# Patient Record
Sex: Female | Born: 1940
Health system: Southern US, Community
[De-identification: ages and names within clinical notes are randomized; demographics above are authoritative.]

## PROBLEM LIST (undated history)

## (undated) DIAGNOSIS — I82409 Acute embolism and thrombosis of unspecified deep veins of unspecified lower extremity: Secondary | ICD-10-CM

## (undated) DIAGNOSIS — H9190 Unspecified hearing loss, unspecified ear: Secondary | ICD-10-CM

## (undated) DIAGNOSIS — F800 Phonological disorder: Secondary | ICD-10-CM

## (undated) DIAGNOSIS — I69354 Hemiplegia and hemiparesis following cerebral infarction affecting left non-dominant side: Secondary | ICD-10-CM

## (undated) DIAGNOSIS — R42 Dizziness and giddiness: Secondary | ICD-10-CM

## (undated) DIAGNOSIS — I499 Cardiac arrhythmia, unspecified: Secondary | ICD-10-CM

## (undated) DIAGNOSIS — N3941 Urge incontinence: Secondary | ICD-10-CM

## (undated) DIAGNOSIS — I10 Essential (primary) hypertension: Secondary | ICD-10-CM

## (undated) DIAGNOSIS — G459 Transient cerebral ischemic attack, unspecified: Secondary | ICD-10-CM

## (undated) DIAGNOSIS — D649 Anemia, unspecified: Secondary | ICD-10-CM

## (undated) DIAGNOSIS — N950 Postmenopausal bleeding: Principal | ICD-10-CM

## (undated) DIAGNOSIS — I480 Paroxysmal atrial fibrillation: Secondary | ICD-10-CM

## (undated) DIAGNOSIS — G8929 Other chronic pain: Secondary | ICD-10-CM

## (undated) DIAGNOSIS — M549 Dorsalgia, unspecified: Secondary | ICD-10-CM

## (undated) DIAGNOSIS — M25569 Pain in unspecified knee: Secondary | ICD-10-CM

## (undated) DIAGNOSIS — M1990 Unspecified osteoarthritis, unspecified site: Secondary | ICD-10-CM

## (undated) DIAGNOSIS — R41841 Cognitive communication deficit: Secondary | ICD-10-CM

## (undated) DIAGNOSIS — I639 Cerebral infarction, unspecified: Secondary | ICD-10-CM

## (undated) DIAGNOSIS — E669 Obesity, unspecified: Secondary | ICD-10-CM

## (undated) DIAGNOSIS — R1310 Dysphagia, unspecified: Secondary | ICD-10-CM

## (undated) DIAGNOSIS — R9389 Abnormal findings on diagnostic imaging of other specified body structures: Secondary | ICD-10-CM

## (undated) DIAGNOSIS — E785 Hyperlipidemia, unspecified: Secondary | ICD-10-CM

## (undated) DIAGNOSIS — F0390 Unspecified dementia without behavioral disturbance: Secondary | ICD-10-CM

## (undated) HISTORY — PX: APPENDECTOMY: SHX54

## (undated) HISTORY — PX: OVARY SURGERY: SHX727

## (undated) HISTORY — DX: Abnormal findings on diagnostic imaging of other specified body structures: R93.89

## (undated) HISTORY — DX: Urge incontinence: N39.41

## (undated) HISTORY — DX: Cerebral infarction, unspecified: I63.9

## (undated) HISTORY — DX: Postmenopausal bleeding: N95.0

## (undated) HISTORY — PX: ENDOMETRIAL ABLATION: SHX621

## (undated) HISTORY — DX: Obesity, unspecified: E66.9

---

## 1999-08-15 ENCOUNTER — Encounter: Payer: Self-pay | Admitting: Emergency Medicine

## 1999-08-15 ENCOUNTER — Emergency Department (HOSPITAL_COMMUNITY): Admission: EM | Admit: 1999-08-15 | Discharge: 1999-08-15 | Payer: Self-pay | Admitting: Emergency Medicine

## 2001-03-14 ENCOUNTER — Emergency Department (HOSPITAL_COMMUNITY): Admission: EM | Admit: 2001-03-14 | Discharge: 2001-03-14 | Payer: Self-pay | Admitting: Emergency Medicine

## 2004-07-07 ENCOUNTER — Emergency Department (HOSPITAL_COMMUNITY): Admission: EM | Admit: 2004-07-07 | Discharge: 2004-07-07 | Payer: Self-pay

## 2005-12-30 ENCOUNTER — Emergency Department (HOSPITAL_COMMUNITY): Admission: EM | Admit: 2005-12-30 | Discharge: 2005-12-30 | Payer: Self-pay | Admitting: Emergency Medicine

## 2006-11-08 ENCOUNTER — Ambulatory Visit (HOSPITAL_COMMUNITY): Admission: RE | Admit: 2006-11-08 | Discharge: 2006-11-08 | Payer: Self-pay | Admitting: Obstetrics

## 2007-12-07 HISTORY — PX: OTHER SURGICAL HISTORY: SHX169

## 2007-12-20 ENCOUNTER — Ambulatory Visit: Payer: Self-pay | Admitting: Nurse Practitioner

## 2007-12-20 DIAGNOSIS — M1710 Unilateral primary osteoarthritis, unspecified knee: Secondary | ICD-10-CM | POA: Insufficient documentation

## 2007-12-20 DIAGNOSIS — IMO0002 Reserved for concepts with insufficient information to code with codable children: Secondary | ICD-10-CM

## 2007-12-20 DIAGNOSIS — I10 Essential (primary) hypertension: Secondary | ICD-10-CM | POA: Insufficient documentation

## 2007-12-20 DIAGNOSIS — D649 Anemia, unspecified: Secondary | ICD-10-CM | POA: Insufficient documentation

## 2007-12-20 DIAGNOSIS — J45909 Unspecified asthma, uncomplicated: Secondary | ICD-10-CM | POA: Insufficient documentation

## 2007-12-20 DIAGNOSIS — E118 Type 2 diabetes mellitus with unspecified complications: Secondary | ICD-10-CM | POA: Insufficient documentation

## 2007-12-20 LAB — CONVERTED CEMR LAB
Blood Glucose, Fingerstick: 109
Hgb A1c MFr Bld: 7 %

## 2007-12-21 LAB — CONVERTED CEMR LAB
ALT: 12 units/L (ref 0–35)
AST: 13 units/L (ref 0–37)
Albumin: 3.7 g/dL (ref 3.5–5.2)
Alkaline Phosphatase: 70 units/L (ref 39–117)
BUN: 22 mg/dL (ref 6–23)
Basophils Absolute: 0 10*3/uL (ref 0.0–0.1)
Basophils Relative: 0 % (ref 0–1)
CO2: 26 meq/L (ref 19–32)
Calcium: 8.9 mg/dL (ref 8.4–10.5)
Chloride: 104 meq/L (ref 96–112)
Creatinine, Ser: 1.14 mg/dL (ref 0.40–1.20)
Eosinophils Absolute: 0.1 10*3/uL (ref 0.0–0.7)
Eosinophils Relative: 2 % (ref 0–5)
Glucose, Bld: 68 mg/dL — ABNORMAL LOW (ref 70–99)
HCT: 34.2 % — ABNORMAL LOW (ref 36.0–46.0)
Hemoglobin: 10.3 g/dL — ABNORMAL LOW (ref 12.0–15.0)
Lymphocytes Relative: 25 % (ref 12–46)
Lymphs Abs: 1.7 10*3/uL (ref 0.7–4.0)
MCHC: 30.1 g/dL (ref 30.0–36.0)
MCV: 99.1 fL (ref 78.0–100.0)
Monocytes Absolute: 0.5 10*3/uL (ref 0.1–1.0)
Monocytes Relative: 8 % (ref 3–12)
Neutro Abs: 4.5 10*3/uL (ref 1.7–7.7)
Neutrophils Relative %: 65 % (ref 43–77)
Platelets: 281 10*3/uL (ref 150–400)
Potassium: 4.2 meq/L (ref 3.5–5.3)
RBC: 3.45 M/uL — ABNORMAL LOW (ref 3.87–5.11)
RDW: 16.2 % — ABNORMAL HIGH (ref 11.5–15.5)
Sodium: 143 meq/L (ref 135–145)
TSH: 1.457 microintl units/mL (ref 0.350–5.50)
Total Bilirubin: 0.3 mg/dL (ref 0.3–1.2)
Total Protein: 7 g/dL (ref 6.0–8.3)
WBC: 6.8 10*3/uL (ref 4.0–10.5)

## 2007-12-25 ENCOUNTER — Telehealth (INDEPENDENT_AMBULATORY_CARE_PROVIDER_SITE_OTHER): Payer: Self-pay | Admitting: Nurse Practitioner

## 2008-01-01 ENCOUNTER — Ambulatory Visit: Payer: Self-pay | Admitting: *Deleted

## 2008-01-03 ENCOUNTER — Ambulatory Visit: Payer: Self-pay | Admitting: Nurse Practitioner

## 2008-01-03 LAB — CONVERTED CEMR LAB: Blood Glucose, Fingerstick: 125

## 2008-01-04 LAB — CONVERTED CEMR LAB
Cholesterol: 264 mg/dL — ABNORMAL HIGH (ref 0–200)
HDL: 56 mg/dL (ref >39)
LDL Cholesterol: 188 mg/dL — ABNORMAL HIGH (ref 0–99)
Total CHOL/HDL Ratio: 4.7
Triglycerides: 100 mg/dL (ref <150)
VLDL: 20 mg/dL (ref 0–40)

## 2008-01-09 ENCOUNTER — Ambulatory Visit: Payer: Self-pay | Admitting: Nurse Practitioner

## 2008-01-18 ENCOUNTER — Ambulatory Visit (HOSPITAL_COMMUNITY): Admission: RE | Admit: 2008-01-18 | Discharge: 2008-01-18 | Payer: Self-pay | Admitting: Nurse Practitioner

## 2008-01-23 ENCOUNTER — Ambulatory Visit: Payer: Self-pay | Admitting: Nurse Practitioner

## 2008-02-02 ENCOUNTER — Ambulatory Visit: Payer: Self-pay | Admitting: Nurse Practitioner

## 2008-02-02 DIAGNOSIS — R809 Proteinuria, unspecified: Secondary | ICD-10-CM | POA: Insufficient documentation

## 2008-02-02 LAB — CONVERTED CEMR LAB
Blood Glucose, Fingerstick: 116
Microalb, Ur: 44.2 mg/dL — ABNORMAL HIGH (ref 0.00–1.89)
Protein, U semiquant: >=300
Specific Gravity, Urine: 1.02
Urobilinogen, UA: 0.2
pH: 6.5

## 2008-02-16 ENCOUNTER — Ambulatory Visit: Payer: Self-pay | Admitting: Nurse Practitioner

## 2008-02-16 LAB — CONVERTED CEMR LAB: Blood Glucose, Fingerstick: 109

## 2008-03-20 ENCOUNTER — Ambulatory Visit: Payer: Self-pay | Admitting: Nurse Practitioner

## 2008-03-20 LAB — CONVERTED CEMR LAB
Bilirubin Urine: NEGATIVE
Blood Glucose, Fingerstick: 144
Glucose, Urine, Semiquant: NEGATIVE
Hgb A1c MFr Bld: 6.4 %
Ketones, urine, test strip: NEGATIVE
Nitrite: NEGATIVE
Protein, U semiquant: 100
Specific Gravity, Urine: 1.025
Urobilinogen, UA: 0.2
pH: 6

## 2008-04-04 ENCOUNTER — Telehealth (INDEPENDENT_AMBULATORY_CARE_PROVIDER_SITE_OTHER): Payer: Self-pay | Admitting: Nurse Practitioner

## 2008-04-24 ENCOUNTER — Encounter (INDEPENDENT_AMBULATORY_CARE_PROVIDER_SITE_OTHER): Payer: Self-pay | Admitting: Nurse Practitioner

## 2008-05-01 ENCOUNTER — Ambulatory Visit: Payer: Self-pay | Admitting: Nurse Practitioner

## 2008-05-02 LAB — CONVERTED CEMR LAB
ALT: 13 units/L (ref 0–35)
AST: 15 units/L (ref 0–37)
Albumin: 3.9 g/dL (ref 3.5–5.2)
Alkaline Phosphatase: 63 units/L (ref 39–117)
BUN: 19 mg/dL (ref 6–23)
Basophils Absolute: 0 10*3/uL (ref 0.0–0.1)
Basophils Relative: 0 % (ref 0–1)
CO2: 25 meq/L (ref 19–32)
Calcium: 8.7 mg/dL (ref 8.4–10.5)
Chloride: 105 meq/L (ref 96–112)
Cholesterol: 233 mg/dL — ABNORMAL HIGH (ref 0–200)
Creatinine, Ser: 0.97 mg/dL (ref 0.40–1.20)
Eosinophils Absolute: 0.1 10*3/uL (ref 0.0–0.7)
Eosinophils Relative: 2 % (ref 0–5)
Glucose, Bld: 109 mg/dL — ABNORMAL HIGH (ref 70–99)
HCT: 36.3 % (ref 36.0–46.0)
HDL: 71 mg/dL (ref >39)
Hemoglobin: 11.2 g/dL — ABNORMAL LOW (ref 12.0–15.0)
LDL Cholesterol: 147 mg/dL — ABNORMAL HIGH (ref 0–99)
Lymphocytes Relative: 34 % (ref 12–46)
Lymphs Abs: 1.5 10*3/uL (ref 0.7–4.0)
MCHC: 30.9 g/dL (ref 30.0–36.0)
MCV: 101.7 fL — ABNORMAL HIGH (ref 78.0–100.0)
Monocytes Absolute: 0.3 10*3/uL (ref 0.1–1.0)
Monocytes Relative: 7 % (ref 3–12)
Neutro Abs: 2.6 10*3/uL (ref 1.7–7.7)
Neutrophils Relative %: 57 % (ref 43–77)
Platelets: 226 10*3/uL (ref 150–400)
Potassium: 4.4 meq/L (ref 3.5–5.3)
RBC: 3.57 M/uL — ABNORMAL LOW (ref 3.87–5.11)
RDW: 15.1 % (ref 11.5–15.5)
Sodium: 141 meq/L (ref 135–145)
Total Bilirubin: 0.3 mg/dL (ref 0.3–1.2)
Total CHOL/HDL Ratio: 3.3
Total Protein: 6.9 g/dL (ref 6.0–8.3)
Triglycerides: 74 mg/dL (ref <150)
VLDL: 15 mg/dL (ref 0–40)
WBC: 4.5 10*3/uL (ref 4.0–10.5)

## 2008-06-20 ENCOUNTER — Telehealth (INDEPENDENT_AMBULATORY_CARE_PROVIDER_SITE_OTHER): Payer: Self-pay | Admitting: *Deleted

## 2008-07-04 ENCOUNTER — Ambulatory Visit: Payer: Self-pay | Admitting: Nurse Practitioner

## 2008-07-04 DIAGNOSIS — S4360XA Sprain of unspecified sternoclavicular joint, initial encounter: Secondary | ICD-10-CM | POA: Insufficient documentation

## 2008-07-04 LAB — CONVERTED CEMR LAB: Blood Glucose, Fingerstick: 121

## 2008-07-31 ENCOUNTER — Telehealth (INDEPENDENT_AMBULATORY_CARE_PROVIDER_SITE_OTHER): Payer: Self-pay | Admitting: Nurse Practitioner

## 2008-08-01 ENCOUNTER — Inpatient Hospital Stay (HOSPITAL_COMMUNITY): Admission: EM | Admit: 2008-08-01 | Discharge: 2008-08-04 | Payer: Self-pay | Admitting: Emergency Medicine

## 2008-08-02 ENCOUNTER — Telehealth (INDEPENDENT_AMBULATORY_CARE_PROVIDER_SITE_OTHER): Payer: Self-pay | Admitting: Nurse Practitioner

## 2008-08-04 ENCOUNTER — Encounter (INDEPENDENT_AMBULATORY_CARE_PROVIDER_SITE_OTHER): Payer: Self-pay | Admitting: Nurse Practitioner

## 2008-08-13 ENCOUNTER — Telehealth (INDEPENDENT_AMBULATORY_CARE_PROVIDER_SITE_OTHER): Payer: Self-pay | Admitting: Nurse Practitioner

## 2008-08-21 ENCOUNTER — Ambulatory Visit: Payer: Self-pay | Admitting: Nurse Practitioner

## 2008-08-21 DIAGNOSIS — M25519 Pain in unspecified shoulder: Secondary | ICD-10-CM | POA: Insufficient documentation

## 2008-08-21 DIAGNOSIS — E119 Type 2 diabetes mellitus without complications: Secondary | ICD-10-CM | POA: Insufficient documentation

## 2008-08-21 LAB — CONVERTED CEMR LAB
ALT: 20 units/L (ref 0–35)
AST: 19 units/L (ref 0–37)
Albumin: 3.9 g/dL (ref 3.5–5.2)
Alkaline Phosphatase: 65 units/L (ref 39–117)
BUN: 24 mg/dL — ABNORMAL HIGH (ref 6–23)
Basophils Absolute: 0 10*3/uL (ref 0.0–0.1)
Basophils Relative: 0 % (ref 0–1)
Blood Glucose, Fingerstick: 224
CO2: 26 meq/L (ref 19–32)
Calcium: 8.8 mg/dL (ref 8.4–10.5)
Chloride: 107 meq/L (ref 96–112)
Creatinine, Ser: 1.03 mg/dL (ref 0.40–1.20)
Eosinophils Absolute: 0.1 10*3/uL (ref 0.0–0.7)
Eosinophils Relative: 3 % (ref 0–5)
Glucose, Bld: 117 mg/dL — ABNORMAL HIGH (ref 70–99)
HCT: 35.7 % — ABNORMAL LOW (ref 36.0–46.0)
Hemoglobin: 10.7 g/dL — ABNORMAL LOW (ref 12.0–15.0)
Hgb A1c MFr Bld: 6.5 %
Lymphocytes Relative: 44 % (ref 12–46)
Lymphs Abs: 1.3 10*3/uL (ref 0.7–4.0)
MCHC: 30 g/dL (ref 30.0–36.0)
MCV: 101.7 fL — ABNORMAL HIGH (ref 78.0–100.0)
Monocytes Absolute: 0.4 10*3/uL (ref 0.1–1.0)
Monocytes Relative: 14 % — ABNORMAL HIGH (ref 3–12)
Neutro Abs: 1.2 10*3/uL — ABNORMAL LOW (ref 1.7–7.7)
Neutrophils Relative %: 40 % — ABNORMAL LOW (ref 43–77)
Platelets: 268 10*3/uL (ref 150–400)
Potassium: 4.9 meq/L (ref 3.5–5.3)
RBC: 3.51 M/uL — ABNORMAL LOW (ref 3.87–5.11)
RDW: 14.9 % (ref 11.5–15.5)
Sodium: 145 meq/L (ref 135–145)
Total Bilirubin: 0.3 mg/dL (ref 0.3–1.2)
Total Protein: 7.2 g/dL (ref 6.0–8.3)
WBC: 2.9 10*3/uL — ABNORMAL LOW (ref 4.0–10.5)

## 2008-08-22 ENCOUNTER — Encounter (INDEPENDENT_AMBULATORY_CARE_PROVIDER_SITE_OTHER): Payer: Self-pay | Admitting: Nurse Practitioner

## 2008-08-22 LAB — CONVERTED CEMR LAB: Retic Ct Pct: 1.6 % (ref 0.4–3.1)

## 2008-08-28 ENCOUNTER — Ambulatory Visit (HOSPITAL_COMMUNITY): Admission: RE | Admit: 2008-08-28 | Discharge: 2008-08-28 | Payer: Self-pay | Admitting: Family Medicine

## 2008-08-28 DIAGNOSIS — M719 Bursopathy, unspecified: Secondary | ICD-10-CM

## 2008-08-28 DIAGNOSIS — M67919 Unspecified disorder of synovium and tendon, unspecified shoulder: Secondary | ICD-10-CM | POA: Insufficient documentation

## 2008-09-04 ENCOUNTER — Ambulatory Visit: Payer: Self-pay | Admitting: Nurse Practitioner

## 2008-09-04 LAB — CONVERTED CEMR LAB: Blood Glucose, Fingerstick: 85

## 2008-09-06 ENCOUNTER — Telehealth (INDEPENDENT_AMBULATORY_CARE_PROVIDER_SITE_OTHER): Payer: Self-pay | Admitting: Nurse Practitioner

## 2008-09-12 ENCOUNTER — Encounter (INDEPENDENT_AMBULATORY_CARE_PROVIDER_SITE_OTHER): Payer: Self-pay | Admitting: Nurse Practitioner

## 2008-09-16 ENCOUNTER — Encounter (INDEPENDENT_AMBULATORY_CARE_PROVIDER_SITE_OTHER): Payer: Self-pay | Admitting: Nurse Practitioner

## 2008-09-16 ENCOUNTER — Encounter: Admission: RE | Admit: 2008-09-16 | Discharge: 2008-10-10 | Payer: Self-pay | Admitting: Nurse Practitioner

## 2008-09-18 ENCOUNTER — Ambulatory Visit: Payer: Self-pay | Admitting: Nurse Practitioner

## 2008-10-14 ENCOUNTER — Encounter (INDEPENDENT_AMBULATORY_CARE_PROVIDER_SITE_OTHER): Payer: Self-pay | Admitting: Nurse Practitioner

## 2008-10-15 ENCOUNTER — Encounter (INDEPENDENT_AMBULATORY_CARE_PROVIDER_SITE_OTHER): Payer: Self-pay | Admitting: Nurse Practitioner

## 2008-10-25 ENCOUNTER — Encounter (INDEPENDENT_AMBULATORY_CARE_PROVIDER_SITE_OTHER): Payer: Self-pay | Admitting: Nurse Practitioner

## 2008-11-02 ENCOUNTER — Emergency Department (HOSPITAL_COMMUNITY): Admission: EM | Admit: 2008-11-02 | Discharge: 2008-11-02 | Payer: Self-pay | Admitting: Emergency Medicine

## 2008-11-04 ENCOUNTER — Telehealth (INDEPENDENT_AMBULATORY_CARE_PROVIDER_SITE_OTHER): Payer: Self-pay | Admitting: Nurse Practitioner

## 2008-11-07 ENCOUNTER — Ambulatory Visit: Payer: Self-pay | Admitting: Nurse Practitioner

## 2008-11-07 DIAGNOSIS — B029 Zoster without complications: Secondary | ICD-10-CM | POA: Insufficient documentation

## 2008-11-07 LAB — CONVERTED CEMR LAB
Basophils Absolute: 0 10*3/uL (ref 0.0–0.1)
Basophils Relative: 0 % (ref 0–1)
Blood Glucose, Fingerstick: 150
Eosinophils Absolute: 0.1 10*3/uL (ref 0.0–0.7)
Eosinophils Relative: 1 % (ref 0–5)
HCT: 37.6 % (ref 36.0–46.0)
Hemoglobin: 11.5 g/dL — ABNORMAL LOW (ref 12.0–15.0)
Hgb A1c MFr Bld: 5.7 %
Lymphocytes Relative: 34 % (ref 12–46)
Lymphs Abs: 1.5 10*3/uL (ref 0.7–4.0)
MCHC: 30.6 g/dL (ref 30.0–36.0)
MCV: 100.3 fL — ABNORMAL HIGH (ref 78.0–100.0)
Monocytes Absolute: 0.4 10*3/uL (ref 0.1–1.0)
Monocytes Relative: 9 % (ref 3–12)
Neutro Abs: 2.5 10*3/uL (ref 1.7–7.7)
Neutrophils Relative %: 56 % (ref 43–77)
Platelets: 243 10*3/uL (ref 150–400)
RBC: 3.75 M/uL — ABNORMAL LOW (ref 3.87–5.11)
RDW: 14.8 % (ref 11.5–15.5)
WBC: 4.5 10*3/uL (ref 4.0–10.5)

## 2008-11-12 ENCOUNTER — Encounter (INDEPENDENT_AMBULATORY_CARE_PROVIDER_SITE_OTHER): Payer: Self-pay | Admitting: Nurse Practitioner

## 2009-02-04 ENCOUNTER — Telehealth (INDEPENDENT_AMBULATORY_CARE_PROVIDER_SITE_OTHER): Payer: Self-pay | Admitting: Nurse Practitioner

## 2009-02-12 ENCOUNTER — Ambulatory Visit: Payer: Self-pay | Admitting: Nurse Practitioner

## 2009-02-12 LAB — CONVERTED CEMR LAB
Blood Glucose, Fingerstick: 113
Cholesterol, target level: 200 mg/dL
HDL goal, serum: 40 mg/dL
Hgb A1c MFr Bld: 6.5 %
LDL Goal: 100 mg/dL

## 2009-02-26 ENCOUNTER — Ambulatory Visit: Payer: Self-pay | Admitting: Nurse Practitioner

## 2009-02-26 LAB — CONVERTED CEMR LAB
Bilirubin Urine: NEGATIVE
Blood in Urine, dipstick: NEGATIVE
Glucose, Urine, Semiquant: NEGATIVE
Ketones, urine, test strip: NEGATIVE
Nitrite: NEGATIVE
Protein, U semiquant: 100
Specific Gravity, Urine: 1.02
Urobilinogen, UA: 1
WBC Urine, dipstick: NEGATIVE
pH: 7

## 2009-02-27 ENCOUNTER — Encounter (INDEPENDENT_AMBULATORY_CARE_PROVIDER_SITE_OTHER): Payer: Self-pay | Admitting: Nurse Practitioner

## 2009-02-27 LAB — CONVERTED CEMR LAB
ALT: 17 units/L (ref 0–35)
AST: 17 units/L (ref 0–37)
Albumin: 3.9 g/dL (ref 3.5–5.2)
Alkaline Phosphatase: 71 units/L (ref 39–117)
BUN: 17 mg/dL (ref 6–23)
Basophils Absolute: 0 10*3/uL (ref 0.0–0.1)
Basophils Relative: 0 % (ref 0–1)
CO2: 26 meq/L (ref 19–32)
Calcium: 8.8 mg/dL (ref 8.4–10.5)
Chloride: 104 meq/L (ref 96–112)
Cholesterol: 186 mg/dL (ref 0–200)
Creatinine, Ser: 0.89 mg/dL (ref 0.40–1.20)
Eosinophils Absolute: 0.1 10*3/uL (ref 0.0–0.7)
Eosinophils Relative: 1 % (ref 0–5)
Glucose, Bld: 78 mg/dL (ref 70–99)
HCT: 38.9 % (ref 36.0–46.0)
HDL: 76 mg/dL (ref >39)
Hemoglobin: 12 g/dL (ref 12.0–15.0)
LDL Cholesterol: 97 mg/dL (ref 0–99)
Lymphocytes Relative: 32 % (ref 12–46)
Lymphs Abs: 1.3 10*3/uL (ref 0.7–4.0)
MCHC: 30.8 g/dL (ref 30.0–36.0)
MCV: 104.3 fL — ABNORMAL HIGH (ref 78.0–100.0)
Microalb, Ur: 18.53 mg/dL — ABNORMAL HIGH (ref 0.00–1.89)
Monocytes Absolute: 0.3 10*3/uL (ref 0.1–1.0)
Monocytes Relative: 8 % (ref 3–12)
Neutro Abs: 2.5 10*3/uL (ref 1.7–7.7)
Neutrophils Relative %: 59 % (ref 43–77)
Platelets: 230 10*3/uL (ref 150–400)
Potassium: 4.3 meq/L (ref 3.5–5.3)
RBC: 3.73 M/uL — ABNORMAL LOW (ref 3.87–5.11)
RDW: 14.4 % (ref 11.5–15.5)
Sodium: 141 meq/L (ref 135–145)
Total Bilirubin: 0.4 mg/dL (ref 0.3–1.2)
Total CHOL/HDL Ratio: 2.4
Total Protein: 6.5 g/dL (ref 6.0–8.3)
Triglycerides: 64 mg/dL (ref <150)
VLDL: 13 mg/dL (ref 0–40)
WBC: 4.2 10*3/uL (ref 4.0–10.5)

## 2009-05-09 ENCOUNTER — Ambulatory Visit: Payer: Self-pay | Admitting: Nurse Practitioner

## 2009-05-09 DIAGNOSIS — M21619 Bunion of unspecified foot: Secondary | ICD-10-CM | POA: Insufficient documentation

## 2009-05-09 DIAGNOSIS — E7800 Pure hypercholesterolemia, unspecified: Secondary | ICD-10-CM | POA: Insufficient documentation

## 2009-05-09 DIAGNOSIS — J329 Chronic sinusitis, unspecified: Secondary | ICD-10-CM | POA: Insufficient documentation

## 2009-05-09 LAB — CONVERTED CEMR LAB
Basophils Absolute: 0 10*3/uL (ref 0.0–0.1)
Basophils Relative: 0 % (ref 0–1)
Blood Glucose, Fingerstick: 106
Eosinophils Absolute: 0.1 10*3/uL (ref 0.0–0.7)
Eosinophils Relative: 3 % (ref 0–5)
HCT: 40.5 % (ref 36.0–46.0)
Hemoglobin: 13.1 g/dL (ref 12.0–15.0)
Lymphocytes Relative: 30 % (ref 12–46)
Lymphs Abs: 1.4 10*3/uL (ref 0.7–4.0)
MCHC: 32.3 g/dL (ref 30.0–36.0)
MCV: 99.5 fL (ref 78.0–100.0)
Monocytes Absolute: 0.4 10*3/uL (ref 0.1–1.0)
Monocytes Relative: 9 % (ref 3–12)
Neutro Abs: 2.6 10*3/uL (ref 1.7–7.7)
Neutrophils Relative %: 58 % (ref 43–77)
Platelets: 195 10*3/uL (ref 150–400)
RBC: 4.07 M/uL (ref 3.87–5.11)
RDW: 13.2 % (ref 11.5–15.5)
WBC: 4.5 10*3/uL (ref 4.0–10.5)

## 2009-05-12 ENCOUNTER — Encounter (INDEPENDENT_AMBULATORY_CARE_PROVIDER_SITE_OTHER): Payer: Self-pay | Admitting: Nurse Practitioner

## 2009-05-13 ENCOUNTER — Encounter (INDEPENDENT_AMBULATORY_CARE_PROVIDER_SITE_OTHER): Payer: Self-pay | Admitting: Nurse Practitioner

## 2009-05-14 ENCOUNTER — Ambulatory Visit: Payer: Self-pay | Admitting: Nurse Practitioner

## 2009-05-15 LAB — CONVERTED CEMR LAB
Cholesterol: 241 mg/dL — ABNORMAL HIGH (ref 0–200)
HDL: 71 mg/dL (ref >39)
LDL Cholesterol: 155 mg/dL — ABNORMAL HIGH (ref 0–99)
Total CHOL/HDL Ratio: 3.4
Triglycerides: 75 mg/dL (ref <150)
VLDL: 15 mg/dL (ref 0–40)

## 2009-05-19 ENCOUNTER — Telehealth (INDEPENDENT_AMBULATORY_CARE_PROVIDER_SITE_OTHER): Payer: Self-pay | Admitting: Nurse Practitioner

## 2010-03-17 ENCOUNTER — Inpatient Hospital Stay (HOSPITAL_COMMUNITY): Admission: EM | Admit: 2010-03-17 | Discharge: 2010-03-22 | Payer: Self-pay | Admitting: Emergency Medicine

## 2010-03-17 ENCOUNTER — Ambulatory Visit: Payer: Self-pay | Admitting: Cardiovascular Disease

## 2010-03-18 ENCOUNTER — Encounter (INDEPENDENT_AMBULATORY_CARE_PROVIDER_SITE_OTHER): Payer: Self-pay | Admitting: Internal Medicine

## 2010-03-18 ENCOUNTER — Ambulatory Visit: Payer: Self-pay | Admitting: Internal Medicine

## 2010-04-01 ENCOUNTER — Encounter: Payer: Self-pay | Admitting: Internal Medicine

## 2010-04-02 ENCOUNTER — Ambulatory Visit: Admission: RE | Admit: 2010-04-02 | Discharge: 2010-04-02 | Payer: Self-pay | Admitting: Neurology

## 2010-08-07 ENCOUNTER — Telehealth (INDEPENDENT_AMBULATORY_CARE_PROVIDER_SITE_OTHER): Payer: Self-pay | Admitting: Nurse Practitioner

## 2010-08-12 ENCOUNTER — Emergency Department (HOSPITAL_COMMUNITY): Admission: EM | Admit: 2010-08-12 | Discharge: 2010-08-12 | Payer: Self-pay | Admitting: Emergency Medicine

## 2010-12-27 ENCOUNTER — Encounter: Payer: Self-pay | Admitting: Obstetrics

## 2011-01-05 NOTE — Letter (Signed)
Summary: DIABETIC TESTING SUPPLIES  DIABETIC TESTING SUPPLIES   Imported By: Arta Bruce 04/30/2008 16:45:54  _____________________________________________________________________  External Attachment:    Type:   Image     Comment:   External Document

## 2011-01-05 NOTE — Progress Notes (Signed)
Summary: NEEDS SOMETHING FOR PAIN  Phone Note Call from Patient Call back at Home Phone [phone]   Reason for Call: Refill Medication Summary of Call: [name] PT. [name] WANT S TO KNOW IF YOU WOULD CALL IN SOMETHING FOR HER LEGS FOR PAIN. SHE SAYS SHE CAN HARDLY WALK. THE PAIN MEDICATION THAT SHE WANTS A REFILL ON, CAME FROM THE DR SHE SAW IN [place], ITS CALLED MELOXICAM 15mg . [name] SAYS OR IF YOU CAN GIVE HER SOMETHING UNTIL SHE SEES YOU ON THE 10th. SHE USES [facility] ON [address]. Initial call taken by: Leodis Rains,  February 04, 2009 8:54 AM  Follow-up for Phone Call        forward to N.Daphine Deutscher, FNP Follow-up by: Levon Hedger,  February 04, 2009 11:14 AM  Additional Follow-up for Phone Call Additional follow up Details #1::        Meloxicam sent to the pharmacy. Additional Follow-up by: Lehman Prom FNP,  February 04, 2009 11:28 AM    Additional Follow-up for Phone Call Additional follow up Details #2::    pt into the office today. will review meds with the pt Follow-up by: Lehman Prom FNP,  February 12, 2009 9:27 AM  New/Updated Medications: MELOXICAM 15 MG TABS (MELOXICAM) One tablet by mouth daily for pain   Prescriptions: MELOXICAM 15 MG TABS (MELOXICAM) One tablet by mouth daily for pain  #30 x 3   Entered and Authorized by:   Lehman Prom FNP   Signed by:   Lehman Prom FNP on 02/04/2009   Method used:   Electronically to        [facility] [phone] (retail)       [address]       [place], [place]  [address]       Ph: [phone]       Fax: [phone]   RxID:   [id number]

## 2011-01-05 NOTE — Letter (Signed)
Summary: INFLUENZA,VACCINATION  INFLUENZA,VACCINATION   Imported By: Arta Bruce 12/21/2007 10:21:09  _____________________________________________________________________  External Attachment:    Type:   Image     Comment:   External Document

## 2011-01-05 NOTE — Assessment & Plan Note (Signed)
Summary: Diabetes/HTN   Vital Signs:  Patient profile:   70 year old female Height:      58 inches Weight:      298 pounds BMI:     62.51 BSA:     2.16 Temp:     97.6 degrees F oral Pulse rate:   64 / minute Pulse rhythm:   regular Resp:     20 per minute BP sitting:   122 / 80  (left arm) Cuff size:   large  Vitals Entered By: Levon Hedger (February 12, 2009 8:56 AM) CC: Lipid Management Is Patient Diabetic? Yes  CBG Result 113 CBG Device ID B  Does patient need assistance? Functional Status Self care Ambulation Normal   History of Present Illness:  Pt into the office for follow up.  Diabetes - Checking blood sugar at least once per day.  Tobacco - pt had quit but she has restarted. She would like to quit again and reports that she will.    Hypertension History:      She denies headache, chest pain, and palpitations.  Further comments include: pt is taking her blood pressure medications as ordered.        Positive major cardiovascular risk factors include female age 68 years old or older, diabetes, and hypertension.  Negative major cardiovascular risk factors include non-tobacco-user status.        Further assessment for target organ damage reveals no history of ASHD, cardiac end-organ damage (CHF/LVH), stroke/TIA, peripheral vascular disease, renal insufficiency, or hypertensive retinopathy.    Lipid Management History:      Positive NCEP/ATP III risk factors include female age 68 years old or older, diabetes, and hypertension.  Negative NCEP/ATP III risk factors include no history of early menopause without estrogen hormone replacement, HDL cholesterol greater than 60, non-tobacco-user status, no ASHD (atherosclerotic heart disease), no prior stroke/TIA, no peripheral vascular disease, and no history of aortic aneurysm.        She expresses no side effects from her lipid-lowering medication.  The patient denies any symptoms to suggest myopathy or liver disease.   Comments: pt has not been taking her cholesterol medications as she completed her supply.     Medications Prior to Update: 1)  Janumet 50-500 Mg  Tabs (Sitagliptin-Metformin Hcl) .Marland Kitchen.. 1 Tablet By Mouth Twice Daily For Blood Sugar 2)  Ultram 50 Mg  Tabs (Tramadol Hcl) .Marland Kitchen.. 1 Tablet By Mouth Two Times A Day For Pain As Needed 3)  Proventil Hfa 108 (90 Base) Mcg/act  Aers (Albuterol Sulfate) .... 2 Puffs Every 6 Hours As Needed For Shortness of Breath 4)  Glucometer Elite Classic   Kit (Blood Glucose Monitoring Suppl) .... Dispense Glucometer (Whatever Available) Along With Test Strips, and Lancets.  Dx 250.00 Check Blood Sugar Once Daily. 5)  Norvasc 10 Mg  Tabs (Amlodipine Besylate) .Marland Kitchen.. 1 Tablet By Mouth Daily For Blood Pressure 6)  Aspirin 81 Mg  Tbec (Aspirin) .Marland Kitchen.. 1 Tablet By Mouth Daily 7)  Lipitor 20 Mg  Tabs (Atorvastatin Calcium) .Marland Kitchen.. 1 Tablet By Mouth At Night For Cholesterol 8)  Flexeril 5 Mg  Tabs (Cyclobenzaprine Hcl) .Marland Kitchen.. 1 Tablet By Mouth Nightly As Needed For Muscles 9)  Flector 1.3 %  Ptch (Diclofenac Epolamine) .... Apply 1 Patch To Affected Area On For 12 Hours Then Off For 12 Hours 10)  Benicar 20 Mg Tabs (Olmesartan Medoxomil) .Marland Kitchen.. 1 Tablet By Mouth Daily For Blood Pressure 11)  Ferrous Sulfate 325 (65 Fe) Mg Tbec (Ferrous  Sulfate) .... Take One Tablet Twice A Day 12)  Folic Acid 1 Mg Tabs (Folic Acid) .... Take One Tablet By Mouth Every Day 13)  Colace 100 Mg Caps (Docusate Sodium) .... Take One Tablet Two Time A Day 14)  Acyclovir 800 Mg Tabs (Acyclovir) .... Take One Tablet By Mouth Every 6hours For 7 Days 15)  Sarna Ultra 1-0.5-30 % Crea (Pramoxine-Menthol-Petrolatum) 16)  Meloxicam 15 Mg Tabs (Meloxicam) .... One Tablet By Mouth Daily For Pain  Allergies (verified): No Known Drug Allergies  Review of Systems CV:  Denies chest pain or discomfort. Resp:  Denies cough. GI:  Denies abdominal pain, nausea, and vomiting. MS:  Complains of joint pain; left knee pain -  improved since she restarted taking the meloxicam.  Physical Exam  General:  alert.   Head:  normocephalic.   Eyes:  glasses Lungs:  normal breath sounds.   Heart:  normal rate and regular rhythm.   Abdomen:  obese Msk:  left knee with decreased ROM Pulses:  DP +2 Extremities:  no edema Neurologic:  alert & oriented X3.    Diabetes Management Exam:    Foot Exam (with socks and/or shoes not present):       Sensory-Monofilament:          Left foot: normal          Right foot: normal   Impression & Recommendations:  Problem # 1:  DIABETES MELLITUS, CONTROLLED (ICD-250.00) advised pt to get eye exam hgba1c is 6.5 today which is higher than previous check advised pt to monitor diet and to increase activity Her updated medication list for this problem includes:    Janumet 50-500 Mg Tabs (Sitagliptin-metformin hcl) .Marland Kitchen... 1 tablet by mouth twice daily for blood sugar    Aspirin 81 Mg Tbec (Aspirin) .Marland Kitchen... 1 tablet by mouth daily    Benicar 20 Mg Tabs (Olmesartan medoxomil) .Marland Kitchen... 1 tablet by mouth daily for blood pressure  Orders: Capillary Blood Glucose (82948) Fingerstick (16[address])  Problem # 2:  ESSENTIAL HYPERTENSION, BENIGN (ICD-401.1) Pt has NOT been taking the norvasc in the past 2 weeks. BP is stable today advised her to restart the benicar and hold norvasc Her updated medication list for this problem includes:    Norvasc 10 Mg Tabs (Amlodipine besylate) .Marland Kitchen... 1 tablet by mouth daily for blood pressure    Benicar 20 Mg Tabs (Olmesartan medoxomil) .Marland Kitchen... 1 tablet by mouth daily for blood pressure  Problem # 3:  ANEMIA (ICD-285.9) will check cbc on the next visit pt has NOT been taking her iron tablet. advised her to restart Her updated medication list for this problem includes:    Ferrous Sulfate 325 (65 Fe) Mg Tbec (Ferrous sulfate) .Marland Kitchen... Take one tablet twice a day    Folic Acid 1 Mg Tabs (Folic acid) .Marland Kitchen... Take one tablet by mouth every day  Problem # 4:  ASTHMA  (ICD-493.90) advised pt that smoking will increase the symptoms she reports that she will quit again Her updated medication list for this problem includes:    Proventil Hfa 108 (90 Base) Mcg/act Aers (Albuterol sulfate) .Marland Kitchen... 2 puffs every 6 hours as needed for shortness of breath  Complete Medication List: 1)  Janumet 50-500 Mg Tabs (Sitagliptin-metformin hcl) .Marland Kitchen.. 1 tablet by mouth twice daily for blood sugar 2)  Ultram 50 Mg Tabs (Tramadol hcl) .Marland Kitchen.. 1 tablet by mouth two times a day for pain as needed 3)  Proventil Hfa 108 (90 Base) Mcg/act Aers (Albuterol sulfate) .Marland KitchenMarland KitchenMarland Kitchen  2 puffs every 6 hours as needed for shortness of breath 4)  Glucometer Elite Classic Kit (Blood glucose monitoring suppl) .... Dispense glucometer (whatever available) along with test strips, and lancets.  dx 250.00 check blood sugar once daily. 5)  Norvasc 10 Mg Tabs (Amlodipine besylate) .Marland Kitchen.. 1 tablet by mouth daily for blood pressure 6)  Aspirin 81 Mg Tbec (Aspirin) .Marland Kitchen.. 1 tablet by mouth daily 7)  Lipitor 20 Mg Tabs (Atorvastatin calcium) .Marland Kitchen.. 1 tablet by mouth at night for cholesterol 8)  Benicar 20 Mg Tabs (Olmesartan medoxomil) .Marland Kitchen.. 1 tablet by mouth daily for blood pressure 9)  Ferrous Sulfate 325 (65 Fe) Mg Tbec (Ferrous sulfate) .... Take one tablet twice a day 10)  Folic Acid 1 Mg Tabs (Folic acid) .... Take one tablet by mouth every day 11)  Sarna Ultra 1-0.5-30 % Crea (Pramoxine-menthol-petrolatum) 12)  Meloxicam 15 Mg Tabs (Meloxicam) .... One tablet by mouth daily for pain  Other Orders: Hgb A1C (60454UJ)  Hypertension Assessment/Plan:      The patient's hypertensive risk group is category C: Target organ damage and/or diabetes.  Her calculated 10 year risk of coronary heart disease is 13 %.  Today's blood pressure is 122/80.  Her blood pressure goal is < 130/80.  Lipid Assessment/Plan:      Based on NCEP/ATP III, the patient's risk factor category is "history of diabetes".  From this information, the  patient's calculated lipid goals are as follows: Total cholesterol goal is 200; LDL cholesterol goal is 100; HDL cholesterol goal is 40; Triglyceride goal is 150.    Patient Instructions: 1)  You have gained 2 pounds since the last visit. 2)  Diabetes - Your A!C is up to 6.5 today.  It was 5.6 in December 2009.  This means that your blood sugar has been a little higher then in the previous month. 3)  Follow up in 2 weeks for blood pressure check. 4)  Restart the Benicar 20mg  by mouth daily 5)  HOLD the amlodipine 10mg  for now. 6)  Follow up in 3 months - June 2010.   7)  You will be labs on the next visit, so do not eat after midnight before this visit. 8)  You will get cbc, cmp, lipids, u/a, microalbumin. 9)  The medication list was reviewed and reconciled.  All changed / newly prescribed medications were explained.  A complete medication list was provided to the patient / caregiver.        Last LDL:                                                 147 (05/01/2008 9:55:00 PM)        Diabetic Foot Exam    10-g (5.07) Semmes-Weinstein Monofilament Test Performed by: Levon Hedger          Right Foot          Left Foot Visual Inspection               Test Control      normal         normal Site 1         normal         normal Site 2         normal         normal Site 3  normal         normal Site 4         normal         normal Site 5         normal         normal Site 6         normal         normal Site 7         normal         normal Site 8         normal         normal Site 9         normal         normal Site 10         normal         normal  Impression      normal         normal   Laboratory Results   Blood Tests   Date/Time Received: February 12, 2009 9:13 AM  Date/Time Reported: February 12, 2009 9:13 AM   HGBA1C: 6.5%   (Normal Range: Non-Diabetic - 3-6%   Control Diabetic - 6-8%) CBG Random:: 113

## 2011-01-05 NOTE — Progress Notes (Signed)
Summary: PT UPDATE  Phone Note Call from Patient Call back at Home Phone [phone]   Reason for Call: Talk to Nurse Summary of Call: MARTIN PT.  PT CALLED TO INFORM BRENDA THAT SHE WAS ADMITTED TO HOSPITAL YESTERDAY.  CAN CALL HER AT [place] FOR MORE INFORMATION. SHE IS ON THE 4TH FLOOR. Initial call taken by: Deirdre Priest,  August 02, 2008 12:04 PM  Follow-up for Phone Call        Spoke with pt she was d/c  on Sunday she had bad UTI and is on antibx still, advised to make sure she takes all of her meds even if she starts feeling better.  Pt's phone then messed up and I was unable to understand anything she said, called back and got voicemail and left message to f/u with [place] after meds completed if she still did not feel better. Follow-up by: Vesta Mixer CMA,  August 06, 2008 12:10 PM  Additional Follow-up for Phone Call Additional follow up Details #1::        hospital record printed for your review Additional Follow-up by: Gaylyn Cheers RN,  August 06, 2008 12:18 PM    Additional Follow-up for Phone Call Additional follow up Details #2::    reading d/c papers now. 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  No acute Abnormality

## 2011-01-05 NOTE — Assessment & Plan Note (Signed)
Summary: Acute - Asthma   Vital Signs:  Patient Profile:   70 Years Old Female Height:     58 inches Weight:      306 pounds O2 Sat:      95 % O2 treatment:    RA Temp:     97.1 degrees F oral Pulse rate:   80 / minute Pulse rhythm:   regular Resp:     20 per minute BP sitting:   138 / 86  (left arm) Cuff size:   large  Pt. in pain?   yes    Location:   leg  Vitals Entered By: Levon Hedger (February 16, 2008 8:45 AM)              Is Patient Diabetic? Yes  CBG Result 109 CBG Device ID B  Does patient need assistance? Ambulation Normal Comments pt states she has been taking NyQuil and has been taking for about 2 days     PCP:  Daphine Deutscher  Chief Complaint:  2 week follow-up for blood sugar and cough.  History of Present Illness:  Blood pressure check - meds were changed on last visit.  She has been taking the new meds as ordered.  She is tolerating the meds well.   Cough - Pt started with cough about 1 week ago.  She has been taking nyquil ad mucinex at home.   she also has asthma.  She notes that she has been wheezes. She has been using her inhaler only in the morning. She has had a productive cough. She is getting short of breath with her daily activities.    Updated Prior Medication List: JANUMET 50-500 MG  TABS (SITAGLIPTIN-METFORMIN HCL) 1 tablet by mouth daily for blood sugar ULTRAM 50 MG  TABS (TRAMADOL HCL) 1 tablet by mouth two times a day for pain as needed CELEBREX 200 MG  CAPS (CELECOXIB) 1 tablet by mouth daily for knee PROVENTIL HFA 108 (90 BASE) MCG/ACT  AERS (ALBUTEROL SULFATE) 2 puffs every 6 hours as needed for shortness of breath GLUCOMETER ELITE CLASSIC   KIT (BLOOD GLUCOSE MONITORING SUPPL) Dispense glucometer (whatever available) along with test strips, and lancets.  Dx 250.00 Check blood sugar once daily. MULTIVITAMIN/IRON   TABS (MULTIPLE VITAMINS-IRON) 1 tablet by mouth daily to build up blood PRAVACHOL 40 MG  TABS (PRAVASTATIN SODIUM) 1  tablet by mouth nightly for cholesterol AVALIDE 150-12.5 MG  TABS (IRBESARTAN-HYDROCHLOROTHIAZIDE) 1 tablet by mouth daily for blood pressure NORVASC 10 MG  TABS (AMLODIPINE BESYLATE) 1 tablet by mouth daily for blood pressure ASPIRIN 81 MG  TBEC (ASPIRIN) 1 tablet by mouth daily AMLODIPINE BESYLATE 10 MG  TABS (AMLODIPINE BESYLATE) once daily  Current Allergies: No known allergies     Risk Factors: Tobacco use:  current Drug use:  no Caffeine use:  3 drinks per day Alcohol use:  yes    Type:  alcohol Exercise:  no   Review of Systems  CV      Complains of shortness of breath with exertion.      Denies chest pain or discomfort.  Resp      Complains of cough and shortness of breath.  GI      Denies abdominal pain, nausea, and vomiting.  MS      Complains of joint pain.      knee pain   Physical Exam  General:     alert and overweight-appearing.   Head:     normocephalic.   Eyes:  pupils round.   Ears:     R ear normal and L ear normal.   Lungs:     bil wheezes no accessory muscle use.   Heart:     normal rate and regular rhythm.   Abdomen:     soft, non-tender, and normal bowel sounds.   Msk:     decreased ROM.   Neurologic:     alert & oriented X3.      Impression & Recommendations:  Problem # 1:  ASTHMA (ICD-493.90) Will start antibiotic given length of ilness. pt needs to use her inhaler more with this flare. Pt to also get mucinex. Her updated medication list for this problem includes:    Proventil Hfa 108 (90 Base) Mcg/act Aers (Albuterol sulfate) .Marland Kitchen... 2 puffs every 6 hours as needed for shortness of breath  Orders: Pulse Oximetry (single measurment) (94760)  Follow up on next scheduled visit for diabetes check.  Problem # 3:  ESSENTIAL HYPERTENSION, BENIGN (ICD-401.1) Blood pressure is stable.  no meds ith decongestant. Her updated medication list for this problem includes:    Avalide 150-12.5 Mg Tabs  (Irbesartan-hydrochlorothiazide) .Marland Kitchen... 1 tablet by mouth daily for blood pressure    Norvasc 10 Mg Tabs (Amlodipine besylate) .Marland Kitchen... 1 tablet by mouth daily for blood pressure   Complete Medication List: 1)  Janumet 50-500 Mg Tabs (Sitagliptin-metformin hcl) .Marland Kitchen.. 1 tablet by mouth daily for blood sugar 2)  Ultram 50 Mg Tabs (Tramadol hcl) .Marland Kitchen.. 1 tablet by mouth two times a day for pain as needed 3)  Celebrex 200 Mg Caps (Celecoxib) .Marland Kitchen.. 1 tablet by mouth daily for knee 4)  Proventil Hfa 108 (90 Base) Mcg/act Aers (Albuterol sulfate) .... 2 puffs every 6 hours as needed for shortness of breath 5)  Glucometer Elite Classic Kit (Blood glucose monitoring suppl) .... Dispense glucometer (whatever available) along with test strips, and lancets.  dx 250.00 check blood sugar once daily. 6)  Multivitamin/iron Tabs (Multiple vitamins-iron) .Marland Kitchen.. 1 tablet by mouth daily to build up blood 7)  Pravachol 40 Mg Tabs (Pravastatin sodium) .Marland Kitchen.. 1 tablet by mouth nightly for cholesterol 8)  Avalide 150-12.5 Mg Tabs (Irbesartan-hydrochlorothiazide) .Marland Kitchen.. 1 tablet by mouth daily for blood pressure 9)  Norvasc 10 Mg Tabs (Amlodipine besylate) .Marland Kitchen.. 1 tablet by mouth daily for blood pressure 10)  Aspirin 81 Mg Tbec (Aspirin) .Marland Kitchen.. 1 tablet by mouth daily 11)  Amoxil 500 Mg Caps (Amoxicillin) .Marland Kitchen.. 1 tablet by mouth three times a day for infection  Other Orders: Capillary Blood Glucose (32440) Fingerstick (10272)   Patient Instructions: 1)  Get plain Mucinex for cough. 2)  Use inhaler 2 puffs three times daily until this flare ends. 3)  Start amoxil 500mg  three times a day x 7 days.  Need to take with food.    Prescriptions: AMOXIL 500 MG  CAPS (AMOXICILLIN) 1 tablet by mouth three times a day for infection  #21 x 0   Entered and Authorized by:   Lehman Prom FNP   Signed by:   Lehman Prom FNP on 02/16/2008   Method used:   Print then Give to Patient   RxID:   [id number]  ]   ]

## 2011-01-05 NOTE — Assessment & Plan Note (Signed)
Summary: Acute - Muscle strain   Vital Signs:  Patient Profile:   70 Years Old Female Height:     58 inches Weight:      304 pounds BMI:     63.77 BSA:     2.18 Temp:     97.1 degrees F oral Pulse rate:   72 / minute Pulse rhythm:   regular Resp:     20 per minute BP sitting:   120 / 60  (left arm) Cuff size:   large  Pt. in pain?   yes    Location:   left shoulder  Vitals Entered By: Levon Hedger (July 04, 2008 3:48 PM)              Is Patient Diabetic? Yes  CBG Result 121 CBG Device ID A  Does patient need assistance? Ambulation Normal     PCP:  Daphine Deutscher  Chief Complaint:  left shoulder pain and swelling.  History of Present Illness:  Pt into the office with some complaints of pain in her left neck and shoulder. started about 3-4 days ago.  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  She is sitting for that time propped with arms up on table.  No other change in activity. She has taken some tramadol from previous RX but that did not help symtoms.  describes as intermittent spasms with tightness. Pt lives alone and also does house chores.  Obesity - pt has been making strides to decrease weight. down from 313 to 304 today.    Tobacco abuse - reports she is 36 days tobacco   Right knee arthritis - she has an appt in 2 weeks with ortho for right knee.      Updated Prior Medication List: JANUMET 50-500 MG  TABS (SITAGLIPTIN-METFORMIN HCL) 1 tablet by mouth daily for blood sugar ULTRAM 50 MG  TABS (TRAMADOL HCL) 1 tablet by mouth two times a day for pain as needed CELEBREX 200 MG  CAPS (CELECOXIB) 1 tablet by mouth daily for knee PROVENTIL HFA 108 (90 BASE) MCG/ACT  AERS (ALBUTEROL SULFATE) 2 puffs every 6 hours as needed for shortness of breath GLUCOMETER ELITE CLASSIC   KIT (BLOOD GLUCOSE MONITORING SUPPL) Dispense glucometer (whatever available) along with test strips, and lancets.  Dx 250.00 Check blood sugar once daily. MULTIVITAMIN/IRON    TABS (MULTIPLE VITAMINS-IRON) 1 tablet by mouth daily to build up blood AVALIDE 150-12.5 MG  TABS (IRBESARTAN-HYDROCHLOROTHIAZIDE) 1 tablet by mouth daily for blood pressure NORVASC 10 MG  TABS (AMLODIPINE BESYLATE) 1 tablet by mouth daily for blood pressure ASPIRIN 81 MG  TBEC (ASPIRIN) 1 tablet by mouth daily LIPITOR 20 MG  TABS (ATORVASTATIN CALCIUM) 1 tablet by mouth at night for cholesterol  Current Allergies (reviewed today): No known allergies     Risk Factors:  Tobacco use:  quit    Year quit:  june 2009 Drug use:  no Caffeine use:  3 drinks per day Alcohol use:  yes    Type:  alcohol Exercise:  no   Review of Systems  MS      Complains of cramps and stiffness.      neck   Physical Exam  General:     alert and overweight-appearing.   Head:     normocephalic.   Neck:     left sternocleomastoid tenderness some inflammation of left trapezius active ROM but rotation to right decreased Lungs:     normal breath sounds.  Heart:     normal rate and regular rhythm.   Abdomen:     normal bowel sounds.   Msk:     active ROM    Impression & Recommendations:  Problem # 1:  SPRAIN AND STRAIN OF STERNOCLAVICULAR (ICD-848.41) depomedrol and toradol given IM in office. pt given flector patches to use at home. Rx for muscle relaxers given no sewing for next 3 days then when restart needs frequent breaks Orders: Ketorolac-Toradol 15mg  (U0454) Depo- Medrol 40mg  (J1030)   Problem # 2:  OBESITY, MORBID (ICD-278.01) congrats to pt on weight loss.  Complete Medication List: 1)  Janumet 50-500 Mg Tabs (Sitagliptin-metformin hcl) .Marland Kitchen.. 1 tablet by mouth daily for blood sugar 2)  Ultram 50 Mg Tabs (Tramadol hcl) .Marland Kitchen.. 1 tablet by mouth two times a day for pain as needed 3)  Proventil Hfa 108 (90 Base) Mcg/act Aers (Albuterol sulfate) .... 2 puffs every 6 hours as needed for shortness of breath 4)  Glucometer Elite Classic Kit (Blood glucose monitoring suppl) ....  Dispense glucometer (whatever available) along with test strips, and lancets.  dx 250.00 check blood sugar once daily. 5)  Multivitamin/iron Tabs (Multiple vitamins-iron) .Marland Kitchen.. 1 tablet by mouth daily to build up blood 6)  Avalide 150-12.5 Mg Tabs (Irbesartan-hydrochlorothiazide) .Marland Kitchen.. 1 tablet by mouth daily for blood pressure 7)  Norvasc 10 Mg Tabs (Amlodipine besylate) .Marland Kitchen.. 1 tablet by mouth daily for blood pressure 8)  Aspirin 81 Mg Tbec (Aspirin) .Marland Kitchen.. 1 tablet by mouth daily 9)  Lipitor 20 Mg Tabs (Atorvastatin calcium) .Marland Kitchen.. 1 tablet by mouth at night for cholesterol 10)  Flexeril 5 Mg Tabs (Cyclobenzaprine hcl) .Marland Kitchen.. 1 tablet by mouth nightly as needed for muscles 11)  Flector 1.3 % Ptch (Diclofenac epolamine) .... Apply 1 patch to affected area on for 12 hours then off for 12 hours  Other Orders: Capillary Blood Glucose (09811) Fingerstick (91478)   Patient Instructions: 1)  Call back to see how much your co-pay will be if you keep just the red-white-blue.  2)  You may apply heat to the affected area. 3)  apply patch to affected area.  let stay on for 12 hours then remove. 4)  You may use muscle relaxers as night x 3 nights.  they will make you sleepy 5)  Follow up in 4-6 weeks for diabetes.   Prescriptions: FLECTOR 1.3 %  PTCH (DICLOFENAC EPOLAMINE) apply 1 patch to affected area on for 12 hours then off for 12 hours  #4 x 0   Entered and Authorized by:   Lehman Prom FNP   Signed by:   Lehman Prom FNP on 07/05/2008   Method used:   Samples Given   RxID:   [id number] FLEXERIL 5 MG  TABS (CYCLOBENZAPRINE HCL) 1 tablet by mouth nightly as needed for muscles  #10 x 0   Entered and Authorized by:   Lehman Prom FNP   Signed by:   Lehman Prom FNP on 07/04/2008   Method used:   Electronically sent to ...       [address]       [address]       [place], [place]  [address]       Ph: [phone]       Fax: [phone]   RxID:   [id number]  ]  Laboratory Results   Blood Tests     CBG Random:: 121       Medication Administration  Injection # 1:  Medication: Depo- Medrol 40mg     Route: IM    Site: R deltoid    Exp Date: 12/2009    Lot #: [id number]    Mfr: Pharmacia    Comments: ZOX  [id number]    Patient tolerated injection without complications    Given by: Levon Hedger (July 04, 2008 6:03 PM)  Injection # 2:    Medication: Ketorolac-Toradol 15mg     Route: IM    Site: L deltoid    Exp Date: 02/2010    Lot #: [id number]    Mfr: bedford    Comments: ndc  29562-130-86    Patient tolerated injection without complications    Given by: Levon Hedger (July 04, 2008 6:05 PM)  Orders Added: 1)  Capillary Blood Glucose [82948] 2)  Fingerstick [36416] 3)  Est. Patient Level III [57846] 4)  Ketorolac-Toradol 15mg  [J1885] 5)  Depo- Medrol 40mg  [J1030]

## 2011-01-05 NOTE — Letter (Signed)
Summary: Handout Printed  Printed Handout:  - Shingles (Herpes Zoster) 

## 2011-01-05 NOTE — Letter (Signed)
Summary: Handout Printed  Printed Handout:  - Neck Strain

## 2011-01-05 NOTE — Progress Notes (Signed)
Summary: Courtesy Call  Phone Note Call from Patient   Summary of Call: Former pt of yours just wanted to talk with you because you were on her mind.  Her number is [phone]. Initial call taken by: Vesta Mixer CMA,  August 07, 2010 11:37 AM  Follow-up for Phone Call        attempted to call pt - LMOM Follow-up by: Lehman Prom FNP,  August 11, 2010 8:21 AM

## 2011-01-05 NOTE — Assessment & Plan Note (Signed)
Summary: Diabetes/HTN   Vital Signs:  Patient profile:   70 year old female Height:      58 inches Weight:      289 pounds BMI:     60.62 BSA:     2.13 Temp:     97.3 degrees F oral Pulse rate:   64 / minute Pulse rhythm:   regular Resp:     16 per minute BP sitting:   136 / 100  (left arm) Cuff size:   large  Vitals Entered By: Levon Hedger (May 09, 2009 9:42 AM) CC: pt states she has been having a headache x 2 weeks with congestion and as soon as she lays down her head stops up/ 3  follow-up, Hypertension Management Pain Assessment Patient in pain? yes     Location: head CBG Result 106 CBG Device ID A  Does patient need assistance? Functional Status Self care Ambulation Normal Comments pt states she ran out of BP medications 2 days ago.   Primary Care Provider:  Daphine Deutscher  CC:  pt states she has been having a headache x 2 weeks with congestion and as soon as she lays down her head stops up/ 3  follow-up and Hypertension Management.  History of Present Illness:  Pt into the office for follow up.  Diabetes - still checking daily at home. Blood sugar is doing well.  Obesity - pt continues to lose weight. Down 9 pounds since her last visit. Pt reports that she is has been taking some OTC supplements  Congestion - ongoing for the past 2 weeks +nasal congestion +cough  -headahce -fever unable to sing at church due to mucous production Pt has taken several OTC remedies including mucinex and coricidan  Diabetes Management History:      The patient is a 70 years old female who comes in for evaluation of Type 2 Diabetes Mellitus.  She has not been enrolled in the "Diabetic Education Program".  She is not checking home blood sugars.  She says that she is not exercising regularly.    Hypertension History:      She denies headache, chest pain, and palpitations.  She notes no problems with any antihypertensive medication side effects.  No BP meds x 2 days.    Positive major cardiovascular risk factors include female age 44 years old or older, diabetes, hyperlipidemia, and hypertension.  Negative major cardiovascular risk factors include non-tobacco-user status.        Further assessment for target organ damage reveals no history of ASHD, cardiac end-organ damage (CHF/LVH), stroke/TIA, peripheral vascular disease, renal insufficiency, or hypertensive retinopathy.      Medications Prior to Update: 1)  Janumet 50-500 Mg  Tabs (Sitagliptin-Metformin Hcl) .Marland Kitchen.. 1 Tablet By Mouth Twice Daily For Blood Sugar 2)  Ultram 50 Mg  Tabs (Tramadol Hcl) .Marland Kitchen.. 1 Tablet By Mouth Two Times A Day For Pain As Needed 3)  Proventil Hfa 108 (90 Base) Mcg/act  Aers (Albuterol Sulfate) .... 2 Puffs Every 6 Hours As Needed For Shortness of Breath 4)  Glucometer Elite Classic   Kit (Blood Glucose Monitoring Suppl) .... Dispense Glucometer (Whatever Available) Along With Test Strips, and Lancets.  Dx 250.00 Check Blood Sugar Once Daily. 5)  Norvasc 10 Mg  Tabs (Amlodipine Besylate) .Marland Kitchen.. 1 Tablet By Mouth Daily For Blood Pressure 6)  Aspirin 81 Mg  Tbec (Aspirin) .Marland Kitchen.. 1 Tablet By Mouth Daily 7)  Lipitor 40 Mg Tabs (Atorvastatin Calcium) .... One Tablet By Mouth Nighlty For Cholesterol **  note Change in Dose** 8)  Ferrous Sulfate 325 (65 Fe) Mg Tbec (Ferrous Sulfate) .... Take One Tablet Twice A Day 9)  Folic Acid 1 Mg Tabs (Folic Acid) .... Take One Tablet By Mouth Every Day 10)  Sarna Ultra 1-0.5-30 % Crea (Pramoxine-Menthol-Petrolatum) 11)  Meloxicam 15 Mg Tabs (Meloxicam) .... One Tablet By Mouth Daily For Pain 12)  Lisinopril 40 Mg Tabs (Lisinopril) .... One Tablet By Mouth Daily For Blood Pressure  Allergies (verified): No Known Drug Allergies  Review of Systems General:  Denies fever. ENT:  Complains of nasal congestion. CV:  Denies chest pain or discomfort. Resp:  Complains of cough. GI:  Denies abdominal pain, nausea, and vomiting. Neuro:  Denies headaches.   Physical Exam  General:  alert.  obese Head:  normocephalic.   Eyes:  glasses Ears:  bil TM with clear fluid  Nose:  inflammed turbinates Lungs:  normal breath sounds.   Heart:  normal rate and regular rhythm.   Abdomen:  obese  BS x 4 Msk:  bunion - right foot.  inflammed Neurologic:  alert & oriented X3.    Diabetes Management Exam:    Foot Exam (with socks and/or shoes not present):       Sensory-Monofilament:          Left foot: normal          Right foot: normal       Nails:          Left foot: too long          Right foot: too long   Impression & Recommendations:  Problem # 1:  DIABETES MELLITUS, CONTROLLED (ICD-250.00) stable continue current meds rec eye exam Her updated medication list for this problem includes:    Janumet 50-500 Mg Tabs (Sitagliptin-metformin hcl) .Marland Kitchen... 1 tablet by mouth twice daily for blood sugar    Aspirin 81 Mg Tbec (Aspirin) .Marland Kitchen... 1 tablet by mouth daily    Lisinopril 20 Mg Tabs (Lisinopril) .Marland Kitchen... 2 tablets by mouth daily for blood pressure  Orders: Fingerstick (69629)  Problem # 2:  ESSENTIAL HYPERTENSION, BENIGN (ICD-401.1) elevated today but pt has been out of meds The following medications were removed from the medication list:    Norvasc 10 Mg Tabs (Amlodipine besylate) .Marland Kitchen... 1 tablet by mouth daily for blood pressure Her updated medication list for this problem includes:    Lisinopril 20 Mg Tabs (Lisinopril) .Marland Kitchen... 2 tablets by mouth daily for blood pressure  Problem # 3:  OBESITY, MORBID (ICD-278.01) still with weight loss congrats to pt  Problem # 4:  BUNION, RIGHT FOOT (ICD-727.1)  inflammed. pt is wearing open toe shoes now will refer to podiatry  Orders: Podiatry Referral (Podiatry)  Problem # 5:  HYPERCHOLESTEROLEMIA (ICD-272.0) will return next week to check lipids Her updated medication list for this problem includes:    Lipitor 40 Mg Tabs (Atorvastatin calcium) ..... One tablet by mouth nighlty for cholesterol  **note change in dose**  Problem # 6:  ANEMIA (ICD-285.9) will check labs today Her updated medication list for this problem includes:    Ferrous Sulfate 325 (65 Fe) Mg Tbec (Ferrous sulfate) .Marland Kitchen... Take one tablet twice a day    Folic Acid 1 Mg Tabs (Folic acid) .Marland Kitchen... Take one tablet by mouth every day  Orders: T-CBC w/Diff (52841-32440)  Problem # 7:  SINUSITIS (ICD-473.9)  Her updated medication list for this problem includes:    Flonase 50 Mcg/act Susp (Fluticasone propionate) .Marland Kitchen... 1 spray in  each nostril two times a day    Amoxil 500 Mg Caps (Amoxicillin) .Marland Kitchen... 1 capsule by mouth three times a day  Complete Medication List: 1)  Janumet 50-500 Mg Tabs (Sitagliptin-metformin hcl) .Marland Kitchen.. 1 tablet by mouth twice daily for blood sugar 2)  Ultram 50 Mg Tabs (Tramadol hcl) .Marland Kitchen.. 1 tablet by mouth two times a day for pain as needed 3)  Proventil Hfa 108 (90 Base) Mcg/act Aers (Albuterol sulfate) .... 2 puffs every 6 hours as needed for shortness of breath 4)  Glucometer Elite Classic Kit (Blood glucose monitoring suppl) .... Dispense glucometer (whatever available) along with test strips, and lancets.  dx 250.00 check blood sugar once daily. 5)  Aspirin 81 Mg Tbec (Aspirin) .Marland Kitchen.. 1 tablet by mouth daily 6)  Lipitor 40 Mg Tabs (Atorvastatin calcium) .... One tablet by mouth nighlty for cholesterol **note change in dose** 7)  Ferrous Sulfate 325 (65 Fe) Mg Tbec (Ferrous sulfate) .... Take one tablet twice a day 8)  Folic Acid 1 Mg Tabs (Folic acid) .... Take one tablet by mouth every day 9)  Sarna Ultra 1-0.5-30 % Crea (Pramoxine-menthol-petrolatum) 10)  Meloxicam 15 Mg Tabs (Meloxicam) .... One tablet by mouth daily for pain 11)  Lisinopril 20 Mg Tabs (Lisinopril) .... 2 tablets by mouth daily for blood pressure 12)  Flonase 50 Mcg/act Susp (Fluticasone propionate) .Marland Kitchen.. 1 spray in each nostril two times a day 13)  Amoxil 500 Mg Caps (Amoxicillin) .Marland Kitchen.. 1 capsule by mouth three times a day   Diabetes Management Assessment/Plan:      The following lipid goals have been established for the patient: Total cholesterol goal of 200; LDL cholesterol goal of 100; HDL cholesterol goal of 40; Triglyceride goal of 150.  Her blood pressure goal is < 130/80.    Hypertension Assessment/Plan:      The patient's hypertensive risk group is category C: Target organ damage and/or diabetes.  Her calculated 10 year risk of coronary heart disease is 15 %.  Today's blood pressure is 136/100.  Her blood pressure goal is < 130/80.  Patient Instructions: 1)  Lab visit next week for lipids - do not eat before this visit. 2)  Follow up in 3 months or sooner if necessary. 3)  You will be referred to [facility] for right foot bunion. 4)  Schedule an eye exam. Prescriptions: AMOXIL 500 MG CAPS (AMOXICILLIN) 1 capsule by mouth three times a day  #21 x 0   Entered and Authorized by:   Lehman Prom FNP   Signed by:   Lehman Prom FNP on 05/09/2009   Method used:   Print then Give to Patient   RxID:   [id number] FLONASE 50 MCG/ACT SUSP (FLUTICASONE PROPIONATE) 1 spray in each nostril two times a day  #1 x 3   Entered and Authorized by:   Lehman Prom FNP   Signed by:   Lehman Prom FNP on 05/09/2009   Method used:   Print then Give to Patient   RxID:   [id number]   Last LDL:                                                 97 (02/26/2009 10:00:00 PM)        Diabetic Foot Exam    10-g (5.07) Semmes-Weinstein Monofilament Test Performed by: Levon Hedger  Right Foot          Left Foot Visual Inspection               Test Control      normal         normal Site 1         normal         normal Site 2         normal         normal Site 3         normal         normal Site 4         normal         normal Site 5         normal         normal Site 6         normal         normal Site 7         normal         normal Site 8         normal         normal Site 9          normal         normal Site 10         normal         normal  Impression      normal         normal   Appended Document: Diabetes/HTN    Clinical Lists Changes  Orders: Added new Service order of Hemoglobin A1C (10272) - Signed Observations: Added new observation of HGBA1C: 6.2 % (05/09/2009 10:32)      Laboratory Results   Blood Tests   Date/Time Recieved: May 09, 2009 10:32 AM   HGBA1C: 6.2%   (Normal Range: Non-Diabetic - 3-6%   Control Diabetic - 6-8%)

## 2011-01-05 NOTE — Assessment & Plan Note (Signed)
Summary: Blood Pressure Check/Labs(UA results)  Nurse Visit   Vital Signs:  Patient profile:   70 year old female BP sitting:   140 / 84  (left arm) Cuff size:   large  Vitals Entered By: Mikey College CMA (February 26, 2009 10:01 AM)  Comments pt took meds this morning but didnt bring in with her since she thought it was just bloodwork.  Patient Instructions: 1)  Medicare will not cover Benicar.  You can take all that you have then start lisinopril 40mg  by mouth daily - Prescription given today. 2)  Continue to hold ( Do not take) the amlodipipine. 3)  You will be notified of any abnormal labs. 4)  Follow up in 2 weeks for blood pressure check.  Take your medications before you come.  Bring medications with you to that visit.    Allergies: No Known Drug Allergies  Laboratory Results   Urine Tests  Date/Time Received: February 26, 2009 10:32 AM  Routine Urinalysis   Color: lt. yellow Appearance: Clear Glucose: negative   (Normal Range: Negative) Bilirubin: negative   (Normal Range: Negative) Ketone: negative   (Normal Range: Negative) Spec. Gravity: 1.020   (Normal Range: 1.003-1.035) Blood: negative   (Normal Range: Negative) pH: 7.0   (Normal Range: 5.0-8.0) Protein: 100   (Normal Range: Negative) Urobilinogen: 1.0   (Normal Range: 0-1) Nitrite: negative   (Normal Range: Negative) Leukocyte Esterace: negative   (Normal Range: Negative)         Orders Added: 1)  T-Comprehensive Metabolic Panel [80053-22900] 2)  T-CBC w/Diff [16109-60454] 3)  T-Lipid Profile [80061-22930] 4)  Est. Patient Level I [09811] 5)  T-Urine Microalbumin w/creat. ratio [id number] / [id number]]   Rx printed on tamper proof paper Prescriptions: LISINOPRIL 40 MG TABS (LISINOPRIL) One tablet by mouth daily for blood pressure  #30 x 3   Entered by:   Mikey College CMA   Authorized by:   Lehman Prom FNP   Signed by:   Mikey College CMA on 02/26/2009   Method used:   Print then Give to Patient  RxID:   [id number] LISINOPRIL 40 MG TABS (LISINOPRIL) One tablet by mouth daily for blood pressure  #30 x 3   Entered by:   Mikey College CMA   Authorized by:   Lehman Prom FNP   Signed by:   Mikey College CMA on 02/26/2009   Method used:   Print then Give to Patient   RxID:   [id number]  ]

## 2011-01-05 NOTE — Progress Notes (Signed)
Summary: Shingles  Phone Note Call from Patient Call back at Home Phone [phone]   Caller: Patient Summary of Call: On Saturday, the pt went to the Urgent Care and they diagnosed her with shengals on her forehead but now is getting close to her eyes.   FNP Daphine Deutscher Initial call taken by: Manon Hilding,  November 04, 2008 9:59 AM  Follow-up for Phone Call        provider to review(send to Brenda's desktop) Follow-up by: Mikey College CMA,  November 07, 2008 2:57 PM  Additional Follow-up for Phone Call Additional follow up Details #1::        pt seen in office today Additional Follow-up by: Lehman Prom FNP,  November 07, 2008 3:16 PM

## 2011-01-05 NOTE — Letter (Signed)
Summary: NUTRITIONIST APPOINTMENT SUMMARY  NUTRITIONIST APPOINTMENT SUMMARY   Imported By: Arta Bruce 01/24/2008 09:56:31  _____________________________________________________________________  External Attachment:    Type:   Image     Comment:   External Document

## 2011-01-05 NOTE — Letter (Signed)
Summary: NUTRITIONIST/ SUMMARY  NUTRITIONIST/ SUMMARY   Imported By: Arta Bruce 01/15/2008 15:23:25  _____________________________________________________________________  External Attachment:    Type:   Image     Comment:   External Document

## 2011-01-05 NOTE — Assessment & Plan Note (Signed)
Summary: Diabetes/HTN/Bil knee arthritis/Asthma   Vital Signs:  Patient Profile:   70 Years Old Female Height:     58 inches Weight:      311 pounds BMI:     65.23 BSA:     2.20 Temp:     97.6 degrees F 0 Pulse rate:   88 / minute Pulse rhythm:   regular Resp:     20 per minute BP sitting:   110 / 78  (left arm) Cuff size:   large  Pt. in pain?   no  Vitals Entered By: Levon Hedger (January 03, 2008 9:07 AM)              Is Patient Diabetic? Yes  CBG Result 125 CBG Device ID B  Does patient need assistance? Ambulation Normal Comments pt did not bring medications, she states she is to take Celebrex after finishing one of her medications.   Last PAP Date 05/2007   Chief Complaint:  2 week follow-up and dizziness.  History of Present Illness:  Pt into the office to f/u on diabetes. Pt reports that she was unable to eat after her last visit.  She notes that she did get the flu vaccine on her last visit here.  Notes that she was not able to eat or drink anything for about 5 days.  She reports that she has not been around anyone else except for someone in the lobby that was ill during her last visit her.  Asthma - Pt did take albuterol for her asthma.  She is out of her inhaler.  notes that she only used the inhaler if she needed  Anemia - pt was found to be anemic during her last office visit.  Pt notes that she was not told that she had to take iron pills.  Pt does note that she has been very fatiqued.  Knee pain - pt was started on celebrex during her last visit.  She notes that it is helping some.  Pt still states she did have MRI done at her previous office.  Provider notified that hospital did not have any results.  Tobacco use continues. Maybe 1 cigarettes per day.    Diabetes Management History:      The patient is a 70 years old female who comes in for evaluation of DM Type 2.  She has not been enrolled in the "Diabetic Education Program".  She states lack of  understanding of dietary principles and is not following her diet appropriately.  No sensory loss is reported.  Self foot exams are not being performed.  She is not checking home blood sugars.  She says that she is not exercising regularly.        Symptoms which suggest diabetic complications include lightheadedness/orthostatic.  Other questions/concerns include: pt has not been able to eat for the past week with her usual appetite.  No changes have been made to her treatment plan since last visit.     Current Allergies: No known allergies     Risk Factors:  Tobacco use:  current    Counseled to quit/cut down tobacco use:  no Drug use:  no Caffeine use:  3 drinks per day Alcohol use:  yes    Type:  alcohol Exercise:  no   Review of Systems  General      Complains of loss of appetite.      Denies chills, fatigue, and fever.  ENT      Denies earache, nasal  congestion, and sore throat.  CV      Complains of fatigue and lightheadness.      Denies chest pain or discomfort and palpitations.  Resp      Denies cough and shortness of breath.  GI      Denies abdominal pain, nausea, and vomiting.  MS      bil knee pain but improving with celebrex   Physical Exam  General:     alert and overweight-appearing.   Head:     normocephalic.   Eyes:     glasses Ears:     R ear normal and L ear normal.   Nose:     no external deformity.   Lungs:     decreased bases but exam difficult due to body habitus Heart:     normal rate, regular rhythm, no murmur, and no gallop.   Abdomen:     soft, non-tender, and normal bowel sounds.   Msk:     slow range of motion unable to get on exam table Extremities:     trace left pedal edema and trace right pedal edema.   Neurologic:     alert & oriented X3.   Skin:     dry Psych:     Oriented X3.    Diabetes Management Exam:    Foot Exam (with socks and/or shoes not present):       Sensory-Monofilament:          Left foot:  normal          Right foot: normal    Impression & Recommendations:  Problem # 1:  DIABETES MELLITUS, TYPE II, UNCONTROLLED (ICD-250.02) More diabetes education done today. Will schedule appt with Susie Piper.  pt to get glucometer prior to that appt so that she can get instructions for use. Her updated medication list for this problem includes:    Diovan Hct 160-25 Mg Tabs (Valsartan-hydrochlorothiazide) .Marland Kitchen... 1 tablet by mouth daily for blood pressure    Janumet 50-500 Mg Tabs (Sitagliptin-metformin hcl) .Marland Kitchen... 1 tablet by mouth daily for blood sugar  Orders: T-Lipid Profile ([address]-60454) Misc. Referral (Misc. Ref)   Problem # 2:  UNSPECIFIED ARTHROPATHY, LOWER LEG (ICD-716.96) will order x-rays. continue celebrex Orders: Radiology other (Radiology Other)   Problem # 3:  ASTHMA (ICD-493.90) will order inhaler to use prn Her updated medication list for this problem includes:    Proventil Hfa 108 (90 Base) Mcg/act Aers (Albuterol sulfate) .Marland Kitchen... 2 puffs every 6 hours as needed for shortness of breath   Problem # 4:  ANEMIA (ICD-285.9) pt to start iron tablet.  Complete Medication List: 1)  Exforge 10-160 Mg Tabs (Amlodipine besylate-valsartan) .Marland Kitchen.. 1 tablet by mouth daily for blood pressure 2)  Diovan Hct 160-25 Mg Tabs (Valsartan-hydrochlorothiazide) .Marland Kitchen.. 1 tablet by mouth daily for blood pressure 3)  Janumet 50-500 Mg Tabs (Sitagliptin-metformin hcl) .Marland Kitchen.. 1 tablet by mouth daily for blood sugar 4)  Ultram 50 Mg Tabs (Tramadol hcl) .Marland Kitchen.. 1 tablet by mouth two times a day for pain as needed 5)  Celebrex 200 Mg Caps (Celecoxib) .Marland Kitchen.. 1 tablet by mouth daily for knee 6)  Proventil Hfa 108 (90 Base) Mcg/act Aers (Albuterol sulfate) .... 2 puffs every 6 hours as needed for shortness of breath 7)  Glucometer Elite Classic Kit (Blood glucose monitoring suppl) .... Dispense glucometer (whatever available) along with test strips, and lancets.  dx 250.00 check blood sugar once daily. 8)   Multivitamin/iron Tabs (Multiple vitamins-iron) .Marland Kitchen.. 1 tablet by  mouth daily to build up blood  Diabetes Management Assessment/Plan:      The following lipid goals have been established for the patient: Total cholesterol goal of 200; LDL cholesterol goal of 100; HDL cholesterol goal of 40; Triglyceride goal of 200.  Her blood pressure goal is < 130/80.     Patient Instructions: 1)  Follow up in 1 month for more diabetes education, need u/a, and review x-rays. 2)  Schedule appointment with Drucilla Schmidt the diabetes educator.  Get glucometer before going to this appointment so she can instruct you on how to check your blood sugar 3)  Will need to change meds over to bp meds that are available at [facility]    Prescriptions: MULTIVITAMIN/IRON   TABS (MULTIPLE VITAMINS-IRON) 1 tablet by mouth daily to build up blood  #30 x 3   Entered and Authorized by:   Lehman Prom FNP   Signed by:   Lehman Prom FNP on 01/03/2008   Method used:   Print then Give to Patient   RxID:   [id number] GLUCOMETER ELITE CLASSIC   KIT (BLOOD GLUCOSE MONITORING SUPPL) Dispense glucometer (whatever available) along with test strips, and lancets.  Dx 250.00 Check blood sugar once daily.  #1 box x 3   Entered and Authorized by:   Lehman Prom FNP   Signed by:   Lehman Prom FNP on 01/03/2008   Method used:   Print then Give to Patient   RxID:   [id number] CELEBREX 200 MG  CAPS (CELECOXIB) 1 tablet by mouth daily for knee  #30 x 3   Entered and Authorized by:   Lehman Prom FNP   Signed by:   Lehman Prom FNP on 01/03/2008   Method used:   Print then Give to Patient   RxID:   [id number] PROVENTIL HFA 108 (90 BASE) MCG/ACT  AERS (ALBUTEROL SULFATE) 2 puffs every 6 hours as needed for shortness of breath  #1 x 3   Entered and Authorized by:   Lehman Prom FNP   Signed by:   Lehman Prom FNP on 01/03/2008   Method used:   Print then Give to Patient   RxID:   [id number]   ]        Diabetic Foot Exam    10-g (5.07) Semmes-Weinstein Monofilament Test Performed by: Levon Hedger          Right Foot          Left Foot Visual Inspection               Test Control      normal         normal Site 1         normal         normal Site 2         normal         normal Site 3         normal         normal Site 4         normal         normal Site 5         normal         normal Site 6         normal         normal Site 7         normal         normal Site 8  normal         normal Site 9         normal         normal Site 10         normal         normal  Impression      normal         normal

## 2011-01-05 NOTE — Letter (Signed)
Summary: MEDICARE CARD  MEDICARE CARD   Imported By: Leodis Rains 05/13/2009 17:00:31  _____________________________________________________________________  External Attachment:    Type:   Image     Comment:   External Document

## 2011-01-05 NOTE — Consult Note (Signed)
Summary: Consultation Report  Consultation Report   Imported By: Peggyann Shoals 04/01/2010 13:23:00  _____________________________________________________________________  External Attachment:    Type:   Image     Comment:   External Document

## 2011-01-05 NOTE — Assessment & Plan Note (Signed)
Summary: PER FNP [facility]/ FLU INJECTION//GK  Nurse Visit    Prior Medications: JANUMET 50-500 MG  TABS (SITAGLIPTIN-METFORMIN HCL) 1 tablet by mouth twice daily for blood sugar ULTRAM 50 MG  TABS (TRAMADOL HCL) 1 tablet by mouth two times a day for pain as needed PROVENTIL HFA 108 (90 BASE) MCG/ACT  AERS (ALBUTEROL SULFATE) 2 puffs every 6 hours as needed for shortness of breath GLUCOMETER ELITE CLASSIC   KIT (BLOOD GLUCOSE MONITORING SUPPL) Dispense glucometer (whatever available) along with test strips, and lancets.  Dx 250.00 Check blood sugar once daily. NORVASC 10 MG  TABS (AMLODIPINE BESYLATE) 1 tablet by mouth daily for blood pressure ASPIRIN 81 MG  TBEC (ASPIRIN) 1 tablet by mouth daily LIPITOR 20 MG  TABS (ATORVASTATIN CALCIUM) 1 tablet by mouth at night for cholesterol FLEXERIL 5 MG  TABS (CYCLOBENZAPRINE HCL) 1 tablet by mouth nightly as needed for muscles FLECTOR 1.3 %  PTCH (DICLOFENAC EPOLAMINE) apply 1 patch to affected area on for 12 hours then off for 12 hours BENICAR 20 MG TABS (OLMESARTAN MEDOXOMIL) 1 tablet by mouth daily for blood pressure FERROUS SULFATE 325 (65 FE) MG TBEC (FERROUS SULFATE) take one tablet twice a day FOLIC ACID 1 MG TABS (FOLIC ACID) take one tablet by mouth every day COLACE 100 MG CAPS (DOCUSATE SODIUM) take one tablet two time a day Current Allergies: No known allergies    Influenza Vaccine    Vaccine Type: Fluvax MCR    Site: left deltoid    Mfr: [employer] Pasteur    Dose: 0.5 ml    Route: IM    Given by: Gaylyn Cheers RN    Exp. Date: 06/04/2009    Lot #: [id number]    VIS given: 06/29/07 version given September 18, 2008.  Flu Vaccine Consent Questions    Do you have a history of severe allergic reactions to this vaccine? no    Any prior history of allergic reactions to egg and/or gelatin? no    Do you have a sensitivity to the preservative Thimersol? no    Do you have a past history of Guillan-Barre Syndrome? no    Do you currently have an  acute febrile illness? no    Have you ever had a severe reaction to latex? no    Vaccine information given and explained to patient? yes    Are you currently pregnant? no   Orders Added: 1)  Influenza Vaccine MCR [00025] 2)  Est. Patient Nurse visit [facility]    ]

## 2011-01-05 NOTE — Assessment & Plan Note (Signed)
Summary: Diabetes/HTN   Vital Signs:  Patient Profile:   70 Years Old Female Height:     58 inches Weight:      296 pounds BMI:     62.09 BSA:     2.15 Temp:     96.8 degrees F oral Pulse rate:   76 / minute Pulse rhythm:   regular Resp:     20 per minute BP sitting:   130 / 90  (left arm) Cuff size:   large  Pt. in pain?   no  Vitals Entered By: Levon Hedger (November 07, 2008 9:53 AM)              Is Patient Diabetic? Yes CBG Result 150 CBG Device ID [id number]  Does patient need assistance? Ambulation Normal     PCP:  Daphine Deutscher   History of Present Illness:  Pt into the office for routine the follow up.  She has been to Urgent care on last week.  Dx with Shingles.   she did get the medications as advised.  She reports that area has been very puritic and she is trying to avoid scratching.   Pt keeps a 22 month old infant and is asking if she should continue to keep the child. Reports that she had a headache prior to onset of symptoms  Colonscopy - done.  polyps removed.  Physical Therapy - pt did go some sessions but she had to discontinue.  Pt is still doing her sessions at home.  improvement in left shoulder.  Obesity - pt is still making strides to decrease weight.  Pt is still taking her medications as ordered.  She has medicare and needs refills on all her meds to take to the pharmacy.    Diabetes Management History:      The patient is a 70 years old female who comes in for evaluation of DM Type 2.  She has not been enrolled in the "Diabetic Education Program".  She states understanding of dietary principles and is following her diet appropriately.  No sensory loss is reported.  Self foot exams are not being performed.  She is not checking home blood sugars.  She says that she is not exercising regularly.        Hypoglycemic symptoms are not occurring.  No hyperglycemic symptoms are reported.        No changes have been made to her treatment plan since last  visit.    Hypertension History:      She denies headache, chest pain, and palpitations.  She notes no problems with any antihypertensive medication side effects.        Positive major cardiovascular risk factors include female age 67 years old or older, diabetes, and hypertension.  Negative major cardiovascular risk factors include non-tobacco-user status.        Further assessment for target organ damage reveals no history of ASHD, cardiac end-organ damage (CHF/LVH), stroke/TIA, peripheral vascular disease, renal insufficiency, or hypertensive retinopathy.        Updated Prior Medication List: JANUMET 50-500 MG  TABS (SITAGLIPTIN-METFORMIN HCL) 1 tablet by mouth twice daily for blood sugar ULTRAM 50 MG  TABS (TRAMADOL HCL) 1 tablet by mouth two times a day for pain as needed PROVENTIL HFA 108 (90 BASE) MCG/ACT  AERS (ALBUTEROL SULFATE) 2 puffs every 6 hours as needed for shortness of breath GLUCOMETER ELITE CLASSIC   KIT (BLOOD GLUCOSE MONITORING SUPPL) Dispense glucometer (whatever available) along with test strips, and lancets.  Dx 250.00 Check blood sugar once daily. NORVASC 10 MG  TABS (AMLODIPINE BESYLATE) 1 tablet by mouth daily for blood pressure ASPIRIN 81 MG  TBEC (ASPIRIN) 1 tablet by mouth daily LIPITOR 20 MG  TABS (ATORVASTATIN CALCIUM) 1 tablet by mouth at night for cholesterol FLEXERIL 5 MG  TABS (CYCLOBENZAPRINE HCL) 1 tablet by mouth nightly as needed for muscles FLECTOR 1.3 %  PTCH (DICLOFENAC EPOLAMINE) apply 1 patch to affected area on for 12 hours then off for 12 hours BENICAR 20 MG TABS (OLMESARTAN MEDOXOMIL) 1 tablet by mouth daily for blood pressure FERROUS SULFATE 325 (65 FE) MG TBEC (FERROUS SULFATE) take one tablet twice a day FOLIC ACID 1 MG TABS (FOLIC ACID) take one tablet by mouth every day COLACE 100 MG CAPS (DOCUSATE SODIUM) take one tablet two time a day ACYCLOVIR 800 MG TABS (ACYCLOVIR) take one tablet by mouth every 6hours for 7 days SARNA ULTRA 1-0.5-30 %  CREA (PRAMOXINE-MENTHOL-PETROLATUM)   Current Allergies (reviewed today): No known allergies     Risk Factors: Tobacco use:  quit    Year quit:  june 2009 Drug use:  no Caffeine use:  3 drinks per day Alcohol use:  yes    Type:  alcohol Exercise:  no  Colonoscopy History:    Date of Last Colonoscopy:  10/15/2008   Review of Systems  General      Denies fever.  Resp      Denies cough.  GI      Denies abdominal pain, nausea, and vomiting.  Derm      Complains of lesion(s).   Physical Exam  General:     alert.  obese Head:     normocephalic.   Lungs:     normal breath sounds.   Heart:     normal rate and regular rhythm.   Abdomen:     soft, non-tender, and normal bowel sounds.   Neurologic:     alert & oriented X3.   Skin:     shingles to forehead and nose Psych:     Oriented X3.    Diabetes Management Exam:    Foot Exam (with socks and/or shoes not present):       Sensory-Pinprick/Light touch:          Left medial foot (L-4): normal          Left dorsal foot (L-5): normal          Left lateral foot (S-1): normal          Right medial foot (L-4): normal          Right dorsal foot (L-5): normal          Right lateral foot (S-1): normal       Sensory-Monofilament:          Left foot: normal          Right foot: normal       Inspection:          Left foot: normal          Right foot: normal       Nails:          Left foot: normal          Right foot: normal    Impression & Recommendations:  Problem # 1:  SHINGLES (ICD-053.9) advised of the dx. pt should contact with the child she keeps until symptoms clear  Problem # 2:  SHOULDER PAIN, LEFT (ICD-719.41) Pt did go to  PT but improved.  She is doing the exercises at home.  she has restarted her crocheting Her updated medication list for this problem includes:    Ultram 50 Mg Tabs (Tramadol hcl) .Marland Kitchen... 1 tablet by mouth two times a day for pain as needed    Aspirin 81 Mg Tbec (Aspirin) .Marland Kitchen...  1 tablet by mouth daily    Flexeril 5 Mg Tabs (Cyclobenzaprine hcl) .Marland Kitchen... 1 tablet by mouth nightly as needed for muscles    Meloxicam 15 Mg Tabs (Meloxicam) .Marland Kitchen... Take one tablet by mouth everyday john sheilds   Problem # 3:  ESSENTIAL HYPERTENSION, BENIGN (ICD-401.1) stable. no meds yet today. may consider decreasing meds on next visit if weight loss continues Her updated medication list for this problem includes:    Norvasc 10 Mg Tabs (Amlodipine besylate) .Marland Kitchen... 1 tablet by mouth daily for blood pressure    Benicar 20 Mg Tabs (Olmesartan medoxomil) .Marland Kitchen... 1 tablet by mouth daily for blood pressure   Problem # 4:  DIABETES MELLITUS, CONTROLLED (ICD-250.00) very well controlled.  pt needs to monitor blood sugar at home. Her updated medication list for this problem includes:    Janumet 50-500 Mg Tabs (Sitagliptin-metformin hcl) .Marland Kitchen... 1 tablet by mouth twice daily for blood sugar    Aspirin 81 Mg Tbec (Aspirin) .Marland Kitchen... 1 tablet by mouth daily    Benicar 20 Mg Tabs (Olmesartan medoxomil) .Marland Kitchen... 1 tablet by mouth daily for blood pressure  Orders: Capillary Blood Glucose (82948) Fingerstick (36416) Hemoglobin A1C (83036)   Problem # 5:  OBESITY, MORBID (ICD-278.01) doing good on weight loss keep up the good work  Problem # 6:  UNSPECIFIED ARTHROPATHY, LOWER LEG (ICD-716.96) pt has been to [facility] but was unable to go back for follow up.  She is still taking the meloxicam as ordered by that office. can do local referral now since pt has medicare. symptoms improved with weight loss  Problem # 7:  ANEMIA (ICD-285.9) check cbc today Her updated medication list for this problem includes:    Ferrous Sulfate 325 (65 Fe) Mg Tbec (Ferrous sulfate) .Marland Kitchen... Take one tablet twice a day    Folic Acid 1 Mg Tabs (Folic acid) .Marland Kitchen... Take one tablet by mouth every day  Orders: T-CBC w/Diff (87564-33295)   Complete Medication List: 1)  Janumet 50-500 Mg Tabs (Sitagliptin-metformin hcl) .Marland Kitchen.. 1  tablet by mouth twice daily for blood sugar 2)  Ultram 50 Mg Tabs (Tramadol hcl) .Marland Kitchen.. 1 tablet by mouth two times a day for pain as needed 3)  Proventil Hfa 108 (90 Base) Mcg/act Aers (Albuterol sulfate) .... 2 puffs every 6 hours as needed for shortness of breath 4)  Glucometer Elite Classic Kit (Blood glucose monitoring suppl) .... Dispense glucometer (whatever available) along with test strips, and lancets.  dx 250.00 check blood sugar once daily. 5)  Norvasc 10 Mg Tabs (Amlodipine besylate) .Marland Kitchen.. 1 tablet by mouth daily for blood pressure 6)  Aspirin 81 Mg Tbec (Aspirin) .Marland Kitchen.. 1 tablet by mouth daily 7)  Lipitor 20 Mg Tabs (Atorvastatin calcium) .Marland Kitchen.. 1 tablet by mouth at night for cholesterol 8)  Flexeril 5 Mg Tabs (Cyclobenzaprine hcl) .Marland Kitchen.. 1 tablet by mouth nightly as needed for muscles 9)  Flector 1.3 % Ptch (Diclofenac epolamine) .... Apply 1 patch to affected area on for 12 hours then off for 12 hours 10)  Benicar 20 Mg Tabs (Olmesartan medoxomil) .Marland Kitchen.. 1 tablet by mouth daily for blood pressure 11)  Ferrous Sulfate 325 (65 Fe)  Mg Tbec (Ferrous sulfate) .... Take one tablet twice a day 12)  Folic Acid 1 Mg Tabs (Folic acid) .... Take one tablet by mouth every day 13)  Colace 100 Mg Caps (Docusate sodium) .... Take one tablet two time a day 14)  Acyclovir 800 Mg Tabs (Acyclovir) .... Take one tablet by mouth every 6hours for 7 days 15)  Sarna Ultra 1-0.5-30 % Crea (Pramoxine-menthol-petrolatum) 16)  Meloxicam 15 Mg Tabs (Meloxicam) .... Take one tablet by mouth everyday john sheilds  Diabetes Management Assessment/Plan:      The following lipid goals have been established for the patient: Total cholesterol goal of 200; LDL cholesterol goal of 100; HDL cholesterol goal of 40; Triglyceride goal of 200.  Her blood pressure goal is < 130/80.    Hypertension Assessment/Plan:      The patient's hypertensive risk group is category C: Target organ damage and/or diabetes.  Her calculated 10 year  risk of coronary heart disease is 17 %.  Today's blood pressure is 130/90.  Her blood pressure goal is < 130/80.   Patient Instructions: 1)  Keep up the good work. 2)  Continue your medications. 3)  Your Hgba1c = 5.7 which is wonderful. 4)  Monitor your blood sugar at least once daily before breakfast. 5)  If you start getting low values - consecutive days of less than 90 let me know. 6)  If you need referral to ortho perhaps it can be done locally now.  Continue the medications as they prescribed.  You may find that pain has improved since you have lost weight and is more active 7)  Follow up in 3 months (march 2010).  if blood sugar and blood pressure is still doing well, may consider decreasing some medication at that time.  You must continue to lose weight.   Prescriptions: FOLIC ACID 1 MG TABS (FOLIC ACID) take one tablet by mouth every day  #30 x 5   Entered and Authorized by:   Lehman Prom FNP   Signed by:   Lehman Prom FNP on 11/07/2008   Method used:   Print then Give to Patient   RxID:   [id number] FERROUS SULFATE 325 (65 FE) MG TBEC (FERROUS SULFATE) take one tablet twice a day  #60 x 5   Entered and Authorized by:   Lehman Prom FNP   Signed by:   Lehman Prom FNP on 11/07/2008   Method used:   Print then Give to Patient   RxID:   [id number] BENICAR 20 MG TABS (OLMESARTAN MEDOXOMIL) 1 tablet by mouth daily for blood pressure  #30 x 5   Entered and Authorized by:   Lehman Prom FNP   Signed by:   Lehman Prom FNP on 11/07/2008   Method used:   Print then Give to Patient   RxID:   [id number] LIPITOR 20 MG  TABS (ATORVASTATIN CALCIUM) 1 tablet by mouth at night for cholesterol  #30 x 5   Entered and Authorized by:   Lehman Prom FNP   Signed by:   Lehman Prom FNP on 11/07/2008   Method used:   Print then Give to Patient   RxID:   [id number] NORVASC 10 MG  TABS (AMLODIPINE BESYLATE) 1 tablet by mouth daily for blood pressure   #30 x 5   Entered and Authorized by:   Lehman Prom FNP   Signed by:   Lehman Prom FNP on 11/07/2008   Method used:   Print then Give to Patient  RxID:   [id number] PROVENTIL HFA 108 (90 BASE) MCG/ACT  AERS (ALBUTEROL SULFATE) 2 puffs every 6 hours as needed for shortness of breath  #1 x 3   Entered and Authorized by:   Lehman Prom FNP   Signed by:   Lehman Prom FNP on 11/07/2008   Method used:   Print then Give to Patient   RxID:   [id number] ULTRAM 50 MG  TABS (TRAMADOL HCL) 1 tablet by mouth two times a day for pain as needed  #50 x 0   Entered and Authorized by:   Lehman Prom FNP   Signed by:   Lehman Prom FNP on 11/07/2008   Method used:   Print then Give to Patient   RxID:   [id number] JANUMET 50-500 MG  TABS (SITAGLIPTIN-METFORMIN HCL) 1 tablet by mouth twice daily for blood sugar  #60 x 5   Entered and Authorized by:   Lehman Prom FNP   Signed by:   Lehman Prom FNP on 11/07/2008   Method used:   Print then Give to Patient   RxID:   [id number]  ]  Orders Added: 1)  Capillary Blood Glucose [82948] 2)  Fingerstick [36416] 3)  Est. Patient Level III [43329] 4)  Hemoglobin A1C [83036] 5)  T-CBC w/Diff [51884-16606]     Vital Signs:  Patient Profile:   70 Years Old Female Height:     58 inches Weight:      296 pounds BMI:     62.09 BSA:     2.15 Temp:     96.8 degrees F oral Pulse rate:   76 / minute Pulse rhythm:   regular Resp:     20 per minute BP sitting:   130 / 90 Cuff size:   large             CBG Result 150 CBG Device ID A    Laboratory Results   Blood Tests   Date/Time Recieved: November 07, 2008 10:57 AM   HGBA1C: 5.7%   (Normal Range: Non-Diabetic - 3-6%   Control Diabetic - 6-8%) CBG Random: 150

## 2011-01-05 NOTE — Letter (Signed)
Summary: REQUESTING RECORDS FROM DR.VEITA BLAND  REQUESTING RECORDS FROM DR.VEITA BLAND   Imported By: Arta Bruce 01/05/2008 16:19:39  _____________________________________________________________________  External Attachment:    Type:   Image     Comment:   External Document

## 2011-01-05 NOTE — Letter (Signed)
Summary: PATIENT INFORMATIUON HISTORY SHEET  PATIENT INFORMATIUON HISTORY SHEET   Imported ByArta Bruce 12/20/2007 10:58:39  _____________________________________________________________________  External Attachment:    Type:   Image     Comment:   External Document

## 2011-01-05 NOTE — Progress Notes (Signed)
  Phone Note Call from Patient Call back at [facility] Phone [phone]   Caller: Patient Summary of Call: According to her, she might had a negative reaction to the flu injection; she has a lot pain in her arm and unfortunately, don't have the same flexibility. FNP Daphine Deutscher Initial call taken by: Manon Hilding,  December 25, 2007 10:53 AM  Follow-up for Phone Call        left message on machine for pt to make a return call to the office Follow-up by: Levon Hedger,  December 27, 2007 5:14 PM  Additional Follow-up for Phone Call Additional follow up Details #1::        spoke with pt she states she has had soreness all over her body since she had the flu shot at her last visit.  the next morning she had more soreness, chills dizziness, restless just not feeling up to par.  she states she had fever and did not take her temperature but she feels like it , no appetite and she says the only thing that she feels she can tolerate is icecream.  she says she was going to come up here on the Tuesday but it snowed.  she has been taking tylenol and says she worried because she has asthma.  I told her I would get this to her doctor and that I would call her back as soon as I here from the provider. Additional Follow-up by: Levon Hedger,  December 28, 2007 2:51 PM    Additional Follow-up for Phone Call Additional follow up Details #2::    It is not un-common for pt to have some soreness in her arm from the injection.  She can continue to take the tylenol every 6 hours as needed.  stay hydrated Inform pt that the flu vaccine does not GIVE her the flu.  If she had these symptoms she must have been exposed to someone who was already ill.  Vaccine does takt 2-3 weeks to get immunity. Follow-up by: Lehman Prom FNP,  December 28, 2007 3:24 PM  Additional Follow-up for Phone Call Additional follow up Details #3:: Details for Additional Follow-up Action Taken: spoke with pt and let her know of Manroop Jakubowicz's  orders . pt will call office if any other concerns arise. Additional Follow-up by: Levon Hedger,  December 28, 2007 3:41 PM

## 2011-01-05 NOTE — Assessment & Plan Note (Signed)
Summary: Diabetes/Left arm pain   Vital Signs:  Patient Profile:   70 Years Old Female Height:     58 inches Weight:      295 pounds BMI:     61.88 BSA:     2.15 Temp:     130 degrees F 70 Pulse rate:   72 / minute Pulse rhythm:   regular Resp:     20 per minute BP sitting:   130 / 70  (left arm) Cuff size:   large  Pt. in pain?   yes    Location:   left arm  Vitals Entered By: Levon Hedger (August 21, 2008 10:08 AM)              Is Patient Diabetic? Yes  CBG Result 224 CBG Device ID B  Does patient need assistance? Ambulation Normal     PCP:  Daphine Deutscher  Chief Complaint:  left arm pain at night and needs something to help her sleep/  hospital visit UTI.  History of Present Illness:  Pt was s/p a hospital visit.  She had sepsis secondary to UTI.  She did finish Cipro as ordered. Pt into the office with some complaints of left arm pain.  Left arm pain - seen on last visit.  she notes that the pain is getting more intense.  Radiation of pain down into the hand.  She has sharp pains in the arm.  She was told on the last visit to limit her use of the arm some activities of the left arm. x-rays done in hospital - degenerative changes.  Obesity - pt is still increasing her activity. She has lost many pounds. She is now down to 295.    Diabetes Management History:      The patient is a 70 years old female who comes in for evaluation of DM Type 2.  She has not been enrolled in the "Diabetic Education Program".  She states understanding of dietary principles and is following her diet appropriately.  No sensory loss is reported.  Self foot exams are not being performed.  She is not checking home blood sugars.  She says that she is not exercising regularly.        Hypoglycemic symptoms are not occurring.  No hyperglycemic symptoms are reported.  Other comments include: pt does not have her janumet into the office today.  She reports that she has not had this med in over 1  months. .        The following changes have been made to her treatment plan since last visit: exercise program.    Hypertension History:      She denies headache, chest pain, and palpitations.  Further comments include: She was changed to benicar while in the hospital.  .        Positive major cardiovascular risk factors include female age 58 years old or older, diabetes, and hypertension.  Negative major cardiovascular risk factors include non-tobacco-user status.        Further assessment for target organ damage reveals no history of ASHD, cardiac end-organ damage (CHF/LVH), stroke/TIA, peripheral vascular disease, renal insufficiency, or hypertensive retinopathy.        Current Allergies (reviewed today): No known allergies     Risk Factors: Tobacco use:  quit    Year quit:  june 2009 Drug use:  no Caffeine use:  3 drinks per day Alcohol use:  yes    Type:  alcohol Exercise:  no   Review  of Systems  CV      Denies chest pain or discomfort.  Resp      Denies cough.  MS      Complains of joint pain.      left shoulder pain   Physical Exam  General:     alert and overweight-appearing but can see evidence of weight loss Head:     normocephalic.   Eyes:     glasses Neck:     supple.   Lungs:     normal breath sounds.   Heart:     normal rate and regular rhythm.   Abdomen:     normal bowel sounds.   Msk:     left arm shoulder -  Neurologic:     alert & oriented X3.    Diabetes Management Exam:    Foot Exam (with socks and/or shoes not present):       Sensory-Monofilament:          Left foot: normal          Right foot: normal   Shoulder/Elbow Exam  General:    obese.    Shoulder Exam:    Left:    Inspection:  Normal    Palpation:  Abnormal       Location:  right AC joint    Stability:  stable    Tenderness:  right AC joint    Swelling:  no    Erythema:  no    Impression & Recommendations:  Problem # 1:  DIABETES MELLITUS, CONTROLLED  (ICD-250.00) need to restart meds but monitor blood sugar hgba1c is controlled today. Her updated medication list for this problem includes:    Janumet 50-500 Mg Tabs (Sitagliptin-metformin hcl) .Marland Kitchen... 1 tablet by mouth daily for blood sugar    Aspirin 81 Mg Tbec (Aspirin) .Marland Kitchen... 1 tablet by mouth daily    Benicar 20 Mg Tabs (Olmesartan medoxomil) .Marland Kitchen... 1 tablet by mouth daily for blood pressure   Problem # 2:  ESSENTIAL HYPERTENSION, BENIGN (ICD-401.1) benicar started in the hospital if not covered by medicare she will need an ACE Her updated medication list for this problem includes:    Norvasc 10 Mg Tabs (Amlodipine besylate) .Marland Kitchen... 1 tablet by mouth daily for blood pressure    Benicar 20 Mg Tabs (Olmesartan medoxomil) .Marland Kitchen... 1 tablet by mouth daily for blood pressure   Problem # 3:  ANEMIA (ICD-285.9)  Her updated medication list for this problem includes:    Ferrous Sulfate 325 (65 Fe) Mg Tbec (Ferrous sulfate) .Marland Kitchen... Take one tablet twice a day    Folic Acid 1 Mg Tabs (Folic acid) .Marland Kitchen... Take one tablet by mouth every day  Orders: T-CBC w/Diff (60630-16010)   Problem # 4:  SHOULDER PAIN, LEFT (ICD-719.41) will order for MRI. x-rays showed degenerative changes with spurring Her updated medication list for this problem includes:    Ultram 50 Mg Tabs (Tramadol hcl) .Marland Kitchen... 1 tablet by mouth two times a day for pain as needed    Aspirin 81 Mg Tbec (Aspirin) .Marland Kitchen... 1 tablet by mouth daily    Flexeril 5 Mg Tabs (Cyclobenzaprine hcl) .Marland Kitchen... 1 tablet by mouth nightly as needed for muscles  Orders: MRI (MRI)   Problem # 5:  OBESITY, MORBID (ICD-278.01) pt is making strides at decreasing weight.  Complete Medication List: 1)  Janumet 50-500 Mg Tabs (Sitagliptin-metformin hcl) .Marland Kitchen.. 1 tablet by mouth daily for blood sugar 2)  Ultram 50 Mg Tabs (Tramadol hcl) .Marland Kitchen.. 1 tablet  by mouth two times a day for pain as needed 3)  Proventil Hfa 108 (90 Base) Mcg/act Aers (Albuterol sulfate) .... 2  puffs every 6 hours as needed for shortness of breath 4)  Glucometer Elite Classic Kit (Blood glucose monitoring suppl) .... Dispense glucometer (whatever available) along with test strips, and lancets.  dx 250.00 check blood sugar once daily. 5)  Norvasc 10 Mg Tabs (Amlodipine besylate) .Marland Kitchen.. 1 tablet by mouth daily for blood pressure 6)  Aspirin 81 Mg Tbec (Aspirin) .Marland Kitchen.. 1 tablet by mouth daily 7)  Lipitor 20 Mg Tabs (Atorvastatin calcium) .Marland Kitchen.. 1 tablet by mouth at night for cholesterol 8)  Flexeril 5 Mg Tabs (Cyclobenzaprine hcl) .Marland Kitchen.. 1 tablet by mouth nightly as needed for muscles 9)  Flector 1.3 % Ptch (Diclofenac epolamine) .... Apply 1 patch to affected area on for 12 hours then off for 12 hours 10)  Benicar 20 Mg Tabs (Olmesartan medoxomil) .Marland Kitchen.. 1 tablet by mouth daily for blood pressure 11)  Ferrous Sulfate 325 (65 Fe) Mg Tbec (Ferrous sulfate) .... Take one tablet twice a day 12)  Folic Acid 1 Mg Tabs (Folic acid) .... Take one tablet by mouth every day 13)  Colace 100 Mg Caps (Docusate sodium) .... Take one tablet two time a day  Other Orders: T-Comprehensive Metabolic Panel ([address]-60454) Capillary Blood Glucose (82948) Fingerstick (36416) Hemoglobin A1C (09811)  Diabetes Management Assessment/Plan:      The following lipid goals have been established for the patient: Total cholesterol goal of 200; LDL cholesterol goal of 100; HDL cholesterol goal of 40; Triglyceride goal of 200.  Her blood pressure goal is < 130/80.    Hypertension Assessment/Plan:      The patient's hypertensive risk group is category C: Target organ damage and/or diabetes.  Her calculated 10 year risk of coronary heart disease is 13 %.  Today's blood pressure is 130/70.  Her blood pressure goal is < 130/80.   Patient Instructions: 1)  You are doing a great job. 2)  Continue your weight loss efforts. 3)  We will look at your medcations on next visit and see if they can be adjusted. 4)  Diabetes - you will  need to keep a close eye on your blood sugars.  If you have blood sugars for many days in the 70's before breakfast then let this provider know. 5)  Call in [facility] - October for a nurse visit for Flu Vaccine 6)  Get MRI of left shoulder as ordered. 7)  Call to make a follow up appointment in November for diabetes check.   Prescriptions: ULTRAM 50 MG  TABS (TRAMADOL HCL) 1 tablet by mouth two times a day for pain as needed  #50 x 0   Entered and Authorized by:   Lehman Prom FNP   Signed by:   Lehman Prom FNP on 08/21/2008   Method used:   Print then Give to Patient   RxID:   [id number] JANUMET 50-500 MG  TABS (SITAGLIPTIN-METFORMIN HCL) 1 tablet by mouth daily for blood sugar  #60 x 3   Entered and Authorized by:   Lehman Prom FNP   Signed by:   Lehman Prom FNP on 08/21/2008   Method used:   Print then Give to Patient   RxID:   [id number] BENICAR 20 MG TABS (OLMESARTAN MEDOXOMIL) 1 tablet by mouth daily for blood pressure  #30 x 3   Entered and Authorized by:   Lehman Prom FNP   Signed by:  Lehman Prom FNP on 08/21/2008   Method used:   Print then Give to Patient   RxID:   [id number] FOLIC ACID 1 MG TABS (FOLIC ACID) take one tablet by mouth every day  #30 x 3   Entered and Authorized by:   Lehman Prom FNP   Signed by:   Lehman Prom FNP on 08/21/2008   Method used:   Print then Give to Patient   RxID:   [id number] FERROUS SULFATE 325 (65 FE) MG TBEC (FERROUS SULFATE) take one tablet twice a day  #60 x 3   Entered and Authorized by:   Lehman Prom FNP   Signed by:   Lehman Prom FNP on 08/21/2008   Method used:   Print then Give to Patient   RxID:   [id number]  ]  Last LDL:                                                 147 (05/01/2008 9:55:00 PM)        Diabetic Foot Exam    10-g (5.07) Semmes-Weinstein Monofilament Test Performed by: Levon Hedger          Right Foot          Left Foot Visual  Inspection               Test Control      normal         normal Site 1         normal         normal Site 2         normal         normal Site 3         normal         normal Site 4         normal         normal Site 5         normal         normal Site 6         normal         normal Site 7         normal         normal Site 8         normal         normal Site 9         normal         normal Site 10         normal         normal  Impression      normal         normal   Laboratory Results   Blood Tests   Date/Time Recieved: August 21, 2008 11:46 AM   HGBA1C: 6.5%   (Normal Range: Non-Diabetic - 3-6%   Control Diabetic - 6-8%) CBG Random: 224       X-ray  Procedure date:  08/01/2008  Findings:      left shoulder - degeneration changes and subacromial spurring but no acute injury findings  CT Brain  Procedure date:  08/02/2008  Findings:      chronic microvascular white matter disease and old right basal ganglia infaract no definate acute abnormality or significant interval changes  CXR  Procedure date:  08/01/2008  Findings:      stable mild cardiomegly and mild chronic bronchitic changes.  no acute abnormalitiy  Appended Document: Diabetes/Left arm pain        Current Allergies: No known allergies         Complete Medication List: 1)  Janumet 50-500 Mg Tabs (Sitagliptin-metformin hcl) .Marland Kitchen.. 1 tablet by mouth daily for blood sugar 2)  Ultram 50 Mg Tabs (Tramadol hcl) .Marland Kitchen.. 1 tablet by mouth two times a day for pain as needed 3)  Proventil Hfa 108 (90 Base) Mcg/act Aers (Albuterol sulfate) .... 2 puffs every 6 hours as needed for shortness of breath 4)  Glucometer Elite Classic Kit (Blood glucose monitoring suppl) .... Dispense glucometer (whatever available) along with test strips, and lancets.  dx 250.00 check blood sugar once daily. 5)  Norvasc 10 Mg Tabs (Amlodipine besylate) .Marland Kitchen.. 1 tablet by mouth daily for blood pressure 6)  Aspirin  81 Mg Tbec (Aspirin) .Marland Kitchen.. 1 tablet by mouth daily 7)  Lipitor 20 Mg Tabs (Atorvastatin calcium) .Marland Kitchen.. 1 tablet by mouth at night for cholesterol 8)  Flexeril 5 Mg Tabs (Cyclobenzaprine hcl) .Marland Kitchen.. 1 tablet by mouth nightly as needed for muscles 9)  Flector 1.3 % Ptch (Diclofenac epolamine) .... Apply 1 patch to affected area on for 12 hours then off for 12 hours 10)  Benicar 20 Mg Tabs (Olmesartan medoxomil) .Marland Kitchen.. 1 tablet by mouth daily for blood pressure 11)  Ferrous Sulfate 325 (65 Fe) Mg Tbec (Ferrous sulfate) .... Take one tablet twice a day 12)  Folic Acid 1 Mg Tabs (Folic acid) .... Take one tablet by mouth every day 13)  Colace 100 Mg Caps (Docusate sodium) .... Take one tablet two time a day    ] Laboratory Results   Urine Tests  Date/Time Received: August 21, 2008 4:17 PM  Date/Time Reported: August 21, 2008 4:17 PM   Routine Urinalysis   Color: lt. yellow Appearance: Cloudy Glucose: negative   (Normal Range: Negative) Bilirubin: negative   (Normal Range: Negative) Ketone: negative   (Normal Range: Negative) Spec. Gravity: 1.025   (Normal Range: 1.003-1.035) Blood: negative   (Normal Range: Negative) pH: 5.5   (Normal Range: 5.0-8.0) Protein: 30   (Normal Range: Negative) Urobilinogen: 0.2   (Normal Range: 0-1) Nitrite: negative   (Normal Range: Negative) Leukocyte Esterace: negative   (Normal Range: Negative)     Blood Tests   Date/Time Received: August 21, 2008 4:19 PM  Date/Time Reported: August 21, 2008 4:20 PM   HGBA1C: 6.5%   (Normal Range: Non-Diabetic - 3-6%   Control Diabetic - 6-8%)

## 2011-01-05 NOTE — Assessment & Plan Note (Signed)
Summary: Diabetes/Right knee arthritis   Vital Signs:  Patient Profile:   70 Years Old Female Height:     58 inches Weight:      313 pounds BMI:     65.65 BSA:     2.21 Temp:     97.2 degrees F oral Pulse rate:   82 / minute Pulse rhythm:   regular Resp:     20 per minute BP sitting:   140 / 90  (left arm) Cuff size:   large  Pt. in pain?   yes    Location:   leg  Vitals Entered By: Levon Hedger (February 02, 2008 8:46 AM)              Is Patient Diabetic? Yes  CBG Result 116 CBG Device ID A  Does patient need assistance? Ambulation Normal     Visit Type:  Chronic PCP:  Daphine Deutscher  Chief Complaint:  2 month follow-up DM/ ua.  History of Present Illness:  Pt into office for diabetes follow-up.  Knee pain - pt did get the x-ray as ordered.  Pt reports that right knee is still hurting.  She is taking celebrex which does give minimal relief. She also has some ultram that she takes as needed for knee pain.  she usually has to take it at night.  Pt was told that she needs knee replacement.  She reports that she had MRI or CT within the past year but radiology has been unable to find the report. pt had pedicure done since her last visit here. she is still not able to bear weight for extended periods of time on this extremity.   HTN - Pt has been taking medications from her previous provider because she was getting samples.  She was informed that she would have to change to another BP meds once the samples were complete because she would have to get a med that was available through [facility]   She does have a hobby of crocheting.  She does not do much physical activity due to her knee pain.  Asthma History:  Asthma Severity Index Determination:      Asthma severity index questions reveal daytime symptoms fewer than 3 times per week and 3-4 nighttime symptoms per month.  Goals of Therapy:      The patient notes minimal or no chronic symptoms day or night, minimal or no  exacerbations, no limitations on activity, no school/parent's missing work, minimal use of short-acting inhaled B2 agonists, and minimal or no adverse effects from medications.    Immunizations:      Updated pneumovax: Pneumovax      Last Flu shot: given (12/20/2007)  Diabetes Management History:      The patient is a 70 years old female who comes in for evaluation of DM Type 2.  She has not been enrolled in the "Diabetic Education Program".  She states understanding of dietary principles and is following her diet appropriately.  No sensory loss is reported.  Self foot exams are not being performed.  She is not checking home blood sugars.  She says that she is not exercising regularly.        Hypoglycemic symptoms are not occurring.  No hyperglycemic symptoms are reported.  Other comments include: She has gone to see the diabetes educator.  she is checking blood sugar twice daily as per her instruction. Notes that she had a blood sugar values over 200.  She reports that she called Susie Piper  with this abnormal value.        There are no symptoms to suggest diabetic complications.  No changes have been made to her treatment plan since last visit.        Prior Medication List:  EXFORGE 10-160 MG  TABS (AMLODIPINE BESYLATE-VALSARTAN) 1 tablet by mouth daily for blood pressure DIOVAN HCT 160-25 MG  TABS (VALSARTAN-HYDROCHLOROTHIAZIDE) 1 tablet by mouth daily for blood pressure JANUMET 50-500 MG  TABS (SITAGLIPTIN-METFORMIN HCL) 1 tablet by mouth daily for blood sugar ULTRAM 50 MG  TABS (TRAMADOL HCL) 1 tablet by mouth two times a day for pain as needed CELEBREX 200 MG  CAPS (CELECOXIB) 1 tablet by mouth daily for knee PROVENTIL HFA 108 (90 BASE) MCG/ACT  AERS (ALBUTEROL SULFATE) 2 puffs every 6 hours as needed for shortness of breath GLUCOMETER ELITE CLASSIC   KIT (BLOOD GLUCOSE MONITORING SUPPL) Dispense glucometer (whatever available) along with test strips, and lancets.  Dx 250.00 Check blood  sugar once daily. MULTIVITAMIN/IRON   TABS (MULTIPLE VITAMINS-IRON) 1 tablet by mouth daily to build up blood PRAVACHOL 40 MG  TABS (PRAVASTATIN SODIUM) 1 tablet by mouth nightly for cholesterol   Current Allergies: No known allergies     Risk Factors:  Tobacco use:  current    Counseled to quit/cut down tobacco use:  yes Drug use:  no Caffeine use:  3 drinks per day Alcohol use:  yes    Type:  alcohol Exercise:  no   Review of Systems  General      Denies chills, fatigue, and fever.  CV      Denies chest pain or discomfort, fatigue, and shortness of breath with exertion.  Resp      Denies cough and shortness of breath.  GI      Denies abdominal pain, nausea, and vomiting.  MS      Denies joint pain.      Right knee pain   Physical Exam  General:     alert and overweight-appearing.   Head:     normocephalic.   Eyes:     pupils round.  glasses Ears:     R ear normal and L ear normal.   Nose:     no external deformity.   Mouth:     fair dentition.   Neck:     supple.   Lungs:     normal respiratory effort, no intercostal retractions, no accessory muscle use, and normal breath sounds.   Heart:     normal rate, regular rhythm, no murmur, and no gallop.   Abdomen:     soft, non-tender, and normal bowel sounds.  obese Msk:     Right knee pain with ROM Right bunion - slight inflammation Neurologic:     alert & oriented X3.   Skin:     color normal.   Psych:     Oriented X3.   Wobbly gait    Impression & Recommendations:  Problem # 1:  DIABETES MELLITUS, TYPE II, UNCONTROLLED (ICD-250.02) pt did not bring blood sugar log into the office today.  advised that she still needs to take meds daily and check blood sugar. Pneumovax given according to diabetic protocol. The following medications were removed from the medication list:    Diovan Hct 160-25 Mg Tabs (Valsartan-hydrochlorothiazide) .Marland Kitchen... 1 tablet by mouth daily for blood pressure  Her  updated medication list for this problem includes:    Janumet 50-500 Mg Tabs (Sitagliptin-metformin hcl) .Marland Kitchen... 1 tablet by mouth  daily for blood sugar    Avalide 150-12.5 Mg Tabs (Irbesartan-hydrochlorothiazide) .Marland Kitchen... 1 tablet by mouth daily for blood pressure    Aspirin 81 Mg Tbec (Aspirin) .Marland Kitchen... 1 tablet by mouth daily  Orders: T-Urine Microalbumin w/creat. ratio (16109 / [id number])   Problem # 2:  UNSPECIFIED ARTHROPATHY, LOWER LEG (ICD-716.96) Will refer to ortho. advised pt that she still needs to make efforts at weight loss Orders: Orthopedic Referral (Ortho)   Problem # 3:  ESSENTIAL HYPERTENSION, BENIGN (ICD-401.1) medications changed to get meds available at [facility] [facility] The following medications were removed from the medication list:    Exforge 10-160 Mg Tabs (Amlodipine besylate-valsartan) .Marland Kitchen... 1 tablet by mouth daily for blood pressure    Diovan Hct 160-25 Mg Tabs (Valsartan-hydrochlorothiazide) .Marland Kitchen... 1 tablet by mouth daily for blood pressure  Her updated medication list for this problem includes:    Avalide 150-12.5 Mg Tabs (Irbesartan-hydrochlorothiazide) .Marland Kitchen... 1 tablet by mouth daily for blood pressure    Norvasc 10 Mg Tabs (Amlodipine besylate) .Marland Kitchen... 1 tablet by mouth daily for blood pressure   Problem # 4:  OBESITY, MORBID (ICD-278.01) advised pt that wieght loss will help to improve both diabetes and joint pain.  Problem # 5:  PROTEINURIA (ICD-791.0) pt started on ARB.  Complete Medication List: 1)  Janumet 50-500 Mg Tabs (Sitagliptin-metformin hcl) .Marland Kitchen.. 1 tablet by mouth daily for blood sugar 2)  Ultram 50 Mg Tabs (Tramadol hcl) .Marland Kitchen.. 1 tablet by mouth two times a day for pain as needed 3)  Celebrex 200 Mg Caps (Celecoxib) .Marland Kitchen.. 1 tablet by mouth daily for knee 4)  Proventil Hfa 108 (90 Base) Mcg/act Aers (Albuterol sulfate) .... 2 puffs every 6 hours as needed for shortness of breath 5)  Glucometer Elite Classic Kit (Blood glucose monitoring suppl) ....  Dispense glucometer (whatever available) along with test strips, and lancets.  dx 250.00 check blood sugar once daily. 6)  Multivitamin/iron Tabs (Multiple vitamins-iron) .Marland Kitchen.. 1 tablet by mouth daily to build up blood 7)  Pravachol 40 Mg Tabs (Pravastatin sodium) .Marland Kitchen.. 1 tablet by mouth nightly for cholesterol 8)  Avalide 150-12.5 Mg Tabs (Irbesartan-hydrochlorothiazide) .Marland Kitchen.. 1 tablet by mouth daily for blood pressure 9)  Norvasc 10 Mg Tabs (Amlodipine besylate) .Marland Kitchen.. 1 tablet by mouth daily for blood pressure 10)  Aspirin 81 Mg Tbec (Aspirin) .Marland Kitchen.. 1 tablet by mouth daily  Other Orders: Pneumococcal Vaccine (19147) Admin 1st Vaccine (82956)  Diabetes Management Assessment/Plan:      The following lipid goals have been established for the patient: Total cholesterol goal of 200; LDL cholesterol goal of 100; HDL cholesterol goal of 40; Triglyceride goal of 200.  Her blood pressure goal is < 130/80.     Patient Instructions: 1)  This office will refer you to ortho for evaluation of right knee pain.  Keep in mind that they will require a co-payment at the time of service and they will let us know how much when your appointment is made. 2)  Start new blood pressure medications.   3)  Follow up in 2 weeks for nurse visit - blood pressure check to be sure new blood pressure meds are ok.   4)  Follow up in 6-8 weeks for diabetes follow up.  Continue to check blood sugar at home and record values.    Prescriptions: NORVASC 10 MG  TABS (AMLODIPINE BESYLATE) 1 tablet by mouth daily for blood pressure  #30 x 3   Entered and Authorized by:   Lehman Prom FNP  Signed by:   Lehman Prom FNP on 02/02/2008   Method used:   Print then Give to Patient   RxID:   [id number] AVALIDE 150-12.5 MG  TABS (IRBESARTAN-HYDROCHLOROTHIAZIDE) 1 tablet by mouth daily for blood pressure  #30 x 3   Entered and Authorized by:   Lehman Prom FNP   Signed by:   Lehman Prom FNP on 02/02/2008   Method used:    Print then Give to Patient   RxID:   [id number]  ] Laboratory Results   Urine Tests  Date/Time Received: February 02, 2008 9:01 AM  Date/Time Reported: February 02, 2008 9:01 AM   Routine Urinalysis   Color: dk yellow Appearance: Hazy Bilirubin: small   (Normal Range: Negative) Ketone: trace (5)   (Normal Range: Negative) Spec. Gravity: 1.020   (Normal Range: 1.003-1.035) Blood: large   (Normal Range: Negative) pH: 6.5   (Normal Range: 5.0-8.0) Protein: >=300   (Normal Range: Negative) Urobilinogen: 0.2   (Normal Range: 0-1) Leukocyte Esterace: large   (Normal Range: Negative)     Blood Tests     CBG Random:: 116      Last LDL:                                                 188 (01/03/2008 9:36:00 PM)          Diabetic Foot Exam Foot Inspection Is there a history of a foot ulcer?              No Is there a foot ulcer now?              No Can the patient see the bottom of their feet?          No Are the shoes appropriate in style and fit?          Yes Is there swelling or an abnormal foot shape?          Yes Are the toenails long?                No Are the toenails thick?                No Are the toenails ingrown?              No Is there heavy callous build-up?              No Is there a claw toe deformity?                          No Is there elevated skin temperature?            No Is there limited ankle dorsiflexion?            No Is there foot or ankle muscle weakness?            No Do you have pain in calf while walking?           No      Pulse Check          Right Foot          Left Foot Dorsalis Pedis:        1+            0  High Risk Feet? Yes Set Next  Diabetic Foot Exam here: 03/19/2008    Pneumovax Vaccine    Vaccine Type: Pneumovax    Site: left deltoid    Mfr: Merck    Dose: 0.5 ml    Route: IM    Given by: Levon Hedger    Exp. Date: 04/27/2009    Lot #: [id number]    VIS given: 07/03/96 version given February 02, 2008.  ndc  [id number]

## 2011-01-05 NOTE — Procedures (Signed)
Summary: Gastroenterology/COLONOSCOPY  Gastroenterology/COLONOSCOPY   Imported By: Arta Bruce 10/28/2008 16:06:31  _____________________________________________________________________  External Attachment:    Type:   Image     Comment:   External Document

## 2011-01-05 NOTE — Progress Notes (Signed)
Summary: Nausea  Phone Note Call from Patient   Summary of Call: [name] PT. SHE HAS BEEN FEELING VERY NASUATED, WOOZY, BLOATED AND NO APPETITE. SHE CHECKED HER BLOOD SUGAR LEVELS THIS MORNING, AND IT WAS 135. SHE'S BEEN FEELING THIS WAYFOR A WEEK AND SHE THOUGHT IT MIGHT PASS.  Levon Hedger  April 04, 2008 3:40 PM Spoke with pt she also says she thinks if she could throw up she might feel better, and she states she is having some chills but no fever.  She said she has been eating toast, crackers, and drinking water.  I told her I would get this to [facility] and get back with her. Initial call taken by: Leodis Rains,  April 04, 2008 11:15 AM  Follow-up for Phone Call        pt is doing all the right steps. I will send her a short course of reglan to pharmacy which she needs to take 30 minutes before meals twice daily. she needs to eat small  meals so that her stomach is too full. keep a close watch on her blood sugars maybe check twice daily while she is sick Rx in basket.  Be sure she uses [facility] if no better by next Monday she needs to call for appt Follow-up by: Lehman Prom FNP,  April 04, 2008 4:05 PM  Additional Follow-up for Phone Call Additional follow up Details #1::        Phone Call Completed, rx faxed. Additional Follow-up by: Murvin Natal ,  April 04, 2008 4:30 PM    New/Updated Medications: REGLAN 10 MG  TABS (METOCLOPRAMIDE HCL) 1 tablet by mouth two times a day before meals   Prescriptions: REGLAN 10 MG  TABS (METOCLOPRAMIDE HCL) 1 tablet by mouth two times a day before meals  #20 x 0   Entered and Authorized by:   Lehman Prom FNP   Signed by:   Lehman Prom FNP on 04/04/2008   Method used:   Printed then faxed to ...         RxID:   [id number]

## 2011-01-05 NOTE — Progress Notes (Signed)
Summary: Rx refills  Phone Note Call from Patient Call back at Home Phone [phone]   Caller: Patient Summary of Call: Ms. Ulrich came on Sept 30 and the provider told her that her medication will be fax but her daughter is at the pharmacy at this moment  but there is nothing for her at the pharmacy.   Please call her back. FNP Daphine Deutscher Initial call taken by: Manon Hilding,  September 06, 2008 12:13 PM  Follow-up for Phone Call        forwarded to provider Follow-up by: Levon Hedger,  September 06, 2008 5:02 PM  Additional Follow-up for Phone Call Additional follow up Details #1::        Spoke with pt--not clear if she's not covered on meds because she is switching Medicare coverage plans or her meds just weren't well covered--would have cost her 300 plus dollars for Rx given on 08/21/08 reportedly.  She has some of her bp meds to get through the weekend.  Please call [facility] on Monday and see if we can get her meds filled there for 1-2 months--I believe they said they cover during "donut holes", so this would be similar.  If they do--please fax them the prescriptions pt. needs (I believe she will eventually need all of them) for 2 month period.  Do not send flexeril or pain meds for now--we'll have N. Daphine Deutscher sort that out on her return--please send this her way after finish.  Thanks Additional Follow-up by: Julieanne Manson MD,  September 06, 2008 6:34 PM    Additional Follow-up for Phone Call Additional follow up Details #2::    Zena Amos called this morning and clarified meds and they are going be called to [facility] pharmacy. Follow-up by: Vesta Mixer CMA,  September 09, 2008 9:53 AM

## 2011-01-05 NOTE — Progress Notes (Signed)
  Phone Note Call from Patient Call back at [facility] Phone [phone]   Caller: Patient Summary of Call: The patient cannot wait until Sep 18 to see her provider because she start having pain when she urinate. FNP Daphine Deutscher Initial call taken by: Manon Hilding,  August 13, 2008 11:52 AM  Follow-up for Phone Call        I CALLED AND TALK WITH PATIENT TO SEE IF SHE CAN WAIT TILL FRIDAY AND SHE IS NOW SCHEDULED FOR 09/16 BECAUSE SHE IS HAVING SHARP PAINS RUNNING DOWN HER ARM. Follow-up by: Leodis Rains,  August 20, 2008 8:28 AM

## 2011-02-23 LAB — GLUCOSE, CAPILLARY
Glucose-Capillary: 102 mg/dL — ABNORMAL HIGH (ref 70–99)
Glucose-Capillary: 104 mg/dL — ABNORMAL HIGH (ref 70–99)
Glucose-Capillary: 106 mg/dL — ABNORMAL HIGH (ref 70–99)
Glucose-Capillary: 111 mg/dL — ABNORMAL HIGH (ref 70–99)
Glucose-Capillary: 113 mg/dL — ABNORMAL HIGH (ref 70–99)
Glucose-Capillary: 135 mg/dL — ABNORMAL HIGH (ref 70–99)
Glucose-Capillary: 177 mg/dL — ABNORMAL HIGH (ref 70–99)
Glucose-Capillary: 83 mg/dL (ref 70–99)
Glucose-Capillary: 88 mg/dL (ref 70–99)

## 2011-02-23 LAB — CBC
HCT: 26.6 % — ABNORMAL LOW (ref 36.0–46.0)
Hemoglobin: 9.1 g/dL — ABNORMAL LOW (ref 12.0–15.0)
MCHC: 34.2 g/dL (ref 30.0–36.0)
MCV: 92 fL (ref 78.0–100.0)
Platelets: 201 10*3/uL (ref 150–400)
RBC: 2.89 MIL/uL — ABNORMAL LOW (ref 3.87–5.11)
RDW: 16 % — ABNORMAL HIGH (ref 11.5–15.5)
WBC: 4.6 10*3/uL (ref 4.0–10.5)

## 2011-02-23 LAB — OCCULT BLOOD (STOOL CUP TO LAB): Fecal Occult Bld: NEGATIVE

## 2011-02-23 LAB — DIFFERENTIAL
Basophils Absolute: 0 10*3/uL (ref 0.0–0.1)
Basophils Relative: 0 % (ref 0–1)
Eosinophils Absolute: 0.1 10*3/uL (ref 0.0–0.7)
Eosinophils Relative: 2 % (ref 0–5)
Lymphocytes Relative: 27 % (ref 12–46)
Lymphs Abs: 1.2 10*3/uL (ref 0.7–4.0)
Monocytes Absolute: 0.4 10*3/uL (ref 0.1–1.0)
Monocytes Relative: 8 % (ref 3–12)
Neutro Abs: 2.8 10*3/uL (ref 1.7–7.7)
Neutrophils Relative %: 62 % (ref 43–77)

## 2011-02-23 LAB — HEMOGLOBIN A1C
Hgb A1c MFr Bld: 7 % — ABNORMAL HIGH (ref <5.7)
Mean Plasma Glucose: 154 mg/dL — ABNORMAL HIGH (ref <117)

## 2011-02-24 LAB — CROSSMATCH
ABO/RH(D): AB POS
Antibody Screen: NEGATIVE

## 2011-02-24 LAB — DIFFERENTIAL
Basophils Absolute: 0 10*3/uL (ref 0.0–0.1)
Basophils Absolute: 0 10*3/uL (ref 0.0–0.1)
Basophils Relative: 0 % (ref 0–1)
Basophils Relative: 1 % (ref 0–1)
Eosinophils Absolute: 0.1 10*3/uL (ref 0.0–0.7)
Eosinophils Absolute: 0.1 10*3/uL (ref 0.0–0.7)
Eosinophils Relative: 1 % (ref 0–5)
Eosinophils Relative: 2 % (ref 0–5)
Lymphocytes Relative: 23 % (ref 12–46)
Lymphocytes Relative: 27 % (ref 12–46)
Lymphs Abs: 1.1 10*3/uL (ref 0.7–4.0)
Lymphs Abs: 1.4 10*3/uL (ref 0.7–4.0)
Monocytes Absolute: 0.4 10*3/uL (ref 0.1–1.0)
Monocytes Absolute: 0.6 10*3/uL (ref 0.1–1.0)
Monocytes Relative: 11 % (ref 3–12)
Monocytes Relative: 8 % (ref 3–12)
Neutro Abs: 3.2 10*3/uL (ref 1.7–7.7)
Neutro Abs: 3.4 10*3/uL (ref 1.7–7.7)
Neutrophils Relative %: 60 % (ref 43–77)
Neutrophils Relative %: 68 % (ref 43–77)

## 2011-02-24 LAB — IRON AND TIBC
Iron: 40 ug/dL — ABNORMAL LOW (ref 42–135)
Saturation Ratios: 10 % — ABNORMAL LOW (ref 20–55)
TIBC: 401 ug/dL (ref 250–470)
UIBC: 361 ug/dL

## 2011-02-24 LAB — BASIC METABOLIC PANEL
BUN: 15 mg/dL (ref 6–23)
BUN: 23 mg/dL (ref 6–23)
CO2: 29 mEq/L (ref 19–32)
CO2: 29 mEq/L (ref 19–32)
Calcium: 8.5 mg/dL (ref 8.4–10.5)
Calcium: 8.5 mg/dL (ref 8.4–10.5)
Chloride: 105 mEq/L (ref 96–112)
Chloride: 106 mEq/L (ref 96–112)
Creatinine, Ser: 0.75 mg/dL (ref 0.4–1.2)
Creatinine, Ser: 1.36 mg/dL — ABNORMAL HIGH (ref 0.4–1.2)
GFR calc Af Amer: 47 mL/min — ABNORMAL LOW (ref >60)
GFR calc Af Amer: >60 mL/min (ref >60)
GFR calc non Af Amer: 39 mL/min — ABNORMAL LOW (ref >60)
GFR calc non Af Amer: >60 mL/min (ref >60)
Glucose, Bld: 114 mg/dL — ABNORMAL HIGH (ref 70–99)
Glucose, Bld: 120 mg/dL — ABNORMAL HIGH (ref 70–99)
Potassium: 3.7 mEq/L (ref 3.5–5.1)
Potassium: 4.2 mEq/L (ref 3.5–5.1)
Sodium: 138 mEq/L (ref 135–145)
Sodium: 140 mEq/L (ref 135–145)

## 2011-02-24 LAB — CBC
HCT: 22.8 % — ABNORMAL LOW (ref 36.0–46.0)
HCT: 27.2 % — ABNORMAL LOW (ref 36.0–46.0)
Hemoglobin: 7.9 g/dL — ABNORMAL LOW (ref 12.0–15.0)
Hemoglobin: 9.4 g/dL — ABNORMAL LOW (ref 12.0–15.0)
MCHC: 34.5 g/dL (ref 30.0–36.0)
MCHC: 34.6 g/dL (ref 30.0–36.0)
MCV: 92.3 fL (ref 78.0–100.0)
MCV: 94 fL (ref 78.0–100.0)
Platelets: 196 10*3/uL (ref 150–400)
Platelets: 224 10*3/uL (ref 150–400)
RBC: 2.43 MIL/uL — ABNORMAL LOW (ref 3.87–5.11)
RBC: 2.95 MIL/uL — ABNORMAL LOW (ref 3.87–5.11)
RDW: 15.4 % (ref 11.5–15.5)
RDW: 16.4 % — ABNORMAL HIGH (ref 11.5–15.5)
WBC: 5 10*3/uL (ref 4.0–10.5)
WBC: 5.3 10*3/uL (ref 4.0–10.5)

## 2011-02-24 LAB — GLUCOSE, CAPILLARY
Glucose-Capillary: 107 mg/dL — ABNORMAL HIGH (ref 70–99)
Glucose-Capillary: 111 mg/dL — ABNORMAL HIGH (ref 70–99)
Glucose-Capillary: 118 mg/dL — ABNORMAL HIGH (ref 70–99)
Glucose-Capillary: 139 mg/dL — ABNORMAL HIGH (ref 70–99)
Glucose-Capillary: 139 mg/dL — ABNORMAL HIGH (ref 70–99)
Glucose-Capillary: 74 mg/dL (ref 70–99)
Glucose-Capillary: 82 mg/dL (ref 70–99)
Glucose-Capillary: 92 mg/dL (ref 70–99)
Glucose-Capillary: 95 mg/dL (ref 70–99)

## 2011-02-24 LAB — LIPID PANEL
Cholesterol: 211 mg/dL — ABNORMAL HIGH (ref 0–200)
HDL: 61 mg/dL (ref >39)
LDL Cholesterol: 126 mg/dL — ABNORMAL HIGH (ref 0–99)
Total CHOL/HDL Ratio: 3.5 RATIO
Triglycerides: 121 mg/dL (ref <150)
VLDL: 24 mg/dL (ref 0–40)

## 2011-02-24 LAB — POCT CARDIAC MARKERS
CKMB, poc: 2.1 ng/mL (ref 1.0–8.0)
CKMB, poc: <1 ng/mL — ABNORMAL LOW (ref 1.0–8.0)
Myoglobin, poc: 61 ng/mL (ref 12–200)
Myoglobin, poc: 65 ng/mL (ref 12–200)
Troponin i, poc: <0.05 ng/mL (ref 0.00–0.09)
Troponin i, poc: <0.05 ng/mL (ref 0.00–0.09)

## 2011-02-24 LAB — RETICULOCYTES
RBC.: 2.42 MIL/uL — ABNORMAL LOW (ref 3.87–5.11)
Retic Count, Absolute: 89.5 10*3/uL (ref 19.0–186.0)
Retic Ct Pct: 3.7 % — ABNORMAL HIGH (ref 0.4–3.1)

## 2011-02-24 LAB — PROTIME-INR
INR: 0.91 (ref 0.00–1.49)
Prothrombin Time: 12.2 seconds (ref 11.6–15.2)

## 2011-02-24 LAB — VITAMIN B12
Vitamin B-12: 388 pg/mL (ref 211–911)
Vitamin B-12: 396 pg/mL (ref 211–911)

## 2011-02-24 LAB — FERRITIN: Ferritin: 6 ng/mL — ABNORMAL LOW (ref 10–291)

## 2011-02-24 LAB — HEMOCCULT GUIAC POC 1CARD (OFFICE): Fecal Occult Bld: NEGATIVE

## 2011-02-24 LAB — HOMOCYSTEINE: Homocysteine: 5.2 umol/L (ref 4.0–15.4)

## 2011-02-24 LAB — FOLATE: Folate: 13.2 ng/mL

## 2011-02-24 LAB — ABO/RH: ABO/RH(D): AB POS

## 2011-04-20 NOTE — Discharge Summary (Signed)
[name], [name]              ACCOUNT NO.:  [id number]   MEDICAL RECORD NO.:  [id number]          PATIENT TYPE:  INP   LOCATION:  4702                         FACILITY:  [facility]   PHYSICIAN:  Marcellus Scott, MD     DATE OF BIRTH:  1941/06/21   DATE OF ADMISSION:  08/01/2008  DATE OF DISCHARGE:  08/04/2008                               DISCHARGE SUMMARY   PRIMARY MEDICAL DOCTOR:  [employer].   DISCHARGE DIAGNOSES:  1. Sepsis secondary to urinary tract infection.  2. Urinary tract infection.  3. Coagulase-negative Staphylococcus aureus on single blood culture.      Possibly a contaminant.  4. Acute renal failure.  5. Chronic anemia.  Question iron and folate deficiency.  6. Left shoulder pain - improved.  7. Type 2 diabetes.  8. Hypertension.  9. Old cerebrovascular accident.   DISCHARGE MEDICATIONS:  1. Amlodipine 10 mg p.o. daily.  2. Janumet 50/500 one p.o. daily.  3. Lipitor 20 mg p.o. q.h.s.  4. Meloxicam 15 mg p.o. daily.  5. Tramadol 50 mg p.o. b.i.d. p.r.n.  6. Ferrous sulfate 325 mg p.o. b.i.d.  7. Folate 1 mg p.o. daily.  8. Cipro 500 mg p.o. b.i.d.  9. Benicar 20 mg p.o. daily.  10.Colace 100 mg p.o. b.i.d.  11.Enteric-coated aspirin 81 mg p.o. daily.   DISCONTINUED MEDICATION:  Avalide.   PROCEDURES:  1. X-ray of the left shoulder on August 02, 2008.  Impression:      Degenerative changes and subacromial spurring but no acute bony      findings.  2. CT of the head without contract from August 02, 2008.  Impression:      a.     Chronic microvascular white matter disease and old right       basal ganglion infarct.      b.     No definite acute abnormality or significant interval       change.  3. Chest x-ray on August 01, 2008.  Impression:  Stable mild      cardiomegaly and mild chronic bronchitic changes.  No acute      abnormality.   PERTINENT LABS:  Basic metabolic panel today:  BUN 12, creatinine 1.01.  CBC:  Hemoglobin 8.8, hematocrit  26, white blood cells 5.1, platelets  200, MCV 95.3.  Blood culture on August 01, 2008 x1, no growth to date.  Blood cultures on August 01, 2008 x1 with coagulase-negative Staph  aureus.  Urine culture with greater than 100,000 colonies/mL - multiple  bacterial morphocytes present, suggesting contamination.  Anemia panel  with absolute reticulocytes 45, iron 25, total iron-binding capacity  330, percentage saturation 8, vitamin B12 453, serum folate 3.5,  ferritin 55.  Cardiac panel cycled x3 and negative for MI.  Urinalysis  with too-numerous-to-count white blood cells and a few bacteria.   Admitting creatinine of 1.74.  BUN 19.   CONSULTATIONS:  None.   HOSPITAL COURSE AND PATIENT DISPOSITION:  Please refer to the history  and physical for initial admission details.  In summary, [name] is  a pleasant 70 year old African American female  patient with a history of  type 2 diabetes, hypertension, morbid obesity, hyperlipidemia,  osteoarthritis awaiting left total knee arthroplasty who presented with  a history of fever, chills, malaise.  Evaluation in the emergency room  revealed the patient to be febrile with a temperature of 101.4 degrees  Fahrenheit and hypotensive with a blood pressure of 81/52 mmHg.  Further  evaluation was suggestive of urinary tract infection.  The patient was  admitted to the hospital for further evaluation and management.  1. Sepsis.  The patient was admitted to the hospital.  Blood and urine      cultures were sent off.  Her hypotension resolved with IV fluids.      Telemetry did not reveal any arrhythmia.  Her sepsis has clinically      resolved.  2. Urinary tract infection.  The patient was placed empirically on IV      ciprofloxacin.  She has had no further chills or rigors.  She is      afebrile without any leukocytosis.  Unfortunately, her urine sample      seems to be a contaminant.  Will complete a week's total of p.o.      ciprofloxacin.  3.  Single blood culture with coagulase-negative Staph.  Possibly a      contaminant.  4. Acute on chronic kidney disease secondary to dehydration,      diuretics, and ARB.  The patient's hydrochlorothiazide was      discontinued.  She was hydrated with IV fluids and metformin was      held.  With these measures, the patient's creatinine has improved      significantly.  Would suggest discontinuing her hydrochlorothiazide      and continue with the ARB alone and monitor her basic metabolic      panel in a week's time.  5. Chronic anemia.  If the patient has not had a workup as an      outpatient, consider GI evaluation.  6. Hypertension.  The patient's blood pressure is fairly controlled      with blood pressures ranging from 100-130/50-70 mmHg.  7. The patient has ambulated in the hallway with the nurses without      any difficulty or symptoms.  The patient, at this time, is stable      to be discharged home to follow up with her primary medical doctor      in a week's time.      Marcellus Scott, MD  Electronically Signed     AH/MEDQ  D:  08/04/2008  T:  08/04/2008  Job:  [id number]   cc:   Melvern Banker

## 2011-04-20 NOTE — H&P (Signed)
[name], [name]              ACCOUNT NO.:  [id number]   MEDICAL RECORD NO.:  [id number]          PATIENT TYPE:  EMS   LOCATION:  MAJO                         FACILITY:  [facility]   PHYSICIAN:  Raphael Gibney, MD        DATE OF BIRTH:  03-23-1941   DATE OF ADMISSION:  08/01/2008  DATE OF DISCHARGE:                              HISTORY & PHYSICAL   PRIMARY CARE PHYSICIAN:  Unassigned.   CHIEF COMPLAINT:  Fever, chills, and malaise of one day's duration.   HISTORY OF PRESENT ILLNESS:  [name] is a morbidly obese 70-year-  old African American female, who presents to our emergency department  with complaints of fevers, chills, malaise, low grade nausea, and fevers  of one day's duration.  She states that she has been feeling cold since  11:00 a.m. this morning.  She has not measured her temperature, although  has been having chills.  She decided to comes to the emergency  department for the same complaints.  She states that she has also been  feeling nauseous and hence, has not been able to eat anything today.  On  arrival to the emergency department, she was noted to have a fever of  101.4.  She continued to feel cold.  Her blood pressure sitting up  dropped to 81/52.  Standing blood pressure could not be obtained.  She  denied any sick contact or any other problems before.  She also gave a  history of some amount of left shoulder pain radiating from the left  precordium.  She was unable to state if she worsened on exertion.  It  was not associated with any shortness of breath.  She was tachycardic at  115 at the time of her initial ED stay.   PAST MEDICAL HISTORY:  1. Hypertension.  2. Hypercholesterolemia.  3. Morbid obesity.  4. Diabetes mellitus.  5. Knee osteoarthritis.   PAST SURGICAL HISTORY:  Steroid injection to the knee.   ALLERGIES:  NKDA.   SOCIAL HISTORY:  She states that she smoked about a half pack per day  for about 30 years.  Quit two months ago.  Denies  any significant  history of tobacco, alcohol, or illicit drug use.   FAMILY HISTORY:  Denies any significant history of any diseases per her  and the family.   MEDICATIONS:  1. Amlodipine 10 mg p.o. daily.  2. Avalide 1 tablet 150/12.5 p.o. daily.  3. Janumet 50 mg p.o. daily.  4. Lipitor 20 mg 1 tablet p.o. nightly.  5. Meloxicam 150 mg p.o. daily.  6. Pravastatin 40 mg p.o. nightly.  7. Tramadol 50 mg 1 tablet p.o. b.i.d.   REVIEW OF SYSTEMS:  A detailed review of systems was performed and was  felt to be negative other than those mentioned in the HPI.   PHYSICAL EXAMINATION:  VITAL SIGNS:  Temperature 101.4, blood pressure  81/52 sitting, 120/58 lying down, pulse rate 84.  HEENT:  Pupils are equal and reactive to light.  Extraocular movements  are intact.  No pallor or icterus noted.  CARDIOVASCULAR:  Tachycardic.  Regular rate and rhythm.  RESPIRATORY:  Clear to auscultation bilaterally.  ABDOMEN:  Distended.  Unable to palpate for any organomegaly.  EXTREMITIES:  No obvious pitting edema or cyanosis.   LABORATORY DATA:  WBC 8, hemoglobin 9.4, platelets 244.  Sodium 137,  potassium 3.8, chloride 103, carbon dioxide 25, glucose 121, BUN 19,  creatinine 1.7, calcium 8.8, albumin 3.  UA:  Leukocyte esterase was  large.  No bacteria noted.  Urine nitrates negative.  Urine bacteria is  few.   CHEST X-RAY:  Stable mild cardiomegaly, mild chronic bronchitic changes.  No acute abnormality noted.   IMPRESSION:  1. Fevers with chills.  2. Mild urinary tract infection.  3. Rule out influenza.  4. Diabetes mellitus.  5. Hypotension.   PLAN:  1. Will admit to [facility].  2. Admit to telemetry.  3. EKG in the a.m.  4. Cardiac isoenzymes.  Troponin q.8h.  CBC and CMP in the a.m.  5. Pan culture with blood culture x2.  Start IV Cipro 400 mg q.24h.  6. Swab for influenza, especially H1N1.  7. CT scan of the head without contrast.  8. IV fluids, normal saline with 20 mEq  of potassium at 125 ml/hr.  9. Bedrest, fall precautions.  10.DVT prophylaxis with Lovenox 40 mg q.24h.  11.GI prophylaxis with Protonix 40 mg daily.      Raphael Gibney, MD  Electronically Signed     HV/MEDQ  D:  08/01/2008  T:  08/01/2008  Job:  [id number]

## 2011-07-16 ENCOUNTER — Emergency Department (HOSPITAL_COMMUNITY): Payer: Medicare Other

## 2011-07-16 ENCOUNTER — Inpatient Hospital Stay (HOSPITAL_COMMUNITY)
Admission: EM | Admit: 2011-07-16 | Discharge: 2011-07-19 | DRG: 069 | Disposition: A | Discharge status: Home / Self Care | Payer: Medicare Other | Attending: Internal Medicine | Admitting: Internal Medicine

## 2011-07-16 ENCOUNTER — Other Ambulatory Visit: Payer: Self-pay

## 2011-07-16 ENCOUNTER — Encounter: Payer: Self-pay | Admitting: *Deleted

## 2011-07-16 DIAGNOSIS — G8929 Other chronic pain: Secondary | ICD-10-CM | POA: Diagnosis present

## 2011-07-16 DIAGNOSIS — G459 Transient cerebral ischemic attack, unspecified: Principal | ICD-10-CM | POA: Diagnosis present

## 2011-07-16 DIAGNOSIS — I10 Essential (primary) hypertension: Secondary | ICD-10-CM | POA: Diagnosis present

## 2011-07-16 DIAGNOSIS — M549 Dorsalgia, unspecified: Secondary | ICD-10-CM | POA: Diagnosis present

## 2011-07-16 DIAGNOSIS — E119 Type 2 diabetes mellitus without complications: Secondary | ICD-10-CM | POA: Diagnosis present

## 2011-07-16 DIAGNOSIS — D649 Anemia, unspecified: Secondary | ICD-10-CM | POA: Diagnosis present

## 2011-07-16 DIAGNOSIS — M1990 Unspecified osteoarthritis, unspecified site: Secondary | ICD-10-CM | POA: Diagnosis present

## 2011-07-16 HISTORY — DX: Pain in unspecified knee: M25.569

## 2011-07-16 HISTORY — DX: Other chronic pain: G89.29

## 2011-07-16 HISTORY — DX: Dorsalgia, unspecified: M54.9

## 2011-07-16 LAB — BASIC METABOLIC PANEL
BUN: 19 mg/dL (ref 6–23)
CO2: 25 mEq/L (ref 19–32)
Calcium: 8.7 mg/dL (ref 8.4–10.5)
Chloride: 103 mEq/L (ref 96–112)
Creatinine, Ser: 1.03 mg/dL (ref 0.50–1.10)
GFR calc Af Amer: >60 mL/min (ref >60)
GFR calc non Af Amer: 53 mL/min — ABNORMAL LOW (ref >60)
Glucose, Bld: 138 mg/dL — ABNORMAL HIGH (ref 70–99)
Potassium: 4.2 mEq/L (ref 3.5–5.1)
Sodium: 138 mEq/L (ref 135–145)

## 2011-07-16 LAB — DIFFERENTIAL
Basophils Absolute: 0 10*3/uL (ref 0.0–0.1)
Basophils Relative: 0 % (ref 0–1)
Eosinophils Absolute: 0.1 10*3/uL (ref 0.0–0.7)
Eosinophils Relative: 2 % (ref 0–5)
Lymphocytes Relative: 32 % (ref 12–46)
Lymphs Abs: 1.3 10*3/uL (ref 0.7–4.0)
Monocytes Absolute: 0.3 10*3/uL (ref 0.1–1.0)
Monocytes Relative: 7 % (ref 3–12)
Neutro Abs: 2.3 10*3/uL (ref 1.7–7.7)
Neutrophils Relative %: 59 % (ref 43–77)

## 2011-07-16 LAB — CBC
HCT: 31.1 % — ABNORMAL LOW (ref 36.0–46.0)
Hemoglobin: 10.1 g/dL — ABNORMAL LOW (ref 12.0–15.0)
MCH: 32 pg (ref 26.0–34.0)
MCHC: 32.5 g/dL (ref 30.0–36.0)
MCV: 98.4 fL (ref 78.0–100.0)
Platelets: 213 10*3/uL (ref 150–400)
RBC: 3.16 MIL/uL — ABNORMAL LOW (ref 3.87–5.11)
RDW: 14.7 % (ref 11.5–15.5)
WBC: 3.9 10*3/uL — ABNORMAL LOW (ref 4.0–10.5)

## 2011-07-16 LAB — PROTIME-INR
INR: 0.93 (ref 0.00–1.49)
Prothrombin Time: 12.7 seconds (ref 11.6–15.2)

## 2011-07-16 LAB — GLUCOSE, CAPILLARY: Glucose-Capillary: 144 mg/dL — ABNORMAL HIGH (ref 70–99)

## 2011-07-16 LAB — APTT: aPTT: 27 seconds (ref 24–37)

## 2011-07-16 LAB — TROPONIN I: Troponin I: <0.3 ng/mL (ref <0.30)

## 2011-07-16 MED ORDER — CYCLOBENZAPRINE HCL 10 MG PO TABS
10.0000 mg | ORAL_TABLET | Freq: Two times a day (BID) | ORAL | Status: DC
  Administered 2011-07-16 – 2011-07-19 (×6): 10 mg via ORAL
  Filled 2011-07-16 (×6): qty 1

## 2011-07-16 MED ORDER — OLMESARTAN MEDOXOMIL 20 MG PO TABS
40.0000 mg | ORAL_TABLET | Freq: Every day | ORAL | Status: DC
  Administered 2011-07-16 – 2011-07-19 (×4): 40 mg via ORAL
  Filled 2011-07-16 (×4): qty 2

## 2011-07-16 MED ORDER — ASPIRIN 81 MG PO CHEW
81.0000 mg | CHEWABLE_TABLET | Freq: Every day | ORAL | Status: DC
  Administered 2011-07-16 – 2011-07-19 (×4): 81 mg via ORAL
  Filled 2011-07-16 (×4): qty 1

## 2011-07-16 MED ORDER — AMLODIPINE BESYLATE 5 MG PO TABS
10.0000 mg | ORAL_TABLET | Freq: Every day | ORAL | Status: DC
  Administered 2011-07-16 – 2011-07-19 (×4): 10 mg via ORAL
  Filled 2011-07-16: qty 1
  Filled 2011-07-16 (×3): qty 2

## 2011-07-16 MED ORDER — CHOLECALCIFEROL 10 MCG (400 UNIT) PO TABS
400.0000 [IU] | ORAL_TABLET | Freq: Every day | ORAL | Status: DC
  Administered 2011-07-17 – 2011-07-19 (×3): 400 [IU] via ORAL
  Filled 2011-07-16 (×6): qty 1

## 2011-07-16 MED ORDER — HYDROCODONE-ACETAMINOPHEN 5-325 MG PO TABS
1.0000 | ORAL_TABLET | Freq: Four times a day (QID) | ORAL | Status: DC | PRN
  Administered 2011-07-17: 1 via ORAL
  Filled 2011-07-16: qty 1

## 2011-07-16 MED ORDER — ENOXAPARIN SODIUM 40 MG/0.4ML ~~LOC~~ SOLN
40.0000 mg | SUBCUTANEOUS | Status: DC
  Administered 2011-07-16 – 2011-07-18 (×3): 40 mg via SUBCUTANEOUS
  Filled 2011-07-16 (×3): qty 0.4

## 2011-07-16 MED ORDER — IBUPROFEN 400 MG PO TABS
200.0000 mg | ORAL_TABLET | Freq: Four times a day (QID) | ORAL | Status: DC | PRN
  Administered 2011-07-18: 200 mg via ORAL
  Filled 2011-07-16: qty 1

## 2011-07-16 MED ORDER — SODIUM CHLORIDE 0.9 % IJ SOLN
3.0000 mL | Freq: Two times a day (BID) | INTRAMUSCULAR | Status: DC
  Administered 2011-07-16 – 2011-07-18 (×5): 3 mL via INTRAVENOUS
  Filled 2011-07-16 (×5): qty 3

## 2011-07-16 NOTE — ED Notes (Signed)
Code Stroke was called per Dr. Fredricka Bonine. Pt chart faxed to [facility] neurology. Family in room at bsd. Pt a/o x3. Still unable to lift rt leg off of bed. Pt able to move rt foot.

## 2011-07-16 NOTE — ED Notes (Signed)
Pt was at [facility] having a pedicure per her family. Family/friend state pt color began pale and unable speak and clammy. Pt alert and speaking upon arrival. Pt unable to raise rt leg at all. Pt states that is not normal for her that her left leg is normally her bad leg. Family states they checked her blood sugar before EMS arrived and it was 91. Pt states she ate breakfast before leaving house this am.

## 2011-07-16 NOTE — ED Notes (Signed)
Pt able to bend right knee and lift rt leg off of bed at this time.

## 2011-07-16 NOTE — ED Notes (Signed)
Teleneurology completed. MR called and made aware pt is ready for transport

## 2011-07-16 NOTE — ED Notes (Signed)
Neurology paged [facility] for Dr. Fredricka Bonine

## 2011-07-16 NOTE — ED Provider Notes (Signed)
History    Scribed for Felisa Bonier, MD, the patient was seen in room APA12/APA12 . This chart was scribed by Desma Paganini. This patient's care was started at 12:30 PM .   CSN: [id number] Arrival date & time: 07/16/2011 11:36 AM  Chief Complaint  Patient presents with  . Dizziness  . Weakness   The history is provided by the patient.   [name] is a 70 y.o. female who presents to the Emergency Department complaining of weakness and dizziness that began around 10:30am this morning. The patient has a history of pain in legs but had an abrupt onset of weakness in RLE. The patient felt nauseated, diaphoretic and like she "was going to pass out" during the onset of the symptoms. Witnesses say at the time her speech was sluggish as well. Currently her speech is fine. Patient denies weakness in arms. She is currently non-compliant with diabetic and hypertension medications.    PAST MEDICAL HISTORY:  Past Medical History  Diagnosis Date  . Diabetes mellitus   . Asthma   . Chronic knee pain   . Back pain, chronic      PAST SURGICAL HISTORY:  Past Surgical History  Procedure Date  . Appendectomy   . Ovary surgery      MEDICATIONS:  Previous Medications   AMLODIPINE (NORVASC) 10 MG TABLET    Take 10 mg by mouth daily.     ASPIRIN 81 MG CHEWABLE TABLET    Chew 81 mg by mouth daily.     CYCLOBENZAPRINE (FLEXERIL) 10 MG TABLET    Take 10 mg by mouth 2 (two) times daily.     HYDROCODONE-ACETAMINOPHEN (VICODIN) 5-500 MG PER TABLET    Take 1 tablet by mouth 2 (two) times daily.     IBUPROFEN (ADVIL,MOTRIN) 200 MG TABLET    Take 200 mg by mouth every 6 (six) hours as needed.     OLMESARTAN (BENICAR) 40 MG TABLET    Take 40 mg by mouth daily.     VITAMIN D, ERGOCALCIFEROL, PO    Take 1 tablet by mouth daily.       ALLERGIES:   Allergies as of 07/16/2011  . (No Known Allergies)     FAMILY HISTORY:  History reviewed. No pertinent family history.   SOCIAL HISTORY:     History   Social History  . Marital Status: Widowed    Spouse Name: N/A    Number of Children: N/A  . Years of Education: N/A   Social History Main Topics  . Smoking status: Current Everyday Smoker -- 0.2 packs/day  . Smokeless tobacco: None  . Alcohol Use: No  . Drug Use: No  . Sexually Active:    Other Topics Concern  . None   Social History Narrative  . None     Review of Systems  Constitutional: Positive for diaphoresis.  Respiratory: Negative for shortness of breath.   Cardiovascular: Negative for chest pain.  Gastrointestinal: Positive for nausea. Negative for vomiting.  Neurological: Positive for speech difficulty, weakness and light-headedness. Negative for dizziness, seizures, syncope and facial asymmetry.  Psychiatric/Behavioral: Negative for behavioral problems and confusion.   10 Systems reviewed and are negative for acute change except as noted in the HPI.  Physical Exam  BP 121/64  Pulse 70  Temp(Src) 98.5 F (36.9 C) (Oral)  Resp 18  Ht 5\' 1"  (1.549 m)  Wt 305 lb (138.347 kg)  BMI 57.63 kg/m2  SpO2 97%  LMP 07/09/2011  Physical  Exam Constitutional: No apparent distress, well-developed, obese Eyes: PERRL, EOMI; HENT:  Mouth: no facial drooping Lungs: clear bilateral, effort normal Extremities: normal except as described in neurological system below Heart: heart sounds normal, normal rate and rhythm Abdomen: soft; non-tender; normal bowel sounds; Neurological: Alert, oriented, not confused; speech clear; Grip strength equal; pronator drift: unable to hold RLE up (falls before 5 sec); LLE normal; sensation intact in both lower extremities. No pronator drift in RUE;  NIH stroke scale= 2 for RLE weakness only Psychological: normal affect, normal mood   ED Course  Procedures  OTHER DATA REVIEWED: Nursing notes and vital signs reviewed.    DIAGNOSTIC STUDIES: Oxygen Saturation is 99% on room air, normal by my interpretation.     Date:  07/16/2011  Rate: 81  Rhythm: normal sinus rhythm  QRS Axis: normal  Intervals: PR prolonged  ST/T Wave abnormalities: normal  Conduction Disutrbances:first-degree A-V block   Narrative Interpretation: No STEMI  Old EKG Reviewed: unchanged   LABS / RADIOLOGY:  Results for orders placed during the hospital encounter of 07/16/11  CBC      Component Value Range   WBC 3.9 (*) 4.0 - 10.5 (K/uL)   RBC 3.16 (*) 3.87 - 5.11 (MIL/uL)   Hemoglobin 10.1 (*) 12.0 - 15.0 (g/dL)   HCT 16.1 (*) 09.6 - 46.0 (%)   MCV 98.4  78.0 - 100.0 (fL)   MCH 32.0  26.0 - 34.0 (pg)   MCHC 32.5  30.0 - 36.0 (g/dL)   RDW 04.5  40.9 - 81.1 (%)   Platelets 213  150 - 400 (K/uL)  DIFFERENTIAL      Component Value Range   Neutrophils Relative 59  43 - 77 (%)   Neutro Abs 2.3  1.7 - 7.7 (K/uL)   Lymphocytes Relative 32  12 - 46 (%)   Lymphs Abs 1.3  0.7 - 4.0 (K/uL)   Monocytes Relative 7  3 - 12 (%)   Monocytes Absolute 0.3  0.1 - 1.0 (K/uL)   Eosinophils Relative 2  0 - 5 (%)   Eosinophils Absolute 0.1  0.0 - 0.7 (K/uL)   Basophils Relative 0  0 - 1 (%)   Basophils Absolute 0.0  0.0 - 0.1 (K/uL)  BASIC METABOLIC PANEL      Component Value Range   Sodium 138  135 - 145 (mEq/L)   Potassium 4.2  3.5 - 5.1 (mEq/L)   Chloride 103  96 - 112 (mEq/L)   CO2 25  19 - 32 (mEq/L)   Glucose, Bld 138 (*) 70 - 99 (mg/dL)   BUN 19  6 - 23 (mg/dL)   Creatinine, Ser 9.14  0.50 - 1.10 (mg/dL)   Calcium 8.7  8.4 - 78.2 (mg/dL)   GFR calc non Af Amer 53 (*) >60 (mL/min)   GFR calc Af Amer >60  >60 (mL/min)   CT HEAD WITHOUT CONTRAST  Technique: Contiguous axial images were obtained from the base of the skull through the vertex without contrast.  Comparison: None.  Findings: No intracranial hemorrhage.  Prominent white matter type changes most notable parietal lobes greater on the left. No CT evidence of large acute infarct. Small acute infarct cannot be excluded by CT.  Artifact extends through the M1 segment  of the left middle cerebral artery slightly limiting evaluation.  No intracranial mass lesion detected on this unenhanced exam.  No hydrocephalus.  Mild mucosal thickening ethmoid sinus air cells and partial opacification inferior aspect right mastoid air cells.  IMPRESSION: No intracranial hemorrhage or CT evidence of large acute infarct. The prominent white matter type changes limit exclusion of a small acute infarct.  Results discussed with Dr. Doylene Canard 07/16/2011 12:48 p.m.  Results discussed with Dr. Welton Flakes to 07/16/2011 12:38 p.m.  Original Report Authenticated By: Fuller Canada, M.D.    PROCEDURES: Critical Care time 45 minutes   ED COURSE / COORDINATION OF CARE: 12:38 called neurologist to discuss CT scan results 12:54 received phone call from neurology regarding "code stroke" 12:55 called "code stroke" for tele-neurology examination  13:32 Neurologist Dr. Dione Housekeeper re-evaluated risk and benefits of TPA; they both decided not to give TPA due mild deficit and the risks being higher than benefits.  15:06. Recheck physical exam: patient now moving right lower extremity with full strength.  MDM: Differential Diagnosis: CVA, TIA, seizure   IMPRESSION: TIA  PLAN:  Admitted to Floor the case was discussed with the admitting physician Dr. Felecia Shelling   MEDICATIONS GIVEN IN THE E.D.  Medications  cyclobenzaprine (FLEXERIL) 10 MG tablet (not administered)  HYDROcodone-acetaminophen (VICODIN) 5-500 MG per tablet (not administered)  aspirin 81 MG chewable tablet (not administered)  ibuprofen (ADVIL,MOTRIN) 200 MG tablet (not administered)  VITAMIN D, ERGOCALCIFEROL, PO (not administered)  olmesartan (BENICAR) 40 MG tablet (not administered)  amLODipine (NORVASC) 10 MG tablet (not administered)     DISCHARGE MEDICATIONS: New Prescriptions   No medications on file     Scribe Attestation I personally performed the services described in this documentation, which was  scribed in my presence. The recorded information has been reviewed and considered.   Felisa Bonier, MD 07/16/11 1540

## 2011-07-16 NOTE — ED Notes (Signed)
Dr. Felecia Shelling paged for Dr. Fredricka Bonine.

## 2011-07-16 NOTE — ED Notes (Signed)
Pt states she became weak and dizzy around 1030 this am. Pt states she felt like her sugar dropped. Pt states she was getting a pedicure and her feet were hurting so that she felt sick. pts family reported to ems cbg of 51 ems checked cbg of 150. Pt alert and oriented answering all questions and conversing normal. nad noted.

## 2011-07-17 ENCOUNTER — Inpatient Hospital Stay (HOSPITAL_COMMUNITY): Payer: Medicare Other

## 2011-07-17 LAB — GLUCOSE, CAPILLARY
Glucose-Capillary: 107 mg/dL — ABNORMAL HIGH (ref 70–99)
Glucose-Capillary: 97 mg/dL (ref 70–99)
Glucose-Capillary: 126 mg/dL — ABNORMAL HIGH (ref 70–99)

## 2011-07-17 NOTE — H&P (Signed)
NAMEKERRILYN, Simmons              ACCOUNT NO.:  [id number]  MEDICAL RECORD NO.:  [id number]  LOCATION:  A316                          FACILITY:  [facility]  PHYSICIAN:  Agapito Hanway D. Felecia Shelling, MD   DATE OF BIRTH:  11-15-1941  DATE OF ADMISSION:  07/16/2011 DATE OF DISCHARGE:  LH                             HISTORY & PHYSICAL   CHIEF COMPLAINT:  Weakness and dizziness.  The patient developed suddenly weakness in her right lower extremity.  She had numb and also dizzy.  The patient felt like she was passing out, but never lost her conscious.  The patient was then brought to emergency room and she was evaluated.  While she was being evaluated in the emergency room, her symptoms started improving.  Her weakness improved.  Her CT scan and MRI of the brain was found to be negative for any acute lesion.  The patient was then admitted as possible case of transient ischemic attack for further evaluation.  REVIEW OF SYSTEMS:  The patient had no headache, fever, chills, cough, shortness of breath, chest pain, nausea, vomiting, abdominal pain, dysuria, urgency, or frequency of urination.  PAST MEDICAL HISTORY: 1. Diabetes mellitus. 2. Bronchial asthma. 3. Chronic back pain. 4. Hypertension. 5. Hyperlipidemia. 6. Osteoarthritis.  MEDICATIONS: 1. Amlodipine 10 mg p.o. daily. 2. Aspirin 81 mg daily. 3. Vitamin D4 100 mg daily. 4. Flexeril 10 mg b.i.d. 5. Benicar 40 mg daily. 6. Lortab 5/500 one tablet p.o. q.6 h. p.r.n. 7. Ibuprofen 400 mg q.6 hours p.r.n.  SOCIAL HISTORY:  The patient has a history of tobacco smoking.  No history of alcohol or substance abuse.  She has stopped tobacco smoking about 5 years back.  FAMILY HISTORY:  No significant illness in the family.  PHYSICAL EXAMINATION:  GENERAL:  The patient is alert, awake, and pleasant looking. VITAL SIGNS:  Blood pressure 121/64, pulse 70, respiratory rate 16, temperature 97.6 degrees Fahrenheit. HEENT:  Pupils are equal and  reactive. NECK:  Supple. CHEST:  Clear lung fields.  Good air entry. CARDIOVASCULAR SYSTEM:  First and second heart sounds heard.  No murmur. No gallop. ABDOMEN:  Soft and lax.  Bowel sounds positive.  No masses or organomegaly. EXTREMITIES:  No leg edema. NEUROLOGY:  The patient is alert, awake, and oriented x3.  There is no gross neurological deficits.  LABS ON ADMISSION:  Sodium 138, potassium 4.2, chloride 103, carbon dioxide 25, glucose 138, BUN 19, creatinine 1.03, calcium 8.7,  PT 12.7, INR 0.93, troponin less than 0.30.  CBC:  WBC 3.9, hemoglobin 10.1, hematocrit 31.1, and platelet 203.  ASSESSMENT: 1. Probably transient ischemic attack. 2. Diabetes mellitus type 2. 3. Hypertension. 4. Osteoarthritis. 5. Anemia. 6. Chronic back pain.  PLAN:  We will continue the patient on telemetry.  We will do neuro check q.4 h.  We will do carotid Doppler.  We will do Neurology consult. We will continue the patient on aspirin and continue her regular medications.     [name] D. Felecia Shelling, MD     TDF/MEDQ  D:  07/17/2011  T:  07/17/2011  Job:  [id number]

## 2011-07-17 NOTE — Progress Notes (Signed)
[name], [name]              ACCOUNT NO.:  [id number]  MEDICAL RECORD NO.:  [id number]  LOCATION:  A316                          FACILITY:  [facility]  PHYSICIAN:  Miyako Oelke D. Felecia Shelling, MD   DATE OF BIRTH:  17-Feb-1941  DATE OF PROCEDURE:  07/17/2011 DATE OF DISCHARGE:                                PROGRESS NOTE   SUBJECTIVE:  The patient feels better.  She has no dizziness or weakness.  No chest pain or shortness of breath.  OBJECTIVE:  GENERAL:  The patient is alert, awake, and resting. VITAL SIGNS:  Blood pressure 121/61, pulse 70, respiratory rate 16, and temperature 97.6 degrees Fahrenheit. CHEST:  Clear lung fields.  Good air entry. CARDIOVASCULAR:  First and second heart sounds heard.  No murmur.  No gallop. ABDOMEN:  Soft and lax.  Bowel sound is positive.  No mass or organomegaly. EXTREMITIES:  No leg edema. NEUROLOGY:  The patient is alert, awake, and oriented to 3.  There are no neurological deficits.  ASSESSMENT: 1. Probably transient ischemic attacks. 2. Diabetes mellitus. 3. Hypertension. 4. Osteoarthritis. 5. Anemia.  PLAN:  We will continue the patient on neuro check.  Neurology consult is pending.  We will also do carotid Doppler and continue aspirin.     Meera Vasco D. Felecia Shelling, MD     TDF/MEDQ  D:  07/17/2011  T:  07/17/2011  Job:  [id number]

## 2011-07-18 LAB — HEMOGLOBIN A1C
Hgb A1c MFr Bld: 6.6 % — ABNORMAL HIGH (ref <5.7)
Mean Plasma Glucose: 143 mg/dL — ABNORMAL HIGH (ref <117)

## 2011-07-18 LAB — GLUCOSE, CAPILLARY
Glucose-Capillary: 100 mg/dL — ABNORMAL HIGH (ref 70–99)
Glucose-Capillary: 106 mg/dL — ABNORMAL HIGH (ref 70–99)
Glucose-Capillary: 110 mg/dL — ABNORMAL HIGH (ref 70–99)
Glucose-Capillary: 107 mg/dL — ABNORMAL HIGH (ref 70–99)

## 2011-07-18 NOTE — Progress Notes (Signed)
NAMEGRACE, VALLEY              ACCOUNT NO.:  [id number]  MEDICAL RECORD NO.:  [id number]  LOCATION:  A316                          FACILITY:  [facility]  PHYSICIAN:  Brannon Decaire D. Felecia Shelling, MD   DATE OF BIRTH:  22-Dec-1940  DATE OF PROCEDURE:  07/18/2011 DATE OF DISCHARGE:                                PROGRESS NOTE   SUBJECTIVE:  The patient had an episode of weakness and dizziness during the night.  This symptom did not last long.  She is feeling better this morning.  OBJECTIVE:  GENERAL:  The patient is alert, awake, and comfortable. VITAL SIGNS:  Blood pressure 92/54, pulse 77, respiratory rate 18, and temperature 97.6 degrees Fahrenheit. CHEST:  Clear lung fields, good air entry. CARDIOVASCULAR SYSTEM:  First and second heart sound heard.  No murmur. No gallop. ABDOMEN:  Soft and lax bowel sounds positive.  No mass or organomegaly. EXTREMITIES:  No leg edema.  ASSESSMENT: 1. Probably transient ischemic attack. 2. Diabetes mellitus. 3. Hypertension. 4. Morbid obesity. 5. Chronic back pain. 6. Anemia.  PLAN:  We will continue the patient on neuro check.  We will continue current medications.  Neurology consult is still pending.  And I will continue supportive care.     Domitila Stetler D. Felecia Shelling, MD     TDF/MEDQ  D:  07/18/2011  T:  07/18/2011  Job:  [id number]

## 2011-07-19 LAB — GLUCOSE, CAPILLARY: Glucose-Capillary: 105 mg/dL — ABNORMAL HIGH (ref 70–99)

## 2011-07-19 MED ORDER — ASPIRIN 81 MG PO CHEW: 81.0000 mg | CHEWABLE_TABLET | Freq: Every day | ORAL | Status: DC

## 2011-07-19 NOTE — Progress Notes (Signed)
Pt discharged home via family; Pt and family given and explained all discharge instructions, carenotes, and prescriptions; pt and family stated understanding and denied questions/concerns; all f/u appointments in place; IV removed without complicaitons; pt stable at time of discharge  

## 2011-07-19 NOTE — Discharge Summary (Signed)
[name], [name]              ACCOUNT NO.:  [id number]  MEDICAL RECORD NO.:  [id number]  LOCATION:  A316                          FACILITY:  [facility]  PHYSICIAN:  Aroura Vasudevan D. Tiffany Shelling, MD   DATE OF BIRTH:  02/21/1941  DATE OF ADMISSION:  07/16/2011 DATE OF DISCHARGE:  08/13/2012LH                              DISCHARGE SUMMARY   DISCHARGE DIAGNOSES: 1. Probably transient ischemic attack. 2. Hypertension. 3. Diabetes mellitus. 4. Morbid obesity. 5. Osteoarthritis. 6. Anemia. 7. Chronic back pain.  DISCHARGE MEDICATIONS: 1. Lortab 5/500 one tablet p.o. q.6 h. p.r.n. 2. Ibuprofen 400 mg p.o. q.4 h. p.r.n. 3. Norvasc 10 mg p.o. daily. 4. Aspirin 81 mg daily. 5. Calciferol 400 mg p.o. daily. 6. Flexeril 10 mg p.o. t.i.d. 7. Benicar 40 mg p.o. daily.  DISCHARGE INSTRUCTIONS:  The patient will be followed in the office in 1 week duration.  The patient is advised to continue to take current medications.  LABS ON DISCHARGE:  Hemoglobin A1c 6.6.  Average blood glucose 143. CBC:  WBC 3.9, hemoglobin 10.1, hematocrit 31.1, and platelets 213. Sodium 138, potassium 4.2, chloride 103, carbon dioxide 25, glucose 138, and BUN 19, creatinine 1.3, calcium 8.7.  HOSPITAL COURSE:  This is a 70 year old female patient with history of multiple medical illnesses, who was brought to emergency room due to dizziness and weakness.  The patient had a right lower extremity weakness.  She had also numbness in her right side.  The patient was evaluated in the emergency room and she had MRI of brain.  During her evaluation, her symptoms started to resolve.  Her MRI showed no new acute changes.  She was admitted and monitored under telemetry.  Neuro check was done every 4 hours.  Carotid Doppler showed bilateral carotid bifurcation and proximal ICA plaque resulting in less than 50% diameter stenosis.  The patient is currently back to her baseline.  Her symptoms has resolved.  She will be discharged to  home and followed in the office in 1 week duration.     [name] D. Tiffany Shelling, MD     TDF/MEDQ  D:  07/19/2011  T:  07/19/2011  Job:  [id number]

## 2011-07-19 NOTE — Progress Notes (Signed)
RETRO UR Chart Review Completed 

## 2011-08-06 NOTE — Progress Notes (Signed)
Encounter addended by: Clarene Critchley on: 08/06/2011 11:22 AM<BR>     Documentation filed: Flowsheet VN

## 2011-12-15 ENCOUNTER — Emergency Department (HOSPITAL_COMMUNITY)
Admission: EM | Admit: 2011-12-15 | Discharge: 2011-12-16 | Disposition: A | Discharge status: Home / Self Care | Payer: Medicare Other | Attending: Emergency Medicine | Admitting: Emergency Medicine

## 2011-12-15 ENCOUNTER — Emergency Department (HOSPITAL_COMMUNITY): Payer: Medicare Other

## 2011-12-15 ENCOUNTER — Encounter (HOSPITAL_COMMUNITY): Payer: Self-pay | Admitting: *Deleted

## 2011-12-15 DIAGNOSIS — M25569 Pain in unspecified knee: Secondary | ICD-10-CM | POA: Insufficient documentation

## 2011-12-15 DIAGNOSIS — E119 Type 2 diabetes mellitus without complications: Secondary | ICD-10-CM | POA: Insufficient documentation

## 2011-12-15 DIAGNOSIS — J45909 Unspecified asthma, uncomplicated: Secondary | ICD-10-CM | POA: Insufficient documentation

## 2011-12-15 DIAGNOSIS — F17200 Nicotine dependence, unspecified, uncomplicated: Secondary | ICD-10-CM | POA: Insufficient documentation

## 2011-12-15 DIAGNOSIS — J40 Bronchitis, not specified as acute or chronic: Secondary | ICD-10-CM | POA: Insufficient documentation

## 2011-12-15 DIAGNOSIS — G8929 Other chronic pain: Secondary | ICD-10-CM | POA: Insufficient documentation

## 2011-12-15 DIAGNOSIS — R0602 Shortness of breath: Secondary | ICD-10-CM | POA: Insufficient documentation

## 2011-12-15 DIAGNOSIS — N898 Other specified noninflammatory disorders of vagina: Secondary | ICD-10-CM | POA: Insufficient documentation

## 2011-12-15 DIAGNOSIS — Z7982 Long term (current) use of aspirin: Secondary | ICD-10-CM | POA: Insufficient documentation

## 2011-12-15 DIAGNOSIS — D126 Benign neoplasm of colon, unspecified: Secondary | ICD-10-CM | POA: Insufficient documentation

## 2011-12-15 DIAGNOSIS — M549 Dorsalgia, unspecified: Secondary | ICD-10-CM | POA: Insufficient documentation

## 2011-12-15 DIAGNOSIS — D649 Anemia, unspecified: Secondary | ICD-10-CM

## 2011-12-15 MED ORDER — ALBUTEROL SULFATE (5 MG/ML) 0.5% IN NEBU
5.0000 mg | INHALATION_SOLUTION | Freq: Once | RESPIRATORY_TRACT | Status: AC
  Administered 2011-12-15: 5 mg via RESPIRATORY_TRACT
  Filled 2011-12-15: qty 1

## 2011-12-15 MED ORDER — IPRATROPIUM BROMIDE 0.02 % IN SOLN
0.5000 mg | Freq: Once | RESPIRATORY_TRACT | Status: AC
  Administered 2011-12-15: 0.5 mg via RESPIRATORY_TRACT
  Filled 2011-12-15: qty 2.5

## 2011-12-15 NOTE — ED Notes (Signed)
Pt reports being sick on & off since November. Pt became more sob & came to er tonight. States feeling much better after breathing tx.

## 2011-12-15 NOTE — ED Notes (Signed)
C/o shortness of breath and congestion onset 11-15-12

## 2011-12-15 NOTE — ED Notes (Signed)
Pt reports she has had increased sob and coughing for the past week, reports she has been coughing so hard she feels like she is going to pass out, no resp distress noted upon initial exam, 02 sat 98 % on room air

## 2011-12-15 NOTE — ED Notes (Signed)
Fever for 2 days intermittent, nauseated

## 2011-12-16 LAB — BASIC METABOLIC PANEL
BUN: 11 mg/dL (ref 6–23)
CO2: 30 mEq/L (ref 19–32)
Calcium: 9.6 mg/dL (ref 8.4–10.5)
Chloride: 105 mEq/L (ref 96–112)
Creatinine, Ser: 0.98 mg/dL (ref 0.50–1.10)
GFR calc Af Amer: 67 mL/min — ABNORMAL LOW (ref >90)
GFR calc non Af Amer: 58 mL/min — ABNORMAL LOW (ref >90)
Glucose, Bld: 119 mg/dL — ABNORMAL HIGH (ref 70–99)
Potassium: 4.1 mEq/L (ref 3.5–5.1)
Sodium: 140 mEq/L (ref 135–145)

## 2011-12-16 LAB — CBC
HCT: 26.5 % — ABNORMAL LOW (ref 36.0–46.0)
Hemoglobin: 8 g/dL — ABNORMAL LOW (ref 12.0–15.0)
MCH: 28.5 pg (ref 26.0–34.0)
MCHC: 30.2 g/dL (ref 30.0–36.0)
MCV: 94.3 fL (ref 78.0–100.0)
Platelets: 233 10*3/uL (ref 150–400)
RBC: 2.81 MIL/uL — ABNORMAL LOW (ref 3.87–5.11)
RDW: 16 % — ABNORMAL HIGH (ref 11.5–15.5)
WBC: 5.1 10*3/uL (ref 4.0–10.5)

## 2011-12-16 LAB — TROPONIN I: Troponin I: <0.3 ng/mL (ref <0.30)

## 2011-12-16 MED ORDER — PREDNISONE 10 MG PO TABS: 60.0000 mg | ORAL_TABLET | Freq: Every day | ORAL | Status: DC

## 2011-12-16 MED ORDER — ALBUTEROL SULFATE HFA 108 (90 BASE) MCG/ACT IN AERS
2.0000 | INHALATION_SPRAY | RESPIRATORY_TRACT | Status: DC
  Filled 2011-12-16: qty 6.7

## 2011-12-16 MED ORDER — PREDNISONE 20 MG PO TABS
60.0000 mg | ORAL_TABLET | Freq: Once | ORAL | Status: AC
  Administered 2011-12-16: 60 mg via ORAL
  Filled 2011-12-16: qty 3

## 2011-12-16 NOTE — ED Provider Notes (Signed)
History     CSN: [id number]  Arrival date & time 12/15/11  2123   First MD Initiated Contact with Patient 12/15/11 2311      Chief Complaint  Patient presents with  . Shortness of Breath    (Consider location/radiation/quality/duration/timing/severity/associated sxs/prior treatment) Patient is a 71 y.o. female presenting with shortness of breath. The history is provided by the patient.  Shortness of Breath  Associated symptoms include shortness of breath.   the patient reports approximately one week of coughing congestion as well as mild shortness of breath.  She's been a long-time smoker and she continues to smoke.  She denies fever or chills.  She denies nausea vomiting or diarrhea.  She denies orthopnea and PND.  She has albuterol inhaler at home but she suspects it is out.  She's try this at home without any improvement in her symptoms.  Denies melena and hematochezia.  She does report continued vaginal bleeding for the past 15 years.  She reports her OB/GYN does know about this and that she's had an ultrasound and possibly a biopsy but she has not seen her in a while regarding her bleeding.  As reported that she does have some vaginal bleeding several days ago but since has stopped  Past Medical History  Diagnosis Date  . Diabetes mellitus   . Asthma   . Chronic knee pain   . Back pain, chronic     Past Surgical History  Procedure Date  . Appendectomy   . Ovary surgery     No family history on file.  History  Substance Use Topics  . Smoking status: Current Everyday Smoker -- 0.2 packs/day  . Smokeless tobacco: Not on file  . Alcohol Use: No    OB History    Grav Para Term Preterm Abortions TAB SAB Ect Mult Living                  Review of Systems  Respiratory: Positive for shortness of breath.   All other systems reviewed and are negative.    Allergies  Review of patient's allergies indicates no known allergies.  Home Medications   Current Outpatient  Rx  Name Route Sig Dispense Refill  . ALBUTEROL SULFATE HFA 108 (90 BASE) MCG/ACT IN AERS Inhalation Inhale 2 puffs into the lungs every 6 (six) hours as needed. For shortness of breath    . ASPIRIN EC 81 MG PO TBEC Oral Take 81 mg by mouth daily.    . CHOLECALCIFEROL 400 UNITS PO TABS Oral Take 800 Units by mouth daily.    Marland Kitchen OLMESARTAN MEDOXOMIL 40 MG PO TABS Oral Take 40 mg by mouth daily.      Marland Kitchen SIMVASTATIN PO Oral Take 1 tablet by mouth at bedtime.    Marland Kitchen PREDNISONE 10 MG PO TABS Oral Take 6 tablets (60 mg total) by mouth daily. 30 tablet 0    BP 134/59  Pulse 88  Temp(Src) 98.5 F (36.9 C) (Oral)  Resp 24  Ht 5' (1.524 m)  SpO2 97%  Physical Exam  Nursing note and vitals reviewed. Constitutional: She is oriented to person, place, and time. She appears well-developed and well-nourished. No distress.  HENT:  Head: Normocephalic and atraumatic.  Eyes: EOM are normal.  Neck: Normal range of motion.  Cardiovascular: Normal rate, regular rhythm and normal heart sounds.   Pulmonary/Chest: Effort normal. No respiratory distress. She has wheezes. She has no rales.  Abdominal: Soft. She exhibits no distension. There is no tenderness.  Musculoskeletal: Normal range of motion.  Neurological: She is alert and oriented to person, place, and time.  Skin: Skin is warm and dry.  Psychiatric: She has a normal mood and affect. Judgment normal.    ED Course  Procedures (including critical care time)  Labs Reviewed  CBC - Abnormal; Notable for the following:    RBC 2.81 (*)    Hemoglobin 8.0 (*)    HCT 26.5 (*)    RDW 16.0 (*)    All other components within normal limits  BASIC METABOLIC PANEL - Abnormal; Notable for the following:    Glucose, Bld 119 (*)    GFR calc non Af Amer 58 (*)    GFR calc Af Amer 67 (*)    All other components within normal limits  TROPONIN I   Dg Chest 2 View  12/16/2011  *RADIOLOGY REPORT*  Clinical Data: Short of breath  CHEST - 2 VIEW  Comparison: Chest  radiograph 12/15/2011  Findings: Stable enlarged heart silhouette.  Lungs are clear.  No evidence effusion, infiltrate, or pulmonary edema.  No pneumothorax. Degenerative osteophytosis of the thoracic spine.  IMPRESSION: Mild cardiomegaly.  No acute findings.  Original Report Authenticated By: Genevive Bi, M.D.   Dg Chest Portable 1 View  12/15/2011  *RADIOLOGY REPORT*  Clinical Data: Cough, short of breath  PORTABLE CHEST - 1 VIEW  Comparison: This chest radiograph 04/12 1011, 82,005  Findings: Stable enlarged heart silhouette.  There are low lung volumes.  Basilar atelectasis is  similar to prior. Prominence of the right costosternal junction is similar to comparison exams.  IMPRESSION: Cardiomegaly and basilar atelectasis.  No clear acute findings.  Original Report Authenticated By: Genevive Bi, M.D.   I personally reviewed the x-ray.  1. Reactive airway disease   2. Bronchitis   3. Anemia       MDM  Her symptoms today are consistent with bronchitis.  She feels much better after albuterol and Atrovent treatment in the emergency department.  I suspect this may represent a COPD exacerbation although she has never been definitively defined as having COPD.  She'll be sent home on scheduled albuterol and prednisone.  Her hemoglobin today is 8.  5 months ago her hemoglobin was 10 and approximately 2 years ago she did require blood transfusion.  She had a colonoscopy that showed some polyps but otherwise no source of her bleeding.  She never had an endoscopy.  It does then she's continued having abnormal vaginal bleeding for some time that her OB/GYN knows about however she was informed that this is very abnormal and she is to follow up closely with her OB/GYN as this may be the cause of her chronic blood loss.  She's not symptomatic at this time and does not need a blood transfusion.        Lyanne Co, MD 12/16/11 [phone]

## 2011-12-16 NOTE — ED Notes (Signed)
Pt alert & oriented x4, stable gait. Pt given discharge instructions, paperwork & prescription(s), pt verbalized understanding. Pt left department w/ no further questions.  

## 2011-12-16 NOTE — ED Notes (Signed)
Pt sit up to take meds. Wheezing noted w. Movement, edp notified.

## 2012-12-07 ENCOUNTER — Telehealth: Payer: Self-pay

## 2012-12-08 NOTE — Telephone Encounter (Signed)
LMOM to call. Pt called back and said she had a colonoscopy about 2 years ago in [place]. She thinks she was supposed to have her next one in five years. She will check and find out exactly when it was done and when the next one was recommended. She said she is not having any problems.

## 2012-12-29 ENCOUNTER — Encounter (HOSPITAL_COMMUNITY): Payer: Self-pay | Admitting: Pharmacy Technician

## 2012-12-29 ENCOUNTER — Encounter (HOSPITAL_COMMUNITY)
Admission: RE | Admit: 2012-12-29 | Discharge: 2012-12-29 | Disposition: A | Discharge status: Home / Self Care | Payer: Medicare Other | Source: Ambulatory Visit | Attending: Ophthalmology | Admitting: Ophthalmology

## 2012-12-29 ENCOUNTER — Other Ambulatory Visit: Payer: Self-pay

## 2012-12-29 LAB — BASIC METABOLIC PANEL
BUN: 26 mg/dL — ABNORMAL HIGH (ref 6–23)
CO2: 29 mEq/L (ref 19–32)
Calcium: 9.1 mg/dL (ref 8.4–10.5)
Chloride: 103 mEq/L (ref 96–112)
Creatinine, Ser: 1.1 mg/dL (ref 0.50–1.10)
GFR calc Af Amer: 58 mL/min — ABNORMAL LOW (ref >90)
GFR calc non Af Amer: 50 mL/min — ABNORMAL LOW (ref >90)
Glucose, Bld: 61 mg/dL — ABNORMAL LOW (ref 70–99)
Potassium: 4.5 mEq/L (ref 3.5–5.1)
Sodium: 139 mEq/L (ref 135–145)

## 2012-12-29 LAB — HEMOGLOBIN AND HEMATOCRIT, BLOOD
HCT: 36.4 % (ref 36.0–46.0)
Hemoglobin: 11.5 g/dL — ABNORMAL LOW (ref 12.0–15.0)

## 2012-12-29 NOTE — Patient Instructions (Addendum)
[name]  12/29/2012   Your procedure is scheduled on:  01/02/2013  Report to [facility] at 6:30 AM.  Call this number if you have problems the morning of surgery: [phone]   Remember:   Do not eat food or drink liquids after midnight.   Take these medicines the morning of surgery with A SIP OF WATER: Norvasc, Tramadol, Albuterol Inhlaer   Do not wear jewelry, make-up or nail polish.  Do not wear lotions, powders, or perfumes. You may wear deodorant.  Do not shave 48 hours prior to surgery. Men may shave face and neck.  Do not bring valuables to the hospital.  Contacts, dentures or bridgework may not be worn into surgery.  Leave suitcase in the car. After surgery it may be brought to your room.  For patients admitted to the hospital, checkout time is 11:00 AM the day of  discharge.   Patients discharged the day of surgery will not be allowed to drive  home.  Name and phone number of your driver: Daughter and grandson  Special Instructions: N/A   Please read over the following fact sheets that you were given: Care and Recovery After Surgery

## 2013-01-15 MED ORDER — FLURBIPROFEN SODIUM 0.03 % OP SOLN
OPHTHALMIC | Status: AC
  Filled 2013-01-15: qty 2.5

## 2013-01-15 MED ORDER — CYCLOPENTOLATE-PHENYLEPHRINE 0.2-1 % OP SOLN
OPHTHALMIC | Status: AC
  Filled 2013-01-15: qty 2

## 2013-01-15 MED ORDER — PHENYLEPHRINE HCL 2.5 % OP SOLN
OPHTHALMIC | Status: AC
  Filled 2013-01-15: qty 2

## 2013-01-15 MED ORDER — TETRACAINE HCL 0.5 % OP SOLN
OPHTHALMIC | Status: AC
  Filled 2013-01-15: qty 2

## 2013-01-16 ENCOUNTER — Ambulatory Visit (HOSPITAL_COMMUNITY)
Admission: RE | Admit: 2013-01-16 | Discharge: 2013-01-16 | Disposition: A | Discharge status: Home / Self Care | Payer: Medicare Other | Source: Ambulatory Visit | Attending: Ophthalmology | Admitting: Ophthalmology

## 2013-01-16 ENCOUNTER — Ambulatory Visit (HOSPITAL_COMMUNITY): Payer: Medicare Other | Admitting: Anesthesiology

## 2013-01-16 ENCOUNTER — Encounter (HOSPITAL_COMMUNITY): Payer: Self-pay | Admitting: Anesthesiology

## 2013-01-16 ENCOUNTER — Encounter (HOSPITAL_COMMUNITY)
Admission: RE | Disposition: A | Discharge status: Home / Self Care | Payer: Self-pay | Source: Ambulatory Visit | Attending: Ophthalmology

## 2013-01-16 ENCOUNTER — Encounter (HOSPITAL_COMMUNITY): Payer: Self-pay | Admitting: *Deleted

## 2013-01-16 DIAGNOSIS — H2510 Age-related nuclear cataract, unspecified eye: Secondary | ICD-10-CM | POA: Insufficient documentation

## 2013-01-16 DIAGNOSIS — Z01812 Encounter for preprocedural laboratory examination: Secondary | ICD-10-CM | POA: Insufficient documentation

## 2013-01-16 DIAGNOSIS — Z79899 Other long term (current) drug therapy: Secondary | ICD-10-CM | POA: Insufficient documentation

## 2013-01-16 DIAGNOSIS — Z01810 Encounter for preprocedural cardiovascular examination: Secondary | ICD-10-CM | POA: Insufficient documentation

## 2013-01-16 DIAGNOSIS — I10 Essential (primary) hypertension: Secondary | ICD-10-CM | POA: Insufficient documentation

## 2013-01-16 HISTORY — PX: CATARACT EXTRACTION W/PHACO: SHX586

## 2013-01-16 LAB — GLUCOSE, CAPILLARY: Glucose-Capillary: 88 mg/dL (ref 70–99)

## 2013-01-16 SURGERY — PHACOEMULSIFICATION, CATARACT, WITH IOL INSERTION
Anesthesia: Monitor Anesthesia Care | Site: Eye | Laterality: Left | Wound class: Clean

## 2013-01-16 MED ORDER — FENTANYL CITRATE 0.05 MG/ML IJ SOLN: 25.0000 ug | INTRAMUSCULAR | Status: DC | PRN

## 2013-01-16 MED ORDER — CARBACHOL 0.01 % IO SOLN
INTRAOCULAR | Status: AC
  Filled 2013-01-16: qty 1.5

## 2013-01-16 MED ORDER — EPINEPHRINE HCL 1 MG/ML IJ SOLN
INTRAOCULAR | Status: DC | PRN
  Administered 2013-01-16: 08:00:00

## 2013-01-16 MED ORDER — BSS IO SOLN
INTRAOCULAR | Status: DC | PRN
  Administered 2013-01-16: 15 mL via INTRAOCULAR

## 2013-01-16 MED ORDER — TETRACAINE 0.5 % OP SOLN OPTIME - NO CHARGE
OPHTHALMIC | Status: DC | PRN
  Administered 2013-01-16: 1 [drp] via OPHTHALMIC

## 2013-01-16 MED ORDER — PROVISC 10 MG/ML IO SOLN
INTRAOCULAR | Status: DC | PRN
  Administered 2013-01-16: 8.5 mg via INTRAOCULAR

## 2013-01-16 MED ORDER — CYCLOPENTOLATE-PHENYLEPHRINE 0.2-1 % OP SOLN
1.0000 [drp] | OPHTHALMIC | Status: AC
  Administered 2013-01-16 (×3): 1 [drp] via OPHTHALMIC

## 2013-01-16 MED ORDER — MIDAZOLAM HCL 2 MG/2ML IJ SOLN
1.0000 mg | INTRAMUSCULAR | Status: DC | PRN
  Administered 2013-01-16: 2 mg via INTRAVENOUS

## 2013-01-16 MED ORDER — TETRACAINE HCL 0.5 % OP SOLN
1.0000 [drp] | OPHTHALMIC | Status: AC
  Administered 2013-01-16 (×3): 1 [drp] via OPHTHALMIC

## 2013-01-16 MED ORDER — LIDOCAINE HCL (PF) 1 % IJ SOLN
INTRAMUSCULAR | Status: AC
  Filled 2013-01-16: qty 2

## 2013-01-16 MED ORDER — MIDAZOLAM HCL 2 MG/2ML IJ SOLN
INTRAMUSCULAR | Status: AC
  Filled 2013-01-16: qty 2

## 2013-01-16 MED ORDER — EPINEPHRINE HCL 1 MG/ML IJ SOLN
INTRAMUSCULAR | Status: AC
  Filled 2013-01-16: qty 1

## 2013-01-16 MED ORDER — PHENYLEPHRINE HCL 2.5 % OP SOLN
1.0000 [drp] | OPHTHALMIC | Status: AC
  Administered 2013-01-16 (×3): 1 [drp] via OPHTHALMIC

## 2013-01-16 MED ORDER — KETOROLAC TROMETHAMINE 0.5 % OP SOLN
1.0000 [drp] | OPHTHALMIC | Status: AC
  Administered 2013-01-16 (×3): 1 [drp] via OPHTHALMIC

## 2013-01-16 MED ORDER — LACTATED RINGERS IV SOLN
INTRAVENOUS | Status: DC
  Administered 2013-01-16: 1000 mL via INTRAVENOUS

## 2013-01-16 MED ORDER — ONDANSETRON HCL 4 MG/2ML IJ SOLN: 4.0000 mg | Freq: Once | INTRAMUSCULAR | Status: DC | PRN

## 2013-01-16 SURGICAL SUPPLY — 23 items
CAPSULAR TENSION RING-AMO (OPHTHALMIC RELATED) IMPLANT
CLOTH BEACON ORANGE TIMEOUT ST (SAFETY) ×2 IMPLANT
EYE SHIELD UNIVERSAL CLEAR (GAUZE/BANDAGES/DRESSINGS) ×2 IMPLANT
GLOVE BIO SURGEON STRL SZ 6.5 (GLOVE) ×2 IMPLANT
GLOVE ECLIPSE 6.5 STRL STRAW (GLOVE) IMPLANT
GLOVE ECLIPSE 7.0 STRL STRAW (GLOVE) IMPLANT
GLOVE EXAM NITRILE LRG STRL (GLOVE) IMPLANT
GLOVE EXAM NITRILE MD LF STRL (GLOVE) ×2 IMPLANT
GLOVE SKINSENSE NS SZ6.5 (GLOVE)
GLOVE SKINSENSE STRL SZ6.5 (GLOVE) IMPLANT
HEALON 5 0.6 ML (INTRAOCULAR LENS) IMPLANT
KIT VITRECTOMY (OPHTHALMIC RELATED) IMPLANT
PAD ARMBOARD 7.5X6 YLW CONV (MISCELLANEOUS) ×2 IMPLANT
PROC W NO LENS (INTRAOCULAR LENS)
PROC W SPEC LENS (INTRAOCULAR LENS)
PROCESS W NO LENS (INTRAOCULAR LENS) IMPLANT
PROCESS W SPEC LENS (INTRAOCULAR LENS) IMPLANT
RING MALYGIN (MISCELLANEOUS) IMPLANT
SIGHTPATH CAT PROC W REG LENS (Ophthalmic Related) ×2 IMPLANT
TAPE SURG TRANSPORE 1 IN (GAUZE/BANDAGES/DRESSINGS) ×1 IMPLANT
TAPE SURGICAL TRANSPORE 1 IN (GAUZE/BANDAGES/DRESSINGS) ×1
VISCOELASTIC ADDITIONAL (OPHTHALMIC RELATED) IMPLANT
WATER STERILE IRR 250ML POUR (IV SOLUTION) ×2 IMPLANT

## 2013-01-16 NOTE — Transfer of Care (Signed)
Immediate Anesthesia Transfer of Care Note  Patient: [name]  Procedure(s) Performed: Procedure(s) (LRB): CATARACT EXTRACTION PHACO AND INTRAOCULAR LENS PLACEMENT (IOC) (Left)  Patient Location: Shortstay  Anesthesia Type: MAC  Level of Consciousness: awake  Airway & Oxygen Therapy: Patient Spontanous Breathing   Post-op Assessment: Report given to PACU RN, Post -op Vital signs reviewed and stable and Patient moving all extremities  Post vital signs: Reviewed and stable  Complications: No apparent anesthesia complications

## 2013-01-16 NOTE — Anesthesia Preprocedure Evaluation (Addendum)
Anesthesia Evaluation  Patient identified by MRN, date of birth, ID band Patient awake    Reviewed: Allergy & Precautions, H&P , NPO status , Patient's Chart, lab work & pertinent test results  Airway Mallampati: IV TM Distance: >3 FB     Dental  (+) Edentulous Upper   Pulmonary asthma , Current Smoker,    + decreased breath sounds      Cardiovascular hypertension, Pt. on medications Rhythm:Regular Rate:Normal     Neuro/Psych    GI/Hepatic   Endo/Other  diabetes, Type 2, Oral Hypoglycemic AgentsMorbid obesity  Renal/GU      Musculoskeletal   Abdominal   Peds  Hematology   Anesthesia Other Findings   Reproductive/Obstetrics                           Anesthesia Physical Anesthesia Plan  ASA: III  Anesthesia Plan: MAC   Post-op Pain Management:    Induction: Intravenous  Airway Management Planned: Nasal Cannula  Additional Equipment:   Intra-op Plan:   Post-operative Plan:   Informed Consent: I have reviewed the patients History and Physical, chart, labs and discussed the procedure including the risks, benefits and alternatives for the proposed anesthesia with the patient or authorized representative who has indicated his/her understanding and acceptance.     Plan Discussed with:   Anesthesia Plan Comments:         Anesthesia Quick Evaluation

## 2013-01-16 NOTE — Anesthesia Procedure Notes (Signed)
Procedure Name: MAC Date/Time: 01/16/2013 7:25 AM Performed by: Franco Nones Pre-anesthesia Checklist: Patient identified, Emergency Drugs available, Suction available, Timeout performed and Patient being monitored Patient Re-evaluated:Patient Re-evaluated prior to inductionOxygen Delivery Method: Nasal Cannula

## 2013-01-16 NOTE — Anesthesia Postprocedure Evaluation (Signed)
  Anesthesia Post-op Note  Patient: [name]  Procedure(s) Performed: Procedure(s) (LRB): CATARACT EXTRACTION PHACO AND INTRAOCULAR LENS PLACEMENT (IOC) (Left)  Patient Location:  Short Stay  Anesthesia Type: MAC  Level of Consciousness: awake  Airway and Oxygen Therapy: Patient Spontanous Breathing  Post-op Pain: none  Post-op Assessment: Post-op Vital signs reviewed, Patient's Cardiovascular Status Stable, Respiratory Function Stable, Patent Airway, No signs of Nausea or vomiting and Pain level controlled  Post-op Vital Signs: Reviewed and stable  Complications: No apparent anesthesia complications

## 2013-01-16 NOTE — Op Note (Signed)
Patient brought to the operating room and prepped and draped in the usual manner.  Lid speculum inserted in left eye.  Stab incision made at the twelve o'clock position.  Provisc instilled in the anterior chamber.   A 2.4 mm. Stab incision was made temporally.  An anterior capsulotomy was done with a bent 25 gauge needle.  The nucleus was hydrodissected.  The Phaco tip was inserted in the anterior chamber and the nucleus was emulsified.  CDE was 14.37.  The cortical material was then removed with the I and A tip.  Posterior capsule was the polished.  The anterior chamber was deepened with Provisc.  A 22.0 Diopter Rayner 570C IOL was then inserted in the capsular bag.  Provisc was then removed with the I and A tip.  The wound was then hydrated.  Patient sent to the Recovery Room in good condition with follow up in my office.

## 2013-01-16 NOTE — H&P (Signed)
The patient was re examined and there is no change in the patients condition since the original H and P. 

## 2013-01-18 ENCOUNTER — Encounter (HOSPITAL_COMMUNITY): Payer: Self-pay | Admitting: Ophthalmology

## 2013-01-30 NOTE — Telephone Encounter (Signed)
LM for pt to return call. ( In computer [facility] colon[facility] on [facility]). Ask pt when she calls what was recommendation.

## 2013-02-14 ENCOUNTER — Telehealth: Payer: Self-pay

## 2013-02-14 NOTE — Telephone Encounter (Signed)
REVIEWED. AGREE. 

## 2013-02-14 NOTE — Telephone Encounter (Signed)
Called pt. She said she is sure she was supposed to repeat the colonoscopy in 5 years. ( Would be due in November 2014). She said she would like to have the next one here. She is not having any problems but will call if she has problems before then). I told her we can put her on list to call to schedule for Nov 2014.)

## 2013-02-14 NOTE — Telephone Encounter (Signed)
See phone note of 02/14/2013.  

## 2013-02-14 NOTE — Telephone Encounter (Signed)
Last one was done 10/2008 at [facility].

## 2013-02-15 NOTE — Telephone Encounter (Signed)
Pt is on my call list to do for November. Will let Darl Pikes know to nic.

## 2013-03-09 ENCOUNTER — Encounter (HOSPITAL_COMMUNITY): Payer: Self-pay | Admitting: *Deleted

## 2013-03-09 ENCOUNTER — Inpatient Hospital Stay (HOSPITAL_COMMUNITY)
Admission: EM | Admit: 2013-03-09 | Discharge: 2013-03-13 | DRG: 309 | Disposition: A | Discharge status: Home / Self Care | Payer: Medicare Other | Attending: Internal Medicine | Admitting: Internal Medicine

## 2013-03-09 ENCOUNTER — Emergency Department (HOSPITAL_COMMUNITY): Payer: Medicare Other

## 2013-03-09 DIAGNOSIS — M1710 Unilateral primary osteoarthritis, unspecified knee: Secondary | ICD-10-CM

## 2013-03-09 DIAGNOSIS — A598 Trichomoniasis of other sites: Secondary | ICD-10-CM | POA: Diagnosis present

## 2013-03-09 DIAGNOSIS — N390 Urinary tract infection, site not specified: Secondary | ICD-10-CM | POA: Diagnosis present

## 2013-03-09 DIAGNOSIS — B029 Zoster without complications: Secondary | ICD-10-CM

## 2013-03-09 DIAGNOSIS — G8929 Other chronic pain: Secondary | ICD-10-CM | POA: Diagnosis present

## 2013-03-09 DIAGNOSIS — G4730 Sleep apnea, unspecified: Secondary | ICD-10-CM

## 2013-03-09 DIAGNOSIS — R55 Syncope and collapse: Secondary | ICD-10-CM

## 2013-03-09 DIAGNOSIS — Z833 Family history of diabetes mellitus: Secondary | ICD-10-CM

## 2013-03-09 DIAGNOSIS — I4891 Unspecified atrial fibrillation: Principal | ICD-10-CM

## 2013-03-09 DIAGNOSIS — J45909 Unspecified asthma, uncomplicated: Secondary | ICD-10-CM

## 2013-03-09 DIAGNOSIS — Z6841 Body Mass Index (BMI) 40.0 and over, adult: Secondary | ICD-10-CM

## 2013-03-09 DIAGNOSIS — Z8249 Family history of ischemic heart disease and other diseases of the circulatory system: Secondary | ICD-10-CM

## 2013-03-09 DIAGNOSIS — E785 Hyperlipidemia, unspecified: Secondary | ICD-10-CM

## 2013-03-09 DIAGNOSIS — M67919 Unspecified disorder of synovium and tendon, unspecified shoulder: Secondary | ICD-10-CM

## 2013-03-09 DIAGNOSIS — E119 Type 2 diabetes mellitus without complications: Secondary | ICD-10-CM

## 2013-03-09 DIAGNOSIS — Z79899 Other long term (current) drug therapy: Secondary | ICD-10-CM

## 2013-03-09 DIAGNOSIS — J329 Chronic sinusitis, unspecified: Secondary | ICD-10-CM

## 2013-03-09 DIAGNOSIS — R809 Proteinuria, unspecified: Secondary | ICD-10-CM

## 2013-03-09 DIAGNOSIS — S4360XA Sprain of unspecified sternoclavicular joint, initial encounter: Secondary | ICD-10-CM

## 2013-03-09 DIAGNOSIS — Z7982 Long term (current) use of aspirin: Secondary | ICD-10-CM

## 2013-03-09 DIAGNOSIS — E7800 Pure hypercholesterolemia, unspecified: Secondary | ICD-10-CM

## 2013-03-09 DIAGNOSIS — M25559 Pain in unspecified hip: Secondary | ICD-10-CM | POA: Diagnosis present

## 2013-03-09 DIAGNOSIS — M25569 Pain in unspecified knee: Secondary | ICD-10-CM | POA: Diagnosis present

## 2013-03-09 DIAGNOSIS — I10 Essential (primary) hypertension: Secondary | ICD-10-CM

## 2013-03-09 DIAGNOSIS — D649 Anemia, unspecified: Secondary | ICD-10-CM

## 2013-03-09 DIAGNOSIS — M549 Dorsalgia, unspecified: Secondary | ICD-10-CM | POA: Diagnosis present

## 2013-03-09 DIAGNOSIS — Z8673 Personal history of transient ischemic attack (TIA), and cerebral infarction without residual deficits: Secondary | ICD-10-CM

## 2013-03-09 DIAGNOSIS — IMO0001 Reserved for inherently not codable concepts without codable children: Secondary | ICD-10-CM

## 2013-03-09 DIAGNOSIS — F17200 Nicotine dependence, unspecified, uncomplicated: Secondary | ICD-10-CM | POA: Diagnosis present

## 2013-03-09 DIAGNOSIS — G4733 Obstructive sleep apnea (adult) (pediatric): Secondary | ICD-10-CM | POA: Diagnosis present

## 2013-03-09 HISTORY — DX: Anemia, unspecified: D64.9

## 2013-03-09 HISTORY — DX: Hyperlipidemia, unspecified: E78.5

## 2013-03-09 HISTORY — DX: Essential (primary) hypertension: I10

## 2013-03-09 HISTORY — DX: Dizziness and giddiness: R42

## 2013-03-09 HISTORY — DX: Transient cerebral ischemic attack, unspecified: G45.9

## 2013-03-09 LAB — COMPREHENSIVE METABOLIC PANEL
ALT: 11 U/L (ref 0–35)
AST: 14 U/L (ref 0–37)
Albumin: 3.3 g/dL — ABNORMAL LOW (ref 3.5–5.2)
Alkaline Phosphatase: 83 U/L (ref 39–117)
BUN: 22 mg/dL (ref 6–23)
CO2: 29 mEq/L (ref 19–32)
Calcium: 9.5 mg/dL (ref 8.4–10.5)
Chloride: 102 mEq/L (ref 96–112)
Creatinine, Ser: 0.99 mg/dL (ref 0.50–1.10)
GFR calc Af Amer: 66 mL/min — ABNORMAL LOW (ref >90)
GFR calc non Af Amer: 57 mL/min — ABNORMAL LOW (ref >90)
Glucose, Bld: 76 mg/dL (ref 70–99)
Potassium: 3.5 mEq/L (ref 3.5–5.1)
Sodium: 140 mEq/L (ref 135–145)
Total Bilirubin: 0.2 mg/dL — ABNORMAL LOW (ref 0.3–1.2)
Total Protein: 7.3 g/dL (ref 6.0–8.3)

## 2013-03-09 LAB — CBC WITH DIFFERENTIAL/PLATELET
Basophils Absolute: 0 10*3/uL (ref 0.0–0.1)
Basophils Relative: 0 % (ref 0–1)
Eosinophils Absolute: 0.1 10*3/uL (ref 0.0–0.7)
Eosinophils Relative: 2 % (ref 0–5)
HCT: 38.8 % (ref 36.0–46.0)
Hemoglobin: 12.4 g/dL (ref 12.0–15.0)
Lymphocytes Relative: 53 % — ABNORMAL HIGH (ref 12–46)
Lymphs Abs: 2.4 10*3/uL (ref 0.7–4.0)
MCH: 30.6 pg (ref 26.0–34.0)
MCHC: 32 g/dL (ref 30.0–36.0)
MCV: 95.8 fL (ref 78.0–100.0)
Monocytes Absolute: 0.4 10*3/uL (ref 0.1–1.0)
Monocytes Relative: 9 % (ref 3–12)
Neutro Abs: 1.6 10*3/uL — ABNORMAL LOW (ref 1.7–7.7)
Neutrophils Relative %: 36 % — ABNORMAL LOW (ref 43–77)
Platelets: 229 10*3/uL (ref 150–400)
RBC: 4.05 MIL/uL (ref 3.87–5.11)
RDW: 15.6 % — ABNORMAL HIGH (ref 11.5–15.5)
WBC: 4.5 10*3/uL (ref 4.0–10.5)

## 2013-03-09 LAB — URINE MICROSCOPIC-ADD ON

## 2013-03-09 LAB — URINALYSIS, ROUTINE W REFLEX MICROSCOPIC
Bilirubin Urine: NEGATIVE
Glucose, UA: NEGATIVE mg/dL
Hgb urine dipstick: NEGATIVE
Leukocytes, UA: NEGATIVE
Nitrite: NEGATIVE
Protein, ur: 100 mg/dL — AB
Specific Gravity, Urine: >1.03 — ABNORMAL HIGH (ref 1.005–1.030)
Urobilinogen, UA: 0.2 mg/dL (ref 0.0–1.0)
pH: 5.5 (ref 5.0–8.0)

## 2013-03-09 LAB — PROTIME-INR
INR: 0.9 (ref 0.00–1.49)
Prothrombin Time: 12.1 seconds (ref 11.6–15.2)

## 2013-03-09 LAB — TROPONIN I: Troponin I: <0.3 ng/mL (ref <0.30)

## 2013-03-09 MED ORDER — MECLIZINE HCL 12.5 MG PO TABS
25.0000 mg | ORAL_TABLET | Freq: Once | ORAL | Status: AC
  Administered 2013-03-09: 25 mg via ORAL
  Filled 2013-03-09: qty 2

## 2013-03-09 MED ORDER — COUMADIN BOOK
Freq: Once | Status: AC
  Administered 2013-03-10: 12:00:00
  Filled 2013-03-09: qty 1

## 2013-03-09 MED ORDER — WARFARIN SODIUM 10 MG PO TABS
10.0000 mg | ORAL_TABLET | Freq: Once | ORAL | Status: AC
  Administered 2013-03-09: 10 mg via ORAL
  Filled 2013-03-09 (×2): qty 1

## 2013-03-09 MED ORDER — WARFARIN - PHARMACIST DOSING INPATIENT
Status: DC
  Administered 2013-03-12: 16:00:00
  Administered 2013-03-13: 1

## 2013-03-09 MED ORDER — SODIUM CHLORIDE 0.9 % IV SOLN: INTRAVENOUS | Status: AC

## 2013-03-09 MED ORDER — WARFARIN VIDEO
Freq: Once | Status: AC
  Administered 2013-03-10: 08:00:00

## 2013-03-09 MED ORDER — DILTIAZEM HCL 30 MG PO TABS
30.0000 mg | ORAL_TABLET | Freq: Once | ORAL | Status: AC
  Administered 2013-03-09: 30 mg via ORAL
  Filled 2013-03-09: qty 1

## 2013-03-09 MED ORDER — SODIUM CHLORIDE 0.9 % IV SOLN
INTRAVENOUS | Status: DC
  Administered 2013-03-09: 23:00:00 via INTRAVENOUS
  Administered 2013-03-11: 20 mL/h via INTRAVENOUS

## 2013-03-09 MED ORDER — TRAMADOL HCL 50 MG PO TABS
50.0000 mg | ORAL_TABLET | Freq: Two times a day (BID) | ORAL | Status: DC | PRN
  Administered 2013-03-09 – 2013-03-12 (×5): 50 mg via ORAL
  Filled 2013-03-09 (×6): qty 1

## 2013-03-09 MED ORDER — DILTIAZEM HCL 30 MG PO TABS
30.0000 mg | ORAL_TABLET | Freq: Four times a day (QID) | ORAL | Status: DC
  Administered 2013-03-10 – 2013-03-13 (×16): 30 mg via ORAL
  Filled 2013-03-09 (×16): qty 1

## 2013-03-09 MED ORDER — ENOXAPARIN SODIUM 40 MG/0.4ML ~~LOC~~ SOLN: 40.0000 mg | SUBCUTANEOUS | Status: DC

## 2013-03-09 MED ORDER — ACETAMINOPHEN 500 MG PO TABS
500.0000 mg | ORAL_TABLET | ORAL | Status: DC | PRN
  Administered 2013-03-10: 500 mg via ORAL
  Filled 2013-03-09: qty 1

## 2013-03-09 MED ORDER — ENOXAPARIN SODIUM 150 MG/ML ~~LOC~~ SOLN
1.0000 mg/kg | Freq: Two times a day (BID) | SUBCUTANEOUS | Status: DC
  Administered 2013-03-09: 19:00:00 via SUBCUTANEOUS
  Filled 2013-03-09: qty 1

## 2013-03-09 MED ORDER — ENOXAPARIN SODIUM 80 MG/0.8ML ~~LOC~~ SOLN
70.0000 mg | SUBCUTANEOUS | Status: DC
  Administered 2013-03-10 – 2013-03-12 (×3): 70 mg via SUBCUTANEOUS
  Filled 2013-03-09 (×3): qty 0.8

## 2013-03-09 MED ORDER — LEVALBUTEROL TARTRATE 45 MCG/ACT IN AERO
2.0000 | INHALATION_SPRAY | Freq: Four times a day (QID) | RESPIRATORY_TRACT | Status: DC | PRN
  Administered 2013-03-11: 2 via RESPIRATORY_TRACT
  Filled 2013-03-09: qty 15

## 2013-03-09 NOTE — ED Notes (Signed)
Feels weak and "falling around",  Hx of vertigo, Having vaginal bleeding ,says she is going thru menopause. Legs "giving out" Nausea, no vomiting.  Alert,

## 2013-03-09 NOTE — ED Provider Notes (Signed)
History     CSN: [id number]  Arrival date & time 03/09/13  1310   First MD Initiated Contact with Patient 03/09/13 1448      Chief Complaint  Patient presents with  . Weakness     HPI Pt was seen at 1450.   Per pt, c/o sudden onset and persistence of multiple intermittent episodes of "dizziness" for the past 2 days.  Has been associated with nausea, diaphoresis, generalized weakness, and "my legs giving out."  Describes the dizziness as "everything is moving around."  Pt also states she had an episode of near syncope and fall today "but my son caught me so I didn't pass out all the way."  Denies syncope, no vomiting/diarrhea, no CP/palpitations, no SOB/cough, no focal motor weakness, no tingling/numbness in extremities, no slurred speech, no facial droop, no fevers.    Past Medical History  Diagnosis Date  . Diabetes mellitus   . Asthma   . Chronic knee pain   . Back pain, chronic   . Hypertension   . Vertigo   . Hyperlipidemia   . TIA (transient ischemic attack)   . Anemia     Past Surgical History  Procedure Laterality Date  . Appendectomy    . Ovary surgery    . Cataract extraction w/phaco Left 01/16/2013    Procedure: CATARACT EXTRACTION PHACO AND INTRAOCULAR LENS PLACEMENT (IOC);  Surgeon: Loraine Leriche T. Nile Riggs, MD;  Location: [facility];  Service: Ophthalmology;  Laterality: Left;  CDE:14.37     History  Substance Use Topics  . Smoking status: Current Every Day Smoker -- 0.25 packs/day  . Smokeless tobacco: Not on file  . Alcohol Use: No    Review of Systems ROS: Statement: All systems negative except as marked or noted in the HPI; Constitutional: Negative for fever and chills. ; ; Eyes: Negative for eye pain, redness and discharge. ; ; ENMT: Negative for ear pain, hoarseness, nasal congestion, sinus pressure and sore throat. ; ; Cardiovascular: +diaphoresis. Negative for chest pain, palpitations, dyspnea and peripheral edema. ; ; Respiratory: Negative for cough, wheezing  and stridor. ; ; Gastrointestinal: +nausea. Negative for vomiting, diarrhea, abdominal pain, blood in stool, hematemesis, jaundice and rectal bleeding. . ; ; Genitourinary: Negative for dysuria, flank pain and hematuria. ; ; Musculoskeletal: Negative for back pain and neck pain. Negative for swelling and trauma.; ; Skin: Negative for pruritus, rash, abrasions, blisters, bruising and skin lesion.; ; Neuro: +"dizziness," near syncope. Negative for headache and neck stiffness. Negative for weakness, altered level of consciousness , altered mental status, extremity weakness, paresthesias, involuntary movement, seizure and syncope.      Allergies  Review of patient's allergies indicates no known allergies.  Home Medications   Current Outpatient Rx  Name  Route  Sig  Dispense  Refill  . albuterol (PROVENTIL HFA) 108 (90 BASE) MCG/ACT inhaler   Inhalation   Inhale 2 puffs into the lungs every 6 (six) hours as needed. For shortness of breath         . amLODipine (NORVASC) 10 MG tablet   Oral   Take 10 mg by mouth daily.         Marland Kitchen aspirin EC 81 MG tablet   Oral   Take 81 mg by mouth daily.         . cholecalciferol (VITAMIN D) 1000 UNITS tablet   Oral   Take 1,000 Units by mouth daily.         . simvastatin (ZOCOR) 20 MG tablet  Oral   Take 20 mg by mouth every evening.         . traMADol-acetaminophen (ULTRACET) 37.5-325 MG per tablet   Oral   Take 1 tablet by mouth every 6 (six) hours as needed. Pain           BP 117/84  Pulse 54  Temp(Src) 98.9 F (37.2 C) (Oral)  Resp 20  Ht 5' (1.524 m)  Wt 300 lb (136.079 kg)  BMI 58.59 kg/m2  SpO2 99%  Physical Exam 1455: Physical examination:  Nursing notes reviewed; Vital signs and O2 SAT reviewed;  Constitutional: Well developed, Well nourished, Well hydrated, In no acute distress; Head:  Normocephalic, atraumatic; Eyes: EOMI, PERRL, No scleral icterus; ENMT: Mouth and pharynx normal, Mucous membranes moist; Neck:  Supple, Full range of motion, No lymphadenopathy; Cardiovascular: Irregular irregular rate and rhythm, No gallop; Respiratory: Breath sounds clear & equal bilaterally, No rales, rhonchi, wheezes.  Speaking full sentences with ease, Normal respiratory effort/excursion; Chest: Nontender, Movement normal; Abdomen: Soft, Nontender, Nondistended, Normal bowel sounds; Genitourinary: No CVA tenderness; Extremities: Pulses normal, No tenderness, No edema, No calf edema or asymmetry.; Neuro: AA&Ox3, Major CN grossly intact.  Strength 5/5 equal bilat UE's and LE's.  DTR 2/4 equal bilat UE's and LE's.  No gross sensory deficits.  Normal cerebellar testing bilat UE's (finger-nose) and LE's (heel-shin). Speech clear.  No facial droop.  +left horizontal gaze fatigable nystagmus.;; Skin: Color normal, Warm, Dry.   ED Course  Procedures     MDM  *MDM Reviewed: previous chart, nursing note and vitals Reviewed previous: labs, ECG and MRI Interpretation: labs, ECG, CT scan and x-ray    Date: 03/09/2013  Rate: 100  Rhythm: atrial fibrillation  QRS Axis: normal  Intervals: normal  ST/T Wave abnormalities: normal  Conduction Disutrbances:none  Narrative Interpretation:   Old EKG Reviewed: changes noted; new afib compared to previous EKG dated 12/29/2012.    Date: 03/09/2013  Repeat due to HR on VS decreasing to 50's then increasing to 100's  Rate: 100  Rhythm: atrial fibrillation  QRS Axis: normal  Intervals: normal  ST/T Wave abnormalities: normal  Conduction Disutrbances:none  Narrative Interpretation:   Old EKG Reviewed: unchanged from previous EKG today.  Results for orders placed during the hospital encounter of 03/09/13  CBC WITH DIFFERENTIAL      Result Value Range   WBC 4.5  4.0 - 10.5 K/uL   RBC 4.05  3.87 - 5.11 MIL/uL   Hemoglobin 12.4  12.0 - 15.0 g/dL   HCT 96.0  45.4 - 09.8 %   MCV 95.8  78.0 - 100.0 fL   MCH 30.6  26.0 - 34.0 pg   MCHC 32.0  30.0 - 36.0 g/dL   RDW 11.9 (*) 14.7  - 15.5 %   Platelets 229  150 - 400 K/uL   Neutrophils Relative 36 (*) 43 - 77 %   Neutro Abs 1.6 (*) 1.7 - 7.7 K/uL   Lymphocytes Relative 53 (*) 12 - 46 %   Lymphs Abs 2.4  0.7 - 4.0 K/uL   Monocytes Relative 9  3 - 12 %   Monocytes Absolute 0.4  0.1 - 1.0 K/uL   Eosinophils Relative 2  0 - 5 %   Eosinophils Absolute 0.1  0.0 - 0.7 K/uL   Basophils Relative 0  0 - 1 %   Basophils Absolute 0.0  0.0 - 0.1 K/uL  COMPREHENSIVE METABOLIC PANEL      Result Value Range  Sodium 140  135 - 145 mEq/L   Potassium 3.5  3.5 - 5.1 mEq/L   Chloride 102  96 - 112 mEq/L   CO2 29  19 - 32 mEq/L   Glucose, Bld 76  70 - 99 mg/dL   BUN 22  6 - 23 mg/dL   Creatinine, Ser 1.61  0.50 - 1.10 mg/dL   Calcium 9.5  8.4 - 09.6 mg/dL   Total Protein 7.3  6.0 - 8.3 g/dL   Albumin 3.3 (*) 3.5 - 5.2 g/dL   AST 14  0 - 37 U/L   ALT 11  0 - 35 U/L   Alkaline Phosphatase 83  39 - 117 U/L   Total Bilirubin 0.2 (*) 0.3 - 1.2 mg/dL   GFR calc non Af Amer 57 (*) >90 mL/min   GFR calc Af Amer 66 (*) >90 mL/min  TROPONIN I      Result Value Range   Troponin I <0.30  <0.30 ng/mL  URINALYSIS, ROUTINE W REFLEX MICROSCOPIC      Result Value Range   Color, Urine YELLOW  YELLOW   APPearance CLOUDY (*) CLEAR   Specific Gravity, Urine >1.030 (*) 1.005 - 1.030   pH 5.5  5.0 - 8.0   Glucose, UA NEGATIVE  NEGATIVE mg/dL   Hgb urine dipstick NEGATIVE  NEGATIVE   Bilirubin Urine NEGATIVE  NEGATIVE   Ketones, ur TRACE (*) NEGATIVE mg/dL   Protein, ur 045 (*) NEGATIVE mg/dL   Urobilinogen, UA 0.2  0.0 - 1.0 mg/dL   Nitrite NEGATIVE  NEGATIVE   Leukocytes, UA NEGATIVE  NEGATIVE  URINE MICROSCOPIC-ADD ON      Result Value Range   Squamous Epithelial / LPF FEW (*) RARE   WBC, UA 3-6  <3 WBC/hpf   RBC / HPF 0-2  <3 RBC/hpf   Bacteria, UA FEW (*) RARE   Urine-Other TRICHOMONAS PRESENT     Dg Chest 1 View 03/09/2013  *RADIOLOGY REPORT*  Clinical Data: History of weakness, diabetes, asthma, and hypertension.  CHEST - 1  VIEW  Comparison: 12/15/2011.  Findings: There is moderate enlargement cardiac silhouette accentuated by AP magnification.  No pulmonary edema, pneumonia, or pleural effusion is seen.  No skeletal lesions evident.  There is obesity.  IMPRESSION: Enlargement of the cardiac silhouette with no evidence of pulmonary edema or pleural effusion.  Obesity.   Original Report Authenticated By: Onalee Hua Call    Ct Head Wo Contrast 03/09/2013  *RADIOLOGY REPORT*  Clinical Data: Dizziness.  Weakness in both legs.  CT HEAD WITHOUT CONTRAST  Technique:  Contiguous axial images were obtained from the base of the skull through the vertex without contrast.  Comparison: 07/16/2011  Findings: The brain stem, cerebellum, cerebral peduncles, thalami, basal ganglia, basilar cisterns, and ventricular system appear unremarkable.  Periventricular and corona radiata white matter hypodensities are most compatible with chronic ischemic microvascular white matter disease.  No intracranial hemorrhage, mass lesion, or acute infarction is identified.  Chronic ethmoid sinusitis observed.  Small right mastoid effusion.  IMPRESSION:  1. Periventricular and corona radiata white matter hypodensities are most compatible with chronic ischemic microvascular white matter disease. 2.  Chronic ethmoid sinusitis with small right mastoid effusion.   Original Report Authenticated By: Gaylyn Rong, M.D.      1755:  Pt not orthostatic.  New afib x2 EKG's today; may be contributing to symptoms.  HR ranging 50-115 while in the ED.  BP stable.  Neuro exam continues intact. CHADS score 4.  Dx  and testing d/w pt and family.  Questions answered.  Verb understanding, agreeable to admit.  T/C to Dr. Sudie Bailey, case discussed, including:  HPI, pertinent PM/SHx, VS/PE, dx testing, ED course and treatment:  Agreeable to admit, requests to write temporary orders, obtain tele bed, start cardizem 30mg  PO q6h and SQ lovenox.                 Laray Anger, DO 03/12/13 1416

## 2013-03-09 NOTE — ED Notes (Signed)
Pt gone to X-Ray 

## 2013-03-09 NOTE — ED Notes (Signed)
edp in with pt now 

## 2013-03-09 NOTE — Progress Notes (Addendum)
ANTICOAGULATION CONSULT NOTE  Pharmacy Consult for Lovenox Indication: atrial fibrillation  No Known Allergies  Patient Measurements: Height: 5' (152.4 cm) Weight: 300 lb (136.079 kg) IBW/kg (Calculated) : 45.5  Vital Signs: Temp: 98.9 F (37.2 C) (04/04 1327) Temp src: Oral (04/04 1327) BP: 121/75 mmHg (04/04 1615) Pulse Rate: 94 (04/04 1615)  Labs:  Recent Labs  03/09/13 1425 03/09/13 1442  HGB 12.4  --   HCT 38.8  --   PLT 229  --   CREATININE 0.99  --   TROPONINI  --  <0.30    Estimated Creatinine Clearance: 69.2 ml/min (by C-G formula based on Cr of 0.99).   Medical History: Past Medical History  Diagnosis Date  . Diabetes mellitus   . Asthma   . Chronic knee pain   . Back pain, chronic   . Hypertension   . Vertigo   . Hyperlipidemia   . TIA (transient ischemic attack)   . Anemia    Medications:   (Not in a hospital admission) Home Meds reviewed.  Assessment: Okay for Protocol Baseline coag labs pending, Hx vaginal bleeding per ED notes.  Goal of Therapy:  Systemic Anticoagulation. Monitor PLTC per protocol.   Plan:  Lovenox 1mg /kg SQ every 12 hours. CBC every M-W-F, with first occurrence in AM. F/U baseline labs.  Mady Gemma 03/09/2013,6:29 PM  Addendum:  Add Warfarin protocol, Lovenox changed to prophylaxis dosing (adjusted for obesity).  Warfarin 10mg  PO x 1, daily labs.  Mady Gemma, [facility] 03/09/2013 7:58 PM

## 2013-03-10 LAB — CBC
HCT: 34 % — ABNORMAL LOW (ref 36.0–46.0)
Hemoglobin: 10.9 g/dL — ABNORMAL LOW (ref 12.0–15.0)
MCH: 30.6 pg (ref 26.0–34.0)
MCHC: 32.1 g/dL (ref 30.0–36.0)
MCV: 95.5 fL (ref 78.0–100.0)
Platelets: 192 10*3/uL (ref 150–400)
RBC: 3.56 MIL/uL — ABNORMAL LOW (ref 3.87–5.11)
RDW: 15.5 % (ref 11.5–15.5)
WBC: 3.2 10*3/uL — ABNORMAL LOW (ref 4.0–10.5)

## 2013-03-10 LAB — TSH: TSH: 1.721 u[IU]/mL (ref 0.350–4.500)

## 2013-03-10 LAB — PROTIME-INR
INR: 0.98 (ref 0.00–1.49)
Prothrombin Time: 12.9 seconds (ref 11.6–15.2)

## 2013-03-10 MED ORDER — WARFARIN SODIUM 10 MG PO TABS
10.0000 mg | ORAL_TABLET | Freq: Once | ORAL | Status: AC
  Administered 2013-03-10: 10 mg via ORAL
  Filled 2013-03-10: qty 1

## 2013-03-10 MED ORDER — ONDANSETRON HCL 4 MG/2ML IJ SOLN
4.0000 mg | INTRAMUSCULAR | Status: DC | PRN
  Administered 2013-03-10: 4 mg via INTRAVENOUS
  Filled 2013-03-10: qty 2

## 2013-03-10 NOTE — Progress Notes (Signed)
ANTICOAGULATION CONSULT NOTE  Pharmacy Consult for Lovenox / Warfarin Indication: atrial fibrillation  No Known Allergies  Patient Measurements: Height: 5' (152.4 cm) Weight: 297 lb 13.5 oz (135.1 kg) IBW/kg (Calculated) : 45.5  Vital Signs: Temp: 97.9 F (36.6 C) (04/05 0500) Temp src: Oral (04/05 0500) BP: 128/87 mmHg (04/05 0500) Pulse Rate: 82 (04/05 0500)  Labs:  Recent Labs  03/09/13 1425 03/09/13 1442 03/10/13 0730  HGB 12.4  --  10.9*  HCT 38.8  --  34.0*  PLT 229  --  192  LABPROT 12.1  --  12.9  INR 0.90  --  0.98  CREATININE 0.99  --   --   TROPONINI  --  <0.30  --     Estimated Creatinine Clearance: 68.8 ml/min (by C-G formula based on Cr of 0.99).   Medical History: Past Medical History  Diagnosis Date  . Diabetes mellitus   . Asthma   . Chronic knee pain   . Back pain, chronic   . Hypertension   . Vertigo   . Hyperlipidemia   . TIA (transient ischemic attack)   . Anemia    Medications:  Prescriptions prior to admission  Medication Sig Dispense Refill  . albuterol (PROAIR HFA) 108 (90 BASE) MCG/ACT inhaler Inhale 2 puffs into the lungs every 6 (six) hours as needed for wheezing or shortness of breath.      Marland Kitchen amLODipine (NORVASC) 10 MG tablet Take 10 mg by mouth daily.      Marland Kitchen aspirin EC 81 MG tablet Take 81 mg by mouth every morning.       . cholecalciferol (VITAMIN D) 1000 UNITS tablet Take 1,000 Units by mouth every morning.       . simvastatin (ZOCOR) 20 MG tablet Take 20 mg by mouth every morning.       . traMADol-acetaminophen (ULTRACET) 37.5-325 MG per tablet Take 1 tablet by mouth 2 (two) times daily as needed (for arthritis pain). Pain       Assessment: Okay for Protocol Hx vaginal bleeding per ED notes.  Goal of Therapy:  Systemic Anticoagulation. Monitor PLTC per protocol.   Plan:  Continue Lovenox 70mg  SQ every 24 hours for VTE prophylaxis (adjusted to obesity). Repeat warfarin 10mg  PO x 1.  [name] 03/10/2013,8:31 AM

## 2013-03-10 NOTE — Progress Notes (Signed)
Subjective: Patient was admitted due to new onset atrial fibrillation. She had presyncopal episode. She is feeling better.  Objective: Vital signs in last 24 hours: Temp:  [97.9 F (36.6 C)-98.9 F (37.2 C)] 97.9 F (36.6 C) (04/05 0500) Pulse Rate:  [54-104] 82 (04/05 0500) Resp:  [18-23] 20 (04/05 0500) BP: (97-149)/(43-87) 128/87 mmHg (04/05 0500) SpO2:  [95 %-100 %] 95 % (04/05 0500) Weight:  [135.1 kg (297 lb 13.5 oz)-136.079 kg (300 lb)] 135.1 kg (297 lb 13.5 oz) (04/04 2050) Weight change:  Last BM Date: 03/09/13  Intake/Output from previous day: 04/04 0701 - 04/05 0700 In: 137.7 [I.V.:137.7] Out: 1 [Urine:1]  PHYSICAL EXAM General appearance: alert and no distress Resp: diminished breath sounds bilaterally and rhonchi bilaterally Cardio: regularly irregular rhythm GI: soft, non-tender; bowel sounds normal; no masses,  no organomegaly Extremities: extremities normal, atraumatic, no cyanosis or edema  Lab Results:    @labtest @ [facility] No results found for this basename: PHART, PCO2, PO2ART, TCO2, HCO3,  in the last 72 hours CULTURES No results found for this or any previous visit (from the past 240 hour(s)). Studies/Results: Dg Chest 1 View  03/09/2013  *RADIOLOGY REPORT*  Clinical Data: History of weakness, diabetes, asthma, and hypertension.  CHEST - 1 VIEW  Comparison: 12/15/2011.  Findings: There is moderate enlargement cardiac silhouette accentuated by AP magnification.  No pulmonary edema, pneumonia, or pleural effusion is seen.  No skeletal lesions evident.  There is obesity.  IMPRESSION: Enlargement of the cardiac silhouette with no evidence of pulmonary edema or pleural effusion.  Obesity.   Original Report Authenticated By: Onalee Hua Call    Ct Head Wo Contrast  03/09/2013  *RADIOLOGY REPORT*  Clinical Data: Dizziness.  Weakness in both legs.  CT HEAD WITHOUT CONTRAST  Technique:  Contiguous axial images were obtained from the base of the skull through the vertex  without contrast.  Comparison: 07/16/2011  Findings: The brain stem, cerebellum, cerebral peduncles, thalami, basal ganglia, basilar cisterns, and ventricular system appear unremarkable.  Periventricular and corona radiata white matter hypodensities are most compatible with chronic ischemic microvascular white matter disease.  No intracranial hemorrhage, mass lesion, or acute infarction is identified.  Chronic ethmoid sinusitis observed.  Small right mastoid effusion.  IMPRESSION:  1. Periventricular and corona radiata white matter hypodensities are most compatible with chronic ischemic microvascular white matter disease. 2.  Chronic ethmoid sinusitis with small right mastoid effusion.   Original Report Authenticated By: Gaylyn Rong, M.D.     Medications: I have reviewed the patient's current medications.  Assesment: . New onset atrial fibrillation.  2. Near syncopal episode, probably secondary to atrial fibrillation.  3. Benign essential hypertension.  4. History of diabetes.  5. Hyperlipidemia.  6. Question past transient ischemic attack.  7. Chronic knee pain.  8. Chronic back pain.  9. Trichomonas in the urine.  10.Morbid obesity.  11.Asthma.  Active Problems:   * No active hospital problems. *    Plan: Continue Cardizem and anticoagulation Will do Echo Continue regular treatment    LOS: 1 day   Sekou Zuckerman 03/10/2013, 9:40 AM

## 2013-03-10 NOTE — Progress Notes (Signed)
Called and spoke with Dr. Sudie Bailey in regards to new order for patient to be in ICU, states that can be changed to Med-Surg Telemetry patient since HR in 80's at this time. New order written at this time

## 2013-03-10 NOTE — H&P (Cosign Needed)
NAMEAURIEL, KIST              ACCOUNT NO.:  [id number]  MEDICAL RECORD NO.:  [id number]  LOCATION:  A314                          FACILITY:  [facility]  PHYSICIAN:  Mila Homer. Sudie Bailey, M.D.DATE OF BIRTH:  04/17/1941  DATE OF ADMISSION:  03/09/2013 DATE OF DISCHARGE:  LH                             HISTORY & PHYSICAL   This 72 year old patient of Dr. Felecia Shelling was at home today when she became severely weak and dizzy and almost passed out.  She was brought to the [facility] Emergency Department for evaluation.  PAST MEDICAL PROBLEMS: 1. Diabetes. 2. Asthma. 3. Chronic knee pain. 4. Chronic back pain. 5. Benign essential hypertension. 6. Hyperlipidemia. 7. Vertigo. 8. Anemia. 9. She has had a TIA in the past. 10.Morbid obesity.  SURGERIES: 1. Appendectomy. 2. Surgery on her ovary. 3. Cataract surgery about 2 months ago.  CURRENT MEDICATIONS: 1. Albuterol 2 puffs every 6 hours. 2. Amlodipine 10 mg daily. 3. Aspirin 81 mg daily. 4. Cholecalciferol 10,000 units daily. 5. Simvastatin 20 mg daily. 6. Tramadol/acetaminophen 37.5/325 b.i.d. for arthritic pain.  She was admitted to the hospital in August 2012 with what was felt to be a TIA.  At that time, she developed sudden onset of weakness in the right lower extremity with numbness and dizziness.  She also felt like she was going to pass out, but did not.  Her symptoms improved while she was in the emergency room.  VITAL SIGNS:  She is currently in the emergency room.  Her temperature is 98.9, pulse 92, respiratory rate 18, blood pressure 121/43. GENERAL:  She appears to be a good historian.  She is in no acute distress.  She is morbidly obese. HEART:  Irregular irregularity with a rate of about 90. LUNGS:  Appear clear throughout and she is moving air well. ABDOMEN:  Morbidly obese, but no obvious abnormality is palpated. EXTREMITIES:  She has trace to 1+ pitting edema of the distal legs and feet.  Her  admission chem profile was essentially normal, except albumin 3.3. Her troponin was 0.30.  CBC essentially normal, except there were 30% neutrophils and 53% lymphs.  Her INR was 0.90.  Sugar was 76.  Admission UA shows specific gravity greater than 1.030, but there was trichomonas present.  A urine culture is pending.  CT scan of the head without contrast showed chronic ischemic microvascular white matter disease in the periventricular corona radiata white matter.  Chest x-ray showed enlargement of the cardiac silhouette.  Her EKG showed atrial fibrillation.  The first one had a rate of 100. Compared to EKG of December 29, 2012, she now is in atrial fibrillation versus sinus rhythm at that time.  A recheck EKG several hours later showed again what appeared to be atrial fibrillation.  There were fib/flutter waves.  ADMISSION DIAGNOSES: 1. New onset atrial fibrillation. 2. Near syncopal episode, probably secondary to atrial fibrillation. 3. Benign essential hypertension. 4. History of diabetes. 5. Hyperlipidemia. 6. Question past transient ischemic attack. 7. Chronic knee pain. 8. Chronic back pain. 9. Trichomonas in the urine. 10.Morbid obesity. 11.Asthma.  She will be admitted to the hospital to the ICU on IV fluids and at present  will use diltiazem 30 mg p.o. q.6 hours to make sure she is rate controlled.  I am stopping her amlodipine while she is on this.  I will also start her on warfarin and Lovenox.  I talked to her about this.  A TSH is pending.  I am not treating the trichomonas at present, but will leave that up to Dr. Felecia Shelling.     Mila Homer. Sudie Bailey, M.D.     SDK/MEDQ  D:  03/09/2013  T:  03/10/2013  Job:  [id number]

## 2013-03-11 LAB — URINE CULTURE: Colony Count: 10000

## 2013-03-11 LAB — GLUCOSE, CAPILLARY: Glucose-Capillary: 112 mg/dL — ABNORMAL HIGH (ref 70–99)

## 2013-03-11 LAB — PROTIME-INR
INR: 1.37 (ref 0.00–1.49)
Prothrombin Time: 16.5 seconds — ABNORMAL HIGH (ref 11.6–15.2)

## 2013-03-11 MED ORDER — WARFARIN SODIUM 7.5 MG PO TABS
7.5000 mg | ORAL_TABLET | Freq: Once | ORAL | Status: AC
  Administered 2013-03-11: 7.5 mg via ORAL
  Filled 2013-03-11: qty 1

## 2013-03-11 NOTE — Progress Notes (Signed)
Subjective: Patient is resting. She feels better. Her heart rate is controlled.  Objective: Vital signs in last 24 hours: Temp:  [97.8 F (36.6 C)-98.1 F (36.7 C)] 98 F (36.7 C) (04/06 0455) Pulse Rate:  [79-88] 88 (04/06 0455) Resp:  [18] 18 (04/06 0455) BP: (105-125)/(72-85) 109/74 mmHg (04/06 0455) SpO2:  [92 %-97 %] 94 % (04/06 0455) Weight change:  Last BM Date: 03/09/13  Intake/Output from previous day: 04/05 0701 - 04/06 0700 In: 240 [P.O.:240] Out: 203 [Urine:203]  PHYSICAL EXAM General appearance: alert and no distress Resp: diminished breath sounds bilaterally and rhonchi bilaterally Cardio: regularly irregular rhythm GI: soft, non-tender; bowel sounds normal; no masses,  no organomegaly Extremities: extremities normal, atraumatic, no cyanosis or edema  Lab Results:    @labtest @ [facility] No results found for this basename: PHART, PCO2, PO2ART, TCO2, HCO3,  in the last 72 hours CULTURES No results found for this or any previous visit (from the past 240 hour(s)). Studies/Results: Dg Chest 1 View  03/09/2013  *RADIOLOGY REPORT*  Clinical Data: History of weakness, diabetes, asthma, and hypertension.  CHEST - 1 VIEW  Comparison: 12/15/2011.  Findings: There is moderate enlargement cardiac silhouette accentuated by AP magnification.  No pulmonary edema, pneumonia, or pleural effusion is seen.  No skeletal lesions evident.  There is obesity.  IMPRESSION: Enlargement of the cardiac silhouette with no evidence of pulmonary edema or pleural effusion.  Obesity.   Original Report Authenticated By: Onalee Hua Call    Ct Head Wo Contrast  03/09/2013  *RADIOLOGY REPORT*  Clinical Data: Dizziness.  Weakness in both legs.  CT HEAD WITHOUT CONTRAST  Technique:  Contiguous axial images were obtained from the base of the skull through the vertex without contrast.  Comparison: 07/16/2011  Findings: The brain stem, cerebellum, cerebral peduncles, thalami, basal ganglia, basilar cisterns, and  ventricular system appear unremarkable.  Periventricular and corona radiata white matter hypodensities are most compatible with chronic ischemic microvascular white matter disease.  No intracranial hemorrhage, mass lesion, or acute infarction is identified.  Chronic ethmoid sinusitis observed.  Small right mastoid effusion.  IMPRESSION:  1. Periventricular and corona radiata white matter hypodensities are most compatible with chronic ischemic microvascular white matter disease. 2.  Chronic ethmoid sinusitis with small right mastoid effusion.   Original Report Authenticated By: Gaylyn Rong, M.D.     Medications: I have reviewed the patient's current medications.  Assesment: . New onset atrial fibrillation.  2. Near syncopal episode, probably secondary to atrial fibrillation.  3. Benign essential hypertension.  4. History of diabetes.  5. Hyperlipidemia.  6. Question past transient ischemic attack.  7. Chronic knee pain.  8. Chronic back pain.  9. Trichomonas in the urine.  10.Morbid obesity.  11.Asthma.  Active Problems:   * No active hospital problems. *    Plan: Continue Cardizem and anticoagulation Will do cardiology consult Continue regular treatment    LOS: 2 days   Justan Gaede 03/11/2013, 9:18 AM

## 2013-03-11 NOTE — Progress Notes (Signed)
ANTICOAGULATION CONSULT NOTE  Pharmacy Consult for Lovenox / Warfarin Indication: atrial fibrillation  No Known Allergies  Patient Measurements: Height: 5' (152.4 cm) Weight: 297 lb 13.5 oz (135.1 kg) IBW/kg (Calculated) : 45.5  Vital Signs: Temp: 98 F (36.7 C) (04/06 0455) Temp src: Oral (04/06 0455) BP: 109/74 mmHg (04/06 0455) Pulse Rate: 88 (04/06 0455)  Labs:  Recent Labs  03/09/13 1425 03/09/13 1442 03/10/13 0730 03/11/13 0610  HGB 12.4  --  10.9*  --   HCT 38.8  --  34.0*  --   PLT 229  --  192  --   LABPROT 12.1  --  12.9 16.5*  INR 0.90  --  0.98 1.37  CREATININE 0.99  --   --   --   TROPONINI  --  <0.30  --   --     Estimated Creatinine Clearance: 68.8 ml/min (by C-G formula based on Cr of 0.99).   Medical History: Past Medical History  Diagnosis Date  . Diabetes mellitus   . Asthma   . Chronic knee pain   . Back pain, chronic   . Hypertension   . Vertigo   . Hyperlipidemia   . TIA (transient ischemic attack)   . Anemia    Medications:  Prescriptions prior to admission  Medication Sig Dispense Refill  . albuterol (PROAIR HFA) 108 (90 BASE) MCG/ACT inhaler Inhale 2 puffs into the lungs every 6 (six) hours as needed for wheezing or shortness of breath.      Marland Kitchen amLODipine (NORVASC) 10 MG tablet Take 10 mg by mouth daily.      Marland Kitchen aspirin EC 81 MG tablet Take 81 mg by mouth every morning.       . cholecalciferol (VITAMIN D) 1000 UNITS tablet Take 1,000 Units by mouth every morning.       . simvastatin (ZOCOR) 20 MG tablet Take 20 mg by mouth every morning.       . traMADol-acetaminophen (ULTRACET) 37.5-325 MG per tablet Take 1 tablet by mouth 2 (two) times daily as needed (for arthritis pain). Pain       Assessment: Okay for Protocol Hx vaginal bleeding per ED notes.  Goal of Therapy:  INR 2-3   Plan:  Continue Lovenox 70mg  SQ every 24 hours for VTE prophylaxis (adjusted to obesity). Warfarin 7.5 mg PO x 1.  Mady Gemma 03/11/2013,7:55  AM

## 2013-03-12 ENCOUNTER — Encounter (HOSPITAL_COMMUNITY): Payer: Self-pay | Admitting: Adult Health

## 2013-03-12 DIAGNOSIS — I10 Essential (primary) hypertension: Secondary | ICD-10-CM

## 2013-03-12 DIAGNOSIS — I4891 Unspecified atrial fibrillation: Principal | ICD-10-CM

## 2013-03-12 DIAGNOSIS — IMO0001 Reserved for inherently not codable concepts without codable children: Secondary | ICD-10-CM

## 2013-03-12 DIAGNOSIS — E785 Hyperlipidemia, unspecified: Secondary | ICD-10-CM

## 2013-03-12 DIAGNOSIS — I517 Cardiomegaly: Secondary | ICD-10-CM

## 2013-03-12 DIAGNOSIS — D649 Anemia, unspecified: Secondary | ICD-10-CM

## 2013-03-12 DIAGNOSIS — G4730 Sleep apnea, unspecified: Secondary | ICD-10-CM

## 2013-03-12 LAB — CBC
HCT: 31.9 % — ABNORMAL LOW (ref 36.0–46.0)
Hemoglobin: 10 g/dL — ABNORMAL LOW (ref 12.0–15.0)
MCH: 30.4 pg (ref 26.0–34.0)
MCHC: 31.3 g/dL (ref 30.0–36.0)
MCV: 97 fL (ref 78.0–100.0)
Platelets: 187 10*3/uL (ref 150–400)
RBC: 3.29 MIL/uL — ABNORMAL LOW (ref 3.87–5.11)
RDW: 15.7 % — ABNORMAL HIGH (ref 11.5–15.5)
WBC: 3.6 10*3/uL — ABNORMAL LOW (ref 4.0–10.5)

## 2013-03-12 LAB — PROTIME-INR
INR: 1.81 — ABNORMAL HIGH (ref 0.00–1.49)
Prothrombin Time: 20.3 seconds — ABNORMAL HIGH (ref 11.6–15.2)

## 2013-03-12 MED ORDER — WARFARIN SODIUM 5 MG PO TABS
5.0000 mg | ORAL_TABLET | Freq: Once | ORAL | Status: AC
  Administered 2013-03-12: 5 mg via ORAL
  Filled 2013-03-12: qty 1

## 2013-03-12 MED ORDER — METRONIDAZOLE 500 MG PO TABS
500.0000 mg | ORAL_TABLET | Freq: Two times a day (BID) | ORAL | Status: DC
  Administered 2013-03-12 – 2013-03-13 (×2): 500 mg via ORAL
  Filled 2013-03-12 (×2): qty 1

## 2013-03-12 NOTE — Progress Notes (Signed)
Pt given two handouts, one regarding A. Fib and one regarding Diltiazem.  Pt encouraged to read handouts and relay any questions she may have regarding content. Verbalized understanding.

## 2013-03-12 NOTE — Progress Notes (Signed)
*  PRELIMINARY RESULTS* Echocardiogram 2D Echocardiogram has been performed.  Conrad Saratoga 03/12/2013, 10:19 AM

## 2013-03-12 NOTE — Progress Notes (Signed)
ANTICOAGULATION CONSULT NOTE  Pharmacy Consult for Lovenox / Warfarin Indication: atrial fibrillation  No Known Allergies  Patient Measurements: Height: 5' (152.4 cm) Weight: 297 lb 13.5 oz (135.1 kg) IBW/kg (Calculated) : 45.5  Vital Signs: Temp: 98.2 F (36.8 C) (04/07 0420) Temp src: Oral (04/07 0420) BP: 123/79 mmHg (04/07 0420) Pulse Rate: 91 (04/07 0420)  Labs:  Recent Labs  03/09/13 1425 03/09/13 1442 03/10/13 0730 03/11/13 0610 03/12/13 0542  HGB 12.4  --  10.9*  --  10.0*  HCT 38.8  --  34.0*  --  31.9*  PLT 229  --  192  --  187  LABPROT 12.1  --  12.9 16.5* 20.3*  INR 0.90  --  0.98 1.37 1.81*  CREATININE 0.99  --   --   --   --   TROPONINI  --  <0.30  --   --   --     Estimated Creatinine Clearance: 68.8 ml/min (by C-G formula based on Cr of 0.99).   Medical History: Past Medical History  Diagnosis Date  . Diabetes mellitus   . Asthma   . Chronic knee pain   . Back pain, chronic   . Hypertension   . Vertigo   . Hyperlipidemia   . TIA (transient ischemic attack)   . Anemia    Medications:  Prescriptions prior to admission  Medication Sig Dispense Refill  . albuterol (PROAIR HFA) 108 (90 BASE) MCG/ACT inhaler Inhale 2 puffs into the lungs every 6 (six) hours as needed for wheezing or shortness of breath.      Marland Kitchen amLODipine (NORVASC) 10 MG tablet Take 10 mg by mouth daily.      Marland Kitchen aspirin EC 81 MG tablet Take 81 mg by mouth every morning.       . cholecalciferol (VITAMIN D) 1000 UNITS tablet Take 1,000 Units by mouth every morning.       . simvastatin (ZOCOR) 20 MG tablet Take 20 mg by mouth every morning.       . traMADol-acetaminophen (ULTRACET) 37.5-325 MG per tablet Take 1 tablet by mouth 2 (two) times daily as needed (for arthritis pain). Pain       Assessment: Okay for Protocol Hx vaginal bleeding per ED notes. INR is rising but remains below goal.  Goal of Therapy:  INR 2-3   Plan:  Continue Lovenox 70mg  SQ every 24 hours for VTE  prophylaxis (adjusted to obesity). Warfarin 5 mg PO x 1 today  Valrie Hart A 03/12/2013,9:36 AM

## 2013-03-12 NOTE — Progress Notes (Signed)
Subjective: Patient is resting. Her heart rate is controlled. She is complaining knee and hip joint pain.  Objective: Vital signs in last 24 hours: Temp:  [97.9 F (36.6 C)-98.2 F (36.8 C)] 98.2 F (36.8 C) (04/07 0420) Pulse Rate:  [84-91] 91 (04/07 0420) Resp:  [18] 18 (04/07 0420) BP: (93-132)/(60-89) 123/79 mmHg (04/07 0420) SpO2:  [95 %-96 %] 95 % (04/07 0420) Weight change:  Last BM Date: 03/09/13  Intake/Output from previous day: 04/06 0701 - 04/07 0700 In: 1579 [P.O.:870; I.V.:709] Out: 150 [Urine:150]  PHYSICAL EXAM General appearance: alert and no distress Resp: diminished breath sounds bilaterally and rhonchi bilaterally Cardio: regularly irregular rhythm GI: soft, non-tender; bowel sounds normal; no masses,  no organomegaly Extremities: extremities normal, atraumatic, no cyanosis or edema  Lab Results:    @labtest @ [facility] No results found for this basename: PHART, PCO2, PO2ART, TCO2, HCO3,  in the last 72 hours CULTURES Recent Results (from the past 240 hour(s))  URINE CULTURE     Status: None   Collection Time    03/09/13  3:44 PM      Result Value Range Status   Specimen Description URINE, CATHETERIZED   Final   Special Requests NONE   Final   Culture  Setup Time 03/10/2013 06:20   Final   Colony Count 10,000 COLONIES/ML   Final   Culture     Final   Value: LACTOBACILLUS SPECIES     Note: Standardized susceptibility testing for this organism is not available.   Report Status 03/11/2013 FINAL   Final   Studies/Results: No results found.  Medications: I have reviewed the patient's current medications.  Assesment: . New onset atrial fibrillation.  2. Near syncopal episode, probably secondary to atrial fibrillation.  3. Benign essential hypertension.  4. History of diabetes.  5. Hyperlipidemia.  6. Question past transient ischemic attack.  7. Chronic knee pain.  8. Chronic back pain.  9.Osteoarthritis   10.Morbid obesity.   11.Asthma.  Active Problems:   * No active hospital problems. *    Plan: Continue Cardizem and anticoagulation  cardiology consult Echo Continue regular treatment    LOS: 3 days   Jetty Berland 03/12/2013, 7:16 AM

## 2013-03-12 NOTE — Consult Note (Addendum)
CARDIOLOGY CONSULT NOTE  Patient ID: [name] MRN: [id number] DOB/AGE: 70-10-1941 70 y.o.  Admit date: 03/09/2013 Referring Physician: Jabier Gauss, MD Primary Cardiologist: Teddy Spike Reason for Consultation: New Onset Atrial Fibrillation Active Problems:   Atrial fibrillation   DIABETES MELLITUS, CONTROLLED   HYPERCHOLESTEROLEMIA   Essential hypertension, benign  HPI: This is [name] is a morbidly obese female patient with no prior cardiac history , but with multiple cardiovascular risk factors to include, hypertension, diabetes, tobacco abuse (stopped in January of this year after several years) and hypercholesterolemia who presented to the emergency room after 4 days of intermittent dizziness weakness and near syncope. She states that additionally she began to feel very lightheaded and dizzy since Tuesday of last week. Lying down would make it go away and she would feel better. Reportedly on the day of admission she was walking into her bedroom when she had sudden onset of presyncope with associated diaphoresis. Her son brought her to the emergency room where she was found to be in atrial fibrillation with RVR. She was started on by mouth Cardizem, LMWH and meclizine.     EKG revealed atrial fibrillation with a rate of 100 beats per minute, troponin negative less than 0.30, potassium of 3.5 on admission, with no evidence of anemia. She was however found to have a UTI , with trichomonas found on initial urinalysis. She states she has obstructive sleep apnea, and does not use CPAP as she was unable to afford it. Currently the patient is rate controlled in the 70s and 80s on diltiazem 30 mg every 6 hours. She is started on Coumadin per pharmacy. She is without complaint at this time of shortness of breath dizziness or weakness. She is unaware of her irregular heart rate.      Review of systems complete and found to be negative unless listed above    Past Medical History  Diagnosis Date  . Diabetes mellitus   . Asthma   . Chronic knee pain   . Back pain, chronic   . Hypertension   . Vertigo   . Hyperlipidemia   . TIA (transient ischemic attack)   . Anemia     Family History  Problem Relation Age of Onset  . Arrhythmia Father     Atrial fib/Pacemaker  . Heart failure Mother   . Diabetes Mother   . Diabetes Father   . Diabetes Sister     History   Social History  . Marital Status: Widowed    Spouse Name: N/A    Number of Children: N/A  . Years of Education: N/A   Occupational History  . Not on file.   Social History Main Topics  . Smoking status: Current Every Day Smoker -- 0.25 packs/day  . Smokeless tobacco: Not on file  . Alcohol Use: No  . Drug Use: No  . Sexually Active: Yes    Birth Control/ Protection: Other-see comments   Other Topics Concern  . Not on file   Social History Narrative  . No narrative on file    Past Surgical History  Procedure Laterality Date  . Appendectomy    . Ovary surgery    . Cataract extraction w/phaco Left 01/16/2013    Procedure: CATARACT EXTRACTION PHACO AND INTRAOCULAR LENS PLACEMENT (IOC);  Surgeon: Loraine Leriche T. Nile Riggs, MD;  Location: [facility];  Service: Ophthalmology;  Laterality: Left;  CDE:14.37     Prescriptions prior to admission  Medication Sig Dispense Refill  . albuterol (  PROAIR HFA) 108 (90 BASE) MCG/ACT inhaler Inhale 2 puffs into the lungs every 6 (six) hours as needed for wheezing or shortness of breath.      Marland Kitchen amLODipine (NORVASC) 10 MG tablet Take 10 mg by mouth daily.      Marland Kitchen aspirin EC 81 MG tablet Take 81 mg by mouth every morning.       . cholecalciferol (VITAMIN D) 1000 UNITS tablet Take 1,000 Units by mouth every morning.       . simvastatin (ZOCOR) 20 MG tablet Take 20 mg by mouth every morning.       . traMADol-acetaminophen (ULTRACET) 37.5-325 MG per tablet Take 1 tablet by mouth 2 (two) times daily as needed (for arthritis pain). Pain         Physical Exam: Blood pressure 126/59, pulse 82, temperature 98.1 F (36.7 C), temperature source Oral, resp. rate 18, height 5' (1.524 m), weight 297 lb 13.5 oz (135.1 kg), SpO2 98.00%.   General: Well developed, well nourished, in no acute distress, morbidly obese Head: Eyes PERRLA, No xanthomas.   Normal cephalic and atramatic  Lungs: Clear bilaterally to auscultation and percussion. Heart: HRIR S1 S2, without MRG.  Pulses are 2+ & equal.            No carotid bruit. No JVD.  No abdominal bruits. No femoral bruits. Abdomen: Bowel sounds are positive, abdomen soft and non-tender without masses or                  Hernia's noted. Msk:  Back normal, normal gait. Normal strength and tone for age. Extremities: No clubbing, cyanosis or edema.  DP +1 Neuro: Alert and oriented X 3. Psych:  Good affect, responds appropriately  Labs:   Lab Results  Component Value Date   WBC 3.6* 03/12/2013   HGB 10.0* 03/12/2013   HCT 31.9* 03/12/2013   MCV 97.0 03/12/2013   PLT 187 03/12/2013     Recent Labs Lab 03/09/13 1425  NA 140  K 3.5  CL 102  CO2 29  BUN 22  CREATININE 0.99  CALCIUM 9.5  PROT 7.3  BILITOT 0.2*  ALKPHOS 83  ALT 11  AST 14  GLUCOSE 76   Lab Results  Component Value Date   TROPONINI <0.30 03/09/2013    Radiology: CHEST - 1 VIEW 4/42014 Findings: There is moderate enlargement cardiac silhouette accentuated by AP magnification. No pulmonary edema, pneumonia, or pleural effusion is seen. No skeletal lesions evident. There is obesity. IMPRESSION: Enlargement of the cardiac silhouette with no evidence of pulmonary edema or pleural effusion. Obesity.  Echocardiogram: 03/12/2013 Left ventricle: The cavity size was normal. Wall thickness was increased in a pattern of mild LVH. Systolic function was normal. The estimated ejection fraction was in the range of 55% to 60%. - Left atrium: The atrium was mildly dilated. - Right ventricle: RV endocardium is difficult to  see. Cannot evaluate overall function.   EKG: Atrial fibrillation with rate of 100 bpm. New finding compared to January of 2014  ASSESSMENT AND PLAN:   1. New Onset Atrial Fibrillation: The patient was symptomatic with near syncope, diaphoresis, and generalized weakness. She was unaware of irregular heart rhythm and does not know how long this has been occurring. She has a history of obstructive sleep apnea, but does not wear CPAP. This can be contributing to atrial fibrillation. Heart rate is well-controlled on by mouth Cardizem, and she is being evaluated for Coumadin dosing.CHADS Score 2 (Hypertension and diabetes).  I have reviewed results of echocardiogram revealing only mildly dilated left atrium. She may be a candidate for DCCV after 4 weeks of anticoagulation, however OSA may cause recurrence.   2. Hypertension: Pressure is currently well controlled, on Cardizem only. Review of home medications reveal that she was on amlodipine 10 mg daily. Echocardiogram reveals LVH.  3. Diabetes: One of many risk factors which can contribute to CAD. Hemoglobin A1c 6.6 in August with no current level at this time. Will order.   4. Tobacco Abuse: The patient has stopped smoking in January of this year after approximately 30 years or more of abuse.  5. Hypercholesterolemia: Will order fasting lipids and LFTs in the a.m. Most recent evaluation in Epic reveals a cholesterol of 211, with triglycerides of 121, and LDL of 126 in 2011. She is not currently on a statin.  Would recomm lipitor 10 mg.    6. Morbid Obesity: BMI of 58.3  7. UTI: Not currently on antibiotics. Will begin  Flagyl  500 mg twice a day in the setting of trichomonas. Consider Ob-Gyn consult.   Signed: Bettey Mare. Lyman Bishop NP [facility] 03/12/2013, 4:30 PM Co-Sign MD  Patient seen and examined.  I have amended note above.   Patient a morbidly obese 72 yo with newly diagnosed atrial fibrillation. Plan for coumadin and rate  control with cardioversion 1 month after adequate anticoagulation. Patient feeling better with rates down.  Would still check orthostatics Would recomm lipitor given risk factors. Need to contact SW to look into funding for CPAP  Without it, afib will certainly return.

## 2013-03-12 NOTE — Care Management Note (Signed)
    Page 1 of 1   03/13/2013     11:19:43 AM   CARE MANAGEMENT NOTE 03/13/2013  Patient:  [name], [name]   Account Number:  [id number]  Date Initiated:  03/12/2013  Documentation initiated by:  Sharrie Rothman  Subjective/Objective Assessment:   Pt admitted from home with new onset A fib. Pt lives with her son who helps pt as needed. Pt stated that she does her own ADL's and still cleans her house but is slow due to arthritis.     Action/Plan:   Will continue to monitor for [facility] needs. Pt will discharge on coumadin and possibly lovenox.   Anticipated DC Date:  03/14/2013   Anticipated DC Plan:  HOME/SELF CARE      DC Planning Services  CM consult      Choice offered to / List presented to:             Status of service:  Completed, signed off Medicare Important Message given?  YES (If response is "NO", the following Medicare IM given date fields will be blank) Date Medicare IM given:  03/13/2013 Date Additional Medicare IM given:    Discharge Disposition:  HOME/SELF CARE  Per UR Regulation:    If discussed at Long Length of Stay Meetings, dates discussed:    Comments:  03/13/13 1120 Arlyss Queen, RN BSN CM Pt discharged home today. No CM needs noted. Pts followup for coumadin documented on pts AVS and she is aware. Pt also given information on [employer] and [employer].  03/12/13 1037 Arlyss Queen, RN BSN CM

## 2013-03-12 NOTE — Progress Notes (Signed)
UR chart review completed.  

## 2013-03-13 DIAGNOSIS — E785 Hyperlipidemia, unspecified: Secondary | ICD-10-CM | POA: Insufficient documentation

## 2013-03-13 DIAGNOSIS — R55 Syncope and collapse: Secondary | ICD-10-CM | POA: Insufficient documentation

## 2013-03-13 DIAGNOSIS — I4891 Unspecified atrial fibrillation: Secondary | ICD-10-CM | POA: Insufficient documentation

## 2013-03-13 DIAGNOSIS — G4730 Sleep apnea, unspecified: Secondary | ICD-10-CM | POA: Insufficient documentation

## 2013-03-13 LAB — PROTIME-INR
INR: 1.83 — ABNORMAL HIGH (ref 0.00–1.49)
Prothrombin Time: 20.5 seconds — ABNORMAL HIGH (ref 11.6–15.2)

## 2013-03-13 MED ORDER — WARFARIN SODIUM 5 MG PO TABS: 5.0000 mg | ORAL_TABLET | Freq: Every day | ORAL | Status: DC

## 2013-03-13 MED ORDER — LEVALBUTEROL TARTRATE 45 MCG/ACT IN AERO: 2.0000 | INHALATION_SPRAY | Freq: Four times a day (QID) | RESPIRATORY_TRACT | Status: DC | PRN

## 2013-03-13 MED ORDER — METRONIDAZOLE 500 MG PO TABS: 500.0000 mg | ORAL_TABLET | Freq: Two times a day (BID) | ORAL | Status: DC

## 2013-03-13 MED ORDER — DILTIAZEM HCL 120 MG PO TABS: 120.0000 mg | ORAL_TABLET | Freq: Four times a day (QID) | ORAL | Status: DC

## 2013-03-13 NOTE — Discharge Summary (Signed)
Physician Discharge Summary  Patient ID: [name] MRN: [id number] DOB/AGE: [birth year] 70 y.o. Primary Care Physician:Gayatri Teasdale, MD Admit date: 03/09/2013 Discharge date: 03/13/2013    Discharge Diagnoses:    Active Problems:   DIABETES MELLITUS, CONTROLLED   HYPERCHOLESTEROLEMIA   Essential hypertension, benign   Atrial fibrillation     Medication List    STOP taking these medications       amLODipine 10 MG tablet  Commonly known as:  NORVASC     PROAIR HFA 108 (90 BASE) MCG/ACT inhaler  Generic drug:  albuterol      TAKE these medications       aspirin EC 81 MG tablet  Take 81 mg by mouth every morning.     cholecalciferol 1000 UNITS tablet  Commonly known as:  VITAMIN D  Take 1,000 Units by mouth every morning.     diltiazem 120 MG tablet  Commonly known as:  CARDIZEM  Take 1 tablet (120 mg total) by mouth 4 (four) times daily.     levalbuterol 45 MCG/ACT inhaler  Commonly known as:  XOPENEX HFA  Inhale 2 puffs into the lungs every 6 (six) hours as needed for wheezing or shortness of breath.     metroNIDAZOLE 500 MG tablet  Commonly known as:  FLAGYL  Take 1 tablet (500 mg total) by mouth every 12 (twelve) hours.     simvastatin 20 MG tablet  Commonly known as:  ZOCOR  Take 20 mg by mouth every morning.     traMADol-acetaminophen 37.5-325 MG per tablet  Commonly known as:  ULTRACET  Take 1 tablet by mouth 2 (two) times daily as needed (for arthritis pain). Pain     warfarin 5 MG tablet  Commonly known as:  COUMADIN  Take 1 tablet (5 mg total) by mouth daily.        Discharged Condition: improved    Consults: cardiology  Significant Diagnostic Studies: Dg Chest 1 View  03/09/2013  *RADIOLOGY REPORT*  Clinical Data: History of weakness, diabetes, asthma, and hypertension.  CHEST - 1 VIEW  Comparison: 12/15/2011.  Findings: There is moderate enlargement cardiac silhouette accentuated by AP magnification.  No pulmonary edema, pneumonia,  or pleural effusion is seen.  No skeletal lesions evident.  There is obesity.  IMPRESSION: Enlargement of the cardiac silhouette with no evidence of pulmonary edema or pleural effusion.  Obesity.   Original Report Authenticated By: Onalee Hua Call    Ct Head Wo Contrast  03/09/2013  *RADIOLOGY REPORT*  Clinical Data: Dizziness.  Weakness in both legs.  CT HEAD WITHOUT CONTRAST  Technique:  Contiguous axial images were obtained from the base of the skull through the vertex without contrast.  Comparison: 07/16/2011  Findings: The brain stem, cerebellum, cerebral peduncles, thalami, basal ganglia, basilar cisterns, and ventricular system appear unremarkable.  Periventricular and corona radiata white matter hypodensities are most compatible with chronic ischemic microvascular white matter disease.  No intracranial hemorrhage, mass lesion, or acute infarction is identified.  Chronic ethmoid sinusitis observed.  Small right mastoid effusion.  IMPRESSION:  1. Periventricular and corona radiata white matter hypodensities are most compatible with chronic ischemic microvascular white matter disease. 2.  Chronic ethmoid sinusitis with small right mastoid effusion.   Original Report Authenticated By: Gaylyn Rong, M.D.     Lab Results: Basic Metabolic Panel: No results found for this basename: NA, K, CL, CO2, GLUCOSE, BUN, CREATININE, CALCIUM, MG, PHOS,  in the last 72 hours Liver Function Tests: No results found for this  basename: AST, ALT, ALKPHOS, BILITOT, PROT, ALBUMIN,  in the last 72 hours   CBC:  Recent Labs  03/12/13 0542  WBC 3.6*  HGB 10.0*  HCT 31.9*  MCV 97.0  PLT 187    Recent Results (from the past 240 hour(s))  URINE CULTURE     Status: None   Collection Time    03/09/13  3:44 PM      Result Value Range Status   Specimen Description URINE, CATHETERIZED   Final   Special Requests NONE   Final   Culture  Setup Time 03/10/2013 06:20   Final   Colony Count 10,000 COLONIES/ML   Final    Culture     Final   Value: LACTOBACILLUS SPECIES     Note: Standardized susceptibility testing for this organism is not available.   Report Status 03/11/2013 FINAL   Final     Hospital Course:  This is a 72 years old female patient was admitted due new onset atrial fibrillation and presyncopal episode. Patient was admitted under telemetry. Her heart rate was controlled with Cardizem and started on anticoagulation. Patient was seen by cardiology. She will discharge on coumadin and her PT/INR will monitored in coumadin clinic and will be followed by cardiologist.   Discharge Exam: Blood pressure 138/91, pulse 93, temperature 97.8 F (36.6 C), temperature source Oral, resp. rate 19, height 5' (1.524 m), weight 135.1 kg (297 lb 13.5 oz), SpO2 96.00%.    Disposition:  Home       Discharge Orders   Future Orders Complete By Expires     Ambulatory referral to Anticoagulation Monitoring  As directed     Questions:      Responsible Group:  [facility] [place]    [employer] Date:  03/13/2013    INR Goal:      Date of first INR:           Signed: Blaise Grieshaber   03/13/2013, 8:28 AM

## 2013-03-13 NOTE — Progress Notes (Addendum)
Pt and her daughter verbalize understanding of d/c instructions, follow up appts and medication changes. IV d/c. Pt d/c via wheelchair accompanied by myself and her daughter. No questions at this time. Sheryn Bison

## 2013-03-15 ENCOUNTER — Ambulatory Visit (INDEPENDENT_AMBULATORY_CARE_PROVIDER_SITE_OTHER): Payer: Medicare Other | Admitting: *Deleted

## 2013-03-15 DIAGNOSIS — I10 Essential (primary) hypertension: Secondary | ICD-10-CM

## 2013-03-15 DIAGNOSIS — R809 Proteinuria, unspecified: Secondary | ICD-10-CM

## 2013-03-15 DIAGNOSIS — S4360XA Sprain of unspecified sternoclavicular joint, initial encounter: Secondary | ICD-10-CM

## 2013-03-15 DIAGNOSIS — I4891 Unspecified atrial fibrillation: Secondary | ICD-10-CM

## 2013-03-15 DIAGNOSIS — G4730 Sleep apnea, unspecified: Secondary | ICD-10-CM

## 2013-03-15 DIAGNOSIS — J45909 Unspecified asthma, uncomplicated: Secondary | ICD-10-CM

## 2013-03-15 DIAGNOSIS — J329 Chronic sinusitis, unspecified: Secondary | ICD-10-CM

## 2013-03-15 DIAGNOSIS — E7800 Pure hypercholesterolemia, unspecified: Secondary | ICD-10-CM

## 2013-03-15 DIAGNOSIS — M1710 Unilateral primary osteoarthritis, unspecified knee: Secondary | ICD-10-CM

## 2013-03-15 DIAGNOSIS — R55 Syncope and collapse: Secondary | ICD-10-CM

## 2013-03-15 DIAGNOSIS — M67919 Unspecified disorder of synovium and tendon, unspecified shoulder: Secondary | ICD-10-CM

## 2013-03-15 DIAGNOSIS — B029 Zoster without complications: Secondary | ICD-10-CM

## 2013-03-15 DIAGNOSIS — D649 Anemia, unspecified: Secondary | ICD-10-CM

## 2013-03-15 DIAGNOSIS — IMO0001 Reserved for inherently not codable concepts without codable children: Secondary | ICD-10-CM

## 2013-03-15 DIAGNOSIS — E785 Hyperlipidemia, unspecified: Secondary | ICD-10-CM

## 2013-03-15 DIAGNOSIS — M719 Bursopathy, unspecified: Secondary | ICD-10-CM

## 2013-03-15 DIAGNOSIS — E119 Type 2 diabetes mellitus without complications: Secondary | ICD-10-CM

## 2013-03-15 LAB — POCT INR: INR: 2

## 2013-03-21 ENCOUNTER — Ambulatory Visit (INDEPENDENT_AMBULATORY_CARE_PROVIDER_SITE_OTHER): Payer: Medicare Other | Admitting: *Deleted

## 2013-03-21 DIAGNOSIS — E7800 Pure hypercholesterolemia, unspecified: Secondary | ICD-10-CM

## 2013-03-21 DIAGNOSIS — I4891 Unspecified atrial fibrillation: Secondary | ICD-10-CM

## 2013-03-21 DIAGNOSIS — J45909 Unspecified asthma, uncomplicated: Secondary | ICD-10-CM

## 2013-03-21 DIAGNOSIS — R809 Proteinuria, unspecified: Secondary | ICD-10-CM

## 2013-03-21 DIAGNOSIS — E119 Type 2 diabetes mellitus without complications: Secondary | ICD-10-CM

## 2013-03-21 DIAGNOSIS — G4730 Sleep apnea, unspecified: Secondary | ICD-10-CM

## 2013-03-21 DIAGNOSIS — M67919 Unspecified disorder of synovium and tendon, unspecified shoulder: Secondary | ICD-10-CM

## 2013-03-21 DIAGNOSIS — B029 Zoster without complications: Secondary | ICD-10-CM

## 2013-03-21 DIAGNOSIS — M719 Bursopathy, unspecified: Secondary | ICD-10-CM

## 2013-03-21 DIAGNOSIS — D649 Anemia, unspecified: Secondary | ICD-10-CM

## 2013-03-21 DIAGNOSIS — IMO0001 Reserved for inherently not codable concepts without codable children: Secondary | ICD-10-CM

## 2013-03-21 DIAGNOSIS — E785 Hyperlipidemia, unspecified: Secondary | ICD-10-CM

## 2013-03-21 DIAGNOSIS — R55 Syncope and collapse: Secondary | ICD-10-CM

## 2013-03-21 DIAGNOSIS — S4360XA Sprain of unspecified sternoclavicular joint, initial encounter: Secondary | ICD-10-CM

## 2013-03-21 DIAGNOSIS — J329 Chronic sinusitis, unspecified: Secondary | ICD-10-CM

## 2013-03-21 DIAGNOSIS — M1710 Unilateral primary osteoarthritis, unspecified knee: Secondary | ICD-10-CM

## 2013-03-21 DIAGNOSIS — I10 Essential (primary) hypertension: Secondary | ICD-10-CM

## 2013-03-21 LAB — POCT INR: INR: 3.4

## 2013-03-31 ENCOUNTER — Encounter (HOSPITAL_COMMUNITY): Payer: Self-pay | Admitting: *Deleted

## 2013-03-31 ENCOUNTER — Inpatient Hospital Stay (HOSPITAL_COMMUNITY)
Admission: EM | Admit: 2013-03-31 | Discharge: 2013-04-04 | DRG: 744 | Disposition: A | Discharge status: Home / Self Care | Payer: Medicare Other | Attending: Internal Medicine | Admitting: Internal Medicine

## 2013-03-31 ENCOUNTER — Emergency Department (HOSPITAL_COMMUNITY): Payer: Medicare Other

## 2013-03-31 DIAGNOSIS — I10 Essential (primary) hypertension: Secondary | ICD-10-CM | POA: Diagnosis present

## 2013-03-31 DIAGNOSIS — Z79899 Other long term (current) drug therapy: Secondary | ICD-10-CM

## 2013-03-31 DIAGNOSIS — F17200 Nicotine dependence, unspecified, uncomplicated: Secondary | ICD-10-CM | POA: Diagnosis present

## 2013-03-31 DIAGNOSIS — M25569 Pain in unspecified knee: Secondary | ICD-10-CM | POA: Diagnosis present

## 2013-03-31 DIAGNOSIS — Z8249 Family history of ischemic heart disease and other diseases of the circulatory system: Secondary | ICD-10-CM

## 2013-03-31 DIAGNOSIS — N939 Abnormal uterine and vaginal bleeding, unspecified: Secondary | ICD-10-CM

## 2013-03-31 DIAGNOSIS — D649 Anemia, unspecified: Secondary | ICD-10-CM | POA: Diagnosis present

## 2013-03-31 DIAGNOSIS — E8809 Other disorders of plasma-protein metabolism, not elsewhere classified: Secondary | ICD-10-CM | POA: Diagnosis present

## 2013-03-31 DIAGNOSIS — G8929 Other chronic pain: Secondary | ICD-10-CM | POA: Diagnosis present

## 2013-03-31 DIAGNOSIS — Z7901 Long term (current) use of anticoagulants: Secondary | ICD-10-CM

## 2013-03-31 DIAGNOSIS — E119 Type 2 diabetes mellitus without complications: Secondary | ICD-10-CM | POA: Diagnosis present

## 2013-03-31 DIAGNOSIS — Z9229 Personal history of other drug therapy: Secondary | ICD-10-CM

## 2013-03-31 DIAGNOSIS — Z833 Family history of diabetes mellitus: Secondary | ICD-10-CM

## 2013-03-31 DIAGNOSIS — M1990 Unspecified osteoarthritis, unspecified site: Secondary | ICD-10-CM | POA: Diagnosis present

## 2013-03-31 DIAGNOSIS — J45909 Unspecified asthma, uncomplicated: Secondary | ICD-10-CM | POA: Diagnosis present

## 2013-03-31 DIAGNOSIS — E785 Hyperlipidemia, unspecified: Secondary | ICD-10-CM | POA: Diagnosis present

## 2013-03-31 DIAGNOSIS — I4891 Unspecified atrial fibrillation: Secondary | ICD-10-CM | POA: Diagnosis present

## 2013-03-31 DIAGNOSIS — R55 Syncope and collapse: Secondary | ICD-10-CM

## 2013-03-31 DIAGNOSIS — N950 Postmenopausal bleeding: Principal | ICD-10-CM | POA: Diagnosis present

## 2013-03-31 DIAGNOSIS — Z6841 Body Mass Index (BMI) 40.0 and over, adult: Secondary | ICD-10-CM

## 2013-03-31 DIAGNOSIS — N390 Urinary tract infection, site not specified: Secondary | ICD-10-CM | POA: Diagnosis present

## 2013-03-31 DIAGNOSIS — Z7982 Long term (current) use of aspirin: Secondary | ICD-10-CM

## 2013-03-31 LAB — CBC WITH DIFFERENTIAL/PLATELET
Basophils Absolute: 0 10*3/uL (ref 0.0–0.1)
Basophils Relative: 0 % (ref 0–1)
Eosinophils Absolute: 0.1 10*3/uL (ref 0.0–0.7)
Eosinophils Relative: 2 % (ref 0–5)
HCT: 22.7 % — ABNORMAL LOW (ref 36.0–46.0)
Hemoglobin: 7.4 g/dL — ABNORMAL LOW (ref 12.0–15.0)
Lymphocytes Relative: 38 % (ref 12–46)
Lymphs Abs: 2 10*3/uL (ref 0.7–4.0)
MCH: 31.4 pg (ref 26.0–34.0)
MCHC: 32.6 g/dL (ref 30.0–36.0)
MCV: 96.2 fL (ref 78.0–100.0)
Monocytes Absolute: 0.4 10*3/uL (ref 0.1–1.0)
Monocytes Relative: 8 % (ref 3–12)
Neutro Abs: 2.8 10*3/uL (ref 1.7–7.7)
Neutrophils Relative %: 52 % (ref 43–77)
Platelets: 188 10*3/uL (ref 150–400)
RBC: 2.36 MIL/uL — ABNORMAL LOW (ref 3.87–5.11)
RDW: 16.2 % — ABNORMAL HIGH (ref 11.5–15.5)
WBC: 5.4 10*3/uL (ref 4.0–10.5)

## 2013-03-31 LAB — URINALYSIS, ROUTINE W REFLEX MICROSCOPIC
Bilirubin Urine: NEGATIVE
Glucose, UA: NEGATIVE mg/dL
Ketones, ur: NEGATIVE mg/dL
Leukocytes, UA: NEGATIVE
Nitrite: NEGATIVE
Protein, ur: 100 mg/dL — AB
Specific Gravity, Urine: >1.03 — ABNORMAL HIGH (ref 1.005–1.030)
Urobilinogen, UA: 0.2 mg/dL (ref 0.0–1.0)
pH: 6 (ref 5.0–8.0)

## 2013-03-31 LAB — PROTIME-INR
INR: 1.43 (ref 0.00–1.49)
Prothrombin Time: 17.1 seconds — ABNORMAL HIGH (ref 11.6–15.2)

## 2013-03-31 LAB — COMPREHENSIVE METABOLIC PANEL
ALT: 12 U/L (ref 0–35)
AST: 13 U/L (ref 0–37)
Albumin: 2.8 g/dL — ABNORMAL LOW (ref 3.5–5.2)
Alkaline Phosphatase: 68 U/L (ref 39–117)
BUN: 20 mg/dL (ref 6–23)
CO2: 27 mEq/L (ref 19–32)
Calcium: 8.4 mg/dL (ref 8.4–10.5)
Chloride: 104 mEq/L (ref 96–112)
Creatinine, Ser: 0.95 mg/dL (ref 0.50–1.10)
GFR calc Af Amer: 69 mL/min — ABNORMAL LOW (ref >90)
GFR calc non Af Amer: 60 mL/min — ABNORMAL LOW (ref >90)
Glucose, Bld: 105 mg/dL — ABNORMAL HIGH (ref 70–99)
Potassium: 3.7 mEq/L (ref 3.5–5.1)
Sodium: 138 mEq/L (ref 135–145)
Total Bilirubin: 0.1 mg/dL — ABNORMAL LOW (ref 0.3–1.2)
Total Protein: 6.4 g/dL (ref 6.0–8.3)

## 2013-03-31 LAB — URINE MICROSCOPIC-ADD ON

## 2013-03-31 LAB — GLUCOSE, CAPILLARY: Glucose-Capillary: 94 mg/dL (ref 70–99)

## 2013-03-31 LAB — PREPARE RBC (CROSSMATCH)

## 2013-03-31 LAB — TROPONIN I: Troponin I: <0.3 ng/mL (ref <0.30)

## 2013-03-31 MED ORDER — SODIUM CHLORIDE 0.9 % IV SOLN
INTRAVENOUS | Status: DC
  Administered 2013-04-01 – 2013-04-04 (×4): via INTRAVENOUS

## 2013-03-31 MED ORDER — ONDANSETRON HCL 4 MG/2ML IJ SOLN: 4.0000 mg | Freq: Four times a day (QID) | INTRAMUSCULAR | Status: DC | PRN

## 2013-03-31 MED ORDER — LEVALBUTEROL TARTRATE 45 MCG/ACT IN AERO
2.0000 | INHALATION_SPRAY | Freq: Four times a day (QID) | RESPIRATORY_TRACT | Status: DC | PRN
  Filled 2013-03-31: qty 15

## 2013-03-31 MED ORDER — INSULIN ASPART 100 UNIT/ML ~~LOC~~ SOLN
0.0000 [IU] | Freq: Three times a day (TID) | SUBCUTANEOUS | Status: DC
  Administered 2013-04-01 (×2): 2 [IU] via SUBCUTANEOUS

## 2013-03-31 MED ORDER — ACETAMINOPHEN 650 MG RE SUPP: 650.0000 mg | Freq: Four times a day (QID) | RECTAL | Status: DC | PRN

## 2013-03-31 MED ORDER — ONDANSETRON HCL 4 MG PO TABS: 4.0000 mg | ORAL_TABLET | Freq: Four times a day (QID) | ORAL | Status: DC | PRN

## 2013-03-31 MED ORDER — VITAMIN K1 10 MG/ML IJ SOLN
10.0000 mg | Freq: Once | INTRAMUSCULAR | Status: AC
  Administered 2013-03-31: 10 mg via INTRAMUSCULAR
  Filled 2013-03-31: qty 1

## 2013-03-31 MED ORDER — ALUM & MAG HYDROXIDE-SIMETH 200-200-20 MG/5ML PO SUSP: 30.0000 mL | Freq: Four times a day (QID) | ORAL | Status: DC | PRN

## 2013-03-31 MED ORDER — INSULIN ASPART 100 UNIT/ML ~~LOC~~ SOLN: 0.0000 [IU] | Freq: Every day | SUBCUTANEOUS | Status: DC

## 2013-03-31 MED ORDER — ZOLPIDEM TARTRATE 5 MG PO TABS
5.0000 mg | ORAL_TABLET | Freq: Every evening | ORAL | Status: DC | PRN
  Administered 2013-04-01: 5 mg via ORAL
  Filled 2013-03-31: qty 1

## 2013-03-31 MED ORDER — ACETAMINOPHEN 325 MG PO TABS: 650.0000 mg | ORAL_TABLET | Freq: Four times a day (QID) | ORAL | Status: DC | PRN

## 2013-03-31 NOTE — ED Notes (Addendum)
Pt states that she was at home today when she felt like she was going to "pass out", dizzy, weak all over, denies any pain, states that she felt like her heart was racing, has a similar episode March 09, 2013

## 2013-03-31 NOTE — ED Provider Notes (Addendum)
History    This chart was scribed for [name], MD scribed by Magnus Sinning. The patient was seen in room [facility] at 17:11   CSN: [id number]  Arrival date & time 03/31/13  1654  Chief Complaint  Patient presents with  . Loss of Consciousness    (Consider location/radiation/quality/duration/timing/severity/associated sxs/prior treatment) Patient is a 72 y.o. female presenting with syncope. The history is provided by the patient. No language interpreter was used.  Loss of Consciousness  Associated symptoms include light-headedness and weakness. Pertinent negatives include chest pain, congestion, fever, nausea and vomiting.   [name] is a 72 y.o. female who presents to the Emergency Department complaining of a sudden-onset near syncope, onset one hour ago with associated lightheadedness, dizziness, and intermittent weakness of the legs. She was reportedly at home when she began sudden onset of sxs. The patient says she has been having ongoing similar sxs and explains that she was seen and admitted three weeks ago for same sxs. She says she was informed that she had a irregular heartbeat and reports she is scheduled to see cardiologist in two days, but says she was concerned over today's sxs that she decided not to wait.    At visit three weeks ago, the patient presented with sudden onset and persistence of multiple intermittent episodes of "dizziness" for 2 days. She reportedly had associated nausea, diaphoresis, generalized weakness, and expressed feelings that her legs were "giving out." She described the dizziness at this episode three weeks ago as "everything is moving around." She also had near syncope and fall, but reports her son was able to catch her and prevent complete fall.  The patient was dx with new onset a.fib and admitted. She was discharged 16 days ago and started on diltiazem. Pt is currently at rate controlled, but initial heart rate of 115 was noted.    The  patient has hx of DM, and HTN and states she has continued to have a monthly period, which she states ob-gyn was made aware of about 1-2 years ago. She reports having anemia for almost entire life and explains she's experienced increased vaginal bleeding with start of coumadin. She notes last occurrence was one week ago, and says she was using about four pads a day. Pt also reports passing of many blood clots with period.   PCP: Dr. Felecia Shelling    Past Medical History  Diagnosis Date  . Diabetes mellitus   . Asthma   . Chronic knee pain   . Back pain, chronic   . Hypertension   . Vertigo   . Hyperlipidemia   . TIA (transient ischemic attack)   . Anemia     Past Surgical History  Procedure Laterality Date  . Appendectomy    . Ovary surgery    . Cataract extraction w/phaco Left 01/16/2013    Procedure: CATARACT EXTRACTION PHACO AND INTRAOCULAR LENS PLACEMENT (IOC);  Surgeon: Loraine Leriche T. Nile Riggs, MD;  Location: [facility];  Service: Ophthalmology;  Laterality: Left;  CDE:14.37    Family History  Problem Relation Age of Onset  . Arrhythmia Father     Atrial fib/Pacemaker  . Heart failure Mother   . Diabetes Mother   . Diabetes Father   . Diabetes Sister     History  Substance Use Topics  . Smoking status: Current Every Day Smoker -- 0.25 packs/day  . Smokeless tobacco: Not on file  . Alcohol Use: No    Review of Systems  Constitutional: Negative for fever.  HENT: Negative for congestion.   Eyes: Visual disturbance: The patient states double vision for three weeks.  Respiratory: Negative for cough.   Cardiovascular: Positive for syncope. Negative for chest pain.  Gastrointestinal: Negative for nausea, vomiting and diarrhea.  Neurological: Positive for weakness and light-headedness.  All other systems reviewed and are negative.    Allergies  Review of patient's allergies indicates no known allergies.  Home Medications   Current Outpatient Rx  Name  Route  Sig  Dispense   Refill  . aspirin EC 81 MG tablet   Oral   Take 81 mg by mouth every morning.          . cholecalciferol (VITAMIN D) 1000 UNITS tablet   Oral   Take 1,000 Units by mouth every morning.          . diltiazem (CARDIZEM) 120 MG tablet   Oral   Take 1 tablet (120 mg total) by mouth 4 (four) times daily.   30 tablet   3   . levalbuterol (XOPENEX HFA) 45 MCG/ACT inhaler   Inhalation   Inhale 2 puffs into the lungs every 6 (six) hours as needed for wheezing or shortness of breath.   1 Inhaler   3   . metroNIDAZOLE (FLAGYL) 500 MG tablet   Oral   Take 1 tablet (500 mg total) by mouth every 12 (twelve) hours.   15 tablet   0   . simvastatin (ZOCOR) 20 MG tablet   Oral   Take 20 mg by mouth every morning.          . traMADol-acetaminophen (ULTRACET) 37.5-325 MG per tablet   Oral   Take 1 tablet by mouth 2 (two) times daily as needed (for arthritis pain). Pain         . warfarin (COUMADIN) 5 MG tablet   Oral   Take 1 tablet (5 mg total) by mouth daily.   30 tablet   0     BP 117/69  Pulse 91  Temp(Src) 97.8 F (36.6 C) (Oral)  Resp 22  Ht 5' (1.524 m)  Wt 293 lb (132.904 kg)  BMI 57.22 kg/m2  SpO2 100%  Physical Exam  Nursing note and vitals reviewed. Constitutional: She is oriented to person, place, and time. She appears well-developed and well-nourished.  Morbidly obese   HENT:  Head: Normocephalic and atraumatic.  Right Ear: External ear normal.  Left Ear: External ear normal.  Nose: Nose normal.  Mouth/Throat: Oropharynx is clear and moist.  Eyes: Conjunctivae and EOM are normal. Pupils are equal, round, and reactive to light.  No visual field deficits  Neck: Normal range of motion. Neck supple.  Cardiovascular: Normal rate, regular rhythm, normal heart sounds and intact distal pulses.   Pulmonary/Chest: Effort normal and breath sounds normal.  Abdominal: Soft. Bowel sounds are normal.  Genitourinary:  Patient with  Blood in vaginal vault, unable  to visualize cervix due to patient size , uterus enlarged on bimanual.   Musculoskeletal: Normal range of motion.  Strength nml throughtout.   Neurological: She is alert and oriented to person, place, and time. She has normal reflexes.  Skin: Skin is warm and dry.  Psychiatric: She has a normal mood and affect. Her behavior is normal. Judgment and thought content normal.    ED Course  Procedures (including critical care time) DIAGNOSTIC STUDIES: Oxygen Saturation is 100% on room air, normal by my interpretation.    COORDINATION OF CARE: 17:13: Physical exam performed. 17:15: Intent provided  to check INR and order head ct. Patient is agreeable. 17:23: Orders placed for CBC, CMP, Troponin I, INR, and urinalysis.   Labs Reviewed - No data to display No results found.   No diagnosis found.  Date: 03/31/2013  Rate: 86  Rhythm: normal sinus rhythm  QRS Axis: normal  Intervals:first degree av block  ST/T Wave abnormalities: normal  Conduction Disutrbances:first-degree A-V block   Narrative Interpretation:   Old EKG Reviewed: changes noted   Results for orders placed during the hospital encounter of 03/31/13  CBC WITH DIFFERENTIAL      Result Value Range   WBC 5.4  4.0 - 10.5 K/uL   RBC 2.36 (*) 3.87 - 5.11 MIL/uL   Hemoglobin 7.4 (*) 12.0 - 15.0 g/dL   HCT 96.0 (*) 45.4 - 09.8 %   MCV 96.2  78.0 - 100.0 fL   MCH 31.4  26.0 - 34.0 pg   MCHC 32.6  30.0 - 36.0 g/dL   RDW 11.9 (*) 14.7 - 82.9 %   Platelets 188  150 - 400 K/uL   Neutrophils Relative 52  43 - 77 %   Neutro Abs 2.8  1.7 - 7.7 K/uL   Lymphocytes Relative 38  12 - 46 %   Lymphs Abs 2.0  0.7 - 4.0 K/uL   Monocytes Relative 8  3 - 12 %   Monocytes Absolute 0.4  0.1 - 1.0 K/uL   Eosinophils Relative 2  0 - 5 %   Eosinophils Absolute 0.1  0.0 - 0.7 K/uL   Basophils Relative 0  0 - 1 %   Basophils Absolute 0.0  0.0 - 0.1 K/uL  PROTIME-INR      Result Value Range   Prothrombin Time 17.1 (*) 11.6 - 15.2 seconds    INR 1.43  0.00 - 1.49     MDM  Patient recently evaluated with new onset afib.  Patient rate controlled and started on coumadin. But has continued to feel weak and have near syncopal episodes.  States vaginal bleeding increased through last week.  States seen by gyn and told she was just going through menopause in past year.  Patient now in nsr, hgb low at 7.4 likely cause of near syncope and weakness.  Plan vit K and transfusion.  Discussed with Dr. Ouida Sills and he will evaluate.     Patient being transfused rbc  Dr. Ouida Sills here and care assumed  CRITICAL CARE Performed by: Senya Hinzman S   Total critical care time: 30  Critical care time was exclusive of separately billable procedures and treating other patients.  Critical care was necessary to treat or prevent imminent or life-threatening deterioration.  Critical care was time spent personally by me on the following activities: development of treatment plan with patient and/or surrogate as well as nursing, discussions with consultants, evaluation of patient's response to treatment, examination of patient, obtaining history from patient or surrogate, ordering and performing treatments and interventions, ordering and review of laboratory studies, ordering and review of radiographic studies, pulse oximetry and re-evaluation of patient's condition.    [name], MD 03/31/13 [phone]  I personally performed the services described in this documentation, which was scribed in my presence. The recorded information has been reviewed and considered.   [name], MD 03/31/13 [phone]

## 2013-03-31 NOTE — ED Notes (Signed)
Attempted to call report, nurse in shift report

## 2013-03-31 NOTE — ED Notes (Signed)
Pt unable to void at this time. 

## 2013-04-01 LAB — CBC
HCT: 27.8 % — ABNORMAL LOW (ref 36.0–46.0)
Hemoglobin: 9.2 g/dL — ABNORMAL LOW (ref 12.0–15.0)
MCH: 30.7 pg (ref 26.0–34.0)
MCHC: 33.1 g/dL (ref 30.0–36.0)
MCV: 92.7 fL (ref 78.0–100.0)
Platelets: 181 10*3/uL (ref 150–400)
RBC: 3 MIL/uL — ABNORMAL LOW (ref 3.87–5.11)
RDW: 17.6 % — ABNORMAL HIGH (ref 11.5–15.5)
WBC: 5.7 10*3/uL (ref 4.0–10.5)

## 2013-04-01 LAB — GLUCOSE, CAPILLARY
Glucose-Capillary: 122 mg/dL — ABNORMAL HIGH (ref 70–99)
Glucose-Capillary: 133 mg/dL — ABNORMAL HIGH (ref 70–99)
Glucose-Capillary: 83 mg/dL (ref 70–99)
Glucose-Capillary: 93 mg/dL (ref 70–99)

## 2013-04-01 LAB — PROTIME-INR
INR: 1.32 (ref 0.00–1.49)
Prothrombin Time: 16.1 seconds — ABNORMAL HIGH (ref 11.6–15.2)

## 2013-04-01 MED ORDER — TRAMADOL HCL 50 MG PO TABS
50.0000 mg | ORAL_TABLET | Freq: Four times a day (QID) | ORAL | Status: DC
  Administered 2013-04-01 – 2013-04-04 (×12): 50 mg via ORAL
  Filled 2013-04-01 (×13): qty 1

## 2013-04-01 MED ORDER — TRAZODONE HCL 50 MG PO TABS
50.0000 mg | ORAL_TABLET | Freq: Every evening | ORAL | Status: DC | PRN
  Administered 2013-04-01: 50 mg via ORAL
  Filled 2013-04-01: qty 1

## 2013-04-02 ENCOUNTER — Ambulatory Visit: Payer: Medicare Other | Admitting: Internal Medicine

## 2013-04-02 DIAGNOSIS — N950 Postmenopausal bleeding: Secondary | ICD-10-CM

## 2013-04-02 DIAGNOSIS — R9389 Abnormal findings on diagnostic imaging of other specified body structures: Secondary | ICD-10-CM

## 2013-04-02 DIAGNOSIS — D500 Iron deficiency anemia secondary to blood loss (chronic): Secondary | ICD-10-CM

## 2013-04-02 LAB — CBC
HCT: 24.4 % — ABNORMAL LOW (ref 36.0–46.0)
Hemoglobin: 8.1 g/dL — ABNORMAL LOW (ref 12.0–15.0)
MCH: 30.8 pg (ref 26.0–34.0)
MCHC: 33.2 g/dL (ref 30.0–36.0)
MCV: 92.8 fL (ref 78.0–100.0)
Platelets: 160 10*3/uL (ref 150–400)
RBC: 2.63 MIL/uL — ABNORMAL LOW (ref 3.87–5.11)
RDW: 17.3 % — ABNORMAL HIGH (ref 11.5–15.5)
WBC: 5.3 10*3/uL (ref 4.0–10.5)

## 2013-04-02 LAB — GLUCOSE, CAPILLARY
Glucose-Capillary: 118 mg/dL — ABNORMAL HIGH (ref 70–99)
Glucose-Capillary: 90 mg/dL (ref 70–99)
Glucose-Capillary: 82 mg/dL (ref 70–99)
Glucose-Capillary: 115 mg/dL — ABNORMAL HIGH (ref 70–99)

## 2013-04-02 LAB — URINE CULTURE
Colony Count: NO GROWTH
Culture: NO GROWTH

## 2013-04-02 LAB — PREPARE RBC (CROSSMATCH)

## 2013-04-02 MED ORDER — SODIUM CHLORIDE 0.9 % IV BOLUS (SEPSIS)
250.0000 mL | Freq: Once | INTRAVENOUS | Status: AC
  Administered 2013-04-02: 250 mL via INTRAVENOUS

## 2013-04-02 NOTE — Progress Notes (Signed)
[name], [name]              ACCOUNT NO.:  [id number]  MEDICAL RECORD NO.:  [id number]  LOCATION:  [facility]                         FACILITY:  [facility]  PHYSICIAN:  Kingsley Callander. Ouida Sills, MD       DATE OF BIRTH:  15-Aug-1941  DATE OF PROCEDURE:  04/01/2013 DATE OF DISCHARGE:                                PROGRESS NOTE   HISTORY:  Mrs. Randol is feeling a little stronger.  She has had no recurrent near syncopal symptoms.  She received a transfusion with 2 units of packed red cells without any problems.  She has used 1 pad for her vaginal bleeding overnight.  PHYSICAL EXAMINATION:  GENERAL:  She is alert and comfortable appearing. VITAL SIGNS:  Temperature 98.1, pulse 72, respirations 20, blood pressure 125/50. LUNGS:  Clear. HEART:  Regular with no murmurs. ABDOMEN:  Soft and nontender. EXTREMITIES:  No edema.  IMPRESSION/PLAN: 1. Anemia.  Hemoglobin has improved from 7.4 to 9.2 with 2 units of     packed red cells.  Her INR was 1.43 yesterday.  She was treated     with vitamin K in the emergency room.  Her INR this morning is     1.32.  Coumadin is being held.  White count is 5.7.  Platelet count     is 181,000. 2. Vaginal bleeding.  Consult Gynecology. 3. Atrial fibrillation.  She is in sinus rhythm now.  Coumadin is     being held. 4. Osteoarthritis.  She has complained of some joint pain.  She will     be treated with tramadol as needed.     Kingsley Callander. Ouida Sills, MD     ROF/MEDQ  D:  04/01/2013  T:  04/02/2013  Job:  [id number]

## 2013-04-02 NOTE — Care Management Note (Signed)
    Page 1 of 1   04/04/2013     11:26:54 AM   CARE MANAGEMENT NOTE 04/04/2013  Patient:  [name], [name]   Account Number:  [id number]  Date Initiated:  04/02/2013  Documentation initiated by:  Rosemary Holms  Subjective/Objective Assessment:   Pt admitted from home where she lives with family. Admitted with dizziness and anemic. Having vaginal bleeding. No HH DME needs anticipated. CM will follow     Action/Plan:   Anticipated DC Date:  04/04/2013   Anticipated DC Plan:  HOME/SELF CARE      DC Planning Services  CM consult      Choice offered to / List presented to:             Status of service:  Completed, signed off Medicare Important Message given?   (If response is "NO", the following Medicare IM given date fields will be blank) Date Medicare IM given:   Date Additional Medicare IM given:    Discharge Disposition:  HOME/SELF CARE  Per UR Regulation:    If discussed at Long Length of Stay Meetings, dates discussed:    Comments:  04/04/13 Rosemary Holms RN BSN CM DC'd prior to signing IM.  04/02/13 Rosemary Holms RN BSN CM

## 2013-04-02 NOTE — Consult Note (Addendum)
Reason for Consult:postmenopausal bleeding x years Referring Physician: Ouida Sills, MD  [name] is an 72 y.o. female , a challenging historian, admitted for symptomatic anemia attributable to heavy vaginal bleeding of at least 2 weeks duration, since being placed on Coumadin for the new Dx of atrial fibrillation. Patient gives an unusual history of having never stopped having vaginal bleeding that she assumed was her normal flow. Pt may have had continued postmenopausal bleeding for over a decade.   Pt reports once seeing a [facility] gynecologist once in office "two years ago", unable to recall details or names. Pt has been seen by Dr Felecia Shelling, previously by Dr Parke Simmers. No recall of gyn procedures or biopsies.  Pertinent Gynecological History: Menses: flow is moderate and post-menopausal Bleeding: post menopausal bleeding Contraception: post menopausal status DES exposure: unknown Blood transfusions: 2 units upon admit Sexually transmitted diseases: no past history Previous GYN Procedures:  2007 pelvic u/s showed a 12mm abnormally thickened endometrium, patient did not complete any followup Last mammogram: normal Date: 11/2006 Last pap: unknown Date: ???? OB History: G, P   Menstrual History: Menarche age: 50  Patient's last menstrual period was 03/31/2013.    Past Medical History  Diagnosis Date  . Diabetes mellitus   . Asthma   . Chronic knee pain   . Back pain, chronic   . Hypertension   . Vertigo   . Hyperlipidemia   . TIA (transient ischemic attack)   . Anemia     Past Surgical History  Procedure Laterality Date  . Appendectomy    . Ovary surgery    . Cataract extraction w/phaco Left 01/16/2013    Procedure: CATARACT EXTRACTION PHACO AND INTRAOCULAR LENS PLACEMENT (IOC);  Surgeon: Loraine Leriche T. Nile Riggs, MD;  Location: [facility];  Service: Ophthalmology;  Laterality: Left;  CDE:14.37    Family History  Problem Relation Age of Onset  . Arrhythmia Father     Atrial  fib/Pacemaker  . Heart failure Mother   . Diabetes Mother   . Diabetes Father   . Diabetes Sister     Social History:  reports that she has been smoking.  She does not have any smokeless tobacco history on file. She reports that she does not drink alcohol or use illicit drugs.  Allergies: No Known Allergies  Medications: I have reviewed the patient's current medications.  ROS  Blood pressure 124/79, pulse 68, temperature 98 F (36.7 C), temperature source Oral, resp. rate 18, height 5' (1.524 m), weight 295 lb 6.7 oz (134 kg), last menstrual period 03/31/2013, SpO2 97.00%. Physical Exam BP 147/66  Pulse 73  Temp(Src) 97.5 F (36.4 C) (Oral)  Resp 18  Ht 5' (1.524 m)  Wt 295 lb 6.7 oz (134 kg)  BMI 57.69 kg/m2  SpO2 97%  LMP 03/31/2013 Physical Examination: limited eval tonight General appearance - alert, well appearing, and in no distress, oriented to person, place, and time, overweight, well hydrated and eval hindered by pt obesity Mental status - alert, oriented to person, place, and time, normal mood, behavior, speech, dress, motor activity, and thought processes Mouth - dental hygiene poor Abdomen - soft, nontender, nondistended, no masses or organomegaly    CBC    Component Value Date/Time   WBC 5.3 04/02/2013 0529   RBC 2.63* 04/02/2013 0529   HGB 8.1* 04/02/2013 0529   HCT 24.4* 04/02/2013 0529   PLT 160 04/02/2013 0529   MCV 92.8 04/02/2013 0529   MCH 30.8 04/02/2013 0529   MCHC 33.2 04/02/2013 0529  RDW 17.3* 04/02/2013 0529   LYMPHSABS 2.0 03/31/2013 1723   MONOABS 0.4 03/31/2013 1723   EOSABS 0.1 03/31/2013 1723   BASOSABS 0.0 03/31/2013 1723     Results for orders placed during the hospital encounter of 03/31/13 (from the past 48 hour(s))  GLUCOSE, CAPILLARY     Status: None   Collection Time    03/31/13  9:24 PM      Result Value Range   Glucose-Capillary 94  70 - 99 mg/dL   Comment 1 Notify RN     Comment 2 Documented in Chart    CBC     Status:  Abnormal   Collection Time    04/01/13  5:25 AM      Result Value Range   WBC 5.7  4.0 - 10.5 K/uL   RBC 3.00 (*) 3.87 - 5.11 MIL/uL   Hemoglobin 9.2 (*) 12.0 - 15.0 g/dL   Comment: DELTA CHECK NOTED     POST TRANSFUSION SPECIMEN   HCT 27.8 (*) 36.0 - 46.0 %   MCV 92.7  78.0 - 100.0 fL   MCH 30.7  26.0 - 34.0 pg   MCHC 33.1  30.0 - 36.0 g/dL   RDW 16.1 (*) 09.6 - 04.5 %   Platelets 181  150 - 400 K/uL  PROTIME-INR     Status: Abnormal   Collection Time    04/01/13  5:25 AM      Result Value Range   Prothrombin Time 16.1 (*) 11.6 - 15.2 seconds   INR 1.32  0.00 - 1.49  GLUCOSE, CAPILLARY     Status: Abnormal   Collection Time    04/01/13  7:19 AM      Result Value Range   Glucose-Capillary 122 (*) 70 - 99 mg/dL   Comment 1 Documented in Chart     Comment 2 Notify RN    GLUCOSE, CAPILLARY     Status: Abnormal   Collection Time    04/01/13 11:22 AM      Result Value Range   Glucose-Capillary 133 (*) 70 - 99 mg/dL   Comment 1 Documented in Chart     Comment 2 Notify RN    GLUCOSE, CAPILLARY     Status: None   Collection Time    04/01/13  4:28 PM      Result Value Range   Glucose-Capillary 83  70 - 99 mg/dL   Comment 1 Documented in Chart     Comment 2 Notify RN    GLUCOSE, CAPILLARY     Status: None   Collection Time    04/01/13  9:49 PM      Result Value Range   Glucose-Capillary 93  70 - 99 mg/dL  CBC     Status: Abnormal   Collection Time    04/02/13  5:29 AM      Result Value Range   WBC 5.3  4.0 - 10.5 K/uL   RBC 2.63 (*) 3.87 - 5.11 MIL/uL   Hemoglobin 8.1 (*) 12.0 - 15.0 g/dL   HCT 40.9 (*) 81.1 - 91.4 %   MCV 92.8  78.0 - 100.0 fL   MCH 30.8  26.0 - 34.0 pg   MCHC 33.2  30.0 - 36.0 g/dL   RDW 78.2 (*) 95.6 - 21.3 %   Platelets 160  150 - 400 K/uL  GLUCOSE, CAPILLARY     Status: Abnormal   Collection Time    04/02/13  7:46 AM      Result  Value Range   Glucose-Capillary 118 (*) 70 - 99 mg/dL   Comment 1 Notify RN    GLUCOSE, CAPILLARY     Status:  None   Collection Time    04/02/13 11:48 AM      Result Value Range   Glucose-Capillary 90  70 - 99 mg/dL  GLUCOSE, CAPILLARY     Status: None   Collection Time    04/02/13  5:09 PM      Result Value Range   Glucose-Capillary 82  70 - 99 mg/dL    No results found.  Assessment/Plan: Postmenopausal bleeding  Heavy, with secondary anemia. Moribid obesity. Poor gyn history Hx Atrial Fib, currently on Lovenox  [name] V 04/02/2013

## 2013-04-02 NOTE — Progress Notes (Signed)
Subjective:  Patient was admitted due to nearly syncope. Patient had severe anemia. She had vaginal bleeding. Patient was on coumadin but currently off coumadin.  Objective: Vital signs in last 24 hours: Temp:  [97.8 F (36.6 C)-97.9 F (36.6 C)] 97.8 F (36.6 C) (04/28 0500) Pulse Rate:  [75-77] 77 (04/28 0700) Resp:  [18-20] 18 (04/28 0700) BP: (115-120)/(50-56) 115/56 mmHg (04/28 0500) SpO2:  [95 %-98 %] 96 % (04/28 0700) Weight change:  Last BM Date: 03/31/13  Intake/Output from previous day: 04/27 0701 - 04/28 0700 In: 1883.3 [P.O.:720; I.V.:1163.3] Out: -   PHYSICAL EXAM General appearance: alert and no distress Resp: clear to auscultation bilaterally Cardio: S1, S2 normal GI: soft, non-tender; bowel sounds normal; no masses,  no organomegaly Extremities: extremities normal, atraumatic, no cyanosis or edema  Lab Results:    @labtest @ [facility] No results found for this basename: PHART, PCO2, PO2ART, TCO2, HCO3,  in the last 72 hours CULTURES No results found for this or any previous visit (from the past 240 hour(s)). Studies/Results: Ct Head Wo Contrast  03/31/2013  *RADIOLOGY REPORT*  Clinical Data: Weakness.  Altered level of consciousness.  CT HEAD WITHOUT CONTRAST  Technique:  Contiguous axial images were obtained from the base of the skull through the vertex without contrast.  Comparison: 03/09/2013.  Findings: Patchy and confluent areas of decreased attenuation are again seen scattered throughout the deep and periventricular white matter of the cerebral hemispheres bilaterally, most compatible with chronic microvascular ischemic disease. No acute intracranial abnormalities.  Specifically, no evidence of acute intracranial hemorrhage, no definite findings of acute/subacute cerebral ischemia, no mass, mass effect, hydrocephalus or abnormal intra or extra-axial fluid collections. The visualized paranasal sinuses and mastoids are generally well pneumatized, with exception  of a small right mastoid effusion, and multifocal areas of mucosal thickening throughout the ethmoid sinuses bilaterally and the right frontal sinus.  No acute displaced skull fractures are identified.  IMPRESSION: 1.  No acute intracranial abnormalities. 2.  Extensive chronic microvascular ischemic changes in the cerebral white matter redemonstrated, as above. 3.  Multifocal mucosal thickening throughout the ethmoid sinuses bilaterally and the right frontal sinus. 4.  Small right mastoid effusion.   Original Report Authenticated By: Trudie Reed, M.D.    Dg Chest Port 1 View  03/31/2013  *RADIOLOGY REPORT*  Clinical Data: Loss of consciousness, history of diabetes  PORTABLE CHEST - 1 VIEW  Comparison: 03/10/1939  Findings: Fine detail is obscured by patient body habitus.  Cardiac leads obscure detail.  Moderate enlargement of the cardiomediastinal silhouette with central vascular congestion again noted.  Probable prominence of the superior vascular pedicle of the right lung apex is stable.  No new focal pulmonary opacity.  No pleural effusion.  No acute osseous finding.  IMPRESSION: Cardiomegaly without focal acute finding.   Original Report Authenticated By: Christiana Pellant, M.D.     Medications: I have reviewed the patient's current medications.  Assesment: 1. Vaginal bleeding 2. Anemia secondary to the above 3. UTI 4.Obesity  Active Problems:   * No active hospital problems. *    Plan: Will transfuse 2 units Will monitor CBC Gynecology consult.    LOS: 2 days   Artemis Koller 04/02/2013, 8:05 AM

## 2013-04-02 NOTE — H&P (Signed)
NAMEJANUARY, BERGTHOLD              ACCOUNT NO.:  [id number]  MEDICAL RECORD NO.:  [id number]  LOCATION:  [facility]                         FACILITY:  [facility]  PHYSICIAN:  Kingsley Callander. Ouida Sills, MD       DATE OF BIRTH:  08-May-1941  DATE OF ADMISSION:  03/31/2013 DATE OF DISCHARGE:  LH                             HISTORY & PHYSICAL   CHIEF COMPLAINT:  Nearly passed out.  HISTORY OF PRESENT ILLNESS:  This patient is a 72 year old African American female, patient of Dr. Letitia Neri, who presented to the emergency room after nearly passing out at home.  She had been very lightheaded. She had been experiencing significant vaginal bleeding, requiring 4-5 pads per day.  She had been started on Coumadin a couple of weeks ago after she had been hospitalized for new onset AFib.  On presentation to the emergency room, she was found to be in sinus rhythm.  She was also found to be markedly anemic with a hemoglobin of 7.  She was not hypotensive.  PAST MEDICAL HISTORY: 1. Atrial fibrillation 2. Diabetes. 3. Asthma. 4. Hypertension. 5. Hyperlipidemia. 6. Vaginal bleeding with a workup last year in [place] she states.  MEDICATIONS: 1. Aspirin 81 mg daily. 2. Coumadin 5 mg daily. 3. Vitamin D 1000 units daily. 4. Diltiazem 120 mg q.i.d. 5. Xopenex 2 puffs q.6 h. p.r.n. 6. Simvastatin 20 mg daily as listed, but she states she is not taking     it.  ALLERGIES:  NONE.  SOCIAL HISTORY:  She does not use tobacco, alcohol, or drug.  FAMILY HISTORY:  Her mother had diabetes and congestive heart failure. Her father had diabetes.  Her sister has diabetes.  REVIEW OF SYSTEMS:  No fever, vomiting, change in bowel habits, rectal bleeding, melena, or difficulty voiding.  She does not have pelvic pain.  PHYSICAL EXAMINATION:  VITAL SIGNS:  Temperature 98.1, pulse 72, respirations 20, and blood pressure 125/50. GENERAL:  Alert and in no distress. HEENT:  Mucous membranes are pale.  No scleral icterus. NECK:  No  JVD or thyromegaly. LUNGS:  Clear. HEART:  Regular with no murmurs. ABDOMEN:  Soft and nontender with no palpable organomegaly.  She is overweight. GU:  A pelvic exam was attempted by the ER physician, however, the cervix and cervical os could not be identified.  There was blood in the vagina by report. EXTREMITIES:  Pulses intact.  No clubbing or edema. NEURO:  No focal weakness. LYMPH NODES:  No cervical or supraclavicular enlargement. SKIN:  Warm and dry.  LABORATORY DATA:  Sodium 138, potassium 3.7, bicarb 27, BUN 20, creatinine 0.95, calcium 8.4, glucose 105, and albumin 2.8.  Troponin I less than 0.30.  White count 5.4, hemoglobin 7.4 with an MCV of 96, platelets 188,000.  INR 1.43.  Urinalysis reveals 7-10 white cells, 7-10 red cells, and 100 mg/dL of protein.  CT scan of the brain revealed no acute abnormality.  She did have chronic white matter changes.  Chest x- ray reveals cardiomegaly with no acute infiltrate.  EKG reveals normal sinus rhythm with first degree AV block at 86 beats per minute.  IMPRESSION/PLAN: 1. Anemia with near syncope.  She will require hospitalization  and     transfusion with 2 units of packed red cells.  Coumadin will be     stopped. 2. Vaginal bleeding.  Consult Gynecology. 3. Paroxysmal atrial fibrillation.  She is now in sinus rhythm.     Coumadin will be stopped.  She tells me that she is not taking the     diltiazem which had been prescribed q.i.d.  This will be held for     now. 4. Hypoalbuminemia. 5. Possible urinary tract infection.  Urine culture has been ordered.  She was administered vitamin K while in the emergency room.     Kingsley Callander. Ouida Sills, MD     ROF/MEDQ  D:  04/01/2013  T:  04/02/2013  Job:  [id number]  cc:   Ninetta Lights D. Felecia Shelling, MD Fax: [phone]

## 2013-04-03 ENCOUNTER — Inpatient Hospital Stay (HOSPITAL_COMMUNITY): Payer: Medicare Other

## 2013-04-03 ENCOUNTER — Encounter (HOSPITAL_COMMUNITY): Payer: Self-pay | Admitting: Obstetrics and Gynecology

## 2013-04-03 DIAGNOSIS — N8501 Benign endometrial hyperplasia: Secondary | ICD-10-CM

## 2013-04-03 HISTORY — PX: ENDOMETRIAL BIOPSY: PRO73

## 2013-04-03 LAB — TYPE AND SCREEN
ABO/RH(D): AB POS
Antibody Screen: NEGATIVE
Unit division: 0
Unit division: 0
Unit division: 0
Unit division: 0
Unit division: 0

## 2013-04-03 LAB — CBC
HCT: 31.2 % — ABNORMAL LOW (ref 36.0–46.0)
Hemoglobin: 10.5 g/dL — ABNORMAL LOW (ref 12.0–15.0)
MCH: 30.3 pg (ref 26.0–34.0)
MCHC: 33.7 g/dL (ref 30.0–36.0)
MCV: 90.2 fL (ref 78.0–100.0)
Platelets: 168 10*3/uL (ref 150–400)
RBC: 3.46 MIL/uL — ABNORMAL LOW (ref 3.87–5.11)
RDW: 17.2 % — ABNORMAL HIGH (ref 11.5–15.5)
WBC: 5.8 10*3/uL (ref 4.0–10.5)

## 2013-04-03 LAB — GLUCOSE, CAPILLARY
Glucose-Capillary: 100 mg/dL — ABNORMAL HIGH (ref 70–99)
Glucose-Capillary: 94 mg/dL (ref 70–99)
Glucose-Capillary: 109 mg/dL — ABNORMAL HIGH (ref 70–99)
Glucose-Capillary: 96 mg/dL (ref 70–99)

## 2013-04-03 MED ORDER — MEGESTROL ACETATE 40 MG PO TABS
40.0000 mg | ORAL_TABLET | Freq: Three times a day (TID) | ORAL | Status: DC
  Administered 2013-04-03 (×2): 40 mg via ORAL
  Filled 2013-04-03 (×3): qty 1

## 2013-04-03 NOTE — Progress Notes (Signed)
Subjective:  Patient feels better. She was transfused two additional units yesterday. He vaginal bleeding is stopping.  Objective: Vital signs in last 24 hours: Temp:  [97.5 F (36.4 C)-98.8 F (37.1 C)] 97.6 F (36.4 C) (04/29 0457) Pulse Rate:  [66-88] 88 (04/29 0457) Resp:  [17-20] 20 (04/29 0457) BP: (116-170)/(60-92) 145/85 mmHg (04/29 0457) SpO2:  [97 %] 97 % (04/29 0717) Weight:  [134 kg (295 lb 6.7 oz)] 134 kg (295 lb 6.7 oz) (04/29 0457) Weight change:  Last BM Date: 04/02/13  Intake/Output from previous day: 04/28 0701 - 04/29 0700 In: 3263.3 [P.O.:960; I.V.:1917.5; Blood:385.8] Out: -   PHYSICAL EXAM General appearance: alert and no distress Resp: clear to auscultation bilaterally Cardio: S1, S2 normal GI: soft, non-tender; bowel sounds normal; no masses,  no organomegaly Extremities: extremities normal, atraumatic, no cyanosis or edema  Lab Results:    @labtest @ [facility] No results found for this basename: PHART, PCO2, PO2ART, TCO2, HCO3,  in the last 72 hours CULTURES Recent Results (from the past 240 hour(s))  URINE CULTURE     Status: None   Collection Time    03/31/13  6:59 PM      Result Value Range Status   Specimen Description URINE, CLEAN CATCH   Final   Special Requests NONE   Final   Culture  Setup Time 04/01/2013 21:32   Final   Colony Count NO GROWTH   Final   Culture NO GROWTH   Final   Report Status 04/02/2013 FINAL   Final   Studies/Results: No results found.  Medications: I have reviewed the patient's current medications.  Assesment: 1. Vaginal bleeding 2. Anemia secondary to the above 3. UTI 4.Obesity  Active Problems:   * No active hospital problems. *    Plan: Will monitor CBC Gynecology consult appreciated Continue regular treatment.    LOS: 3 days   Roselani Grajeda 04/03/2013, 7:57 AM

## 2013-04-03 NOTE — Progress Notes (Signed)
RN talked to Dr. Emelda Fear about the patient's plan of care. He stated she would have an [place] of her abdomen in the morning and then she would go down to the ED for an endometrial biopsy due to her vaginal bleeding by Dr. Emelda Fear. Patient is aware of the procedure and consent has been signed.

## 2013-04-03 NOTE — Progress Notes (Signed)
Subjective: Patient reports continued  Small clots, but bleeding described as significantly lighter since admission and discontinuation of anticoagulant.    Objective: I have reviewed patient's vital signs and labs.  General: alert, cooperative and morbidly obese GI: soft, non-tender; bowel sounds normal; no masses,  no organomegaly and Morbidly obesity limits exam Vaginal Bleeding: minimal Endometrial biopsy performed in emergency room exam, see procedure note regarding this biopsy  Assessment/Plan: 1: Postmenopausal bleeding of at least 10 years duration, improving.     If patient's atrial fibrillation has resolved since that she does not require further anticoagulation, the bleeding will likely stay controllable . Will place patient on Megace 40 mg 3 times a day while awaiting results 2: Anemia resolved status post transfusions 3 morbid obesity Plan: Depending on the findings of the biopsy report patient may require for a D&C, or hysterectomy or medical therapy such as IUD placement. This workup can be completed as an outpatient. Will discuss this with Dr. Felecia Shelling  LOS: 3 days    Tilda Burrow 04/03/2013, 1:21 PM

## 2013-04-03 NOTE — Procedures (Addendum)
Patient was taken to the emergency room 22 for procedure, placed on GYN exam bed,, and timeout conducted with nurse in attendance and assistant large bivalve speculum was placed in the vagina, cervix grasped with single-tooth tenaculum, the uterus sounded in the anteflexed position to 7 cm, and endometrial biopsy performed using Milex 3 mm endometrial biopsy device obtaining blood clots and tissue fragments. Specimen sent to laboratory. Patient returned to room. Tissue report should return tomorrow. Currently the patient's bleeding is under adequate control. She may be placed on Megace 40 mg 3 times a day which I will order. Remainder of endometrial biopsy evaluation to be conducted on outpatient basis

## 2013-04-04 DIAGNOSIS — D500 Iron deficiency anemia secondary to blood loss (chronic): Secondary | ICD-10-CM

## 2013-04-04 DIAGNOSIS — N950 Postmenopausal bleeding: Secondary | ICD-10-CM

## 2013-04-04 DIAGNOSIS — N8501 Benign endometrial hyperplasia: Secondary | ICD-10-CM

## 2013-04-04 LAB — CBC
HCT: 29.3 % — ABNORMAL LOW (ref 36.0–46.0)
Hemoglobin: 9.6 g/dL — ABNORMAL LOW (ref 12.0–15.0)
MCH: 29.8 pg (ref 26.0–34.0)
MCHC: 32.8 g/dL (ref 30.0–36.0)
MCV: 91 fL (ref 78.0–100.0)
Platelets: 165 10*3/uL (ref 150–400)
RBC: 3.22 MIL/uL — ABNORMAL LOW (ref 3.87–5.11)
RDW: 16.8 % — ABNORMAL HIGH (ref 11.5–15.5)
WBC: 5.7 10*3/uL (ref 4.0–10.5)

## 2013-04-04 LAB — GLUCOSE, CAPILLARY: Glucose-Capillary: 88 mg/dL (ref 70–99)

## 2013-04-04 MED ORDER — MEGESTROL ACETATE 40 MG PO TABS: 40.0000 mg | ORAL_TABLET | Freq: Every day | ORAL | Status: DC

## 2013-04-04 NOTE — Progress Notes (Signed)
Subjective: Patient reports bleeding has essentially stopped. Results due within next 48 hours.    Objective: I have reviewed patient's vital signs, medications and labs. CBC    Component Value Date/Time   WBC 5.7 04/04/2013 0558   RBC 3.22* 04/04/2013 0558   HGB 9.6* 04/04/2013 0558   HCT 29.3* 04/04/2013 0558   PLT 165 04/04/2013 0558   MCV 91.0 04/04/2013 0558   MCH 29.8 04/04/2013 0558   MCHC 32.8 04/04/2013 0558   RDW 16.8* 04/04/2013 0558   LYMPHSABS 2.0 03/31/2013 1723   MONOABS 0.4 03/31/2013 1723   EOSABS 0.1 03/31/2013 1723   BASOSABS 0.0 03/31/2013 1723     General: alert, cooperative, morbidly obese and ambulatory Vaginal Bleeding: minimal   Assessment/Plan: Postmenopausal bleeding x 10+ yrs, awaiting biopsy results Anemia, controlled  Plan: Continue megace as outpatient , Megace 40 mg tid x 5 days then once daily til follow up [facility] MY normal Rx is Megace 20 mg tabs, 2 tabs tid x 5 days then 2 tabs daily , disp 90. Followup needed 2weeks at [facility].   LOS: 4 days    Gerson Fauth V 04/04/2013, 8:09 AM

## 2013-04-04 NOTE — Progress Notes (Signed)
Pt and her family verbalize understanding of d/c instructions, follow up information and medications. IV d/c. No questions at this time. Pt will follow up with Dr. Emelda Fear in 2 weeks, appt made with his office. Pt d/c via wheelchair, accompanied by myself and her family members. Sheryn Bison

## 2013-04-04 NOTE — Discharge Summary (Signed)
Physician Discharge Summary  Patient ID: [name] MRN: [id number] DOB/AGE: Jun 07, 1941 72 y.o. Primary Care Physician:Emory Gallentine, MD Admit date: 03/31/2013 Discharge date: 04/04/2013    Discharge Diagnoses:  1. Vaginal bleeding  2. Anemia secondary to the above  3. UTI  4.Obesity  4. DM type II 5. Hypertension 6. PAF  Active Problems:   * No active hospital problems. *     Medication List    STOP taking these medications       warfarin 5 MG tablet  Commonly known as:  COUMADIN      TAKE these medications       aspirin EC 81 MG tablet  Take 81 mg by mouth every morning.     cholecalciferol 1000 UNITS tablet  Commonly known as:  VITAMIN D  Take 1,000 Units by mouth every morning.     diltiazem 120 MG tablet  Commonly known as:  CARDIZEM  Take 1 tablet (120 mg total) by mouth 4 (four) times daily.     levalbuterol 45 MCG/ACT inhaler  Commonly known as:  XOPENEX HFA  Inhale 2 puffs into the lungs every 6 (six) hours as needed for wheezing or shortness of breath.     metroNIDAZOLE 500 MG tablet  Commonly known as:  FLAGYL  Take 1 tablet (500 mg total) by mouth every 12 (twelve) hours.     simvastatin 20 MG tablet  Commonly known as:  ZOCOR  Take 20 mg by mouth at bedtime.        Discharged Condition:  Improved    Consults: GYN  Significant Diagnostic Studies: Dg Chest 1 View  03/09/2013  *RADIOLOGY REPORT*  Clinical Data: History of weakness, diabetes, asthma, and hypertension.  CHEST - 1 VIEW  Comparison: 12/15/2011.  Findings: There is moderate enlargement cardiac silhouette accentuated by AP magnification.  No pulmonary edema, pneumonia, or pleural effusion is seen.  No skeletal lesions evident.  There is obesity.  IMPRESSION: Enlargement of the cardiac silhouette with no evidence of pulmonary edema or pleural effusion.  Obesity.   Original Report Authenticated By: Onalee Hua Call    Ct Head Wo Contrast  03/31/2013  *RADIOLOGY REPORT*  Clinical  Data: Weakness.  Altered level of consciousness.  CT HEAD WITHOUT CONTRAST  Technique:  Contiguous axial images were obtained from the base of the skull through the vertex without contrast.  Comparison: 03/09/2013.  Findings: Patchy and confluent areas of decreased attenuation are again seen scattered throughout the deep and periventricular white matter of the cerebral hemispheres bilaterally, most compatible with chronic microvascular ischemic disease. No acute intracranial abnormalities.  Specifically, no evidence of acute intracranial hemorrhage, no definite findings of acute/subacute cerebral ischemia, no mass, mass effect, hydrocephalus or abnormal intra or extra-axial fluid collections. The visualized paranasal sinuses and mastoids are generally well pneumatized, with exception of a small right mastoid effusion, and multifocal areas of mucosal thickening throughout the ethmoid sinuses bilaterally and the right frontal sinus.  No acute displaced skull fractures are identified.  IMPRESSION: 1.  No acute intracranial abnormalities. 2.  Extensive chronic microvascular ischemic changes in the cerebral white matter redemonstrated, as above. 3.  Multifocal mucosal thickening throughout the ethmoid sinuses bilaterally and the right frontal sinus. 4.  Small right mastoid effusion.   Original Report Authenticated By: Trudie Reed, M.D.    Ct Head Wo Contrast  03/09/2013  *RADIOLOGY REPORT*  Clinical Data: Dizziness.  Weakness in both legs.  CT HEAD WITHOUT CONTRAST  Technique:  Contiguous axial images were obtained  from the base of the skull through the vertex without contrast.  Comparison: 07/16/2011  Findings: The brain stem, cerebellum, cerebral peduncles, thalami, basal ganglia, basilar cisterns, and ventricular system appear unremarkable.  Periventricular and corona radiata white matter hypodensities are most compatible with chronic ischemic microvascular white matter disease.  No intracranial hemorrhage, mass  lesion, or acute infarction is identified.  Chronic ethmoid sinusitis observed.  Small right mastoid effusion.  IMPRESSION:  1. Periventricular and corona radiata white matter hypodensities are most compatible with chronic ischemic microvascular white matter disease. 2.  Chronic ethmoid sinusitis with small right mastoid effusion.   Original Report Authenticated By: Gaylyn Rong, M.D.    US Transvaginal Non-ob  04/03/2013  *RADIOLOGY REPORT*  Clinical Data: Postmenopausal vaginal bleeding, prior right oophorectomy.  TRANSABDOMINAL AND TRANSVAGINAL ULTRASOUND OF PELVIS  Technique:  Both transabdominal and transvaginal ultrasound examinations of the pelvis were performed.  Transabdominal technique was performed for global imaging of the pelvis including uterus, ovaries, adnexal regions, and pelvic cul-de-sac.  It was necessary to proceed with endovaginal exam following the transabdominal exam to visualize the endometrium.  Comparison:  11/08/2006  Findings: Uterus:  Anteverted, anteflexed.  9.3 x 5.0 x 4.3 cm.  Left lateral uterine body intramural fibroid measures 2.6 x 2.4 x 1.6 cm.  Endometrium: 1.6 cm, uniformly inhomogeneous without measurable focal mass lesion.  Right ovary: Not visualized, presumed surgically absent  Left ovary: 3.0 x 1.6 x 1.4 cm.  Normal.  Other Findings:  Small amount of free fluid noted in the cul-de- sac.  IMPRESSION: Persistently thickened endometrium which, in a post menopausal patient, raises the question of hyperplasia, neoplasia, or less likely underlying lesion such as polyp or fibroid. In the setting of post-menopausal bleeding, endometrial sampling is indicated to exclude carcinoma.  If results are benign, sonohysterogram should be considered for focal lesion work-up. (Ref:  Radiological Reasoning: Algorithmic Workup of Abnormal Vaginal Bleeding with Endovaginal Sonography and Sonohysterography. AJR 2008; 409:W11-91)   Original Report Authenticated By: Christiana Pellant, M.D.     US Pelvis Complete  04/03/2013  *RADIOLOGY REPORT*  Clinical Data: Postmenopausal vaginal bleeding, prior right oophorectomy.  TRANSABDOMINAL AND TRANSVAGINAL ULTRASOUND OF PELVIS  Technique:  Both transabdominal and transvaginal ultrasound examinations of the pelvis were performed.  Transabdominal technique was performed for global imaging of the pelvis including uterus, ovaries, adnexal regions, and pelvic cul-de-sac.  It was necessary to proceed with endovaginal exam following the transabdominal exam to visualize the endometrium.  Comparison:  11/08/2006  Findings: Uterus:  Anteverted, anteflexed.  9.3 x 5.0 x 4.3 cm.  Left lateral uterine body intramural fibroid measures 2.6 x 2.4 x 1.6 cm.  Endometrium: 1.6 cm, uniformly inhomogeneous without measurable focal mass lesion.  Right ovary: Not visualized, presumed surgically absent  Left ovary: 3.0 x 1.6 x 1.4 cm.  Normal.  Other Findings:  Small amount of free fluid noted in the cul-de- sac.  IMPRESSION: Persistently thickened endometrium which, in a post menopausal patient, raises the question of hyperplasia, neoplasia, or less likely underlying lesion such as polyp or fibroid. In the setting of post-menopausal bleeding, endometrial sampling is indicated to exclude carcinoma.  If results are benign, sonohysterogram should be considered for focal lesion work-up. (Ref:  Radiological Reasoning: Algorithmic Workup of Abnormal Vaginal Bleeding with Endovaginal Sonography and Sonohysterography. AJR 2008; 478:G95-62)   Original Report Authenticated By: Christiana Pellant, M.D.    Dg Chest Port 1 View  03/31/2013  *RADIOLOGY REPORT*  Clinical Data: Loss of consciousness, history of diabetes  PORTABLE CHEST - 1 VIEW  Comparison: 03/10/1939  Findings: Fine detail is obscured by patient body habitus.  Cardiac leads obscure detail.  Moderate enlargement of the cardiomediastinal silhouette with central vascular congestion again noted.  Probable prominence of the superior  vascular pedicle of the right lung apex is stable.  No new focal pulmonary opacity.  No pleural effusion.  No acute osseous finding.  IMPRESSION: Cardiomegaly without focal acute finding.   Original Report Authenticated By: Christiana Pellant, M.D.     Lab Results: Basic Metabolic Panel: No results found for this basename: NA, K, CL, CO2, GLUCOSE, BUN, CREATININE, CALCIUM, MG, PHOS,  in the last 72 hours Liver Function Tests: No results found for this basename: AST, ALT, ALKPHOS, BILITOT, PROT, ALBUMIN,  in the last 72 hours   CBC:  Recent Labs  04/03/13 0851 04/04/13 0558  WBC 5.8 5.7  HGB 10.5* 9.6*  HCT 31.2* 29.3*  MCV 90.2 91.0  PLT 168 165    Recent Results (from the past 240 hour(s))  URINE CULTURE     Status: None   Collection Time    03/31/13  6:59 PM      Result Value Range Status   Specimen Description URINE, CLEAN CATCH   Final   Special Requests NONE   Final   Culture  Setup Time 04/01/2013 21:32   Final   Colony Count NO GROWTH   Final   Culture NO GROWTH   Final   Report Status 04/02/2013 FINAL   Final     Hospital Course:  This is a 72 years old female patient with history of multiple medical illnesses was admitted due to near syncope. She had vaginal bleeding and severe anemia. Patient was admitted and her coumadin was discontinued. Patient received a total of 4 units of PRBC. She was seen by GYN and biopsy was done. Her bleeding has stopped. Patient will be discharged and followed by GYN in out patient.  Discharge Exam: Blood pressure 131/47, pulse 84, temperature 98.1 F (36.7 C), temperature source Oral, resp. rate 19, height 5' (1.524 m), weight 133 kg (293 lb 3.4 oz), last menstrual period 03/31/2013, SpO2 99.00%.    Disposition:  Home        Follow-up Information   Follow up with [facility], [place].   Contact information:   [address] [place] [address] [phone]       Signed: Tvisha Schwoerer  04/04/2013, 8:11 AM

## 2013-04-06 ENCOUNTER — Telehealth: Payer: Self-pay | Admitting: Obstetrics and Gynecology

## 2013-04-06 NOTE — Telephone Encounter (Signed)
Left message regarding Endometrial Bx results.  Pt will need full Hysteroscopy, D&C,  Will perform preop at scheduled visit 15 May.Marland Kitchen

## 2013-04-09 ENCOUNTER — Encounter (HOSPITAL_COMMUNITY): Payer: Self-pay | Admitting: Pharmacy Technician

## 2013-04-18 ENCOUNTER — Encounter: Payer: Self-pay | Admitting: *Deleted

## 2013-04-19 ENCOUNTER — Other Ambulatory Visit (HOSPITAL_COMMUNITY)
Admission: RE | Admit: 2013-04-19 | Discharge: 2013-04-19 | Disposition: A | Discharge status: Home / Self Care | Payer: Medicare Other | Source: Ambulatory Visit | Attending: Obstetrics and Gynecology | Admitting: Obstetrics and Gynecology

## 2013-04-19 ENCOUNTER — Ambulatory Visit (INDEPENDENT_AMBULATORY_CARE_PROVIDER_SITE_OTHER): Payer: Medicare Other | Admitting: Obstetrics and Gynecology

## 2013-04-19 ENCOUNTER — Encounter (HOSPITAL_COMMUNITY): Payer: Self-pay

## 2013-04-19 ENCOUNTER — Encounter (HOSPITAL_COMMUNITY)
Admission: RE | Admit: 2013-04-19 | Discharge: 2013-04-19 | Disposition: A | Discharge status: Home / Self Care | Payer: Medicare Other | Source: Ambulatory Visit | Attending: Obstetrics and Gynecology | Admitting: Obstetrics and Gynecology

## 2013-04-19 ENCOUNTER — Encounter: Payer: Self-pay | Admitting: Obstetrics and Gynecology

## 2013-04-19 ENCOUNTER — Other Ambulatory Visit: Payer: Self-pay

## 2013-04-19 VITALS — BP 172/90 | Ht 60.0 in | Wt 294.8 lb

## 2013-04-19 DIAGNOSIS — I10 Essential (primary) hypertension: Secondary | ICD-10-CM | POA: Insufficient documentation

## 2013-04-19 DIAGNOSIS — Z1151 Encounter for screening for human papillomavirus (HPV): Secondary | ICD-10-CM | POA: Insufficient documentation

## 2013-04-19 DIAGNOSIS — Z01818 Encounter for other preprocedural examination: Secondary | ICD-10-CM

## 2013-04-19 DIAGNOSIS — N950 Postmenopausal bleeding: Secondary | ICD-10-CM

## 2013-04-19 DIAGNOSIS — Z01812 Encounter for preprocedural laboratory examination: Secondary | ICD-10-CM | POA: Insufficient documentation

## 2013-04-19 DIAGNOSIS — Z01810 Encounter for preprocedural cardiovascular examination: Secondary | ICD-10-CM | POA: Insufficient documentation

## 2013-04-19 DIAGNOSIS — Z124 Encounter for screening for malignant neoplasm of cervix: Secondary | ICD-10-CM

## 2013-04-19 DIAGNOSIS — Z01419 Encounter for gynecological examination (general) (routine) without abnormal findings: Secondary | ICD-10-CM | POA: Insufficient documentation

## 2013-04-19 HISTORY — DX: Unspecified osteoarthritis, unspecified site: M19.90

## 2013-04-19 HISTORY — DX: Unspecified hearing loss, unspecified ear: H91.90

## 2013-04-19 LAB — CBC
HCT: 34.6 % — ABNORMAL LOW (ref 36.0–46.0)
Hemoglobin: 11.3 g/dL — ABNORMAL LOW (ref 12.0–15.0)
MCH: 30.7 pg (ref 26.0–34.0)
MCHC: 32.7 g/dL (ref 30.0–36.0)
MCV: 94 fL (ref 78.0–100.0)
Platelets: 272 10*3/uL (ref 150–400)
RBC: 3.68 MIL/uL — ABNORMAL LOW (ref 3.87–5.11)
RDW: 16.1 % — ABNORMAL HIGH (ref 11.5–15.5)
WBC: 7 10*3/uL (ref 4.0–10.5)

## 2013-04-19 LAB — SURGICAL PCR SCREEN
MRSA, PCR: NEGATIVE
Staphylococcus aureus: NEGATIVE

## 2013-04-19 LAB — COMPREHENSIVE METABOLIC PANEL
ALT: 11 U/L (ref 0–35)
AST: 14 U/L (ref 0–37)
Albumin: 3.2 g/dL — ABNORMAL LOW (ref 3.5–5.2)
Alkaline Phosphatase: 70 U/L (ref 39–117)
BUN: 14 mg/dL (ref 6–23)
CO2: 26 mEq/L (ref 19–32)
Calcium: 9 mg/dL (ref 8.4–10.5)
Chloride: 105 mEq/L (ref 96–112)
Creatinine, Ser: 0.9 mg/dL (ref 0.50–1.10)
GFR calc Af Amer: 73 mL/min — ABNORMAL LOW (ref >90)
GFR calc non Af Amer: 63 mL/min — ABNORMAL LOW (ref >90)
Glucose, Bld: 97 mg/dL (ref 70–99)
Potassium: 4.2 mEq/L (ref 3.5–5.1)
Sodium: 140 mEq/L (ref 135–145)
Total Bilirubin: 0.2 mg/dL — ABNORMAL LOW (ref 0.3–1.2)
Total Protein: 6.5 g/dL (ref 6.0–8.3)

## 2013-04-19 NOTE — Patient Instructions (Signed)
Your preoperative limbus at 12:45 PM today at the hospital

## 2013-04-19 NOTE — H&P (Signed)
[name] is an 72 y.o. female. She is seen in preop evaluation for hysteroscopy D&C that is scheduled for the 20th. She is recently status post hospitalization for severe anemia due to the vaginal bleeding. This became a problem when she was placed on anticoagulants for atrial fibrillation. During her recent hospitalization atrial fibrillation self corrected and she was taken on anticoagulants. She returns today for preoperative evaluation for preoperative assessment and Pap smear Pertinent Gynecological History: Menses: post-menopausal and She has not been bleeding since the endometrial biopsy and discontinuation of the anticoagulants Bleeding: post menopausal bleeding biopsy showed atypical endometrial hyperplasia Contraception:  DES exposure: unknown Blood transfusions: Transfuse: Hospital recently Sexually transmitted diseases: no past history Previous GYN Procedures: Endometrial biopsy. No recent Pap smears in over a decade, Pap smear done 04/19/2013  Last mammogram:  Date:  Last pap: Results pending Date: 04/19/2013 OB History: G, P   Menstrual History: Menarche age:  Patient's last menstrual period was 03/31/2013.    Past Medical History  Diagnosis Date  . Diabetes mellitus   . Asthma   . Chronic knee pain   . Back pain, chronic   . Hypertension   . Vertigo   . Hyperlipidemia   . TIA (transient ischemic attack)   . Anemia    atrial fibrillation  Past Surgical History  Procedure Laterality Date  . Appendectomy    . Ovary surgery    . Cataract extraction w/phaco Left 01/16/2013    Procedure: CATARACT EXTRACTION PHACO AND INTRAOCULAR LENS PLACEMENT (IOC);  Surgeon: Mark T. Shapiro, MD;  Location: [facility];  Service: Ophthalmology;  Laterality: Left;  CDE:14.37  . Endometrial biopsy  04/03/2013         Family History  Problem Relation Age of Onset  . Arrhythmia Father     Atrial fib/Pacemaker  . Diabetes Father   . Heart failure Mother   . Diabetes Mother   .  Diabetes Sister     Social History:  reports that she has quit smoking. She does not have any smokeless tobacco history on file. She reports that she does not drink alcohol or use illicit drugs.  Allergies:  Allergies  Allergen Reactions  . Imitrex (Sumatriptan)   . Mango Flavor     Nausea and vomiting  . Oysters (Shellfish Allergy)     Nausea and vomiting     (Not in a hospital admission)  ROS  Blood pressure 172/90, height 5' (1.524 m), weight 294 lb 12.8 oz (133.72 kg), last menstrual period 03/31/2013. Physical Exam BP 172/90  Ht 5' (1.524 m)  Wt 294 lb 12.8 oz (133.72 kg)  BMI 57.57 kg/m2  LMP 03/31/2013 GENERAL: Well-developed, obese female in no acute distress.  HEENT: Normocephalic, atraumatic. Sclerae anicteric.  NECK: Supple. Normal thyroid.  LUNGS: Clear to auscultation bilaterally.  HEART: Irregularly irregular heart rate, pulse 70  BREASTS: Large, symmetric in size. No masses, skin changes, nipple drainage, or lymphadenopathy.  ABDOMEN: Soft, obese, nontender, nondistended. No organomegaly palpated. Exam limited by massive obesity PELVIC: Normal external female genitalia. Vagina is pink and rugated. Normal discharge. Normal cervix contour. Pap smear obtained. Unable to palpate uterus or adnexa secondary to obese habitus  EXTREMITIES: No cyanosis, clubbing, or edema, 2+ distal pulses.  No results found for this or any previous visit (from the past 24 hour(s)).  No results found.  Assessment/Plan: Postmenopausal bleeding, with atypical endometrial hyperplasia on endometrial sampling, now to be admitted for outpatient hysteroscopy D&C for more thorough endometrial evaluation. Morbid   obese Atrial fibrillation, recently corrected Plan: Probable spinal anesthesia if technically possible to allow full D&C.  Wayden Schwertner V 04/19/2013, 12:00 PM  

## 2013-04-19 NOTE — Progress Notes (Signed)
[name] is an 72 y.o. female. She is seen in preop evaluation for hysteroscopy D&C that is scheduled for the 20th. She is recently status post hospitalization for severe anemia due to the vaginal bleeding. This became a problem when she was placed on anticoagulants for atrial fibrillation. During her recent hospitalization atrial fibrillation self corrected and she was taken on anticoagulants. She returns today for preoperative evaluation for preoperative assessment and Pap smear Pertinent Gynecological History: Menses: post-menopausal and She has not been bleeding since the endometrial biopsy and discontinuation of the anticoagulants Bleeding: post menopausal bleeding biopsy showed atypical endometrial hyperplasia Contraception:  DES exposure: unknown Blood transfusions: Transfuse: Hospital recently Sexually transmitted diseases: no past history Previous GYN Procedures: Endometrial biopsy. No recent Pap smears in over a decade, Pap smear done 04/19/2013  Last mammogram:  Date:  Last pap: Results pending Date: 04/19/2013 OB History: G, P   Menstrual History: Menarche age:  Patient's last menstrual period was 03/31/2013.    Past Medical History  Diagnosis Date  . Diabetes mellitus   . Asthma   . Chronic knee pain   . Back pain, chronic   . Hypertension   . Vertigo   . Hyperlipidemia   . TIA (transient ischemic attack)   . Anemia    atrial fibrillation  Past Surgical History  Procedure Laterality Date  . Appendectomy    . Ovary surgery    . Cataract extraction w/phaco Left 01/16/2013    Procedure: CATARACT EXTRACTION PHACO AND INTRAOCULAR LENS PLACEMENT (IOC);  Surgeon: Loraine Leriche T. Nile Riggs, MD;  Location: [facility];  Service: Ophthalmology;  Laterality: Left;  CDE:14.37  . Endometrial biopsy  04/03/2013         Family History  Problem Relation Age of Onset  . Arrhythmia Father     Atrial fib/Pacemaker  . Diabetes Father   . Heart failure Mother   . Diabetes Mother   .  Diabetes Sister     Social History:  reports that she has quit smoking. She does not have any smokeless tobacco history on file. She reports that she does not drink alcohol or use illicit drugs.  Allergies:  Allergies  Allergen Reactions  . Imitrex (Sumatriptan)   . Mango Flavor     Nausea and vomiting  . Oysters (Shellfish Allergy)     Nausea and vomiting     (Not in a hospital admission)  ROS  Blood pressure 172/90, height 5' (1.524 m), weight 294 lb 12.8 oz (133.72 kg), last menstrual period 03/31/2013. Physical Exam BP 172/90  Ht 5' (1.524 m)  Wt 294 lb 12.8 oz (133.72 kg)  BMI 57.57 kg/m2  LMP 03/31/2013 GENERAL: Well-developed, obese female in no acute distress.  HEENT: Normocephalic, atraumatic. Sclerae anicteric.  NECK: Supple. Normal thyroid.  LUNGS: Clear to auscultation bilaterally.  HEART: Irregularly irregular heart rate, pulse 70  BREASTS: Large, symmetric in size. No masses, skin changes, nipple drainage, or lymphadenopathy.  ABDOMEN: Soft, obese, nontender, nondistended. No organomegaly palpated. Exam limited by massive obesity PELVIC: Normal external female genitalia. Vagina is pink and rugated. Normal discharge. Normal cervix contour. Pap smear obtained. Unable to palpate uterus or adnexa secondary to obese habitus  EXTREMITIES: No cyanosis, clubbing, or edema, 2+ distal pulses.  No results found for this or any previous visit (from the past 24 hour(s)).  No results found.  Assessment/Plan: Postmenopausal bleeding, with atypical endometrial hyperplasia on endometrial sampling, now to be admitted for outpatient hysteroscopy D&C for more thorough endometrial evaluation. Morbid  obese Atrial fibrillation, recently corrected Plan: Probable spinal anesthesia if technically possible to allow full D&C.  Nil Bolser V 04/19/2013, 12:00 PM

## 2013-04-19 NOTE — Patient Instructions (Addendum)
[name]  04/19/2013   Your procedure is scheduled on:   04/24/2013  Report to [facility] at  820  AM.  Call this number if you have problems the morning of surgery: [phone]   Remember:   Do not eat food or drink liquids after midnight.   Take these medicines the morning of surgery with A SIP OF WATER: cardizem,ultracet   Do not wear jewelry, make-up or nail polish.  Do not wear lotions, powders, or perfumes.   Do not shave 48 hours prior to surgery. Men may shave face and neck.  Do not bring valuables to the hospital.  Contacts, dentures or bridgework may not be worn into surgery.  Leave suitcase in the car. After surgery it may be brought to your room.  For patients admitted to the hospital, checkout time is 11:00 AM the day of discharge.   Patients discharged the day of surgery will not be allowed to drive  home.  Name and phone number of your driver: family  Special Instructions: Shower using CHG 2 nights before surgery and the night before surgery.  If you shower the day of surgery use CHG.  Use special wash - you have one bottle of CHG for all showers.  You should use approximately 1/3 of the bottle for each shower.   Please read over the following fact sheets that you were given: Pain Booklet, Coughing and Deep Breathing, MRSA Information, Surgical Site Infection Prevention, Anesthesia Post-op Instructions and Care and Recovery After Surgery Hysteroscopy Hysteroscopy is a procedure used for looking inside the womb (uterus). It may be done for many different reasons, including:  To evaluate abnormal bleeding, fibroid (benign, noncancerous) tumors, polyps, scar tissue (adhesions), and possibly cancer of the uterus.  To look for lumps (tumors) and other uterine growths.  To look for causes of why a woman cannot get pregnant (infertility), causes of recurrent loss of pregnancy (miscarriages), or a lost intrauterine device (IUD).  To perform a sterilization by  blocking the fallopian tubes from inside the uterus. A hysteroscopy should be done right after a menstrual period to be sure you are not pregnant. LET YOUR CAREGIVER KNOW ABOUT:   Allergies.  Medicines taken, including herbs, eyedrops, over-the-counter medicines, and creams.  Use of steroids (by mouth or creams).  Previous problems with anesthetics or numbing medicines.  History of bleeding or blood problems.  History of blood clots.  Possibility of pregnancy, if this applies.  Previous surgery.  Other health problems. RISKS AND COMPLICATIONS   Putting a hole in the uterus.  Excessive bleeding.  Infection.  Damage to the cervix.  Injury to other organs.  Allergic reaction to medicines.  Too much fluid used in the uterus for the procedure. BEFORE THE PROCEDURE   Do not take aspirin or blood thinners for a week before the procedure, or as directed. It can cause bleeding.  Arrive at least 60 minutes before the procedure or as directed to read and sign the necessary forms.  Arrange for someone to take you home after the procedure.  If you smoke, do not smoke for 2 weeks before the procedure. PROCEDURE   Your caregiver may give you medicine to relax you. He or she may also give you a medicine that numbs the area around the cervix (local anesthetic) or a medicine that makes you sleep (general anesthesia).  Sometimes, a medicine is placed in the cervix the day before the procedure. This medicine makes the  cervix have a larger opening (dilate). This makes it easier for the instrument to be inserted into the uterus.  A small instrument (hysteroscope) is inserted through the vagina into the uterus. This instrument is similar to a pencil-sized telescope with a light.  During the procedure, air or a liquid is put into the uterus, which allows the surgeon to see better.  Sometimes, tissue is gently scraped from inside the uterus. These tissue samples are sent to a specialist  who looks at tissue samples (pathologist). The pathologist will give a report to your caregiver. This will help your caregiver decide if further treatment is necessary. The report will also help your caregiver decide on the best treatment if the test comes back abnormal. AFTER THE PROCEDURE   If you had a general anesthetic, you may be groggy for a couple hours after the procedure.  If you had a local anesthetic, you will be advised to rest at the surgical center or caregiver's office until you are stable and feel ready to go home.  You may have some cramping for a couple days.  You may have bleeding, which varies from light spotting for a few days to menstrual-like bleeding for up to 3 to 7 days. This is normal.  Have someone take you home. FINDING OUT THE RESULTS OF YOUR TEST Not all test results are available during your visit. If your test results are not back during the visit, make an appointment with your caregiver to find out the results. Do not assume everything is normal if you have not heard from your caregiver or the medical facility. It is important for you to follow up on all of your test results. HOME CARE INSTRUCTIONS   Do not drive for 24 hours or as instructed.  Only take over-the-counter or prescription medicines for pain, discomfort, or fever as directed by your caregiver.  Do not take aspirin. It can cause or aggravate bleeding.  Do not drive or drink alcohol while taking pain medicine.  You may resume your usual diet.  Do not use tampons, douche, or have sexual intercourse for 2 weeks, or as advised by your caregiver.  Rest and sleep for the first 24 to 48 hours.  Take your temperature twice a day for 4 to 5 days. Write it down. Give these temperatures to your caregiver if they are abnormal (above 98.6 F or 37.0 C).  Take medicines your caregiver has ordered as directed.  Follow your caregiver's advice regarding diet, exercise, lifting, driving, and general  activities.  Take showers instead of baths for 2 weeks, or as recommended by your caregiver.  If you develop constipation:  Take a mild laxative with the advice of your caregiver.  Eat bran foods.  Drink enough water and fluids to keep your urine clear or pale yellow.  Try to have someone with you or available to you for the first 24 to 48 hours, especially if you had a general anesthetic.  Make sure you and your family understand everything about your operation and recovery.  Follow your caregiver's advice regarding follow-up appointments and Pap smears. SEEK MEDICAL CARE IF:   You feel dizzy or lightheaded.  You feel sick to your stomach (nauseous).  You develop abnormal vaginal discharge.  You develop a rash.  You have an abnormal reaction or allergy to your medicine.  You need stronger pain medicine. SEEK IMMEDIATE MEDICAL CARE IF:   Bleeding is heavier than a normal menstrual period or you have blood clots.  You have an oral temperature above 102 F (38.9 C), not controlled by medicine.  You have increasing cramps or pains not relieved with medicine.  You develop belly (abdominal) pain that does not seem to be related to the same area of earlier cramping and pain.  You pass out.  You develop pain in the tops of your shoulders (shoulder strap areas).  You develop shortness of breath. MAKE SURE YOU:   Understand these instructions.  Will watch your condition.  Will get help right away if you are not doing well or get worse. Document Released: 02/28/2001 Document Revised: 02/14/2012 Document Reviewed: 06/23/2009 [facility] [place] [facility] Patient Information 2013 [place], [place]. PATIENT INSTRUCTIONS POST-ANESTHESIA  IMMEDIATELY FOLLOWING SURGERY:  Do not drive or operate machinery for the first twenty four hours after surgery.  Do not make any important decisions for twenty four hours after surgery or while taking narcotic pain medications or sedatives.  If you develop  intractable nausea and vomiting or a severe headache please notify your doctor immediately.  FOLLOW-UP:  Please make an appointment with your surgeon as instructed. You do not need to follow up with anesthesia unless specifically instructed to do so.  WOUND CARE INSTRUCTIONS (if applicable):  Keep a dry clean dressing on the anesthesia/puncture wound site if there is drainage.  Once the wound has quit draining you may leave it open to air.  Generally you should leave the bandage intact for twenty four hours unless there is drainage.  If the epidural site drains for more than 36-48 hours please call the anesthesia department.  QUESTIONS?:  Please feel free to call your physician or the hospital operator if you have any questions, and they will be happy to assist you.

## 2013-04-24 ENCOUNTER — Encounter (HOSPITAL_COMMUNITY): Payer: Self-pay | Admitting: *Deleted

## 2013-04-24 ENCOUNTER — Encounter (HOSPITAL_COMMUNITY)
Admission: RE | Disposition: A | Discharge status: Home / Self Care | Payer: Self-pay | Source: Ambulatory Visit | Attending: Obstetrics and Gynecology

## 2013-04-24 ENCOUNTER — Encounter (HOSPITAL_COMMUNITY): Payer: Self-pay | Admitting: Anesthesiology

## 2013-04-24 ENCOUNTER — Ambulatory Visit (HOSPITAL_COMMUNITY)
Admission: RE | Admit: 2013-04-24 | Discharge: 2013-04-24 | Disposition: A | Discharge status: Home / Self Care | Payer: Medicare Other | Source: Ambulatory Visit | Attending: Obstetrics and Gynecology | Admitting: Obstetrics and Gynecology

## 2013-04-24 ENCOUNTER — Ambulatory Visit (HOSPITAL_COMMUNITY): Payer: Medicare Other | Admitting: Anesthesiology

## 2013-04-24 DIAGNOSIS — D500 Iron deficiency anemia secondary to blood loss (chronic): Secondary | ICD-10-CM

## 2013-04-24 DIAGNOSIS — E119 Type 2 diabetes mellitus without complications: Secondary | ICD-10-CM | POA: Insufficient documentation

## 2013-04-24 DIAGNOSIS — I4891 Unspecified atrial fibrillation: Secondary | ICD-10-CM | POA: Insufficient documentation

## 2013-04-24 DIAGNOSIS — N950 Postmenopausal bleeding: Secondary | ICD-10-CM | POA: Insufficient documentation

## 2013-04-24 DIAGNOSIS — N879 Dysplasia of cervix uteri, unspecified: Secondary | ICD-10-CM | POA: Insufficient documentation

## 2013-04-24 DIAGNOSIS — N8500 Endometrial hyperplasia, unspecified: Secondary | ICD-10-CM | POA: Insufficient documentation

## 2013-04-24 DIAGNOSIS — N8501 Benign endometrial hyperplasia: Secondary | ICD-10-CM

## 2013-04-24 DIAGNOSIS — Z7901 Long term (current) use of anticoagulants: Secondary | ICD-10-CM | POA: Insufficient documentation

## 2013-04-24 DIAGNOSIS — I10 Essential (primary) hypertension: Secondary | ICD-10-CM | POA: Insufficient documentation

## 2013-04-24 DIAGNOSIS — N8502 Endometrial intraepithelial neoplasia [EIN]: Secondary | ICD-10-CM

## 2013-04-24 DIAGNOSIS — Z01812 Encounter for preprocedural laboratory examination: Secondary | ICD-10-CM | POA: Insufficient documentation

## 2013-04-24 HISTORY — PX: HYSTEROSCOPY W/D&C: SHX1775

## 2013-04-24 LAB — GLUCOSE, CAPILLARY: Glucose-Capillary: 78 mg/dL (ref 70–99)

## 2013-04-24 SURGERY — DILATATION AND CURETTAGE /HYSTEROSCOPY
Anesthesia: General | Wound class: Clean Contaminated

## 2013-04-24 MED ORDER — FENTANYL CITRATE 0.05 MG/ML IJ SOLN: 25.0000 ug | INTRAMUSCULAR | Status: DC | PRN

## 2013-04-24 MED ORDER — FENTANYL CITRATE 0.05 MG/ML IJ SOLN
INTRAMUSCULAR | Status: AC
  Filled 2013-04-24: qty 2

## 2013-04-24 MED ORDER — ONDANSETRON HCL 4 MG/2ML IJ SOLN: 4.0000 mg | Freq: Once | INTRAMUSCULAR | Status: DC | PRN

## 2013-04-24 MED ORDER — DEXTROSE 5 % IV SOLN
3.0000 g | INTRAVENOUS | Status: DC | PRN
  Administered 2013-04-24: 3 g via INTRAVENOUS

## 2013-04-24 MED ORDER — DEXTROSE 50 % IV SOLN
12.5000 g | Freq: Once | INTRAVENOUS | Status: AC
  Administered 2013-04-24: 12.5 g via INTRAVENOUS

## 2013-04-24 MED ORDER — FENTANYL CITRATE 0.05 MG/ML IJ SOLN
INTRAMUSCULAR | Status: DC | PRN
  Administered 2013-04-24 (×6): 25 ug via INTRAVENOUS

## 2013-04-24 MED ORDER — ATROPINE SULFATE 1 MG/ML IJ SOLN
INTRAMUSCULAR | Status: AC
  Filled 2013-04-24: qty 1

## 2013-04-24 MED ORDER — LACTATED RINGERS IV SOLN
INTRAVENOUS | Status: DC | PRN
  Administered 2013-04-24: 09:00:00 via INTRAVENOUS

## 2013-04-24 MED ORDER — GLYCOPYRROLATE 0.2 MG/ML IJ SOLN
INTRAMUSCULAR | Status: AC
  Filled 2013-04-24: qty 1

## 2013-04-24 MED ORDER — MIDAZOLAM HCL 2 MG/2ML IJ SOLN
1.0000 mg | INTRAMUSCULAR | Status: DC | PRN
  Administered 2013-04-24: 2 mg via INTRAVENOUS

## 2013-04-24 MED ORDER — CEFAZOLIN SODIUM-DEXTROSE 2-3 GM-% IV SOLR
INTRAVENOUS | Status: AC
  Filled 2013-04-24: qty 50

## 2013-04-24 MED ORDER — PROPOFOL 10 MG/ML IV BOLUS
INTRAVENOUS | Status: DC | PRN
  Administered 2013-04-24: 180 mg via INTRAVENOUS

## 2013-04-24 MED ORDER — CEFAZOLIN SODIUM 1-5 GM-% IV SOLN
INTRAVENOUS | Status: AC
  Filled 2013-04-24: qty 50

## 2013-04-24 MED ORDER — BUPIVACAINE-EPINEPHRINE PF 0.5-1:200000 % IJ SOLN
INTRAMUSCULAR | Status: AC
  Filled 2013-04-24: qty 10

## 2013-04-24 MED ORDER — SODIUM CHLORIDE 0.9 % IR SOLN
Status: DC | PRN
  Administered 2013-04-24: 1000 mL

## 2013-04-24 MED ORDER — LIDOCAINE HCL (CARDIAC) 10 MG/ML IV SOLN
INTRAVENOUS | Status: DC | PRN
  Administered 2013-04-24: 50 mg via INTRAVENOUS

## 2013-04-24 MED ORDER — ONDANSETRON HCL 4 MG/2ML IJ SOLN
INTRAMUSCULAR | Status: AC
  Filled 2013-04-24: qty 2

## 2013-04-24 MED ORDER — ENOXAPARIN SODIUM 40 MG/0.4ML ~~LOC~~ SOLN
SUBCUTANEOUS | Status: AC
  Filled 2013-04-24: qty 0.4

## 2013-04-24 MED ORDER — ATROPINE SULFATE 1 MG/ML IJ SOLN
0.5000 mg | Freq: Once | INTRAMUSCULAR | Status: AC
  Administered 2013-04-24: 0.5 mg via INTRAVENOUS

## 2013-04-24 MED ORDER — ENOXAPARIN SODIUM 40 MG/0.4ML ~~LOC~~ SOLN
40.0000 mg | SUBCUTANEOUS | Status: AC
  Administered 2013-04-24: 40 mg via SUBCUTANEOUS

## 2013-04-24 MED ORDER — EPHEDRINE SULFATE 50 MG/ML IJ SOLN
INTRAMUSCULAR | Status: DC | PRN
  Administered 2013-04-24: 10 mg via INTRAVENOUS

## 2013-04-24 MED ORDER — GLYCOPYRROLATE 0.2 MG/ML IJ SOLN
INTRAMUSCULAR | Status: DC | PRN
  Administered 2013-04-24: 0.2 mg via INTRAVENOUS

## 2013-04-24 MED ORDER — LACTATED RINGERS IV SOLN
INTRAVENOUS | Status: DC
  Administered 2013-04-24 (×2): via INTRAVENOUS

## 2013-04-24 MED ORDER — FENTANYL CITRATE 0.05 MG/ML IJ SOLN
25.0000 ug | INTRAMUSCULAR | Status: DC | PRN
  Administered 2013-04-24: 25 ug via INTRAVENOUS

## 2013-04-24 MED ORDER — CEFAZOLIN SODIUM 1-5 GM-% IV SOLN: 1.0000 g | INTRAVENOUS | Status: DC

## 2013-04-24 MED ORDER — DEXTROSE 50 % IV SOLN
INTRAVENOUS | Status: AC
  Filled 2013-04-24: qty 50

## 2013-04-24 MED ORDER — CEFAZOLIN SODIUM 10 G IJ SOLR: 3.0000 g | INTRAMUSCULAR | Status: DC

## 2013-04-24 MED ORDER — CEFAZOLIN SODIUM-DEXTROSE 2-3 GM-% IV SOLR: 2.0000 g | INTRAVENOUS | Status: DC

## 2013-04-24 MED ORDER — SUCCINYLCHOLINE CHLORIDE 20 MG/ML IJ SOLN
INTRAMUSCULAR | Status: AC
  Filled 2013-04-24: qty 1

## 2013-04-24 MED ORDER — ONDANSETRON HCL 4 MG/2ML IJ SOLN
4.0000 mg | Freq: Once | INTRAMUSCULAR | Status: AC
  Administered 2013-04-24: 4 mg via INTRAVENOUS

## 2013-04-24 MED ORDER — MIDAZOLAM HCL 2 MG/2ML IJ SOLN
INTRAMUSCULAR | Status: AC
  Filled 2013-04-24: qty 2

## 2013-04-24 SURGICAL SUPPLY — 27 items
BAG HAMPER (MISCELLANEOUS) ×2 IMPLANT
CLOTH BEACON ORANGE TIMEOUT ST (SAFETY) ×2 IMPLANT
COVER LIGHT HANDLE STERIS (MISCELLANEOUS) ×4 IMPLANT
DECANTER SPIKE VIAL GLASS SM (MISCELLANEOUS) ×2 IMPLANT
FORMALIN 10 PREFIL 120ML (MISCELLANEOUS) ×2 IMPLANT
GLOVE BIOGEL PI IND STRL 7.0 (GLOVE) ×3 IMPLANT
GLOVE BIOGEL PI INDICATOR 7.0 (GLOVE) ×3
GLOVE ECLIPSE 9.0 STRL (GLOVE) ×2 IMPLANT
GLOVE INDICATOR STER SZ 9 (GLOVE) ×2 IMPLANT
GLOVE SS BIOGEL STRL SZ 6.5 (GLOVE) ×1 IMPLANT
GLOVE SUPERSENSE BIOGEL SZ 6.5 (GLOVE) ×1
GOWN STRL REIN 3XL LVL4 (GOWN DISPOSABLE) ×2 IMPLANT
GOWN STRL REIN XL XLG (GOWN DISPOSABLE) ×2 IMPLANT
INST SET HYSTEROSCOPY (KITS) ×2 IMPLANT
IV NS IRRIG 3000ML ARTHROMATIC (IV SOLUTION) ×2 IMPLANT
KIT ROOM TURNOVER APOR (KITS) ×2 IMPLANT
MANIFOLD NEPTUNE II (INSTRUMENTS) ×2 IMPLANT
NS IRRIG 1000ML POUR BTL (IV SOLUTION) ×2 IMPLANT
PACK PERI GYN (CUSTOM PROCEDURE TRAY) ×2 IMPLANT
PAD ARMBOARD 7.5X6 YLW CONV (MISCELLANEOUS) ×2 IMPLANT
PAD TELFA 3X4 1S STER (GAUZE/BANDAGES/DRESSINGS) ×2 IMPLANT
SET BASIN LINEN APH (SET/KITS/TRAYS/PACK) ×2 IMPLANT
SET BERKELEY SUCTION TUBING (SUCTIONS) ×2 IMPLANT
SET CYSTO W/LG BORE CLAMP LF (SET/KITS/TRAYS/PACK) ×2 IMPLANT
SPONGE GAUZE 4X4 12PLY (GAUZE/BANDAGES/DRESSINGS) ×2 IMPLANT
SYR CONTROL 10ML LL (SYRINGE) ×2 IMPLANT
VACURETTE 6 ASPIR F TIP BERK (CANNULA) ×2 IMPLANT

## 2013-04-24 NOTE — Op Note (Signed)
See operative details included in the brief op notes.

## 2013-04-24 NOTE — Transfer of Care (Signed)
Immediate Anesthesia Transfer of Care Note  Patient: [name]  Procedure(s) Performed: Procedure(s): SUCTION DILATATION AND CURETTAGE /HYSTEROSCOPY (N/A)  Patient Location: PACU  Anesthesia Type:General  Level of Consciousness: sedated  Airway & Oxygen Therapy: Patient Spontanous Breathing and Patient connected to face mask oxygen  Post-op Assessment: Report given to PACU RN and Post -op Vital signs reviewed and stable  Post vital signs: Reviewed and stable  Complications: No apparent anesthesia complications

## 2013-04-24 NOTE — Anesthesia Procedure Notes (Signed)
Procedure Name: LMA Insertion Date/Time: 04/24/2013 11:51 AM Performed by: Carolyne Littles, Terik Haughey L Pre-anesthesia Checklist: Patient identified, Timeout performed, Emergency Drugs available, Suction available and Patient being monitored Patient Re-evaluated:Patient Re-evaluated prior to inductionOxygen Delivery Method: Circle system utilized Preoxygenation: Pre-oxygenation with 100% oxygen Intubation Type: IV induction Ventilation: Mask ventilation without difficulty LMA: LMA inserted LMA Size: 4.0 Number of attempts: 1 Placement Confirmation: positive ETCO2 and breath sounds checked- equal and bilateral Tube secured with: Tape Dental Injury: Teeth and Oropharynx as per pre-operative assessment

## 2013-04-24 NOTE — Anesthesia Preprocedure Evaluation (Signed)
Anesthesia Evaluation  Patient identified by MRN, date of birth, ID band Patient awake    Reviewed: Allergy & Precautions, H&P , NPO status , Patient's Chart, lab work & pertinent test results  Airway Mallampati: IV TM Distance: >3 FB Neck ROM: Full    Dental  (+) Edentulous Upper   Pulmonary asthma , Current Smoker,    + decreased breath sounds      Cardiovascular hypertension, Pt. on medications Rhythm:Regular Rate:Normal     Neuro/Psych    GI/Hepatic   Endo/Other  diabetes, Type 2, Oral Hypoglycemic AgentsMorbid obesity  Renal/GU      Musculoskeletal   Abdominal   Peds  Hematology   Anesthesia Other Findings   Reproductive/Obstetrics                           Anesthesia Physical Anesthesia Plan  ASA: III  Anesthesia Plan: General   Post-op Pain Management:    Induction: Intravenous  Airway Management Planned: LMA  Additional Equipment:   Intra-op Plan:   Post-operative Plan: Extubation in OR  Informed Consent: I have reviewed the patients History and Physical, chart, labs and discussed the procedure including the risks, benefits and alternatives for the proposed anesthesia with the patient or authorized representative who has indicated his/her understanding and acceptance.     Plan Discussed with:   Anesthesia Plan Comments:         Anesthesia Quick Evaluation

## 2013-04-24 NOTE — Brief Op Note (Signed)
04/24/2013  12:41 PM  PATIENT:  [name]  72 y.o. female  PRE-OPERATIVE DIAGNOSIS:  postmenopausal bleeding anemia;atypical endometrial hyperplasia, rule out cancer   POST-OPERATIVE DIAGNOSIS:  postmenopausal bleeding; anemia;atypical endometrial hyperplasia, rule out cancer   PROCEDURE:  Procedure(s): SUCTION DILATATION AND CURETTAGE /HYSTEROSCOPY (N/A)  SURGEON:  Surgeon(s) and Role:    * Tilda Burrow, MD - Primary  PHYSICIAN ASSISTANT:   ASSISTANTS: Laurena Bering, CST   ANESTHESIA:   MAC and With LMA  EBL:  Total I/O In: 200 [I.V.:200] Out: 15 [Blood:15]  BLOOD ADMINISTERED: None  DRAINS: none   LOCAL MEDICATIONS USED:  NONE  SPECIMEN:  Source of Specimen:  Uterine curettings  DISPOSITION OF SPECIMEN:  PATHOLOGY  COUNTS:  YES  TOURNIQUET:  * No tourniquets in log *  DICTATION: .Dragon Dictation Patient was taken to the operating room, prepped and draped, timeout conducted and procedure confirmed by surgical team. Ancef 3 g intravenously was administered. Speculum was inserted, cervix identified grasped uterus sounded to 9 cm in the anteflexed position, with cervix dilated to 25 [place] allowing introduction of the 30 rigid hysteroscope which visualized a thickened endometrium with large vessels on its surface atypical tissues appear smooth curettage was attempted but the thickness of the tissue oriented suction curettage. A 6 mm suction curette was introduced suction curettage initiated and a very generous amount of tissue fragments obtained. Crossclamping confirm the majority of the tissue of note had been removed. Repeat curettage and hysteroscopy again confirmed satisfactory endometrial curetting sample was passed off for pathology patient allowed to awaken and go to recovery room in stable condition.  PLAN OF CARE: Discharge to home after PACU  PATIENT DISPOSITION:  PACU - hemodynamically stable.   Delay start of Pharmacological VTE agent (>24hrs) due  to surgical blood loss or risk of bleeding: not applicable

## 2013-04-24 NOTE — Interval H&P Note (Signed)
History and Physical Interval Note:  04/24/2013 10:48 AM  [name]  has presented today for surgery, with the diagnosis of postmenopausal bleeding  anemia thickened endometrium  The various methods of treatment have been discussed with the patient and family. After consideration of risks, benefits and other options for treatment, the patient has consented to  Procedure(s): DILATATION AND CURETTAGE /HYSTEROSCOPY (N/A) as a surgical intervention .  The patient's history has been reviewed, patient examined, no change in status, stable for surgery.  I have reviewed the patient's chart and labs.  Questions were answered to the patient's satisfaction.     [name]

## 2013-04-24 NOTE — Anesthesia Postprocedure Evaluation (Signed)
  Anesthesia Post-op Note  Patient: [name]  Procedure(s) Performed: Procedure(s): SUCTION DILATATION AND CURETTAGE /HYSTEROSCOPY (N/A)  Patient Location: PACU  Anesthesia Type:General  Level of Consciousness: sedated and patient cooperative  Airway and Oxygen Therapy: Patient Spontanous Breathing and Patient connected to face mask oxygen  Post-op Pain: none  Post-op Assessment: Post-op Vital signs reviewed, Patient's Cardiovascular Status Stable, Respiratory Function Stable and Patent Airway  Post-op Vital Signs: Reviewed and stable  Complications: No apparent anesthesia complications

## 2013-04-24 NOTE — Preoperative (Signed)
Beta Blockers   Reason not to administer Beta Blockers:Not Applicable 

## 2013-04-24 NOTE — H&P (View-Only) (Signed)
[name] is an 72 y.o. female. She is seen in preop evaluation for hysteroscopy D&C that is scheduled for the 20th. She is recently status post hospitalization for severe anemia due to the vaginal bleeding. This became a problem when she was placed on anticoagulants for atrial fibrillation. During her recent hospitalization atrial fibrillation self corrected and she was taken on anticoagulants. She returns today for preoperative evaluation for preoperative assessment and Pap smear Pertinent Gynecological History: Menses: post-menopausal and She has not been bleeding since the endometrial biopsy and discontinuation of the anticoagulants Bleeding: post menopausal bleeding biopsy showed atypical endometrial hyperplasia Contraception:  DES exposure: unknown Blood transfusions: Transfuse: Hospital recently Sexually transmitted diseases: no past history Previous GYN Procedures: Endometrial biopsy. No recent Pap smears in over a decade, Pap smear done 04/19/2013  Last mammogram:  Date:  Last pap: Results pending Date: 04/19/2013 OB History: G, P   Menstrual History: Menarche age:  Patient's last menstrual period was 03/31/2013.    Past Medical History  Diagnosis Date  . Diabetes mellitus   . Asthma   . Chronic knee pain   . Back pain, chronic   . Hypertension   . Vertigo   . Hyperlipidemia   . TIA (transient ischemic attack)   . Anemia    atrial fibrillation  Past Surgical History  Procedure Laterality Date  . Appendectomy    . Ovary surgery    . Cataract extraction w/phaco Left 01/16/2013    Procedure: CATARACT EXTRACTION PHACO AND INTRAOCULAR LENS PLACEMENT (IOC);  Surgeon: Mark T. Shapiro, MD;  Location: [facility];  Service: Ophthalmology;  Laterality: Left;  CDE:14.37  . Endometrial biopsy  04/03/2013         Family History  Problem Relation Age of Onset  . Arrhythmia Father     Atrial fib/Pacemaker  . Diabetes Father   . Heart failure Mother   . Diabetes Mother   .  Diabetes Sister     Social History:  reports that she has quit smoking. She does not have any smokeless tobacco history on file. She reports that she does not drink alcohol or use illicit drugs.  Allergies:  Allergies  Allergen Reactions  . Imitrex (Sumatriptan)   . Mango Flavor     Nausea and vomiting  . Oysters (Shellfish Allergy)     Nausea and vomiting     (Not in a hospital admission)  ROS  Blood pressure 172/90, height 5' (1.524 m), weight 294 lb 12.8 oz (133.72 kg), last menstrual period 03/31/2013. Physical Exam BP 172/90  Ht 5' (1.524 m)  Wt 294 lb 12.8 oz (133.72 kg)  BMI 57.57 kg/m2  LMP 03/31/2013 GENERAL: Well-developed, obese female in no acute distress.  HEENT: Normocephalic, atraumatic. Sclerae anicteric.  NECK: Supple. Normal thyroid.  LUNGS: Clear to auscultation bilaterally.  HEART: Irregularly irregular heart rate, pulse 70  BREASTS: Large, symmetric in size. No masses, skin changes, nipple drainage, or lymphadenopathy.  ABDOMEN: Soft, obese, nontender, nondistended. No organomegaly palpated. Exam limited by massive obesity PELVIC: Normal external female genitalia. Vagina is pink and rugated. Normal discharge. Normal cervix contour. Pap smear obtained. Unable to palpate uterus or adnexa secondary to obese habitus  EXTREMITIES: No cyanosis, clubbing, or edema, 2+ distal pulses.  No results found for this or any previous visit (from the past 24 hour(s)).  No results found.  Assessment/Plan: Postmenopausal bleeding, with atypical endometrial hyperplasia on endometrial sampling, now to be admitted for outpatient hysteroscopy D&C for more thorough endometrial evaluation. Morbid   obese Atrial fibrillation, recently corrected Plan: Probable spinal anesthesia if technically possible to allow full D&C.  Talasia Saulter V 04/19/2013, 12:00 PM  

## 2013-04-25 ENCOUNTER — Encounter (HOSPITAL_COMMUNITY): Payer: Self-pay | Admitting: Obstetrics and Gynecology

## 2013-04-25 LAB — GLUCOSE, CAPILLARY: Glucose-Capillary: 90 mg/dL (ref 70–99)

## 2013-04-26 ENCOUNTER — Ambulatory Visit: Payer: Self-pay | Admitting: Obstetrics and Gynecology

## 2013-05-03 ENCOUNTER — Encounter: Payer: Self-pay | Admitting: Obstetrics and Gynecology

## 2013-05-03 ENCOUNTER — Ambulatory Visit (INDEPENDENT_AMBULATORY_CARE_PROVIDER_SITE_OTHER): Payer: Medicare Other | Admitting: Obstetrics and Gynecology

## 2013-05-03 VITALS — BP 152/90 | Ht 60.0 in | Wt 291.0 lb

## 2013-05-03 DIAGNOSIS — N8502 Endometrial intraepithelial neoplasia [EIN]: Secondary | ICD-10-CM

## 2013-05-03 NOTE — Progress Notes (Signed)
Pathology of D&C :  Atypical endometrial hyperplasia, no cancer              Patient losing weight, motivated. Discussed Hysterectomy: will allow brief delay for weight loss, rechk one month.  Prob 2 month delay okay. Dietician referrral rechk 4 wk

## 2013-05-03 NOTE — Patient Instructions (Signed)
Weight loss goal : 10 pound/month Recheck monthly

## 2013-05-14 ENCOUNTER — Telehealth: Payer: Self-pay | Admitting: Obstetrics and Gynecology

## 2013-05-14 NOTE — Telephone Encounter (Signed)
Left message. JSY 

## 2013-05-30 ENCOUNTER — Encounter: Payer: Self-pay | Admitting: Obstetrics and Gynecology

## 2013-05-30 ENCOUNTER — Ambulatory Visit (INDEPENDENT_AMBULATORY_CARE_PROVIDER_SITE_OTHER): Payer: Medicare Other | Admitting: Obstetrics and Gynecology

## 2013-05-30 VITALS — BP 122/86 | Ht 60.0 in | Wt 269.0 lb

## 2013-05-30 DIAGNOSIS — N8502 Endometrial intraepithelial neoplasia [EIN]: Secondary | ICD-10-CM

## 2013-05-30 NOTE — Telephone Encounter (Signed)
Pt never returned call but was seen in office on 05/30/13. JSY

## 2013-05-30 NOTE — Patient Instructions (Signed)
KEEP UP THE GREAT WEIGHT LOSS FOLLOWUP 4 WK  PROBABLE SURGERY LATE July

## 2013-05-30 NOTE — Progress Notes (Signed)
Patient ID: [name], female   DOB: [birth year], 72 y.o.   MRN: [id number]    [facility] Visit  Patient name: [name] MRN [id number]  Date of birth: [birth year]  CC & HPI:  [name] is a 72 y.o. female presenting today for reassessment to see if she is a hysterectomy canididate. Pt here today for follow up/pre op. Pt stated Dr. Emelda Fear wanted her to lose 20lbs before surgery was done. Pt denies any bleeding at this time.  Wt now 269 BMI 53 was 291 BMI 57 BP INmproved, sleeps better,        Working out with weights ROS:  No blood thinnner at present. On Asa 81 mg daily  Pertinent History Reviewed:  Medical & Surgical Hx:  Reviewed: Significant for arthritis Medications: Reviewed & Updated - see associated section Social History: Reviewed -  reports that she has quit smoking. Her smoking use included Cigarettes. She has a 7.5 pack-year smoking history. She has never used smokeless tobacco.  Objective Findings:  Vitals: BP 122/86  Ht 5' (1.524 m)  Wt 269 lb (122.018 kg)  BMI 52.54 kg/m2  LMP 03/31/2013  Physical Examination: General appearance - alert, well appearing, and in no distress, oriented to person, place, and time and overweight Mental status - alert, oriented to person, place, and time, normal mood, behavior, speech, dress, motor activity, and thought processes, motivated to continue wt loss Chest - not examined Abdomen - soft, nontender, nondistended, no masses or organomegaly Still markedly obese Pelvic - examination not indicated Extremities - peripheral pulses normal, no pedal edema, no clubbing or cyanosis no swelling    Assessment & Plan:  Excellent wt loss Still bmi high Atypical simple and complex endometrial hyperplasia on D&C S/p d& c Consider surgery later in July sees fanta July 15 I will discuss with Gyn ONC to see if medical therapy while continued wt loss agressively pursued, to reduce risk of surgery. F/u 4 wk

## 2013-06-28 ENCOUNTER — Encounter: Payer: Self-pay | Admitting: Obstetrics and Gynecology

## 2013-06-28 ENCOUNTER — Ambulatory Visit (INDEPENDENT_AMBULATORY_CARE_PROVIDER_SITE_OTHER): Payer: Medicare Other | Admitting: Obstetrics and Gynecology

## 2013-06-28 VITALS — BP 152/98 | Ht 60.0 in | Wt 282.6 lb

## 2013-06-28 DIAGNOSIS — Z01818 Encounter for other preprocedural examination: Secondary | ICD-10-CM

## 2013-06-28 DIAGNOSIS — N8502 Endometrial intraepithelial neoplasia [EIN]: Secondary | ICD-10-CM

## 2013-06-28 NOTE — Progress Notes (Signed)
Patient ID: [name], female   DOB: 06-14-1941, 72 y.o.   MRN: [id number] Pt here today for pre op for hysterectomy

## 2013-06-28 NOTE — Patient Instructions (Signed)
preop appt at hospital 14th August at 9 AM. Please call [phone] for any questions. Surgery is currently scheduled for 19 August at 10 AM Weight loss it is critically important between now and surgery

## 2013-06-28 NOTE — Progress Notes (Addendum)
[name] is an 72 y.o. female. . She is here for a preop appointment for proceeding with hysterectomy, TAH/BSO, to be performed for atypical endometrial hyperplasia on D&C. The patient's had no bleeding since her D&C. Patient was allegedly trying to lose weight but she reports that her knees begun to bother her so not only has she quit losing weight but has gained 13 pounds, 269 pounds increasing 282 sats are last appointment, one month. This raises the question of the accuracy of her last weight taken. Patient saw Dr. Felecia Shelling last week for blood pressure which was allegedly under control. The patient has not taken her blood pressure medicines this morning Pertinent Gynecological History: Menses: post-menopausal Bleeding: post menopausal bleeding Contraception: post menopausal status DES exposure: unknown Blood transfusions: none Sexually transmitted diseases: no past history and GC chlamydia collected 8 24 Previous GYN Procedures: DNC  Last mammogram: normal Date:  Last pap: normal Date: May 2014 OB History: G, P   Menstrual History: Menarche age:  Patient's last menstrual period was 03/31/2013.    Past Medical History  Diagnosis Date  . Diabetes mellitus   . Asthma   . Chronic knee pain   . Back pain, chronic   . Hypertension   . Vertigo   . Hyperlipidemia   . TIA (transient ischemic attack)   . Anemia   . HOH (hard of hearing)   . Arthritis    morbid obesity BMI greater than 50  Past Surgical History  Procedure Laterality Date  . Appendectomy    . Ovary surgery    . Cataract extraction w/phaco Left 01/16/2013    Procedure: CATARACT EXTRACTION PHACO AND INTRAOCULAR LENS PLACEMENT (IOC);  Surgeon: Loraine Leriche T. Nile Riggs, MD;  Location: [facility];  Service: Ophthalmology;  Laterality: Left;  CDE:14.37  . Endometrial biopsy  04/03/2013       . Hysteroscopy w/d&c N/A 04/24/2013    Procedure: SUCTION DILATATION AND CURETTAGE /HYSTEROSCOPY;  Surgeon: Tilda Burrow, MD;  Location:  [facility];  Service: Gynecology;  Laterality: N/A;    Family History  Problem Relation Age of Onset  . Arrhythmia Father     Atrial fib/Pacemaker  . Diabetes Father   . Heart failure Mother   . Diabetes Mother   . Diabetes Sister     Social History:  reports that she has quit smoking. Her smoking use included Cigarettes. She has a 7.5 pack-year smoking history. She has never used smokeless tobacco. She reports that she does not drink alcohol or use illicit drugs.  Allergies:  Allergies  Allergen Reactions  . Imitrex (Sumatriptan)   . Mango Flavor     Nausea and vomiting  . Oysters (Shellfish Allergy)     Nausea and vomiting     (Not in a hospital admission)  ROS left knee discomfort and swelling resulting in intermittent use of walker. No calf tenderness or thigh tenderness  Blood pressure 152/98, height 5' (1.524 m), weight 282 lb 9.6 oz (128.187 kg), last menstrual period 03/31/2013. Physical Exam Physical Examination: General appearance - alert, well appearing, and in no distress, oriented to person, place, and time and overweight Mental status - alert, oriented to person, place, and time, normal mood, behavior, speech, dress, motor activity, and thought processes Mouth - mucous membranes moist, pharynx normal without lesions and dental hygiene good Chest - clear to auscultation, no wheezes, rales or rhonchi, symmetric air entry, no tachypnea, retractions or cyanosis Heart - normal rate, regular rhythm, normal S1, S2, no murmurs,  rubs, clicks or gallops Abdomen - soft, nontender, nondistended, no masses or organomegaly Exam limited by massive truncal obesity chronic skin changes on the lower side of the pannus. Hysterectomy was likely require midline incision from umbilicus to symphysis pubis Pelvic - normal external genitalia, vulva, vagina, cervix, uterus and adnexa, exam chaperoned by crystal strader , RN, GC and Chlamydia collected No results found for this or any previous  visit (from the past 24 hour(s)).  No results found.  Assessment/Plan:  Atypical endometrial hyperplasia Morbid obesity History of major fibrillation currently resolved Diabetes mellitus type 2 stable Arthritis left knee Chronic hypertension  hard of hearing  Plan proceed with total abdominal hysterectomy bilateral salpingo-oophorectomy.  Tilda Burrow 06/28/2013, 9:10 AM  Consult made with Dr De Blanch, Gyn Oncologist at [facility] regarding risks of hysterectomy in pt of this age/weight/risk factors. He advises that Progestin supression of endometrium is an option, either by IUD Mirena or cyclic progesterone 15d/month. Monitoring would be by endometrial biopsy at 3months, then q 6months. Pt will be informed.

## 2013-06-29 ENCOUNTER — Telehealth: Payer: Self-pay | Admitting: Obstetrics and Gynecology

## 2013-06-29 LAB — GC/CHLAMYDIA PROBE AMP
CT Probe RNA: NEGATIVE
GC Probe RNA: NEGATIVE

## 2013-06-29 NOTE — Telephone Encounter (Signed)
Pt given info on IUD plan, Pt will get me the number for me to discuss with her daughter, then we will schedule IUD insertion.Marland Kitchen

## 2013-07-17 ENCOUNTER — Encounter: Payer: Self-pay | Admitting: Gastroenterology

## 2013-07-19 ENCOUNTER — Other Ambulatory Visit (HOSPITAL_COMMUNITY): Payer: Medicare Other

## 2013-07-23 ENCOUNTER — Encounter (HOSPITAL_COMMUNITY)
Admission: RE | Admit: 2013-07-23 | Discharge: 2013-07-23 | Disposition: A | Discharge status: Home / Self Care | Payer: Medicare Other | Source: Ambulatory Visit | Attending: Orthopaedic Surgery | Admitting: Orthopaedic Surgery

## 2013-07-23 ENCOUNTER — Encounter (HOSPITAL_COMMUNITY): Payer: Self-pay | Admitting: Pharmacy Technician

## 2013-07-23 ENCOUNTER — Other Ambulatory Visit: Payer: Self-pay | Admitting: Radiology

## 2013-07-23 ENCOUNTER — Encounter (HOSPITAL_COMMUNITY): Payer: Self-pay

## 2013-07-23 LAB — CBC WITH DIFFERENTIAL/PLATELET
Basophils Absolute: 0 10*3/uL (ref 0.0–0.1)
Basophils Relative: 0 % (ref 0–1)
Eosinophils Absolute: 0.1 10*3/uL (ref 0.0–0.7)
Eosinophils Relative: 1 % (ref 0–5)
HCT: 37.5 % (ref 36.0–46.0)
Hemoglobin: 12.2 g/dL (ref 12.0–15.0)
Lymphocytes Relative: 31 % (ref 12–46)
Lymphs Abs: 1.7 10*3/uL (ref 0.7–4.0)
MCH: 31.4 pg (ref 26.0–34.0)
MCHC: 32.5 g/dL (ref 30.0–36.0)
MCV: 96.4 fL (ref 78.0–100.0)
Monocytes Absolute: 0.5 10*3/uL (ref 0.1–1.0)
Monocytes Relative: 8 % (ref 3–12)
Neutro Abs: 3.3 10*3/uL (ref 1.7–7.7)
Neutrophils Relative %: 59 % (ref 43–77)
Platelets: 223 10*3/uL (ref 150–400)
RBC: 3.89 MIL/uL (ref 3.87–5.11)
RDW: 15.6 % — ABNORMAL HIGH (ref 11.5–15.5)
WBC: 5.5 10*3/uL (ref 4.0–10.5)

## 2013-07-23 LAB — COMPREHENSIVE METABOLIC PANEL
ALT: 13 U/L (ref 0–35)
AST: 16 U/L (ref 0–37)
Albumin: 3.4 g/dL — ABNORMAL LOW (ref 3.5–5.2)
Alkaline Phosphatase: 71 U/L (ref 39–117)
BUN: 12 mg/dL (ref 6–23)
CO2: 29 mEq/L (ref 19–32)
Calcium: 9 mg/dL (ref 8.4–10.5)
Chloride: 103 mEq/L (ref 96–112)
Creatinine, Ser: 0.84 mg/dL (ref 0.50–1.10)
GFR calc Af Amer: 80 mL/min — ABNORMAL LOW (ref >90)
GFR calc non Af Amer: 69 mL/min — ABNORMAL LOW (ref >90)
Glucose, Bld: 78 mg/dL (ref 70–99)
Potassium: 4.1 mEq/L (ref 3.5–5.1)
Sodium: 141 mEq/L (ref 135–145)
Total Bilirubin: 0.3 mg/dL (ref 0.3–1.2)
Total Protein: 6.8 g/dL (ref 6.0–8.3)

## 2013-07-23 NOTE — Patient Instructions (Addendum)
[name]  07/23/2013   Your procedure is scheduled on:  07/26/13  [facility] to [facility] [place] [facility].  Call this number if you have problems the morning of surgery: [phone]   Remember:   Do not eat food or drink liquids after midnight.   Take these medicines the morning of surgery with A SIP OF WATER: celebrex, cardizem, xopenex   Do not wear jewelry, make-up or nail polish.  Do not wear lotions, powders, or perfumes. You may wear deodorant.  Do not shave 48 hours prior to surgery. Men may shave face and neck.  Do not bring valuables to the hospital.  [facility] is not responsible                   for any belongings or valuables.  Contacts, dentures or bridgework may not be worn into surgery.  Leave suitcase in the car. After surgery it may be brought to your room.  For patients admitted to the hospital, checkout time is 11:00 AM the day of  discharge.   Patients discharged the day of surgery will not be allowed to drive  home.  Name and phone number of your driver: family  Special Instructions: N/A   Please read over the following fact sheets that you were given: Pain Booklet, Surgical Site Infection Prevention, Anesthesia Post-op Instructions and Care and Recovery After Surgery   PATIENT INSTRUCTIONS POST-ANESTHESIA  IMMEDIATELY FOLLOWING SURGERY:  Do not drive or operate machinery for the first twenty four hours after surgery.  Do not make any important decisions for twenty four hours after surgery or while taking narcotic pain medications or sedatives.  If you develop intractable nausea and vomiting or a severe headache please notify your doctor immediately.  FOLLOW-UP:  Please make an appointment with your surgeon as instructed. You do not need to follow up with anesthesia unless specifically instructed to do so.  WOUND CARE INSTRUCTIONS (if applicable):  Keep a dry clean dressing on the anesthesia/puncture wound site if there is drainage.  Once the wound has quit  draining you may leave it open to air.  Generally you should leave the bandage intact for twenty four hours unless there is drainage.  If the epidural site drains for more than 36-48 hours please call the anesthesia department.  QUESTIONS?:  Please feel free to call your physician or the hospital operator if you have any questions, and they will be happy to assist you.      Arthroscopic Procedure, Knee An arthroscopic procedure can find what is wrong with your knee. PROCEDURE Arthroscopy is a surgical technique that allows your orthopedic surgeon to diagnose and treat your knee injury with accuracy. They will look into your knee through a small instrument. This is almost like a small (pencil sized) telescope. Because arthroscopy affects your knee less than open knee surgery, you can anticipate a more rapid recovery. Taking an active role by following your caregiver's instructions will help with rapid and complete recovery. Use crutches, rest, elevation, ice, and knee exercises as instructed. The length of recovery depends on various factors including type of injury, age, physical condition, medical conditions, and your rehabilitation. Your knee is the joint between the large bones (femur and tibia) in your leg. Cartilage covers these bone ends which are smooth and slippery and allow your knee to bend and move smoothly. Two menisci, thick, semi-lunar shaped pads of cartilage which form a rim inside the joint, help absorb shock and stabilize your  knee. Ligaments bind the bones together and support your knee joint. Muscles move the joint, help support your knee, and take stress off the joint itself. Because of this all programs and physical therapy to rehabilitate an injured or repaired knee require rebuilding and strengthening your muscles. AFTER THE PROCEDURE  After the procedure, you will be moved to a recovery area until most of the effects of the medication have worn off. Your caregiver will discuss  the test results with you.  Only take over-the-counter or prescription medicines for pain, discomfort, or fever as directed by your caregiver. SEEK MEDICAL CARE IF:   You have increased bleeding from your wounds.  You see redness, swelling, or have increasing pain in your wounds.  You have pus coming from your wound.  You have an oral temperature above 102 F (38.9 C).  You notice a bad smell coming from the wound or dressing.  You have severe pain with any motion of your knee. SEEK IMMEDIATE MEDICAL CARE IF:   You develop a rash.  You have difficulty breathing.  You have any allergic problems. Document Released: 11/19/2000 Document Revised: 02/14/2012 Document Reviewed: 06/12/2008 [facility] Patient Information 2014 [place], [place].

## 2013-07-24 ENCOUNTER — Ambulatory Visit: Admit: 2013-07-24 | Payer: Self-pay | Admitting: Obstetrics and Gynecology

## 2013-07-24 SURGERY — HYSTERECTOMY, ABDOMINAL
Anesthesia: General

## 2013-07-25 NOTE — H&P (Signed)
[name] is an 72 y.o. female.   Chief Complaint: Tear left knee medial meniscus.   HPI: She has had pain in the left knee for several months.  She has had swelling, popping and giving way.  I saw her in the office on August 5th.  She had been on Tramadol and Celebrex.  I ordered a MRI of the left knee which was done on 07/17/13.  It showed a tear of the medial meniscus and fluid in the joint as well as DJD.  As she has not improved with conservative treatment, I recommended arthroscopy of the left knee.  She asked appropriate questions  I told her the risks and imponderables.  It is an elective procedure.  She asked to have it done 07/26/13 as an outpatient.  She understands she will need physical therapy after surgery for several weeks.  Past Medical History  Diagnosis Date  . Diabetes mellitus   . Asthma   . Chronic knee pain   . Back pain, chronic   . Hypertension   . Vertigo   . Hyperlipidemia   . TIA (transient ischemic attack)   . Anemia   . HOH (hard of hearing)   . Arthritis     Past Surgical History  Procedure Laterality Date  . Appendectomy    . Ovary surgery    . Cataract extraction w/phaco Left 01/16/2013    Procedure: CATARACT EXTRACTION PHACO AND INTRAOCULAR LENS PLACEMENT (IOC);  Surgeon: Loraine Leriche T. Nile Riggs, MD;  Location: [facility];  Service: Ophthalmology;  Laterality: Left;  CDE:14.37  . Endometrial biopsy  04/03/2013       . Hysteroscopy w/d&c N/A 04/24/2013    Procedure: SUCTION DILATATION AND CURETTAGE /HYSTEROSCOPY;  Surgeon: Tilda Burrow, MD;  Location: [facility];  Service: Gynecology;  Laterality: N/A;    Family History  Problem Relation Age of Onset  . Arrhythmia Father     Atrial fib/Pacemaker  . Diabetes Father   . Heart failure Mother   . Diabetes Mother   . Diabetes Sister    Social History:  reports that she has quit smoking. Her smoking use included Cigarettes. She has a 7.5 pack-year smoking history. She has never used smokeless tobacco. She  reports that she does not drink alcohol or use illicit drugs.  Allergies:  Allergies  Allergen Reactions  . Imitrex [Sumatriptan] Other (See Comments)    Unknown Reaction   . Mango Flavor     Nausea and vomiting  . Oysters [Shellfish Allergy]     Nausea and vomiting    No prescriptions prior to admission    No results found for this or any previous visit (from the past 48 hour(s)). No results found.  Review of Systems  Cardiovascular:       Atrial Fibrillation, heart disease  Musculoskeletal: Positive for joint pain (Pain left knee for several months with swelling, popping and giving way.).  Endo/Heme/Allergies:       Diabetes Mellitus    Last menstrual period 03/31/2013. Physical Exam  Constitutional: She is oriented to person, place, and time. She appears well-developed and well-nourished.  HENT:  Head: Normocephalic and atraumatic.  Eyes: Conjunctivae and EOM are normal. Pupils are equal, round, and reactive to light.  Neck: Normal range of motion. Neck supple.  Cardiovascular: Normal rate, normal heart sounds and intact distal pulses.   Respiratory: Effort normal.  GI: Soft.  Musculoskeletal: She exhibits tenderness (Pain left knee with effusion, ROM 0 to 105, crepitus, positive medial  [place].  The knee is stable.).  Neurological: She is alert and oriented to person, place, and time.  Skin: Skin is warm and dry.  Psychiatric: She has a normal mood and affect. Her behavior is normal. Judgment and thought content normal.     Assessment/Plan Left knee pain with degenerative joint disease and tear of the medial meniscus.  For arthroscopy of the left knee as an outpatient.  Daryle Boyington 07/25/2013, 3:04 PM

## 2013-07-26 ENCOUNTER — Encounter (HOSPITAL_COMMUNITY)
Admission: RE | Disposition: A | Discharge status: Home / Self Care | Payer: Self-pay | Source: Ambulatory Visit | Attending: Orthopaedic Surgery

## 2013-07-26 ENCOUNTER — Encounter (HOSPITAL_COMMUNITY): Payer: Self-pay | Admitting: *Deleted

## 2013-07-26 ENCOUNTER — Encounter (HOSPITAL_COMMUNITY): Payer: Self-pay | Admitting: Anesthesiology

## 2013-07-26 ENCOUNTER — Ambulatory Visit (HOSPITAL_COMMUNITY): Payer: Medicare Other | Admitting: Anesthesiology

## 2013-07-26 ENCOUNTER — Ambulatory Visit (HOSPITAL_COMMUNITY)
Admission: RE | Admit: 2013-07-26 | Discharge: 2013-07-26 | Disposition: A | Discharge status: Home / Self Care | Payer: Medicare Other | Source: Ambulatory Visit | Attending: Orthopaedic Surgery | Admitting: Orthopaedic Surgery

## 2013-07-26 DIAGNOSIS — M23319 Other meniscus derangements, anterior horn of medial meniscus, unspecified knee: Secondary | ICD-10-CM | POA: Insufficient documentation

## 2013-07-26 DIAGNOSIS — M1710 Unilateral primary osteoarthritis, unspecified knee: Secondary | ICD-10-CM | POA: Insufficient documentation

## 2013-07-26 DIAGNOSIS — D649 Anemia, unspecified: Secondary | ICD-10-CM | POA: Insufficient documentation

## 2013-07-26 DIAGNOSIS — E785 Hyperlipidemia, unspecified: Secondary | ICD-10-CM | POA: Insufficient documentation

## 2013-07-26 DIAGNOSIS — I4891 Unspecified atrial fibrillation: Secondary | ICD-10-CM | POA: Insufficient documentation

## 2013-07-26 DIAGNOSIS — E119 Type 2 diabetes mellitus without complications: Secondary | ICD-10-CM | POA: Insufficient documentation

## 2013-07-26 DIAGNOSIS — Z87891 Personal history of nicotine dependence: Secondary | ICD-10-CM | POA: Insufficient documentation

## 2013-07-26 DIAGNOSIS — I10 Essential (primary) hypertension: Secondary | ICD-10-CM | POA: Insufficient documentation

## 2013-07-26 DIAGNOSIS — J45909 Unspecified asthma, uncomplicated: Secondary | ICD-10-CM | POA: Insufficient documentation

## 2013-07-26 DIAGNOSIS — Z8673 Personal history of transient ischemic attack (TIA), and cerebral infarction without residual deficits: Secondary | ICD-10-CM | POA: Insufficient documentation

## 2013-07-26 DIAGNOSIS — M129 Arthropathy, unspecified: Secondary | ICD-10-CM | POA: Insufficient documentation

## 2013-07-26 DIAGNOSIS — Z79899 Other long term (current) drug therapy: Secondary | ICD-10-CM | POA: Insufficient documentation

## 2013-07-26 DIAGNOSIS — Z8249 Family history of ischemic heart disease and other diseases of the circulatory system: Secondary | ICD-10-CM | POA: Insufficient documentation

## 2013-07-26 HISTORY — PX: KNEE ARTHROSCOPY WITH MEDIAL MENISECTOMY: SHX5651

## 2013-07-26 LAB — URINALYSIS, ROUTINE W REFLEX MICROSCOPIC
Bilirubin Urine: NEGATIVE
Glucose, UA: NEGATIVE mg/dL
Hgb urine dipstick: NEGATIVE
Ketones, ur: NEGATIVE mg/dL
Leukocytes, UA: NEGATIVE
Nitrite: NEGATIVE
Protein, ur: NEGATIVE mg/dL
Specific Gravity, Urine: 1.02 (ref 1.005–1.030)
Urobilinogen, UA: 0.2 mg/dL (ref 0.0–1.0)
pH: 6 (ref 5.0–8.0)

## 2013-07-26 LAB — GLUCOSE, CAPILLARY
Glucose-Capillary: 106 mg/dL — ABNORMAL HIGH (ref 70–99)
Glucose-Capillary: 109 mg/dL — ABNORMAL HIGH (ref 70–99)

## 2013-07-26 SURGERY — ARTHROSCOPY, KNEE, WITH MEDIAL MENISCECTOMY
Anesthesia: General | Site: Knee | Laterality: Left | Wound class: Clean

## 2013-07-26 MED ORDER — LABETALOL HCL 5 MG/ML IV SOLN
INTRAVENOUS | Status: AC
  Filled 2013-07-26: qty 4

## 2013-07-26 MED ORDER — ONDANSETRON HCL 4 MG/2ML IJ SOLN
4.0000 mg | Freq: Once | INTRAMUSCULAR | Status: AC
  Administered 2013-07-26: 4 mg via INTRAVENOUS

## 2013-07-26 MED ORDER — DEXAMETHASONE SODIUM PHOSPHATE 4 MG/ML IJ SOLN
4.0000 mg | Freq: Once | INTRAMUSCULAR | Status: AC
  Administered 2013-07-26: 4 mg via INTRAVENOUS

## 2013-07-26 MED ORDER — ROCURONIUM BROMIDE 50 MG/5ML IV SOLN
INTRAVENOUS | Status: AC
  Filled 2013-07-26: qty 1

## 2013-07-26 MED ORDER — ONDANSETRON HCL 4 MG/2ML IJ SOLN
INTRAMUSCULAR | Status: AC
  Filled 2013-07-26: qty 2

## 2013-07-26 MED ORDER — PROPOFOL 10 MG/ML IV BOLUS
INTRAVENOUS | Status: DC | PRN
  Administered 2013-07-26: 50 mg via INTRAVENOUS
  Administered 2013-07-26: 150 mg via INTRAVENOUS
  Administered 2013-07-26: 50 mg via INTRAVENOUS

## 2013-07-26 MED ORDER — SUCCINYLCHOLINE CHLORIDE 20 MG/ML IJ SOLN
INTRAMUSCULAR | Status: AC
  Filled 2013-07-26: qty 1

## 2013-07-26 MED ORDER — LIDOCAINE HCL (PF) 1 % IJ SOLN
INTRAMUSCULAR | Status: AC
  Filled 2013-07-26: qty 5

## 2013-07-26 MED ORDER — PROPOFOL 10 MG/ML IV EMUL
INTRAVENOUS | Status: AC
  Filled 2013-07-26: qty 20

## 2013-07-26 MED ORDER — FENTANYL CITRATE 0.05 MG/ML IJ SOLN: 25.0000 ug | INTRAMUSCULAR | Status: DC | PRN

## 2013-07-26 MED ORDER — BUPIVACAINE HCL (PF) 0.25 % IJ SOLN
INTRAMUSCULAR | Status: AC
  Filled 2013-07-26: qty 30

## 2013-07-26 MED ORDER — GLYCOPYRROLATE 0.2 MG/ML IJ SOLN
INTRAMUSCULAR | Status: DC | PRN
  Administered 2013-07-26: 0.6 mg via INTRAVENOUS

## 2013-07-26 MED ORDER — ROCURONIUM BROMIDE 100 MG/10ML IV SOLN
INTRAVENOUS | Status: DC | PRN
  Administered 2013-07-26: 20 mg via INTRAVENOUS

## 2013-07-26 MED ORDER — ONDANSETRON HCL 4 MG/2ML IJ SOLN: 4.0000 mg | Freq: Once | INTRAMUSCULAR | Status: DC | PRN

## 2013-07-26 MED ORDER — LACTATED RINGERS IV SOLN: INTRAVENOUS | Status: DC

## 2013-07-26 MED ORDER — FENTANYL CITRATE 0.05 MG/ML IJ SOLN
INTRAMUSCULAR | Status: DC | PRN
  Administered 2013-07-26 (×3): 50 ug via INTRAVENOUS
  Administered 2013-07-26: 100 ug via INTRAVENOUS

## 2013-07-26 MED ORDER — DEXAMETHASONE SODIUM PHOSPHATE 4 MG/ML IJ SOLN
INTRAMUSCULAR | Status: AC
  Filled 2013-07-26: qty 1

## 2013-07-26 MED ORDER — LACTATED RINGERS IV SOLN
INTRAVENOUS | Status: DC | PRN
  Administered 2013-07-26: 1000 mL
  Administered 2013-07-26 (×2): via INTRAVENOUS

## 2013-07-26 MED ORDER — NEOSTIGMINE METHYLSULFATE 1 MG/ML IJ SOLN
INTRAMUSCULAR | Status: DC | PRN
  Administered 2013-07-26: 4 mg via INTRAVENOUS

## 2013-07-26 MED ORDER — MIDAZOLAM HCL 2 MG/2ML IJ SOLN
INTRAMUSCULAR | Status: AC
  Filled 2013-07-26: qty 2

## 2013-07-26 MED ORDER — MIDAZOLAM HCL 2 MG/2ML IJ SOLN
1.0000 mg | INTRAMUSCULAR | Status: DC | PRN
  Administered 2013-07-26: 2 mg via INTRAVENOUS

## 2013-07-26 MED ORDER — SODIUM CHLORIDE 0.9 % IR SOLN: Status: DC | PRN

## 2013-07-26 MED ORDER — GLYCOPYRROLATE 0.2 MG/ML IJ SOLN
INTRAMUSCULAR | Status: AC
  Filled 2013-07-26: qty 1

## 2013-07-26 MED ORDER — LACTATED RINGERS IR SOLN
Status: DC | PRN
  Administered 2013-07-26 (×2): 3000 mL

## 2013-07-26 MED ORDER — LABETALOL HCL 5 MG/ML IV SOLN: 10.0000 mg | INTRAVENOUS | Status: DC | PRN

## 2013-07-26 MED ORDER — ARTIFICIAL TEARS OP OINT
TOPICAL_OINTMENT | OPHTHALMIC | Status: AC
  Filled 2013-07-26: qty 3.5

## 2013-07-26 MED ORDER — LIDOCAINE HCL (CARDIAC) 20 MG/ML IV SOLN
INTRAVENOUS | Status: DC | PRN
  Administered 2013-07-26: 50 mg via INTRAVENOUS

## 2013-07-26 MED ORDER — GLYCOPYRROLATE 0.2 MG/ML IJ SOLN
INTRAMUSCULAR | Status: AC
  Filled 2013-07-26: qty 3

## 2013-07-26 MED ORDER — CHLORHEXIDINE GLUCONATE 4 % EX LIQD: 60.0000 mL | Freq: Once | CUTANEOUS | Status: DC

## 2013-07-26 MED ORDER — SUCCINYLCHOLINE CHLORIDE 20 MG/ML IJ SOLN
INTRAMUSCULAR | Status: DC | PRN
  Administered 2013-07-26: 120 mg via INTRAVENOUS

## 2013-07-26 MED ORDER — GLYCOPYRROLATE 0.2 MG/ML IJ SOLN
0.2000 mg | Freq: Once | INTRAMUSCULAR | Status: AC
  Administered 2013-07-26: 0.2 mg via INTRAVENOUS

## 2013-07-26 MED ORDER — BUPIVACAINE HCL (PF) 0.25 % IJ SOLN
INTRAMUSCULAR | Status: DC | PRN
  Administered 2013-07-26: 30 mL

## 2013-07-26 MED ORDER — FENTANYL CITRATE 0.05 MG/ML IJ SOLN
INTRAMUSCULAR | Status: AC
  Filled 2013-07-26: qty 5

## 2013-07-26 SURGICAL SUPPLY — 45 items
BAG HAMPER (MISCELLANEOUS) ×3 IMPLANT
BANDAGE ELASTIC 6 VELCRO NS (GAUZE/BANDAGES/DRESSINGS) ×3 IMPLANT
BANDAGE ESMARK 4X12 BL STRL LF (DISPOSABLE) ×2 IMPLANT
BLADE SURG 15 STRL LF DISP TIS (BLADE) ×2 IMPLANT
BLADE SURG 15 STRL SS (BLADE) ×1
BLADE SURG SZ11 CARB STEEL (BLADE) ×3 IMPLANT
BNDG ESMARK 4X12 BLUE STRL LF (DISPOSABLE) ×3
CLOTH BEACON ORANGE TIMEOUT ST (SAFETY) ×3 IMPLANT
CUFF TOURNIQUET SINGLE 44IN (TOURNIQUET CUFF) ×3 IMPLANT
CUTTER TOMCAT 4.0M (BURR) ×3 IMPLANT
DECANTER SPIKE VIAL GLASS SM (MISCELLANEOUS) ×3 IMPLANT
DRAPE PROXIMA HALF (DRAPES) ×3 IMPLANT
DRSG XEROFORM 1X8 (GAUZE/BANDAGES/DRESSINGS) ×3 IMPLANT
DURAPREP 26ML APPLICATOR (WOUND CARE) ×6 IMPLANT
FORMALIN 10 PREFIL 480ML (MISCELLANEOUS) ×3 IMPLANT
GAUZE SPONGE 4X4 16PLY XRAY LF (GAUZE/BANDAGES/DRESSINGS) ×3 IMPLANT
GLOVE BIO SURGEON STRL SZ8 (GLOVE) ×3 IMPLANT
GLOVE BIO SURGEON STRL SZ8.5 (GLOVE) ×3 IMPLANT
GLOVE BIOGEL PI IND STRL 7.0 (GLOVE) ×4 IMPLANT
GLOVE BIOGEL PI INDICATOR 7.0 (GLOVE) ×2
GLOVE ECLIPSE 6.5 STRL STRAW (GLOVE) ×3 IMPLANT
GLOVE SS BIOGEL STRL SZ 6.5 (GLOVE) ×2 IMPLANT
GLOVE SUPERSENSE BIOGEL SZ 6.5 (GLOVE) ×1
GOWN STRL REIN XL XLG (GOWN DISPOSABLE) ×6 IMPLANT
HLDR LEG FOAM (MISCELLANEOUS) ×2 IMPLANT
IV LACTATED RINGER IRRG 3000ML (IV SOLUTION) ×2
IV LR IRRIG 3000ML ARTHROMATIC (IV SOLUTION) ×4 IMPLANT
KIT BLADEGUARD II DBL (SET/KITS/TRAYS/PACK) ×3 IMPLANT
KIT ROOM TURNOVER AP CYSTO (KITS) ×3 IMPLANT
LEG HOLDER FOAM (MISCELLANEOUS) ×1
MANIFOLD NEPTUNE II (INSTRUMENTS) ×3 IMPLANT
MARKER SKIN DUAL TIP RULER LAB (MISCELLANEOUS) ×3 IMPLANT
NEEDLE HYPO 21X1.5 SAFETY (NEEDLE) ×3 IMPLANT
NEEDLE SPNL 18GX3.5 QUINCKE PK (NEEDLE) ×3 IMPLANT
PACK ARTHRO LIMB DRAPE STRL (MISCELLANEOUS) ×3 IMPLANT
PAD ABD 5X9 TENDERSORB (GAUZE/BANDAGES/DRESSINGS) ×3 IMPLANT
PAD ARMBOARD 7.5X6 YLW CONV (MISCELLANEOUS) ×3 IMPLANT
PADDING CAST COTTON 6X4 STRL (CAST SUPPLIES) ×3 IMPLANT
SET ARTHROSCOPY INST (INSTRUMENTS) ×3 IMPLANT
SET BASIN LINEN APH (SET/KITS/TRAYS/PACK) ×3 IMPLANT
SPONGE GAUZE 4X4 12PLY (GAUZE/BANDAGES/DRESSINGS) ×3 IMPLANT
SUT ETHILON 3 0 FSL (SUTURE) ×3 IMPLANT
SYR 30ML LL (SYRINGE) ×3 IMPLANT
TUBING FLOSTEADY ARTHRO PUMP (IRRIGATION / IRRIGATOR) ×3 IMPLANT
YANKAUER SUCT BULB TIP 10FT TU (MISCELLANEOUS) ×6 IMPLANT

## 2013-07-26 NOTE — Preoperative (Signed)
Beta Blockers   Reason not to administer Beta Blockers:Not Applicable 

## 2013-07-26 NOTE — Brief Op Note (Signed)
07/26/2013  8:29 AM  PATIENT:  [name]  72 y.o. female  PRE-OPERATIVE DIAGNOSIS:  tear medial meniscus left knee  POST-OPERATIVE DIAGNOSIS:  tear medial meniscus left knee  PROCEDURE:  Procedure(s): KNEE ARTHROSCOPY WITH PARTIAL MEDIAL MENISECTOMY (Left)  SURGEON:  Surgeon(s) and Role:    * Darreld Mclean, MD - Primary  PHYSICIAN ASSISTANT:   ASSISTANTS: none   ANESTHESIA:   general  EBL:  Total I/O In: 1000 [I.V.:1000] Out: -   BLOOD ADMINISTERED:none  DRAINS: none   LOCAL MEDICATIONS USED:  MARCAINE     SPECIMEN:  Source of Specimen:  left knee medial meniscus shavings  DISPOSITION OF SPECIMEN:  PATHOLOGY  COUNTS:  YES  TOURNIQUET:   Total Tourniquet Time Documented: Thigh (Left) - 22 minutes Total: Thigh (Left) - 22 minutes   DICTATION: .Other Dictation: Dictation Number [id number]  PLAN OF CARE: Discharge to home after PACU  PATIENT DISPOSITION:  PACU - hemodynamically stable.   Delay start of Pharmacological VTE agent (>24hrs) due to surgical blood loss or risk of bleeding: not applicable

## 2013-07-26 NOTE — Op Note (Signed)
[name]              ACCOUNT NO.:  [id number]  MEDICAL RECORD NO.:  [id number]  LOCATION:  [facility]                          FACILITY:  [facility]  PHYSICIAN:  J. Darreld Mclean, M.D. DATE OF BIRTH:  Apr 12, 1941  DATE OF PROCEDURE: DATE OF DISCHARGE:                              OPERATIVE REPORT   PREOPERATIVE DIAGNOSIS:  Tear medial meniscus of the left knee.  POSTOPERATIVE DIAGNOSIS:  Tear medial meniscus of the left knee.  PROCEDURE:  Operative arthroscopy, partial medial meniscectomy.  ANESTHESIA:  General.  TOURNIQUET TIME:  22 minutes.  No drains.  SURGEON:  J. Darreld Mclean, M.D.  INDICATIONS:  The patient's pain and tenderness in her left knee for several months.  It has been progressively worse.  She is giving way. MRI shows significant degenerative joint disease plus tear of the medial meniscus.  This patient has not improved with conservative treatment. Surgery for partial medial meniscectomy and arthroscopy was recommended. I went over the risk and imponderables of the procedure preoperatively and the patient appeared to understand and asked appropriate questions. She understands this is an elective procedure and physical therapy will be scheduled next week for her.  She also understands she has underlying arthritis that will not be changed by this procedure.  She is agreeable to having this done today.  DESCRIPTION OF PROCEDURE:  The patient was seen in the holding area. The left knee was identified as correct surgical site.  I placed a mark on the left knee.  She was brought to the operating room, given general anesthesia while supine.  Tourniquet and leg holder placed deflated left upper thigh.  She was prepped and draped in usual manner.  We had a generalized time-out identifying the patient as [name]. We are doing a left knee for arthroscopy medially.  All instrumentation was properly positioned and working.  The OR team knew each other.  Leg  was elevated, wrapped circumferentially with an Esmarch bandage. Tourniquet inflated to 300 mmHg.  Esmarch bandage removed.  Inflow cannula inserted medially and lactated Ringer's instilled into the knee by an infusion pump.  Arthroscope inserted laterally, the knee systematically examined.  Permanent pictures were taken.  The suprapatellar pouch, and some excess synovium grade 2-3 changes on the patellofemoral joint medially there were grade 3 changes on the distal femur and tibial plateau area and diffuse tear in the posterior horn of the medial meniscus.  Anterior cruciate was intact laterally.  She had some fraying of the lateral meniscus but no tear and there were grade 2 changes laterally of the femoral patellar joint.  No loose bodies were noted.  Attention was then brought back to the patient's medial side and using a meniscal probe and a meniscal punch and meniscal shaver.  Good smooth contour was obtained.  Permanent pictures were taken.  The knee was systematically examined.  No new pathology found.  Wound irrigated with main part of lactated Ringer's.  Wound was reapproximated using 3-0 nylon in an interrupted vertical mattress manner.  Marcaine 0.25% instilled to each portal.  Tourniquet deflated after 22 minutes. Sterile dressing applied.  Bulky dressing applied.  The patient tolerated the procedure well and will go  to recovery in good condition. Appropriate analgesic will be given for pain.  Physical therapy has been arranged.  See my office in approximately 10 days to 2 weeks.  If any difficulties, contact me through the office hospital beeper system.          ______________________________ [name]. Darreld Mclean, M.D.     JWK/MEDQ  D:  07/26/2013  T:  07/26/2013  Job:  [id number]

## 2013-07-26 NOTE — Anesthesia Procedure Notes (Signed)
Procedure Name: Intubation Date/Time: 07/26/2013 7:41 AM Performed by: Carolyne Littles, AMY L Pre-anesthesia Checklist: Patient identified, Patient being monitored, Timeout performed, Emergency Drugs available and Suction available Patient Re-evaluated:Patient Re-evaluated prior to inductionOxygen Delivery Method: Circle System Utilized Preoxygenation: Pre-oxygenation with 100% oxygen Intubation Type: IV induction, Rapid sequence and Cricoid Pressure applied Grade View: Grade I Tube type: Oral Tube size: 7.0 mm Number of attempts: 1 Airway Equipment and Method: stylet and Video-laryngoscopy Placement Confirmation: ETT inserted through vocal cords under direct vision,  positive ETCO2 and breath sounds checked- equal and bilateral Secured at: 21 cm Tube secured with: Tape Dental Injury: Teeth and Oropharynx as per pre-operative assessment  Difficulty Due To: Difficulty was anticipated

## 2013-07-26 NOTE — Progress Notes (Signed)
The History and Physical is unchanged. I have examined the patient. The patient is medically able to have surgery on the left knee . [name]  

## 2013-07-26 NOTE — Anesthesia Postprocedure Evaluation (Signed)
  Anesthesia Post-op Note  Patient: [name]  Procedure(s) Performed: Procedure(s): KNEE ARTHROSCOPY WITH PARTIAL MEDIAL MENISECTOMY (Left)  Patient Location: PACU  Anesthesia Type:General  Level of Consciousness: sedated and patient cooperative  Airway and Oxygen Therapy: Patient Spontanous Breathing and Patient connected to face mask oxygen  Post-op Pain: mild  Post-op Assessment: Post-op Vital signs reviewed, Patient's Cardiovascular Status Stable, Respiratory Function Stable, Patent Airway, No signs of Nausea or vomiting and Pain level controlled  Post-op Vital Signs: Reviewed and stable  Complications: No apparent anesthesia complications

## 2013-07-26 NOTE — Transfer of Care (Signed)
Immediate Anesthesia Transfer of Care Note  Patient: [name]  Procedure(s) Performed: Procedure(s): KNEE ARTHROSCOPY WITH PARTIAL MEDIAL MENISECTOMY (Left)  Patient Location: PACU  Anesthesia Type:General  Level of Consciousness: oriented, sedated and patient cooperative  Airway & Oxygen Therapy: Patient Spontanous Breathing and Patient connected to face mask oxygen  Post-op Assessment: Report given to PACU RN and Post -op Vital signs reviewed and stable  Post vital signs: Reviewed and stable  Complications: No apparent anesthesia complications

## 2013-07-26 NOTE — Anesthesia Preprocedure Evaluation (Signed)
Anesthesia Evaluation  Patient identified by MRN, date of birth, ID band Patient awake    Reviewed: Allergy & Precautions, H&P , NPO status , Patient's Chart, lab work & pertinent test results  Airway Mallampati: III TM Distance: >3 FB Neck ROM: Full    Dental  (+) Edentulous Upper   Pulmonary asthma , sleep apnea ,  breath sounds clear to auscultation  + decreased breath sounds      Cardiovascular hypertension, + dysrhythmias Atrial Fibrillation Rhythm:Regular Rate:Normal     Neuro/Psych TIA   GI/Hepatic   Endo/Other  diabetes, Well Controlled, Type obesity  Renal/GU      Musculoskeletal   Abdominal   Peds  Hematology   Anesthesia Other Findings   Reproductive/Obstetrics                           Anesthesia Physical Anesthesia Plan  ASA: III  Anesthesia Plan: General   Post-op Pain Management:    Induction: Intravenous, Rapid sequence and Cricoid pressure planned  Airway Management Planned: Oral ETT and Video Laryngoscope Planned  Additional Equipment:   Intra-op Plan:   Post-operative Plan: Extubation in OR  Informed Consent: I have reviewed the patients History and Physical, chart, labs and discussed the procedure including the risks, benefits and alternatives for the proposed anesthesia with the patient or authorized representative who has indicated his/her understanding and acceptance.     Plan Discussed with:   Anesthesia Plan Comments:         Anesthesia Quick Evaluation

## 2013-07-27 ENCOUNTER — Encounter (HOSPITAL_COMMUNITY): Payer: Self-pay | Admitting: Orthopaedic Surgery

## 2013-07-30 ENCOUNTER — Encounter: Payer: Medicare Other | Admitting: Obstetrics and Gynecology

## 2013-08-07 ENCOUNTER — Telehealth: Payer: Self-pay | Admitting: Obstetrics and Gynecology

## 2013-08-07 NOTE — Telephone Encounter (Signed)
Message copied by Tilda Burrow on Tue Aug 07, 2013  5:24 PM ------      Message from: De Blanch      Created: Fri Jun 01, 2013  7:17 AM      Regarding: RE: medical therapy limits in pt with "simple and complex endometrial hyperplasia " on D&C       Sharief Wainwright      I think continuing treatement with a progestin or a Mirena IUD makes sense and surgery could be deferred until later.      Dan      ----- Message -----         From: Tilda Burrow, MD         Sent: 05/30/2013   1:58 PM           To: Jeannette Corpus, MD, #      Subject: medical therapy limits in pt with "simple an#            [name], BMI 53 (5ft 0in, 269pounds), with medical comorbidities (DM, HTN) had PMB x 17 yrs, and endometrial biopsy, then D&C showed "simple and complex endometrial Hyperplasia", no identifiable cancer.        She has agressively lost weight , 291 to current wt 269 in 5 wks with exercise and diet changes.      1.Can I postpone hyst for til fall while she continues to improve her overall health by continued weight loss.       2. Should Aygestin or similar progestin be Rx'd? Or IUD?            [name]      [phone]             ------

## 2013-08-07 NOTE — Telephone Encounter (Signed)
Patient informed that she is a candidate for IUD use , to attempt to control endometrium, and postpone surgery to allow pt to focus on weight loss, making surgery safer. Pt to make appt 1 month.

## 2013-08-17 ENCOUNTER — Ambulatory Visit: Payer: Self-pay | Admitting: *Deleted

## 2013-08-17 DIAGNOSIS — G4730 Sleep apnea, unspecified: Secondary | ICD-10-CM

## 2013-08-17 DIAGNOSIS — S4360XA Sprain of unspecified sternoclavicular joint, initial encounter: Secondary | ICD-10-CM

## 2013-08-17 DIAGNOSIS — J329 Chronic sinusitis, unspecified: Secondary | ICD-10-CM

## 2013-08-17 DIAGNOSIS — D649 Anemia, unspecified: Secondary | ICD-10-CM

## 2013-08-17 DIAGNOSIS — R809 Proteinuria, unspecified: Secondary | ICD-10-CM

## 2013-08-17 DIAGNOSIS — E785 Hyperlipidemia, unspecified: Secondary | ICD-10-CM

## 2013-08-17 DIAGNOSIS — M1710 Unilateral primary osteoarthritis, unspecified knee: Secondary | ICD-10-CM

## 2013-08-17 DIAGNOSIS — I4891 Unspecified atrial fibrillation: Secondary | ICD-10-CM

## 2013-08-17 DIAGNOSIS — J45909 Unspecified asthma, uncomplicated: Secondary | ICD-10-CM

## 2013-08-17 DIAGNOSIS — E119 Type 2 diabetes mellitus without complications: Secondary | ICD-10-CM

## 2013-08-17 DIAGNOSIS — IMO0001 Reserved for inherently not codable concepts without codable children: Secondary | ICD-10-CM

## 2013-08-17 DIAGNOSIS — I10 Essential (primary) hypertension: Secondary | ICD-10-CM

## 2013-08-17 DIAGNOSIS — B029 Zoster without complications: Secondary | ICD-10-CM

## 2013-08-17 DIAGNOSIS — M67919 Unspecified disorder of synovium and tendon, unspecified shoulder: Secondary | ICD-10-CM

## 2013-08-17 DIAGNOSIS — R55 Syncope and collapse: Secondary | ICD-10-CM

## 2013-08-17 DIAGNOSIS — E7800 Pure hypercholesterolemia, unspecified: Secondary | ICD-10-CM

## 2013-09-07 ENCOUNTER — Ambulatory Visit: Payer: Medicare Other | Admitting: Obstetrics and Gynecology

## 2013-10-17 ENCOUNTER — Ambulatory Visit: Payer: Medicare Other | Admitting: Obstetrics and Gynecology

## 2013-11-16 ENCOUNTER — Telehealth: Payer: Self-pay

## 2013-11-16 NOTE — Telephone Encounter (Signed)
PT was originally referred by Dr. Felecia Shelling for screening colonoscopy in 12/2012. Her last colonoscopy was in 10/2008 at [facility] and next one due in 10/2013. She wants to wait until first of the year. ( I faxed Dr. Felecia Shelling and requested a new referral).   She is scheduled for OV with Tana Coast, PA on 12/24/2013 at 8:30 AM.

## 2013-12-17 ENCOUNTER — Ambulatory Visit: Payer: Medicare Other | Admitting: Obstetrics and Gynecology

## 2013-12-19 ENCOUNTER — Ambulatory Visit: Payer: Medicare Other | Admitting: Obstetrics and Gynecology

## 2013-12-24 ENCOUNTER — Telehealth: Payer: Self-pay | Admitting: Gastroenterology

## 2013-12-24 ENCOUNTER — Ambulatory Visit: Payer: Medicare Other | Admitting: Gastroenterology

## 2013-12-24 NOTE — Telephone Encounter (Signed)
Pt was a no show

## 2013-12-27 ENCOUNTER — Encounter: Payer: Self-pay | Admitting: Obstetrics and Gynecology

## 2013-12-27 ENCOUNTER — Ambulatory Visit (INDEPENDENT_AMBULATORY_CARE_PROVIDER_SITE_OTHER): Payer: Medicare HMO | Admitting: Obstetrics and Gynecology

## 2013-12-27 VITALS — BP 170/90 | Ht 60.0 in | Wt 282.8 lb

## 2013-12-27 DIAGNOSIS — N8502 Endometrial intraepithelial neoplasia [EIN]: Secondary | ICD-10-CM

## 2013-12-27 NOTE — Progress Notes (Signed)
   [facility] Visit  Patient name: [name] MRN [id number]  Date of birth: 11/05/1941  CC & HPI:  [name] is a 73 y.o. female presenting today for followup of endometrial hyperplasia with atypical simple and complex hyperplasia on full D&C. Case reviewed with Dr. Fermin Schwab, who feels pt can be followed with IUD and followup endometrial biopsy in 3-6 months and repeat 6 months afterwards.  Pt is status post knee surgery with slow recovery.   ROS:  No PMB  Pertinent History Reviewed:  Medical Hx:  Past Medical History  Diagnosis Date  . Diabetes mellitus   . Asthma   . Chronic knee pain   . Back pain, chronic   . Hypertension   . Vertigo   . Hyperlipidemia   . TIA (transient ischemic attack)   . Anemia   . HOH (hard of hearing)   . Arthritis     Surgical Hx:  Reviewed: Significant for   Past Surgical History  Procedure Laterality Date  . Appendectomy    . Ovary surgery    . Cataract extraction w/phaco Left 01/16/2013    Procedure: CATARACT EXTRACTION PHACO AND INTRAOCULAR LENS PLACEMENT (IOC);  Surgeon: Elta Guadeloupe T. Gershon Crane, MD;  Location: [facility];  Service: Ophthalmology;  Laterality: Left;  CDE:14.37  . Endometrial biopsy  04/03/2013       . Hysteroscopy w/d&c N/A 04/24/2013    Procedure: SUCTION DILATATION AND CURETTAGE /HYSTEROSCOPY;  Surgeon: Jonnie Kind, MD;  Location: [facility];  Service: Gynecology;  Laterality: N/A;  . Knee arthroscopy with medial menisectomy Left 07/26/2013    Procedure: KNEE ARTHROSCOPY WITH PARTIAL MEDIAL MENISECTOMY;  Surgeon: Sanjuana Kava, MD;  Location: [facility];  Service: Orthopedics;  Laterality: Left;    Medications: Reviewed & Updated - see associated section Social History: Reviewed -  reports that she has quit smoking. Her smoking use included Cigarettes. She has a 7.5 pack-year smoking history. She has never used smokeless tobacco.  Objective Findings:  Vitals: BP 170/90  Ht 5' (1.524 m)  Wt 282 lb 12.8 oz  (128.277 kg)  BMI 55.23 kg/m2  LMP 03/31/2013  Physical Examination: General appearance - alert, well appearing, and in no distress and overweight Mental status - alert, oriented to person, place, and time   Assessment & Plan:   A: simple and complex endometrial hyperplasia.      Morbid obesity P. IUD to be placed at return appt.      Endometrial biopsy 3 months p IUD   Plan: follow up in 2-4 weeks for IUD insertion

## 2013-12-27 NOTE — Patient Instructions (Signed)
Schedule IUD placement within a month.

## 2014-01-16 ENCOUNTER — Ambulatory Visit: Payer: Medicare HMO | Admitting: Obstetrics and Gynecology

## 2014-01-17 ENCOUNTER — Ambulatory Visit: Payer: Medicare HMO | Admitting: Obstetrics and Gynecology

## 2014-02-06 ENCOUNTER — Ambulatory Visit: Payer: Medicare HMO | Admitting: Obstetrics and Gynecology

## 2014-02-18 ENCOUNTER — Ambulatory Visit: Payer: Medicare HMO | Admitting: Obstetrics and Gynecology

## 2014-03-11 ENCOUNTER — Ambulatory Visit: Payer: Medicare HMO | Admitting: Obstetrics and Gynecology

## 2014-03-15 ENCOUNTER — Ambulatory Visit: Payer: Medicare HMO | Admitting: Obstetrics and Gynecology

## 2014-03-21 ENCOUNTER — Ambulatory Visit: Payer: Medicare HMO | Admitting: Obstetrics and Gynecology

## 2014-04-12 ENCOUNTER — Ambulatory Visit: Payer: Medicare HMO | Admitting: Obstetrics and Gynecology

## 2014-07-26 ENCOUNTER — Inpatient Hospital Stay (HOSPITAL_COMMUNITY)
Admission: EM | Admit: 2014-07-26 | Discharge: 2014-07-29 | DRG: 065 | Disposition: A | Discharge status: Home Health | Payer: Medicare HMO | Attending: Internal Medicine | Admitting: Internal Medicine

## 2014-07-26 ENCOUNTER — Encounter (HOSPITAL_COMMUNITY): Payer: Self-pay | Admitting: Emergency Medicine

## 2014-07-26 ENCOUNTER — Emergency Department (HOSPITAL_COMMUNITY): Payer: Medicare HMO

## 2014-07-26 DIAGNOSIS — E785 Hyperlipidemia, unspecified: Secondary | ICD-10-CM | POA: Diagnosis present

## 2014-07-26 DIAGNOSIS — Z8673 Personal history of transient ischemic attack (TIA), and cerebral infarction without residual deficits: Secondary | ICD-10-CM | POA: Diagnosis not present

## 2014-07-26 DIAGNOSIS — G9389 Other specified disorders of brain: Secondary | ICD-10-CM | POA: Diagnosis present

## 2014-07-26 DIAGNOSIS — M549 Dorsalgia, unspecified: Secondary | ICD-10-CM | POA: Diagnosis present

## 2014-07-26 DIAGNOSIS — G8929 Other chronic pain: Secondary | ICD-10-CM | POA: Diagnosis present

## 2014-07-26 DIAGNOSIS — M129 Arthropathy, unspecified: Secondary | ICD-10-CM | POA: Diagnosis present

## 2014-07-26 DIAGNOSIS — Z6841 Body Mass Index (BMI) 40.0 and over, adult: Secondary | ICD-10-CM | POA: Diagnosis not present

## 2014-07-26 DIAGNOSIS — M25569 Pain in unspecified knee: Secondary | ICD-10-CM | POA: Diagnosis present

## 2014-07-26 DIAGNOSIS — G4733 Obstructive sleep apnea (adult) (pediatric): Secondary | ICD-10-CM | POA: Diagnosis present

## 2014-07-26 DIAGNOSIS — Z87891 Personal history of nicotine dependence: Secondary | ICD-10-CM | POA: Diagnosis not present

## 2014-07-26 DIAGNOSIS — J45909 Unspecified asthma, uncomplicated: Secondary | ICD-10-CM | POA: Diagnosis present

## 2014-07-26 DIAGNOSIS — E119 Type 2 diabetes mellitus without complications: Secondary | ICD-10-CM | POA: Diagnosis present

## 2014-07-26 DIAGNOSIS — Z7982 Long term (current) use of aspirin: Secondary | ICD-10-CM | POA: Diagnosis not present

## 2014-07-26 DIAGNOSIS — IMO0001 Reserved for inherently not codable concepts without codable children: Secondary | ICD-10-CM

## 2014-07-26 DIAGNOSIS — E1165 Type 2 diabetes mellitus with hyperglycemia: Secondary | ICD-10-CM

## 2014-07-26 DIAGNOSIS — I10 Essential (primary) hypertension: Secondary | ICD-10-CM | POA: Diagnosis present

## 2014-07-26 DIAGNOSIS — I4891 Unspecified atrial fibrillation: Secondary | ICD-10-CM | POA: Diagnosis present

## 2014-07-26 DIAGNOSIS — R29898 Other symptoms and signs involving the musculoskeletal system: Secondary | ICD-10-CM | POA: Diagnosis present

## 2014-07-26 DIAGNOSIS — I482 Chronic atrial fibrillation, unspecified: Secondary | ICD-10-CM

## 2014-07-26 DIAGNOSIS — I6340 Cerebral infarction due to embolism of unspecified cerebral artery: Secondary | ICD-10-CM | POA: Diagnosis not present

## 2014-07-26 DIAGNOSIS — G939 Disorder of brain, unspecified: Secondary | ICD-10-CM | POA: Diagnosis present

## 2014-07-26 DIAGNOSIS — H9190 Unspecified hearing loss, unspecified ear: Secondary | ICD-10-CM | POA: Diagnosis present

## 2014-07-26 LAB — COMPREHENSIVE METABOLIC PANEL
ALT: 12 U/L (ref 0–35)
AST: 14 U/L (ref 0–37)
Albumin: 3 g/dL — ABNORMAL LOW (ref 3.5–5.2)
Alkaline Phosphatase: 66 U/L (ref 39–117)
Anion gap: 9 (ref 5–15)
BUN: 29 mg/dL — ABNORMAL HIGH (ref 6–23)
CO2: 30 mEq/L (ref 19–32)
Calcium: 9.2 mg/dL (ref 8.4–10.5)
Chloride: 105 mEq/L (ref 96–112)
Creatinine, Ser: 1.1 mg/dL (ref 0.50–1.10)
GFR calc Af Amer: 57 mL/min — ABNORMAL LOW (ref >90)
GFR calc non Af Amer: 49 mL/min — ABNORMAL LOW (ref >90)
Glucose, Bld: 148 mg/dL — ABNORMAL HIGH (ref 70–99)
Potassium: 4.6 mEq/L (ref 3.7–5.3)
Sodium: 144 mEq/L (ref 137–147)
Total Bilirubin: 0.3 mg/dL (ref 0.3–1.2)
Total Protein: 6.7 g/dL (ref 6.0–8.3)

## 2014-07-26 LAB — CBC WITH DIFFERENTIAL/PLATELET
Basophils Absolute: 0 10*3/uL (ref 0.0–0.1)
Basophils Relative: 0 % (ref 0–1)
Eosinophils Absolute: 0.1 10*3/uL (ref 0.0–0.7)
Eosinophils Relative: 2 % (ref 0–5)
HCT: 41.5 % (ref 36.0–46.0)
Hemoglobin: 13.9 g/dL (ref 12.0–15.0)
Lymphocytes Relative: 27 % (ref 12–46)
Lymphs Abs: 1.1 10*3/uL (ref 0.7–4.0)
MCH: 33.4 pg (ref 26.0–34.0)
MCHC: 33.5 g/dL (ref 30.0–36.0)
MCV: 99.8 fL (ref 78.0–100.0)
Monocytes Absolute: 0.4 10*3/uL (ref 0.1–1.0)
Monocytes Relative: 9 % (ref 3–12)
Neutro Abs: 2.6 10*3/uL (ref 1.7–7.7)
Neutrophils Relative %: 62 % (ref 43–77)
Platelets: 170 10*3/uL (ref 150–400)
RBC: 4.16 MIL/uL (ref 3.87–5.11)
RDW: 12.6 % (ref 11.5–15.5)
WBC: 4.1 10*3/uL (ref 4.0–10.5)

## 2014-07-26 LAB — CBG MONITORING, ED: Glucose-Capillary: 164 mg/dL — ABNORMAL HIGH (ref 70–99)

## 2014-07-26 LAB — PROTIME-INR
INR: 1 (ref 0.00–1.49)
Prothrombin Time: 13.2 seconds (ref 11.6–15.2)

## 2014-07-26 LAB — GLUCOSE, CAPILLARY: Glucose-Capillary: 170 mg/dL — ABNORMAL HIGH (ref 70–99)

## 2014-07-26 LAB — TROPONIN I: Troponin I: <0.3 ng/mL (ref <0.30)

## 2014-07-26 MED ORDER — TRAMADOL HCL 50 MG PO TABS
50.0000 mg | ORAL_TABLET | Freq: Two times a day (BID) | ORAL | Status: DC
  Administered 2014-07-26 – 2014-07-29 (×6): 50 mg via ORAL
  Filled 2014-07-26 (×6): qty 1

## 2014-07-26 MED ORDER — LEVALBUTEROL TARTRATE 45 MCG/ACT IN AERO: 2.0000 | INHALATION_SPRAY | Freq: Four times a day (QID) | RESPIRATORY_TRACT | Status: DC | PRN

## 2014-07-26 MED ORDER — SIMVASTATIN 20 MG PO TABS
20.0000 mg | ORAL_TABLET | Freq: Every day | ORAL | Status: DC
  Administered 2014-07-26 – 2014-07-28 (×3): 20 mg via ORAL
  Filled 2014-07-26 (×3): qty 1

## 2014-07-26 MED ORDER — SODIUM CHLORIDE 0.9 % IV SOLN
INTRAVENOUS | Status: DC
  Administered 2014-07-26: 1000 mL via INTRAVENOUS

## 2014-07-26 MED ORDER — STROKE: EARLY STAGES OF RECOVERY BOOK
Freq: Once | Status: DC
  Filled 2014-07-26: qty 1

## 2014-07-26 MED ORDER — NAPROXEN SODIUM 220 MG PO TABS: 220.0000 mg | ORAL_TABLET | Freq: Two times a day (BID) | ORAL | Status: DC | PRN

## 2014-07-26 MED ORDER — LEVALBUTEROL HCL 0.63 MG/3ML IN NEBU: 0.6300 mg | INHALATION_SOLUTION | Freq: Four times a day (QID) | RESPIRATORY_TRACT | Status: DC | PRN

## 2014-07-26 MED ORDER — DILTIAZEM HCL ER BEADS 120 MG PO CP24
120.0000 mg | ORAL_CAPSULE | Freq: Every day | ORAL | Status: DC
  Administered 2014-07-26 – 2014-07-29 (×4): 120 mg via ORAL
  Filled 2014-07-26 (×5): qty 1

## 2014-07-26 MED ORDER — ENOXAPARIN SODIUM 40 MG/0.4ML ~~LOC~~ SOLN
40.0000 mg | SUBCUTANEOUS | Status: DC
  Administered 2014-07-26 – 2014-07-28 (×3): 40 mg via SUBCUTANEOUS
  Filled 2014-07-26 (×3): qty 0.4

## 2014-07-26 MED ORDER — NAPROXEN 250 MG PO TABS: 250.0000 mg | ORAL_TABLET | Freq: Two times a day (BID) | ORAL | Status: DC | PRN

## 2014-07-26 MED ORDER — ASPIRIN 325 MG PO TABS
325.0000 mg | ORAL_TABLET | Freq: Every day | ORAL | Status: DC
  Administered 2014-07-26 – 2014-07-29 (×4): 325 mg via ORAL
  Filled 2014-07-26 (×4): qty 1

## 2014-07-26 NOTE — ED Provider Notes (Signed)
CSN: [id number]     Arrival date & time 07/26/14  1543 History   This chart was scribed for Veryl Speak, MD, by Neta Ehlers, ED Scribe. This patient was seen in room [facility] and the patient's care was started at 4:00 PM.  First MD Initiated Contact with Patient 07/26/14 1555     Chief Complaint  Patient presents with  . Extremity Weakness    The history is provided by the patient. No language interpreter was used.   HPI Comments: [name], with a h/o A-Fib,  is a 73 y.o. female who presents to the Emergency Department complaining of sudden-onset weakness to her left leg which began yesterday evening at church. The pt also reports intermittent tremors to her left leg which have since resolved. She also endorses bilateral visual disturbances which she characterizes as "spider webs across my eyes." Her daughter states that yesterday the pt also exhibited delayed responses when talking; this has also resolved. [name] denies weakness to her arms. She also denies a headache or lower extremity swelling. The pt states that last year she was taken off coumadin due to abnormal vaginal bleeding; the problem is resolved. However, [name] states last week her doctor informed her she should be placed on coumadin again. The pt is a former smoker.   Dr. Legrand Rams is her PCP. She is not followed by a cardiologist.   Past Medical History  Diagnosis Date  . Diabetes mellitus   . Asthma   . Chronic knee pain   . Back pain, chronic   . Hypertension   . Vertigo   . Hyperlipidemia   . TIA (transient ischemic attack)   . Anemia   . HOH (hard of hearing)   . Arthritis    Past Surgical History  Procedure Laterality Date  . Appendectomy    . Ovary surgery    . Cataract extraction w/phaco Left 01/16/2013    Procedure: CATARACT EXTRACTION PHACO AND INTRAOCULAR LENS PLACEMENT (IOC);  Surgeon: Elta Guadeloupe T. Gershon Crane, MD;  Location: [facility];  Service: Ophthalmology;  Laterality: Left;  CDE:14.37  .  Endometrial biopsy  04/03/2013       . Hysteroscopy w/d&c N/A 04/24/2013    Procedure: SUCTION DILATATION AND CURETTAGE /HYSTEROSCOPY;  Surgeon: Jonnie Kind, MD;  Location: [facility];  Service: Gynecology;  Laterality: N/A;  . Knee arthroscopy with medial menisectomy Left 07/26/2013    Procedure: KNEE ARTHROSCOPY WITH PARTIAL MEDIAL MENISECTOMY;  Surgeon: Sanjuana Kava, MD;  Location: [facility];  Service: Orthopedics;  Laterality: Left;   Family History  Problem Relation Age of Onset  . Arrhythmia Father     Atrial fib/Pacemaker  . Diabetes Father   . Heart failure Mother   . Diabetes Mother   . Diabetes Sister    History  Substance Use Topics  . Smoking status: Former Smoker -- 0.25 packs/day for 30 years    Types: Cigarettes  . Smokeless tobacco: Never Used  . Alcohol Use: No   No OB history provided.   Review of Systems  Constitutional: Negative for fever.  Eyes: Positive for visual disturbance.  Cardiovascular: Negative for leg swelling.  Neurological: Positive for speech difficulty and weakness. Negative for headaches.  All other systems reviewed and are negative.   Allergies  Imitrex; Mango flavor; and Oysters  Home Medications   Prior to Admission medications   Medication Sig Start Date End Date Taking? Authorizing Provider  aspirin EC 81 MG tablet Take 81 mg by mouth every  morning.     Historical Provider, MD  cholecalciferol (VITAMIN D) 1000 UNITS tablet Take 1,000 Units by mouth every morning.     Historical Provider, MD  levalbuterol [facility] HFA) 45 MCG/ACT inhaler Inhale 2 puffs into the lungs every 6 (six) hours as needed for wheezing or shortness of breath. 03/13/13   Rosita Fire, MD  naproxen sodium (ALEVE) 220 MG tablet Take 220 mg by mouth 2 (two) times daily as needed.    Historical Provider, MD  simvastatin (ZOCOR) 20 MG tablet Take 20 mg by mouth at bedtime.     Historical Provider, MD  traMADol Veatrice Bourbon) 50 MG tablet  07/15/14   Historical Provider, MD   traMADol-acetaminophen (ULTRACET) 37.5-325 MG per tablet Take 1 tablet by mouth every 6 (six) hours as needed for pain.    Historical Provider, MD   Triage Vitals: Pulse 86  Temp(Src) 98 F (36.7 C) (Oral)  Resp 20  Ht 5' (1.524 m)  Wt 282 lb (127.914 kg)  BMI 55.07 kg/m2  SpO2 98%  LMP 03/31/2013  Physical Exam  Nursing note and vitals reviewed. Constitutional: She is oriented to person, place, and time. She appears well-developed and well-nourished. No distress.  HENT:  Head: Normocephalic and atraumatic.  Eyes: Conjunctivae and EOM are normal. Pupils are equal, round, and reactive to light.  No papilledema or fundoscopic abnormalities noted.   Neck: Neck supple. No tracheal deviation present.  Cardiovascular: Normal rate.   Pulmonary/Chest: Effort normal. No respiratory distress.  Musculoskeletal: Normal range of motion.  Neurological: She is alert and oriented to person, place, and time. No cranial nerve deficit. She exhibits normal muscle tone. Coordination normal.  Skin: Skin is warm and dry.  Psychiatric: She has a normal mood and affect. Her behavior is normal.    ED Course  Procedures (including critical care time)  DIAGNOSTIC STUDIES: Oxygen Saturation is 98% on room air, normal by my interpretation.    COORDINATION OF CARE:  4:09 PM- Discussed treatment plan with patient, and the patient agreed to the plan. The plan includes lab work and a CT.   Labs Review Labs Reviewed  COMPREHENSIVE METABOLIC PANEL - Abnormal; Notable for the following:    Glucose, Bld 148 (*)    BUN 29 (*)    Albumin 3.0 (*)    GFR calc non Af Amer 49 (*)    GFR calc Af Amer 57 (*)    All other components within normal limits  CBG MONITORING, ED - Abnormal; Notable for the following:    Glucose-Capillary 164 (*)    All other components within normal limits  CBC WITH DIFFERENTIAL  TROPONIN I  PROTIME-INR    Imaging Review Ct Head (brain) Wo Contrast  07/26/2014   CLINICAL DATA:   Leg weakness, visual disturbance  EXAM: CT HEAD WITHOUT CONTRAST  TECHNIQUE: Contiguous axial images were obtained from the base of the skull through the vertex without intravenous contrast.  COMPARISON:  03/21/2013  FINDINGS: No skull fracture is noted. There is mucosal thickening bilateral ethmoid air cells. The mastoid air cells are unremarkable. No intracranial hemorrhage, mass effect or midline shift. Again noted cerebral atrophy. Patchy periventricular and subcortical white matter decreased attenuation consistent with chronic small vessel ischemic changes again noted. There is new area of decreased attenuation in right frontal lobe white matter measures about 1.5 cm. This is highly suspicious for evolving white matter ischemia. Clinical correlation is necessary. Further evaluation with MRI is recommended. Please see image 15. No mass lesion  is noted on this unenhanced scan.  IMPRESSION: 1. Again noted cerebral atrophy and chronic white matter disease. There is new area of decreased attenuation in right frontal lobe white matter measures about 1.5 cm. This is highly suspicious for recent or evolving ischemia. Clinical correlation is necessary. Further evaluation with MRI is recommended as clinically warranted.   Electronically Signed   By: Lahoma Crocker M.D.   On: 07/26/2014 16:23     EKG Interpretation   Date/Time:  Friday July 26 2014 16:29:19 EDT Ventricular Rate:  82 PR Interval:    QRS Duration: 80 QT Interval:  409 QTC Calculation: 478 R Axis:   70 Text Interpretation:  Atrial fibrillation Borderline prolonged QT interval  Confirmed by Beau Fanny  MD, Vayda Dungee ([id number]) on 07/26/2014 4:49:25 PM      MDM   Final diagnoses:  None    Patient is a 73 year old female who presents with weakness in her left leg and visual disturbances that started yesterday evening. Her neurologic exam is nonfocal. Workup today reveals evidence for a small right-sided frontal lobe CVA. I have consult with the  hospitalist service who will admit the patient.  I personally performed the services described in this documentation, which was scribed in my presence. The recorded information has been reviewed and is accurate.       Veryl Speak, MD 07/26/14 [phone]

## 2014-07-26 NOTE — H&P (Signed)
History and Physical  [name] GHW:[id number] DOB: 10-16-1941 DOA: 07/26/2014  Referring physician: Dr Stark Jock PCP: Rosita Fire, MD   Chief Complaint: Left leg weakness  HPI: [name] is a 73 y.o. female history of type II diet controlled diabetes, hypertension, asthma, atrial fibrillation not on Coumadin, hyperlipidemia. Patient presents to the hospital with a 24 hour history of left leg weakness and shaking that started at church. On the way home from church the daughter noticed that she was less responsive and had difficulty answering questions. While her responsiveness has improved since yesterday, she continues to have some left leg weakness and was brought to the emergency department for evaluation.  The patient does have atrial fibrillation and used to be on Coumadin until she had an episode of dysfunctional uterine bleeding. She subsequently was taken off from Coumadin and had a hysterectomy. She was never put back on Coumadin.    Review of Systems:  Patient complains of knee pain, which is chronic in nature  Pt denies any headache, vision changes, slurred speech, facial droop, weakness of upper or lower extremities, abdominal pain, chest pain, chest pressure, shortness of breath.  Review of systems are otherwise negative  Past Medical History  Diagnosis Date  . Diabetes mellitus   . Asthma   . Chronic knee pain   . Back pain, chronic   . Hypertension   . Vertigo   . Hyperlipidemia   . TIA (transient ischemic attack)   . Anemia   . HOH (hard of hearing)   . Arthritis    Past Surgical History  Procedure Laterality Date  . Appendectomy    . Ovary surgery    . Cataract extraction w/phaco Left 01/16/2013    Procedure: CATARACT EXTRACTION PHACO AND INTRAOCULAR LENS PLACEMENT (IOC);  Surgeon: Elta Guadeloupe T. Gershon Crane, MD;  Location: [facility];  Service: Ophthalmology;  Laterality: Left;  CDE:14.37  . Endometrial biopsy  04/03/2013       . Hysteroscopy w/d&c N/A 04/24/2013    Procedure: SUCTION DILATATION AND CURETTAGE /HYSTEROSCOPY;  Surgeon: Jonnie Kind, MD;  Location: [facility];  Service: Gynecology;  Laterality: N/A;  . Knee arthroscopy with medial menisectomy Left 07/26/2013    Procedure: KNEE ARTHROSCOPY WITH PARTIAL MEDIAL MENISECTOMY;  Surgeon: Sanjuana Kava, MD;  Location: [facility];  Service: Orthopedics;  Laterality: Left;   Social History:  reports that she has quit smoking. Her smoking use included Cigarettes. She has a 7.5 pack-year smoking history. She has never used smokeless tobacco. She reports that she does not drink alcohol or use illicit drugs. Patient lives at home & is able to participate in activities of daily living without assistance  Allergies  Allergen Reactions  . Imitrex [Sumatriptan] Other (See Comments)    Unknown Reaction   . Mango Flavor     Nausea and vomiting  . Oysters [Shellfish Allergy]     Nausea and vomiting    Family History  Problem Relation Age of Onset  . Arrhythmia Father     Atrial fib/Pacemaker  . Diabetes Father   . Heart failure Mother   . Diabetes Mother   . Diabetes Sister      Prior to Admission medications   Medication Sig Start Date End Date Taking? Authorizing Provider  aspirin EC 81 MG tablet Take 81 mg by mouth every morning.    Yes Historical Provider, MD  cholecalciferol (VITAMIN D) 1000 UNITS tablet Take 1,000 Units by mouth every morning.    Yes Historical Provider, MD  diltiazem (TIAZAC) 120 MG 24 hr capsule Take 120 mg by mouth daily.   Yes Historical Provider, MD  levalbuterol [facility] HFA) 45 MCG/ACT inhaler Inhale 2 puffs into the lungs every 6 (six) hours as needed for wheezing or shortness of breath. 03/13/13  Yes Rosita Fire, MD  naproxen sodium (ALEVE) 220 MG tablet Take 220 mg by mouth 2 (two) times daily as needed (for pain).    Yes Historical Provider, MD  simvastatin (ZOCOR) 20 MG tablet Take 20 mg by mouth at bedtime.    Yes Historical Provider, MD  traMADol (ULTRAM) 50 MG tablet  Take 50 mg by mouth 2 (two) times daily.  07/15/14  Yes Historical Provider, MD    Physical Exam: BP 147/85  Pulse 69  Temp(Src) 98 F (36.7 C) (Oral)  Resp 21  Ht 5' (1.524 m)  Wt 127.914 kg (282 lb)  BMI 55.07 kg/m2  SpO2 100%  LMP 03/31/2013  General:  In general this is a black awake female who is awake alert and oriented x3. There is no acute cardiopulmonary distress. Eyes: Pupils equal, round, reactive to light. Extraocular muscles are intact. Sclerae anicteric and noninjected. ENT: External auditory canals are patent and tympanic membranes reflect complaint. Mucous membranes are moist. Neck: Supple without  lymphadenopathy. No carotid bruit. No masses palpated. Cardiovascular: Very early a regular rate. No murmurs, rubs, gallops. No jugular venous distention. Respiratory: Good respiratory effort. Lungs clear auscultation bilaterally with no wheezes, rales, rhonchi. Abdomen: Obese, soft, nontender, nondistended. Bowel sounds present. No masses or hepatosplenomegaly. Skin: Warm to touch Musculoskeletal: Knees tender to palpation Psychiatric: Appropriate insight and judgment Neurologic: Cranial nerves II through XII are grossly intact. Speech, coordination, associations are intact. Strength in upper and lower extremities are 5 out of 5 bilaterally. Sensation intact to light touch.           Labs on Admission:  Basic Metabolic Panel:  Recent Labs Lab 07/26/14 1631  NA 144  K 4.6  CL 105  CO2 30  GLUCOSE 148*  BUN 29*  CREATININE 1.10  CALCIUM 9.2   Liver Function Tests:  Recent Labs Lab 07/26/14 1631  AST 14  ALT 12  ALKPHOS 66  BILITOT 0.3  PROT 6.7  ALBUMIN 3.0*   No results found for this basename: LIPASE, AMYLASE,  in the last 168 hours No results found for this basename: AMMONIA,  in the last 168 hours CBC:  Recent Labs Lab 07/26/14 1631  WBC 4.1  NEUTROABS 2.6  HGB 13.9  HCT 41.5  MCV 99.8  PLT 170   Cardiac Enzymes:  Recent Labs Lab  07/26/14 1631  TROPONINI <0.30    BNP (last 3 results) No results found for this basename: PROBNP,  in the last 8760 hours CBG:  Recent Labs Lab 07/26/14 1649  GLUCAP 164*    Radiological Exams on Admission: Ct Head (brain) Wo Contrast  07/26/2014   CLINICAL DATA:  Leg weakness, visual disturbance  EXAM: CT HEAD WITHOUT CONTRAST  TECHNIQUE: Contiguous axial images were obtained from the base of the skull through the vertex without intravenous contrast.  COMPARISON:  03/21/2013  FINDINGS: No skull fracture is noted. There is mucosal thickening bilateral ethmoid air cells. The mastoid air cells are unremarkable. No intracranial hemorrhage, mass effect or midline shift. Again noted cerebral atrophy. Patchy periventricular and subcortical white matter decreased attenuation consistent with chronic small vessel ischemic changes again noted. There is new area of decreased attenuation in right frontal lobe white matter measures  about 1.5 cm. This is highly suspicious for evolving white matter ischemia. Clinical correlation is necessary. Further evaluation with MRI is recommended. Please see image 15. No mass lesion is noted on this unenhanced scan.  IMPRESSION: 1. Again noted cerebral atrophy and chronic white matter disease. There is new area of decreased attenuation in right frontal lobe white matter measures about 1.5 cm. This is highly suspicious for recent or evolving ischemia. Clinical correlation is necessary. Further evaluation with MRI is recommended as clinically warranted.   Electronically Signed   By: Lahoma Crocker M.D.   On: 07/26/2014 16:23    EKG: Independently reviewed. Atrial fibrillation with a rate of 60-70. QRS complex is normal. Slightly prolonged QTc at 0.470. No acute ST changes.  Assessment/Plan Present on Admission:  . Left leg weakness . Right frontal lobe lesion  #1 left leg weakness #2 right frontal lobe lesion - suspicious for ischemic stroke With the left leg weakness  and no right frontal lobe lesion, there is extreme concern for stroke stroke. Given that, MRI is indicated. We'll transfer the patient to [facility] in order for the patient to get the MRI. We'll also arrange for an echocardiogram and for carotid Dopplers. We'll continue the patient on full dose aspirin. Should the patient be found to have an ischemic stroke, the patient may need to be on Plavix for secondary prevention.  #3 atrial fibrillation Start the patient on Lovenox Patient is currently rate controlled on Cardizem  #4 diabetes - diet controlled We'll continue with diet control.   #5 hypertension Continue Cardizem  Consultants: Neurology  Code Status: Full  Family Communication: Daughter and son   Disposition Plan: Pending MRI  Time spent: 63 minutes  [place], [place] [facility] Pager [phone]  **Disclaimer: This note may have been dictated with voice recognition software. Similar sounding words can inadvertently be transcribed and this note may contain transcription errors which may not have been corrected upon publication of note.**

## 2014-07-26 NOTE — ED Notes (Signed)
Reports weakness and tremors to left leg starting around 1900 last night while at church.  Reports "seeing spider webs" with vision today.

## 2014-07-27 ENCOUNTER — Inpatient Hospital Stay (HOSPITAL_COMMUNITY): Payer: Medicare HMO

## 2014-07-27 DIAGNOSIS — R29898 Other symptoms and signs involving the musculoskeletal system: Secondary | ICD-10-CM

## 2014-07-27 DIAGNOSIS — IMO0001 Reserved for inherently not codable concepts without codable children: Secondary | ICD-10-CM

## 2014-07-27 DIAGNOSIS — I6340 Cerebral infarction due to embolism of unspecified cerebral artery: Principal | ICD-10-CM

## 2014-07-27 DIAGNOSIS — I4891 Unspecified atrial fibrillation: Secondary | ICD-10-CM

## 2014-07-27 DIAGNOSIS — G459 Transient cerebral ischemic attack, unspecified: Secondary | ICD-10-CM

## 2014-07-27 DIAGNOSIS — E1165 Type 2 diabetes mellitus with hyperglycemia: Secondary | ICD-10-CM

## 2014-07-27 LAB — LIPID PANEL
Cholesterol: 173 mg/dL (ref 0–200)
HDL: 66 mg/dL (ref >39)
LDL Cholesterol: 92 mg/dL (ref 0–99)
Total CHOL/HDL Ratio: 2.6 RATIO
Triglycerides: 74 mg/dL (ref <150)
VLDL: 15 mg/dL (ref 0–40)

## 2014-07-27 LAB — GLUCOSE, CAPILLARY
Glucose-Capillary: 150 mg/dL — ABNORMAL HIGH (ref 70–99)
Glucose-Capillary: 119 mg/dL — ABNORMAL HIGH (ref 70–99)
Glucose-Capillary: 93 mg/dL (ref 70–99)
Glucose-Capillary: 128 mg/dL — ABNORMAL HIGH (ref 70–99)

## 2014-07-27 LAB — RAPID URINE DRUG SCREEN, HOSP PERFORMED
Amphetamines: NOT DETECTED
Barbiturates: NOT DETECTED
Benzodiazepines: NOT DETECTED
Cocaine: NOT DETECTED
Opiates: NOT DETECTED
Tetrahydrocannabinol: NOT DETECTED

## 2014-07-27 LAB — HEMOGLOBIN A1C
Hgb A1c MFr Bld: 6.2 % — ABNORMAL HIGH (ref <5.7)
Mean Plasma Glucose: 131 mg/dL — ABNORMAL HIGH (ref <117)

## 2014-07-27 NOTE — Evaluation (Signed)
Occupational Therapy Evaluation Patient Details Name: [name] MRN: [id number] DOB: [birth year] Today's Date: 07/27/2014    History of Present Illness 73 y.o. female with a history of diabetes, hypertension, hyperlipidemia, previous stroke/TIA, and atrial fibrillation. Admitted with LLE weakness/confusion. MRI revealed Acute nonhemorrhagic right anterior cerebral artery infarct.   Clinical Impression   Pt admitted with above. Pt presents with apparent visual and cognitive deficits. Education provided to pt and family during session. Feel pt will benefit from acute OT to address deficits prior to d/c. Recommending Outpatient OT for followup.     Follow Up Recommendations  Outpatient OT;Supervision/Assistance - 24 hour    Equipment Recommendations  None recommended by OT    Recommendations for Other Services       Precautions / Restrictions Precautions Precautions: Fall Restrictions Weight Bearing Restrictions: No      Mobility Bed Mobility               General bed mobility comments: not assessed  Transfers Overall transfer level: Needs assistance   Transfers: Sit to/from Stand Sit to Stand: Min guard              Balance                                            ADL Overall ADL's : Needs assistance/impaired     Grooming: Wash/dry hands;Set up;Standing;Supervision/safety               Lower Body Dressing: Min guard;Sit to/from stand   Toilet Transfer: Min guard;Comfort height toilet;Grab bars   Toileting- Water quality scientist and Hygiene: Supervision/safety;Sit to/from stand       Functional mobility during ADLs: Min guard General ADL Comments: Educated on BE FAST stroke education to pt and family and recommended to avoid canned foods due to increased sodium. Educated on AE for LB ADLs. Pt practiced with sockaid (able to don without but it was difficult). Ambulated in hallway-decreased activity tolerance. Educated on  deep breathing technique. Recommended sitting for LB ADLs.      Vision  Pt wears glasses all the time  Pt reports seeing spider webs in left visual field at times; pt seeing double without glasses  Visual fields: Inconsistent     Tracking/Visual Pursuits: Other (comment) (difficult to assess-looking around; difficult in inferior quadrants)   Convergence: Within functional limits         Perception     Praxis      Pertinent Vitals/Pain Pain Assessment: No/denies pain     Hand Dominance     Extremity/Trunk Assessment Upper Extremity Assessment Upper Extremity Assessment: Overall WFL for tasks assessed   Lower Extremity Assessment Lower Extremity Assessment: Defer to PT evaluation       Communication Communication Communication: No difficulties   Cognition Arousal/Alertness: Awake/alert Behavior During Therapy: WFL for tasks assessed/performed Overall Cognitive Status: Impaired/Different from baseline Area of Impairment: Attention;Problem solving;Following commands   Current Attention Level: Selective   Following Commands: Follows one step commands inconsistently     Problem Solving: Slow processing General Comments: Pt seems to be hiding cognitive deficits with humor.   General Comments       Exercises       Shoulder Instructions      Home Living Family/patient expects to be discharged to:: Private residence Living Arrangements: Alone Available Help at Discharge: Family;Available 24 hours/day (family states they  can work out this) Type of Home: [facility] Access: Stairs to enter Technical brewer of Steps: 3 Entrance Stairs-Rails: Left Home Layout: One level     Bathroom Shower/Tub: Teacher, early years/pre: Standard (sink close)     Home Equipment: Cane - single point;Shower seat (shower seat doesn't fit in tub)          Prior Functioning/Environment Level of Independence: Independent with assistive device(s)              OT Diagnosis: Cognitive deficits;Disturbance of vision   OT Problem List: Impaired vision/perception;Decreased cognition;Decreased knowledge of use of DME or AE;Decreased knowledge of precautions;Decreased activity tolerance   OT Treatment/Interventions: Self-care/ADL training;DME and/or AE instruction;Therapeutic exercise;Visual/perceptual remediation/compensation;Patient/family education;Balance training;Cognitive remediation/compensation;Therapeutic activities    OT Goals(Current goals can be found in the care plan section) Acute Rehab OT Goals Patient Stated Goal: not stated OT Goal Formulation: With patient Time For Goal Achievement: 08/03/14 Potential to Achieve Goals: Good ADL Goals Pt Will Perform Lower Body Dressing: with modified independence;sit to/from stand Pt Will Transfer to Toilet: with modified independence;ambulating Pt Will Perform Tub/Shower Transfer: Tub transfer;with supervision;ambulating (shower equipment tbd) Additional ADL Goal #1: Pt will participate in further vision assessment.  OT Frequency: Min 2X/week   Barriers to D/C:            Co-evaluation              End of Session Equipment Utilized During Treatment: Gait belt  Activity Tolerance: Patient tolerated treatment well Patient left: in chair;with call bell/phone within reach;with chair alarm set;with family/visitor present   Time: 6734-1937 OT Time Calculation (min): 38 min Charges:  OT General Charges $OT Visit: 1 Procedure OT Evaluation $Initial OT Evaluation Tier I: 1 Procedure OT Treatments $Therapeutic Activity: 8-22 mins G-CodesBenito Mccreedy OTR/L [phone] 07/27/2014, 3:52 PM

## 2014-07-27 NOTE — Consult Note (Signed)
Referring Physician: Dr Karleen Hampshire    Chief Complaint: Left lower extremity weakness and altered mental status  HPI: [name] is a 73 y.o. female with a history of diabetes, hypertension, hyperlipidemia, previous stroke/TIA, and atrial fibrillation. The patient was on Coumadin in the past however this was discontinued approximately one year ago secondary to abnormal uterine bleeding. She has been on aspirin 81 mg daily since that time. The patient was on her way to church with her daughter Thursday evening, 07/25/2014, when her daughter noticed that she was moving slower than normal, was not very talkative, and appeared to be mildly confused. The patient is normally mentally sharp although she has had some mild memory problems over the past couple of years. On Friday other family members noted left lower extremity weakness with a left lower facial droop, and the patient was brought to the emergency department for further evaluation and subsequent admission. Prior to admission the patient was fairly independent and ambulated with a cane occasionally secondary to knee pain. There is no history of falls.  An MRI today revealed an acute nonhemorrhagic right anterior cerebral artery infarct. An MRA showed ccclusion of the right A2 segment due to embolus or thrombus, as well as, severe stenosis right middle cerebral artery.   Neurology was asked to see the patient. The patient's daughter and son-in-law were in the room. The daughter feels that her mother has improved; however, her mental status is still not back to baseline.   Date last known well: Date: 07/24/2014 Time last known well: Unable to determine tPA Given: No: - Late arrival  Past Medical History  Diagnosis Date  . Diabetes mellitus   . Asthma   . Chronic knee pain   . Back pain, chronic   . Hypertension   . Vertigo   . Hyperlipidemia   . TIA (transient ischemic attack)   . Anemia   . HOH (hard of hearing)   . Arthritis     Past  Surgical History  Procedure Laterality Date  . Appendectomy    . Ovary surgery    . Cataract extraction w/phaco Left 01/16/2013    Procedure: CATARACT EXTRACTION PHACO AND INTRAOCULAR LENS PLACEMENT (IOC);  Surgeon: Elta Guadeloupe T. Gershon Crane, MD;  Location: [facility];  Service: Ophthalmology;  Laterality: Left;  CDE:14.37  . Endometrial biopsy  04/03/2013       . Hysteroscopy w/d&c N/A 04/24/2013    Procedure: SUCTION DILATATION AND CURETTAGE /HYSTEROSCOPY;  Surgeon: Jonnie Kind, MD;  Location: [facility];  Service: Gynecology;  Laterality: N/A;  . Knee arthroscopy with medial menisectomy Left 07/26/2013    Procedure: KNEE ARTHROSCOPY WITH PARTIAL MEDIAL MENISECTOMY;  Surgeon: Sanjuana Kava, MD;  Location: [facility];  Service: Orthopedics;  Laterality: Left;    Family History  Problem Relation Age of Onset  . Arrhythmia Father     Atrial fib/Pacemaker  . Diabetes Father   . Heart failure Mother   . Diabetes Mother   . Diabetes Sister    Social History:  reports that she has quit smoking. Her smoking use included Cigarettes. She has a 7.5 pack-year smoking history. She has never used smokeless tobacco. She reports that she does not drink alcohol or use illicit drugs.  Allergies:  Allergies  Allergen Reactions  . Imitrex [Sumatriptan] Other (See Comments)    Unknown Reaction   . Mango Flavor     Nausea and vomiting  . Oysters [Shellfish Allergy]     Nausea and vomiting  Medications:  Scheduled: .  stroke: mapping our early stages of recovery book   Does not apply Once  . aspirin  325 mg Oral Daily  . diltiazem  120 mg Oral Daily  . enoxaparin (LOVENOX) injection  40 mg Subcutaneous Q24H  . simvastatin  20 mg Oral QHS  . traMADol  50 mg Oral BID    ROS: History obtained from the patient and her daughter  General ROS: negative for - chills, fatigue, fever, night sweats, weight gain or weight loss. Positive for mild memory problems. Psychological ROS: negative for - behavioral disorder,  hallucinations, memory difficulties, mood swings or suicidal ideation Ophthalmic ROS: negative for - blurry vision, double vision, eye pain or loss of vision. Positive for recent visual disturbance which she describes as spider webs in front of her eyes. ENT ROS: negative for - epistaxis, nasal discharge, oral lesions, sore throat, tinnitus or vertigo Allergy and Immunology ROS: negative for - hives or itchy/watery eyes Hematological and Lymphatic ROS: negative for - bleeding problems, bruising or swollen lymph nodes Endocrine ROS: negative for - galactorrhea, hair pattern changes, polydipsia/polyuria or temperature intolerance Respiratory ROS: negative for - cough, hemoptysis, shortness of breath or wheezing Cardiovascular ROS: negative for - chest pain, dyspnea on exertion, edema or irregular heartbeat Gastrointestinal ROS: negative for - abdominal pain, diarrhea, hematemesis, nausea/vomiting or stool incontinence Genito-Urinary ROS: negative for - dysuria, hematuria, incontinence or urinary frequency/urgency Musculoskeletal ROS: negative for - joint swelling or muscular weakness. Positive for knee pain. Neurological ROS: as noted in HPI. Positive for recent headache like pain in the right mastoid area.  Dermatological ROS: negative for rash and skin lesion changes    Physical Examination: Blood pressure 138/63, pulse 64, temperature 98.3 F (36.8 C), temperature source Oral, resp. rate 18, height 5' (1.524 m), weight 282 lb (127.914 kg), last menstrual period 03/31/2013, SpO2 98.00%.  Neurologic Examination: Mental Status: Alert, oriented, thought content appropriate.  Speech fluent without evidence of aphasia.  Able to follow 3 step commands without difficulty. Some difficulty with simple math problems. Able to recall 2 of 3 objects after 1 minute. Cranial Nerves: II: Discs not visualized; Visual fields grossly normal, pupils equal, round, reactive to light and accommodation III,IV, VI:  ptosis not present, extra-ocular motions intact bilaterally V,VII: smile symmetric, facial light touch sensation normal bilaterally VIII: hearing normal bilaterally IX,X: gag reflex present XI: bilateral shoulder shrug XII: midline tongue extension Motor: Right : Upper extremity   5/5    Left:     Upper extremity   5/5  Lower extremity   5/5     Lower extremity   4+/5 Tone and bulk:normal tone throughout; no atrophy noted Sensory: Pinprick and light touch intact throughout, bilaterally Deep Tendon Reflexes: 2+ and symmetric throughout Plantars: Right: downgoing   Left: downgoing Cerebellar: normal finger-to-nose. Patient could not perform heel-to-shin while seated. Gait: Deferred    Laboratory Studies:  Basic Metabolic Panel:  Recent Labs Lab 07/26/14 1631  NA 144  K 4.6  CL 105  CO2 30  GLUCOSE 148*  BUN 29*  CREATININE 1.10  CALCIUM 9.2    Liver Function Tests:  Recent Labs Lab 07/26/14 1631  AST 14  ALT 12  ALKPHOS 66  BILITOT 0.3  PROT 6.7  ALBUMIN 3.0*   No results found for this basename: LIPASE, AMYLASE,  in the last 168 hours No results found for this basename: AMMONIA,  in the last 168 hours  CBC:  Recent Labs Lab 07/26/14 1631  WBC 4.1  NEUTROABS 2.6  HGB 13.9  HCT 41.5  MCV 99.8  PLT 170    Cardiac Enzymes:  Recent Labs Lab 07/26/14 1631  TROPONINI <0.30    BNP: No components found with this basename: POCBNP,   CBG:  Recent Labs Lab 07/26/14 1649 07/26/14 2233 07/27/14 0648 07/27/14 1229  GLUCAP 164* 170* 150* 119*     Coagulation Studies:  Recent Labs  07/26/14 1631  LABPROT 13.2  INR 1.00    Urinalysis: No results found for this basename: COLORURINE, APPERANCEUR, LABSPEC, PHURINE, GLUCOSEU, HGBUR, BILIRUBINUR, KETONESUR, PROTEINUR, UROBILINOGEN, NITRITE, LEUKOCYTESUR,  in the last 168 hours  Lipid Panel:    Component Value Date/Time   CHOL 173 07/27/2014 0744   TRIG 74 07/27/2014 0744   HDL 66 07/27/2014  0744   CHOLHDL 2.6 07/27/2014 0744   VLDL 15 07/27/2014 0744   LDLCALC 92 07/27/2014 0744    HgbA1C:  Lab Results  Component Value Date   HGBA1C 6.6* 07/18/2011    Urine Drug Screen:     Component Value Date/Time   LABOPIA NONE DETECTED 07/27/2014 0509   COCAINSCRNUR NONE DETECTED 07/27/2014 0509   LABBENZ NONE DETECTED 07/27/2014 0509   AMPHETMU NONE DETECTED 07/27/2014 0509   THCU NONE DETECTED 07/27/2014 0509   LABBARB NONE DETECTED 07/27/2014 0509    Alcohol Level: No results found for this basename: ETH,  in the last 168 hours  Other results: EKG: Afib rate 82 BPM.  2D Echo  07/27/2014 Left ventricle: The cavity size was normal. Systolic function wasnormal.  The estimated ejection fraction was in the range of 55%to 60%.  Wall motion was normal; there were no regional wall motion abnormalities.  Doppler parameters are consistent with abnormal left ventricular relaxation (grade 1 diastolic dysfunction).  Imaging:  Ct Head (brain) Wo Contrast  07/26/2014    Again noted cerebral atrophy and chronic white matter disease. There is new area of decreased attenuation in right frontal lobe white matter measures about 1.5 cm. This is highly suspicious for recent or evolving ischemia.    Mri Brain Without Contrast 07/27/2014    Acute nonhemorrhagic right anterior cerebral artery infarct.    Mr Jodene Nam Head/brain Wo Cm 07/27/2014    Occlusion of the right A2 segment due to embolus or thrombus.  Chronic microvascular ischemic changes in the white matter.  Severe stenosis right middle cerebral artery.      Assessment: 73 y.o. female with a history of atrial fibrillation, currently not on anticoagulation presenting with acute anterior cerebral territory ischemic infarction on the right. Severe stenosis of right middle cerebral artery was also noted on MRA. Carotid Doppler study and 2-D echocardiogram results are pending. Fasting lipid panel was normal. Hemoglobin A1c was 6 6.2.  Stroke Risk  Factors - atrial fibrillation, diabetes mellitus, hyperlipidemia, hypertension and Severe cerebrovascular disease   Plan:  Prophylactic therapy-Antiplatelet med: Aspirin - dose 325 mg daily. Anticoagulation to be decided by stroke team  Risk factor modification   Lowry Ram [facility] Pager [phone] 07/27/2014, 3:06 PM  I personally participated in this patient's evaluation and management, including formulating the above clinical impression and management recommendations.  Rush Farmer M.D. [facility] [phone]

## 2014-07-27 NOTE — Progress Notes (Signed)
Pt arrived via [facility] at 2113, pt alert and oriented to [facility], call bell in place, bed alarm on. Orders are for q4 hour neuro checks and vital signs but pt is coming in for possible stroke, will do q2 hr vital signs and neuro checks. Will continue to monitor.

## 2014-07-27 NOTE — Progress Notes (Signed)
SLP Cancellation Note  Patient Details Name: [name] MRN: [id number] DOB: June 07, 1941   Cancelled treatment:        Orders received for BSE. Pt passed RN Stroke Swallow Screen and placed on po diet. Please reconsult if needs arise.  Laquonda Welby B. Quentin Ore [facility], CCC-SLP [phone]  Shonna Chock 07/27/2014, 3:04 PM

## 2014-07-27 NOTE — Progress Notes (Signed)
Echo Lab  2D Echocardiogram completed.  [place], RDCS 07/27/2014 9:21 AM

## 2014-07-27 NOTE — Progress Notes (Signed)
[facility] PROGRESS NOTE  ILEE Simmons WUX:[id number] DOB: 04-11-1941 DOA: 07/26/2014 PCP: Rosita Fire, MD  Assessment/Plan: 1 left leg weakness  #2 right frontal lobe lesion - suspicious for ischemic stroke  With the left leg weakness and no right frontal lobe lesion, there is extreme concern for stroke stroke. Given that, MRI is indicated. [facility] was [facility]. And stroke work up in progress.  #3 atrial fibrillation  Start the patient on Lovenox  Patient is currently rate controlled on Cardizem  #4 diabetes - diet controlled  We'll continue with diet control.  #5 hypertension  Continue Cardizem  Consultants: Neurology  Code Status: Full  Family Communication: Daughter  Disposition Plan: Pending MRI    Consultants:  neurology  Procedures:  CT head  MRI brain.  Antibiotics:  none  HPI/Subjectivedenies any new complaints.   Objective: Filed Vitals:   07/27/14 1604  BP: 148/77  Pulse: 66  Temp: 97.9 F (36.6 C)  Resp: 20    Intake/Output Summary (Last 24 hours) at 07/27/14 1924 Last data filed at 07/27/14 1614  Gross per 24 hour  Intake    240 ml  Output      0 ml  Net    240 ml   Filed Weights   07/26/14 1555  Weight: 127.914 kg (282 lb)    Exam:   General:  Alert afebrile comfortable  Cardiovascular: s1s2  Respiratory: ctab  Abdomen: soft NT ND BS+  Musculoskeletal:pedal edema present.     Data Reviewed: Basic Metabolic Panel:  Recent Labs Lab 07/26/14 1631  NA 144  K 4.6  CL 105  CO2 30  GLUCOSE 148*  BUN 29*  CREATININE 1.10  CALCIUM 9.2   Liver Function Tests:  Recent Labs Lab 07/26/14 1631  AST 14  ALT 12  ALKPHOS 66  BILITOT 0.3  PROT 6.7  ALBUMIN 3.0*   No results found for this basename: LIPASE, AMYLASE,  in the last 168 hours No results found for this basename: AMMONIA,  in the last 168 hours CBC:  Recent Labs Lab 07/26/14 1631  WBC 4.1  NEUTROABS 2.6  HGB 13.9  HCT 41.5   MCV 99.8  PLT 170   Cardiac Enzymes:  Recent Labs Lab 07/26/14 1631  TROPONINI <0.30   BNP (last 3 results) No results found for this basename: PROBNP,  in the last 8760 hours CBG:  Recent Labs Lab 07/26/14 1649 07/26/14 2233 07/27/14 0648 07/27/14 1229 07/27/14 1637  GLUCAP 164* 170* 150* 119* 93    No results found for this or any previous visit (from the past 240 hour(s)).   Studies: Ct Head (brain) Wo Contrast  07/26/2014   CLINICAL DATA:  Leg weakness, visual disturbance  EXAM: CT HEAD WITHOUT CONTRAST  TECHNIQUE: Contiguous axial images were obtained from the base of the skull through the vertex without intravenous contrast.  COMPARISON:  03/21/2013  FINDINGS: No skull fracture is noted. There is mucosal thickening bilateral ethmoid air cells. The mastoid air cells are unremarkable. No intracranial hemorrhage, mass effect or midline shift. Again noted cerebral atrophy. Patchy periventricular and subcortical white matter decreased attenuation consistent with chronic small vessel ischemic changes again noted. There is new area of decreased attenuation in right frontal lobe white matter measures about 1.5 cm. This is highly suspicious for evolving white matter ischemia. Clinical correlation is necessary. Further evaluation with MRI is recommended. Please see image 15. No mass lesion is noted on this unenhanced scan.  IMPRESSION: 1. Again noted cerebral  atrophy and chronic white matter disease. There is new area of decreased attenuation in right frontal lobe white matter measures about 1.5 cm. This is highly suspicious for recent or evolving ischemia. Clinical correlation is necessary. Further evaluation with MRI is recommended as clinically warranted.   Electronically Signed   By: Lahoma Crocker M.D.   On: 07/26/2014 16:23   Mri Brain Without Contrast  07/27/2014   CLINICAL DATA:  Left leg weakness.  Stroke  EXAM: MRI HEAD WITHOUT CONTRAST  MRA HEAD WITHOUT CONTRAST  TECHNIQUE:  Multiplanar, multiecho pulse sequences of the brain and surrounding structures were obtained without intravenous contrast. Angiographic images of the head were obtained using MRA technique without contrast.  COMPARISON:  CT Apr 25, 2014  FINDINGS: MRI HEAD FINDINGS  Acute infarct in the right anterior cerebral artery territory involving the anterior corpus callosum and right frontal white matter. No associated hemorrhage. No other acute infarct.  Clot is seen in the right A2 segment compatible with occlusion due to embolus or thrombus.  Ventricle size is normal. Chronic microvascular ischemic changes throughout the white matter. Dilated perivascular spaces in the basal ganglia. Brainstem and cerebellum are normal.  Negative for mass or edema.  No shift of the midline structures.  Mild mucosal edema in the paranasal sinuses and right mastoid sinus.  MRA HEAD FINDINGS  Both vertebral arteries are patent to the basilar. Basilar widely patent. AICA patent bilaterally. Superior cerebellar and posterior cerebral arteries are patent bilaterally. Mild stenosis in the left posterior cerebral artery.  Mild atherosclerotic disease in the cavernous carotid bilaterally without significant stenosis.  Mild atherosclerotic disease in the right A1 segment. Occlusion of the right A2 segment due to embolus or thrombus. This is the cause of the right anterior cerebral artery infarct.  Severe stenosis right MCA bifurcation with focal area of signal void. This is just past the right temporal branch.  Left anterior cerebral artery widely patent. Left middle cerebral artery patent with mild atherosclerotic disease.  Negative for aneurysm  IMPRESSION: Acute nonhemorrhagic right anterior cerebral artery infarct. Occlusion of the right A2 segment due to embolus or thrombus.  Chronic microvascular ischemic changes in the white matter.  Severe stenosis right middle cerebral artery.   Electronically Signed   By: Franchot Gallo M.D.   On:  07/27/2014 13:26   Mr [name] Head/brain Wo Cm  07/27/2014   CLINICAL DATA:  Left leg weakness.  Stroke  EXAM: MRI HEAD WITHOUT CONTRAST  MRA HEAD WITHOUT CONTRAST  TECHNIQUE: Multiplanar, multiecho pulse sequences of the brain and surrounding structures were obtained without intravenous contrast. Angiographic images of the head were obtained using MRA technique without contrast.  COMPARISON:  CT Apr 25, 2014  FINDINGS: MRI HEAD FINDINGS  Acute infarct in the right anterior cerebral artery territory involving the anterior corpus callosum and right frontal white matter. No associated hemorrhage. No other acute infarct.  Clot is seen in the right A2 segment compatible with occlusion due to embolus or thrombus.  Ventricle size is normal. Chronic microvascular ischemic changes throughout the white matter. Dilated perivascular spaces in the basal ganglia. Brainstem and cerebellum are normal.  Negative for mass or edema.  No shift of the midline structures.  Mild mucosal edema in the paranasal sinuses and right mastoid sinus.  MRA HEAD FINDINGS  Both vertebral arteries are patent to the basilar. Basilar widely patent. AICA patent bilaterally. Superior cerebellar and posterior cerebral arteries are patent bilaterally. Mild stenosis in the left posterior cerebral artery.  Mild atherosclerotic disease in the cavernous carotid bilaterally without significant stenosis.  Mild atherosclerotic disease in the right A1 segment. Occlusion of the right A2 segment due to embolus or thrombus. This is the cause of the right anterior cerebral artery infarct.  Severe stenosis right MCA bifurcation with focal area of signal void. This is just past the right temporal branch.  Left anterior cerebral artery widely patent. Left middle cerebral artery patent with mild atherosclerotic disease.  Negative for aneurysm  IMPRESSION: Acute nonhemorrhagic right anterior cerebral artery infarct. Occlusion of the right A2 segment due to embolus or  thrombus.  Chronic microvascular ischemic changes in the white matter.  Severe stenosis right middle cerebral artery.   Electronically Signed   By: Franchot Gallo M.D.   On: 07/27/2014 13:26    Scheduled Meds: .  stroke: mapping our early stages of recovery book   Does not apply Once  . aspirin  325 mg Oral Daily  . diltiazem  120 mg Oral Daily  . enoxaparin (LOVENOX) injection  40 mg Subcutaneous Q24H  . simvastatin  20 mg Oral QHS  . traMADol  50 mg Oral BID   Continuous Infusions: . sodium chloride 1,000 mL (07/26/14 2231)    Active Problems:   Left leg weakness   Right frontal lobe lesion   Cerebral embolism with cerebral infarction    Time spent: 25 min    [facility] Pager [phone]. If 7PM-7AM, please contact night-coverage at [email or web address], password [facility] 07/27/2014, 7:24 PM  LOS: 1 day

## 2014-07-28 LAB — GLUCOSE, CAPILLARY
Glucose-Capillary: 107 mg/dL — ABNORMAL HIGH (ref 70–99)
Glucose-Capillary: 93 mg/dL (ref 70–99)
Glucose-Capillary: 89 mg/dL (ref 70–99)

## 2014-07-28 NOTE — Evaluation (Signed)
Physical Therapy Evaluation Patient Details Name: [name] MRN: [id number] DOB: 04/17/1941 Today's Date: 07/28/2014   History of Present Illness  73 y.o. female with a history of diabetes, hypertension, hyperlipidemia, previous stroke/TIA, and atrial fibrillation. Admitted with LLE weakness/confusion. MRI revealed Acute nonhemorrhagic right anterior cerebral artery infarct.  Clinical Impression  Pt admitted with symptoms that seem to have resolved at this point. Pt currently with functional limitations due to the deficits listed below (see PT Problem List). Pt with generalized weakness bilateral LE's that affects mobility.  Pt will benefit from skilled PT to increase their independence and safety with mobility to allow discharge to the venue listed below for strengthening and balance. She would also benefit from RW for community ambulation. PT will continue to follow.       Follow Up Recommendations Outpatient PT    Equipment Recommendations  Rolling walker with 5" wheels    Recommendations for Other Services       Precautions / Restrictions Precautions Precautions: Fall Restrictions Weight Bearing Restrictions: No      Mobility  Bed Mobility               General bed mobility comments: pt received sitting in recliner  Transfers Overall transfer level: Modified independent Equipment used: Rolling walker (2 wheeled);None Transfers: Sit to/from Stand Sit to Stand: Modified independent (Device/Increase time)         General transfer comment: pt steady with transfers despite knee OA  Ambulation/Gait Ambulation/Gait assistance: Supervision Ambulation Distance (Feet): 150 Feet Assistive device: Rolling walker (2 wheeled);None Gait Pattern/deviations: Step-through pattern;Wide base of support Gait velocity: slightly decreased   General Gait Details: pt ambulated with and without RW, no LOB without RW but pt reports she would feel safer having one for out of  the house mobility.   Stairs Stairs: Yes Stairs assistance: Min guard Stair Management: One rail Right;Step to pattern;Forwards Number of Stairs: 3 General stair comments: pt has difficulty with descent due to knee weakness, recommended to pt that she face rail abd hold with both hands  Wheelchair Mobility    Modified Rankin (Stroke Patients Only) Modified Rankin (Stroke Patients Only) Pre-Morbid Rankin Score: No symptoms Modified Rankin: No symptoms     Balance Overall balance assessment: Needs assistance Sitting-balance support: No upper extremity supported;Feet supported Sitting balance-Leahy Scale: Good     Standing balance support: No upper extremity supported;During functional activity Standing balance-Leahy Scale: Fair                               Pertinent Vitals/Pain Pain Assessment: No/denies pain    Home Living Family/patient expects to be discharged to:: Private residence Living Arrangements: Children (pt states son lives with her) Available Help at Discharge: Family;Available 24 hours/day Type of Home: House Home Access: Stairs to enter Entrance Stairs-Rails: Left Entrance Stairs-Number of Steps: 3 Home Layout: One level Home Equipment: Cane - single point;Shower seat Additional Comments: pt reports shower seat does not fit in tub    Prior Function Level of Independence: Independent with assistive device(s)               Hand Dominance        Extremity/Trunk Assessment   Upper Extremity Assessment: Defer to OT evaluation;Overall WFL for tasks assessed           Lower Extremity Assessment: Generalized weakness;RLE deficits/detail;LLE deficits/detail RLE Deficits / Details: pt demonstrates generalized weakness bilateral LE but right  equal to left. She reports OA bilateral knees, left worse than right.  LLE Deficits / Details: see note for RLE  Cervical / Trunk Assessment: Normal  Communication   Communication: No  difficulties  Cognition Arousal/Alertness: Awake/alert Behavior During Therapy: WFL for tasks assessed/performed Overall Cognitive Status: Within Functional Limits for tasks assessed     Current Attention Level: Alternating Memory: Decreased short-term memory Following Commands: Follows one step commands consistently;Follows multi-step commands consistently     Problem Solving: Slow processing General Comments: cognitive deficits not obvious on eval today, did exhibit delayed processing but seemed that part of this was hearing issue, relayed events that happened accurately as well as describing deficits    General Comments      Exercises Other Exercises Other Exercises: reviewed pt's HEP that she has been doing since last time she received PT      Assessment/Plan    PT Assessment Patient needs continued PT services  PT Diagnosis Abnormality of gait;Generalized weakness   PT Problem List Decreased strength;Decreased balance;Obesity;Decreased mobility  PT Treatment Interventions DME instruction;Gait training;Stair training;Functional mobility training;Therapeutic activities;Therapeutic exercise;Balance training;Patient/family education   PT Goals (Current goals can be found in the Care Plan section) Acute Rehab PT Goals Patient Stated Goal: return home PT Goal Formulation: With patient Time For Goal Achievement: 08/04/14 Potential to Achieve Goals: Good    Frequency Min 4X/week   Barriers to discharge        Co-evaluation               End of Session Equipment Utilized During Treatment: Gait belt Activity Tolerance: Patient tolerated treatment well Patient left: in chair;with chair alarm set;with call bell/phone within reach Nurse Communication: Mobility status         Time: 7353-2992 PT Time Calculation (min): 25 min   Charges:   PT Evaluation $Initial PT Evaluation Tier I: 1 Procedure PT Treatments $Gait Training: 23-37 mins   PT G Codes:         Leighton Roach, PT  Acute Rehab Services  [place], [place] 07/28/2014, 10:26 AM

## 2014-07-28 NOTE — Progress Notes (Signed)
ANTICOAGULATION CONSULT NOTE - Initial Consult  Pharmacy Consult for eliquis Indication: atrial fibrillation  Allergies  Allergen Reactions  . Imitrex [Sumatriptan] Other (See Comments)    Unknown Reaction   . Mango Flavor     Nausea and vomiting  . Oysters [Shellfish Allergy]     Nausea and vomiting    Patient Measurements: Height: 5' (152.4 cm) Weight: 282 lb (127.914 kg) IBW/kg (Calculated) : 45.5   Vital Signs: Temp: 97.7 F (36.5 C) (08/23 1359) Temp src: Oral (08/23 1359) BP: 131/69 mmHg (08/23 1359) Pulse Rate: 57 (08/23 1359)  Labs:  Recent Labs  07/26/14 1631  HGB 13.9  HCT 41.5  PLT 170  LABPROT 13.2  INR 1.00  CREATININE 1.10  TROPONINI <0.30    Estimated Creatinine Clearance: 58.1 ml/min (by C-G formula based on Cr of 1.1).   Medical History: Past Medical History  Diagnosis Date  . Diabetes mellitus   . Asthma   . Chronic knee pain   . Back pain, chronic   . Hypertension   . Vertigo   . Hyperlipidemia   . TIA (transient ischemic attack)   . Anemia   . HOH (hard of hearing)   . Arthritis     Medications:  Prescriptions prior to admission  Medication Sig Dispense Refill  . aspirin EC 81 MG tablet Take 81 mg by mouth every morning.       . cholecalciferol (VITAMIN D) 1000 UNITS tablet Take 1,000 Units by mouth every morning.       . diltiazem (TIAZAC) 120 MG 24 hr capsule Take 120 mg by mouth daily.      Marland Kitchen levalbuterol (XOPENEX HFA) 45 MCG/ACT inhaler Inhale 2 puffs into the lungs every 6 (six) hours as needed for wheezing or shortness of breath.  1 Inhaler  3  . naproxen sodium (ALEVE) 220 MG tablet Take 220 mg by mouth 2 (two) times daily as needed (for pain).       . simvastatin (ZOCOR) 20 MG tablet Take 20 mg by mouth at bedtime.       . traMADol (ULTRAM) 50 MG tablet Take 50 mg by mouth 2 (two) times daily.         Assessment: 73 yo lady to start eliquis for afib, secondary stroke prevention.  Her baseline CBC is wnl.   Goal of  Therapy:  Monitor platelets by anticoagulation protocol: Yes   Plan:  Eliquis 5 mg po bid. Monitor for bleeding. F/u with education  Excell Seltzer [place] 07/28/2014,2:45 PM

## 2014-07-28 NOTE — Progress Notes (Addendum)
Stroke Team Progress Note  HISTORY [name] is a 73 y.o. female with a history of diabetes, hypertension, hyperlipidemia, previous stroke/TIA, and atrial fibrillation. The patient was on Coumadin in the past however this was discontinued approximately one year ago secondary to abnormal uterine bleeding. She has been on aspirin 81 mg daily since that time. The patient was on her way to church with her daughter Thursday evening, 07/25/2014, when her daughter noticed that she was moving slower than normal, was not very talkative, and appeared to be mildly confused. The patient is normally mentally sharp although she has had some mild memory problems over the past couple of years. On Friday other family members noted left lower extremity weakness with a left lower facial droop, and the patient was brought to the emergency department for further evaluation and subsequent admission. Prior to admission the patient was fairly independent and ambulated with a cane occasionally secondary to knee pain. There is no history of falls.  An MRI today revealed an acute nonhemorrhagic right anterior cerebral artery infarct. An MRA showed ccclusion of the right A2 segment due to embolus or thrombus, as well as, severe stenosis right middle cerebral artery. Neurology was asked to see the patient. The patient's daughter and son-in-law were in the room. The daughter feels that her mother has improved; however, her mental status is still not back to baseline.   Date last known well: Date: 07/24/2014  Time last known well: Unable to determine  tPA Given: No: - Late arrival   SUBJECTIVE No family members present today. The patient voices no complaints. Her LLE weakness much improved. She stated that she was off coumadin because of uterine bleeding. She was recommended by her OBGYN to have hysterectomy but she did not follow up with the OBGYN doctor and she did not resume coumadin. She has no uterine bleeding since  then.  OBJECTIVE Most recent Vital Signs: Filed Vitals:   07/28/14 0217 07/28/14 0607 07/28/14 1038 07/28/14 1359  BP: 150/82 161/67 154/63 131/69  Pulse: 61 71 64 57  Temp: 98.6 F (37 C) 97.5 F (36.4 C) 98.4 F (36.9 C) 97.7 F (36.5 C)  TempSrc: Oral Oral Oral Oral  Resp: 18 18 16 20   Height:      Weight:      SpO2: 98% 87% 99% 97%   CBG (last 3)   Recent Labs  07/27/14 2138 07/28/14 0629 07/28/14 1207  GLUCAP 128* 107* 93    IV Fluid Intake:     MEDICATIONS  .  stroke: mapping our early stages of recovery book   Does not apply Once  . aspirin  325 mg Oral Daily  . diltiazem  120 mg Oral Daily  . enoxaparin (LOVENOX) injection  40 mg Subcutaneous Q24H  . simvastatin  20 mg Oral QHS  . traMADol  50 mg Oral BID   PRN:  levalbuterol, naproxen  Diet:  Carb Control thin liquids Activity:  Up with assistance DVT Prophylaxis:  Lovenox  CLINICALLY SIGNIFICANT STUDIES Basic Metabolic Panel:  Recent Labs Lab 07/26/14 1631  NA 144  K 4.6  CL 105  CO2 30  GLUCOSE 148*  BUN 29*  CREATININE 1.10  CALCIUM 9.2   Liver Function Tests:  Recent Labs Lab 07/26/14 1631  AST 14  ALT 12  ALKPHOS 66  BILITOT 0.3  PROT 6.7  ALBUMIN 3.0*   CBC:  Recent Labs Lab 07/26/14 1631  WBC 4.1  NEUTROABS 2.6  HGB 13.9  HCT  41.5  MCV 99.8  PLT 170   Coagulation:  Recent Labs Lab 07/26/14 1631  LABPROT 13.2  INR 1.00   Cardiac Enzymes:  Recent Labs Lab 07/26/14 1631  TROPONINI <0.30   Urinalysis: No results found for this basename: COLORURINE, APPERANCEUR, LABSPEC, PHURINE, GLUCOSEU, HGBUR, BILIRUBINUR, KETONESUR, PROTEINUR, UROBILINOGEN, NITRITE, LEUKOCYTESUR,  in the last 168 hours Lipid Panel    Component Value Date/Time   CHOL 173 07/27/2014 0744   TRIG 74 07/27/2014 0744   HDL 66 07/27/2014 0744   CHOLHDL 2.6 07/27/2014 0744   VLDL 15 07/27/2014 0744   LDLCALC 92 07/27/2014 0744   HgbA1C  Lab Results  Component Value Date   HGBA1C 6.2* 07/26/2014     Urine Drug Screen:     Component Value Date/Time   LABOPIA NONE DETECTED 07/27/2014 0509   COCAINSCRNUR NONE DETECTED 07/27/2014 0509   LABBENZ NONE DETECTED 07/27/2014 0509   AMPHETMU NONE DETECTED 07/27/2014 0509   THCU NONE DETECTED 07/27/2014 0509   LABBARB NONE DETECTED 07/27/2014 0509    Alcohol Level: No results found for this basename: ETH,  in the last 168 hours   Ct Head (brain) Wo Contrast 07/26/2014    Again noted cerebral atrophy and chronic white matter disease. There is new area of decreased attenuation in right frontal lobe white matter measures about 1.5 cm. This is highly suspicious for recent or evolving ischemia. Clinical correlation is necessary. Further evaluation with MRI is recommended as clinically warranted.      Mri Brain Without Contrast 07/27/2014    Acute nonhemorrhagic right anterior cerebral artery infarct. Occlusion of the right A2 segment due to embolus or thrombus.  Chronic microvascular ischemic changes in the white matter.  Severe stenosis right middle cerebral artery.     Mr Jodene Nam Head/brain Wo Cm 07/27/2014    Occlusion of the right A2 segment due to embolus or thrombus.  Chronic microvascular ischemic changes in the white matter.  Severe stenosis right middle cerebral artery.     Carotid Doppler  pending  2D Echocardiogram  Left ventricle: The cavity size was normal. Systolic function wasnormal.  The estimated ejection fraction was in the range of 55%to 60%.  Wall motion was normal; there were no regional wallmotion abnormalities.  Doppler parameters are consistent with abnormal left ventricular relaxation (grade 1 diastolic dysfunction).  EKG - Atrial fibrillation. Ventricular response 82 beats per minute. For complete results please see formal report.   Therapy Recommendations - Outpatient occupational and physical therapies. 24 hour per day supervision recommended.  Physical Exam   Physical exam  Temp:  [97.5 F (36.4 C)-98.6 F (37 C)]  97.9 F (36.6 C) (08/23 1810) Pulse Rate:  [57-71] 60 (08/23 1810) Resp:  [16-20] 20 (08/23 1810) BP: (131-161)/(63-85) 154/85 mmHg (08/23 1810) SpO2:  [87 %-99 %] 98 % (08/23 1810)  General - obese, well developed, in no apparent distress.  Ophthalmologic - not able to see through.  Cardiovascular - irregularly irregular heart rate.  Neurologic Examination:  Mental Status:  Alert, oriented, thought content appropriate. Speech fluent without evidence of aphasia. Able to follow 3 step commands without difficulty. Some difficulty with simple math problems. Able to recall 2 of 3 objects after 1 minute.  Cranial Nerves:  II: Discs not visualized; Visual fields grossly normal, pupils equal, round, reactive to light and accommodation  III,IV, VI: ptosis not present, extra-ocular motions intact bilaterally  V,VII: smile symmetric, facial light touch sensation normal bilaterally  VIII: hearing normal bilaterally  IX,X:  gag reflex present  XI: bilateral shoulder shrug  XII: midline tongue extension  Motor:  Right : Upper extremity 5/5 Left: Upper extremity 5/5  Right: Lower extremity 5/5 left: Lower extremity 5-/5  Tone and bulk:normal tone throughout; no atrophy noted  Sensory: Pinprick and light touch intact throughout, bilaterally  Deep Tendon Reflexes: 2+ and symmetric throughout  Plantars:  Right: downgoing Left: downgoing  Cerebellar:  normal finger-to-nose. Patient could not perform heel-to-shin while seated.  Gait: Deferred   ASSESSMENT [name] is a 73 y.o. female presenting with altered mental status and left lower extremity weakness. TPA therapy was not initiated secondary to improving deficits and late presentation. Imaging confirms an acute nonhemorrhagic right anterior cerebral artery infarct.  Infarct felt to be embolic secondary to atrial fibrillation.  On aspirin 81 mg orally every day prior to admission. Now on aspirin 325 mg orally every day for secondary  stroke prevention. Patient with resultant significant improvement in deficits. Stroke work up underway.   Atrial fibrillation - Coumadin discontinued in the past secondary to uterine bleeding.  Diabetes mellitus - hemoglobin A1c 6.2  Hyperlipidemia - cholesterol 173; LDL 92 - on Zocor prior to admission and currently  Hypertension history  MRA -  Severe stenosis right middle cerebral artery.     Morbid obesity   OSA  Hospital day # 2  TREATMENT/PLAN  Continue aspirin 325 mg orally every day for secondary stroke prevention. Consider restarting anticoagulation for afib stroke prevention. Please consult pt OBGYN regarding the risk of uterine bleeding if to resume anticoagulation. Pt should be also included in the decision making process. If hesteretomy necessary, please start [facility] after surgery. If consider AC, NOAC will be ideal since it has less risk of bleeding comparing with coumadin. Also, eliquis has less GIB risk among all NOACs.   Await carotid Dopplers  Continue stroke risk factor modification  Need OP sleepy study for OSA  Need weight loss, counseling provided to pt during rounds.  PT and OT recommend outpatient therapies.  Mikey Bussing PA-C [facility] Pager [phone] 07/28/2014, 2:42 PM   SIGNED I, the attending vascular neurologist, have personally obtained a history, examined the patient, evaluated laboratory data, individually viewed imaging studies, and formulated the assessment and plan of care.  I have made any additions or clarifications directly to the above note and agree with the findings and plan as currently documented.   Rosalin Hawking, MD PhD Stroke Neurology 07/28/2014 6:32 PM   To contact Stroke Continuity provider, please refer to [email or web address] After hours, contact General Neurology

## 2014-07-28 NOTE — Progress Notes (Signed)
[facility] PROGRESS NOTE  [name] IWP:[id number] DOB: 10/18/1941 DOA: 07/26/2014 PCP: Rosita Fire, MD  Assessment/Plan: 1 left leg weakness  #2 right frontal lobe lesion - suspicious for ischemic stroke  With the left leg weakness and no right frontal lobe lesion, there is extreme concern for stroke stroke. Given that, MRI is indicated. Patient was transferred to [facility]. And stroke work up in progress. As per discussion with neurology we will start eliquis.  #3 atrial fibrillation   Patient is currently rate controlled on Cardizem  #4 diabetes - diet controlled  We'll continue with diet control.  #5 hypertension  Continue Cardizem  Consultants: Neurology  Code Status: Full  Family Communication: Daughter on the phone.  Disposition Plan: Pending further work up.     Consultants:  neurology  Procedures:  CT head  MRI brain.  Antibiotics:  none  HPI/Subjectivedenies any new complaints.   Objective: Filed Vitals:   07/28/14 1359  BP: 131/69  Pulse: 57  Temp: 97.7 F (36.5 C)  Resp: 20    Intake/Output Summary (Last 24 hours) at 07/28/14 1437 Last data filed at 07/27/14 1614  Gross per 24 hour  Intake    240 ml  Output      0 ml  Net    240 ml   Filed Weights   07/26/14 1555  Weight: 127.914 kg (282 lb)    Exam:   General:  Alert afebrile comfortable  Cardiovascular: s1s2  Respiratory: ctab  Abdomen: soft NT ND BS+  Musculoskeletal:pedal edema present.     Data Reviewed: Basic Metabolic Panel:  Recent Labs Lab 07/26/14 1631  NA 144  K 4.6  CL 105  CO2 30  GLUCOSE 148*  BUN 29*  CREATININE 1.10  CALCIUM 9.2   Liver Function Tests:  Recent Labs Lab 07/26/14 1631  AST 14  ALT 12  ALKPHOS 66  BILITOT 0.3  PROT 6.7  ALBUMIN 3.0*   No results found for this basename: LIPASE, AMYLASE,  in the last 168 hours No results found for this basename: AMMONIA,  in the last 168 hours CBC:  Recent Labs Lab  07/26/14 1631  WBC 4.1  NEUTROABS 2.6  HGB 13.9  HCT 41.5  MCV 99.8  PLT 170   Cardiac Enzymes:  Recent Labs Lab 07/26/14 1631  TROPONINI <0.30   BNP (last 3 results) No results found for this basename: PROBNP,  in the last 8760 hours CBG:  Recent Labs Lab 07/27/14 1229 07/27/14 1637 07/27/14 2138 07/28/14 0629 07/28/14 1207  GLUCAP 119* 93 128* 107* 93    No results found for this or any previous visit (from the past 240 hour(s)).   Studies: Ct Head (brain) Wo Contrast  07/26/2014   CLINICAL DATA:  Leg weakness, visual disturbance  EXAM: CT HEAD WITHOUT CONTRAST  TECHNIQUE: Contiguous axial images were obtained from the base of the skull through the vertex without intravenous contrast.  COMPARISON:  03/21/2013  FINDINGS: No skull fracture is noted. There is mucosal thickening bilateral ethmoid air cells. The mastoid air cells are unremarkable. No intracranial hemorrhage, mass effect or midline shift. Again noted cerebral atrophy. Patchy periventricular and subcortical white matter decreased attenuation consistent with chronic small vessel ischemic changes again noted. There is new area of decreased attenuation in right frontal lobe white matter measures about 1.5 cm. This is highly suspicious for evolving white matter ischemia. Clinical correlation is necessary. Further evaluation with MRI is recommended. Please see image 15. No mass lesion is noted  on this unenhanced scan.  IMPRESSION: 1. Again noted cerebral atrophy and chronic white matter disease. There is new area of decreased attenuation in right frontal lobe white matter measures about 1.5 cm. This is highly suspicious for recent or evolving ischemia. Clinical correlation is necessary. Further evaluation with MRI is recommended as clinically warranted.   Electronically Signed   By: Lahoma Crocker M.D.   On: 07/26/2014 16:23   Mri Brain Without Contrast  07/27/2014   CLINICAL DATA:  Left leg weakness.  Stroke  EXAM: MRI HEAD  WITHOUT CONTRAST  MRA HEAD WITHOUT CONTRAST  TECHNIQUE: Multiplanar, multiecho pulse sequences of the brain and surrounding structures were obtained without intravenous contrast. Angiographic images of the head were obtained using MRA technique without contrast.  COMPARISON:  CT Apr 25, 2014  FINDINGS: MRI HEAD FINDINGS  Acute infarct in the right anterior cerebral artery territory involving the anterior corpus callosum and right frontal white matter. No associated hemorrhage. No other acute infarct.  Clot is seen in the right A2 segment compatible with occlusion due to embolus or thrombus.  Ventricle size is normal. Chronic microvascular ischemic changes throughout the white matter. Dilated perivascular spaces in the basal ganglia. Brainstem and cerebellum are normal.  Negative for mass or edema.  No shift of the midline structures.  Mild mucosal edema in the paranasal sinuses and right mastoid sinus.  MRA HEAD FINDINGS  Both vertebral arteries are patent to the basilar. Basilar widely patent. AICA patent bilaterally. Superior cerebellar and posterior cerebral arteries are patent bilaterally. Mild stenosis in the left posterior cerebral artery.  Mild atherosclerotic disease in the cavernous carotid bilaterally without significant stenosis.  Mild atherosclerotic disease in the right A1 segment. Occlusion of the right A2 segment due to embolus or thrombus. This is the cause of the right anterior cerebral artery infarct.  Severe stenosis right MCA bifurcation with focal area of signal void. This is just past the right temporal branch.  Left anterior cerebral artery widely patent. Left middle cerebral artery patent with mild atherosclerotic disease.  Negative for aneurysm  IMPRESSION: Acute nonhemorrhagic right anterior cerebral artery infarct. Occlusion of the right A2 segment due to embolus or thrombus.  Chronic microvascular ischemic changes in the white matter.  Severe stenosis right middle cerebral artery.    Electronically Signed   By: Franchot Gallo M.D.   On: 07/27/2014 13:26   Mr Jodene Nam Head/brain Wo Cm  07/27/2014   CLINICAL DATA:  Left leg weakness.  Stroke  EXAM: MRI HEAD WITHOUT CONTRAST  MRA HEAD WITHOUT CONTRAST  TECHNIQUE: Multiplanar, multiecho pulse sequences of the brain and surrounding structures were obtained without intravenous contrast. Angiographic images of the head were obtained using MRA technique without contrast.  COMPARISON:  CT Apr 25, 2014  FINDINGS: MRI HEAD FINDINGS  Acute infarct in the right anterior cerebral artery territory involving the anterior corpus callosum and right frontal white matter. No associated hemorrhage. No other acute infarct.  Clot is seen in the right A2 segment compatible with occlusion due to embolus or thrombus.  Ventricle size is normal. Chronic microvascular ischemic changes throughout the white matter. Dilated perivascular spaces in the basal ganglia. Brainstem and cerebellum are normal.  Negative for mass or edema.  No shift of the midline structures.  Mild mucosal edema in the paranasal sinuses and right mastoid sinus.  MRA HEAD FINDINGS  Both vertebral arteries are patent to the basilar. Basilar widely patent. AICA patent bilaterally. Superior cerebellar and posterior cerebral arteries are patent  bilaterally. Mild stenosis in the left posterior cerebral artery.  Mild atherosclerotic disease in the cavernous carotid bilaterally without significant stenosis.  Mild atherosclerotic disease in the right A1 segment. Occlusion of the right A2 segment due to embolus or thrombus. This is the cause of the right anterior cerebral artery infarct.  Severe stenosis right MCA bifurcation with focal area of signal void. This is just past the right temporal branch.  Left anterior cerebral artery widely patent. Left middle cerebral artery patent with mild atherosclerotic disease.  Negative for aneurysm  IMPRESSION: Acute nonhemorrhagic right anterior cerebral artery infarct.  Occlusion of the right A2 segment due to embolus or thrombus.  Chronic microvascular ischemic changes in the white matter.  Severe stenosis right middle cerebral artery.   Electronically Signed   By: Franchot Gallo M.D.   On: 07/27/2014 13:26    Scheduled Meds: .  stroke: mapping our early stages of recovery book   Does not apply Once  . aspirin  325 mg Oral Daily  . diltiazem  120 mg Oral Daily  . enoxaparin (LOVENOX) injection  40 mg Subcutaneous Q24H  . simvastatin  20 mg Oral QHS  . traMADol  50 mg Oral BID   Continuous Infusions:    Active Problems:   Left leg weakness   Right frontal lobe lesion   Cerebral embolism with cerebral infarction    Time spent: 25 min    [facility] Pager [phone]. If 7PM-7AM, please contact night-coverage at [email or web address], password [email or web address] 07/28/2014, 2:37 PM  LOS: 2 days

## 2014-07-29 LAB — GLUCOSE, CAPILLARY
Glucose-Capillary: 148 mg/dL — ABNORMAL HIGH (ref 70–99)
Glucose-Capillary: 99 mg/dL (ref 70–99)
Glucose-Capillary: 113 mg/dL — ABNORMAL HIGH (ref 70–99)

## 2014-07-29 MED ORDER — APIXABAN 5 MG PO TABS: 5.0000 mg | ORAL_TABLET | Freq: Two times a day (BID) | ORAL | Status: DC

## 2014-07-29 MED ORDER — APIXABAN 5 MG PO TABS
5.0000 mg | ORAL_TABLET | Freq: Two times a day (BID) | ORAL | Status: DC
  Administered 2014-07-29: 5 mg via ORAL
  Filled 2014-07-29: qty 1

## 2014-07-29 NOTE — Progress Notes (Signed)
Stroke Team Progress Note  HISTORY [name] is a 73 y.o. female with a history of diabetes, hypertension, hyperlipidemia, previous stroke/TIA, and atrial fibrillation. The patient was on Coumadin in the past however this was discontinued approximately one year ago secondary to abnormal uterine bleeding. She has been on aspirin 81 mg daily since that time. The patient was on her way to church with her daughter Thursday evening, 07/25/2014, when her daughter noticed that she was moving slower than normal, was not very talkative, and appeared to be mildly confused. The patient is normally mentally sharp although she has had some mild memory problems over the past couple of years. On Friday other family members noted left lower extremity weakness with a left lower facial droop, and the patient was brought to the emergency department for further evaluation and subsequent admission. Prior to admission the patient was fairly independent and ambulated with a cane occasionally secondary to knee pain. There is no history of falls. An MRI today revealed an acute nonhemorrhagic right anterior cerebral artery infarct. An MRA showed ccclusion of the right A2 segment due to embolus or thrombus, as well as, severe stenosis right middle cerebral artery. Neurology was asked to see the patient. The patient's daughter and son-in-law were in the room. The daughter feels that her mother has improved; however, her mental status is still not back to baseline. She was last known well 07/24/2014, Unable to determine time. Tpa not given due to late arrival   SUBJECTIVE Family member present today. She stated that she was off coumadin because of uterine bleeding that occurred 1 yr ago. Marland Kitchen She was recommended by her OBGYN to have hysterectomy but she did not follow up with the OBGYN doctor and she did not resume coumadin. She has no uterine bleeding since then.  OBJECTIVE Most recent Vital Signs: Filed Vitals:   07/29/14 0109  07/29/14 0350 07/29/14 0555 07/29/14 1015  BP: 144/87  148/62 156/84  Pulse: 60  60 71  Temp: 97.7 F (36.5 C)  98 F (36.7 C) 97.1 F (36.2 C)  TempSrc: Oral  Oral Oral  Resp: 20 19 18 18   Height:      Weight:      SpO2: 100%  98% 100%   CBG (last 3)   Recent Labs  07/28/14 1207 07/28/14 1629 07/29/14 0721  GLUCAP 93 89 99    IV Fluid Intake:     MEDICATIONS  .  stroke: mapping our early stages of recovery book   Does not apply Once  . apixaban  5 mg Oral BID  . aspirin  325 mg Oral Daily  . diltiazem  120 mg Oral Daily  . simvastatin  20 mg Oral QHS  . traMADol  50 mg Oral BID   PRN:  levalbuterol, naproxen  Diet:  Carb Control thin liquids Activity:  Up with assistance DVT Prophylaxis:  Lovenox  CLINICALLY SIGNIFICANT STUDIES Basic Metabolic Panel:   Recent Labs Lab 07/26/14 1631  NA 144  K 4.6  CL 105  CO2 30  GLUCOSE 148*  BUN 29*  CREATININE 1.10  CALCIUM 9.2   Liver Function Tests:   Recent Labs Lab 07/26/14 1631  AST 14  ALT 12  ALKPHOS 66  BILITOT 0.3  PROT 6.7  ALBUMIN 3.0*   CBC:   Recent Labs Lab 07/26/14 1631  WBC 4.1  NEUTROABS 2.6  HGB 13.9  HCT 41.5  MCV 99.8  PLT 170   Coagulation:   Recent Labs  Lab 07/26/14 1631  LABPROT 13.2  INR 1.00   Cardiac Enzymes:   Recent Labs Lab 07/26/14 1631  TROPONINI <0.30   Urinalysis: No results found for this basename: COLORURINE, APPERANCEUR, LABSPEC, PHURINE, GLUCOSEU, HGBUR, BILIRUBINUR, KETONESUR, PROTEINUR, UROBILINOGEN, NITRITE, LEUKOCYTESUR,  in the last 168 hours Lipid Panel    Component Value Date/Time   CHOL 173 07/27/2014 0744   TRIG 74 07/27/2014 0744   HDL 66 07/27/2014 0744   CHOLHDL 2.6 07/27/2014 0744   VLDL 15 07/27/2014 0744   LDLCALC 92 07/27/2014 0744   HgbA1C  Lab Results  Component Value Date   HGBA1C 6.2* 07/26/2014    Urine Drug Screen:     Component Value Date/Time   LABOPIA NONE DETECTED 07/27/2014 0509   COCAINSCRNUR NONE DETECTED  07/27/2014 0509   LABBENZ NONE DETECTED 07/27/2014 0509   AMPHETMU NONE DETECTED 07/27/2014 0509   THCU NONE DETECTED 07/27/2014 0509   LABBARB NONE DETECTED 07/27/2014 0509    Alcohol Level: No results found for this basename: ETH,  in the last 168 hours   Ct Head (brain) Wo Contrast 07/26/2014    Again noted cerebral atrophy and chronic white matter disease. There is new area of decreased attenuation in right frontal lobe white matter measures about 1.5 cm. This is highly suspicious for recent or evolving ischemia. Clinical correlation is necessary. Further evaluation with MRI is recommended as clinically warranted.     Mri Brain Without Contrast 07/27/2014    Acute nonhemorrhagic right anterior cerebral artery infarct. Occlusion of the right A2 segment due to embolus or thrombus.  Chronic microvascular ischemic changes in the white matter.  Severe stenosis right middle cerebral artery.     Mr Jodene Nam Head/brain Wo Cm 07/27/2014    Occlusion of the right A2 segment due to embolus or thrombus.  Chronic microvascular ischemic changes in the white matter.  Severe stenosis right middle cerebral artery.     Carotid Doppler  pending  2D Echocardiogram  Left ventricle: The cavity size was normal. Systolic function wasnormal. The estimated ejection fraction was in the range of 55%to 60%.  Wall motion was normal; there were no regional wallmotion abnormalities.  Doppler parameters are consistent with abnormal left ventricular relaxation (grade 1 diastolic dysfunction).  EKG - Atrial fibrillation. Ventricular response 82 beats per minute. For complete results please see formal report.   Therapy Recommendations - Outpatient occupational and physical therapies. 24 hour per day supervision recommended.   Physical exam General - obese, well developed, in no apparent distress.  Ophthalmologic - not able to see through.  Cardiovascular - irregularly irregular heart rate.  Neurologic Examination:  Mental  Status:  Alert, oriented, thought content appropriate. Speech fluent without evidence of aphasia. Able to follow 3 step commands without difficulty. Some difficulty with simple math problems. Able to recall 2 of 3 objects after 1 minute.  Cranial Nerves:  II: Discs not visualized; Visual fields grossly normal, pupils equal, round, reactive to light and accommodation  III,IV, VI: ptosis not present, extra-ocular motions intact bilaterally  V,VII: smile symmetric, facial light touch sensation normal bilaterally  VIII: hearing normal bilaterally  IX,X: gag reflex present  XI: bilateral shoulder shrug  XII: midline tongue extension  Motor:  Right : Upper extremity 5/5 Left: Upper extremity 5/5  Right: Lower extremity 5/5 left: Lower extremity 5-/5  Tone and bulk:normal tone throughout; no atrophy noted  Sensory: Pinprick and light touch intact throughout, bilaterally  Deep Tendon Reflexes: 2+ and symmetric throughout  Plantars:  Right: downgoing Left: downgoing  Cerebellar:  normal finger-to-nose. Patient could not perform heel-to-shin while seated.  Gait: Deferred   ASSESSMENT [name] is a 73 y.o. female presenting with altered mental status and left lower extremity weakness. TPA therapy was not initiated secondary to improving deficits and late presentation. Imaging confirms an acute nonhemorrhagic right anterior cerebral artery infarct.  Infarct felt to be embolic secondary to atrial fibrillation.  On aspirin 81 mg orally every day prior to admission. Now on aspirin 325 mg orally every day for secondary stroke prevention. Patient with resultant significant improvement in deficits. Stroke work up underway.   Atrial fibrillation - Coumadin discontinued in the past secondary to uterine bleeding. Hx uterine bleeding last year, taken off coumadin at that time. Had D&C to stop the bleeding. Surgery recommended at that time but pt did not follow up.   Diabetes mellitus - hemoglobin  A1c 6.2  Hyperlipidemia - cholesterol 173; LDL 92 - on Zocor prior to admission and currently  Hypertension history  MRA -  Severe stenosis right middle cerebral artery.     Morbid obesity, Body mass index is 55.07 kg/(m^2).   OSA  Hospital day # 3  TREATMENT/PLAN  Continue aspirin 325 mg orally every day for secondary stroke prevention. Consider restarting anticoagulation for afib stroke prevention if ok with OBGYN. If hysteretomy necessary, recommend start of anticoagulation with eliquis since it has less risk of bleeding comparing with coumadin. Also, eliquis has less GIB risk among all NOACs.   Await carotid Dopplers  Continue stroke risk factor modification  Need OP sleepy study for OSA  Need weight loss, counseling provided to pt during hospitalization   OP PT and OT recommended   Ok to discharge from stroke standpoint once carotid doppler completed  Follow up Dr. Erlinda Hong in stroke clinic in 2 months.  Burnetta Sabin, MSN, RN, ANVP-BC, ANP-BC, [facility] Pager: [phone] 07/29/2014 11:33 AM I have personally examined this patient, reviewed notes, independently viewed imaging studies, participated in medical decision making and plan of care. I have made any additions or clarifications directly to the above note. Agree with note above.  Discussed with Dr Kathlen Brunswick plan to start eliquis and referred for outpatient OB/GYN opinion  Antony Contras, MD Medical Director [place] Pager: [phone] 07/29/2014 4:07 PM    To contact Stroke Continuity provider, please refer to [email or web address] After hours, contact General Neurology

## 2014-07-29 NOTE — Clinical Documentation Improvement (Signed)
  Patient diagnosed with embolic CVA admitted with "left leg weakness". In the Coding world this term is considered nonspecific and low weighted. If possible please clarify "weakness" to better reflect severity of illness and risk of mortality in this patient.   Possible Clinical Conditions? - Left leg hemiparesis improved   - Resolving LLE hemiparesis  - Other Condition  Thank You, Ezekiel Ina ,RN Clinical Documentation Specialist:  [phone]  [facility] Information Management

## 2014-07-29 NOTE — Discharge Instructions (Signed)

## 2014-07-29 NOTE — Progress Notes (Signed)
Utilization review completed. Steward Sames, RN, BSN. 

## 2014-07-29 NOTE — Progress Notes (Signed)
Occupational Therapy Treatment Patient Details Name: [name] MRN: [id number] DOB: 1941-06-20 Today's Date: 07/29/2014    History of present illness 73 y.o. female with a history of diabetes, hypertension, hyperlipidemia, previous stroke/TIA, and atrial fibrillation. Admitted with LLE weakness/confusion. MRI revealed Acute nonhemorrhagic right anterior cerebral artery infarct.   OT comments  Pt ambulated to gym to practice tub transfer. Progressing toward goals.   Follow Up Recommendations  Outpatient OT;Supervision/Assistance - 24 hour    Equipment Recommendations  Tub/shower seat    Recommendations for Other Services      Precautions / Restrictions Precautions Precautions: Fall Restrictions Weight Bearing Restrictions: No       Mobility Bed Mobility               General bed mobility comments: not assessed  Transfers Overall transfer level: Modified independent                    Balance                                   ADL Overall ADL's : Needs assistance/impaired     Grooming: Wash/dry hands;Set up;Supervision/safety;Standing               Lower Body Dressing: Moderate assistance;Sitting/lateral leans;With adaptive equipment (socks)   Toilet Transfer: Supervision/safety;Ambulation;Comfort height toilet;Grab bars;RW   Toileting- Clothing Manipulation and Hygiene: Supervision/safety;Sit to/from stand   Tub/ Shower Transfer: Min guard;Ambulation   Functional mobility during ADLs: Supervision/safety;Min guard;Rolling walker (ambulated with and without walker; Min guard for tub) General ADL Comments: Educated on what pt could use for toilet aid. Practiced with sockaid and pt having difficulty pulling sock on-OT assisted in helping pull out sockaid. Educated on energy conservation in session and deep breathing techniques. Recommended someone be with her for tub transfer. Educated on Engineer, materials,      Tourist information centre manager   Behavior During Therapy: WFL for tasks assessed/performed Overall Cognitive Status: Impaired/Different from baseline Area of Impairment: Attention   Current Attention Level: Selective                 Extremity/Trunk Assessment               Exercises     Shoulder Instructions       General Comments      Pertinent Vitals/ Pain       Pain Assessment: 0-10 Pain Score: 5  Pain Location: legs Pain Descriptors / Indicators: Sore Pain Intervention(s): Repositioned  Home Living                                          Prior Functioning/Environment              Frequency Min 2X/week     Progress Toward Goals  OT Goals(current goals can now be found in the care plan section)  Progress towards OT goals: Progressing toward goals  Acute Rehab OT Goals Patient Stated Goal: not stated OT Goal Formulation: With patient Time For Goal Achievement: 08/03/14 Potential to Achieve Goals: Good ADL Goals Pt Will Perform Lower Body Dressing: with modified independence;sit to/from stand Pt Will  Transfer to Toilet: with modified independence;ambulating Pt Will Perform Tub/Shower Transfer: Tub transfer;with supervision;ambulating Additional ADL Goal #1: Pt will participate in further vision assessment.  Plan Discharge plan remains appropriate    Co-evaluation                 End of Session Equipment Utilized During Treatment: Gait belt;Rolling walker   Activity Tolerance Patient limited by fatigue   Patient Left in chair;with call bell/phone within reach;with chair alarm set   Nurse Communication          Time: [phone] OT Time Calculation (min): 22 min  Charges: OT General Charges $OT Visit: 1 Procedure OT Treatments $Self Care/Home Management : 8-22 mins  Benito Mccreedy OTR/L [phone] 07/29/2014, 11:49 AM

## 2014-07-29 NOTE — Care Management Note (Addendum)
  Page 1 of 1   07/29/2014     7:59:07 PM CARE MANAGEMENT NOTE 07/29/2014  Patient:  [name], [name]   Account Number:  [id number]  Date Initiated:  07/29/2014  Documentation initiated by:  Lorne Skeens  Subjective/Objective Assessment:   Patient was admitted with CVA. Lives at home with son     Action/Plan:   Will follow for discharge needs pending PT/OT evals and physician orders.   Anticipated DC Date:  07/29/2014   Anticipated DC Plan:  [place]  CM consult      Choice offered to / List presented to:          [facility] arranged  HH-1 RN  [place].   Status of service:   Medicare Important Message given?  YES (If response is "NO", the following Medicare IM given date fields will be blank) Date Medicare IM given:  07/29/2014 Medicare IM given by:  Lorne Skeens Date Additional Medicare IM given:   Additional Medicare IM given by:    Discharge Disposition:    Per UR Regulation:    If discussed at Long Length of Stay Meetings, dates discussed:    Comments:  07/29/14 Sun RN, MSN, CM- met with patient to discuss home health needs. Patient is agreeable to home health and has chosen [facility]. Mary with [facility] was notified and has accepted the referral.  Order for tub bench noted. Patient is requesting a 3N1 and rolling walker instead.  Dr Karleen Hampshire was paged by patient's RN for orders. Awaiting orders for discharge home today.

## 2014-07-29 NOTE — Progress Notes (Signed)
Pt A&O x4; pt discharge education and instructions completed with pt and daughter at bedside; all voices understanding and denies any questions; pt IV and telemetry removed; pt home equipment not delivered to pt room but per casemanagement [facility]; equipments will be delivered to pt home; pt and daughter informed and both voices understanding. Pt handed her prescription for Eliquis along with stroke print out education; pt discharge home with daughter to transport pt home. Pt transported off unit via wheelchair with daughter and belongings at side. P.Amo Darryl Blumenstein RN.

## 2014-07-29 NOTE — Progress Notes (Signed)
Physical Therapy Treatment Patient Details Name: [name] MRN: [id number] DOB: Jun 08, 1941 Today's Date: 07/29/2014    History of Present Illness 73 y.o. female with a history of diabetes, hypertension, hyperlipidemia, previous stroke/TIA, and atrial fibrillation. Admitted with LLE weakness/confusion. MRI revealed Acute nonhemorrhagic right anterior cerebral artery infarct.    PT Comments    Pt progressing well, one LOB without RW today with min A to correct, mod I level with RW, recommend use of RW consistently for now. Pt ambulated 250' with RW, 2/4 DOE, but pt reports this is her normal. PT will continue to follow.   Follow Up Recommendations  Outpatient PT     Equipment Recommendations  Rolling walker with 5" wheels    Recommendations for Other Services       Precautions / Restrictions Precautions Precautions: Fall Restrictions Weight Bearing Restrictions: No    Mobility  Bed Mobility               General bed mobility comments: not assessed  Transfers Overall transfer level: Modified independent Equipment used: Rolling walker (2 wheeled);None Transfers: Sit to/from Stand Sit to Stand: Modified independent (Device/Increase time)            Ambulation/Gait Ambulation/Gait assistance: Modified independent (Device/Increase time);Min assist Ambulation Distance (Feet): 250 Feet Assistive device: Rolling walker (2 wheeled);None Gait Pattern/deviations: Step-through pattern;Wide base of support Gait velocity: WFL   General Gait Details: without RW, pt with one LOB with min A to correct. Given RW after this and pt ambulated 225' mod I. Recommended to pt that she use RW consistently at this time for safety   Stairs            Wheelchair Mobility    Modified Rankin (Stroke Patients Only)       Balance Overall balance assessment: Needs assistance         Standing balance support: No upper extremity supported;During functional  activity Standing balance-Leahy Scale: Fair Standing balance comment: needs UE support for dynamic activity               High Level Balance Comments: worked on high level activities including throwing and catching within BOS    Cognition Arousal/Alertness: Awake/alert Behavior During Therapy: WFL for tasks assessed/performed Overall Cognitive Status: Within Functional Limits for tasks assessed Area of Impairment: Attention   Current Attention Level: Selective           General Comments: pt followed all commands during session today though had to repeat occasionally due to [place] Exercises - Lower Extremity Quad Sets: AROM;Both;10 reps;Seated Gluteal Sets: AROM;Both;10 reps;Seated;Standing Straight Leg Raises: AROM;Both;5 reps;Seated Hip Flexion/Marching: AROM;Both;10 reps;Standing Heel Raises: AROM;Both;10 reps;Standing    General Comments        Pertinent Vitals/Pain Pain Assessment: No/denies pain Pain Score: 5  Pain Location: legs Pain Descriptors / Indicators: Sore Pain Intervention(s): Repositioned    Home Living                      Prior Function            PT Goals (current goals can now be found in the care plan section) Acute Rehab PT Goals Patient Stated Goal: return home PT Goal Formulation: With patient Time For Goal Achievement: 08/04/14 Potential to Achieve Goals: Good Progress towards PT goals: Progressing toward goals    Frequency  Min 4X/week    PT Plan Current plan remains appropriate    Co-evaluation  End of Session   Activity Tolerance: Patient tolerated treatment well Patient left: in chair;with chair alarm set;with call bell/phone within reach     Time: 1334-1358 PT Time Calculation (min): 24 min  Charges:  $Gait Training: 8-22 mins $Therapeutic Exercise: 8-22 mins                    G Codes:     Leighton Roach, PT  Acute Rehab Services  [place],  [place] 07/29/2014, 2:12 PM

## 2014-08-05 NOTE — Discharge Summary (Signed)
Physician Discharge Summary  [name] TGG:[id number] DOB: Dec 17, 1940 DOA: 07/26/2014  PCP: Rosita Fire, MD  Admit date: 07/26/2014 Discharge date: 07/29/2014  Time spent: 30 minutes  Recommendations for Outpatient Follow-up:  1. Follow up with PCP in one week  Discharge Diagnoses:  Active Problems:   Left leg weakness   Right frontal lobe lesion   Cerebral embolism with cerebral infarction   Discharge Condition: improved.     Diet recommendation: regular.   Filed Weights   07/26/14 1555  Weight: 127.914 kg (282 lb)    History of present illness:   [name] is a 73 y.o. female history of type II diet controlled diabetes, hypertension, asthma, atrial fibrillation not on Coumadin, hyperlipidemia. Patient presents to the hospital with a 24 hour history of left leg weakness and shaking that started at church. On the way home from church the daughter noticed that she was less responsive and had difficulty answering questions. She was admitted for stroke work up.   Hospital Course:  1 left leg weakness  #2 right frontal lobe lesion - suspicious for ischemic stroke  With the left leg weakness and no right frontal lobe lesion, there is extreme concern for stroke stroke. Given that, MRI is indicated. [facility] And stroke work up started. Shew as found to have acute stroke int he right anterior cerebral artery infarct . As per discussion with neurology we will start eliquis. Because she had a h/o DUB with D &C, recommended outpatient follow up with ob gyn. This was related to the patient's daughter and other family members multiple times.  #3 atrial fibrillation  Patient is currently rate controlled on Cardizem  #4 diabetes - diet controlled  We'll continue with diet control.  #5 hypertension  Continue Cardizem    Procedures:  MRI brain.   Consultations: neurology  Discharge Exam: Filed Vitals:   07/29/14 1406  BP: 154/73  Pulse: 78   Temp: 97.4 F (36.3 C)  Resp: 18    General: alert afebrile comfortable Cardiovascular: s1s2 Respiratory: ctab  Discharge Instructions You were cared for by a hospitalist during your hospital stay. If you have any questions about your discharge medications or the care you received while you were in the hospital after you are discharged, you can call the unit and asked to speak with the hospitalist on call if the hospitalist that took care of you is not available. Once you are discharged, your primary care physician will handle any further medical issues. Please note that NO REFILLS for any discharge medications will be authorized once you are discharged, as it is imperative that you return to your primary care physician (or establish a relationship with a primary care physician if you do not have one) for your aftercare needs so that they can reassess your need for medications and monitor your lab values.  Discharge Instructions   Discharge instructions    Complete by:  As directed   We have started you on eliquis/ blood thinner and it is very important that you follow up with your ob/gyn. Please follow up with them in 1 to 2 weeks.            Medication List    STOP taking these medications       aspirin EC 81 MG tablet      TAKE these medications       ALEVE 220 MG tablet  Generic drug:  naproxen sodium  Take 220 mg by mouth  2 (two) times daily as needed (for pain).     apixaban 5 MG Tabs tablet  Commonly known as:  ELIQUIS  Take 1 tablet (5 mg total) by mouth 2 (two) times daily.     cholecalciferol 1000 UNITS tablet  Commonly known as:  VITAMIN D  Take 1,000 Units by mouth every morning.     diltiazem 120 MG 24 hr capsule  Commonly known as:  TIAZAC  Take 120 mg by mouth daily.     levalbuterol 45 MCG/ACT inhaler  Commonly known as:  XOPENEX HFA  Inhale 2 puffs into the lungs every 6 (six) hours as needed for wheezing or shortness of breath.     simvastatin 20  MG tablet  Commonly known as:  ZOCOR  Take 20 mg by mouth at bedtime.     traMADol 50 MG tablet  Commonly known as:  ULTRAM  Take 50 mg by mouth 2 (two) times daily.       Allergies  Allergen Reactions  . Imitrex [Sumatriptan] Other (See Comments)    Unknown Reaction   . Mango Flavor     Nausea and vomiting  . Oysters [Shellfish Allergy]     Nausea and vomiting       Follow-up Information   Follow up with FANTA,TESFAYE, MD. Schedule an appointment as soon as possible for a visit in 1 week.   Specialty:  Internal Medicine   Contact information:   [place] [address] [phone]       Follow up with Xu,Jindong, MD. Schedule an appointment as soon as possible for a visit in 2 months.   Specialty:  Neurology   Contact information:   [address] [place] [address] [phone]        The results of significant diagnostics from this hospitalization (including imaging, microbiology, ancillary and laboratory) are listed below for reference.    Significant Diagnostic Studies: Ct Head (brain) Wo Contrast  07/26/2014   CLINICAL DATA:  Leg weakness, visual disturbance  EXAM: CT HEAD WITHOUT CONTRAST  TECHNIQUE: Contiguous axial images were obtained from the base of the skull through the vertex without intravenous contrast.  COMPARISON:  03/21/2013  FINDINGS: No skull fracture is noted. There is mucosal thickening bilateral ethmoid air cells. The mastoid air cells are unremarkable. No intracranial hemorrhage, mass effect or midline shift. Again noted cerebral atrophy. Patchy periventricular and subcortical white matter decreased attenuation consistent with chronic small vessel ischemic changes again noted. There is new area of decreased attenuation in right frontal lobe white matter measures about 1.5 cm. This is highly suspicious for evolving white matter ischemia. Clinical correlation is necessary. Further evaluation with MRI is  recommended. Please see image 15. No mass lesion is noted on this unenhanced scan.  IMPRESSION: 1. Again noted cerebral atrophy and chronic white matter disease. There is new area of decreased attenuation in right frontal lobe white matter measures about 1.5 cm. This is highly suspicious for recent or evolving ischemia. Clinical correlation is necessary. Further evaluation with MRI is recommended as clinically warranted.   Electronically Signed   By: Lahoma Crocker M.D.   On: 07/26/2014 16:23   Mri Brain Without Contrast  07/27/2014   CLINICAL DATA:  Left leg weakness.  Stroke  EXAM: MRI HEAD WITHOUT CONTRAST  MRA HEAD WITHOUT CONTRAST  TECHNIQUE: Multiplanar, multiecho pulse sequences of the brain and surrounding structures were obtained without intravenous contrast. Angiographic images of the head were obtained using MRA  technique without contrast.  COMPARISON:  CT Apr 25, 2014  FINDINGS: MRI HEAD FINDINGS  Acute infarct in the right anterior cerebral artery territory involving the anterior corpus callosum and right frontal white matter. No associated hemorrhage. No other acute infarct.  Clot is seen in the right A2 segment compatible with occlusion due to embolus or thrombus.  Ventricle size is normal. Chronic microvascular ischemic changes throughout the white matter. Dilated perivascular spaces in the basal ganglia. Brainstem and cerebellum are normal.  Negative for mass or edema.  No shift of the midline structures.  Mild mucosal edema in the paranasal sinuses and right mastoid sinus.  MRA HEAD FINDINGS  Both vertebral arteries are patent to the basilar. Basilar widely patent. AICA patent bilaterally. Superior cerebellar and posterior cerebral arteries are patent bilaterally. Mild stenosis in the left posterior cerebral artery.  Mild atherosclerotic disease in the cavernous carotid bilaterally without significant stenosis.  Mild atherosclerotic disease in the right A1 segment. Occlusion of the right A2 segment  due to embolus or thrombus. This is the cause of the right anterior cerebral artery infarct.  Severe stenosis right MCA bifurcation with focal area of signal void. This is just past the right temporal branch.  Left anterior cerebral artery widely patent. Left middle cerebral artery patent with mild atherosclerotic disease.  Negative for aneurysm  IMPRESSION: Acute nonhemorrhagic right anterior cerebral artery infarct. Occlusion of the right A2 segment due to embolus or thrombus.  Chronic microvascular ischemic changes in the white matter.  Severe stenosis right middle cerebral artery.   Electronically Signed   By: Franchot Gallo M.D.   On: 07/27/2014 13:26   Mr Jodene Nam Head/brain Wo Cm  07/27/2014   CLINICAL DATA:  Left leg weakness.  Stroke  EXAM: MRI HEAD WITHOUT CONTRAST  MRA HEAD WITHOUT CONTRAST  TECHNIQUE: Multiplanar, multiecho pulse sequences of the brain and surrounding structures were obtained without intravenous contrast. Angiographic images of the head were obtained using MRA technique without contrast.  COMPARISON:  CT Apr 25, 2014  FINDINGS: MRI HEAD FINDINGS  Acute infarct in the right anterior cerebral artery territory involving the anterior corpus callosum and right frontal white matter. No associated hemorrhage. No other acute infarct.  Clot is seen in the right A2 segment compatible with occlusion due to embolus or thrombus.  Ventricle size is normal. Chronic microvascular ischemic changes throughout the white matter. Dilated perivascular spaces in the basal ganglia. Brainstem and cerebellum are normal.  Negative for mass or edema.  No shift of the midline structures.  Mild mucosal edema in the paranasal sinuses and right mastoid sinus.  MRA HEAD FINDINGS  Both vertebral arteries are patent to the basilar. Basilar widely patent. AICA patent bilaterally. Superior cerebellar and posterior cerebral arteries are patent bilaterally. Mild stenosis in the left posterior cerebral artery.  Mild  atherosclerotic disease in the cavernous carotid bilaterally without significant stenosis.  Mild atherosclerotic disease in the right A1 segment. Occlusion of the right A2 segment due to embolus or thrombus. This is the cause of the right anterior cerebral artery infarct.  Severe stenosis right MCA bifurcation with focal area of signal void. This is just past the right temporal branch.  Left anterior cerebral artery widely patent. Left middle cerebral artery patent with mild atherosclerotic disease.  Negative for aneurysm  IMPRESSION: Acute nonhemorrhagic right anterior cerebral artery infarct. Occlusion of the right A2 segment due to embolus or thrombus.  Chronic microvascular ischemic changes in the white matter.  Severe stenosis right middle  cerebral artery.   Electronically Signed   By: Franchot Gallo M.D.   On: 07/27/2014 13:26    Microbiology: No results found for this or any previous visit (from the past 240 hour(s)).   Labs: Basic Metabolic Panel: No results found for this basename: NA, K, CL, CO2, GLUCOSE, BUN, CREATININE, CALCIUM, MG, PHOS,  in the last 168 hours Liver Function Tests: No results found for this basename: AST, ALT, ALKPHOS, BILITOT, PROT, ALBUMIN,  in the last 168 hours No results found for this basename: LIPASE, AMYLASE,  in the last 168 hours No results found for this basename: AMMONIA,  in the last 168 hours CBC: No results found for this basename: WBC, NEUTROABS, HGB, HCT, MCV, PLT,  in the last 168 hours Cardiac Enzymes: No results found for this basename: CKTOTAL, CKMB, CKMBINDEX, TROPONINI,  in the last 168 hours BNP: BNP (last 3 results) No results found for this basename: PROBNP,  in the last 8760 hours CBG:  Recent Labs Lab 07/29/14 0721 07/29/14 1144  GLUCAP 99 113*       Signed:  Raniyah Curenton  [facility] 08/05/2014, 12:37 AM

## 2014-08-07 ENCOUNTER — Other Ambulatory Visit (HOSPITAL_COMMUNITY): Payer: Self-pay | Admitting: Internal Medicine

## 2014-08-07 DIAGNOSIS — Z1231 Encounter for screening mammogram for malignant neoplasm of breast: Secondary | ICD-10-CM

## 2014-08-14 ENCOUNTER — Encounter: Payer: Medicare Other | Admitting: Cardiology

## 2014-08-14 NOTE — Progress Notes (Signed)
ERROR

## 2014-08-15 ENCOUNTER — Ambulatory Visit (HOSPITAL_COMMUNITY): Payer: Medicare HMO

## 2014-08-15 ENCOUNTER — Encounter: Payer: Medicare Other | Admitting: Cardiology

## 2014-08-15 NOTE — Progress Notes (Signed)
ERROR

## 2014-09-02 ENCOUNTER — Encounter: Payer: Self-pay | Admitting: *Deleted

## 2014-09-02 ENCOUNTER — Encounter: Payer: Medicare Other | Admitting: Cardiology

## 2014-09-02 NOTE — Progress Notes (Signed)
ERROR

## 2014-09-06 ENCOUNTER — Ambulatory Visit (INDEPENDENT_AMBULATORY_CARE_PROVIDER_SITE_OTHER): Payer: Medicare HMO | Admitting: Cardiology

## 2014-09-06 ENCOUNTER — Encounter: Payer: Self-pay | Admitting: Cardiology

## 2014-09-06 VITALS — BP 146/88 | HR 77 | Ht 60.0 in | Wt 293.1 lb

## 2014-09-06 DIAGNOSIS — Z8673 Personal history of transient ischemic attack (TIA), and cerebral infarction without residual deficits: Secondary | ICD-10-CM

## 2014-09-06 DIAGNOSIS — I10 Essential (primary) hypertension: Secondary | ICD-10-CM

## 2014-09-06 DIAGNOSIS — I4891 Unspecified atrial fibrillation: Secondary | ICD-10-CM

## 2014-09-06 DIAGNOSIS — E785 Hyperlipidemia, unspecified: Secondary | ICD-10-CM

## 2014-09-06 NOTE — Progress Notes (Signed)
Clinical Summary [name] is a 73 y.o.female seen today as a new patient for the following medical problems.   1. Afib  - denies any palpitations. Denies any lightheadedness or dizziness.  - compliant with eliquis, denies any bleeding issues  2. Hx of CVA  - admit 07/2014 with left leg weakness, found to have acute CVA right anterior cerebral artery. Concern for cardioemblic CVA. History of afib, was not on anticoag at that time - since has been on eliquis, compliant   3. HTN  - compliant with meds - checks bp at home daily, typically around 110s/90s  4. Hyperlipidemia - followed by pcp 07/2014 TC 173 TG 74 HDL 66 LDL 126 - compliant with statin  Past Medical History  Diagnosis Date  . Diabetes mellitus   . Asthma   . Chronic knee pain   . Back pain, chronic   . Hypertension   . Vertigo   . Hyperlipidemia   . TIA (transient ischemic attack)   . Anemia   . HOH (hard of hearing)   . Arthritis      Allergies  Allergen Reactions  . Imitrex [Sumatriptan] Other (See Comments)    Unknown Reaction   . Mango Flavor     Nausea and vomiting  . Oysters [Shellfish Allergy]     Nausea and vomiting     Current Outpatient Prescriptions  Medication Sig Dispense Refill  . apixaban (ELIQUIS) 5 MG TABS tablet Take 1 tablet (5 mg total) by mouth 2 (two) times daily.  60 tablet  1  . cholecalciferol (VITAMIN D) 1000 UNITS tablet Take 1,000 Units by mouth every morning.       . diltiazem (TIAZAC) 120 MG 24 hr capsule Take 120 mg by mouth daily.      Marland Kitchen levalbuterol (XOPENEX HFA) 45 MCG/ACT inhaler Inhale 2 puffs into the lungs every 6 (six) hours as needed for wheezing or shortness of breath.  1 Inhaler  3  . naproxen sodium (ALEVE) 220 MG tablet Take 220 mg by mouth 2 (two) times daily as needed (for pain).       . simvastatin (ZOCOR) 20 MG tablet Take 20 mg by mouth at bedtime.       . traMADol (ULTRAM) 50 MG tablet Take 50 mg by mouth 2 (two) times daily.        No  current facility-administered medications for this visit.     Past Surgical History  Procedure Laterality Date  . Appendectomy    . Ovary surgery    . Cataract extraction w/phaco Left 01/16/2013    Procedure: CATARACT EXTRACTION PHACO AND INTRAOCULAR LENS PLACEMENT (IOC);  Surgeon: Elta Guadeloupe T. Gershon Crane, MD;  Location: [facility];  Service: Ophthalmology;  Laterality: Left;  CDE:14.37  . Endometrial biopsy  04/03/2013       . Hysteroscopy w/d&c N/A 04/24/2013    Procedure: SUCTION DILATATION AND CURETTAGE /HYSTEROSCOPY;  Surgeon: Jonnie Kind, MD;  Location: [facility];  Service: Gynecology;  Laterality: N/A;  . Knee arthroscopy with medial menisectomy Left 07/26/2013    Procedure: KNEE ARTHROSCOPY WITH PARTIAL MEDIAL MENISECTOMY;  Surgeon: Sanjuana Kava, MD;  Location: [facility];  Service: Orthopedics;  Laterality: Left;     Allergies  Allergen Reactions  . Imitrex [Sumatriptan] Other (See Comments)    Unknown Reaction   . Mango Flavor     Nausea and vomiting  . Oysters [Shellfish Allergy]     Nausea and vomiting      Family  History  Problem Relation Age of Onset  . Arrhythmia Father     Atrial fib/Pacemaker  . Diabetes Father   . Heart failure Mother   . Diabetes Mother   . Diabetes Sister      Social History [name] reports that she has quit smoking. Her smoking use included Cigarettes. She has a 7.5 pack-year smoking history. She has never used smokeless tobacco. [name] reports that she does not drink alcohol.   Review of Systems CONSTITUTIONAL: No weight loss, fever, chills, weakness or fatigue.  HEENT: Eyes: No visual loss, blurred vision, double vision or yellow sclerae.No hearing loss, sneezing, congestion, runny nose or sore throat.  SKIN: No rash or itching.  CARDIOVASCULAR: per HPI RESPIRATORY: No shortness of breath, cough or sputum.  GASTROINTESTINAL: No anorexia, nausea, vomiting or diarrhea. No abdominal pain or blood.  GENITOURINARY: No burning on  urination, no polyuria NEUROLOGICAL: No headache, dizziness, syncope, paralysis, ataxia, numbness or tingling in the extremities. No change in bowel or bladder control.  MUSCULOSKELETAL: No muscle, back pain, joint pain or stiffness.  LYMPHATICS: No enlarged nodes. No history of splenectomy.  PSYCHIATRIC: No history of depression or anxiety.  ENDOCRINOLOGIC: No reports of sweating, cold or heat intolerance. No polyuria or polydipsia.  Marland Kitchen   Physical Examination p 77 bp 146/88 Wt 293 lbs BMI 57 Gen: resting comfortably, no acute distress HEENT: no scleral icterus, pupils equal round and reactive, no palptable cervical adenopathy,  CV: RRR, no m/r/g, no JVD, no carotid bruits Resp: Clear to auscultation bilaterally GI: abdomen is soft, non-tender, non-distended, normal bowel sounds, no hepatosplenomegaly MSK: extremities are warm, no edema.  Skin: warm, no rash Neuro:  no focal deficits Psych: appropriate affect   Diagnostic Studies 07/2014 Echo Study Conclusions  - Left ventricle: The cavity size was normal. Systolic function was normal. The estimated ejection fraction was in the range of 55% to 60%. Wall motion was normal; there were no regional wall motion abnormalities. Doppler parameters are consistent with abnormal left ventricular relaxation (grade 1 diastolic dysfunction).     Assessment and Plan  1. Afib - no current symptoms, continue rate control with dilt and anticoag with eliquis  2. Hx of CVA - no residual deficits. Continue eliquis in setting of afib  3. HTN - at goal based on home numbers - given bp log to continue to keep track - if additional agent needed, consider ACE-I given her history of prior CVA and pre-diabetes  4. Hyperlipidemia - followed by pcp, compliant with zocor - given her history of CVA (clinicla ASCVD), would consider high dose statin for her based on most recent lipid guidelines (lipitor 80 or crestor 20)      Arnoldo Lenis,  M.D.

## 2014-09-06 NOTE — Patient Instructions (Signed)
Your physician wants you to follow-up in: 1 year You will receive a reminder letter in the mail two months in advance. If you don't receive a letter, please call our office to schedule the follow-up appointment.     Your physician recommends that you continue on your current medications as directed. Please refer to the Current Medication list given to you today.     Please keep daily BP log and show Dr.Fanta at next visit     Thank you for choosing [facility] !

## 2014-09-20 ENCOUNTER — Telehealth: Payer: Self-pay | Admitting: Cardiology

## 2014-09-20 NOTE — Telephone Encounter (Signed)
Please call patient back regarding bleeding after starting new "blood thinner". / tgs

## 2014-09-20 NOTE — Telephone Encounter (Signed)
Pt concerned since she is on Eliquis that she is urinating frequently and when she wipes,she believes she may see blood.States her blood sugars are running normal 85,91.Denies GIB,gum bleeding,epistaxis. I suggested she call pcp and perhaps they may want to do a U/A.he states she is still trying to reach their office.I will forward this to their office to contact her    Will FYI Dr.Branch

## 2014-09-23 NOTE — Telephone Encounter (Signed)
Agree, is noticing any hematuria should follow up with pcp to varify and further evaluation  Zandra Abts MD

## 2014-10-24 ENCOUNTER — Telehealth: Payer: Self-pay

## 2014-10-24 NOTE — Telephone Encounter (Signed)
Patient called for DS. She had received a letter to set up her tcs. [phone]

## 2014-10-25 NOTE — Telephone Encounter (Signed)
Pt called and said she had a colonoscopy about 10 years ago in [place] and she is not sure exactly where. She had 4 polyps but she said it was not the bad kind. She is on Eloquis and is scheduled an OV with Neil Crouch, PA on 12/10/2014 at 9:00 am.

## 2014-10-28 ENCOUNTER — Telehealth: Payer: Self-pay | Admitting: *Deleted

## 2014-10-28 NOTE — Telephone Encounter (Signed)
Pt states she is on eliquis and has been passing blood in urine.

## 2014-10-28 NOTE — Telephone Encounter (Signed)
She can hold her eliquis for now. She will need an appointment with her pcp to workup the hematuria, even though she is on eliquis it is still not normal to have blood in urine and suggests there may be something else going on.   Zandra Abts MD

## 2014-10-28 NOTE — Telephone Encounter (Signed)
Pt made aware to hold Eliquis for now. Pt will call for an appt with primary.

## 2014-10-28 NOTE — Telephone Encounter (Signed)
Pt has hematuria since Friday. Pt called pcp and was told to call our office. Pt is now having to wear pads where bleeding has increased. Is scheduled for colonoscopy in January. Will forward to Dr. Harl Bowie

## 2014-11-07 ENCOUNTER — Ambulatory Visit (HOSPITAL_COMMUNITY): Payer: Medicare HMO

## 2014-11-08 ENCOUNTER — Ambulatory Visit (HOSPITAL_COMMUNITY): Payer: Medicare HMO

## 2014-11-13 ENCOUNTER — Ambulatory Visit (HOSPITAL_COMMUNITY)
Admission: RE | Admit: 2014-11-13 | Discharge: 2014-11-13 | Disposition: A | Discharge status: Home / Self Care | Payer: Commercial Managed Care - HMO | Source: Ambulatory Visit | Attending: Internal Medicine | Admitting: Internal Medicine

## 2014-11-13 DIAGNOSIS — Z1231 Encounter for screening mammogram for malignant neoplasm of breast: Secondary | ICD-10-CM | POA: Diagnosis not present

## 2014-11-26 ENCOUNTER — Ambulatory Visit (INDEPENDENT_AMBULATORY_CARE_PROVIDER_SITE_OTHER): Payer: Medicare HMO | Admitting: Adult Health

## 2014-11-26 ENCOUNTER — Encounter: Payer: Self-pay | Admitting: Adult Health

## 2014-11-26 VITALS — BP 182/82 | Ht 59.0 in | Wt 292.5 lb

## 2014-11-26 DIAGNOSIS — N950 Postmenopausal bleeding: Secondary | ICD-10-CM

## 2014-11-26 DIAGNOSIS — N3941 Urge incontinence: Secondary | ICD-10-CM

## 2014-11-26 HISTORY — DX: Postmenopausal bleeding: N95.0

## 2014-11-26 HISTORY — DX: Urge incontinence: N39.41

## 2014-11-26 NOTE — Patient Instructions (Signed)
Postmenopausal Bleeding Postmenopausal bleeding is any bleeding a woman has after she has entered into menopause. Menopause is the end of a woman's fertile years. After menopause, a woman no longer ovulates or has menstrual periods.  Postmenopausal bleeding can be caused by various things. Any type of postmenopausal bleeding, even if it appears to be a typical menstrual period, is concerning. This should be evaluated by your health care provider. Any treatment will depend on the cause of the bleeding. HOME CARE INSTRUCTIONS Monitor your condition for any changes. The following actions may help to alleviate any discomfort you are experiencing:  Avoid the use of tampons and douches as directed by your health care provider.  Change your pads frequently.  Get regular pelvic exams and Pap tests.  Keep all follow-up appointments for diagnostic tests as directed by your health care provider. SEEK MEDICAL CARE IF:   Your bleeding lasts more than 1 week.  You have abdominal pain.  You have bleeding with sexual intercourse. SEEK IMMEDIATE MEDICAL CARE IF:   You have a fever, chills, headache, dizziness, muscle aches, and bleeding.  You have severe pain with bleeding.  You are passing blood clots.  You have bleeding and need more than 1 pad an hour.  You feel faint. MAKE SURE YOU:  Understand these instructions.  Will watch your condition.  Will get help right away if you are not doing well or get worse. Document Released: 03/02/2006 Document Revised: 09/12/2013 Document Reviewed: 06/21/2013 [facility] Patient Information 2015 [place], [place]. This information is not intended to replace advice given to you by your health care provider. Make sure you discuss any questions you have with your health care provider. Return in 1 week for gyn [place] and see me

## 2014-11-26 NOTE — Progress Notes (Signed)
Subjective:     Patient ID: [name], female   DOB: 11/06/1941, 73 y.o.   MRN: [id number]  HPI [name] is a 73 year old black female in for pelvic exam, she has had 2-3 episodes of vaginal bleeding,since starting eliquis for an irregular heart beat and is sp "mini stroke" in September.She also wears pull ups for urge incontinence. She had a normal pap with negative HPV 04/19/13.   Review of Systems See HPI Reviewed past medical,surgical, social and family history. Reviewed medications and allergies.     Objective:   Physical Exam BP 182/82 mmHg  Ht 4\' 11"  (1.499 m)  Wt 292 lb 8 oz (132.677 kg)  BMI 59.05 kg/m2  LMP 03/31/2013   Skin warm and dry.Pelvic: external genitalia is normal in appearance, no lesions, vagina: scant discharge without odor, cervix:smooth, uterus: normal size, shape and contour, non tender, no masses felt, adnexa: no masses or tenderness noted.Difficult exam secondary to abdominal girth. Will get [place] to assess uterus and endometrial lining.  Assessment:     PMB UI    Plan:     Return in 1 week for gyn [place] and see me   Review handout on PMB

## 2014-12-05 ENCOUNTER — Ambulatory Visit: Payer: Medicare HMO | Admitting: Adult Health

## 2014-12-05 ENCOUNTER — Other Ambulatory Visit: Payer: Medicare HMO

## 2014-12-10 ENCOUNTER — Ambulatory Visit: Payer: Medicare HMO | Admitting: Adult Health

## 2014-12-10 ENCOUNTER — Ambulatory Visit: Payer: Medicare HMO | Admitting: Gastroenterology

## 2014-12-10 ENCOUNTER — Other Ambulatory Visit: Payer: Medicare HMO

## 2015-01-08 ENCOUNTER — Ambulatory Visit: Payer: Medicare HMO | Admitting: Gastroenterology

## 2015-01-13 ENCOUNTER — Ambulatory Visit (INDEPENDENT_AMBULATORY_CARE_PROVIDER_SITE_OTHER): Payer: Commercial Managed Care - HMO

## 2015-01-13 ENCOUNTER — Ambulatory Visit: Payer: Medicare HMO | Admitting: Adult Health

## 2015-01-13 DIAGNOSIS — N950 Postmenopausal bleeding: Secondary | ICD-10-CM

## 2015-01-15 ENCOUNTER — Ambulatory Visit (INDEPENDENT_AMBULATORY_CARE_PROVIDER_SITE_OTHER): Payer: Commercial Managed Care - HMO | Admitting: Adult Health

## 2015-01-15 ENCOUNTER — Encounter: Payer: Self-pay | Admitting: Adult Health

## 2015-01-15 VITALS — BP 150/92 | Ht 60.0 in | Wt 289.5 lb

## 2015-01-15 DIAGNOSIS — N950 Postmenopausal bleeding: Secondary | ICD-10-CM | POA: Diagnosis not present

## 2015-01-15 DIAGNOSIS — R9389 Abnormal findings on diagnostic imaging of other specified body structures: Secondary | ICD-10-CM

## 2015-01-15 DIAGNOSIS — R938 Abnormal findings on diagnostic imaging of other specified body structures: Secondary | ICD-10-CM | POA: Diagnosis not present

## 2015-01-15 HISTORY — DX: Abnormal findings on diagnostic imaging of other specified body structures: R93.89

## 2015-01-15 NOTE — Progress Notes (Addendum)
Subjective:     Patient ID: [name], female   DOB: May 03, 1941, 74 y.o.   MRN: [id number]  HPI [name] is a 74 year old black female in having had PMB.She had an [place], 01/13/15, and is here to review results and treatment options.  Review of Systems +PMB, +urinary incontinence at times  Reviewed past medical,surgical, social and family history. Reviewed medications and allergies.     Objective:   Physical Exam BP 150/92 mmHg  Ht 5' (1.524 m)  Wt 289 lb 8 oz (131.316 kg)  BMI 56.54 kg/m2  LMP 03/31/2013   Reviewed [place] with pt. Uterus 7.4 x 6.1 x 4.3 cm, anteverted with 1.9cm fibroid noted  Endometrium 9.5 mm, symmetrical, no obvious solitary/single mass noted within  Right ovary Unable to view Rt ovarian tissue although Rt hydrosalpinx noted = 6.2 x 1.4 cm,   Left ovary 2.0 x 1.7 x 1.2 cm,   No free fluid noted within the pelvis  Technician Comments:  Anteverted uterus With fibroid noted, Endometrium=9.75mm, Rt hydrosalpinx noted, Lt ovary appears WNL, no free fluid noted within the pelvis Will schedule endo biopsy to assess thickened endometrium, to rule out polyp,hyperplasia or endometrial cancer.Discussed how endo biopsy to done and possible, options after results back. Face to face time 15 minutes with pt. Discussing [place] and treatment plan.  Assessment:     PMB Thickened endometrium    Plan:     Review handout on endometrial biopsy Return 2/15 at 2:15 pm for endo biopsy with Dr Elonda Husky Take tylenol or advil before visit to lessen cramps

## 2015-01-15 NOTE — Addendum Note (Signed)
Addended by: Derrek Monaco A on: 01/15/2015 01:52 PM   Modules accepted: Level of Service

## 2015-01-15 NOTE — Patient Instructions (Signed)
Return 2/15 for endo biopsy with Dr Elonda Husky

## 2015-01-16 DIAGNOSIS — H26492 Other secondary cataract, left eye: Secondary | ICD-10-CM | POA: Diagnosis not present

## 2015-01-16 DIAGNOSIS — H2511 Age-related nuclear cataract, right eye: Secondary | ICD-10-CM | POA: Diagnosis not present

## 2015-01-16 DIAGNOSIS — H25011 Cortical age-related cataract, right eye: Secondary | ICD-10-CM | POA: Diagnosis not present

## 2015-01-16 DIAGNOSIS — H524 Presbyopia: Secondary | ICD-10-CM | POA: Diagnosis not present

## 2015-01-20 ENCOUNTER — Other Ambulatory Visit: Payer: Medicare HMO | Admitting: Obstetrics & Gynecology

## 2015-02-04 ENCOUNTER — Encounter: Payer: Self-pay | Admitting: Obstetrics & Gynecology

## 2015-02-04 ENCOUNTER — Other Ambulatory Visit: Payer: Self-pay | Admitting: Obstetrics & Gynecology

## 2015-02-04 ENCOUNTER — Ambulatory Visit (INDEPENDENT_AMBULATORY_CARE_PROVIDER_SITE_OTHER): Payer: Commercial Managed Care - HMO | Admitting: Obstetrics & Gynecology

## 2015-02-04 VITALS — BP 140/80 | HR 72 | Wt 293.0 lb

## 2015-02-04 DIAGNOSIS — N950 Postmenopausal bleeding: Secondary | ICD-10-CM | POA: Diagnosis not present

## 2015-02-04 DIAGNOSIS — R938 Abnormal findings on diagnostic imaging of other specified body structures: Secondary | ICD-10-CM | POA: Diagnosis not present

## 2015-02-04 DIAGNOSIS — R9389 Abnormal findings on diagnostic imaging of other specified body structures: Secondary | ICD-10-CM

## 2015-02-04 DIAGNOSIS — N8500 Endometrial hyperplasia, unspecified: Secondary | ICD-10-CM

## 2015-02-04 NOTE — Addendum Note (Signed)
Addended by: Farley Ly on: 02/04/2015 09:36 AM   Modules accepted: Orders

## 2015-02-04 NOTE — Addendum Note (Signed)
Addended by: Diona Fanti A on: 02/04/2015 09:52 AM   Modules accepted: Medications

## 2015-02-04 NOTE — Progress Notes (Signed)
Patient ID: [name], female   DOB: Mar 25, 1941, 74 y.o.   MRN: [id number] Endometrial Biopsy Procedure Note  Pre-operative Diagnosis: Post menopausal bleeding with 9.5 mm endometrial stripe, homogenous symmetrical  Post-operative Diagnosis: same  Indications: postmenopausal bleeding  Procedure Details   Urine pregnancy test was not done.  The risks (including infection, bleeding, pain, and uterine perforation) and benefits of the procedure were explained to the patient and Written informed consent was obtained.  Antibiotic prophylaxis against endocarditis was not indicated.   The patient was placed in the dorsal lithotomy position.  Bimanual exam showed the uterus to be in the neutral position.  A Graves' speculum inserted in the vagina, and the cervix prepped with povidone iodine.  Endocervical curettage with a Kevorkian curette was not performed.   A sharp tenaculum was applied to the anterior lip of the cervix for stabilization.  A sterile uterine sound was used to sound the uterus to a depth of 6cm.  A Pipelle endometrial aspirator was used to sample the endometrium.  Sample was sent for pathologic examination.  Condition: Stable  Complications: None  Plan:  The patient was advised to call for any fever or for prolonged or severe pain or bleeding. She was advised to use OTC ibuprofen as needed for mild to moderate pain. She was advised to avoid vaginal intercourse for 48 hours or until the bleeding has completely stopped.  Follow up 1 week for path report discussion  Attending Physician Documentation: I was present for or performed the following: endometrial biopsy

## 2015-02-07 ENCOUNTER — Ambulatory Visit (INDEPENDENT_AMBULATORY_CARE_PROVIDER_SITE_OTHER): Payer: Commercial Managed Care - HMO | Admitting: Gastroenterology

## 2015-02-07 ENCOUNTER — Encounter: Payer: Self-pay | Admitting: Gastroenterology

## 2015-02-07 ENCOUNTER — Other Ambulatory Visit: Payer: Self-pay

## 2015-02-07 VITALS — BP 153/85 | HR 63 | Temp 97.0°F | Ht 60.0 in | Wt 289.4 lb

## 2015-02-07 DIAGNOSIS — R194 Change in bowel habit: Secondary | ICD-10-CM

## 2015-02-07 MED ORDER — PEG 3350-KCL-NA BICARB-NACL 420 G PO SOLR: 4000.0000 mL | ORAL | Status: DC

## 2015-02-07 NOTE — Progress Notes (Signed)
CC'ED TO PCP 

## 2015-02-07 NOTE — Progress Notes (Signed)
Primary Care Physician:  Rosita Fire, MD  Primary Gastroenterologist:  Barney Drain, MD   Chief Complaint  Patient presents with  . set up tcs    HPI:  [name] is a 74 y.o. female here to schedule colonoscopy. She had one in 2009 by Dr. Wilford Corner, 4 hyperplastic polyps removed. Based on records received, he recommended she have another colonoscopy in 5 years. Patient states she's had issues with her bowels since she had a mini stroke in September. She's had a lot of gas and worsening constipation. Constipation is gradual been worsening of the past several years she retired. She's taken Dulcolax as needed, generally every week. Currently having a bowel movement about every day with this regimen. She denies any blood in the stool or melena. Denies any heartburn, dysphagia, vomiting, weight loss. She had been on Eliquis since her stroke however she was having issues with vaginal bleeding and subsequently has been stopped. She is on aspirin 81 mg daily.   Current Outpatient Prescriptions  Medication Sig Dispense Refill  . aspirin 81 MG tablet Take 81 mg by mouth daily.    Marland Kitchen CARTIA XT 120 MG 24 hr capsule 120 mg daily.     . cholecalciferol (VITAMIN D) 1000 UNITS tablet Take 1,000 Units by mouth every morning.     . levalbuterol (XOPENEX HFA) 45 MCG/ACT inhaler Inhale 2 puffs into the lungs every 6 (six) hours as needed for wheezing or shortness of breath. 1 Inhaler 3  . naproxen sodium (ALEVE) 220 MG tablet Take 220 mg by mouth 2 (two) times daily as needed (for pain).     . simvastatin (ZOCOR) 20 MG tablet Take 20 mg by mouth at bedtime.      No current facility-administered medications for this visit.    Allergies as of 02/07/2015 - Review Complete 02/07/2015  Allergen Reaction Noted  . Imitrex [sumatriptan] Other (See Comments) 04/18/2013  . Mango flavor  04/09/2013  . Oysters [shellfish allergy]  04/09/2013    Past Medical History  Diagnosis Date  . Diabetes  mellitus   . Asthma   . Chronic knee pain   . Back pain, chronic   . Hypertension   . Vertigo   . Hyperlipidemia   . TIA (transient ischemic attack)   . Anemia   . HOH (hard of hearing)   . Arthritis   . Stroke     Mini Stroke  . Obesity   . PMB (postmenopausal bleeding) 11/26/2014  . Urge incontinence 11/26/2014  . Thickened endometrium 01/15/2015    Past Surgical History  Procedure Laterality Date  . Appendectomy    . Ovary surgery    . Cataract extraction w/phaco Left 01/16/2013    Procedure: CATARACT EXTRACTION PHACO AND INTRAOCULAR LENS PLACEMENT (IOC);  Surgeon: Elta Guadeloupe T. Gershon Crane, MD;  Location: [facility];  Service: Ophthalmology;  Laterality: Left;  CDE:14.37  . Endometrial biopsy  04/03/2013       . Hysteroscopy w/d&c N/A 04/24/2013    Procedure: SUCTION DILATATION AND CURETTAGE /HYSTEROSCOPY;  Surgeon: Jonnie Kind, MD;  Location: [facility];  Service: Gynecology;  Laterality: N/A;  . Knee arthroscopy with medial menisectomy Left 07/26/2013    Procedure: KNEE ARTHROSCOPY WITH PARTIAL MEDIAL MENISECTOMY;  Surgeon: Sanjuana Kava, MD;  Location: [facility];  Service: Orthopedics;  Laterality: Left;  . Endometrial ablation    . Colonoscopy  2009    Dr. Wilford Corner: 4 hyperplastic polyps removed. Small internal hemorrhoids. Recommended 5 year follow-up colonoscopy.  Family History  Problem Relation Age of Onset  . Arrhythmia Father     Atrial fib/Pacemaker  . Heart failure Mother   . Diabetes Mother   . Other Mother     fell and broke hip  . Diabetes Sister   . Lupus Daughter   . Mental illness Son   . ADD / ADHD Son   . Other Son     soft bones  . Diabetes Maternal Grandmother   . Stroke Paternal Grandmother   . Arthritis Paternal Grandfather   . Other Sister     MVA  . Hypertension Brother   . Diabetes Sister   . Hypertension Sister   . Colon cancer Neg Hx     History   Social History  . Marital Status: Widowed    Spouse Name: N/A  . Number of Children:  2  . Years of Education: N/A   Occupational History  . retired    Social History Main Topics  . Smoking status: Former Smoker -- 0.25 packs/day for 30 years    Types: Cigarettes  . Smokeless tobacco: Never Used  . Alcohol Use: No  . Drug Use: No  . Sexual Activity: Not Currently    Birth Control/ Protection: Post-menopausal     Comment: ablation   Other Topics Concern  . Not on file   Social History Narrative      ROS:  General: Negative for anorexia, weight loss, fever, chills, fatigue, weakness. Eyes: Negative for vision changes.  ENT: Negative for hoarseness, difficulty swallowing , nasal congestion. CV: Negative for chest pain, angina, palpitations, dyspnea on exertion, peripheral edema.  Respiratory: Negative for dyspnea at rest. Positive dyspnea on exertion. No cough, sputum, wheezing.  GI: See history of present illness. GU:  Negative for dysuria, hematuria, urinary incontinence, urinary frequency, nocturnal urination.  MS: Negative for joint pain, low back pain.  Derm: Negative for rash or itching.  Neuro: Negative for weakness, abnormal sensation, seizure, frequent headaches, memory loss, confusion.  Psych: Negative for anxiety, depression, suicidal ideation, hallucinations.  Endo: Negative for unusual weight change.  Heme: Negative for bruising or bleeding. Allergy: Negative for rash or hives.    Physical Examination:  BP 153/85 mmHg  Pulse 63  Temp(Src) 97 F (36.1 C)  Ht 5' (1.524 m)  Wt 289 lb 6.4 oz (131.271 kg)  BMI 56.52 kg/m2  LMP 03/31/2013   General: Well-nourished, well-developed in no acute distress.  Head: Normocephalic, atraumatic.   Eyes: Conjunctiva pink, no icterus. Mouth: Oropharyngeal mucosa moist and pink , no lesions erythema or exudate. Neck: Supple without thyromegaly, masses, or lymphadenopathy.  Lungs: Clear to auscultation bilaterally.  Heart: Regular rate and rhythm, no murmurs rubs or gallops.  Abdomen: Bowel sounds are  normal, nontender, nondistended, no hepatosplenomegaly or masses, no abdominal bruits or    hernia , no rebound or guarding.  Body habitus limited exam Rectal: Deferred Extremities: No lower extremity edema. No clubbing or deformities.  Neuro: Alert and oriented x 4 , grossly normal neurologically.  Skin: Warm and dry, no rash or jaundice.   Psych: Alert and cooperative, normal mood and affect.  Labs: Lab Results  Component Value Date   WBC 4.1 07/26/2014   HGB 13.9 07/26/2014   HCT 41.5 07/26/2014   MCV 99.8 07/26/2014   PLT 170 07/26/2014   Lab Results  Component Value Date   CREATININE 1.10 07/26/2014   BUN 29* 07/26/2014   NA 144 07/26/2014   K 4.6 07/26/2014  CL 105 07/26/2014   CO2 30 07/26/2014   Lab Results  Component Value Date   ALT 12 07/26/2014   AST 14 07/26/2014   ALKPHOS 66 07/26/2014   BILITOT 0.3 07/26/2014   Lab Results  Component Value Date   INR 1.00 07/26/2014   INR 1.32 04/01/2013   INR 1.43 03/31/2013     Imaging Studies: US Transvaginal Non-ob  02-04-15   GYNECOLOGIC SONOGRAM   [name] is a 74 y.o. G2P2 for a pelvic sonogram for PM bleeding.  Uterus                      7.4 x 6.1 x 4.3 cm, anteverted with 1.9cm  fibroid noted  Endometrium          9.5 mm, symmetrical, no obvious solitary/single mass  noted within  Right ovary             Unable to view Rt ovarian tissue although Rt  hydrosalpinx noted = 6.2 x 1.4  cm,   Left ovary                2.0 x 1.7 x 1.2 cm,   No free fluid noted within the pelvis  Technician Comments:  Anteverted uterus  With fibroid noted, Endometrium=9.25mm, Rt hydrosalpinx  noted, Lt ovary appears WNL, no free fluid noted within the pelvis   Lazarus Gowda 01/13/2015 11:22 AM  Clinical Impression and recommendations:  I have reviewed the sonogram results above.  Combined with the patient's current clinical course, below are my  impressions and any appropriate recommendations for management based on  the sonographic  findings:  1. Small anteflexed retroverted uterus,notable for a: irregular  endometrium with no dominant mass, Slight thickening of endometrium,  warrants endometrial sampling. 2. Stable small tubular fluid structure in right adnexa most consistent  with right hydrosalpinx.   Jonnie Kind

## 2015-02-07 NOTE — Assessment & Plan Note (Signed)
Pleasant 74 year old lady presents for follow-up colonoscopy at the recommendation she's received previously by Dr. Michail Sermon in 2009. Colonoscopy at that time, she had multiple hyperplastic polyps but was advised come back in 5 years. She presents today with some change in bowel habits. Worsening constipation and excessive flatulence. No evidence of anemia. Otherwise feels good from a GI standpoint. Proceed with colonoscopy at this time. I have discussed the risks, alternatives, benefits with regards to but not limited to the risk of reaction to medication, bleeding, infection, perforation and the patient is agreeable to proceed. Written consent to be obtained.  She is not interested in changing her bowel regimen at this time but did offer her a trial of Linzess her Amitiza if needed. She will call me know if she changes her mind. Recommend daily yogurt or probiotic for the next 3-4 weeks to see if this helps her gas. Discussed gas/bloat causing foods. Limit as much as possible. Further recommendations to follow colonoscopy findings.

## 2015-02-07 NOTE — Patient Instructions (Addendum)
1. Colonoscopy as scheduled. See separate instructions. 2. Eat one Dannon yogurt daily for 3-4 weeks or take a probiotic daily for 3-4 weeks. [facility] is a good option. 3. Continue Dulcolax as needed. If you decide you want to start a prescription medication for constipation, please let me know.

## 2015-02-11 ENCOUNTER — Encounter: Payer: Self-pay | Admitting: Obstetrics & Gynecology

## 2015-02-11 ENCOUNTER — Ambulatory Visit (INDEPENDENT_AMBULATORY_CARE_PROVIDER_SITE_OTHER): Payer: Commercial Managed Care - HMO | Admitting: Obstetrics & Gynecology

## 2015-02-11 VITALS — BP 160/80 | HR 72 | Wt 292.0 lb

## 2015-02-11 DIAGNOSIS — N8501 Benign endometrial hyperplasia: Secondary | ICD-10-CM | POA: Diagnosis not present

## 2015-02-11 NOTE — Progress Notes (Signed)
Patient ID: [name], female   DOB: 03-20-1941, 74 y.o.   MRN: [id number]   Decision for surgery and preop H&P:  Preoperative History and Physical  [name] is a 74 y.o. G2P2 with Patient's last menstrual period was 03/31/2013. admitted for a hysteroscopy uterine curettage.  For evaluation of endometrium with diagnosis of simple and  Complex hyperplasia without atypia by office endometrial biopsy  PMH:    Past Medical History  Diagnosis Date  . Diabetes mellitus   . Asthma   . Chronic knee pain   . Back pain, chronic   . Hypertension   . Vertigo   . Hyperlipidemia   . TIA (transient ischemic attack)   . Anemia   . HOH (hard of hearing)   . Arthritis   . Stroke     Mini Stroke  . Obesity   . PMB (postmenopausal bleeding) 11/26/2014  . Urge incontinence 11/26/2014  . Thickened endometrium 01/15/2015    PSH:     Past Surgical History  Procedure Laterality Date  . Appendectomy    . Ovary surgery    . Cataract extraction w/phaco Left 01/16/2013    Procedure: CATARACT EXTRACTION PHACO AND INTRAOCULAR LENS PLACEMENT (IOC);  Surgeon: Elta Guadeloupe T. Gershon Crane, MD;  Location: [facility];  Service: Ophthalmology;  Laterality: Left;  CDE:14.37  . Endometrial biopsy  04/03/2013       . Hysteroscopy w/d&c N/A 04/24/2013    Procedure: SUCTION DILATATION AND CURETTAGE /HYSTEROSCOPY;  Surgeon: Jonnie Kind, MD;  Location: [facility];  Service: Gynecology;  Laterality: N/A;  . Knee arthroscopy with medial menisectomy Left 07/26/2013    Procedure: KNEE ARTHROSCOPY WITH PARTIAL MEDIAL MENISECTOMY;  Surgeon: Sanjuana Kava, MD;  Location: [facility];  Service: Orthopedics;  Laterality: Left;  . Endometrial ablation    . Colonoscopy  2009    Dr. Wilford Corner: 4 hyperplastic polyps removed. Small internal hemorrhoids. Recommended 5 year follow-up colonoscopy.    POb/GynH:      OB History    Gravida Para Term Preterm AB TAB SAB Ectopic Multiple Living   2 2        2       SH:   History   Substance Use Topics  . Smoking status: Former Smoker -- 0.25 packs/day for 30 years    Types: Cigarettes  . Smokeless tobacco: Never Used  . Alcohol Use: No    FH:    Family History  Problem Relation Age of Onset  . Arrhythmia Father     Atrial fib/Pacemaker  . Heart failure Mother   . Diabetes Mother   . Other Mother     fell and broke hip  . Diabetes Sister   . Lupus Daughter   . Mental illness Son   . ADD / ADHD Son   . Other Son     soft bones  . Diabetes Maternal Grandmother   . Stroke Paternal Grandmother   . Arthritis Paternal Grandfather   . Other Sister     MVA  . Hypertension Brother   . Diabetes Sister   . Hypertension Sister   . Colon cancer Neg Hx      Allergies:  Allergies  Allergen Reactions  . Imitrex [Sumatriptan] Other (See Comments)    Unknown Reaction   . Mango Flavor     Nausea and vomiting  . Oysters [Shellfish Allergy]     Nausea and vomiting    Medications:       Current outpatient prescriptions:  .  aspirin 81 MG tablet, Take 81 mg by mouth daily., Disp: , Rfl:  .  CARTIA XT 120 MG 24 hr capsule, 120 mg daily. , Disp: , Rfl:  .  cholecalciferol (VITAMIN D) 1000 UNITS tablet, Take 1,000 Units by mouth every morning. , Disp: , Rfl:  .  levalbuterol (XOPENEX HFA) 45 MCG/ACT inhaler, Inhale 2 puffs into the lungs every 6 (six) hours as needed for wheezing or shortness of breath., Disp: 1 Inhaler, Rfl: 3 .  naproxen sodium (ALEVE) 220 MG tablet, Take 220 mg by mouth 2 (two) times daily as needed (for pain). , Disp: , Rfl:  .  polyethylene glycol-electrolytes (TRILYTE) 420 G solution, Take 4,000 mLs by mouth as directed., Disp: 4000 mL, Rfl: 0 .  simvastatin (ZOCOR) 20 MG tablet, Take 20 mg by mouth at bedtime. , Disp: , Rfl:   Review of Systems:   Review of Systems  Constitutional: Negative for fever, chills, weight loss, malaise/fatigue and diaphoresis.  HENT: Negative for hearing loss, ear pain, nosebleeds, congestion, sore throat,  neck pain, tinnitus and ear discharge.   Eyes: Negative for blurred vision, double vision, photophobia, pain, discharge and redness.  Respiratory: Negative for cough, hemoptysis, sputum production, shortness of breath, wheezing and stridor.   Cardiovascular: Negative for chest pain, palpitations, orthopnea, claudication, leg swelling and PND.  Gastrointestinal: Negative for abdominal pain. Negative for heartburn, nausea, vomiting, diarrhea, constipation, blood in stool and melena.  Genitourinary: Negative for dysuria, urgency, frequency, hematuria and flank pain.  Musculoskeletal: Negative for myalgias, back pain, joint pain and falls.  Skin: Negative for itching and rash.  Neurological: Negative for dizziness, tingling, tremors, sensory change, speech change, focal weakness, seizures, loss of consciousness, weakness and headaches.  Endo/Heme/Allergies: Negative for environmental allergies and polydipsia. Does not bruise/bleed easily.  Psychiatric/Behavioral: Negative for depression, suicidal ideas, hallucinations, memory loss and substance abuse. The patient is not nervous/anxious and does not have insomnia.      PHYSICAL EXAM:  Blood pressure 160/80, pulse 72, weight 292 lb (132.45 kg), last menstrual period 03/31/2013.    Vitals reviewed. Constitutional: She is oriented to person, place, and time. She appears well-developed and well-nourished.  HENT:  Head: Normocephalic and atraumatic.  Right Ear: External ear normal.  Left Ear: External ear normal.  Nose: Nose normal.  Mouth/Throat: Oropharynx is clear and moist.  Eyes: Conjunctivae and EOM are normal. Pupils are equal, round, and reactive to light. Right eye exhibits no discharge. Left eye exhibits no discharge. No scleral icterus.  Neck: Normal range of motion. Neck supple. No tracheal deviation present. No thyromegaly present.  Cardiovascular: Normal rate, regular rhythm, normal heart sounds and intact distal pulses.  Exam  reveals no gallop and no friction rub.   No murmur heard. Respiratory: Effort normal and breath sounds normal. No respiratory distress. She has no wheezes. She has no rales. She exhibits no tenderness.  GI: Soft. Bowel sounds are normal. She exhibits no distension and no mass. There is tenderness. There is no rebound and no guarding.  Genitourinary:       Vulva is normal without lesions Vagina is pink moist without discharge Cervix normal in appearance and pap is normal Uterus is normal by sonogram, obesity Adnexa is negative with normal sized ovaries by sonogram  Musculoskeletal: Normal range of motion. She exhibits no edema and no tenderness.  Neurological: She is alert and oriented to person, place, and time. She has normal reflexes. She displays normal reflexes. No cranial nerve deficit. She exhibits normal  muscle tone. Coordination normal.  Skin: Skin is warm and dry. No rash noted. No erythema. No pallor.  Psychiatric: She has a normal mood and affect. Her behavior is normal. Judgment and thought content normal.    Labs: No results found for this or any previous visit (from the past 336 hour(s)).  EKG: Orders placed or performed during the hospital encounter of 07/26/14  . EKG  . EKG  . EKG    Imaging Studies: US Transvaginal Non-ob  31-Jan-2015   GYNECOLOGIC SONOGRAM   [name] is a 74 y.o. G2P2 for a pelvic sonogram for PM bleeding.  Uterus                      7.4 x 6.1 x 4.3 cm, anteverted with 1.9cm  fibroid noted  Endometrium          9.5 mm, symmetrical, no obvious solitary/single mass  noted within  Right ovary             Unable to view Rt ovarian tissue although Rt  hydrosalpinx noted = 6.2 x 1.4  cm,   Left ovary                2.0 x 1.7 x 1.2 cm,   No free fluid noted within the pelvis  Technician Comments:  Anteverted uterus  With fibroid noted, Endometrium=9.83mm, Rt hydrosalpinx  noted, Lt ovary appears WNL, no free fluid noted within the pelvis   Lazarus Gowda  01/13/2015 11:22 AM  Clinical Impression and recommendations:  I have reviewed the sonogram results above.  Combined with the patient's current clinical course, below are my  impressions and any appropriate recommendations for management based on  the sonographic findings:  1. Small anteflexed retroverted uterus,notable for a: irregular  endometrium with no dominant mass, Slight thickening of endometrium,  warrants endometrial sampling. 2. Stable small tubular fluid structure in right adnexa most consistent  with right hydrosalpinx.   FERGUSON,JOHN V   Endometrial biopsy report reveals simple and complex hyperplasia without atypia, needs more thorough full evaluation + therapeutic   Assessment: Patient Active Problem List   Diagnosis Date Noted  . Complex endometrial hyperplasia without atypia 02/11/2015  . Simple endometrial hyperplasia without atypia 02/11/2015  . Change in bowel habits 02/07/2015  . Thickened endometrium 01/15/2015  . PMB (postmenopausal bleeding) 11/26/2014  . Urge incontinence 11/26/2014  . Cerebral embolism with cerebral infarction 07/27/2014  . Left leg weakness 07/26/2014  . Right frontal lobe lesion 07/26/2014  . Hyperlipidemia 03/13/2013  . Sleep apnea 03/13/2013  . New onset atrial fibrillation 03/13/2013  . Near syncope 03/13/2013  . Atrial fibrillation 03/12/2013  . HYPERCHOLESTEROLEMIA 05/09/2009  . SINUSITIS 05/09/2009  . BUNION, RIGHT FOOT 05/09/2009  . SHINGLES 11/07/2008  . SUPRASPINATUS TENDINITIS 08/28/2008  . DIABETES MELLITUS, CONTROLLED 08/21/2008  . SHOULDER PAIN, LEFT 08/21/2008  . SPRAIN AND STRAIN OF STERNOCLAVICULAR 07/04/2008  . OBESITY, MORBID 02/02/2008  . PROTEINURIA 02/02/2008  . DIABETES MELLITUS, TYPE II, UNCONTROLLED 12/20/2007  . ANEMIA 12/20/2007  . Essential hypertension, benign 12/20/2007  . ASTHMA 12/20/2007  . Unspecified arthropathy, lower leg 12/20/2007    Plan: Hysteroscopy uterine curettage   EURE,LUTHER  H 02/11/2015 10:01 AM

## 2015-02-24 ENCOUNTER — Encounter (HOSPITAL_COMMUNITY): Payer: Self-pay | Admitting: *Deleted

## 2015-02-24 ENCOUNTER — Ambulatory Visit (HOSPITAL_COMMUNITY)
Admission: RE | Admit: 2015-02-24 | Discharge: 2015-02-24 | Disposition: A | Discharge status: Home / Self Care | Payer: Commercial Managed Care - HMO | Source: Ambulatory Visit | Attending: Gastroenterology | Admitting: Gastroenterology

## 2015-02-24 ENCOUNTER — Encounter (HOSPITAL_COMMUNITY)
Admission: RE | Disposition: A | Discharge status: Home / Self Care | Payer: Self-pay | Source: Ambulatory Visit | Attending: Gastroenterology

## 2015-02-24 DIAGNOSIS — K648 Other hemorrhoids: Secondary | ICD-10-CM | POA: Insufficient documentation

## 2015-02-24 DIAGNOSIS — D124 Benign neoplasm of descending colon: Secondary | ICD-10-CM

## 2015-02-24 DIAGNOSIS — K5730 Diverticulosis of large intestine without perforation or abscess without bleeding: Secondary | ICD-10-CM | POA: Diagnosis not present

## 2015-02-24 DIAGNOSIS — Z7982 Long term (current) use of aspirin: Secondary | ICD-10-CM | POA: Insufficient documentation

## 2015-02-24 DIAGNOSIS — E785 Hyperlipidemia, unspecified: Secondary | ICD-10-CM | POA: Diagnosis not present

## 2015-02-24 DIAGNOSIS — Z87891 Personal history of nicotine dependence: Secondary | ICD-10-CM | POA: Diagnosis not present

## 2015-02-24 DIAGNOSIS — J45909 Unspecified asthma, uncomplicated: Secondary | ICD-10-CM | POA: Insufficient documentation

## 2015-02-24 DIAGNOSIS — Z888 Allergy status to other drugs, medicaments and biological substances status: Secondary | ICD-10-CM | POA: Diagnosis not present

## 2015-02-24 DIAGNOSIS — D125 Benign neoplasm of sigmoid colon: Secondary | ICD-10-CM | POA: Diagnosis not present

## 2015-02-24 DIAGNOSIS — E119 Type 2 diabetes mellitus without complications: Secondary | ICD-10-CM | POA: Insufficient documentation

## 2015-02-24 DIAGNOSIS — I10 Essential (primary) hypertension: Secondary | ICD-10-CM | POA: Insufficient documentation

## 2015-02-24 DIAGNOSIS — R194 Change in bowel habit: Secondary | ICD-10-CM | POA: Diagnosis not present

## 2015-02-24 DIAGNOSIS — Z8673 Personal history of transient ischemic attack (TIA), and cerebral infarction without residual deficits: Secondary | ICD-10-CM | POA: Insufficient documentation

## 2015-02-24 DIAGNOSIS — K621 Rectal polyp: Secondary | ICD-10-CM | POA: Diagnosis not present

## 2015-02-24 DIAGNOSIS — E669 Obesity, unspecified: Secondary | ICD-10-CM | POA: Insufficient documentation

## 2015-02-24 DIAGNOSIS — D649 Anemia, unspecified: Secondary | ICD-10-CM | POA: Insufficient documentation

## 2015-02-24 HISTORY — PX: COLONOSCOPY: SHX5424

## 2015-02-24 LAB — GLUCOSE, CAPILLARY: Glucose-Capillary: 89 mg/dL (ref 70–99)

## 2015-02-24 SURGERY — COLONOSCOPY
Anesthesia: Moderate Sedation

## 2015-02-24 MED ORDER — LINACLOTIDE 145 MCG PO CAPS: ORAL_CAPSULE | ORAL | Status: DC

## 2015-02-24 MED ORDER — STERILE WATER FOR IRRIGATION IR SOLN
Status: DC | PRN
  Administered 2015-02-24: 11:00:00

## 2015-02-24 MED ORDER — MEPERIDINE HCL 100 MG/ML IJ SOLN
INTRAMUSCULAR | Status: DC | PRN
  Administered 2015-02-24 (×2): 25 mg

## 2015-02-24 MED ORDER — MEPERIDINE HCL 100 MG/ML IJ SOLN
INTRAMUSCULAR | Status: AC
  Filled 2015-02-24: qty 2

## 2015-02-24 MED ORDER — MIDAZOLAM HCL 5 MG/5ML IJ SOLN
INTRAMUSCULAR | Status: DC | PRN
  Administered 2015-02-24 (×2): 2 mg via INTRAVENOUS

## 2015-02-24 MED ORDER — SODIUM CHLORIDE 0.9 % IV SOLN
INTRAVENOUS | Status: DC
  Administered 2015-02-24: 10:00:00 via INTRAVENOUS

## 2015-02-24 MED ORDER — MIDAZOLAM HCL 5 MG/5ML IJ SOLN
INTRAMUSCULAR | Status: AC
  Filled 2015-02-24: qty 10

## 2015-02-24 NOTE — H&P (Signed)
Primary Care Physician:  Rosita Fire, MD Primary Gastroenterologist:  Dr. Oneida Alar  Pre-Procedure History & Physical: HPI:  [name] is a 74 y.o. female here for Change in bowel habits/  Past Medical History  Diagnosis Date  . Diabetes mellitus   . Asthma   . Chronic knee pain   . Back pain, chronic   . Hypertension   . Vertigo   . Hyperlipidemia   . TIA (transient ischemic attack)   . Anemia   . HOH (hard of hearing)   . Arthritis   . Stroke     Mini Stroke  . Obesity   . PMB (postmenopausal bleeding) 11/26/2014  . Urge incontinence 11/26/2014  . Thickened endometrium 01/15/2015    Past Surgical History  Procedure Laterality Date  . Appendectomy    . Ovary surgery    . Cataract extraction w/phaco Left 01/16/2013    Procedure: CATARACT EXTRACTION PHACO AND INTRAOCULAR LENS PLACEMENT (IOC);  Surgeon: Elta Guadeloupe T. Gershon Crane, MD;  Location: [facility];  Service: Ophthalmology;  Laterality: Left;  CDE:14.37  . Endometrial biopsy  04/03/2013       . Hysteroscopy w/d&c N/A 04/24/2013    Procedure: SUCTION DILATATION AND CURETTAGE /HYSTEROSCOPY;  Surgeon: Jonnie Kind, MD;  Location: [facility];  Service: Gynecology;  Laterality: N/A;  . Knee arthroscopy with medial menisectomy Left 07/26/2013    Procedure: KNEE ARTHROSCOPY WITH PARTIAL MEDIAL MENISECTOMY;  Surgeon: Sanjuana Kava, MD;  Location: [facility];  Service: Orthopedics;  Laterality: Left;  . Endometrial ablation    . Colonoscopy  2009    Dr. Wilford Corner: 4 hyperplastic polyps removed. Small internal hemorrhoids. Recommended 5 year follow-up colonoscopy.    Prior to Admission medications   Medication Sig Start Date End Date Taking? Authorizing Provider  acetaminophen (TYLENOL) 325 MG tablet Take 650 mg by mouth every 6 (six) hours as needed.   Yes Historical Provider, MD  aspirin 81 MG tablet Take 81 mg by mouth daily.   Yes Historical Provider, MD  CARTIA XT 120 MG 24 hr capsule 120 mg daily.  11/25/14  Yes Historical  Provider, MD  cholecalciferol (VITAMIN D) 1000 UNITS tablet Take 1,000 Units by mouth every morning.    Yes Historical Provider, MD  naproxen sodium (ALEVE) 220 MG tablet Take 220 mg by mouth 2 (two) times daily as needed (for pain).    Yes Historical Provider, MD  polyethylene glycol-electrolytes (TRILYTE) 420 G solution Take 4,000 mLs by mouth as directed. 02/07/15  Yes Danie Binder, MD  simvastatin (ZOCOR) 20 MG tablet Take 20 mg by mouth at bedtime.    Yes Historical Provider, MD  levalbuterol [facility] HFA) 45 MCG/ACT inhaler Inhale 2 puffs into the lungs every 6 (six) hours as needed for wheezing or shortness of breath. Patient not taking: Reported on 02/13/2015 03/13/13   Rosita Fire, MD    Allergies as of 02/07/2015 - Review Complete 02/07/2015  Allergen Reaction Noted  . Imitrex [sumatriptan] Other (See Comments) 04/18/2013  . Mango flavor  04/09/2013  . Oysters [shellfish allergy]  04/09/2013    Family History  Problem Relation Age of Onset  . Arrhythmia Father     Atrial fib/Pacemaker  . Heart failure Mother   . Diabetes Mother   . Other Mother     fell and broke hip  . Diabetes Sister   . Lupus Daughter   . Mental illness Son   . ADD / ADHD Son   . Other Son  soft bones  . Diabetes Maternal Grandmother   . Stroke Paternal Grandmother   . Arthritis Paternal Grandfather   . Other Sister     MVA  . Hypertension Brother   . Diabetes Sister   . Hypertension Sister   . Colon cancer Neg Hx     History   Social History  . Marital Status: Widowed    Spouse Name: N/A  . Number of Children: 2  . Years of Education: N/A   Occupational History  . retired    Social History Main Topics  . Smoking status: Former Smoker -- 0.25 packs/day for 30 years    Types: Cigarettes  . Smokeless tobacco: Never Used  . Alcohol Use: No  . Drug Use: No  . Sexual Activity: Not Currently    Birth Control/ Protection: Post-menopausal     Comment: ablation   Other Topics  Concern  . Not on file   Social History Narrative    Review of Systems: See HPI, otherwise negative ROS   Physical Exam: BP 175/88 mmHg  Pulse 78  Temp(Src) 97.5 F (36.4 C) (Oral)  Resp 22  Ht 5' (1.524 m)  Wt 292 lb (132.45 kg)  BMI 57.03 kg/m2  SpO2 20%  LMP 03/31/2013 General:   Alert,  pleasant and cooperative in NAD Head:  Normocephalic and atraumatic. Neck:  Supple; Lungs:  Clear throughout to auscultation.    Heart:  Regular rate and rhythm. Abdomen:  Soft, nontender and nondistended. Normal bowel sounds, without guarding, and without rebound.   Neurologic:  Alert and  oriented x4;  NO  NEW FOCAL DEFICITS  Impression/Plan:   Change in bowel habits  PLAN:  1. TCS TODAY

## 2015-02-24 NOTE — Progress Notes (Signed)
REVIEWED-NO ADDITIONAL RECOMMENDATIONS. 

## 2015-02-24 NOTE — Discharge Instructions (Signed)
You had 10 polyps removed. You have MODERATE internal hemorrhoids and diverticulosis IN YOUR LEFT COLON.   FOLLOW A HIGH FIBER DIET. AVOID ITEMS THAT CAUSE BLOATING & GAS. SEE INFO BELOW.  DRINK WATER TO KEEP YOUR URINE LIGHT YELLOW.  ADD LINZESS 30 MINS PRIOR TO BREAKFAST. IT MAY CAUSE EXPLOSIVE DIARRHEA.  YOUR BIOPSY RESULTS WILL BE AVAILABLE IN MY CHART AFTER MAR 23  OR MY OFFICE WILL CONTACT YOU IN 10-14 DAYS WITH YOUR RESULTS.   FOLLOW UP IN 4 MOS.   Next colonoscopy in 3-5 years.  Colonoscopy Care After Read the instructions outlined below and refer to this sheet in the next week. These discharge instructions provide you with general information on caring for yourself after you leave the hospital. While your treatment has been planned according to the most current medical practices available, unavoidable complications occasionally occur. If you have any problems or questions after discharge, call DR. Ibrahim Mcpheeters, [phone].  ACTIVITY  You may resume your regular activity, but move at a slower pace for the next 24 hours.   Take frequent rest periods for the next 24 hours.   Walking will help get rid of the air and reduce the bloated feeling in your belly (abdomen).   No driving for 24 hours (because of the medicine (anesthesia) used during the test).   You may shower.   Do not sign any important legal documents or operate any machinery for 24 hours (because of the anesthesia used during the test).    NUTRITION  Drink plenty of fluids.   You may resume your normal diet as instructed by your doctor.   Begin with a light meal and progress to your normal diet. Heavy or fried foods are harder to digest and may make you feel sick to your stomach (nauseated).   Avoid alcoholic beverages for 24 hours or as instructed.    MEDICATIONS  You may resume your normal medications.   WHAT YOU CAN EXPECT TODAY  Some feelings of bloating in the abdomen.   Passage of more gas  than usual.   Spotting of blood in your stool or on the toilet paper  .  IF YOU HAD POLYPS REMOVED DURING THE COLONOSCOPY:  Eat a soft diet IF YOU HAVE NAUSEA, BLOATING, ABDOMINAL PAIN, OR VOMITING.    FINDING OUT THE RESULTS OF YOUR TEST Not all test results are available during your visit. DR. Oneida Alar WILL CALL YOU WITHIN 14 DAYS OF YOUR PROCEDUE WITH YOUR RESULTS. Do not assume everything is normal if you have not heard from DR. Idus Rathke, CALL HER OFFICE AT [phone].  SEEK IMMEDIATE MEDICAL ATTENTION AND CALL THE OFFICE: [phone] IF:  You have more than a spotting of blood in your stool.   Your belly is swollen (abdominal distention).   You are nauseated or vomiting.   You have a temperature over 101F.   You have abdominal pain or discomfort that is severe or gets worse throughout the day.  Polyps, Colon  A polyp is extra tissue that grows inside your body. Colon polyps grow in the large intestine. The large intestine, also called the colon, is part of your digestive system. It is a long, hollow tube at the end of your digestive tract where your body makes and stores stool. Most polyps are not dangerous. They are benign. This means they are not cancerous. But over time, some types of polyps can turn into cancer. Polyps that are smaller than a pea are usually not harmful. But larger  polyps could someday become or may already be cancerous. To be safe, doctors remove all polyps and test them.   WHO GETS POLYPS? Anyone can get polyps, but certain people are more likely than others. You may have a greater chance of getting polyps if:  You are over 50.   You have had polyps before.   Someone in your family has had polyps.   Someone in your family has had cancer of the large intestine.   Find out if someone in your family has had polyps. You may also be more likely to get polyps if you:   Eat a lot of fatty foods   Smoke   Drink alcohol   Do not exercise  Eat too much     TREATMENT  The caregiver will remove the polyp during sigmoidoscopy or colonoscopy.  PREVENTION There is not one sure way to prevent polyps. You might be able to lower your risk of getting them if you:  Eat more fruits and vegetables and less fatty food.   Do not smoke.   Avoid alcohol.   Exercise every day.   Lose weight if you are overweight.   Eating more calcium and folate can also lower your risk of getting polyps. Some foods that are rich in calcium are milk, cheese, and broccoli. Some foods that are rich in folate are chickpeas, kidney beans, and spinach.   High-Fiber Diet A high-fiber diet changes your normal diet to include more whole grains, legumes, fruits, and vegetables. Changes in the diet involve replacing refined carbohydrates with unrefined foods. The calorie level of the diet is essentially unchanged. The Dietary Reference Intake (recommended amount) for adult males is 38 grams per day. For adult females, it is 25 grams per day. Pregnant and lactating women should consume 28 grams of fiber per day. Fiber is the intact part of a plant that is not broken down during digestion. Functional fiber is fiber that has been isolated from the plant to provide a beneficial effect in the body. PURPOSE  Increase stool bulk.   Ease and regulate bowel movements.   Lower cholesterol.  INDICATIONS THAT YOU NEED MORE FIBER  Constipation and hemorrhoids.   Uncomplicated diverticulosis (intestine condition) and irritable bowel syndrome.   Weight management.   As a protective measure against hardening of the arteries (atherosclerosis), diabetes, and cancer.   GUIDELINES FOR INCREASING FIBER IN THE DIET  Start adding fiber to the diet slowly. A gradual increase of about 5 more grams (2 slices of whole-wheat bread, 2 servings of most fruits or vegetables, or 1 bowl of high-fiber cereal) per day is best. Too rapid an increase in fiber may result in constipation, flatulence, and  bloating.   Drink enough water and fluids to keep your urine clear or pale yellow. Water, juice, or caffeine-free drinks are recommended. Not drinking enough fluid may cause constipation.   Eat a variety of high-fiber foods rather than one type of fiber.   Try to increase your intake of fiber through using high-fiber foods rather than fiber pills or supplements that contain small amounts of fiber.   The goal is to change the types of food eaten. Do not supplement your present diet with high-fiber foods, but replace foods in your present diet.  INCLUDE A VARIETY OF FIBER SOURCES  Replace refined and processed grains with whole grains, canned fruits with fresh fruits, and incorporate other fiber sources. White rice, white breads, and most bakery goods contain little or no fiber.  Brown whole-grain rice, buckwheat oats, and many fruits and vegetables are all good sources of fiber. These include: broccoli, Brussels sprouts, cabbage, cauliflower, beets, sweet potatoes, white potatoes (skin on), carrots, tomatoes, eggplant, squash, berries, fresh fruits, and dried fruits.   Cereals appear to be the richest source of fiber. Cereal fiber is found in whole grains and bran. Bran is the fiber-rich outer coat of cereal grain, which is largely removed in refining. In whole-grain cereals, the bran remains. In breakfast cereals, the largest amount of fiber is found in those with "bran" in their names. The fiber content is sometimes indicated on the label.   You may need to include additional fruits and vegetables each day.   In baking, for 1 cup white flour, you may use the following substitutions:   1 cup whole-wheat flour minus 2 tablespoons.   1/2 cup white flour plus 1/2 cup whole-wheat flour.   Diverticulosis Diverticulosis is a common condition that develops when small pouches (diverticula) form in the wall of the colon. The risk of diverticulosis increases with age. It happens more often in people  who eat a low-fiber diet. Most individuals with diverticulosis have no symptoms. Those individuals with symptoms usually experience belly (abdominal) pain, constipation, or loose stools (diarrhea).  HOME CARE INSTRUCTIONS  Increase the amount of fiber in your diet as directed by your caregiver or dietician. This may reduce symptoms of diverticulosis.   Drink at least 6 to 8 glasses of water each day to prevent constipation.   Try not to strain when you have a bowel movement.   Avoiding nuts and seeds to prevent complications is still an uncertain benefit.       FOODS HAVING HIGH FIBER CONTENT INCLUDE:  Fruits. Apple, peach, pear, tangerine, raisins, prunes.   Vegetables. Brussels sprouts, asparagus, broccoli, cabbage, carrot, cauliflower, romaine lettuce, spinach, summer squash, tomato, winter squash, zucchini.   Starchy Vegetables. Baked beans, kidney beans, lima beans, split peas, lentils, potatoes (with skin).   Grains. Whole wheat bread, brown rice, bran flake cereal, plain oatmeal, white rice, shredded wheat, bran muffins.    SEEK IMMEDIATE MEDICAL CARE IF:  You develop increasing pain or severe bloating.   You have an oral temperature above 101F.   You develop vomiting or bowel movements that are bloody or black.   Hemorrhoids Hemorrhoids are dilated (enlarged) veins around the rectum. Sometimes clots will form in the veins. This makes them swollen and painful. These are called thrombosed hemorrhoids. Causes of hemorrhoids include:  Constipation.   Straining to have a bowel movement.   HEAVY LIFTING HOME CARE INSTRUCTIONS  Eat a well balanced diet and drink 6 to 8 glasses of water every day to avoid constipation. You may also use a bulk laxative.   Avoid straining to have bowel movements.   Keep anal area dry and clean.   Do not use a donut shaped pillow or sit on the toilet for long periods. This increases blood pooling and pain.   Move your bowels when  your body has the urge; this will require less straining and will decrease pain and pressure.

## 2015-02-24 NOTE — Op Note (Signed)
[facility] [address] [place], [address]   COLONOSCOPY PROCEDURE REPORT  PATIENT: [name], [name]  MR#: [id number] BIRTHDATE: 1941-03-27 , 73  yrs. old GENDER: female ENDOSCOPIST: Danie Binder, MD REFERRED GB:[id number] Fanta, M.D. PROCEDURE DATE:  2015-03-24 PROCEDURE:   Colonoscopy with cold biopsy polypectomy and Colonoscopy with snare polypectomy INDICATIONS:change in bowel habits. MEDICATIONS: Demerol 50 mg IV and Versed 4 mg IV  DESCRIPTION OF PROCEDURE:    Physical exam was performed.  Informed consent was obtained from the patient after explaining the benefits, risks, and alternatives to procedure.  The patient was connected to monitor and placed in left lateral position. Continuous oxygen was provided by nasal cannula and IV medicine administered through an indwelling cannula.  After administration of sedation and rectal exam, the patients rectum was intubated and the EC-[id number] ([id number])  colonoscope was advanced under direct visualization to the ileum.  The scope was removed slowly by carefully examining the color, texture, anatomy, and integrity mucosa on the way out.  The patient was recovered in endoscopy and discharged home in satisfactory condition.    COLON FINDINGS: The examined terminal ileum appeared to be normal. , Four sessile polyps ranging from 2 to 77mm in size were found in the sigmoid colon and descending colon.  A polypectomy was performed with a cold snare.  A polypectomy was performed with cold forceps.  , Six sessile polyps ranging from 6 to 24mm in size were found.  A polypectomy was performed using snare cautery.  , There was mild diverticulosis noted in the sigmoid colon with associated muscular hypertrophy and tortuosity.  , and Moderate sized internal hemorrhoids were found.  PREP QUALITY: good.CECAL W/D TIME: 34       minutes   COMPLICATIONS: None  ENDOSCOPIC IMPRESSION: 1.   10 COLON POLYPS REMOVED 2.  Mild  diverticulosis in the sigmoid colon 3.   Moderate sized internal hemorrhoids  RECOMMENDATIONS: FOLLOW A HIGH FIBER DIET.  AVOID ITEMS THAT CAUSE BLOATING & GAS. DRINK WATER TO KEEP YOUR URINE LIGHT YELLOW. ADD LINZESS 30 MINS PRIOR TO BREAKFAST.  IT MAY CAUSE EXPLOSIVE DIARRHEA. AWAIT BIOPSY. FOLLOW UP IN 4 MOS. Next colonoscopy in 3-5 years.   _______________________________ eSignedDanie Binder, MD 24-Mar-2015 12:36 PM   CPT CODES: ICD CODES:  The ICD and CPT codes recommended by this software are interpretations from the data that the clinical staff has captured with the software.  The verification of the translation of this report to the ICD and CPT codes and modifiers is the sole responsibility of the health care institution and practicing physician where this report was generated.  [employer] will not be held responsible for the validity of the ICD and CPT codes included on this report.  AMA assumes no liability for data contained or not contained herein. C[employer] is a Designer, television/film set of the [employer].

## 2015-02-26 ENCOUNTER — Encounter (HOSPITAL_COMMUNITY): Payer: Self-pay | Admitting: Gastroenterology

## 2015-03-06 NOTE — Patient Instructions (Signed)
[name]  03/06/2015   Your procedure is scheduled on:   03/12/2015  Report to [facility] at  19  AM.  Call this number if you have problems the morning of surgery: [phone]   Remember:   Do not eat food or drink liquids after midnight.   Take these medicines the morning of surgery with A SIP OF WATER:  cartia   Do not wear jewelry, make-up or nail polish.  Do not wear lotions, powders, or perfumes.  Do not shave 48 hours prior to surgery. Men may shave face and neck.  Do not bring valuables to the hospital.  [facility] is not responsible for any belongings or valuables.               Contacts, dentures or bridgework may not be worn into surgery.  Leave suitcase in the car. After surgery it may be brought to your room.  For patients admitted to the hospital, discharge time is determined by your treatment team.               Patients discharged the day of surgery will not be allowed to drive home.  Name and phone number of your driver: family  Special Instructions: Shower using CHG 2 nights before surgery and the night before surgery.  If you shower the day of surgery use CHG.  Use special wash - you have one bottle of CHG for all showers.  You should use approximately 1/3 of the bottle for each shower.   Please read over the following fact sheets that you were given: Pain Booklet, Coughing and Deep Breathing, Surgical Site Infection Prevention, Anesthesia Post-op Instructions and Care and Recovery After Surgery Endocervical Curettage Endocervical curettage is a procedure to obtain a tissue sample from your endocervical canal to look for abnormal cells. This procedure is sometimes done as part of an exam called a colposcopy, in which your health care provider takes a close look at the surface of your cervix because of an abnormal Pap test result or the presence of genital warts, bleeding, or pain. LET [facility] CARE PROVIDER KNOW ABOUT:  Recent vaginal infections  you have had.  Recent menstrual periods or bleeding you have had.  Any allergies you have.  All medicines you are taking, including vitamins, herbs, eye drops, creams, and over-the-counter medicines.  Previous problems you or members of your family have had with the use of anesthetics.  Any blood disorders you have.  Previous surgeries you have had.  Medical conditions you have. RISKS AND COMPLICATIONS  Generally, endocervical curettage is a safe procedure. However, as with any procedure, complications can occur. Possible complications include:  Excessive bleeding.  Infection.  Injury to surrounding organs.  Allergic reaction to anesthetics or medicines. BEFORE THE PROCEDURE  Do not take aspirin or blood thinners for a week before the procedure. These can cause bleeding.  Do not douche or use tampons for at least 3 days before the procedure.  Do not have sexual intercourse for at least 3 days before the procedure.  Arrange for someone to take you home after the procedure. PROCEDURE   For this procedure, you will be asked to undress from the waist down. You will need to lie on an exam table with your feet in stirrups. Your legs and belly will be covered with a sheet.  A warm metal or plastic instrument (speculum) will be placed in your vagina to keep it open  and to allow your health care provider to see inside your vagina.  A medicine that numbs the area (local anesthetic) may be used.  A sharp curved instrument will be used to scrape cells from your cervix or endocervix.  Medicines may be put on the surface of the tissue to stop any bleeding.  The scraped tissue sample is sent to the lab to see if there are any abnormalities. AFTER THE PROCEDURE   You may have some mild cramping pain.  You will rest in the office or clinic until you are stable and feel ready to go home. Document Released: 08/31/2008 Document Revised: 07/25/2013 Document Reviewed:  06/21/2013 [facility] Patient Information 2015 [place], [place]. This information is not intended to replace advice given to you by your health care provider. Make sure you discuss any questions you have with your health care provider. Hysteroscopy Hysteroscopy is a procedure used for looking inside the womb (uterus). It may be done for various reasons, including:  To evaluate abnormal bleeding, fibroid (benign, noncancerous) tumors, polyps, scar tissue (adhesions), and possibly cancer of the uterus.  To look for lumps (tumors) and other uterine growths.  To look for causes of why a woman cannot get pregnant (infertility), causes of recurrent loss of pregnancy (miscarriages), or a lost intrauterine device (IUD).  To perform a sterilization by blocking the fallopian tubes from inside the uterus. In this procedure, a thin, flexible tube with a tiny light and camera on the end of it (hysteroscope) is used to look inside the uterus. A hysteroscopy should be done right after a menstrual period to be sure you are not pregnant. LET [facility] CARE PROVIDER KNOW ABOUT:   Any allergies you have.  All medicines you are taking, including vitamins, herbs, eye drops, creams, and over-the-counter medicines.  Previous problems you or members of your family have had with the use of anesthetics.  Any blood disorders you have.  Previous surgeries you have had.  Medical conditions you have. RISKS AND COMPLICATIONS  Generally, this is a safe procedure. However, as with any procedure, complications can occur. Possible complications include:  Putting a hole in the uterus.  Excessive bleeding.  Infection.  Damage to the cervix.  Injury to other organs.  Allergic reaction to medicines.  Too much fluid used in the uterus for the procedure. BEFORE THE PROCEDURE   Ask your health care provider about changing or stopping any regular medicines.  Do not take aspirin or blood thinners for 1 week before  the procedure, or as directed by your health care provider. These can cause bleeding.  If you smoke, do not smoke for 2 weeks before the procedure.  In some cases, a medicine is placed in the cervix the day before the procedure. This medicine makes the cervix have a larger opening (dilate). This makes it easier for the instrument to be inserted into the uterus during the procedure.  Do not eat or drink anything for at least 8 hours before the surgery.  Arrange for someone to take you home after the procedure. PROCEDURE   You may be given a medicine to relax you (sedative). You may also be given one of the following:  A medicine that numbs the area around the cervix (local anesthetic).  A medicine that makes you sleep through the procedure (general anesthetic).  The hysteroscope is inserted through the vagina into the uterus. The camera on the hysteroscope sends a picture to a TV screen. This gives the surgeon a good  view inside the uterus.  During the procedure, air or a liquid is put into the uterus, which allows the surgeon to see better.  Sometimes, tissue is gently scraped from inside the uterus. These tissue samples are sent to a lab for testing. AFTER THE PROCEDURE   If you had a general anesthetic, you may be groggy for a couple hours after the procedure.  If you had a local anesthetic, you will be able to go home as soon as you are stable and feel ready.  You may have some cramping. This normally lasts for a couple days.  You may have bleeding, which varies from light spotting for a few days to menstrual-like bleeding for 3-7 days. This is normal.  If your test results are not back during the visit, make an appointment with your health care provider to find out the results. Document Released: 02/28/2001 Document Revised: 09/12/2013 Document Reviewed: 06/21/2013 [facility] Patient Information 2015 [place], [place]. This information is not intended to replace advice given to you  by your health care provider. Make sure you discuss any questions you have with your health care provider. PATIENT INSTRUCTIONS POST-ANESTHESIA  IMMEDIATELY FOLLOWING SURGERY:  Do not drive or operate machinery for the first twenty four hours after surgery.  Do not make any important decisions for twenty four hours after surgery or while taking narcotic pain medications or sedatives.  If you develop intractable nausea and vomiting or a severe headache please notify your doctor immediately.  FOLLOW-UP:  Please make an appointment with your surgeon as instructed. You do not need to follow up with anesthesia unless specifically instructed to do so.  WOUND CARE INSTRUCTIONS (if applicable):  Keep a dry clean dressing on the anesthesia/puncture wound site if there is drainage.  Once the wound has quit draining you may leave it open to air.  Generally you should leave the bandage intact for twenty four hours unless there is drainage.  If the epidural site drains for more than 36-48 hours please call the anesthesia department.  QUESTIONS?:  Please feel free to call your physician or the hospital operator if you have any questions, and they will be happy to assist you.

## 2015-03-07 ENCOUNTER — Encounter (HOSPITAL_COMMUNITY)
Admission: RE | Admit: 2015-03-07 | Discharge: 2015-03-07 | Disposition: A | Discharge status: Home / Self Care | Payer: Commercial Managed Care - HMO | Source: Ambulatory Visit | Attending: Obstetrics & Gynecology | Admitting: Obstetrics & Gynecology

## 2015-03-07 NOTE — Pre-Procedure Instructions (Signed)
Patient forgot about PAT and will reschedule for Monday.

## 2015-03-10 ENCOUNTER — Encounter (HOSPITAL_COMMUNITY): Payer: Self-pay

## 2015-03-10 ENCOUNTER — Encounter (HOSPITAL_COMMUNITY)
Admission: RE | Admit: 2015-03-10 | Discharge: 2015-03-10 | Disposition: A | Discharge status: Home / Self Care | Payer: Commercial Managed Care - HMO | Source: Ambulatory Visit | Attending: Obstetrics & Gynecology | Admitting: Obstetrics & Gynecology

## 2015-03-10 ENCOUNTER — Other Ambulatory Visit: Payer: Self-pay

## 2015-03-10 DIAGNOSIS — Z01810 Encounter for preprocedural cardiovascular examination: Secondary | ICD-10-CM | POA: Insufficient documentation

## 2015-03-10 DIAGNOSIS — Z01812 Encounter for preprocedural laboratory examination: Secondary | ICD-10-CM | POA: Diagnosis not present

## 2015-03-10 HISTORY — DX: Cardiac arrhythmia, unspecified: I49.9

## 2015-03-10 LAB — COMPREHENSIVE METABOLIC PANEL
ALT: 14 U/L (ref 0–35)
AST: 17 U/L (ref 0–37)
Albumin: 3.4 g/dL — ABNORMAL LOW (ref 3.5–5.2)
Alkaline Phosphatase: 58 U/L (ref 39–117)
Anion gap: 8 (ref 5–15)
BUN: 24 mg/dL — ABNORMAL HIGH (ref 6–23)
CO2: 26 mmol/L (ref 19–32)
Calcium: 8.8 mg/dL (ref 8.4–10.5)
Chloride: 106 mmol/L (ref 96–112)
Creatinine, Ser: 0.8 mg/dL (ref 0.50–1.10)
GFR calc Af Amer: 84 mL/min — ABNORMAL LOW (ref >90)
GFR calc non Af Amer: 72 mL/min — ABNORMAL LOW (ref >90)
Glucose, Bld: 95 mg/dL (ref 70–99)
Potassium: 4 mmol/L (ref 3.5–5.1)
Sodium: 140 mmol/L (ref 135–145)
Total Bilirubin: 0.6 mg/dL (ref 0.3–1.2)
Total Protein: 6.7 g/dL (ref 6.0–8.3)

## 2015-03-10 LAB — CBC
HCT: 40.6 % (ref 36.0–46.0)
Hemoglobin: 13.6 g/dL (ref 12.0–15.0)
MCH: 33.3 pg (ref 26.0–34.0)
MCHC: 33.5 g/dL (ref 30.0–36.0)
MCV: 99.5 fL (ref 78.0–100.0)
Platelets: 192 10*3/uL (ref 150–400)
RBC: 4.08 MIL/uL (ref 3.87–5.11)
RDW: 12.8 % (ref 11.5–15.5)
WBC: 4.4 10*3/uL (ref 4.0–10.5)

## 2015-03-12 ENCOUNTER — Telehealth: Payer: Self-pay | Admitting: Obstetrics & Gynecology

## 2015-03-12 ENCOUNTER — Ambulatory Visit (HOSPITAL_COMMUNITY)
Admission: RE | Admit: 2015-03-12 | Discharge: 2015-03-12 | Disposition: A | Discharge status: Home / Self Care | Payer: Commercial Managed Care - HMO | Source: Ambulatory Visit | Attending: Obstetrics & Gynecology | Admitting: Obstetrics & Gynecology

## 2015-03-12 ENCOUNTER — Encounter (HOSPITAL_COMMUNITY): Payer: Self-pay | Admitting: *Deleted

## 2015-03-12 ENCOUNTER — Encounter (HOSPITAL_COMMUNITY)
Admission: RE | Disposition: A | Discharge status: Home / Self Care | Payer: Self-pay | Source: Ambulatory Visit | Attending: Obstetrics & Gynecology

## 2015-03-12 SURGERY — DILATATION AND CURETTAGE /HYSTEROSCOPY
Anesthesia: General

## 2015-03-12 MED ORDER — CEFAZOLIN SODIUM 1-5 GM-% IV SOLN: 1.0000 g | INTRAVENOUS | Status: DC

## 2015-03-12 MED ORDER — VALACYCLOVIR HCL 1 G PO TABS: 1000.0000 mg | ORAL_TABLET | Freq: Three times a day (TID) | ORAL | Status: DC

## 2015-03-12 MED ORDER — KETOROLAC TROMETHAMINE 30 MG/ML IJ SOLN: 30.0000 mg | Freq: Once | INTRAMUSCULAR | Status: DC

## 2015-03-12 MED ORDER — DEXTROSE 5 % IV SOLN: 3.0000 g | INTRAVENOUS | Status: DC

## 2015-03-12 MED ORDER — CEFAZOLIN SODIUM-DEXTROSE 2-3 GM-% IV SOLR: 2.0000 g | INTRAVENOUS | Status: DC

## 2015-03-12 SURGICAL SUPPLY — 24 items
BAG HAMPER (MISCELLANEOUS) ×3 IMPLANT
CLOTH BEACON ORANGE TIMEOUT ST (SAFETY) ×3 IMPLANT
COVER LIGHT HANDLE STERIS (MISCELLANEOUS) ×6 IMPLANT
FORMALIN 10 PREFIL 120ML (MISCELLANEOUS) ×3 IMPLANT
GAUZE SPONGE 4X4 16PLY XRAY LF (GAUZE/BANDAGES/DRESSINGS) ×3 IMPLANT
GLOVE BIOGEL PI IND STRL 8 (GLOVE) ×1 IMPLANT
GLOVE BIOGEL PI INDICATOR 8 (GLOVE) ×2
GLOVE ECLIPSE 8.0 STRL XLNG CF (GLOVE) ×3 IMPLANT
GOWN STRL REUS W/TWL LRG LVL3 (GOWN DISPOSABLE) ×6 IMPLANT
GOWN STRL REUS W/TWL XL LVL3 (GOWN DISPOSABLE) ×3 IMPLANT
INST SET HYSTEROSCOPY (KITS) ×3 IMPLANT
IV NS 1000ML (IV SOLUTION) ×2
IV NS 1000ML BAXH (IV SOLUTION) ×1 IMPLANT
KIT ROOM TURNOVER AP CYSTO (KITS) ×3 IMPLANT
MANIFOLD NEPTUNE II (INSTRUMENTS) ×3 IMPLANT
NS IRRIG 1000ML POUR BTL (IV SOLUTION) ×3 IMPLANT
PACK BASIC III (CUSTOM PROCEDURE TRAY) ×2
PACK SRG BSC III STRL LF ECLPS (CUSTOM PROCEDURE TRAY) ×1 IMPLANT
PAD ARMBOARD 7.5X6 YLW CONV (MISCELLANEOUS) ×3 IMPLANT
PAD TELFA 3X4 1S STER (GAUZE/BANDAGES/DRESSINGS) ×3 IMPLANT
SET IRRIG Y TYPE TUR BLADDER L (SET/KITS/TRAYS/PACK) ×3 IMPLANT
SHEET LAVH (DRAPES) ×3 IMPLANT
TOWEL OR 17X26 4PK STRL BLUE (TOWEL DISPOSABLE) ×3 IMPLANT
YANKAUER SUCT BULB TIP 10FT TU (MISCELLANEOUS) ×3 IMPLANT

## 2015-03-12 NOTE — Progress Notes (Signed)
Pt arrived for surgery. C/o pain left lower arm . Site has rash. Pt states it is the same look and feel of shingles that she has had previously. Dr Patsey Berthold notified.

## 2015-03-12 NOTE — Progress Notes (Signed)
Dr Charlott Holler in to see pt. Cancelled surgery. Dr Elonda Husky notified.

## 2015-03-14 ENCOUNTER — Telehealth: Payer: Self-pay | Admitting: Gastroenterology

## 2015-03-14 NOTE — Telephone Encounter (Signed)
Reminder in epic °

## 2015-03-14 NOTE — Telephone Encounter (Signed)
Please call pt. She had simple adenomas removed.   FOLLOW A HIGH FIBER DIET. AVOID ITEMS THAT CAUSE BLOATING & GAS.   DRINK WATER TO KEEP YOUR URINE LIGHT YELLOW.  CONTINUE LINZESS 30 MINS PRIOR TO BREAKFAST. IT MAY CAUSE EXPLOSIVE DIARRHEA.  FOLLOW UP IN 4 MOS E 15 CONSTIPATION.   Next colonoscopy in 3 years.

## 2015-03-17 ENCOUNTER — Telehealth: Payer: Self-pay | Admitting: Obstetrics & Gynecology

## 2015-03-17 NOTE — Telephone Encounter (Signed)
Pt is aware of results. 

## 2015-03-17 NOTE — Telephone Encounter (Signed)
Pt DX with shingles requesting medication for itching. Pt states the [facility] helps some but doesn't last long.

## 2015-03-17 NOTE — Telephone Encounter (Signed)
Routing to Doris 

## 2015-03-18 ENCOUNTER — Telehealth: Payer: Self-pay | Admitting: Obstetrics & Gynecology

## 2015-03-18 MED ORDER — HYDROXYZINE PAMOATE 25 MG PO CAPS: 100.0000 mg | ORAL_CAPSULE | Freq: Three times a day (TID) | ORAL | Status: DC | PRN

## 2015-03-18 NOTE — Telephone Encounter (Signed)
Pt informed Rx sent to [place], [place].

## 2015-03-19 ENCOUNTER — Encounter: Payer: Commercial Managed Care - HMO | Admitting: Obstetrics & Gynecology

## 2015-03-27 ENCOUNTER — Telehealth: Payer: Self-pay | Admitting: Obstetrics & Gynecology

## 2015-03-27 ENCOUNTER — Other Ambulatory Visit: Payer: Self-pay | Admitting: Obstetrics & Gynecology

## 2015-03-28 ENCOUNTER — Encounter (HOSPITAL_COMMUNITY): Admission: RE | Admit: 2015-03-28 | Payer: Commercial Managed Care - HMO | Source: Ambulatory Visit

## 2015-03-28 ENCOUNTER — Encounter (HOSPITAL_COMMUNITY)
Admission: RE | Admit: 2015-03-28 | Discharge: 2015-03-28 | Disposition: A | Discharge status: Home / Self Care | Payer: Commercial Managed Care - HMO | Source: Ambulatory Visit | Attending: Obstetrics & Gynecology | Admitting: Obstetrics & Gynecology

## 2015-03-28 ENCOUNTER — Encounter (HOSPITAL_COMMUNITY): Payer: Self-pay

## 2015-03-28 DIAGNOSIS — Z01818 Encounter for other preprocedural examination: Secondary | ICD-10-CM | POA: Diagnosis not present

## 2015-03-28 DIAGNOSIS — N8501 Benign endometrial hyperplasia: Secondary | ICD-10-CM | POA: Insufficient documentation

## 2015-03-28 LAB — URINE MICROSCOPIC-ADD ON

## 2015-03-28 LAB — COMPREHENSIVE METABOLIC PANEL
ALT: 13 U/L (ref 0–35)
AST: 16 U/L (ref 0–37)
Albumin: 3.4 g/dL — ABNORMAL LOW (ref 3.5–5.2)
Alkaline Phosphatase: 56 U/L (ref 39–117)
Anion gap: 7 (ref 5–15)
BUN: 30 mg/dL — ABNORMAL HIGH (ref 6–23)
CO2: 28 mmol/L (ref 19–32)
Calcium: 8.9 mg/dL (ref 8.4–10.5)
Chloride: 108 mmol/L (ref 96–112)
Creatinine, Ser: 1.1 mg/dL (ref 0.50–1.10)
GFR calc Af Amer: 57 mL/min — ABNORMAL LOW (ref >90)
GFR calc non Af Amer: 49 mL/min — ABNORMAL LOW (ref >90)
Glucose, Bld: 88 mg/dL (ref 70–99)
Potassium: 4.4 mmol/L (ref 3.5–5.1)
Sodium: 143 mmol/L (ref 135–145)
Total Bilirubin: 0.7 mg/dL (ref 0.3–1.2)
Total Protein: 6.6 g/dL (ref 6.0–8.3)

## 2015-03-28 LAB — URINALYSIS, ROUTINE W REFLEX MICROSCOPIC
Bilirubin Urine: NEGATIVE
Glucose, UA: NEGATIVE mg/dL
Ketones, ur: NEGATIVE mg/dL
Leukocytes, UA: NEGATIVE
Nitrite: NEGATIVE
Specific Gravity, Urine: >1.03 — ABNORMAL HIGH (ref 1.005–1.030)
Urobilinogen, UA: 0.2 mg/dL (ref 0.0–1.0)
pH: 5.5 (ref 5.0–8.0)

## 2015-03-28 LAB — CBC
HCT: 36.3 % (ref 36.0–46.0)
Hemoglobin: 12.2 g/dL (ref 12.0–15.0)
MCH: 33.4 pg (ref 26.0–34.0)
MCHC: 33.6 g/dL (ref 30.0–36.0)
MCV: 99.5 fL (ref 78.0–100.0)
Platelets: 172 10*3/uL (ref 150–400)
RBC: 3.65 MIL/uL — ABNORMAL LOW (ref 3.87–5.11)
RDW: 13.7 % (ref 11.5–15.5)
WBC: 3.5 10*3/uL — ABNORMAL LOW (ref 4.0–10.5)

## 2015-03-28 NOTE — Patient Instructions (Signed)
[name]  03/28/2015   Your procedure is scheduled on:  Wednesday, 04/02/15  Report to [facility] at 0700 AM.  Call this number if you have problems the morning of surgery: [phone]   Remember:   Do not eat food or drink liquids after midnight.   Take these medicines the morning of surgery with A SIP OF WATER: vistaril and cartia XT. Do not take your medicine to control blood sugar.   Do not wear jewelry, make-up or nail polish.  Do not wear lotions, powders, or perfumes. You may wear deodorant.  Do not shave 48 hours prior to surgery. Men may shave face and neck.  Do not bring valuables to the hospital.  [facility] is not responsible                  for any belongings or valuables.               Contacts, dentures or bridgework may not be worn into surgery.  Leave suitcase in the car. After surgery it may be brought to your room.  For patients admitted to the hospital, discharge time is determined by your                treatment team.               Patients discharged the day of surgery will not be allowed to drive  home.  Name and phone number of your driver: family  Special Instructions: Shower using CHG 2 nights before surgery and the night before surgery.  If you shower the day of surgery use CHG.  Use special wash - you have one bottle of CHG for all showers.  You should use approximately 1/3 of the bottle for each shower.   Please read over the following fact sheets that you were given: Anesthesia Post-op Instructions and Care and Recovery After Surgery  Hysteroscopy, Care After Refer to this sheet in the next few weeks. These instructions provide you with information on caring for yourself after your procedure. Your health care provider may also give you more specific instructions. Your treatment has been planned according to current medical practices, but problems sometimes occur. Call your health care provider if you have any problems or questions after your  procedure.  WHAT TO EXPECT AFTER THE PROCEDURE After your procedure, it is typical to have the following:  You may have some cramping. This normally lasts for a couple days.  You may have bleeding. This can vary from light spotting for a few days to menstrual-like bleeding for 3-7 days. HOME CARE INSTRUCTIONS  Rest for the first 1-2 days after the procedure.  Only take over-the-counter or prescription medicines as directed by your health care provider. Do not take aspirin. It can increase the chances of bleeding.  Take showers instead of baths for 2 weeks or as directed by your health care provider.  Do not drive for 24 hours or as directed.  Do not drink alcohol while taking pain medicine.  Do not use tampons, douche, or have sexual intercourse for 2 weeks or until your health care provider says it is okay.  Take your temperature twice a day for 4-5 days. Write it down each time.  Follow your health care provider's advice about diet, exercise, and lifting.  If you develop constipation, you may:  Take a mild laxative if your health care provider approves.  Add bran foods to your diet.  Drink enough fluids  to keep your urine clear or pale yellow.  Try to have someone with you or available to you for the first 24-48 hours, especially if you were given a general anesthetic.  Follow up with your health care provider as directed. SEEK MEDICAL CARE IF:  You feel dizzy or lightheaded.  You feel sick to your stomach (nauseous).  You have abnormal vaginal discharge.  You have a rash.  You have pain that is not controlled with medicine. SEEK IMMEDIATE MEDICAL CARE IF:  You have bleeding that is heavier than a normal menstrual period.  You have a fever.  You have increasing cramps or pain, not controlled with medicine.  You have new belly (abdominal) pain.  You pass out.  You have pain in the tops of your shoulders (shoulder strap areas).  You have shortness of  breath. Document Released: 09/12/2013 Document Reviewed: 09/12/2013 [facility] Patient Information 2015 [place], [place]. This information is not intended to replace advice given to you by your health care provider. Make sure you discuss any questions you have with your health care provider. Hysteroscopy Hysteroscopy is a procedure used for looking inside the womb (uterus). It may be done for various reasons, including:  To evaluate abnormal bleeding, fibroid (benign, noncancerous) tumors, polyps, scar tissue (adhesions), and possibly cancer of the uterus.  To look for lumps (tumors) and other uterine growths.  To look for causes of why a woman cannot get pregnant (infertility), causes of recurrent loss of pregnancy (miscarriages), or a lost intrauterine device (IUD).  To perform a sterilization by blocking the fallopian tubes from inside the uterus. In this procedure, a thin, flexible tube with a tiny light and camera on the end of it (hysteroscope) is used to look inside the uterus. A hysteroscopy should be done right after a menstrual period to be sure you are not pregnant. LET [facility] CARE PROVIDER KNOW ABOUT:   Any allergies you have.  All medicines you are taking, including vitamins, herbs, eye drops, creams, and over-the-counter medicines.  Previous problems you or members of your family have had with the use of anesthetics.  Any blood disorders you have.  Previous surgeries you have had.  Medical conditions you have. RISKS AND COMPLICATIONS  Generally, this is a safe procedure. However, as with any procedure, complications can occur. Possible complications include:  Putting a hole in the uterus.  Excessive bleeding.  Infection.  Damage to the cervix.  Injury to other organs.  Allergic reaction to medicines.  Too much fluid used in the uterus for the procedure. BEFORE THE PROCEDURE   Ask your health care provider about changing or stopping any regular  medicines.  Do not take aspirin or blood thinners for 1 week before the procedure, or as directed by your health care provider. These can cause bleeding.  If you smoke, do not smoke for 2 weeks before the procedure.  In some cases, a medicine is placed in the cervix the day before the procedure. This medicine makes the cervix have a larger opening (dilate). This makes it easier for the instrument to be inserted into the uterus during the procedure.  Do not eat or drink anything for at least 8 hours before the surgery.  Arrange for someone to take you home after the procedure. PROCEDURE   You may be given a medicine to relax you (sedative). You may also be given one of the following:  A medicine that numbs the area around the cervix (local anesthetic).  A medicine  that makes you sleep through the procedure (general anesthetic).  The hysteroscope is inserted through the vagina into the uterus. The camera on the hysteroscope sends a picture to a TV screen. This gives the surgeon a good view inside the uterus.  During the procedure, air or a liquid is put into the uterus, which allows the surgeon to see better.  Sometimes, tissue is gently scraped from inside the uterus. These tissue samples are sent to a lab for testing. AFTER THE PROCEDURE   If you had a general anesthetic, you may be groggy for a couple hours after the procedure.  If you had a local anesthetic, you will be able to go home as soon as you are stable and feel ready.  You may have some cramping. This normally lasts for a couple days.  You may have bleeding, which varies from light spotting for a few days to menstrual-like bleeding for 3-7 days. This is normal.  If your test results are not back during the visit, make an appointment with your health care provider to find out the results. Document Released: 02/28/2001 Document Revised: 09/12/2013 Document Reviewed: 06/21/2013 [facility] Patient Information 2015 [place],  [place]. This information is not intended to replace advice given to you by your health care provider. Make sure you discuss any questions you have with your health care provider.  General Anesthesia, Care After Refer to this sheet in the next few weeks. These instructions provide you with information on caring for yourself after your procedure. Your health care provider may also give you more specific instructions. Your treatment has been planned according to current medical practices, but problems sometimes occur. Call your health care provider if you have any problems or questions after your procedure. WHAT TO EXPECT AFTER THE PROCEDURE After the procedure, it is typical to experience:  Sleepiness.  Nausea and vomiting. HOME CARE INSTRUCTIONS  For the first 24 hours after general anesthesia:  Have a responsible person with you.  Do not drive a car. If you are alone, do not take public transportation.  Do not drink alcohol.  Do not take medicine that has not been prescribed by your health care provider.  Do not sign important papers or make important decisions.  You may resume a normal diet and activities as directed by your health care provider.  Change bandages (dressings) as directed.  If you have questions or problems that seem related to general anesthesia, call the hospital and ask for the anesthetist or anesthesiologist on call. SEEK MEDICAL CARE IF:  You have nausea and vomiting that continue the day after anesthesia.  You develop a rash. SEEK IMMEDIATE MEDICAL CARE IF:   You have difficulty breathing.  You have chest pain.  You have any allergic problems. Document Released: 02/28/2001 Document Revised: 11/27/2013 Document Reviewed: 06/07/2013 [facility] Patient Information 2015 [place], [place]. This information is not intended to replace advice given to you by your health care provider. Make sure you discuss any questions you have with your health care  provider. PATIENT INSTRUCTIONS POST-ANESTHESIA  IMMEDIATELY FOLLOWING SURGERY:  Do not drive or operate machinery for the first twenty four hours after surgery.  Do not make any important decisions for twenty four hours after surgery or while taking narcotic pain medications or sedatives.  If you develop intractable nausea and vomiting or a severe headache please notify your doctor immediately.  FOLLOW-UP:  Please make an appointment with your surgeon as instructed. You do not need to follow up with anesthesia unless  specifically instructed to do so.  WOUND CARE INSTRUCTIONS (if applicable):  Keep a dry clean dressing on the anesthesia/puncture wound site if there is drainage.  Once the wound has quit draining you may leave it open to air.  Generally you should leave the bandage intact for twenty four hours unless there is drainage.  If the epidural site drains for more than 36-48 hours please call the anesthesia department.  QUESTIONS?:  Please feel free to call your physician or the hospital operator if you have any questions, and they will be happy to assist you.

## 2015-03-28 NOTE — Telephone Encounter (Signed)
My understanding was her surgery was re scheduled for this Wednesday 4/27? Please call and straighten out with patient

## 2015-04-02 ENCOUNTER — Ambulatory Visit (HOSPITAL_COMMUNITY): Payer: Commercial Managed Care - HMO | Admitting: Anesthesiology

## 2015-04-02 ENCOUNTER — Ambulatory Visit (HOSPITAL_COMMUNITY)
Admission: RE | Admit: 2015-04-02 | Discharge: 2015-04-02 | Disposition: A | Discharge status: Home / Self Care | Payer: Commercial Managed Care - HMO | Source: Ambulatory Visit | Attending: Obstetrics & Gynecology | Admitting: Obstetrics & Gynecology

## 2015-04-02 ENCOUNTER — Encounter (HOSPITAL_COMMUNITY)
Admission: RE | Disposition: A | Discharge status: Home / Self Care | Payer: Self-pay | Source: Ambulatory Visit | Attending: Obstetrics & Gynecology

## 2015-04-02 ENCOUNTER — Encounter (HOSPITAL_COMMUNITY): Payer: Self-pay | Admitting: *Deleted

## 2015-04-02 DIAGNOSIS — E785 Hyperlipidemia, unspecified: Secondary | ICD-10-CM | POA: Diagnosis not present

## 2015-04-02 DIAGNOSIS — Z7982 Long term (current) use of aspirin: Secondary | ICD-10-CM | POA: Insufficient documentation

## 2015-04-02 DIAGNOSIS — Z79899 Other long term (current) drug therapy: Secondary | ICD-10-CM | POA: Diagnosis not present

## 2015-04-02 DIAGNOSIS — G4730 Sleep apnea, unspecified: Secondary | ICD-10-CM | POA: Diagnosis not present

## 2015-04-02 DIAGNOSIS — I10 Essential (primary) hypertension: Secondary | ICD-10-CM | POA: Insufficient documentation

## 2015-04-02 DIAGNOSIS — Z91018 Allergy to other foods: Secondary | ICD-10-CM | POA: Insufficient documentation

## 2015-04-02 DIAGNOSIS — E669 Obesity, unspecified: Secondary | ICD-10-CM | POA: Insufficient documentation

## 2015-04-02 DIAGNOSIS — Z87891 Personal history of nicotine dependence: Secondary | ICD-10-CM | POA: Diagnosis not present

## 2015-04-02 DIAGNOSIS — J45909 Unspecified asthma, uncomplicated: Secondary | ICD-10-CM | POA: Insufficient documentation

## 2015-04-02 DIAGNOSIS — E119 Type 2 diabetes mellitus without complications: Secondary | ICD-10-CM | POA: Insufficient documentation

## 2015-04-02 DIAGNOSIS — N8500 Endometrial hyperplasia, unspecified: Secondary | ICD-10-CM | POA: Diagnosis not present

## 2015-04-02 DIAGNOSIS — N950 Postmenopausal bleeding: Secondary | ICD-10-CM | POA: Insufficient documentation

## 2015-04-02 DIAGNOSIS — Z8673 Personal history of transient ischemic attack (TIA), and cerebral infarction without residual deficits: Secondary | ICD-10-CM | POA: Diagnosis not present

## 2015-04-02 DIAGNOSIS — N8501 Benign endometrial hyperplasia: Secondary | ICD-10-CM | POA: Diagnosis not present

## 2015-04-02 DIAGNOSIS — R938 Abnormal findings on diagnostic imaging of other specified body structures: Secondary | ICD-10-CM | POA: Insufficient documentation

## 2015-04-02 DIAGNOSIS — D649 Anemia, unspecified: Secondary | ICD-10-CM | POA: Diagnosis not present

## 2015-04-02 DIAGNOSIS — Z888 Allergy status to other drugs, medicaments and biological substances status: Secondary | ICD-10-CM | POA: Insufficient documentation

## 2015-04-02 DIAGNOSIS — N8502 Endometrial intraepithelial neoplasia [EIN]: Secondary | ICD-10-CM | POA: Diagnosis not present

## 2015-04-02 HISTORY — PX: HYSTEROSCOPY W/D&C: SHX1775

## 2015-04-02 LAB — GLUCOSE, CAPILLARY
Glucose-Capillary: 83 mg/dL (ref 70–99)
Glucose-Capillary: 94 mg/dL (ref 70–99)

## 2015-04-02 SURGERY — DILATATION AND CURETTAGE /HYSTEROSCOPY
Anesthesia: General

## 2015-04-02 MED ORDER — LACTATED RINGERS IV SOLN
INTRAVENOUS | Status: DC
  Administered 2015-04-02: 09:00:00 via INTRAVENOUS
  Administered 2015-04-02: 1000 mL via INTRAVENOUS

## 2015-04-02 MED ORDER — MIDAZOLAM HCL 2 MG/2ML IJ SOLN
INTRAMUSCULAR | Status: AC
  Filled 2015-04-02: qty 2

## 2015-04-02 MED ORDER — SODIUM CHLORIDE 0.9 % IJ SOLN
INTRAMUSCULAR | Status: AC
  Filled 2015-04-02: qty 20

## 2015-04-02 MED ORDER — DEXTROSE 50 % IV SOLN
12.5000 g | Freq: Once | INTRAVENOUS | Status: AC
  Administered 2015-04-02: 12.5 g via INTRAVENOUS

## 2015-04-02 MED ORDER — PROPOFOL 10 MG/ML IV BOLUS
INTRAVENOUS | Status: AC
  Filled 2015-04-02: qty 20

## 2015-04-02 MED ORDER — MIDAZOLAM HCL 2 MG/2ML IJ SOLN
1.0000 mg | INTRAMUSCULAR | Status: DC | PRN
  Administered 2015-04-02: 2 mg via INTRAVENOUS

## 2015-04-02 MED ORDER — FENTANYL CITRATE (PF) 100 MCG/2ML IJ SOLN
INTRAMUSCULAR | Status: AC
  Filled 2015-04-02: qty 2

## 2015-04-02 MED ORDER — DEXTROSE 50 % IV SOLN
INTRAVENOUS | Status: AC
  Filled 2015-04-02: qty 50

## 2015-04-02 MED ORDER — FENTANYL CITRATE (PF) 100 MCG/2ML IJ SOLN
25.0000 ug | INTRAMUSCULAR | Status: DC | PRN
  Administered 2015-04-02: 25 ug via INTRAVENOUS
  Filled 2015-04-02: qty 2

## 2015-04-02 MED ORDER — ONDANSETRON HCL 4 MG/2ML IJ SOLN
4.0000 mg | Freq: Once | INTRAMUSCULAR | Status: AC
  Administered 2015-04-02: 4 mg via INTRAVENOUS

## 2015-04-02 MED ORDER — KETOROLAC TROMETHAMINE 10 MG PO TABS: 10.0000 mg | ORAL_TABLET | Freq: Three times a day (TID) | ORAL | Status: DC | PRN

## 2015-04-02 MED ORDER — HYDROCODONE-ACETAMINOPHEN 5-325 MG PO TABS: 1.0000 | ORAL_TABLET | Freq: Four times a day (QID) | ORAL | Status: DC | PRN

## 2015-04-02 MED ORDER — SUCCINYLCHOLINE CHLORIDE 20 MG/ML IJ SOLN
INTRAMUSCULAR | Status: AC
  Filled 2015-04-02: qty 1

## 2015-04-02 MED ORDER — FENTANYL CITRATE (PF) 100 MCG/2ML IJ SOLN
INTRAMUSCULAR | Status: DC | PRN
  Administered 2015-04-02 (×2): 25 ug via INTRAVENOUS

## 2015-04-02 MED ORDER — ALBUTEROL SULFATE HFA 108 (90 BASE) MCG/ACT IN AERS
INHALATION_SPRAY | RESPIRATORY_TRACT | Status: DC | PRN
  Administered 2015-04-02: 6 via RESPIRATORY_TRACT

## 2015-04-02 MED ORDER — LIDOCAINE HCL (CARDIAC) 20 MG/ML IV SOLN
INTRAVENOUS | Status: DC | PRN
  Administered 2015-04-02: 50 mg via INTRAVENOUS

## 2015-04-02 MED ORDER — ROCURONIUM BROMIDE 50 MG/5ML IV SOLN
INTRAVENOUS | Status: AC
  Filled 2015-04-02: qty 1

## 2015-04-02 MED ORDER — LIDOCAINE HCL (PF) 1 % IJ SOLN
INTRAMUSCULAR | Status: AC
  Filled 2015-04-02: qty 5

## 2015-04-02 MED ORDER — GLYCOPYRROLATE 0.2 MG/ML IJ SOLN
INTRAMUSCULAR | Status: AC
  Filled 2015-04-02: qty 1

## 2015-04-02 MED ORDER — ALBUTEROL SULFATE HFA 108 (90 BASE) MCG/ACT IN AERS
INHALATION_SPRAY | RESPIRATORY_TRACT | Status: AC
  Filled 2015-04-02: qty 6.7

## 2015-04-02 MED ORDER — CEFAZOLIN SODIUM 1-5 GM-% IV SOLN
INTRAVENOUS | Status: AC
  Filled 2015-04-02: qty 50

## 2015-04-02 MED ORDER — PHENYLEPHRINE HCL 10 MG/ML IJ SOLN
INTRAMUSCULAR | Status: AC
  Filled 2015-04-02: qty 1

## 2015-04-02 MED ORDER — EPHEDRINE SULFATE 50 MG/ML IJ SOLN
INTRAMUSCULAR | Status: AC
  Filled 2015-04-02: qty 1

## 2015-04-02 MED ORDER — SODIUM CHLORIDE 0.9 % IR SOLN
Status: DC | PRN
  Administered 2015-04-02: 1000 mL

## 2015-04-02 MED ORDER — GLYCOPYRROLATE 0.2 MG/ML IJ SOLN
0.2000 mg | Freq: Once | INTRAMUSCULAR | Status: AC
  Administered 2015-04-02: 0.2 mg via INTRAVENOUS

## 2015-04-02 MED ORDER — FENTANYL CITRATE (PF) 100 MCG/2ML IJ SOLN
25.0000 ug | INTRAMUSCULAR | Status: AC
  Administered 2015-04-02 (×2): 25 ug via INTRAVENOUS

## 2015-04-02 MED ORDER — ONDANSETRON HCL 4 MG/2ML IJ SOLN: 4.0000 mg | Freq: Once | INTRAMUSCULAR | Status: DC | PRN

## 2015-04-02 MED ORDER — KETOROLAC TROMETHAMINE 30 MG/ML IJ SOLN
30.0000 mg | Freq: Once | INTRAMUSCULAR | Status: AC
  Administered 2015-04-02: 30 mg via INTRAVENOUS
  Filled 2015-04-02: qty 1

## 2015-04-02 MED ORDER — DEXTROSE 5 % IV SOLN
3.0000 g | INTRAVENOUS | Status: AC
  Administered 2015-04-02: 3 g via INTRAVENOUS
  Filled 2015-04-02: qty 3000

## 2015-04-02 MED ORDER — ONDANSETRON HCL 4 MG/2ML IJ SOLN
INTRAMUSCULAR | Status: AC
  Filled 2015-04-02: qty 2

## 2015-04-02 MED ORDER — CEFAZOLIN SODIUM-DEXTROSE 2-3 GM-% IV SOLR
INTRAVENOUS | Status: AC
  Filled 2015-04-02: qty 50

## 2015-04-02 MED ORDER — PROPOFOL 10 MG/ML IV BOLUS
INTRAVENOUS | Status: DC | PRN
  Administered 2015-04-02: 150 mg via INTRAVENOUS

## 2015-04-02 MED ORDER — ONDANSETRON HCL 8 MG PO TABS: 8.0000 mg | ORAL_TABLET | Freq: Three times a day (TID) | ORAL | Status: DC | PRN

## 2015-04-02 MED ORDER — PHENYLEPHRINE HCL 10 MG/ML IJ SOLN
INTRAMUSCULAR | Status: DC | PRN
  Administered 2015-04-02 (×2): 100 ug via INTRAVENOUS

## 2015-04-02 MED ORDER — ARTIFICIAL TEARS OP OINT
TOPICAL_OINTMENT | OPHTHALMIC | Status: AC
  Filled 2015-04-02: qty 3.5

## 2015-04-02 MED ORDER — MEPERIDINE HCL 50 MG/ML IJ SOLN: 6.2500 mg | Freq: Once | INTRAMUSCULAR | Status: DC

## 2015-04-02 MED ORDER — SODIUM CHLORIDE 0.9 % IJ SOLN
INTRAMUSCULAR | Status: AC
  Filled 2015-04-02: qty 10

## 2015-04-02 SURGICAL SUPPLY — 28 items
BAG HAMPER (MISCELLANEOUS) ×3 IMPLANT
CLOTH BEACON ORANGE TIMEOUT ST (SAFETY) ×3 IMPLANT
COVER LIGHT HANDLE STERIS (MISCELLANEOUS) ×6 IMPLANT
FORMALIN 10 PREFIL 120ML (MISCELLANEOUS) ×3 IMPLANT
GAUZE SPONGE 4X4 16PLY XRAY LF (GAUZE/BANDAGES/DRESSINGS) ×3 IMPLANT
GLOVE BIOGEL PI IND STRL 7.0 (GLOVE) ×1 IMPLANT
GLOVE BIOGEL PI IND STRL 8 (GLOVE) ×1 IMPLANT
GLOVE BIOGEL PI INDICATOR 7.0 (GLOVE) ×2
GLOVE BIOGEL PI INDICATOR 8 (GLOVE) ×2
GLOVE ECLIPSE 6.5 STRL STRAW (GLOVE) ×3 IMPLANT
GLOVE ECLIPSE 8.0 STRL XLNG CF (GLOVE) ×3 IMPLANT
GLOVE EXAM NITRILE MD LF STRL (GLOVE) ×3 IMPLANT
GOWN STRL REUS W/TWL LRG LVL3 (GOWN DISPOSABLE) ×6 IMPLANT
GOWN STRL REUS W/TWL XL LVL3 (GOWN DISPOSABLE) ×3 IMPLANT
INST SET HYSTEROSCOPY (KITS) ×3 IMPLANT
IV NS 1000ML (IV SOLUTION) ×2
IV NS 1000ML BAXH (IV SOLUTION) ×1 IMPLANT
KIT ROOM TURNOVER AP CYSTO (KITS) ×3 IMPLANT
MANIFOLD NEPTUNE II (INSTRUMENTS) ×3 IMPLANT
NS IRRIG 1000ML POUR BTL (IV SOLUTION) ×3 IMPLANT
PACK BASIC III (CUSTOM PROCEDURE TRAY) ×2
PACK SRG BSC III STRL LF ECLPS (CUSTOM PROCEDURE TRAY) ×1 IMPLANT
PAD ARMBOARD 7.5X6 YLW CONV (MISCELLANEOUS) ×3 IMPLANT
PAD TELFA 3X4 1S STER (GAUZE/BANDAGES/DRESSINGS) ×3 IMPLANT
SET IRRIG Y TYPE TUR BLADDER L (SET/KITS/TRAYS/PACK) ×3 IMPLANT
SHEET LAVH (DRAPES) ×3 IMPLANT
TOWEL OR 17X26 4PK STRL BLUE (TOWEL DISPOSABLE) ×3 IMPLANT
YANKAUER SUCT BULB TIP 10FT TU (MISCELLANEOUS) ×3 IMPLANT

## 2015-04-02 NOTE — Anesthesia Preprocedure Evaluation (Signed)
Anesthesia Evaluation  Patient identified by MRN, date of birth, ID band Patient awake    Reviewed: Allergy & Precautions, H&P , NPO status , Patient's Chart, lab work & pertinent test results  Airway Mallampati: IV  TM Distance: >3 FB Neck ROM: Full    Dental  (+) Edentulous Upper   Pulmonary asthma , Current Smoker, former smoker,    + decreased breath sounds      Cardiovascular hypertension, Pt. on medications + Peripheral Vascular Disease + dysrhythmias Rhythm:Regular Rate:Normal     Neuro/Psych TIACVA    GI/Hepatic   Endo/Other  diabetes, Type 2, Oral Hypoglycemic AgentsMorbid obesity  Renal/GU      Musculoskeletal   Abdominal   Peds  Hematology   Anesthesia Other Findings   Reproductive/Obstetrics                             Anesthesia Physical Anesthesia Plan  ASA: III  Anesthesia Plan: General   Post-op Pain Management:    Induction: Intravenous  Airway Management Planned: LMA  Additional Equipment:   Intra-op Plan:   Post-operative Plan: Extubation in OR  Informed Consent: I have reviewed the patients History and Physical, chart, labs and discussed the procedure including the risks, benefits and alternatives for the proposed anesthesia with the patient or authorized representative who has indicated his/her understanding and acceptance.     Plan Discussed with:   Anesthesia Plan Comments:         Anesthesia Quick Evaluation

## 2015-04-02 NOTE — Op Note (Signed)
Preoperative diagnosis: Simple and complex endometrial hyperplasia, no atypia, found on office endometrial biopsy                                        Postmenopausal bleeding                                        Thickened endometrium, heterogeneous  Postoperative diagnosis: Same as above  Procedure: Diagnostic hysteroscopy with uterine curettage  Surgeon:  Florian Buff, MD  Anesthesia: Laryngeal mask airway  Findings:    The patient originally presented to our office with this menopausal bleeding.  Subsequently an office sonogram revealed a thickened heterogeneous endometrium which prompted me to perform in office endometrial biopsy.                   The biopsy returned as simple and complex endometrial hyperplasia no atypia or malignancy seen.  With that diagnosis was indicated to proceed with a more thorough endometrial evaluation with hysteroscopy and endometrial curettage                     Intraoperatively the endometrium for the most part was very smooth and appeared atrophic however on the posterior wall was endometrial development most likely lid to the office endometrial biopsy pathology report.  No polyps fibroids                   Or other abnormalities seen.  Prescription of operation:  Patient was taken to the operating room and placed in the supine position where she underwent laryngeal mask airway anesthetic. She was placed in the low lithotomy position . She was prepped and draped in the usual sterile fashion for vaginal surgery. A Graves speculum was placed in the anterior cervix was grasped with a single-tooth tenaculum. Cervix was dilated serially with Hegar dilators to allow passage of the hysteroscope. The above-noted findings were seen and the hysteroscopy was performed without difficulty. A vigorous uterine curettage was then performed specifically on the posterior endometrium where pathology and hysteroscopy was identified. The entire endometrium was aerated  to good uterine crie. The patient tolerated procedure well she experienced 25 cc of blood loss. She received 3 g of Ancef and 30 mg of Toradol preoperatively prophylactically. She was taken to recovery room in good stable condition all counts are correct 3.  She'll be seen back in the office next week for her pathology report and postoperative visit.  Florian Buff, MD 04/02/2015 9:29 AM

## 2015-04-02 NOTE — Anesthesia Postprocedure Evaluation (Signed)
  Anesthesia Post-op Note Late entry Patient: [name]  Procedure(s) Performed: Procedure(s): UTERINE CURETTAGE, HYSTEROSCOPY (N/A)  Patient Location: PACU  Anesthesia Type:General  Level of Consciousness: awake, alert , oriented and patient cooperative  Airway and Oxygen Therapy: Patient Spontanous Breathing  Post-op Pain: mild  Post-op Assessment: Post-op Vital signs reviewed, Patient's Cardiovascular Status Stable, Respiratory Function Stable, Patent Airway, No signs of Nausea or vomiting and Pain level controlled  Post-op Vital Signs: Reviewed and stable  Last Vitals:  Filed Vitals:   04/02/15 1014  BP:   Pulse: 92  Temp:   Resp: 16    Complications: No apparent anesthesia complications

## 2015-04-02 NOTE — Transfer of Care (Signed)
Immediate Anesthesia Transfer of Care Note  Patient: [name]  Procedure(s) Performed: Procedure(s): UTERINE CURETTAGE, HYSTEROSCOPY (N/A)  Patient Location: PACU  Anesthesia Type:General  Level of Consciousness: awake, oriented and patient cooperative  Airway & Oxygen Therapy: Patient Spontanous Breathing and Patient connected to face mask oxygen  Post-op Assessment: Report given to RN and Post -op Vital signs reviewed and stable  Post vital signs: Reviewed and stable  Last Vitals:  Filed Vitals:   04/02/15 0825  BP: 128/83  Pulse:   Temp:   Resp: 16    Complications: No apparent anesthesia complications

## 2015-04-02 NOTE — H&P (Signed)
Patient ID: [name], female DOB: 07-19-1941, 74 y.o. MRN: [id number]   Decision for surgery and preop H&P:  Preoperative History and Physical  [name] is a 74 y.o. G2P2 with Patient's last menstrual period was 03/31/2013. admitted for a hysteroscopy uterine curettage.  For evaluation of endometrium with diagnosis of simple and Complex hyperplasia without atypia by office endometrial biopsy  PMH:  Past Medical History  Diagnosis Date  . Diabetes mellitus   . Asthma   . Chronic knee pain   . Back pain, chronic   . Hypertension   . Vertigo   . Hyperlipidemia   . TIA (transient ischemic attack)   . Anemia   . HOH (hard of hearing)   . Arthritis   . Stroke     Mini Stroke  . Obesity   . PMB (postmenopausal bleeding) 11/26/2014  . Urge incontinence 11/26/2014  . Thickened endometrium 01/15/2015    PSH:  Past Surgical History  Procedure Laterality Date  . Appendectomy    . Ovary surgery    . Cataract extraction w/phaco Left 01/16/2013    Procedure: CATARACT EXTRACTION PHACO AND INTRAOCULAR LENS PLACEMENT (IOC); Surgeon: Elta Guadeloupe T. Gershon Crane, MD; Location: [facility]; Service: Ophthalmology; Laterality: Left; CDE:14.37  . Endometrial biopsy  04/03/2013      . Hysteroscopy w/d&c N/A 04/24/2013    Procedure: SUCTION DILATATION AND CURETTAGE /HYSTEROSCOPY; Surgeon: Jonnie Kind, MD; Location: [facility]; Service: Gynecology; Laterality: N/A;  . Knee arthroscopy with medial menisectomy Left 07/26/2013    Procedure: KNEE ARTHROSCOPY WITH PARTIAL MEDIAL MENISECTOMY; Surgeon: Sanjuana Kava, MD; Location: [facility]; Service: Orthopedics; Laterality: Left;  . Endometrial ablation    . Colonoscopy  2009    Dr. Wilford Corner: 4 hyperplastic polyps removed. Small internal hemorrhoids. Recommended 5 year follow-up colonoscopy.    POb/GynH:  OB History     Gravida Para Term Preterm AB TAB SAB Ectopic Multiple Living   '2 2        2      '$ SH:  History  Substance Use Topics  . Smoking status: Former Smoker -- 0.25 packs/day for 30 years    Types: Cigarettes  . Smokeless tobacco: Never Used  . Alcohol Use: No    FH:  Family History  Problem Relation Age of Onset  . Arrhythmia Father     Atrial fib/Pacemaker  . Heart failure Mother   . Diabetes Mother   . Other Mother     fell and broke hip  . Diabetes Sister   . Lupus Daughter   . Mental illness Son   . ADD / ADHD Son   . Other Son     soft bones  . Diabetes Maternal Grandmother   . Stroke Paternal Grandmother   . Arthritis Paternal Grandfather   . Other Sister     MVA  . Hypertension Brother   . Diabetes Sister   . Hypertension Sister   . Colon cancer Neg Hx      Allergies:  Allergies  Allergen Reactions  . Imitrex [Sumatriptan] Other (See Comments)    Unknown Reaction   . Mango Flavor     Nausea and vomiting  . Oysters [Shellfish Allergy]     Nausea and vomiting    Medications:  Current outpatient prescriptions:  . aspirin 81 MG tablet, Take 81 mg by mouth daily., Disp: , Rfl:  . CARTIA XT 120 MG 24 hr capsule, 120 mg daily. , Disp: , Rfl:  . cholecalciferol (VITAMIN D)  1000 UNITS tablet, Take 1,000 Units by mouth every morning. , Disp: , Rfl:  . levalbuterol (XOPENEX HFA) 45 MCG/ACT inhaler, Inhale 2 puffs into the lungs every 6 (six) hours as needed for wheezing or shortness of breath., Disp: 1 Inhaler, Rfl: 3 . naproxen sodium (ALEVE) 220 MG tablet, Take 220 mg by mouth 2 (two) times daily as needed (for pain). , Disp: , Rfl:  . polyethylene glycol-electrolytes (TRILYTE) 420 G solution, Take 4,000 mLs by mouth as directed., Disp: 4000 mL, Rfl: 0 . simvastatin (ZOCOR) 20 MG tablet, Take 20 mg  by mouth at bedtime. , Disp: , Rfl:   Review of Systems:   Review of Systems  Constitutional: Negative for fever, chills, weight loss, malaise/fatigue and diaphoresis.  HENT: Negative for hearing loss, ear pain, nosebleeds, congestion, sore throat, neck pain, tinnitus and ear discharge.  Eyes: Negative for blurred vision, double vision, photophobia, pain, discharge and redness.  Respiratory: Negative for cough, hemoptysis, sputum production, shortness of breath, wheezing and stridor.  Cardiovascular: Negative for chest pain, palpitations, orthopnea, claudication, leg swelling and PND.  Gastrointestinal: Negative for abdominal pain. Negative for heartburn, nausea, vomiting, diarrhea, constipation, blood in stool and melena.  Genitourinary: Negative for dysuria, urgency, frequency, hematuria and flank pain.  Musculoskeletal: Negative for myalgias, back pain, joint pain and falls.  Skin: Negative for itching and rash.  Neurological: Negative for dizziness, tingling, tremors, sensory change, speech change, focal weakness, seizures, loss of consciousness, weakness and headaches.  Endo/Heme/Allergies: Negative for environmental allergies and polydipsia. Does not bruise/bleed easily.  Psychiatric/Behavioral: Negative for depression, suicidal ideas, hallucinations, memory loss and substance abuse. The patient is not nervous/anxious and does not have insomnia.     PHYSICAL EXAM:  Blood pressure 160/80, pulse 72, weight 292 lb (132.45 kg), last menstrual period 03/31/2013.   Vitals reviewed. Constitutional: She is oriented to person, place, and time. She appears well-developed and well-nourished.  HENT:  Head: Normocephalic and atraumatic.  Right Ear: External ear normal.  Left Ear: External ear normal.  Nose: Nose normal.  Mouth/Throat: Oropharynx is clear and moist.  Eyes: Conjunctivae and EOM are normal. Pupils are equal, round, and reactive to light. Right eye exhibits no discharge.  Left eye exhibits no discharge. No scleral icterus.  Neck: Normal range of motion. Neck supple. No tracheal deviation present. No thyromegaly present.  Cardiovascular: Normal rate, regular rhythm, normal heart sounds and intact distal pulses. Exam reveals no gallop and no friction rub.  No murmur heard. Respiratory: Effort normal and breath sounds normal. No respiratory distress. She has no wheezes. She has no rales. She exhibits no tenderness.  GI: Soft. Bowel sounds are normal. She exhibits no distension and no mass. There is tenderness. There is no rebound and no guarding.  Genitourinary:   Vulva is normal without lesions Vagina is pink moist without discharge Cervix normal in appearance and pap is normal Uterus is normal by sonogram, obesity Adnexa is negative with normal sized ovaries by sonogram  Musculoskeletal: Normal range of motion. She exhibits no edema and no tenderness.  Neurological: She is alert and oriented to person, place, and time. She has normal reflexes. She displays normal reflexes. No cranial nerve deficit. She exhibits normal muscle tone. Coordination normal.  Skin: Skin is warm and dry. No rash noted. No erythema. No pallor.  Psychiatric: She has a normal mood and affect. Her behavior is normal. Judgment and thought content normal.    Labs: No results found for this or any previous visit (from  the past 336 hour(s)).  EKG: Orders placed or performed during the hospital encounter of 07/26/14  . EKG  . EKG  . EKG    Imaging Studies:  Imaging Results    US Transvaginal Non-ob  01/16/2015 GYNECOLOGIC SONOGRAM [name] is a 74 y.o. G2P2 for a pelvic sonogram for PM bleeding. Uterus 7.4 x 6.1 x 4.3 cm, anteverted with 1.9cm fibroid noted Endometrium 9.5 mm, symmetrical, no obvious solitary/single mass noted within Right ovary Unable to view Rt ovarian tissue although Rt hydrosalpinx noted = 6.2  x 1.4 cm, Left ovary 2.0 x 1.7 x 1.2 cm, No free fluid noted within the pelvis Technician Comments: Anteverted uterus With fibroid noted, Endometrium=9.34m, Rt hydrosalpinx noted, Lt ovary appears WNL, no free fluid noted within the pelvis MLazarus Gowda2/07/2015 11:22 AM Clinical Impression and recommendations: I have reviewed the sonogram results above. Combined with the patient's current clinical course, below are my impressions and any appropriate recommendations for management based on the sonographic findings: 1. Small anteflexed retroverted uterus,notable for a: irregular endometrium with no dominant mass, Slight thickening of endometrium, warrants endometrial sampling. 2. Stable small tubular fluid structure in right adnexa most consistent with right hydrosalpinx. FERGUSON,JOHN V    Endometrial biopsy report reveals simple and complex hyperplasia without atypia, needs more thorough full evaluation + therapeutic   Assessment: Simple and complex hyperplasia without atypia(on office biopsy)   Patient Active Problem List   Diagnosis Date Noted  . Complex endometrial hyperplasia without atypia 02/11/2015  . Simple endometrial hyperplasia without atypia 02/11/2015  . Change in bowel habits 02/07/2015  . Thickened endometrium 01/15/2015  . PMB (postmenopausal bleeding) 11/26/2014  . Urge incontinence 11/26/2014  . Cerebral embolism with cerebral infarction 07/27/2014  . Left leg weakness 07/26/2014  . Right frontal lobe lesion 07/26/2014  . Hyperlipidemia 03/13/2013  . Sleep apnea 03/13/2013  . New onset atrial fibrillation 03/13/2013  . Near syncope 03/13/2013  . Atrial fibrillation 03/12/2013  . HYPERCHOLESTEROLEMIA 05/09/2009  . SINUSITIS 05/09/2009  . BUNION, RIGHT FOOT 05/09/2009  . SHINGLES 11/07/2008  . SUPRASPINATUS TENDINITIS 08/28/2008  . DIABETES MELLITUS, CONTROLLED  08/21/2008  . SHOULDER PAIN, LEFT 08/21/2008  . SPRAIN AND STRAIN OF STERNOCLAVICULAR 07/04/2008  . OBESITY, MORBID 02/02/2008  . PROTEINURIA 02/02/2008  . DIABETES MELLITUS, TYPE II, UNCONTROLLED 12/20/2007  . ANEMIA 12/20/2007  . Essential hypertension, benign 12/20/2007  . ASTHMA 12/20/2007  . Unspecified arthropathy, lower leg 12/20/2007    Plan: Hysteroscopy uterine curettage     For full diagnostic and therapeutic purposes  Griffen Frayne H 04/02/2015 8:06 AM

## 2015-04-02 NOTE — Discharge Instructions (Signed)
Hysteroscopy, Care After °Refer to this sheet in the next few weeks. These instructions provide you with information on caring for yourself after your procedure. Your health care provider may also give you more specific instructions. Your treatment has been planned according to current medical practices, but problems sometimes occur. Call your health care provider if you have any problems or questions after your procedure.  °WHAT TO EXPECT AFTER THE PROCEDURE °After your procedure, it is typical to have the following: °· You may have some cramping. This normally lasts for a couple days. °· You may have bleeding. This can vary from light spotting for a few days to menstrual-like bleeding for 3-7 days. °HOME CARE INSTRUCTIONS °· Rest for the first 1-2 days after the procedure. °· Only take over-the-counter or prescription medicines as directed by your health care provider. Do not take aspirin. It can increase the chances of bleeding. °· Take showers instead of baths for 2 weeks or as directed by your health care provider. °· Do not drive for 24 hours or as directed. °· Do not drink alcohol while taking pain medicine. °· Do not use tampons, douche, or have sexual intercourse for 2 weeks or until your health care provider says it is okay. °· Take your temperature twice a day for 4-5 days. Write it down each time. °· Follow your health care provider's advice about diet, exercise, and lifting. °· If you develop constipation, you may: °¨ Take a mild laxative if your health care provider approves. °¨ Add bran foods to your diet. °¨ Drink enough fluids to keep your urine clear or pale yellow. °· Try to have someone with you or available to you for the first 24-48 hours, especially if you were given a general anesthetic. °· Follow up with your health care provider as directed. °SEEK MEDICAL CARE IF: °· You feel dizzy or lightheaded. °· You feel sick to your stomach (nauseous). °· You have abnormal vaginal discharge. °· You  have a rash. °· You have pain that is not controlled with medicine. °SEEK IMMEDIATE MEDICAL CARE IF: °· You have bleeding that is heavier than a normal menstrual period. °· You have a fever. °· You have increasing cramps or pain, not controlled with medicine. °· You have new belly (abdominal) pain. °· You pass out. °· You have pain in the tops of your shoulders (shoulder strap areas). °· You have shortness of breath. °Document Released: 09/12/2013 Document Reviewed: 09/12/2013 °[employer]® Patient Information ©2015 [employer]. This information is not intended to replace advice given to you by your health care provider. Make sure you discuss any questions you have with your health care provider. ° °

## 2015-04-02 NOTE — Anesthesia Procedure Notes (Signed)
Procedure Name: LMA Insertion Date/Time: 04/02/2015 8:42 AM Performed by: Andree Elk, AMY A Pre-anesthesia Checklist: Timeout performed, Patient identified, Emergency Drugs available, Suction available and Patient being monitored Patient Re-evaluated:Patient Re-evaluated prior to inductionOxygen Delivery Method: Circle system utilized Intubation Type: IV induction Ventilation: Two handed mask ventilation required LMA: LMA inserted LMA Size: 4.0 Number of attempts: 1 Placement Confirmation: positive ETCO2 and breath sounds checked- equal and bilateral Tube secured with: Tape Dental Injury: Teeth and Oropharynx as per pre-operative assessment

## 2015-04-03 ENCOUNTER — Encounter (HOSPITAL_COMMUNITY): Payer: Self-pay | Admitting: Obstetrics & Gynecology

## 2015-04-09 ENCOUNTER — Encounter: Payer: Self-pay | Admitting: Obstetrics & Gynecology

## 2015-04-09 ENCOUNTER — Encounter: Payer: Commercial Managed Care - HMO | Admitting: Obstetrics & Gynecology

## 2015-04-09 ENCOUNTER — Ambulatory Visit (INDEPENDENT_AMBULATORY_CARE_PROVIDER_SITE_OTHER): Payer: Commercial Managed Care - HMO | Admitting: Obstetrics & Gynecology

## 2015-04-09 VITALS — BP 150/90 | HR 72 | Wt 282.0 lb

## 2015-04-09 DIAGNOSIS — R938 Abnormal findings on diagnostic imaging of other specified body structures: Secondary | ICD-10-CM

## 2015-04-09 DIAGNOSIS — Z9889 Other specified postprocedural states: Secondary | ICD-10-CM | POA: Diagnosis not present

## 2015-04-09 MED ORDER — MEDROXYPROGESTERONE ACETATE 10 MG PO TABS: 10.0000 mg | ORAL_TABLET | Freq: Every day | ORAL | Status: DC

## 2015-04-09 NOTE — Progress Notes (Signed)
Patient ID: [name], female   DOB: 02-Feb-1941, 74 y.o.   MRN: [id number]  HPI: Patient returns for routine postoperative follow-up having undergone hysteroscopy uterine curettage on 04/02/2015 for simple and complex hyperplasia without atypia on office biopsy with thickened endometrium  The patient's immediate postoperative recovery has been unremarkable. Since hospital discharge the patient reports spotting.   Current Outpatient Prescriptions: acetaminophen (TYLENOL) 325 MG tablet, Take 650 mg by mouth every 6 (six) hours as needed., Disp: , Rfl:  aspirin 81 MG tablet, Take 81 mg by mouth daily., Disp: , Rfl:  CARTIA XT 120 MG 24 hr capsule, 120 mg daily. , Disp: , Rfl:  cholecalciferol (VITAMIN D) 1000 UNITS tablet, Take 1,000 Units by mouth every morning. , Disp: , Rfl:  HYDROcodone-acetaminophen (NORCO/VICODIN) 5-325 MG per tablet, Take 1 tablet by mouth every 6 (six) hours as needed., Disp: 30 tablet, Rfl: 0 hydrOXYzine (VISTARIL) 25 MG capsule, Take 4 capsules (100 mg total) by mouth 3 (three) times daily as needed for itching., Disp: 60 capsule, Rfl: 0 Linaclotide (LINZESS) 145 MCG CAPS capsule, 1 PO 30 mins prior to your first meal, Disp: 30 capsule, Rfl: 11 simvastatin (ZOCOR) 20 MG tablet, Take 20 mg by mouth at bedtime. , Disp: , Rfl:  TRADJENTA 5 MG TABS tablet, , Disp: , Rfl: 0 ketorolac (TORADOL) 10 MG tablet, Take 1 tablet (10 mg total) by mouth every 8 (eight) hours as needed. (Patient not taking: Reported on 04/09/2015), Disp: 15 tablet, Rfl: 0 naproxen sodium (ALEVE) 220 MG tablet, Take 220 mg by mouth 2 (two) times daily as needed (for pain). , Disp: , Rfl:  ondansetron (ZOFRAN) 8 MG tablet, Take 1 tablet (8 mg total) by mouth every 8 (eight) hours as needed for nausea. (Patient not taking: Reported on 04/09/2015), Disp: 12 tablet, Rfl: 0 valACYclovir (VALTREX) 1000 MG tablet, Take 1 tablet (1,000 mg total) by mouth 3 (three) times daily. (Patient not taking: Reported on  04/09/2015), Disp: 30 tablet, Rfl: 1  No current facility-administered medications for this visit.    Blood pressure 150/90, pulse 72, weight 282 lb (127.914 kg), last menstrual period 03/31/2013.  Physical Exam: Normal post curettage  Diagnostic Tests: none  Pathology: Simple and complex hyperplasia no atypia  Impression: S/P uterine curettage  Plan: Begin provera 10 mg daily  Follow up: 6  months  Florian Buff, MD

## 2015-05-06 ENCOUNTER — Telehealth: Payer: Self-pay | Admitting: Obstetrics & Gynecology

## 2015-05-06 NOTE — Telephone Encounter (Signed)
i called and answered [name] questions

## 2015-05-06 NOTE — Telephone Encounter (Signed)
Pt states had a Uterine Curettage Hysteroscopy on 04/02/2015, taking Provera 10 mg daily as Dr. Elonda Husky prescribed, c/o light vaginal bleeding and leg cramps. Is this normal?

## 2015-05-09 ENCOUNTER — Telehealth: Payer: Self-pay | Admitting: Nurse Practitioner

## 2015-05-09 DIAGNOSIS — G4733 Obstructive sleep apnea (adult) (pediatric): Secondary | ICD-10-CM | POA: Diagnosis not present

## 2015-05-09 DIAGNOSIS — E1165 Type 2 diabetes mellitus with hyperglycemia: Secondary | ICD-10-CM | POA: Diagnosis not present

## 2015-05-09 DIAGNOSIS — J441 Chronic obstructive pulmonary disease with (acute) exacerbation: Secondary | ICD-10-CM | POA: Diagnosis not present

## 2015-05-09 MED ORDER — LINACLOTIDE 145 MCG PO CAPS: ORAL_CAPSULE | ORAL | Status: DC

## 2015-05-09 NOTE — Telephone Encounter (Signed)
Returned a page to patient. She's requesting refills on the medications Dr. Oneida Alar gave her after her colonoscopy. She states "one is a laxative and it white and the other I don't remember the name or what it looks like." Reviewed colonoscopy report and only medication I can see that was recommended was Linzess. Refill sent in for this and the patient said she'll call when she finds the other medication.

## 2015-05-10 ENCOUNTER — Emergency Department (HOSPITAL_COMMUNITY): Payer: Commercial Managed Care - HMO

## 2015-05-10 ENCOUNTER — Encounter (HOSPITAL_COMMUNITY): Payer: Self-pay | Admitting: *Deleted

## 2015-05-10 ENCOUNTER — Inpatient Hospital Stay (HOSPITAL_COMMUNITY)
Admission: EM | Admit: 2015-05-10 | Discharge: 2015-05-14 | DRG: 065 | Disposition: A | Discharge status: Rehabilitation Facility | Payer: Commercial Managed Care - HMO | Attending: Internal Medicine | Admitting: Internal Medicine

## 2015-05-10 ENCOUNTER — Inpatient Hospital Stay (HOSPITAL_COMMUNITY): Payer: Commercial Managed Care - HMO

## 2015-05-10 DIAGNOSIS — I63411 Cerebral infarction due to embolism of right middle cerebral artery: Principal | ICD-10-CM | POA: Diagnosis present

## 2015-05-10 DIAGNOSIS — D649 Anemia, unspecified: Secondary | ICD-10-CM | POA: Diagnosis present

## 2015-05-10 DIAGNOSIS — H9190 Unspecified hearing loss, unspecified ear: Secondary | ICD-10-CM | POA: Diagnosis present

## 2015-05-10 DIAGNOSIS — E1142 Type 2 diabetes mellitus with diabetic polyneuropathy: Secondary | ICD-10-CM | POA: Diagnosis not present

## 2015-05-10 DIAGNOSIS — I482 Chronic atrial fibrillation: Secondary | ICD-10-CM | POA: Diagnosis not present

## 2015-05-10 DIAGNOSIS — K5900 Constipation, unspecified: Secondary | ICD-10-CM | POA: Diagnosis not present

## 2015-05-10 DIAGNOSIS — N939 Abnormal uterine and vaginal bleeding, unspecified: Secondary | ICD-10-CM

## 2015-05-10 DIAGNOSIS — Z8673 Personal history of transient ischemic attack (TIA), and cerebral infarction without residual deficits: Secondary | ICD-10-CM

## 2015-05-10 DIAGNOSIS — E785 Hyperlipidemia, unspecified: Secondary | ICD-10-CM | POA: Diagnosis not present

## 2015-05-10 DIAGNOSIS — I639 Cerebral infarction, unspecified: Secondary | ICD-10-CM | POA: Insufficient documentation

## 2015-05-10 DIAGNOSIS — I63421 Cerebral infarction due to embolism of right anterior cerebral artery: Secondary | ICD-10-CM | POA: Diagnosis not present

## 2015-05-10 DIAGNOSIS — N182 Chronic kidney disease, stage 2 (mild): Secondary | ICD-10-CM | POA: Diagnosis not present

## 2015-05-10 DIAGNOSIS — Z79899 Other long term (current) drug therapy: Secondary | ICD-10-CM | POA: Diagnosis not present

## 2015-05-10 DIAGNOSIS — R4701 Aphasia: Secondary | ICD-10-CM | POA: Diagnosis not present

## 2015-05-10 DIAGNOSIS — G8194 Hemiplegia, unspecified affecting left nondominant side: Secondary | ICD-10-CM | POA: Diagnosis not present

## 2015-05-10 DIAGNOSIS — I4891 Unspecified atrial fibrillation: Secondary | ICD-10-CM | POA: Diagnosis not present

## 2015-05-10 DIAGNOSIS — M6281 Muscle weakness (generalized): Secondary | ICD-10-CM | POA: Diagnosis not present

## 2015-05-10 DIAGNOSIS — Z743 Need for continuous supervision: Secondary | ICD-10-CM | POA: Diagnosis not present

## 2015-05-10 DIAGNOSIS — Z7982 Long term (current) use of aspirin: Secondary | ICD-10-CM

## 2015-05-10 DIAGNOSIS — Z87891 Personal history of nicotine dependence: Secondary | ICD-10-CM | POA: Diagnosis not present

## 2015-05-10 DIAGNOSIS — E118 Type 2 diabetes mellitus with unspecified complications: Secondary | ICD-10-CM | POA: Diagnosis not present

## 2015-05-10 DIAGNOSIS — I63521 Cerebral infarction due to unspecified occlusion or stenosis of right anterior cerebral artery: Secondary | ICD-10-CM | POA: Diagnosis not present

## 2015-05-10 DIAGNOSIS — Z6841 Body Mass Index (BMI) 40.0 and over, adult: Secondary | ICD-10-CM | POA: Diagnosis not present

## 2015-05-10 DIAGNOSIS — N950 Postmenopausal bleeding: Secondary | ICD-10-CM | POA: Diagnosis not present

## 2015-05-10 DIAGNOSIS — E1122 Type 2 diabetes mellitus with diabetic chronic kidney disease: Secondary | ICD-10-CM | POA: Diagnosis present

## 2015-05-10 DIAGNOSIS — Z823 Family history of stroke: Secondary | ICD-10-CM | POA: Diagnosis not present

## 2015-05-10 DIAGNOSIS — I6789 Other cerebrovascular disease: Secondary | ICD-10-CM | POA: Diagnosis not present

## 2015-05-10 DIAGNOSIS — M6289 Other specified disorders of muscle: Secondary | ICD-10-CM | POA: Diagnosis not present

## 2015-05-10 DIAGNOSIS — I480 Paroxysmal atrial fibrillation: Secondary | ICD-10-CM | POA: Diagnosis not present

## 2015-05-10 DIAGNOSIS — I10 Essential (primary) hypertension: Secondary | ICD-10-CM | POA: Diagnosis not present

## 2015-05-10 DIAGNOSIS — G4730 Sleep apnea, unspecified: Secondary | ICD-10-CM | POA: Diagnosis present

## 2015-05-10 DIAGNOSIS — N319 Neuromuscular dysfunction of bladder, unspecified: Secondary | ICD-10-CM | POA: Diagnosis not present

## 2015-05-10 DIAGNOSIS — R531 Weakness: Secondary | ICD-10-CM | POA: Diagnosis present

## 2015-05-10 DIAGNOSIS — I739 Peripheral vascular disease, unspecified: Secondary | ICD-10-CM | POA: Diagnosis not present

## 2015-05-10 DIAGNOSIS — I6340 Cerebral infarction due to embolism of unspecified cerebral artery: Secondary | ICD-10-CM | POA: Diagnosis not present

## 2015-05-10 DIAGNOSIS — I498 Other specified cardiac arrhythmias: Secondary | ICD-10-CM | POA: Diagnosis not present

## 2015-05-10 DIAGNOSIS — I129 Hypertensive chronic kidney disease with stage 1 through stage 4 chronic kidney disease, or unspecified chronic kidney disease: Secondary | ICD-10-CM | POA: Diagnosis present

## 2015-05-10 LAB — COMPREHENSIVE METABOLIC PANEL
ALT: 12 U/L — ABNORMAL LOW (ref 14–54)
AST: 14 U/L — ABNORMAL LOW (ref 15–41)
Albumin: 3.5 g/dL (ref 3.5–5.0)
Alkaline Phosphatase: 46 U/L (ref 38–126)
Anion gap: 8 (ref 5–15)
BUN: 27 mg/dL — ABNORMAL HIGH (ref 6–20)
CO2: 25 mmol/L (ref 22–32)
Calcium: 8.7 mg/dL — ABNORMAL LOW (ref 8.9–10.3)
Chloride: 108 mmol/L (ref 101–111)
Creatinine, Ser: 1.33 mg/dL — ABNORMAL HIGH (ref 0.44–1.00)
GFR calc Af Amer: 45 mL/min — ABNORMAL LOW (ref >60)
GFR calc non Af Amer: 39 mL/min — ABNORMAL LOW (ref >60)
Glucose, Bld: 116 mg/dL — ABNORMAL HIGH (ref 65–99)
Potassium: 5 mmol/L (ref 3.5–5.1)
Sodium: 141 mmol/L (ref 135–145)
Total Bilirubin: 0.7 mg/dL (ref 0.3–1.2)
Total Protein: 6.7 g/dL (ref 6.5–8.1)

## 2015-05-10 LAB — CBC
HCT: 38.1 % (ref 36.0–46.0)
HCT: 36.7 % (ref 36.0–46.0)
Hemoglobin: 12.6 g/dL (ref 12.0–15.0)
Hemoglobin: 11.9 g/dL — ABNORMAL LOW (ref 12.0–15.0)
MCH: 33.2 pg (ref 26.0–34.0)
MCH: 32 pg (ref 26.0–34.0)
MCHC: 33.1 g/dL (ref 30.0–36.0)
MCHC: 32.4 g/dL (ref 30.0–36.0)
MCV: 100.3 fL — ABNORMAL HIGH (ref 78.0–100.0)
MCV: 98.7 fL (ref 78.0–100.0)
Platelets: 175 10*3/uL (ref 150–400)
Platelets: 184 10*3/uL (ref 150–400)
RBC: 3.8 MIL/uL — ABNORMAL LOW (ref 3.87–5.11)
RBC: 3.72 MIL/uL — ABNORMAL LOW (ref 3.87–5.11)
RDW: 13.7 % (ref 11.5–15.5)
RDW: 13.6 % (ref 11.5–15.5)
WBC: 4.4 10*3/uL (ref 4.0–10.5)
WBC: 4.6 10*3/uL (ref 4.0–10.5)

## 2015-05-10 LAB — DIFFERENTIAL
Basophils Absolute: 0 10*3/uL (ref 0.0–0.1)
Basophils Relative: 0 % (ref 0–1)
Eosinophils Absolute: 0 10*3/uL (ref 0.0–0.7)
Eosinophils Relative: 1 % (ref 0–5)
Lymphocytes Relative: 31 % (ref 12–46)
Lymphs Abs: 1.3 10*3/uL (ref 0.7–4.0)
Monocytes Absolute: 0.3 10*3/uL (ref 0.1–1.0)
Monocytes Relative: 7 % (ref 3–12)
Neutro Abs: 2.7 10*3/uL (ref 1.7–7.7)
Neutrophils Relative %: 61 % (ref 43–77)

## 2015-05-10 LAB — CREATININE, SERUM
Creatinine, Ser: 1.17 mg/dL — ABNORMAL HIGH (ref 0.44–1.00)
GFR calc Af Amer: 53 mL/min — ABNORMAL LOW (ref >60)
GFR calc non Af Amer: 45 mL/min — ABNORMAL LOW (ref >60)

## 2015-05-10 LAB — I-STAT CHEM 8, ED
BUN: 26 mg/dL — ABNORMAL HIGH (ref 6–20)
Calcium, Ion: 1.16 mmol/L (ref 1.13–1.30)
Chloride: 108 mmol/L (ref 101–111)
Creatinine, Ser: 1.3 mg/dL — ABNORMAL HIGH (ref 0.44–1.00)
Glucose, Bld: 114 mg/dL — ABNORMAL HIGH (ref 65–99)
HCT: 39 % (ref 36.0–46.0)
Hemoglobin: 13.3 g/dL (ref 12.0–15.0)
Potassium: 4.9 mmol/L (ref 3.5–5.1)
Sodium: 141 mmol/L (ref 135–145)
TCO2: 21 mmol/L (ref 0–100)

## 2015-05-10 LAB — MRSA PCR SCREENING: MRSA by PCR: NEGATIVE

## 2015-05-10 LAB — CBG MONITORING, ED: Glucose-Capillary: 106 mg/dL — ABNORMAL HIGH (ref 65–99)

## 2015-05-10 LAB — APTT: aPTT: 24 seconds (ref 24–37)

## 2015-05-10 LAB — ETHANOL: Alcohol, Ethyl (B): <5 mg/dL (ref <5)

## 2015-05-10 LAB — PROTIME-INR
INR: 0.98 (ref 0.00–1.49)
Prothrombin Time: 13.2 seconds (ref 11.6–15.2)

## 2015-05-10 LAB — GLUCOSE, CAPILLARY
Glucose-Capillary: 85 mg/dL (ref 65–99)
Glucose-Capillary: 117 mg/dL — ABNORMAL HIGH (ref 65–99)

## 2015-05-10 LAB — I-STAT TROPONIN, ED: Troponin i, poc: 0 ng/mL (ref 0.00–0.08)

## 2015-05-10 MED ORDER — SODIUM CHLORIDE 0.9 % IV BOLUS (SEPSIS)
500.0000 mL | Freq: Once | INTRAVENOUS | Status: AC
  Administered 2015-05-10: 500 mL via INTRAVENOUS

## 2015-05-10 MED ORDER — CHLORHEXIDINE GLUCONATE CLOTH 2 % EX PADS
6.0000 | MEDICATED_PAD | Freq: Once | CUTANEOUS | Status: AC
  Administered 2015-05-10: 6 via TOPICAL

## 2015-05-10 MED ORDER — INSULIN ASPART 100 UNIT/ML ~~LOC~~ SOLN: 0.0000 [IU] | Freq: Every day | SUBCUTANEOUS | Status: DC

## 2015-05-10 MED ORDER — ACETAMINOPHEN 650 MG RE SUPP: 650.0000 mg | RECTAL | Status: DC | PRN

## 2015-05-10 MED ORDER — SENNOSIDES-DOCUSATE SODIUM 8.6-50 MG PO TABS: 1.0000 | ORAL_TABLET | Freq: Every evening | ORAL | Status: DC | PRN

## 2015-05-10 MED ORDER — DILTIAZEM HCL ER COATED BEADS 120 MG PO CP24
120.0000 mg | ORAL_CAPSULE | Freq: Every day | ORAL | Status: DC
  Administered 2015-05-11 – 2015-05-14 (×4): 120 mg via ORAL
  Filled 2015-05-10 (×5): qty 1

## 2015-05-10 MED ORDER — HYDROXYZINE PAMOATE 50 MG PO CAPS: 100.0000 mg | ORAL_CAPSULE | Freq: Three times a day (TID) | ORAL | Status: DC | PRN

## 2015-05-10 MED ORDER — SODIUM CHLORIDE 0.9 % IV SOLN
INTRAVENOUS | Status: DC
  Administered 2015-05-10: 23:00:00 via INTRAVENOUS

## 2015-05-10 MED ORDER — ASPIRIN 300 MG RE SUPP: 300.0000 mg | Freq: Every day | RECTAL | Status: DC

## 2015-05-10 MED ORDER — ASPIRIN 325 MG PO TABS
325.0000 mg | ORAL_TABLET | Freq: Every day | ORAL | Status: DC
  Administered 2015-05-10 – 2015-05-13 (×4): 325 mg via ORAL
  Filled 2015-05-10 (×4): qty 1

## 2015-05-10 MED ORDER — HYDROXYZINE HCL 25 MG PO TABS: 50.0000 mg | ORAL_TABLET | Freq: Three times a day (TID) | ORAL | Status: DC | PRN

## 2015-05-10 MED ORDER — INSULIN ASPART 100 UNIT/ML ~~LOC~~ SOLN: 0.0000 [IU] | Freq: Three times a day (TID) | SUBCUTANEOUS | Status: DC

## 2015-05-10 MED ORDER — ACETAMINOPHEN 325 MG PO TABS
650.0000 mg | ORAL_TABLET | ORAL | Status: DC | PRN
  Administered 2015-05-10 – 2015-05-14 (×5): 650 mg via ORAL
  Filled 2015-05-10 (×5): qty 2

## 2015-05-10 MED ORDER — ENOXAPARIN SODIUM 40 MG/0.4ML ~~LOC~~ SOLN
40.0000 mg | SUBCUTANEOUS | Status: DC
  Administered 2015-05-10: 40 mg via SUBCUTANEOUS
  Filled 2015-05-10: qty 0.4

## 2015-05-10 MED ORDER — FESOTERODINE FUMARATE ER 4 MG PO TB24
4.0000 mg | ORAL_TABLET | Freq: Every day | ORAL | Status: DC
Start: 2015-05-11 — End: 2015-05-14
  Administered 2015-05-11 – 2015-05-14 (×4): 4 mg via ORAL
  Filled 2015-05-10 (×4): qty 1

## 2015-05-10 MED ORDER — STROKE: EARLY STAGES OF RECOVERY BOOK
Freq: Once | Status: AC
  Administered 2015-05-10: 23:00:00
  Filled 2015-05-10: qty 1

## 2015-05-10 MED ORDER — LINACLOTIDE 145 MCG PO CAPS
145.0000 ug | ORAL_CAPSULE | Freq: Every day | ORAL | Status: DC
  Administered 2015-05-10 – 2015-05-14 (×5): 145 ug via ORAL
  Filled 2015-05-10 (×5): qty 1

## 2015-05-10 MED ORDER — CHLORHEXIDINE GLUCONATE CLOTH 2 % EX PADS: 6.0000 | MEDICATED_PAD | Freq: Every day | CUTANEOUS | Status: DC

## 2015-05-10 MED ORDER — SIMVASTATIN 20 MG PO TABS
20.0000 mg | ORAL_TABLET | Freq: Every day | ORAL | Status: DC
  Administered 2015-05-10: 20 mg via ORAL
  Filled 2015-05-10: qty 1

## 2015-05-10 NOTE — Progress Notes (Signed)
10:10 am call from [place], coming to ct with Code Stroke. 10:14 patient being moved to CT table when beeper went off. 10;20 scan finished, pt removed from CT table to stretcher. Scan automatically sent to [facility] from scanner. 10:23 [facility] radiology called.Spoke with Erline Levine. 10:25 called [facility] to confirm arrival of images. Images had arrived.  bbj

## 2015-05-10 NOTE — ED Notes (Signed)
Pt reports new left side face "tingling" and increased left arm weakness. Dr. Eulis Foster. Notified. MD states symptoms are likely waxing and waning. No new orders or change in care plan at this time.

## 2015-05-10 NOTE — ED Notes (Addendum)
Pt states numbess to left arm began ~30 min PTA. Pt states she was unable to make a fist when the numbness first began. Pt states numbness goes into her shoulder and feels a pinch in her neck/shoulder area, "like a pulled muscle" Pt states same symptoms as previous stroke. 1005: Pt now states initial symptoms began yesterday, including trouble using left arm and "having to take baby steps" since yesterday. Pt states she was seen by Dr. Legrand Rams yesterday as well. Pt was taken off of blood thinners recently due to vaginal bleeding.

## 2015-05-10 NOTE — ED Notes (Signed)
Pt urinated, unable to collect sample

## 2015-05-10 NOTE — ED Provider Notes (Signed)
CSN: [id number]     Arrival date & time 05/10/15  8366 History   First MD Initiated Contact with Patient 05/10/15 1012     Chief Complaint  Patient presents with  . Code Stroke     (Consider location/radiation/quality/duration/timing/severity/associated sxs/prior Treatment) The history is provided by the patient and a relative.    [name] is a 74 y.o. female  who presents for evaluation of weakness of left arm and leg. Best time of onset is 9:30 AM, today. At the time she had been sitting in a car traveling to view a house. She was brought here by private vehicle. Patient has had some additional symptoms in the last 24 hours including difficulty walking, described as taking "baby steps", and noted transient numbness of the left arm when she was trying to put a seatbelt on earlier today. Her daughter was with her this morning. The patient got up, walked to the kitchen table, sat down and later walked to the car. The daughter describes difficulty getting her into the car, but the additional weakness described above started after that. She is not having difficulty talking today. She states that the numb feeling in her left arm has improved. She has been treated recently for vaginal bleeding, had a D&C about a month ago and yesterday her PCP thought it was time to go back on anticoagulation medications. Apparently anticoagulation, is for "prior stroke". There been no other recent illnesses. There are no other known modifying factors.     Past Medical History  Diagnosis Date  . Diabetes mellitus   . Asthma   . Chronic knee pain   . Back pain, chronic   . Hypertension   . Vertigo   . Hyperlipidemia   . TIA (transient ischemic attack)   . Anemia   . HOH (hard of hearing)   . Arthritis   . Obesity   . PMB (postmenopausal bleeding) 11/26/2014  . Urge incontinence 11/26/2014  . Thickened endometrium 01/15/2015  . Dysrhythmia   . Stroke     Mini Stroke   Past Surgical History   Procedure Laterality Date  . Appendectomy    . Ovary surgery    . Cataract extraction w/phaco Left 01/16/2013    Procedure: CATARACT EXTRACTION PHACO AND INTRAOCULAR LENS PLACEMENT (IOC);  Surgeon: Elta Guadeloupe T. Gershon Crane, MD;  Location: [facility];  Service: Ophthalmology;  Laterality: Left;  CDE:14.37  . Endometrial biopsy  04/03/2013       . Hysteroscopy w/d&c N/A 04/24/2013    Procedure: SUCTION DILATATION AND CURETTAGE /HYSTEROSCOPY;  Surgeon: Jonnie Kind, MD;  Location: [facility];  Service: Gynecology;  Laterality: N/A;  . Knee arthroscopy with medial menisectomy Left 07/26/2013    Procedure: KNEE ARTHROSCOPY WITH PARTIAL MEDIAL MENISECTOMY;  Surgeon: Sanjuana Kava, MD;  Location: [facility];  Service: Orthopedics;  Laterality: Left;  . Endometrial ablation    . Colonoscopy  2009    Dr. Wilford Corner: 4 hyperplastic polyps removed. Small internal hemorrhoids. Recommended 5 year follow-up colonoscopy.  . Colonoscopy N/A 02/24/2015    Procedure: COLONOSCOPY;  Surgeon: Danie Binder, MD;  Location: [facility];  Service: Endoscopy;  Laterality: N/A;  1030  . Hysteroscopy w/d&c N/A 04/02/2015    Procedure: UTERINE CURETTAGE, HYSTEROSCOPY;  Surgeon: Florian Buff, MD;  Location: [facility];  Service: Gynecology;  Laterality: N/A;   Family History  Problem Relation Age of Onset  . Arrhythmia Father     Atrial fib/Pacemaker  . Heart failure Mother   .  Diabetes Mother   . Other Mother     fell and broke hip  . Diabetes Sister   . Lupus Daughter   . Mental illness Son   . ADD / ADHD Son   . Other Son     soft bones  . Diabetes Maternal Grandmother   . Stroke Paternal Grandmother   . Arthritis Paternal Grandfather   . Other Sister     MVA  . Hypertension Brother   . Diabetes Sister   . Hypertension Sister   . Colon cancer Neg Hx    History  Substance Use Topics  . Smoking status: Former Smoker -- 0.25 packs/day for 30 years    Types: Cigarettes  . Smokeless tobacco: Never Used  . Alcohol  Use: No   OB History    Gravida Para Term Preterm AB TAB SAB Ectopic Multiple Living   '2 2        2     '$ Review of Systems  All other systems reviewed and are negative.     Allergies  Imitrex; Mango flavor; and Oysters  Home Medications   Prior to Admission medications   Medication Sig Start Date End Date Taking? Authorizing Provider  acetaminophen (TYLENOL) 325 MG tablet Take 650 mg by mouth every 6 (six) hours as needed for mild pain.     Historical Provider, MD  aspirin 81 MG tablet Take 81 mg by mouth daily.    Historical Provider, MD  CARTIA XT 120 MG 24 hr capsule Take 120 mg by mouth daily.  11/25/14   Historical Provider, MD  cholecalciferol (VITAMIN D) 1000 UNITS tablet Take 1,000 Units by mouth every morning.     Historical Provider, MD  HYDROcodone-acetaminophen (NORCO/VICODIN) 5-325 MG per tablet Take 1 tablet by mouth every 6 (six) hours as needed. Patient not taking: Reported on 05/10/2015 04/02/15   Florian Buff, MD  hydrOXYzine (VISTARIL) 25 MG capsule Take 4 capsules (100 mg total) by mouth 3 (three) times daily as needed for itching. 03/18/15   Florian Buff, MD  ketorolac (TORADOL) 10 MG tablet Take 1 tablet (10 mg total) by mouth every 8 (eight) hours as needed. Patient not taking: Reported on 04/09/2015 04/02/15   Florian Buff, MD  Linaclotide [facility]) 145 MCG CAPS capsule 1 PO 30 mins prior to your first meal 05/09/15   Carlis Stable, NP  medroxyPROGESTERone (PROVERA) 10 MG tablet Take 1 tablet (10 mg total) by mouth daily. 04/09/15   Florian Buff, MD  naproxen sodium (ALEVE) 220 MG tablet Take 220 mg by mouth 2 (two) times daily as needed (for pain).     Historical Provider, MD  ondansetron (ZOFRAN) 8 MG tablet Take 1 tablet (8 mg total) by mouth every 8 (eight) hours as needed for nausea. Patient not taking: Reported on 04/09/2015 04/02/15   Florian Buff, MD  simvastatin (ZOCOR) 20 MG tablet Take 20 mg by mouth at bedtime.     Historical Provider, MD  TOVIAZ 4 MG TB24  tablet Take 1 tablet by mouth daily. 05/09/15   Historical Provider, MD  TRADJENTA 5 MG TABS tablet Take 5 mg by mouth daily.  02/26/15   Historical Provider, MD  valACYclovir (VALTREX) 1000 MG tablet Take 1 tablet (1,000 mg total) by mouth 3 (three) times daily. Patient not taking: Reported on 04/09/2015 03/12/15   Florian Buff, MD   BP 148/99 mmHg  Pulse 70  Temp(Src) 98.3 F (36.8 C) (Oral)  Resp  20  Ht '4\' 11"'$  (1.499 m)  Wt 282 lb (127.914 kg)  BMI 56.93 kg/m2  SpO2 98%  LMP 03/31/2013 Physical Exam  Constitutional: She is oriented to person, place, and time. She appears well-developed.  Obese  HENT:  Head: Normocephalic and atraumatic.  Right Ear: External ear normal.  Left Ear: External ear normal.  Eyes: Conjunctivae and EOM are normal. Pupils are equal, round, and reactive to light.  Neck: Normal range of motion and phonation normal. Neck supple.  Cardiovascular: Normal heart sounds.   Irregular tachycardia  Pulmonary/Chest: Effort normal and breath sounds normal. She exhibits no bony tenderness.  Abdominal: Soft. There is no tenderness.  Musculoskeletal: Normal range of motion.  Neurological: She is alert and oriented to person, place, and time. No cranial nerve deficit or sensory deficit.  No dysarthria or aphasia. Weakness left arm and leg, 2 over 5. No cranial nerve deficit appreciated.  Skin: Skin is warm, dry and intact.  Psychiatric: She has a normal mood and affect. Her behavior is normal. Judgment and thought content normal.  Nursing note and vitals reviewed.   ED Course  Procedures (including critical care time)  10:10 AM- declared code stroke with time of onset 0930 hours. TPA is likely going to be required.   Stroke neurology; preliminary consultation with nurse, done  10:38- Discussed with gynecology, Dr. Glo Herring, regarding her recent D&C with vaginal spotting which is ongoing. Dr. Johnnye Sima thought process at this point is that she is not at high risk for  severe uterine bleeding, if TPA is administered. Additionally, he believes that she could be treated therapeutically from a gynecologic standpoint. Should she have increased uterine bleeding following administration of TPA.  11:12 AM- consultation complete with total in neurology, who believes that patient's stroke is subacute, likely yesterday, and that she is not a candidate for TPA. He felt like her symptoms were actually improving since arrival in the emergency department. He recommends usual poststroke treatment and evaluation.  11:14 AM-Consult complete with Hospitalist. Patient case explained and discussed. He agrees to admit patient for further evaluation and treatment. Call ended at 11:30  Medications - No data to display  Patient Vitals for the past 24 hrs:  BP Temp Temp src Pulse Resp SpO2 Height Weight  05/10/15 1045 148/99 mmHg - - 70 20 98 % - -  05/10/15 1033 127/82 mmHg - - 93 24 96 % - -  05/10/15 1030 (!) 138/110 mmHg 98.3 F (36.8 C) Oral (!) 47 21 97 % - -  05/10/15 1018 - - - - - - '4\' 11"'$  (1.499 m) 282 lb (127.914 kg)  05/10/15 1018 119/83 mmHg - - 112 20 95 % - -    At time of admission- Reevaluation with update and discussion. After initial assessment and treatment, an updated evaluation reveals clinical status, not appreciably changed. Cierah Crader L      Labs Review Labs Reviewed  CBC - Abnormal; Notable for the following:    RBC 3.80 (*)    MCV 100.3 (*)    All other components within normal limits  COMPREHENSIVE METABOLIC PANEL - Abnormal; Notable for the following:    Glucose, Bld 116 (*)    BUN 27 (*)    Creatinine, Ser 1.33 (*)    Calcium 8.7 (*)    AST 14 (*)    ALT 12 (*)    GFR calc non Af Amer 39 (*)    GFR calc Af Amer 45 (*)    All  other components within normal limits  CBG MONITORING, ED - Abnormal; Notable for the following:    Glucose-Capillary 106 (*)    All other components within normal limits  I-STAT CHEM 8, ED - Abnormal; Notable  for the following:    BUN 26 (*)    Creatinine, Ser 1.30 (*)    Glucose, Bld 114 (*)    All other components within normal limits  ETHANOL  PROTIME-INR  APTT  DIFFERENTIAL  URINE RAPID DRUG SCREEN (HOSP PERFORMED) NOT AT [facility]  URINALYSIS, ROUTINE W REFLEX MICROSCOPIC (NOT AT [facility])  I-STAT TROPOININ, ED    Imaging Review Ct Head Wo Contrast  05/10/2015   CLINICAL DATA:  Code stroke, left arm weakness  EXAM: CT HEAD WITHOUT CONTRAST  TECHNIQUE: Contiguous axial images were obtained from the base of the skull through the vertex without intravenous contrast.  COMPARISON:  07/26/2014; 03/31/2013; brain MRI - 07/27/2014  FINDINGS: Examination is degraded due to a combination of patient body habitus as well as motion artifact.  Re- demonstrated extensive nearly confluent periventricular hypodensities compatible with microvascular ischemic disease. Given extensive background parenchymal abnormalities, there is no CT evidence of superimposed acute large territory infarct. No intraparenchymal or extra-axial mass or hemorrhage. Unchanged size a configuration of the ventricles and basilar cisterns. No midline shift. Intracranial atherosclerosis. There is minimal mucosal thickening involving the left frontal and anterior ethmoidal air cells. The remaining paranasal sinuses and mastoid air cells are normally aerated. No air-fluid levels. Post left-sided cataract surgery. Regional soft tissues appear otherwise normal. No displaced calvarial fracture.  IMPRESSION: 1. Degraded examination without definite acute intracranial process. 2. Similar findings of advanced microvascular ischemic disease. Critical Value/emergent results were called by telephone at the time of interpretation on 05/10/2015 at 10:27 am to Dr. Daleen Bo , who verbally acknowledged these results.   Electronically Signed   By: Sandi Mariscal M.D.   On: 05/10/2015 10:29     EKG Interpretation   Date/Time:  Saturday May 10 2015 10:03:15  EDT Ventricular Rate:  133 PR Interval:    QRS Duration: 80 QT Interval:  318 QTC Calculation: 473 R Axis:   71 Text Interpretation:  Atrial fibrillation Ventricular premature complex  Minimal ST depression, inferior leads Baseline wander in lead(s) V1 V2  Since last tracing Atrial fibrillation with rapid ventricular response is  new Confirmed by Eulis Foster  MD, Shweta Aman ([id number]) on 05/10/2015 10:18:20 AM      MDM   Final diagnoses:  Cerebral infarction due to unspecified mechanism  Paroxysmal atrial fibrillation  Vaginal bleeding    CVA, subacute, without indication for thrombolysis. Suspect thrombotic stroke, patient currently not anti-coagulated following gynecologic procedure. No complication related to vaginal bleeding.  Nursing Notes Reviewed/ Care Coordinated, and agree without changes. Applicable Imaging Reviewed.  Interpretation of Laboratory Data incorporated into ED treatment  Plan: Admit  Daleen Bo, MD 05/10/15 1351

## 2015-05-10 NOTE — H&P (Signed)
[facility] History and Physical  [name] UXN:[id number] DOB: 1941/01/28 DOA: 05/10/2015  Referring physician: Dr. Eulis Foster, ER PCP: Rosita Fire, MD   Chief Complaint: left sided weakness  HPI: [name] is a 74 y.o. female with multiple medical problems including atrial fibrillation, HTN, diabetes and recent vaginal bleeding. She presents to the hospital today with complaints of left arm and leg weakness. She reports onset of symptoms occuring at 4am on 6/3. She initially noted these symptoms with difficulty walking to the bathroom. She then felt weak in her arm and felt that she may have slept on her left arm causing weakness. Her symptoms persisted and today she felt the weakness in her arm and legs were worse. She denies any dysphagia, blurry/double vision, headache, chest pain or shortness of breath. She was evaluated in the ED as a code stroke, but when evaluated by tele neurology, it was felt her symptoms were improving and that she was already out of the window for tPA.   Of note, she underwent endometrial dilation and curettage for endometrial hyperplasia on 04/02/15. Since that time she has had spotting of vaginal blood. She followed up with her gynecologist on 04/09/15 and was started on provera to help control bleeding. She was previously taking eliquis for atrial fibrillation, but has not taken any anticoagulation in a few months due to issues with bleeding.  Review of Systems:  Pertinent positives as per HPI, otherwise negative  Past Medical History  Diagnosis Date  . Diabetes mellitus   . Asthma   . Chronic knee pain   . Back pain, chronic   . Hypertension   . Vertigo   . Hyperlipidemia   . TIA (transient ischemic attack)   . Anemia   . HOH (hard of hearing)   . Arthritis   . Obesity   . PMB (postmenopausal bleeding) 11/26/2014  . Urge incontinence 11/26/2014  . Thickened endometrium 01/15/2015  . Dysrhythmia   . Stroke     Mini Stroke   Past  Surgical History  Procedure Laterality Date  . Appendectomy    . Ovary surgery    . Cataract extraction w/phaco Left 01/16/2013    Procedure: CATARACT EXTRACTION PHACO AND INTRAOCULAR LENS PLACEMENT (IOC);  Surgeon: Elta Guadeloupe T. Gershon Crane, MD;  Location: [facility];  Service: Ophthalmology;  Laterality: Left;  CDE:14.37  . Endometrial biopsy  04/03/2013       . Hysteroscopy w/d&c N/A 04/24/2013    Procedure: SUCTION DILATATION AND CURETTAGE /HYSTEROSCOPY;  Surgeon: Jonnie Kind, MD;  Location: [facility];  Service: Gynecology;  Laterality: N/A;  . Knee arthroscopy with medial menisectomy Left 07/26/2013    Procedure: KNEE ARTHROSCOPY WITH PARTIAL MEDIAL MENISECTOMY;  Surgeon: Sanjuana Kava, MD;  Location: [facility];  Service: Orthopedics;  Laterality: Left;  . Endometrial ablation    . Colonoscopy  2009    Dr. Wilford Corner: 4 hyperplastic polyps removed. Small internal hemorrhoids. Recommended 5 year follow-up colonoscopy.  . Colonoscopy N/A 02/24/2015    Procedure: COLONOSCOPY;  Surgeon: Danie Binder, MD;  Location: [facility];  Service: Endoscopy;  Laterality: N/A;  1030  . Hysteroscopy w/d&c N/A 04/02/2015    Procedure: UTERINE CURETTAGE, HYSTEROSCOPY;  Surgeon: Florian Buff, MD;  Location: [facility];  Service: Gynecology;  Laterality: N/A;   Social History:  reports that she has quit smoking. Her smoking use included Cigarettes. She has a 7.5 pack-year smoking history. She has never used smokeless tobacco. She reports that she does not drink  alcohol or use illicit drugs.  Allergies  Allergen Reactions  . Imitrex [Sumatriptan] Other (See Comments)    Unknown Reaction   . Mango Flavor     Nausea and vomiting  . Oysters [Shellfish Allergy]     Nausea and vomiting    Family History  Problem Relation Age of Onset  . Arrhythmia Father     Atrial fib/Pacemaker  . Heart failure Mother   . Diabetes Mother   . Other Mother     fell and broke hip  . Diabetes Sister   . Lupus Daughter   . Mental  illness Son   . ADD / ADHD Son   . Other Son     soft bones  . Diabetes Maternal Grandmother   . Stroke Paternal Grandmother   . Arthritis Paternal Grandfather   . Other Sister     MVA  . Hypertension Brother   . Diabetes Sister   . Hypertension Sister   . Colon cancer Neg Hx     Prior to Admission medications   Medication Sig Start Date End Date Taking? Authorizing Provider  acetaminophen (TYLENOL) 325 MG tablet Take 650 mg by mouth every 6 (six) hours as needed for mild pain.     Historical Provider, MD  aspirin 81 MG tablet Take 81 mg by mouth daily.    Historical Provider, MD  CARTIA XT 120 MG 24 hr capsule Take 120 mg by mouth daily.  11/25/14   Historical Provider, MD  cholecalciferol (VITAMIN D) 1000 UNITS tablet Take 1,000 Units by mouth every morning.     Historical Provider, MD  HYDROcodone-acetaminophen (NORCO/VICODIN) 5-325 MG per tablet Take 1 tablet by mouth every 6 (six) hours as needed. Patient not taking: Reported on 05/10/2015 04/02/15   Florian Buff, MD  hydrOXYzine (VISTARIL) 25 MG capsule Take 4 capsules (100 mg total) by mouth 3 (three) times daily as needed for itching. 03/18/15   Florian Buff, MD  ketorolac (TORADOL) 10 MG tablet Take 1 tablet (10 mg total) by mouth every 8 (eight) hours as needed. Patient not taking: Reported on 04/09/2015 04/02/15   Florian Buff, MD  Linaclotide [facility]) 145 MCG CAPS capsule 1 PO 30 mins prior to your first meal 05/09/15   Carlis Stable, NP  medroxyPROGESTERone (PROVERA) 10 MG tablet Take 1 tablet (10 mg total) by mouth daily. 04/09/15   Florian Buff, MD  naproxen sodium (ALEVE) 220 MG tablet Take 220 mg by mouth 2 (two) times daily as needed (for pain).     Historical Provider, MD  ondansetron (ZOFRAN) 8 MG tablet Take 1 tablet (8 mg total) by mouth every 8 (eight) hours as needed for nausea. Patient not taking: Reported on 04/09/2015 04/02/15   Florian Buff, MD  simvastatin (ZOCOR) 20 MG tablet Take 20 mg by mouth at bedtime.      Historical Provider, MD  TOVIAZ 4 MG TB24 tablet Take 1 tablet by mouth daily. 05/09/15   Historical Provider, MD  TRADJENTA 5 MG TABS tablet Take 5 mg by mouth daily.  02/26/15   Historical Provider, MD  valACYclovir (VALTREX) 1000 MG tablet Take 1 tablet (1,000 mg total) by mouth 3 (three) times daily. Patient not taking: Reported on 04/09/2015 03/12/15   Florian Buff, MD   Physical Exam: Filed Vitals:   05/10/15 1127 05/10/15 1128 05/10/15 1145 05/10/15 1200  BP: 138/85  173/112 146/76  Pulse: 55  55 72  Temp:  98.3 F (36.8  C)    TempSrc:      Resp: '17  14 24  '$ Height:      Weight:      SpO2: 94%  100% 99%    Wt Readings from Last 3 Encounters:  05/10/15 127.914 kg (282 lb)  04/09/15 127.914 kg (282 lb)  04/02/15 131.09 kg (289 lb)    General:  Appears calm and comfortable, morbidly obese Eyes: PERRL, normal lids, irises & conjunctiva ENT: grossly normal hearing, lips & tongue Neck: no LAD, masses or thyromegaly Cardiovascular: irregular, no m/r/g. No LE edema. Telemetry: a fib Respiratory: CTA bilaterally, no w/r/r. Normal respiratory effort. Abdomen: soft, nt, obese, bs+ Skin: no rash or induration seen on limited exam Musculoskeletal: grossly normal tone BUE/BLE Psychiatric: grossly normal mood and affect, speech fluent and appropriate Neurologic: right upper extremity 5/5, left upper extremity 3/5, lower extremities 5/5 bilaterally, cranial nerves grossly intact          Labs on Admission:  Basic Metabolic Panel:  Recent Labs Lab 05/10/15 1026 05/10/15 1035  NA 141 141  K 5.0 4.9  CL 108 108  CO2 25  --   GLUCOSE 116* 114*  BUN 27* 26*  CREATININE 1.33* 1.30*  CALCIUM 8.7*  --    Liver Function Tests:  Recent Labs Lab 05/10/15 1026  AST 14*  ALT 12*  ALKPHOS 46  BILITOT 0.7  PROT 6.7  ALBUMIN 3.5   No results for input(s): LIPASE, AMYLASE in the last 168 hours. No results for input(s): AMMONIA in the last 168 hours. CBC:  Recent Labs Lab  05/10/15 1026 05/10/15 1035  WBC 4.4  --   NEUTROABS 2.7  --   HGB 12.6 13.3  HCT 38.1 39.0  MCV 100.3*  --   PLT 175  --    Cardiac Enzymes: No results for input(s): CKTOTAL, CKMB, CKMBINDEX, TROPONINI in the last 168 hours.  BNP (last 3 results) No results for input(s): BNP in the last 8760 hours.  ProBNP (last 3 results) No results for input(s): PROBNP in the last 8760 hours.  CBG:  Recent Labs Lab 05/10/15 1009  GLUCAP 106*    Radiological Exams on Admission: Ct Head Wo Contrast  05/10/2015   CLINICAL DATA:  Code stroke, left arm weakness  EXAM: CT HEAD WITHOUT CONTRAST  TECHNIQUE: Contiguous axial images were obtained from the base of the skull through the vertex without intravenous contrast.  COMPARISON:  07/26/2014; 03/31/2013; brain MRI - 07/27/2014  FINDINGS: Examination is degraded due to a combination of patient body habitus as well as motion artifact.  Re- demonstrated extensive nearly confluent periventricular hypodensities compatible with microvascular ischemic disease. Given extensive background parenchymal abnormalities, there is no CT evidence of superimposed acute large territory infarct. No intraparenchymal or extra-axial mass or hemorrhage. Unchanged size a configuration of the ventricles and basilar cisterns. No midline shift. Intracranial atherosclerosis. There is minimal mucosal thickening involving the left frontal and anterior ethmoidal air cells. The remaining paranasal sinuses and mastoid air cells are normally aerated. No air-fluid levels. Post left-sided cataract surgery. Regional soft tissues appear otherwise normal. No displaced calvarial fracture.  IMPRESSION: 1. Degraded examination without definite acute intracranial process. 2. Similar findings of advanced microvascular ischemic disease. Critical Value/emergent results were called by telephone at the time of interpretation on 05/10/2015 at 10:27 am to Dr. Daleen Bo , who verbally acknowledged these  results.   Electronically Signed   By: Sandi Mariscal M.D.   On: 05/10/2015 10:29  EKG: Independently reviewed. Atrial fibrillation  Assessment/Plan Active Problems:   DM (diabetes mellitus), type 2 with complications   OBESITY, MORBID   Essential hypertension, benign   Atrial fibrillation   Hyperlipidemia   Sleep apnea   PMB (postmenopausal bleeding)   Left-sided weakness   CVA (cerebral vascular accident)   1. CVA. Patient presents with left sided weakness. She it out of the window for tPA. CT head did not show any acute findings. She was seen by teleneurology who recommended standard post stroke work up. Unfortunately, we do not have MRI services or neurology services available over the weekend at [facility]. She will need to be transferred to [facility] cone for further care. I have discussed the case with Dr. Nicole Kindred, neurology who will see the patient in consult. She has been started on aspirin for now. Further orders per stroke order set. 2. Atrial fibrillation. Continue on diltiazem for rate control. Will defer initiation of anticoagulation to neurology once it is felt safe from a stroke perspective. 3. Postmenopausal bleeding. Patient recently had a D&C done by her gynecologist. She's had a small amount of vaginal spotting since that time. Case was discussed with Dr. Glo Herring, on call for gynecology. Recommendations are to start the patient on anticoagulation if needed. She may have slightly more bleeding. If bleeding becomes significant, he has recommended official gynecology consultation at [facility]. He is aware of the patient. Will discontinue Provera in the setting of #1. 4. Hypertension. Appears stable. 5. Diabetes. Hold oral agents. Continue on sliding scale insulin  Code Status: full code DVT Prophylaxis: lovenox Family Communication: discussed with patient Disposition Plan: Admit to [facility] for further care  Time spent: 68mns  [facility] Pager [phone]

## 2015-05-10 NOTE — Consult Note (Signed)
Neurology Consultation Reason for Consult: Stroke Referring Physician: Pearletha Forge  CC: Stroke  History is obtained from:patient, family  HPI: [name] is a 74 y.o. female with a history of afib, htn, dm, vaginal bleeding  who presents with left-sided weakness that started 6/3 on awakening. She states that she has been working with it and his been getting slightly better, however was still significant and therefore she went to the emergency room at [facility]. Given that there is no neurology there on the weekend she was transferred to [facility] cone.  She states that she is getting better overall, but did have some left facial numbness earlier today.  Of note, she has been having vaginal bleeding since her procedure and has been off of her anticoagulation because of this. She does have a history of atrial fibrillation. She has been taking aspirin daily.   ROS: A 14 point ROS was performed and is negative except as noted in the HPI.   Past Medical History  Diagnosis Date  . Diabetes mellitus   . Asthma   . Chronic knee pain   . Back pain, chronic   . Hypertension   . Vertigo   . Hyperlipidemia   . TIA (transient ischemic attack)   . Anemia   . HOH (hard of hearing)   . Arthritis   . Obesity   . PMB (postmenopausal bleeding) 11/26/2014  . Urge incontinence 11/26/2014  . Thickened endometrium 01/15/2015  . Dysrhythmia   . Stroke     Mini Stroke    Family History: Grandmother-stroke  Social History: Tob: Denies  Exam: Current vital signs: BP 125/59 mmHg  Pulse 78  Temp(Src) 98.3 F (36.8 C) (Oral)  Resp 21  Ht '4\' 11"'$  (1.499 m)  Wt 127.914 kg (282 lb)  BMI 56.93 kg/m2  SpO2 98%  LMP 03/31/2013 Vital signs in last 24 hours: Temp:  [98.3 F (36.8 C)] 98.3 F (36.8 C) (06/04 1128) Pulse Rate:  [47-112] 78 (06/04 1626) Resp:  [14-27] 21 (06/04 1739) BP: (113-173)/(59-112) 125/59 mmHg (06/04 1739) SpO2:  [92 %-100 %] 98 % (06/04 1626) Weight:  [127.914 kg (282  lb)] 127.914 kg (282 lb) (06/04 1018)  Physical Exam  Constitutional: Appears well-developed and well-nourished.  Psych: Affect appropriate to situation Eyes: No scleral injection HENT: No OP obstrucion Head: Normocephalic.  Cardiovascular: Normal rate and regular rhythm.  Respiratory: Effort normal and breath sounds normal to anterior ascultation GI: Soft.  No distension. There is no tenderness.  Skin: WDI  Neuro: Mental Status: Patient is awake, alert, oriented to person, place, month, year, and situation. Patient is able to give a clear and coherent history. No signs of aphasia or neglect Cranial Nerves: II: Visual Fields are full. Pupils are equal, round, and reactive to light.   III,IV, VI: EOMI without ptosis or diploplia.  V: Facial sensation is symmetric to temperature VII: Facial movement is symmetric.  VIII: hearing is intact to voice X: Uvula elevates symmetrically XI: Shoulder shrug is symmetric. XII: tongue is midline without atrophy or fasciculations.  Motor: Tone is normal. Bulk is normal. 5/5 strength was present on the right side, on the left side she has 3/5 weakness of the left arm and 4/5 weakness of the left leg Sensory: Sensation is diminished on left Cerebellar: She is unable to perform on the left, on the right her finger nose finger is intact   I have reviewed labs in epic and the results pertinent to this consultation are: CMP-mildly  elevated creatinine  I have reviewed the images obtained: MRI brain-strokes in the distribution of her right MCA  Impression: 74 year old female with strokes while asleep in the setting of MCA stenosis. I do wonder if she had hypotension resulting in hyperperfusion deficit versus artery to artery embolus versus embolus from atrial fibrillation. She will need to resume her anticoagulation, but do not feel that she needs this in the setting of her acute stroke at the current time. She will need to have her vaginal bleeding  addressed as well before resuming this. She has multiple other stroke risk factors and these can be optimized as well.  Recommendations: 1. HgbA1c, fasting lipid panel 2. MRI, MRA  of the brain without contrast 3. Frequent neuro checks 4. Echocardiogram 5. Carotid dopplers 6. Prophylactic therapy-Antiplatelet med: Aspirin - dose '325mg'$  PO or '300mg'$  PR 7. Risk factor modification 8. Telemetry monitoring 9. PT consult, OT consult, Speech consult    Roland Rack, MD [facility] [phone]  If 7pm- 7am, please page neurology on call as listed in [place].

## 2015-05-11 DIAGNOSIS — N950 Postmenopausal bleeding: Secondary | ICD-10-CM

## 2015-05-11 DIAGNOSIS — I480 Paroxysmal atrial fibrillation: Secondary | ICD-10-CM | POA: Insufficient documentation

## 2015-05-11 DIAGNOSIS — I639 Cerebral infarction, unspecified: Secondary | ICD-10-CM | POA: Insufficient documentation

## 2015-05-11 DIAGNOSIS — E785 Hyperlipidemia, unspecified: Secondary | ICD-10-CM

## 2015-05-11 LAB — GLUCOSE, CAPILLARY
Glucose-Capillary: 75 mg/dL (ref 65–99)
Glucose-Capillary: 93 mg/dL (ref 65–99)
Glucose-Capillary: 118 mg/dL — ABNORMAL HIGH (ref 65–99)
Glucose-Capillary: 87 mg/dL (ref 65–99)

## 2015-05-11 LAB — URINE MICROSCOPIC-ADD ON

## 2015-05-11 LAB — URINALYSIS, ROUTINE W REFLEX MICROSCOPIC
Glucose, UA: NEGATIVE mg/dL
Ketones, ur: 15 mg/dL — AB
Nitrite: NEGATIVE
Protein, ur: 100 mg/dL — AB
Specific Gravity, Urine: 1.035 — ABNORMAL HIGH (ref 1.005–1.030)
Urobilinogen, UA: 1 mg/dL (ref 0.0–1.0)
pH: 5 (ref 5.0–8.0)

## 2015-05-11 LAB — RAPID URINE DRUG SCREEN, HOSP PERFORMED
Amphetamines: NOT DETECTED
Barbiturates: NOT DETECTED
Benzodiazepines: NOT DETECTED
Cocaine: NOT DETECTED
Opiates: NOT DETECTED
Tetrahydrocannabinol: NOT DETECTED

## 2015-05-11 LAB — CBC
HCT: 35.3 % — ABNORMAL LOW (ref 36.0–46.0)
Hemoglobin: 11.5 g/dL — ABNORMAL LOW (ref 12.0–15.0)
MCH: 32.2 pg (ref 26.0–34.0)
MCHC: 32.6 g/dL (ref 30.0–36.0)
MCV: 98.9 fL (ref 78.0–100.0)
Platelets: 184 10*3/uL (ref 150–400)
RBC: 3.57 MIL/uL — ABNORMAL LOW (ref 3.87–5.11)
RDW: 13.5 % (ref 11.5–15.5)
WBC: 3.8 10*3/uL — ABNORMAL LOW (ref 4.0–10.5)

## 2015-05-11 LAB — LIPID PANEL
Cholesterol: 207 mg/dL — ABNORMAL HIGH (ref 0–200)
HDL: 44 mg/dL (ref >40)
LDL Cholesterol: 148 mg/dL — ABNORMAL HIGH (ref 0–99)
Total CHOL/HDL Ratio: 4.7 RATIO
Triglycerides: 75 mg/dL (ref <150)
VLDL: 15 mg/dL (ref 0–40)

## 2015-05-11 MED ORDER — ATORVASTATIN CALCIUM 20 MG PO TABS
20.0000 mg | ORAL_TABLET | Freq: Every day | ORAL | Status: DC
  Administered 2015-05-11 – 2015-05-14 (×4): 20 mg via ORAL
  Filled 2015-05-11 (×4): qty 1

## 2015-05-11 MED ORDER — SIMVASTATIN 40 MG PO TABS: 40.0000 mg | ORAL_TABLET | Freq: Every day | ORAL | Status: DC

## 2015-05-11 MED ORDER — MEGESTROL ACETATE 40 MG PO TABS
40.0000 mg | ORAL_TABLET | Freq: Every day | ORAL | Status: DC
  Administered 2015-05-11: 40 mg via ORAL
  Filled 2015-05-11 (×2): qty 1

## 2015-05-11 MED ORDER — SODIUM CHLORIDE 0.9 % IV SOLN
INTRAVENOUS | Status: AC
  Administered 2015-05-11 – 2015-05-12 (×2): via INTRAVENOUS

## 2015-05-11 MED ORDER — SODIUM CHLORIDE 0.9 % IV SOLN: Freq: Once | INTRAVENOUS | Status: AC

## 2015-05-11 NOTE — Progress Notes (Signed)
STROKE TEAM PROGRESS NOTE   HISTORY [name] is a 74 y.o. female with a history of afib, htn, dm, and vaginal bleeding who presents with left-sided weakness that started 6/3 on awakening. She states that she has been working with it and his been getting slightly better, however was still significant and therefore she went to the emergency room at [facility]. Given that there is no neurology there on the weekend she was transferred to [facility].  She states that she is getting better overall, but did have some left facial numbness earlier today.  Of note, she has been having vaginal bleeding since her procedure and has been off of her anticoagulation because of this. She does have a history of atrial fibrillation. She has been taking aspirin daily.   SUBJECTIVE (INTERVAL HISTORY) The patient's son was at the bedside. The patient has minimal movement of the left upper extremity with decreased sensation. Dr. Erlinda Hong discussed the possible need for full anticoagulation. He recommended that the patient consider a hysterectomy in order to prevent further vaginal bleeding. The patient agrees with this plan. She will speak to her OB/GYN physician. She is also anxious to start rehabilitation in order to gain maximal recovery from her stroke.  OBJECTIVE Temp:  [98.1 F (36.7 C)-98.9 F (37.2 C)] 98.1 F (36.7 C) (06/05 0552) Pulse Rate:  [53-99] 74 (06/04 2154) Cardiac Rhythm:  [-]  Resp:  [16-27] 24 (06/04 2154) BP: (113-163)/(53-90) 151/81 mmHg (06/05 0948) SpO2:  [92 %-100 %] 98 % (06/04 2154) Weight:  [131.4 kg (289 lb 11 oz)] 131.4 kg (289 lb 11 oz) (06/05 0400)   Recent Labs Lab 05/10/15 1009 05/10/15 1804 05/10/15 2149 05/11/15 0727 05/11/15 1123  GLUCAP 106* 85 117* 75 93    Recent Labs Lab 05/10/15 1026 05/10/15 1035 05/10/15 1841  NA 141 141  --   K 5.0 4.9  --   CL 108 108  --   CO2 25  --   --   GLUCOSE 116* 114*  --   BUN 27* 26*  --   CREATININE 1.33* 1.30*  1.17*  CALCIUM 8.7*  --   --     Recent Labs Lab 05/10/15 1026  AST 14*  ALT 12*  ALKPHOS 46  BILITOT 0.7  PROT 6.7  ALBUMIN 3.5    Recent Labs Lab 05/10/15 1026 05/10/15 1035 05/10/15 1841  WBC 4.4  --  4.6  NEUTROABS 2.7  --   --   HGB 12.6 13.3 11.9*  HCT 38.1 39.0 36.7  MCV 100.3*  --  98.7  PLT 175  --  184   No results for input(s): CKTOTAL, CKMB, CKMBINDEX, TROPONINI in the last 168 hours.  Recent Labs  05/10/15 1026  LABPROT 13.2  INR 0.98    Recent Labs  05/11/15 0106  COLORURINE RED*  LABSPEC 1.035*  PHURINE 5.0  GLUCOSEU NEGATIVE  HGBUR LARGE*  BILIRUBINUR MODERATE*  KETONESUR 15*  PROTEINUR 100*  UROBILINOGEN 1.0  NITRITE NEGATIVE  LEUKOCYTESUR SMALL*       Component Value Date/Time   CHOL 207* 05/11/2015 0253   TRIG 75 05/11/2015 0253   HDL 44 05/11/2015 0253   CHOLHDL 4.7 05/11/2015 0253   VLDL 15 05/11/2015 0253   LDLCALC 148* 05/11/2015 0253   Lab Results  Component Value Date   HGBA1C 6.2* 07/26/2014      Component Value Date/Time   LABOPIA NONE DETECTED 05/11/2015 0106   COCAINSCRNUR NONE DETECTED 05/11/2015 0106  LABBENZ NONE DETECTED 05/11/2015 0106   AMPHETMU NONE DETECTED 05/11/2015 0106   THCU NONE DETECTED 05/11/2015 0106   LABBARB NONE DETECTED 05/11/2015 0106     Recent Labs Lab 05/10/15 [place] <5   I have personally reviewed the radiological images below and agree with the radiology interpretations.  Dg Chest 2 View 05/10/2015    No active cardiopulmonary disease.    Ct Head Wo Contrast 05/10/2015    1. Degraded examination without definite acute intracranial process.  2. Similar findings of advanced microvascular ischemic disease.   Mr Brain Wo Contrast 05/10/2015    MR BRAIN:  Acute nonhemorrhagic infarct right caudate. Acute nonhemorrhagic infarcts in a parasagittal distribution within the right frontal lobe and anterior aspect of the right parietal lobe.  Remote anterior right frontal lobe/genu  of the corpus callosum infarct. Remote small basal ganglia infarcts bilaterally.  Moderate small vessel disease type changes.  Partial opacification mastoid air cells greater on right. Paranasal sinus mucosal thickening.  Expanded partially empty sella. This finding and slightly prominent peri optic spaces can be seen in this setting of pseudotumor cerebri. No flattening of the posterior aspect of the globes.    MRA HEAD  High-grade stenosis of proximal right middle cerebral artery branches just beyond the bifurcation. Decrease number of visualized right middle cerebral artery branches consistent with patient's acute infarct.  Please see above for additional findings.     2-D echo - pending  Carotid Doppler - pending  PHYSICAL EXAM  Temp:  [98.1 F (36.7 C)-98.9 F (37.2 C)] 98.1 F (36.7 C) (06/05 0552) Pulse Rate:  [68-84] 74 (06/04 2154) Resp:  [16-27] 24 (06/04 2154) BP: (120-163)/(53-90) 151/81 mmHg (06/05 0948) SpO2:  [92 %-98 %] 98 % (06/04 2154) Weight:  [289 lb 11 oz (131.4 kg)] 289 lb 11 oz (131.4 kg) (06/05 0400)  General - morbid obesity, well developed, in no apparent distress.  Ophthalmologic - Fundi not visualized due to eye movement.  Cardiovascular - irregularly irregular heart rate and rhythm.  Mental Status -  Level of arousal and orientation to time, place, and person were intact. Language including expression, naming, repetition, comprehension was assessed and found intact.  Cranial Nerves II - XII - II - Visual field intact OU. III, IV, VI - Extraocular movements intact. V - Facial sensation intact bilaterally. VII - Facial movement intact bilaterally. VIII - Hearing & vestibular intact bilaterally. X - Palate elevates symmetrically. XI - Chin turning & shoulder shrug intact bilaterally. XII - Tongue protrusion intact.  Motor Strength - The patient's strength was LUE 0/5 proximal and 2/5 distal, LLE 5/5, and RUE and RLE 5/5.Marland Kitchen  Bulk was normal and  fasciculations were absent.   Motor Tone - Muscle tone was assessed at the neck and appendages and was normal.  Reflexes - The patient's reflexes were 1+ in all extremities and she had no pathological reflexes.  Sensory - Light touch, temperature/pinprick were assessed and were decreased on the left.    Coordination - The patient had normal movements in the right hand with no ataxia or dysmetria.  Tremor was absent.  Gait and Station - not tested due to safety concerns.   ASSESSMENT/PLAN Ms. KELYSE PASK is a 74 y.o. female with history of atrial fibrillation on aspirin 81 mg daily prior to admission, hypertension, a previous TIA and stroke, hyperlipidemia, diabetes mellitus, and vaginal bleeding presenting with left arm weakness.  She did not receive IV t-PA due to vaginal bleeding history.  Stroke:  Non- Dominant right caudate and right MCA infarcts felt to be embolic secondary to atrial fibrillation. Patient also had right ACA stroke in 07/2014, put on Eliquis which was discontinued due to vaginal bleeding.  Resultant  left upper extremity plegia  MRI - as above.   MRA - high-grade stenosis of the proximal right middle cerebral artery branches.  Carotid Doppler  pending   2D Echo  pending  LDL 148, not at goal  HgbA1c pending  Lovenox for VTE prophylaxis Diet Carb Modified Fluid consistency:: Thin; Room service appropriate?: Yes  aspirin 81 mg orally every day prior to admission, now on aspirin 325 mg orally every day  Patient counseled to be compliant with her antithrombotic medications  Ongoing aggressive stroke risk factor management  Therapy recommendations: Pending   Disposition:  Pending  Atrial fibrillation  Likely the cause for recurrent stroke  Was on eliquis, however, discontinued due to vaginal bleeding   For stroke prevention, anticoagulation is needed   Due to vaginal bleeding, recommend hysterectomy in order to restart anticoagulation for stroke  prevention  History of stroke  07/2014 right ACA stroke  Was on Coumadin prior, discontinued due to vaginal bleeding  Put on eliquis at the time  However Eliquis was also discontinued due to vaginal bleeding  Hypertension  Home meds: Cartia  Stable  Hyperlipidemia  Home meds:  Zocor 20 mg daily resumed in hospital  LDL 148, goal < 70  Increase Zocor to 40 mg daily   Continue statin at discharge  Diabetes  HgbA1c pending, goal < 7.0  Controlled  Other Stroke Risk Factors  Advanced age  Cigarette smoker, quit smoking.  Obesity, Body mass index is 58.48 kg/(m^2).   Hx stroke/TIA  Family hx stroke (grandmother)  Other Active Problems   Renal insufficiency    Vaginal bleeding s/p D&C, still has bleeding on admission   High-grade stenosis proximal right middle cerebral artery  Other Pertinent History    Hospital day # [place] PA-C [facility] Pager [phone] 05/11/2015, 11:55 AM  I, the attending vascular neurologist, have personally obtained a history, examined the patient, evaluated laboratory data, individually viewed imaging studies and agree with radiology interpretations. I obtained additional history from pt's son at bedside. I also discussed with patient and Dr. Algis Liming regarding his care plan. Together with the NP/PA, we formulated the assessment and plan of care which reflects our mutual decision.  I have made any additions or clarifications directly to the above note and agree with the findings and plan as currently documented.   74 year old female with history of A. fib not on AC due to vaginal bleeding, stroke in 07/2014 hypertension, diabetes, obesity was admitted for right MCA infarct, likely due to A. fib not on AC. Patient was on Coumadin before, discontinued due to vaginal bleeding, put on Eliquis, but again discontinued due to vaginal bleeding. Due to recurrent stroke and A. Fib not on AC due to vaginal bleeding,  again recommend hysterectomy to cure vaginal bleeding and then consider anticoagulation. Patient may have to arrange outpatient follow-up with her OBGYN to perform the procedure. Anticoagulation can be started after procedure. At this time, continue aspirin. Continue monitoring CBC.   Rosalin Hawking, MD PhD Stroke Neurology 05/11/2015 2:05 PM   To contact Stroke Continuity provider, please refer to [email or web address] After hours, contact General Neurology

## 2015-05-11 NOTE — Progress Notes (Signed)
PROGRESS NOTE    [name] GEZ:[id number] DOB: 03/27/1941 DOA: 05/10/2015 PCP: Rosita Fire, MD  HPI/Brief narrative 74 y.o. female with a history of afib, htn, dm, vaginal bleeding who presented to [facility] with left-sided weakness that started 6/3 at 4 AM. She states that she has been working with it and his been getting slightly better, however was still significant and therefore she went to the emergency room at [facility]. Given that there is no neurology there on the weekend she was transferred to [facility] cone. Patient has been off Eliquis a few months due to bleeding issues. She is s/p hysteroscopy and uterine curettage by Dr. Tania Ade on 04/02/15 for simple and complex hyperplasia without atypia. Postprocedure she had spotting and was placed on Provera but continues to have spotting or even mild bleeding. She states she changes 3 pull-ups daily. Evaluation at [facility] reveals right MCA/ACA territory infarcts. Neurology/stroke team consulting.   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 fib in the absence of anticoagulation due to vaginal bleed and MCA branch artery occlusions - Was on aspirin 81 MG PTA. Now on aspirin 325 MG daily for secondary stroke prophylaxis. - Ideally needs to be on anticoagulation but held until safe to start from a vaginal bleeding perspective. Anticoagulation to start after definitive treatment for vaginal bleeding. - Not candidate for TPA due to vaginal bleeding. - MRI brain: Acute right nonhemorrhagic infarcts. MRA brain: High-grade stenosis of the proximal right MCA branches - Carotid Doppler: Pending  - 2-D echo: Pending - LDL 148: On simvastatin -  A1c: Pending - ST evaluation: No further needs - PT and OT evaluation: Pending - Discussed with Dr. Austin Miles M.D.   Paroxysmal A. fib  - Now in sinus rhythm  - Continue Cardizem  - CHA2DS2-VASc Score: 6 - Not on  anticoagulation secondary to vaginal bleeding    Postmenopausal Vaginal bleeding - s/p hysteroscopy and uterine curettage by Dr. Tania Ade on 04/02/15 for simple and complex hyperplasia without atypia.  - Was on Provera 10 MG daily postprocedure for spotting but continues to have spotting/mild vaginal bleeding - Discussed with on call GYN M.D. Dr. Roselie Awkward who recommended changing Provera to Megace 40 mg daily and follow up with primary GYN M.D. regarding definitive treatment. - Will attempt to discuss with her primary GYN M.D. 6/6.  Essential hypertension - Controlled  Type II DM - Oral hypoglycemics held. - Continue SSI - Controlled  Mild anemia - Follow CBCs in the context of vaginal bleeding.  Morbid obesity/Body mass index is 58.48 kg/(m^2).  Hyperlipidemia - Increase simvastatin from 20 mg to 40 mg daily  Mild acute on stage II chronic kidney disease - Creatinine improved from 1.3 > 1.17 - Follow BMP in a.m.  DVT prophylaxis: SCDs  Code Status: Full Family Communication: None at bedside  Disposition Plan: To be determined    Consultants:  Neurology/stroke service  Procedures:  None  Antibiotics:  None   Subjective: Intermittent tingling of left side of face. Continued weakness of left upper extremity >left lower extremity. As per nursing, vaginal bleeding +  Objective: Filed Vitals:   05/11/15 0001 05/11/15 0200 05/11/15 0400 05/11/15 0552  BP: 125/62 137/70 120/53 132/64  Pulse:      Temp: 98.3 F (36.8 C) 98.3 F (36.8 C) 98.9 F (37.2 C) 98.1 F (36.7 C)  TempSrc: Oral Oral Oral Oral  Resp:      Height:  Weight:   131.4 kg (289 lb 11 oz)   SpO2:       No intake or output data in the 24 hours ending 05/11/15 0737 Filed Weights   05/10/15 1018 05/11/15 0400  Weight: 127.914 kg (282 lb) 131.4 kg (289 lb 11 oz)     Exam:  General exam: Morbidly obese female lying comfortably in bed  Respiratory system: Clear. No increased work of  breathing. Cardiovascular system: S1 & S2 heard, RRR. No JVD, murmurs, gallops, clicks or pedal edema. Telemetry: Presented with A. fib but currently in sinus rhythm  Gastrointestinal system: Abdomen is nondistended, soft and nontender. Normal bowel sounds heard. Central nervous system: Alert and oriented. dysarthria +.? Facial asymmetry.  Extremities: 5 x 5 power in right extremities and LLE. Grade 0 x 5 power distally in left upper extremity and 2 x 5 power proximally.    Data Reviewed: Basic Metabolic Panel:  Recent Labs Lab 05/10/15 1026 05/10/15 1035 05/10/15 1841  NA 141 141  --   K 5.0 4.9  --   CL 108 108  --   CO2 25  --   --   GLUCOSE 116* 114*  --   BUN 27* 26*  --   CREATININE 1.33* 1.30* 1.17*  CALCIUM 8.7*  --   --    Liver Function Tests:  Recent Labs Lab 05/10/15 1026  AST 14*  ALT 12*  ALKPHOS 46  BILITOT 0.7  PROT 6.7  ALBUMIN 3.5   No results for input(s): LIPASE, AMYLASE in the last 168 hours. No results for input(s): AMMONIA in the last 168 hours. CBC:  Recent Labs Lab 05/10/15 1026 05/10/15 1035 05/10/15 1841  WBC 4.4  --  4.6  NEUTROABS 2.7  --   --   HGB 12.6 13.3 11.9*  HCT 38.1 39.0 36.7  MCV 100.3*  --  98.7  PLT 175  --  184   Cardiac Enzymes: No results for input(s): CKTOTAL, CKMB, CKMBINDEX, TROPONINI in the last 168 hours. BNP (last 3 results) No results for input(s): PROBNP in the last 8760 hours. CBG:  Recent Labs Lab 05/10/15 1009 05/10/15 1804 05/10/15 2149  GLUCAP 106* 85 117*    Recent Results (from the past 240 hour(s))  MRSA PCR Screening     Status: None   Collection Time: 05/10/15  6:30 PM  Result Value Ref Range Status   MRSA by PCR NEGATIVE NEGATIVE Final    Comment:        The GeneXpert MRSA Assay (FDA approved for NASAL specimens only), is one component of a comprehensive MRSA colonization surveillance program. It is not intended to diagnose MRSA infection nor to guide or monitor treatment  for MRSA infections.        Studies: Dg Chest 2 View  05/10/2015   CLINICAL DATA:  Acute stroke.  Left arm and leg weakness.  EXAM: CHEST  2 VIEW  COMPARISON:  03/31/2013 and 12/15/2011  FINDINGS: Heart size and pulmonary vascularity are normal and the lungs are clear. No acute osseous abnormality. Flowing osteophytes fuse much of the thoracic spine.  IMPRESSION: No active cardiopulmonary disease.   Electronically Signed   By: Lorriane Shire M.D.   On: 05/10/2015 21:37   Ct Head Wo Contrast  05/10/2015   CLINICAL DATA:  Code stroke, left arm weakness  EXAM: CT HEAD WITHOUT CONTRAST  TECHNIQUE: Contiguous axial images were obtained from the base of the skull through the vertex without intravenous contrast.  COMPARISON:  07/26/2014; 03/31/2013; brain MRI - 07/27/2014  FINDINGS: Examination is degraded due to a combination of patient body habitus as well as motion artifact.  Re- demonstrated extensive nearly confluent periventricular hypodensities compatible with microvascular ischemic disease. Given extensive background parenchymal abnormalities, there is no CT evidence of superimposed acute large territory infarct. No intraparenchymal or extra-axial mass or hemorrhage. Unchanged size a configuration of the ventricles and basilar cisterns. No midline shift. Intracranial atherosclerosis. There is minimal mucosal thickening involving the left frontal and anterior ethmoidal air cells. The remaining paranasal sinuses and mastoid air cells are normally aerated. No air-fluid levels. Post left-sided cataract surgery. Regional soft tissues appear otherwise normal. No displaced calvarial fracture.  IMPRESSION: 1. Degraded examination without definite acute intracranial process. 2. Similar findings of advanced microvascular ischemic disease. Critical Value/emergent results were called by telephone at the time of interpretation on 05/10/2015 at 10:27 am to Dr. Daleen Bo , who verbally acknowledged these results.    Electronically Signed   By: Sandi Mariscal M.D.   On: 05/10/2015 10:29   Mr Brain Wo Contrast  05/10/2015   CLINICAL DATA:  74 year old diabetic hypertensive female with hyperlipidemia and atrial fibrillation presenting with left arm and leg weakness. Subsequent encounter.  EXAM: MRI HEAD WITHOUT CONTRAST  MRA HEAD WITHOUT CONTRAST  TECHNIQUE: Multiplanar, multiecho pulse sequences of the brain and surrounding structures were obtained without intravenous contrast. Angiographic images of the head were obtained using MRA technique without contrast.  COMPARISON:  05/10/2015 head CT.  07/27/2014 MR.  FINDINGS: MR BRAIN:  Acute nonhemorrhagic infarct right caudate. Acute nonhemorrhagic infarcts in a parasagittal distribution within the right frontal lobe and anterior aspect of the right parietal lobe.  Remote anterior right frontal lobe/genu of the corpus callosum infarct with encephalomalacia and minimal amount of blood breakdown products in the adjacent right genu of the corpus callosum. Remote small basal ganglia infarcts bilaterally.  Moderate small vessel disease type changes.  Mild atrophy without hydrocephalus.  No intracranial mass lesion noted on this unenhanced exam.  Partial opacification mastoid air cells greater on right. No obstructing lesion of the eustachian tube noted. Paranasal sinus mucosal thickening.  Post lens replacement otherwise orbital structures unremarkable.  Expanded partially empty sella. This finding and slightly prominent peri optic spaces can be seen in this setting of pseudotumor cerebri. No flattening of the posterior aspect of the globes.  Cervical medullary junction and pineal region unremarkable.  MRA HEAD FINDINGS  Ectatic right internal carotid artery distal cervical segment.  Slight irregularity and ectasia of the cavernous segment of the internal carotid artery bilaterally.  High-grade stenosis of proximal right middle cerebral artery branches just beyond the bifurcation. Decrease  number of visualized right middle cerebral artery branches consistent with patient's acute infarct.  High-grade stenosis distal A1 segment right anterior cerebral artery. Markedly narrowed and irregular A2 segment anterior cerebral artery more notable on the right.  Fetal type contribution to the left posterior cerebral artery.  Mild to moderate narrowing left middle cerebral artery branch vessels.  No significant stenosis of the distal vertebral arteries or basilar artery.  Nonvisualized posterior inferior cerebellar arteries.  Moderate narrowing proximal left anterior inferior cerebellar artery.  Ectatic proximal aspect of the left superior cerebellar artery without saccular aneurysm.  Mild prominence distal basilar artery without saccular aneurysm.  Posterior cerebral artery mild moderate distal branch vessel irregularity.  IMPRESSION: MR BRAIN:  Acute nonhemorrhagic infarct right caudate. Acute nonhemorrhagic infarcts in a parasagittal distribution within the right frontal lobe and anterior aspect  of the right parietal lobe.  Remote anterior right frontal lobe/genu of the corpus callosum infarct. Remote small basal ganglia infarcts bilaterally.  Moderate small vessel disease type changes.  Partial opacification mastoid air cells greater on right. Paranasal sinus mucosal thickening.  Expanded partially empty sella. This finding and slightly prominent peri optic spaces can be seen in this setting of pseudotumor cerebri. No flattening of the posterior aspect of the globes.  MRA HEAD FINDINGS  High-grade stenosis of proximal right middle cerebral artery branches just beyond the bifurcation. Decrease number of visualized right middle cerebral artery branches consistent with patient's acute infarct.  Please see above for additional findings.   Electronically Signed   By: Genia Del M.D.   On: 05/10/2015 21:22   Mr Jodene Nam Head/brain Wo Cm  05/10/2015   CLINICAL DATA:  74 year old diabetic hypertensive female with  hyperlipidemia and atrial fibrillation presenting with left arm and leg weakness. Subsequent encounter.  EXAM: MRI HEAD WITHOUT CONTRAST  MRA HEAD WITHOUT CONTRAST  TECHNIQUE: Multiplanar, multiecho pulse sequences of the brain and surrounding structures were obtained without intravenous contrast. Angiographic images of the head were obtained using MRA technique without contrast.  COMPARISON:  05/10/2015 head CT.  07/27/2014 MR.  FINDINGS: MR BRAIN:  Acute nonhemorrhagic infarct right caudate. Acute nonhemorrhagic infarcts in a parasagittal distribution within the right frontal lobe and anterior aspect of the right parietal lobe.  Remote anterior right frontal lobe/genu of the corpus callosum infarct with encephalomalacia and minimal amount of blood breakdown products in the adjacent right genu of the corpus callosum. Remote small basal ganglia infarcts bilaterally.  Moderate small vessel disease type changes.  Mild atrophy without hydrocephalus.  No intracranial mass lesion noted on this unenhanced exam.  Partial opacification mastoid air cells greater on right. No obstructing lesion of the eustachian tube noted. Paranasal sinus mucosal thickening.  Post lens replacement otherwise orbital structures unremarkable.  Expanded partially empty sella. This finding and slightly prominent peri optic spaces can be seen in this setting of pseudotumor cerebri. No flattening of the posterior aspect of the globes.  Cervical medullary junction and pineal region unremarkable.  MRA HEAD FINDINGS  Ectatic right internal carotid artery distal cervical segment.  Slight irregularity and ectasia of the cavernous segment of the internal carotid artery bilaterally.  High-grade stenosis of proximal right middle cerebral artery branches just beyond the bifurcation. Decrease number of visualized right middle cerebral artery branches consistent with patient's acute infarct.  High-grade stenosis distal A1 segment right anterior cerebral  artery. Markedly narrowed and irregular A2 segment anterior cerebral artery more notable on the right.  Fetal type contribution to the left posterior cerebral artery.  Mild to moderate narrowing left middle cerebral artery branch vessels.  No significant stenosis of the distal vertebral arteries or basilar artery.  Nonvisualized posterior inferior cerebellar arteries.  Moderate narrowing proximal left anterior inferior cerebellar artery.  Ectatic proximal aspect of the left superior cerebellar artery without saccular aneurysm.  Mild prominence distal basilar artery without saccular aneurysm.  Posterior cerebral artery mild moderate distal branch vessel irregularity.  IMPRESSION: MR BRAIN:  Acute nonhemorrhagic infarct right caudate. Acute nonhemorrhagic infarcts in a parasagittal distribution within the right frontal lobe and anterior aspect of the right parietal lobe.  Remote anterior right frontal lobe/genu of the corpus callosum infarct. Remote small basal ganglia infarcts bilaterally.  Moderate small vessel disease type changes.  Partial opacification mastoid air cells greater on right. Paranasal sinus mucosal thickening.  Expanded partially empty sella. This finding  and slightly prominent peri optic spaces can be seen in this setting of pseudotumor cerebri. No flattening of the posterior aspect of the globes.  MRA HEAD FINDINGS  High-grade stenosis of proximal right middle cerebral artery branches just beyond the bifurcation. Decrease number of visualized right middle cerebral artery branches consistent with patient's acute infarct.  Please see above for additional findings.   Electronically Signed   By: Genia Del M.D.   On: 05/10/2015 21:22        Scheduled Meds: . aspirin  300 mg Rectal Daily   Or  . aspirin  325 mg Oral Daily  . diltiazem  120 mg Oral Daily  . enoxaparin (LOVENOX) injection  40 mg Subcutaneous Q24H  . fesoterodine  4 mg Oral Daily  . insulin aspart  0-15 Units Subcutaneous  TID WC  . insulin aspart  0-5 Units Subcutaneous QHS  . Linaclotide  145 mcg Oral Daily  . simvastatin  20 mg Oral QHS   Continuous Infusions: . sodium chloride 50 mL/hr at 05/10/15 2302    Active Problems:   DM (diabetes mellitus), type 2 with complications   OBESITY, MORBID   Essential hypertension, benign   Atrial fibrillation   Hyperlipidemia   Sleep apnea   PMB (postmenopausal bleeding)   Left-sided weakness   CVA (cerebral vascular accident)    Time spent: 89 minutes    Willena Jeancharles, MD, FACP, FHM. [facility] Pager [phone]  If 7PM-7AM, please contact night-coverage [email or web address] Password [email or web address] 05/11/2015, 7:37 AM    LOS: 1 day

## 2015-05-11 NOTE — Progress Notes (Signed)
71, pt family member called nursing station and stated pt was unable to speak on telephone with her.  Upon nursing assessment, pt back to baseline - unable to raise left arm, otherwise normal exam.  Pt speech clear and states tingling in left cheek is absent.  Dr. Leonel Ramsay notified with orders for 500 cc bolus and EEG.  Pt currently receiving bolus with no complaints.  Will continue to monitor.

## 2015-05-11 NOTE — Evaluation (Signed)
Speech Language Pathology Evaluation Patient Details Name: [name] MRN: [id number] DOB: March 28, 1941 Today's Date: 05/11/2015 Time: 9518-8416 SLP Time Calculation (min) (ACUTE ONLY): 18 min  HPI:  [name] is a 74 y.o. female with multiple medical problems including atrial fibrillation, HTN, diabetes and recent vaginal bleeding. She presents to the hospital today with complaints of left arm and leg weakness. She reports onset of symptoms occuring at 4am on 6/3. She initially noted these symptoms with difficulty walking to the bathroom. She then felt weak in her arm and felt that she may have slept on her left arm causing weakness. Her symptoms persisted and today she felt the weakness in her arm and legs were worse. She denies any dysphagia, blurry/double vision, headache, chest pain or shortness of breath. She was evaluated in the ED as a code stroke, but when evaluated by tele neurology, it was felt her symptoms were improving and that she was already out of the window for tPA.   Of note, she underwent endometrial dilation and curettage for endometrial hyperplasia on 04/02/15. Since that time she has had spotting of vaginal blood. She followed up with her gynecologist on 04/09/15 and was started on provera to help control bleeding. She was previously taking eliquis for atrial fibrillation, but has not taken any anticoagulation in a few months due to issues with bleeding.  MRI of the brain showed the following:  Acute nonhemorrhagic infarct right caudate. Acute nonhemorrhagic infarcts in a parasagittal distribution within the right frontal lobe and anterior aspect of the right parietal lobe as well as several remote infarcts.     Assessment / Plan / Recommendation Clinical Impression  The patient's cognitive skills were assessed.  The patient scored a 25/30 on the Mini Mental indicating cognitive skills that are within functional limits.  The patient' cognition, language and motor speech appear  to be within functional limits.  Acute ST follow up is not indicated.      SLP Assessment  Patient does not need any further Speech Lanaguage Pathology Services    Follow Up Recommendations  None       Pertinent Vitals/Pain Pain Assessment: No/denies pain   SLP Goals  Patient/Family Stated Goal: None  SLP Evaluation Prior Functioning  Cognitive/Linguistic Baseline: Within functional limits Type of Home: House Available Help at Discharge: Family;Available 24 hours/day   Cognition  Overall Cognitive Status: Within Functional Limits for tasks assessed Arousal/Alertness: Awake/alert Orientation Level: Oriented X4 Attention: Sustained Sustained Attention: Appears intact Memory: Appears intact Safety/Judgment: Appears intact    Comprehension  Auditory Comprehension Overall Auditory Comprehension: Appears within functional limits for tasks assessed Yes/No Questions: Within Functional Limits Commands: Within Functional Limits Conversation: Complex Reading Comprehension Reading Status: Within funtional limits    Expression Expression Primary Mode of Expression: Verbal Verbal Expression Overall Verbal Expression: Appears within functional limits for tasks assessed Initiation: No impairment Automatic Speech: Name;Social Response Level of Generative/Spontaneous Verbalization: Conversation Repetition: No impairment Naming: No impairment Pragmatics: No impairment Non-Verbal Means of Communication: Not applicable Written Expression Dominant Hand: Right Written Expression: Within Functional Limits   Oral / Motor Oral Motor/Sensory Function Overall Oral Motor/Sensory Function: Appears within functional limits for tasks assessed Motor Speech Overall Motor Speech: Appears within functional limits for tasks assessed Respiration: Within functional limits Phonation: Normal Resonance: Within functional limits Articulation: Within functional limitis Intelligibility:  Intelligible Motor Planning: Witnin functional limits Motor Speech Errors: Not applicable   GO     Lamar Sprinkles 05/11/2015, 9:35 AM Shelly Flatten,  [place], [facility] SLP [phone]

## 2015-05-11 NOTE — Progress Notes (Signed)
Called regarding transient dysphasia and left facial tingling. She is currently back to baseline. She has been having recurrent episodes of identical symptoms since this started.  Possibilities include hypoperfusion due to her MCA stenosis, waxing and waning infarct deficits, or possibly even complex partial seizure. She is not a candidate for tPA at this time given recent infarct.   I advised a 500cc NS bolus and will order EEG for the AM.   Roland Rack, MD [facility] [phone]  If 7pm- 7am, please page neurology on call as listed in [place].

## 2015-05-12 ENCOUNTER — Inpatient Hospital Stay (HOSPITAL_COMMUNITY): Payer: Commercial Managed Care - HMO

## 2015-05-12 DIAGNOSIS — M6289 Other specified disorders of muscle: Secondary | ICD-10-CM

## 2015-05-12 DIAGNOSIS — I480 Paroxysmal atrial fibrillation: Secondary | ICD-10-CM

## 2015-05-12 DIAGNOSIS — I482 Chronic atrial fibrillation: Secondary | ICD-10-CM

## 2015-05-12 DIAGNOSIS — I4891 Unspecified atrial fibrillation: Secondary | ICD-10-CM

## 2015-05-12 DIAGNOSIS — I639 Cerebral infarction, unspecified: Secondary | ICD-10-CM

## 2015-05-12 DIAGNOSIS — E118 Type 2 diabetes mellitus with unspecified complications: Secondary | ICD-10-CM

## 2015-05-12 DIAGNOSIS — I10 Essential (primary) hypertension: Secondary | ICD-10-CM

## 2015-05-12 DIAGNOSIS — N939 Abnormal uterine and vaginal bleeding, unspecified: Secondary | ICD-10-CM

## 2015-05-12 DIAGNOSIS — G4730 Sleep apnea, unspecified: Secondary | ICD-10-CM

## 2015-05-12 LAB — BASIC METABOLIC PANEL
Anion gap: 5 (ref 5–15)
BUN: 22 mg/dL — ABNORMAL HIGH (ref 6–20)
CO2: 23 mmol/L (ref 22–32)
Calcium: 8.3 mg/dL — ABNORMAL LOW (ref 8.9–10.3)
Chloride: 111 mmol/L (ref 101–111)
Creatinine, Ser: 1.01 mg/dL — ABNORMAL HIGH (ref 0.44–1.00)
GFR calc Af Amer: >60 mL/min (ref >60)
GFR calc non Af Amer: 54 mL/min — ABNORMAL LOW (ref >60)
Glucose, Bld: 110 mg/dL — ABNORMAL HIGH (ref 65–99)
Potassium: 4.2 mmol/L (ref 3.5–5.1)
Sodium: 139 mmol/L (ref 135–145)

## 2015-05-12 LAB — GLUCOSE, CAPILLARY
Glucose-Capillary: 78 mg/dL (ref 65–99)
Glucose-Capillary: 93 mg/dL (ref 65–99)
Glucose-Capillary: 97 mg/dL (ref 65–99)
Glucose-Capillary: 93 mg/dL (ref 65–99)

## 2015-05-12 LAB — CBC
HCT: 34.7 % — ABNORMAL LOW (ref 36.0–46.0)
Hemoglobin: 11.4 g/dL — ABNORMAL LOW (ref 12.0–15.0)
MCH: 32.6 pg (ref 26.0–34.0)
MCHC: 32.9 g/dL (ref 30.0–36.0)
MCV: 99.1 fL (ref 78.0–100.0)
Platelets: 173 10*3/uL (ref 150–400)
RBC: 3.5 MIL/uL — ABNORMAL LOW (ref 3.87–5.11)
RDW: 13.5 % (ref 11.5–15.5)
WBC: 4 10*3/uL (ref 4.0–10.5)

## 2015-05-12 LAB — HEMOGLOBIN A1C
Hgb A1c MFr Bld: 6.1 % — ABNORMAL HIGH (ref 4.8–5.6)
Mean Plasma Glucose: 128 mg/dL

## 2015-05-12 LAB — HEPARIN LEVEL (UNFRACTIONATED): Heparin Unfractionated: 0.31 IU/mL (ref 0.30–0.70)

## 2015-05-12 MED ORDER — HEPARIN (PORCINE) IN NACL 100-0.45 UNIT/ML-% IJ SOLN
750.0000 [IU]/h | INTRAMUSCULAR | Status: AC
  Administered 2015-05-12: 1000 [IU]/h via INTRAVENOUS
  Filled 2015-05-12 (×2): qty 250

## 2015-05-12 MED ORDER — MEGESTROL ACETATE 40 MG PO TABS: 40.0000 mg | ORAL_TABLET | Freq: Every day | ORAL | Status: DC

## 2015-05-12 MED ORDER — MEGESTROL ACETATE 40 MG PO TABS
120.0000 mg | ORAL_TABLET | Freq: Every day | ORAL | Status: DC
  Administered 2015-05-12 – 2015-05-14 (×3): 120 mg via ORAL
  Filled 2015-05-12 (×3): qty 3

## 2015-05-12 MED ORDER — MEGESTROL ACETATE 40 MG PO TABS: 80.0000 mg | ORAL_TABLET | Freq: Every day | ORAL | Status: DC

## 2015-05-12 NOTE — Progress Notes (Signed)
I will begin insurance authorization for a possible admission to inpt rehab pending [facility] insurance approval and bed availability. [phone]

## 2015-05-12 NOTE — Progress Notes (Signed)
[place] for Heparin Indication: atrial fibrillation and stroke  Allergies  Allergen Reactions  . Imitrex [Sumatriptan] Other (See Comments)    Unknown Reaction   . Mango Flavor     Nausea and vomiting  . Oysters [Shellfish Allergy]     Nausea and vomiting    Patient Measurements: Height: '4\' 11"'$  (149.9 cm) Weight: 289 lb 11 oz (131.4 kg) IBW/kg (Calculated) : 43.2 Heparin Dosing Weight: 80 kg  Vital Signs: Temp: 98.8 F (37.1 C) (06/06 1711) Temp Source: Oral (06/06 1711) BP: 121/68 mmHg (06/06 1711) Pulse Rate: 72 (06/06 1711)  Labs:  Recent Labs  05/10/15 1026 05/10/15 1035 05/10/15 1841 05/11/15 1348 05/12/15 0310 05/12/15 1946  HGB 12.6 13.3 11.9* 11.5* 11.4*  --   HCT 38.1 39.0 36.7 35.3* 34.7*  --   PLT 175  --  184 184 173  --   APTT 24  --   --   --   --   --   LABPROT 13.2  --   --   --   --   --   INR 0.98  --   --   --   --   --   HEPARINUNFRC  --   --   --   --   --  0.31  CREATININE 1.33* 1.30* 1.17*  --  1.01*  --     Estimated Creatinine Clearance: 62.4 mL/min (by C-G formula based on Cr of 1.01).  Assessment: 39 YOF who was previously on Coumadin and then Eliquis, but off anticoagulation prior to admission due to post-menopausal vaginal bleeding. She is now on IV heparin for AFib and new CVA. First level this evening is in therapeutic range at 0.31 units/mL.  No bleeidng noted. CBC stable.  Goal of Therapy:  Heparin level 0.3-0.5 units/ml Monitor platelets by anticoagulation protocol: Yes   Plan:  -continue heparin drip at 1000 units/hr -confirmatory level in 8 hours d/t patient's age- will be timed with AM labs -daily HL and CBC -follow for any changes to vaginal bleeding or any other s/s bleeding  Asal Teas D. Yoon Barca, PharmD, BCPS Clinical Pharmacist Pager: [phone] 05/12/2015 8:33 PM

## 2015-05-12 NOTE — Progress Notes (Signed)
ANTICOAGULATION CONSULT NOTE - Initial Consult  Pharmacy Consult for Heparin Indication: atrial fibrillation and stroke  Allergies  Allergen Reactions  . Imitrex [Sumatriptan] Other (See Comments)    Unknown Reaction   . Mango Flavor     Nausea and vomiting  . Oysters [Shellfish Allergy]     Nausea and vomiting    Patient Measurements: Height: '4\' 11"'$  (149.9 cm) Weight: 289 lb 11 oz (131.4 kg) IBW/kg (Calculated) : 43.2 Heparin Dosing Weight: 80 kg  Vital Signs: Temp: 98.3 F (36.8 C) (06/06 0811) Temp Source: Oral (06/06 0811) BP: 159/79 mmHg (06/06 1123)  Labs:  Recent Labs  05/10/15 1026 05/10/15 1035 05/10/15 1841 05/11/15 1348 05/12/15 0310  HGB 12.6 13.3 11.9* 11.5* 11.4*  HCT 38.1 39.0 36.7 35.3* 34.7*  PLT 175  --  184 184 173  APTT 24  --   --   --   --   LABPROT 13.2  --   --   --   --   INR 0.98  --   --   --   --   CREATININE 1.33* 1.30* 1.17*  --  1.01*    Estimated Creatinine Clearance: 62.4 mL/min (by C-G formula based on Cr of 1.01).   Medical History: Past Medical History  Diagnosis Date  . Diabetes mellitus   . Asthma   . Chronic knee pain   . Back pain, chronic   . Hypertension   . Vertigo   . Hyperlipidemia   . TIA (transient ischemic attack)   . Anemia   . HOH (hard of hearing)   . Arthritis   . Obesity   . PMB (postmenopausal bleeding) 11/26/2014  . Urge incontinence 11/26/2014  . Thickened endometrium 01/15/2015  . Dysrhythmia   . Stroke     Mini Stroke   Assessment:   To begin IV heparin without bolus for new CVA and hx atrial fibrillation.  Previously on Coumadin and then Eliquis, but off anticoagulation prior to admission due to post-menopausal vaginal bleeding. Discussed with Dr. Algis Liming, who discussed with Gyn and Neuro.  Goal of Therapy:  Heparin level 0.3-0.5 units/ml Monitor platelets by anticoagulation protocol: Yes   Plan:    Begin heparin drip at 1000 units/hr (~ 12 units/kg adjusted body weight/hr)  Heparin level ~ 6 hrs after drip begins.    Target heparin level 0.3-0.5, lower end of therapeutic range.    Daily heparin level and CBC while on heparin.    Follow up for any increase in vaginal bleeding; patient to report.  Arty Baumgartner, [place] Pager: [phone] 05/12/2015,1:42 PM

## 2015-05-12 NOTE — Progress Notes (Addendum)
PROGRESS NOTE    [name] OFB:[id number] DOB: [birth year] DOA: 05/10/2015 PCP: Rosita Fire, MD  HPI/Brief narrative 74 y.o. female with a history of afib, htn, dm, vaginal bleeding who presented to [facility] with left-sided weakness that started 6/3 at 4 AM. She states that she has been working with it and his been getting slightly better, however was still significant and therefore she went to the emergency room at [facility]. Given that there is no neurology there on the weekend she was transferred to [facility]. Patient has been off Eliquis a few months due to bleeding issues. She is s/p hysteroscopy and uterine curettage by Dr. Tania Ade on 04/02/15 for simple and complex hyperplasia without atypia. Postprocedure she had spotting and was placed on Provera but continues to have spotting or even mild bleeding. She states she changes 3 pull-ups daily. Evaluation at [facility] reveals right MCA/ACA territory infarcts. Neurology/stroke team consulting.   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 fib in the absence of anticoagulation due to vaginal bleed and MCA branch artery occlusions - Was on aspirin 81 MG PTA. Now on aspirin 325 MG daily for secondary stroke prophylaxis. - Ideally needs to be on anticoagulation but held until safe to start from a vaginal bleeding perspective. Anticoagulation to start after definitive treatment for vaginal bleeding. - Not candidate for TPA due to vaginal bleeding. - MRI brain: Acute right nonhemorrhagic infarcts. MRA brain: High-grade stenosis of the proximal right MCA branches - Carotid Doppler: Unremarkable - 2-D echo: Pending - LDL 148: On simvastatin -  A1c: 6.1 - ST evaluation: No further needs - PT and OT evaluation: Recommend inpatient rehabilitation consult - Discussed with Dr. Austin Miles M.D. - Discussed extensively with patient's primary GYN MD who recommended starting  anticoagulation and suspects no major bleeding issues. Please see below   Paroxysmal A. fib  - Now in sinus rhythm  - Continue Cardizem  - CHA2DS2-VASc Score: 6 - We'll start IV heparin without bolus and monitor for 24-48 hours and if no worsening of vaginal bleeding, may consider Eliquis   Postmenopausal Vaginal bleeding - s/p hysteroscopy and uterine curettage by Dr. Tania Ade on 04/02/15 for simple and complex hyperplasia without atypia.  - Was on Provera 10 MG daily postprocedure for spotting but continues to have spotting/mild vaginal bleeding - Discussed with on call GYN M.D. Dr. Roselie Awkward who recommended changing Provera to Megace 40 mg daily and follow up with primary GYN M.D. regarding definitive treatment. - Discussed extensively with patient's primary gynecologist Dr. Tania Ade who indicated that patient is a poor surgical candidate for hysterectomy. She does not have a intrauterine device and is not a candidate for same. He recommended increasing Megace to 120 mg daily 5 days, then 80 mg daily 5 days, then continue 40 mg daily. He suggested starting patient on anticoagulation and indicates low risk for heavy vaginal bleeding.  Essential hypertension - Controlled  Type II DM - Oral hypoglycemics held. - Continue SSI - Controlled  Mild anemia - Follow CBCs in the context of vaginal bleeding. - Stable  Morbid obesity/Body mass index is 58.48 kg/(m^2).  Hyperlipidemia - Simvastatin was substituted for increased dose Lipitor.  Mild acute on stage II chronic kidney disease - Creatinine improved from 1.3 > 1.17 - Creatinine has normalized.  DVT prophylaxis: SCDs . Started on IV heparin drip. Code Status: Full Family Communication: None at bedside  Disposition Plan: To be determined pending  rehabilitation consultation   Consultants:  Neurology/stroke service  Rehabilitation M.D.  Procedures:  EEG 05/12/15: IMPRESSION: Normal drowsy electroencephalogram. There are  no focal lateralizing or epileptiform features.   Antibiotics:  None   Subjective: Use to complain of intermittent tingling of left side of face, weakness left extremities-upper greater than lower. Overnight events noted: Transient dysphagia and left facial tingling-neurologist evaluated. No change in vaginal bleeding.  Objective: Filed Vitals:   05/12/15 0000 05/12/15 0400 05/12/15 0811 05/12/15 1123  BP: 122/59 130/74  159/79  Pulse:      Temp: 98.3 F (36.8 C) 98.5 F (36.9 C) 98.3 F (36.8 C)   TempSrc: Oral  Oral   Resp:      Height:      Weight:      SpO2:       pulse 83/m, respiratory rate 21/m and oxygen saturation 96%.  Intake/Output Summary (Last 24 hours) at 05/12/15 1649 Last data filed at 05/12/15 0830  Gross per 24 hour  Intake 1188.33 ml  Output      0 ml  Net 1188.33 ml   Filed Weights   05/10/15 1018 05/11/15 0400  Weight: 127.914 kg (282 lb) 131.4 kg (289 lb 11 oz)     Exam:  General exam: Morbidly obese female lying comfortably in bed  Respiratory system: Clear. No increased work of breathing. Cardiovascular system: S1 & S2 heard, RRR. No JVD, murmurs, gallops, clicks or pedal edema. Telemetry: A. fib with controlled ventricular rate Gastrointestinal system: Abdomen is nondistended, soft and nontender. Normal bowel sounds heard. Central nervous system: Alert and oriented. dysarthria +.? Facial asymmetry.  Extremities: 5 x 5 power in right extremities. Appears weaker than left extremities with grade 0 x 5 power in left upper extremity and grade 4 x 5 power in the left lower extremity    Data Reviewed: Basic Metabolic Panel:  Recent Labs Lab 05/10/15 1026 05/10/15 1035 05/10/15 1841 05/12/15 0310  NA 141 141  --  139  K 5.0 4.9  --  4.2  CL 108 108  --  111  CO2 25  --   --  23  GLUCOSE 116* 114*  --  110*  BUN 27* 26*  --  22*  CREATININE 1.33* 1.30* 1.17* 1.01*  CALCIUM 8.7*  --   --  8.3*   Liver Function Tests:  Recent  Labs Lab 05/10/15 1026  AST 14*  ALT 12*  ALKPHOS 46  BILITOT 0.7  PROT 6.7  ALBUMIN 3.5   No results for input(s): LIPASE, AMYLASE in the last 168 hours. No results for input(s): AMMONIA in the last 168 hours. CBC:  Recent Labs Lab 05/10/15 1026 05/10/15 1035 05/10/15 1841 05/11/15 1348 05/12/15 0310  WBC 4.4  --  4.6 3.8* 4.0  NEUTROABS 2.7  --   --   --   --   HGB 12.6 13.3 11.9* 11.5* 11.4*  HCT 38.1 39.0 36.7 35.3* 34.7*  MCV 100.3*  --  98.7 98.9 99.1  PLT 175  --  184 184 173   Cardiac Enzymes: No results for input(s): CKTOTAL, CKMB, CKMBINDEX, TROPONINI in the last 168 hours. BNP (last 3 results) No results for input(s): PROBNP in the last 8760 hours. CBG:  Recent Labs Lab 05/11/15 1611 05/11/15 2128 05/12/15 0810 05/12/15 1148 05/12/15 1631  GLUCAP 118* 87 78 93 97    Recent Results (from the past 240 hour(s))  MRSA PCR Screening     Status: None   Collection Time:  05/10/15  6:30 PM  Result Value Ref Range Status   MRSA by PCR NEGATIVE NEGATIVE Final    Comment:        The GeneXpert MRSA Assay (FDA approved for NASAL specimens only), is one component of a comprehensive MRSA colonization surveillance program. It is not intended to diagnose MRSA infection nor to guide or monitor treatment for MRSA infections.        Studies: Dg Chest 2 View  05/10/2015   CLINICAL DATA:  Acute stroke.  Left arm and leg weakness.  EXAM: CHEST  2 VIEW  COMPARISON:  03/31/2013 and 12/15/2011  FINDINGS: Heart size and pulmonary vascularity are normal and the lungs are clear. No acute osseous abnormality. Flowing osteophytes fuse much of the thoracic spine.  IMPRESSION: No active cardiopulmonary disease.   Electronically Signed   By: Lorriane Shire M.D.   On: 05/10/2015 21:37   Mr Brain Wo Contrast  05/10/2015   CLINICAL DATA:  74 year old diabetic hypertensive female with hyperlipidemia and atrial fibrillation presenting with left arm and leg weakness. Subsequent  encounter.  EXAM: MRI HEAD WITHOUT CONTRAST  MRA HEAD WITHOUT CONTRAST  TECHNIQUE: Multiplanar, multiecho pulse sequences of the brain and surrounding structures were obtained without intravenous contrast. Angiographic images of the head were obtained using MRA technique without contrast.  COMPARISON:  05/10/2015 head CT.  07/27/2014 MR.  FINDINGS: MR BRAIN:  Acute nonhemorrhagic infarct right caudate. Acute nonhemorrhagic infarcts in a parasagittal distribution within the right frontal lobe and anterior aspect of the right parietal lobe.  Remote anterior right frontal lobe/genu of the corpus callosum infarct with encephalomalacia and minimal amount of blood breakdown products in the adjacent right genu of the corpus callosum. Remote small basal ganglia infarcts bilaterally.  Moderate small vessel disease type changes.  Mild atrophy without hydrocephalus.  No intracranial mass lesion noted on this unenhanced exam.  Partial opacification mastoid air cells greater on right. No obstructing lesion of the eustachian tube noted. Paranasal sinus mucosal thickening.  Post lens replacement otherwise orbital structures unremarkable.  Expanded partially empty sella. This finding and slightly prominent peri optic spaces can be seen in this setting of pseudotumor cerebri. No flattening of the posterior aspect of the globes.  Cervical medullary junction and pineal region unremarkable.  MRA HEAD FINDINGS  Ectatic right internal carotid artery distal cervical segment.  Slight irregularity and ectasia of the cavernous segment of the internal carotid artery bilaterally.  High-grade stenosis of proximal right middle cerebral artery branches just beyond the bifurcation. Decrease number of visualized right middle cerebral artery branches consistent with patient's acute infarct.  High-grade stenosis distal A1 segment right anterior cerebral artery. Markedly narrowed and irregular A2 segment anterior cerebral artery more notable on the  right.  Fetal type contribution to the left posterior cerebral artery.  Mild to moderate narrowing left middle cerebral artery branch vessels.  No significant stenosis of the distal vertebral arteries or basilar artery.  Nonvisualized posterior inferior cerebellar arteries.  Moderate narrowing proximal left anterior inferior cerebellar artery.  Ectatic proximal aspect of the left superior cerebellar artery without saccular aneurysm.  Mild prominence distal basilar artery without saccular aneurysm.  Posterior cerebral artery mild moderate distal branch vessel irregularity.  IMPRESSION: MR BRAIN:  Acute nonhemorrhagic infarct right caudate. Acute nonhemorrhagic infarcts in a parasagittal distribution within the right frontal lobe and anterior aspect of the right parietal lobe.  Remote anterior right frontal lobe/genu of the corpus callosum infarct. Remote small basal ganglia infarcts bilaterally.  Moderate small  vessel disease type changes.  Partial opacification mastoid air cells greater on right. Paranasal sinus mucosal thickening.  Expanded partially empty sella. This finding and slightly prominent peri optic spaces can be seen in this setting of pseudotumor cerebri. No flattening of the posterior aspect of the globes.  MRA HEAD FINDINGS  High-grade stenosis of proximal right middle cerebral artery branches just beyond the bifurcation. Decrease number of visualized right middle cerebral artery branches consistent with patient's acute infarct.  Please see above for additional findings.   Electronically Signed   By: Genia Del M.D.   On: 05/10/2015 21:22   Mr Jodene Nam Head/brain Wo Cm  05/10/2015   CLINICAL DATA:  74 year old diabetic hypertensive female with hyperlipidemia and atrial fibrillation presenting with left arm and leg weakness. Subsequent encounter.  EXAM: MRI HEAD WITHOUT CONTRAST  MRA HEAD WITHOUT CONTRAST  TECHNIQUE: Multiplanar, multiecho pulse sequences of the brain and surrounding structures were  obtained without intravenous contrast. Angiographic images of the head were obtained using MRA technique without contrast.  COMPARISON:  05/10/2015 head CT.  07/27/2014 MR.  FINDINGS: MR BRAIN:  Acute nonhemorrhagic infarct right caudate. Acute nonhemorrhagic infarcts in a parasagittal distribution within the right frontal lobe and anterior aspect of the right parietal lobe.  Remote anterior right frontal lobe/genu of the corpus callosum infarct with encephalomalacia and minimal amount of blood breakdown products in the adjacent right genu of the corpus callosum. Remote small basal ganglia infarcts bilaterally.  Moderate small vessel disease type changes.  Mild atrophy without hydrocephalus.  No intracranial mass lesion noted on this unenhanced exam.  Partial opacification mastoid air cells greater on right. No obstructing lesion of the eustachian tube noted. Paranasal sinus mucosal thickening.  Post lens replacement otherwise orbital structures unremarkable.  Expanded partially empty sella. This finding and slightly prominent peri optic spaces can be seen in this setting of pseudotumor cerebri. No flattening of the posterior aspect of the globes.  Cervical medullary junction and pineal region unremarkable.  MRA HEAD FINDINGS  Ectatic right internal carotid artery distal cervical segment.  Slight irregularity and ectasia of the cavernous segment of the internal carotid artery bilaterally.  High-grade stenosis of proximal right middle cerebral artery branches just beyond the bifurcation. Decrease number of visualized right middle cerebral artery branches consistent with patient's acute infarct.  High-grade stenosis distal A1 segment right anterior cerebral artery. Markedly narrowed and irregular A2 segment anterior cerebral artery more notable on the right.  Fetal type contribution to the left posterior cerebral artery.  Mild to moderate narrowing left middle cerebral artery branch vessels.  No significant stenosis  of the distal vertebral arteries or basilar artery.  Nonvisualized posterior inferior cerebellar arteries.  Moderate narrowing proximal left anterior inferior cerebellar artery.  Ectatic proximal aspect of the left superior cerebellar artery without saccular aneurysm.  Mild prominence distal basilar artery without saccular aneurysm.  Posterior cerebral artery mild moderate distal branch vessel irregularity.  IMPRESSION: MR BRAIN:  Acute nonhemorrhagic infarct right caudate. Acute nonhemorrhagic infarcts in a parasagittal distribution within the right frontal lobe and anterior aspect of the right parietal lobe.  Remote anterior right frontal lobe/genu of the corpus callosum infarct. Remote small basal ganglia infarcts bilaterally.  Moderate small vessel disease type changes.  Partial opacification mastoid air cells greater on right. Paranasal sinus mucosal thickening.  Expanded partially empty sella. This finding and slightly prominent peri optic spaces can be seen in this setting of pseudotumor cerebri. No flattening of the posterior aspect of the globes.  MRA HEAD FINDINGS  High-grade stenosis of proximal right middle cerebral artery branches just beyond the bifurcation. Decrease number of visualized right middle cerebral artery branches consistent with patient's acute infarct.  Please see above for additional findings.   Electronically Signed   By: Genia Del M.D.   On: 05/10/2015 21:22        Scheduled Meds: . aspirin  325 mg Oral Daily  . atorvastatin  20 mg Oral q1800  . diltiazem  120 mg Oral Daily  . fesoterodine  4 mg Oral Daily  . insulin aspart  0-15 Units Subcutaneous TID WC  . insulin aspart  0-5 Units Subcutaneous QHS  . Linaclotide  145 mcg Oral Daily  . megestrol  120 mg Oral Daily   Followed by  . [START ON 05/17/2015] megestrol  80 mg Oral Daily   Followed by  . [START ON 05/22/2015] megestrol  40 mg Oral Daily   Continuous Infusions: . heparin 1,000 Units/hr (05/12/15 1404)      Active Problems:   DM (diabetes mellitus), type 2 with complications   OBESITY, MORBID   Essential hypertension, benign   Atrial fibrillation   Hyperlipidemia   Sleep apnea   PMB (postmenopausal bleeding)   Left-sided weakness   CVA (cerebral vascular accident)   Paroxysmal atrial fibrillation   Stroke   Vaginal bleeding    Time spent: 62 minutes    HONGALGI,ANAND, MD, FACP, FHM. [facility] Pager [phone]  If 7PM-7AM, please contact night-coverage [email or web address] Password [email or web address] 05/12/2015, 4:49 PM    LOS: 2 days

## 2015-05-12 NOTE — Evaluation (Signed)
Physical Therapy Evaluation Patient Details Name: [name] MRN: [id number] DOB: Jan 24, 1941 Today's Date: 05/12/2015   History of Present Illness  pt presents with R Caudate and R MCA Embolic Infarcts.  pt also with Vaginal bleeding and being followed by GYN in [place].    Clinical Impression  Pt very motivated for therapy and improving overall mobility.  Pt concerned about preventing further strokes and managing her vaginal bleeding.  Feel pt would benefit from CIR at D/C to maximize independence.  Will continue to follow.      Follow Up Recommendations CIR    Equipment Recommendations  None recommended by PT    Recommendations for Other Services Rehab consult     Precautions / Restrictions Precautions Precautions: Fall Restrictions Weight Bearing Restrictions: No      Mobility  Bed Mobility Overal bed mobility: Needs Assistance Bed Mobility: Supine to Sit     Supine to sit: Min assist;HOB elevated     General bed mobility comments: Sitting towards L side with pt using bed rails for pulling up to sitting.    Transfers Overall transfer level: Needs assistance Equipment used: 2 person hand held assist Transfers: Sit to/from [facility] Sit to Stand: Min assist;+2 physical assistance Stand pivot transfers: Min assist;+2 physical assistance       General transfer comment: cues for use of R UE and movements through pivot to chair.  pt able to extend L knee to come to stand, but knee weakness and near buckling noted during transfer.    Ambulation/Gait                Stairs            Wheelchair Mobility    Modified Rankin (Stroke Patients Only) Modified Rankin (Stroke Patients Only) Pre-Morbid Rankin Score: No significant disability Modified Rankin: Severe disability     Balance Overall balance assessment: Needs assistance Sitting-balance support: No upper extremity supported;Feet supported Sitting balance-Leahy Scale:  Fair     Standing balance support: During functional activity Standing balance-Leahy Scale: Poor                               Pertinent Vitals/Pain Pain Assessment: No/denies pain    Home Living Family/patient expects to be discharged to:: Inpatient rehab                      Prior Function Level of Independence: Independent with assistive device(s)               Hand Dominance   Dominant Hand: Right    Extremity/Trunk Assessment   Upper Extremity Assessment: Defer to OT evaluation           Lower Extremity Assessment: LLE deficits/detail   LLE Deficits / Details: Strength grossly 3+/5 with decreased coordination and increased fatigue.    Cervical / Trunk Assessment: Normal  Communication   Communication: No difficulties (pt indicates some slurring, but is easily understood.  )  Cognition Arousal/Alertness: Awake/alert Behavior During Therapy: WFL for tasks assessed/performed Overall Cognitive Status: Within Functional Limits for tasks assessed                      General Comments      Exercises        Assessment/Plan    PT Assessment Patient needs continued PT services  PT Diagnosis Difficulty walking   PT Problem List Decreased  strength;Decreased activity tolerance;Decreased balance;Decreased coordination;Decreased mobility;Decreased knowledge of use of DME;Decreased knowledge of precautions;Obesity  PT Treatment Interventions DME instruction;Gait training;Functional mobility training;Therapeutic activities;Therapeutic exercise;Balance training;Neuromuscular re-education;Patient/family education   PT Goals (Current goals can be found in the Care Plan section) Acute Rehab PT Goals Patient Stated Goal: Get better, so I can get a boyfriend. PT Goal Formulation: With patient Time For Goal Achievement: 05/26/15 Potential to Achieve Goals: Good    Frequency Min 4X/week   Barriers to discharge        Co-evaluation                End of Session Equipment Utilized During Treatment: Gait belt;Oxygen Activity Tolerance: Patient tolerated treatment well Patient left: in chair;with call bell/phone within reach;with family/visitor present Nurse Communication: Mobility status         Time: 9509-3267 PT Time Calculation (min) (ACUTE ONLY): 36 min   Charges:   PT Evaluation $Initial PT Evaluation Tier I: 1 Procedure PT Treatments $Therapeutic Activity: 8-22 mins   PT G CodesCatarina Hartshorn, Damon 05/12/2015, 12:45 PM

## 2015-05-12 NOTE — Evaluation (Signed)
Occupational Therapy Evaluation Patient Details Name: [name] MRN: [id number] DOB: 07-03-1941 Today's Date: 05/12/2015    History of Present Illness pt presents with R Caudate and R MCA Embolic Infarcts.  pt also with Vaginal bleeding and being followed by GYN in [place].     Clinical Impression   Pt admitted with above. Pt independent with ADLs, PTA. Feel pt will benefit from acute OT to increase independence prior to d/c. Feel pt is a great candidate for CIR.    Follow Up Recommendations  CIR    Equipment Recommendations  Other (comment) (defer to next venue)    Recommendations for Other Services Rehab consult     Precautions / Restrictions Precautions Precautions: Fall Restrictions Weight Bearing Restrictions: No      Mobility Bed Mobility Overal bed mobility: Needs Assistance Bed Mobility: Supine to Sit;Sit to Supine;Rolling Rolling: Min guard   Supine to sit: Max assist Sit to supine: Mod assist   General bed mobility comments: Cues for technique. Assist to get adjusted in bed.  trendelenburg position used as well.  Transfers Overall transfer level: Needs assistance Equipment used: 2 person hand held assist Transfers: Sit to/from [facility] Sit to Stand: Min guard Stand pivot transfers: Min guard       General transfer comment: Min guard for safety.    Balance Min guard for sit to stand and stand pivot transfers. Once pt was positioned well sitting EOB-she was able to maintain balance without physical assist. Balance not formally assessed.                         ADL Overall ADL's : Needs assistance/impaired                 Upper Body Dressing : Moderate assistance;Sitting   Lower Body Dressing: Maximal assistance;Sit to/from stand   Toilet Transfer: Min guard;Stand-pivot;BSC   Toileting- Water quality scientist and Hygiene: Maximal assistance;Sit to/from stand       Functional mobility during ADLs: Min  guard (stand pivot) General ADL Comments: Pt tranferred to [facility] and had BM. Assisted in toilet hygiene and educated on what pt could use for toilet aide. Educated on dressing technique.  Educated on being aware of LUE and supporting it on pillow-placed pillow under left arm at end of session.     Vision  Pt wears glasses all the time;  Vision Assessment?: Yes Visual Fields: Other (comment) (pt looking around despite cues to keep eyes on OT- so not a great assessment)   Perception     Praxis      Pertinent Vitals/Pain Pain Assessment: Faces Pain Score: 0-No pain     Hand Dominance Right   Extremity/Trunk Assessment Upper Extremity Assessment Upper Extremity Assessment: LUE deficits/detail LUE Deficits / Details: can shrug shoulder, otherwise no movement noted in RUE LUE Sensation: decreased light touch LUE Coordination: decreased fine motor;decreased gross motor   Lower Extremity Assessment Lower Extremity Assessment: Defer to PT evaluation LLE Deficits / Details: Strength grossly 3+/5 with decreased coordination and increased fatigue.   LLE Coordination: decreased fine motor;decreased gross motor   Cervical / Trunk Assessment Cervical / Trunk Assessment: Normal   Communication Communication Communication: No difficulties   Cognition Arousal/Alertness: Awake/alert Behavior During Therapy: WFL for tasks assessed/performed Overall Cognitive Status: Within Functional Limits for tasks assessed (with exception to vision testing-see vision section below-not following commands with this well)  General Comments       Exercises       Shoulder Instructions      Home Living Family/patient expects to be discharged to:: Inpatient rehab Living Arrangements: Children Available Help at Discharge: Family;Available 24 hours/day Type of Home: House                                  Prior Functioning/Environment Level of Independence:  Independent with assistive device(s)             OT Diagnosis: Hemiplegia non-dominant side   OT Problem List: Decreased strength;Decreased range of motion;Decreased coordination;Decreased knowledge of use of DME or AE;Decreased knowledge of precautions;Impaired sensation;Impaired UE functional use   OT Treatment/Interventions: Self-care/ADL training;DME and/or AE instruction;Therapeutic activities;Visual/perceptual remediation/compensation;Patient/family education;Balance training;Neuromuscular education;Therapeutic exercise    OT Goals(Current goals can be found in the care plan section) Acute Rehab OT Goals Patient Stated Goal: go to rehab OT Goal Formulation: With patient Time For Goal Achievement: 05/19/15 Potential to Achieve Goals: Good ADL Goals Pt Will Perform Upper Body Bathing: with set-up;with supervision;sitting Pt Will Perform Lower Body Bathing: with adaptive equipment;sit to/from stand;with min guard assist Pt Will Perform Upper Body Dressing: with min assist;sitting Pt Will Transfer to Toilet: with min guard assist;ambulating;bedside commode Pt Will Perform Toileting - Clothing Manipulation and hygiene: with adaptive equipment;sit to/from stand;with min guard assist  OT Frequency: Min 2X/week   Barriers to D/C:            Co-evaluation              End of Session Equipment Utilized During Treatment: Gait belt;Oxygen Nurse Communication: Mobility status (recommend having 2 people when getting up; had BM)  Activity Tolerance: Patient tolerated treatment well Patient left: in bed;with call bell/phone within reach   Time: 1413-1436 OT Time Calculation (min): 23 min Charges:  OT General Charges $OT Visit: 1 Procedure OT Evaluation $Initial OT Evaluation Tier I: 1 Procedure G-CodesBenito Mccreedy OTR/L [id number] 05/12/2015, 3:08 PM

## 2015-05-12 NOTE — Progress Notes (Signed)
VASCULAR LAB PRELIMINARY  PRELIMINARY  PRELIMINARY  PRELIMINARY  Carotid duplex completed.    Preliminary report:  Bilateral:  1-39% ICA stenosis.  Vertebral artery flow is antegrade.     Peretz Thieme, RVS 05/12/2015, 12:41 PM

## 2015-05-12 NOTE — Progress Notes (Signed)
Patient's oxygen saturation while sleeping has intermittently dropped to low 80's.  Pt awakens easily, snoring is noted.  O2 at 2L applied per [employer] while sleeping.  Will continue to monitor.

## 2015-05-12 NOTE — Progress Notes (Signed)
STROKE TEAM PROGRESS NOTE   HISTORY [name] is a 74 y.o. female with a history of afib, htn, dm, and vaginal bleeding who presents with left-sided weakness that started 6/3 on awakening. She states that she has been working with it and his been getting slightly better, however was still significant and therefore she went to the emergency room at [facility]. Given that there is no neurology there on the weekend she was transferred to [facility] cone.  She states that she is getting better overall, but did have some left facial numbness earlier today.  Of note, she has been having vaginal bleeding since her procedure and has been off of her anticoagulation because of this. She does have a history of atrial fibrillation. She has been taking aspirin daily.   SUBJECTIVE (INTERVAL HISTORY) The patient's daughter is at bedside. Daughter stated that pt had one episode of aphasia last night lasted about 4-5 min and resolved. RN also stated that her left UE muscle strength was waxing and waning. Pt stated that she had transient left facial numbness last night. EEG was done and showed no seizure. Dr. Algis Liming discussed with pt's OBGYN who stated that pt is not a candidate for hysterectomy but no contraindication for anticoagulation. Will start heparin drip today no bolus.  OBJECTIVE Temp:  [98.3 F (36.8 C)-98.8 F (37.1 C)] 98.3 F (36.8 C) (06/06 0811) Pulse Rate:  [83] 83 (06/05 2000) Cardiac Rhythm:  [-]  Resp:  [21] 21 (06/05 2000) BP: (122-159)/(59-79) 159/79 mmHg (06/06 1123) SpO2:  [96 %] 96 % (06/05 2000)   Recent Labs Lab 05/11/15 1123 05/11/15 1611 05/11/15 2128 05/12/15 0810 05/12/15 1148  GLUCAP 93 118* 87 78 93    Recent Labs Lab 05/10/15 1026 05/10/15 1035 05/10/15 1841 05/12/15 0310  NA 141 141  --  139  K 5.0 4.9  --  4.2  CL 108 108  --  111  CO2 25  --   --  23  GLUCOSE 116* 114*  --  110*  BUN 27* 26*  --  22*  CREATININE 1.33* 1.30* 1.17* 1.01*  CALCIUM  8.7*  --   --  8.3*    Recent Labs Lab 05/10/15 1026  AST 14*  ALT 12*  ALKPHOS 46  BILITOT 0.7  PROT 6.7  ALBUMIN 3.5    Recent Labs Lab 05/10/15 1026 05/10/15 1035 05/10/15 1841 05/11/15 1348 05/12/15 0310  WBC 4.4  --  4.6 3.8* 4.0  NEUTROABS 2.7  --   --   --   --   HGB 12.6 13.3 11.9* 11.5* 11.4*  HCT 38.1 39.0 36.7 35.3* 34.7*  MCV 100.3*  --  98.7 98.9 99.1  PLT 175  --  184 184 173   No results for input(s): CKTOTAL, CKMB, CKMBINDEX, TROPONINI in the last 168 hours.  Recent Labs  05/10/15 1026  LABPROT 13.2  INR 0.98    Recent Labs  05/11/15 0106  COLORURINE RED*  LABSPEC 1.035*  PHURINE 5.0  GLUCOSEU NEGATIVE  HGBUR LARGE*  BILIRUBINUR MODERATE*  KETONESUR 15*  PROTEINUR 100*  UROBILINOGEN 1.0  NITRITE NEGATIVE  LEUKOCYTESUR SMALL*       Component Value Date/Time   CHOL 207* 05/11/2015 0253   TRIG 75 05/11/2015 0253   HDL 44 05/11/2015 0253   CHOLHDL 4.7 05/11/2015 0253   VLDL 15 05/11/2015 0253   LDLCALC 148* 05/11/2015 0253   Lab Results  Component Value Date   HGBA1C 6.1* 05/11/2015  Component Value Date/Time   LABOPIA NONE DETECTED 05/11/2015 0106   COCAINSCRNUR NONE DETECTED 05/11/2015 0106   LABBENZ NONE DETECTED 05/11/2015 0106   AMPHETMU NONE DETECTED 05/11/2015 0106   THCU NONE DETECTED 05/11/2015 0106   LABBARB NONE DETECTED 05/11/2015 0106     Recent Labs Lab 05/10/15 [place] <5   I have personally reviewed the radiological images below and agree with the radiology interpretations.  Dg Chest 2 View 05/10/2015    No active cardiopulmonary disease.    Ct Head Wo Contrast 05/10/2015    1. Degraded examination without definite acute intracranial process.  2. Similar findings of advanced microvascular ischemic disease.   Mr Brain Wo Contrast 05/10/2015    MR BRAIN:  Acute nonhemorrhagic infarct right caudate. Acute nonhemorrhagic infarcts in a parasagittal distribution within the right frontal lobe and  anterior aspect of the right parietal lobe.  Remote anterior right frontal lobe/genu of the corpus callosum infarct. Remote small basal ganglia infarcts bilaterally.  Moderate small vessel disease type changes.  Partial opacification mastoid air cells greater on right. Paranasal sinus mucosal thickening.  Expanded partially empty sella. This finding and slightly prominent peri optic spaces can be seen in this setting of pseudotumor cerebri. No flattening of the posterior aspect of the globes.    MRA HEAD  High-grade stenosis of proximal right middle cerebral artery branches just beyond the bifurcation. Decrease number of visualized right middle cerebral artery branches consistent with patient's acute infarct.  Please see above for additional findings.     2-D echo - pending  Carotid Doppler - Bilateral: 1-39% ICA stenosis. Vertebral artery flow is antegrade.  PHYSICAL EXAM  Temp:  [98.3 F (36.8 C)-98.8 F (37.1 C)] 98.3 F (36.8 C) (06/06 0811) Pulse Rate:  [83] 83 (06/05 2000) Resp:  [21] 21 (06/05 2000) BP: (122-159)/(59-79) 159/79 mmHg (06/06 1123) SpO2:  [96 %] 96 % (06/05 2000)  General - morbid obesity, well developed, in no apparent distress.  Ophthalmologic - Fundi not visualized due to eye movement.  Cardiovascular - irregularly irregular heart rate and rhythm.  Mental Status -  Level of arousal and orientation to time, place, and person were intact. Language including expression, naming, repetition, comprehension was assessed and found intact.  Cranial Nerves II - XII - II - Visual field intact OU. III, IV, VI - Extraocular movements intact. V - Facial sensation intact bilaterally. VII - Facial movement intact bilaterally. VIII - Hearing & vestibular intact bilaterally. X - Palate elevates symmetrically. XI - Chin turning & shoulder shrug intact bilaterally. XII - Tongue protrusion intact.  Motor Strength - The patient's strength was LUE 0/5 proximal distal, LLE  5/5, and RUE and RLE 5/5.Marland Kitchen  Bulk was normal and fasciculations were absent.   Motor Tone - Muscle tone was assessed at the neck and appendages and was normal.  Reflexes - The patient's reflexes were 1+ in all extremities and she had no pathological reflexes.  Sensory - Light touch, temperature/pinprick were assessed and were decreased on the left.    Coordination - The patient had normal movements in the right hand with no ataxia or dysmetria.  Tremor was absent.  Gait and Station - not tested due to safety concerns.   ASSESSMENT/PLAN [name] is a 74 y.o. female with history of atrial fibrillation on aspirin 81 mg daily prior to admission, hypertension, a previous TIA and stroke, hyperlipidemia, diabetes mellitus, and vaginal bleeding presenting with left arm weakness.  She did not  receive IV t-PA due to vaginal bleeding history.  Stroke:  Non- Dominant right caudate and right MCA infarcts felt to be embolic secondary to atrial fibrillation. Patient also had right ACA stroke in 07/2014, put on Eliquis which was discontinued due to vaginal bleeding.  Resultant  left upper extremity plegia  MRI - as above.   MRA - high-grade stenosis of the proximal right middle cerebral artery branches.  Carotid Doppler unremarkable  2D Echo  pending  LDL 148, not at goal  HgbA1c 6.1  Lovenox for VTE prophylaxis Diet Carb Modified Fluid consistency:: Thin; Room service appropriate?: Yes  aspirin 81 mg orally every day prior to admission, now on aspirin 325 mg orally every day. After discuss with pt's OBGYN physician who stated that pt is not a candidate for hysterectomy but no contraindication for anticoagulation, we will start heparin drip for her without bolus.   Patient counseled to be compliant with her antithrombotic medications  Ongoing aggressive stroke risk factor management  Therapy recommendations: CIR  Disposition:  Pending  Atrial fibrillation  Likely the cause for  recurrent stroke  Was on eliquis, however, discontinued due to vaginal bleeding   For stroke prevention, anticoagulation is needed   After discuss with pt's OBGYN physician who stated that pt is not a candidate for hysterectomy but no contraindication for anticoagulation, we will start heparin drip for her without bolus.  Vaginal bleeding  We recommended hysterectomy to stop bleeding  However, pt's stated that pt is not a candidate for hysterectomy but no contraindication for anticoagulation  Increased megace to '120mg'$  with tapering dose  Close monitor bleeding and CBC  History of stroke  07/2014 right ACA stroke  Was on Coumadin prior, discontinued due to vaginal bleeding  Put on eliquis at the time  However Eliquis was also discontinued due to vaginal bleeding  Hypertension  Home meds: Cartia  Stable  Hyperlipidemia  Home meds:  Zocor 20 mg daily resumed in hospital  LDL 148, goal < 70  Increase Zocor to 40 mg daily   Continue statin at discharge  Diabetes  HgbA1c pending, goal < 7.0  Controlled  Other Stroke Risk Factors  Advanced age  Cigarette smoker, quit smoking.  Obesity, Body mass index is 58.48 kg/(m^2).   Hx stroke/TIA  Family hx stroke (grandmother)  Other Active Problems   Renal insufficiency    Vaginal bleeding s/p D&C, still has bleeding on admission  Other Pertinent History    Hospital day # 2  Mikey Bussing PA-C [facility] Pager [phone] 05/12/2015, 1:47 PM  I, the attending vascular neurologist, have personally obtained a history, examined the patient, evaluated laboratory data, individually viewed imaging studies and agree with radiology interpretations. I obtained additional history from pt's daughter and RN at bedside. I also discussed with daughter and pt and Dr. Algis Liming regarding her care plan. Together with the NP/PA, we formulated the assessment and plan of care which reflects our mutual  decision.  I have made any additions or clarifications directly to the above note and agree with the findings and plan as currently documented.   Rosalin Hawking, MD PhD Stroke Neurology 05/12/2015 1:57 PM   To contact Stroke Continuity provider, please refer to [email or web address] After hours, contact General Neurology

## 2015-05-12 NOTE — Progress Notes (Signed)
EEG completed, results pending. 

## 2015-05-12 NOTE — Consult Note (Signed)
Physical Medicine and Rehabilitation Consult Reason for Consult: Acute nonhemorrhagic infarct right caudate, parasagittal distribution within the right frontal lobe and anterior aspect of the right parietal lobe. Referring Physician: [facility]   HPI: [name] is a 74 y.o. right handed female with history of atrial fibrillation maintained on aspirin 81 mg daily, diabetes mellitus, hypertension, TIA. Patient lives with her daughter and used a cane and sometimes a walker prior to admission. Presented 05/10/2015 with left-sided weakness. MRI of the brain showed acute nonhemorrhagic infarct right caudate, acute nonhemorrhagic infarct parasagittal distribution within the right frontal lobe and anterior aspect of the right parietal lobe. Remote anterior right frontal lobe/ genu of the corpus callosum. Remote small basal ganglia infarcts bilaterally. Moderate small vessel disease type changes. MRA of the head with high-grade stenosis proximal right middle cerebral artery branches just beyond the bifurcation. Carotid Dopplers with no ICA stenosis. EEG and echocardiogram are pending. Patient did not receive TPA. Neurology consulted Presley maintain on aspirin 325 mg daily for CVA prophylaxis. Regular consistency diet. Physical therapy evaluation completed 05/12/2015 with recommendations of physical medicine rehabilitation consult.   Review of Systems  Constitutional: Negative for fever and chills.  HENT: Positive for hearing loss.   Genitourinary:       Vaginal bleeding, urge incontinence  Musculoskeletal: Positive for myalgias, back pain and joint pain.  Neurological: Positive for dizziness, sensory change and weakness. Negative for headaches.   Past Medical History  Diagnosis Date  . Diabetes mellitus   . Asthma   . Chronic knee pain   . Back pain, chronic   . Hypertension   . Vertigo   . Hyperlipidemia   . TIA (transient ischemic attack)   . Anemia   . HOH (hard of hearing)   .  Arthritis   . Obesity   . PMB (postmenopausal bleeding) 11/26/2014  . Urge incontinence 11/26/2014  . Thickened endometrium 01/15/2015  . Dysrhythmia   . Stroke     Mini Stroke   Past Surgical History  Procedure Laterality Date  . Appendectomy    . Ovary surgery    . Cataract extraction w/phaco Left 01/16/2013    Procedure: CATARACT EXTRACTION PHACO AND INTRAOCULAR LENS PLACEMENT (IOC);  Surgeon: Elta Guadeloupe T. Gershon Crane, MD;  Location: [facility];  Service: Ophthalmology;  Laterality: Left;  CDE:14.37  . Endometrial biopsy  04/03/2013       . Hysteroscopy w/d&c N/A 04/24/2013    Procedure: SUCTION DILATATION AND CURETTAGE /HYSTEROSCOPY;  Surgeon: Jonnie Kind, MD;  Location: [facility];  Service: Gynecology;  Laterality: N/A;  . Knee arthroscopy with medial menisectomy Left 07/26/2013    Procedure: KNEE ARTHROSCOPY WITH PARTIAL MEDIAL MENISECTOMY;  Surgeon: Sanjuana Kava, MD;  Location: [facility];  Service: Orthopedics;  Laterality: Left;  . Endometrial ablation    . Colonoscopy  2009    Dr. Wilford Corner: 4 hyperplastic polyps removed. Small internal hemorrhoids. Recommended 5 year follow-up colonoscopy.  . Colonoscopy N/A 02/24/2015    Procedure: COLONOSCOPY;  Surgeon: Danie Binder, MD;  Location: [facility];  Service: Endoscopy;  Laterality: N/A;  1030  . Hysteroscopy w/d&c N/A 04/02/2015    Procedure: UTERINE CURETTAGE, HYSTEROSCOPY;  Surgeon: Florian Buff, MD;  Location: [facility];  Service: Gynecology;  Laterality: N/A;   Family History  Problem Relation Age of Onset  . Arrhythmia Father     Atrial fib/Pacemaker  . Heart failure Mother   . Diabetes Mother   . Other Mother  fell and broke hip  . Diabetes Sister   . Lupus Daughter   . Mental illness Son   . ADD / ADHD Son   . Other Son     soft bones  . Diabetes Maternal Grandmother   . Stroke Paternal Grandmother   . Arthritis Paternal Grandfather   . Other Sister     MVA  . Hypertension Brother   . Diabetes Sister   .  Hypertension Sister   . Colon cancer Neg Hx    Social History:  reports that she has quit smoking. Her smoking use included Cigarettes. She has a 7.5 pack-year smoking history. She has never used smokeless tobacco. She reports that she does not drink alcohol or use illicit drugs. Allergies:  Allergies  Allergen Reactions  . Imitrex [Sumatriptan] Other (See Comments)    Unknown Reaction   . Mango Flavor     Nausea and vomiting  . Oysters [Shellfish Allergy]     Nausea and vomiting   Medications Prior to Admission  Medication Sig Dispense Refill  . acetaminophen (TYLENOL) 325 MG tablet Take 650 mg by mouth every 6 (six) hours as needed for mild pain.     Marland Kitchen aspirin 81 MG tablet Take 81 mg by mouth daily.    Marland Kitchen CARTIA XT 120 MG 24 hr capsule Take 120 mg by mouth daily.     . cholecalciferol (VITAMIN D) 1000 UNITS tablet Take 1,000 Units by mouth every morning.     . hydrOXYzine (VISTARIL) 25 MG capsule Take 4 capsules (100 mg total) by mouth 3 (three) times daily as needed for itching. 60 capsule 0  . Linaclotide (LINZESS) 145 MCG CAPS capsule 1 PO 30 mins prior to your first meal (Patient taking differently: Take 145 mcg by mouth daily. ) 30 capsule 11  . medroxyPROGESTERone (PROVERA) 10 MG tablet Take 1 tablet (10 mg total) by mouth daily. 30 tablet 11  . naproxen sodium (ALEVE) 220 MG tablet Take 220 mg by mouth 2 (two) times daily as needed (for pain).     . simvastatin (ZOCOR) 20 MG tablet Take 20 mg by mouth at bedtime.     . TRADJENTA 5 MG TABS tablet Take 5 mg by mouth daily.   0  . TOVIAZ 4 MG TB24 tablet Take 1 tablet by mouth daily.    . [DISCONTINUED] HYDROcodone-acetaminophen (NORCO/VICODIN) 5-325 MG per tablet Take 1 tablet by mouth every 6 (six) hours as needed. (Patient not taking: Reported on 05/10/2015) 30 tablet 0  . [DISCONTINUED] ketorolac (TORADOL) 10 MG tablet Take 1 tablet (10 mg total) by mouth every 8 (eight) hours as needed. (Patient not taking: Reported on 04/09/2015)  15 tablet 0  . [DISCONTINUED] ondansetron (ZOFRAN) 8 MG tablet Take 1 tablet (8 mg total) by mouth every 8 (eight) hours as needed for nausea. (Patient not taking: Reported on 04/09/2015) 12 tablet 0  . [DISCONTINUED] valACYclovir (VALTREX) 1000 MG tablet Take 1 tablet (1,000 mg total) by mouth 3 (three) times daily. (Patient not taking: Reported on 04/09/2015) 30 tablet 1    Home: [place] expects to be discharged to:: Inpatient rehab Living Arrangements: Children Available Help at Discharge: Family, Available 24 hours/day Type of Home: House  Functional History: Prior Function Level of Independence: Independent with assistive device(s) Functional Status:  Mobility: Bed Mobility Overal bed mobility: Needs Assistance Bed Mobility: Supine to Sit Supine to sit: Min assist, HOB elevated General bed mobility comments: Sitting towards L side with pt using  bed rails for pulling up to sitting.   Transfers Overall transfer level: Needs assistance Equipment used: 2 person hand held assist Transfers: Sit to/from Stand, Stand Pivot Transfers Sit to Stand: Min assist, +2 physical assistance Stand pivot transfers: Min assist, +2 physical assistance General transfer comment: cues for use of R UE and movements through pivot to chair.  pt able to extend L knee to come to stand, but knee weakness and near buckling noted during transfer.        ADL:    Cognition: Cognition Overall Cognitive Status: Within Functional Limits for tasks assessed Arousal/Alertness: Awake/alert Orientation Level: Oriented X4 Attention: Sustained Sustained Attention: Appears intact Memory: Appears intact Safety/Judgment: Appears intact Cognition Arousal/Alertness: Awake/alert Behavior During Therapy: WFL for tasks assessed/performed Overall Cognitive Status: Within Functional Limits for tasks assessed  Blood pressure 159/79, pulse 83, temperature 98.3 F (36.8 C), temperature source Oral, resp.  rate 21, height '4\' 11"'$  (1.499 m), weight 131.4 kg (289 lb 11 oz), last menstrual period 03/31/2013, SpO2 96 %. Physical Exam  Constitutional: She is oriented to person, place, and time.  74 year old obese female  HENT:  Head: Normocephalic.  Eyes: EOM are normal.  Neck: Normal range of motion. Neck supple. No thyromegaly present.  Cardiovascular: Normal rate and regular rhythm.   Respiratory: Effort normal and breath sounds normal. No respiratory distress.  GI: Soft. Bowel sounds are normal. She exhibits no distension.  Neurological: She is alert and oriented to person, place, and time.  Skin: Skin is warm and dry.  Motor strength is 5/5 in the right deltoid, biceps, triceps, grip, right hip flexor and knee extensor and ankle dorsiflexor plantar flexion 0/5 in the left deltoid, biceps, triceps, grip 3/5 in the left hip flexor and extensor ankle dorsiflexor and plantar flexor Sensation reduced to light touch in left upper extremity Sensation intact to light touch in the left lower extremity as well as on the right side Tone is reduced in the left upper extremity and normal in the left lower extremity  Results for orders placed or performed during the hospital encounter of 05/10/15 (from the past 24 hour(s))  CBC     Status: Abnormal   Collection Time: 05/11/15  1:48 PM  Result Value Ref Range   WBC 3.8 (L) 4.0 - 10.5 K/uL   RBC 3.57 (L) 3.87 - 5.11 MIL/uL   Hemoglobin 11.5 (L) 12.0 - 15.0 g/dL   HCT 35.3 (L) 36.0 - 46.0 %   MCV 98.9 78.0 - 100.0 fL   MCH 32.2 26.0 - 34.0 pg   MCHC 32.6 30.0 - 36.0 g/dL   RDW 13.5 11.5 - 15.5 %   Platelets 184 150 - 400 K/uL  Glucose, capillary     Status: Abnormal   Collection Time: 05/11/15  4:11 PM  Result Value Ref Range   Glucose-Capillary 118 (H) 65 - 99 mg/dL  Glucose, capillary     Status: None   Collection Time: 05/11/15  9:28 PM  Result Value Ref Range   Glucose-Capillary 87 65 - 99 mg/dL  CBC     Status: Abnormal   Collection Time:  05/12/15  3:10 AM  Result Value Ref Range   WBC 4.0 4.0 - 10.5 K/uL   RBC 3.50 (L) 3.87 - 5.11 MIL/uL   Hemoglobin 11.4 (L) 12.0 - 15.0 g/dL   HCT 34.7 (L) 36.0 - 46.0 %   MCV 99.1 78.0 - 100.0 fL   MCH 32.6 26.0 - 34.0 pg   MCHC 32.9  30.0 - 36.0 g/dL   RDW 13.5 11.5 - 15.5 %   Platelets 173 150 - 400 K/uL  Basic metabolic panel     Status: Abnormal   Collection Time: 05/12/15  3:10 AM  Result Value Ref Range   Sodium 139 135 - 145 mmol/L   Potassium 4.2 3.5 - 5.1 mmol/L   Chloride 111 101 - 111 mmol/L   CO2 23 22 - 32 mmol/L   Glucose, Bld 110 (H) 65 - 99 mg/dL   BUN 22 (H) 6 - 20 mg/dL   Creatinine, Ser 1.01 (H) 0.44 - 1.00 mg/dL   Calcium 8.3 (L) 8.9 - 10.3 mg/dL   GFR calc non Af Amer 54 (L) >60 mL/min   GFR calc Af Amer >60 >60 mL/min   Anion gap 5 5 - 15  Glucose, capillary     Status: None   Collection Time: 05/12/15  8:10 AM  Result Value Ref Range   Glucose-Capillary 78 65 - 99 mg/dL  Glucose, capillary     Status: None   Collection Time: 05/12/15 11:48 AM  Result Value Ref Range   Glucose-Capillary 93 65 - 99 mg/dL   Dg Chest 2 View  05/10/2015   CLINICAL DATA:  Acute stroke.  Left arm and leg weakness.  EXAM: CHEST  2 VIEW  COMPARISON:  03/31/2013 and 12/15/2011  FINDINGS: Heart size and pulmonary vascularity are normal and the lungs are clear. No acute osseous abnormality. Flowing osteophytes fuse much of the thoracic spine.  IMPRESSION: No active cardiopulmonary disease.   Electronically Signed   By: Lorriane Shire M.D.   On: 05/10/2015 21:37   Mr Brain Wo Contrast  05/10/2015   CLINICAL DATA:  74 year old diabetic hypertensive female with hyperlipidemia and atrial fibrillation presenting with left arm and leg weakness. Subsequent encounter.  EXAM: MRI HEAD WITHOUT CONTRAST  MRA HEAD WITHOUT CONTRAST  TECHNIQUE: Multiplanar, multiecho pulse sequences of the brain and surrounding structures were obtained without intravenous contrast. Angiographic images of the head  were obtained using MRA technique without contrast.  COMPARISON:  05/10/2015 head CT.  07/27/2014 MR.  FINDINGS: MR BRAIN:  Acute nonhemorrhagic infarct right caudate. Acute nonhemorrhagic infarcts in a parasagittal distribution within the right frontal lobe and anterior aspect of the right parietal lobe.  Remote anterior right frontal lobe/genu of the corpus callosum infarct with encephalomalacia and minimal amount of blood breakdown products in the adjacent right genu of the corpus callosum. Remote small basal ganglia infarcts bilaterally.  Moderate small vessel disease type changes.  Mild atrophy without hydrocephalus.  No intracranial mass lesion noted on this unenhanced exam.  Partial opacification mastoid air cells greater on right. No obstructing lesion of the eustachian tube noted. Paranasal sinus mucosal thickening.  Post lens replacement otherwise orbital structures unremarkable.  Expanded partially empty sella. This finding and slightly prominent peri optic spaces can be seen in this setting of pseudotumor cerebri. No flattening of the posterior aspect of the globes.  Cervical medullary junction and pineal region unremarkable.  MRA HEAD FINDINGS  Ectatic right internal carotid artery distal cervical segment.  Slight irregularity and ectasia of the cavernous segment of the internal carotid artery bilaterally.  High-grade stenosis of proximal right middle cerebral artery branches just beyond the bifurcation. Decrease number of visualized right middle cerebral artery branches consistent with patient's acute infarct.  High-grade stenosis distal A1 segment right anterior cerebral artery. Markedly narrowed and irregular A2 segment anterior cerebral artery more notable on the right.  Fetal type  contribution to the left posterior cerebral artery.  Mild to moderate narrowing left middle cerebral artery branch vessels.  No significant stenosis of the distal vertebral arteries or basilar artery.  Nonvisualized  posterior inferior cerebellar arteries.  Moderate narrowing proximal left anterior inferior cerebellar artery.  Ectatic proximal aspect of the left superior cerebellar artery without saccular aneurysm.  Mild prominence distal basilar artery without saccular aneurysm.  Posterior cerebral artery mild moderate distal branch vessel irregularity.  IMPRESSION: MR BRAIN:  Acute nonhemorrhagic infarct right caudate. Acute nonhemorrhagic infarcts in a parasagittal distribution within the right frontal lobe and anterior aspect of the right parietal lobe.  Remote anterior right frontal lobe/genu of the corpus callosum infarct. Remote small basal ganglia infarcts bilaterally.  Moderate small vessel disease type changes.  Partial opacification mastoid air cells greater on right. Paranasal sinus mucosal thickening.  Expanded partially empty sella. This finding and slightly prominent peri optic spaces can be seen in this setting of pseudotumor cerebri. No flattening of the posterior aspect of the globes.  MRA HEAD FINDINGS  High-grade stenosis of proximal right middle cerebral artery branches just beyond the bifurcation. Decrease number of visualized right middle cerebral artery branches consistent with patient's acute infarct.  Please see above for additional findings.   Electronically Signed   By: Genia Del M.D.   On: 05/10/2015 21:22   Mr Jodene Nam Head/brain Wo Cm  05/10/2015   CLINICAL DATA:  74 year old diabetic hypertensive female with hyperlipidemia and atrial fibrillation presenting with left arm and leg weakness. Subsequent encounter.  EXAM: MRI HEAD WITHOUT CONTRAST  MRA HEAD WITHOUT CONTRAST  TECHNIQUE: Multiplanar, multiecho pulse sequences of the brain and surrounding structures were obtained without intravenous contrast. Angiographic images of the head were obtained using MRA technique without contrast.  COMPARISON:  05/10/2015 head CT.  07/27/2014 MR.  FINDINGS: MR BRAIN:  Acute nonhemorrhagic infarct right  caudate. Acute nonhemorrhagic infarcts in a parasagittal distribution within the right frontal lobe and anterior aspect of the right parietal lobe.  Remote anterior right frontal lobe/genu of the corpus callosum infarct with encephalomalacia and minimal amount of blood breakdown products in the adjacent right genu of the corpus callosum. Remote small basal ganglia infarcts bilaterally.  Moderate small vessel disease type changes.  Mild atrophy without hydrocephalus.  No intracranial mass lesion noted on this unenhanced exam.  Partial opacification mastoid air cells greater on right. No obstructing lesion of the eustachian tube noted. Paranasal sinus mucosal thickening.  Post lens replacement otherwise orbital structures unremarkable.  Expanded partially empty sella. This finding and slightly prominent peri optic spaces can be seen in this setting of pseudotumor cerebri. No flattening of the posterior aspect of the globes.  Cervical medullary junction and pineal region unremarkable.  MRA HEAD FINDINGS  Ectatic right internal carotid artery distal cervical segment.  Slight irregularity and ectasia of the cavernous segment of the internal carotid artery bilaterally.  High-grade stenosis of proximal right middle cerebral artery branches just beyond the bifurcation. Decrease number of visualized right middle cerebral artery branches consistent with patient's acute infarct.  High-grade stenosis distal A1 segment right anterior cerebral artery. Markedly narrowed and irregular A2 segment anterior cerebral artery more notable on the right.  Fetal type contribution to the left posterior cerebral artery.  Mild to moderate narrowing left middle cerebral artery branch vessels.  No significant stenosis of the distal vertebral arteries or basilar artery.  Nonvisualized posterior inferior cerebellar arteries.  Moderate narrowing proximal left anterior inferior cerebellar artery.  Ectatic proximal aspect  of the left superior  cerebellar artery without saccular aneurysm.  Mild prominence distal basilar artery without saccular aneurysm.  Posterior cerebral artery mild moderate distal branch vessel irregularity.  IMPRESSION: MR BRAIN:  Acute nonhemorrhagic infarct right caudate. Acute nonhemorrhagic infarcts in a parasagittal distribution within the right frontal lobe and anterior aspect of the right parietal lobe.  Remote anterior right frontal lobe/genu of the corpus callosum infarct. Remote small basal ganglia infarcts bilaterally.  Moderate small vessel disease type changes.  Partial opacification mastoid air cells greater on right. Paranasal sinus mucosal thickening.  Expanded partially empty sella. This finding and slightly prominent peri optic spaces can be seen in this setting of pseudotumor cerebri. No flattening of the posterior aspect of the globes.  MRA HEAD FINDINGS  High-grade stenosis of proximal right middle cerebral artery branches just beyond the bifurcation. Decrease number of visualized right middle cerebral artery branches consistent with patient's acute infarct.  Please see above for additional findings.   Electronically Signed   By: Genia Del M.D.   On: 05/10/2015 21:22    Assessment/Plan: Diagnosis: Left hemiparesis secondary to right caudate and right frontal infarct likely embolic 1. Does the need for close, 24 hr/day medical supervision in concert with the patient's rehab needs make it unreasonable for this patient to be served in a less intensive setting? Yes 2. Co-Morbidities requiring supervision/potential complications: Morbid obesity, uterine bleeding, mild anemia 3. Due to bladder management, bowel management, safety, skin/wound care, disease management, medication administration, pain management and patient education, does the patient require 24 hr/day rehab nursing? Yes 4. Does the patient require coordinated care of a physician, rehab nurse, PT (1-2 hrs/day, 55 days/week) and OT (1-2 hrs/day,  5 days/week) to address physical and functional deficits in the context of the above medical diagnosis(es)? Yes Addressing deficits in the following areas: balance, endurance, locomotion, strength, transferring, bowel/bladder control, bathing, dressing, feeding, grooming and toileting 5. Can the patient actively participate in an intensive therapy program of at least 3 hrs of therapy per day at least 5 days per week? Yes 6. The potential for patient to make measurable gains while on inpatient rehab is excellent 7. Anticipated functional outcomes upon discharge from inpatient rehab are modified independent  with PT, supervision with OT, n/a with SLP. 8. Estimated rehab length of stay to reach the above functional goals is: 10-12 days 9. Does the patient have adequate social supports and living environment to accommodate these discharge functional goals? Yes 10. Anticipated D/C setting: Home 11. Anticipated post D/C treatments: Christmas therapy 12. Overall Rehab/Functional Prognosis: excellent  RECOMMENDATIONS: This patient's condition is appropriate for continued rehabilitative care in the following setting: CIR Patient has agreed to participate in recommended program. Yes Note that insurance prior authorization may be required for reimbursement for recommended care.  Comment: Has sister in [place] that used to be CNA    05/12/2015

## 2015-05-12 NOTE — Progress Notes (Signed)
Rehab Admissions Coordinator Note:  Patient was screened by Cleatrice Burke for appropriateness for an Inpatient Acute Rehab Consult per PT recommendation. At this time, we are recommending Inpatient Rehab consult. I will contact Dr. Algis Liming for order.   Cleatrice Burke 05/12/2015, 12:57 PM  I can be reached at [phone].

## 2015-05-12 NOTE — Progress Notes (Signed)
Utilization review complete. Fynlee Rowlands RN CCM Case Mgmt phone [phone] 

## 2015-05-12 NOTE — Procedures (Addendum)
ELECTROENCEPHALOGRAM REPORT   Patient: [name]      Room #:  Age: 74 y.o.        Sex: female Referring Physician: Dr Drenda Freeze Report Date:  05/12/2015        Interpreting Physician: Hulen Luster  History: [name] is an 74 y.o. female admitted with multiple recurrent TIA type episodes  Medications:  Scheduled: . aspirin  325 mg Oral Daily  . atorvastatin  20 mg Oral q1800  . diltiazem  120 mg Oral Daily  . fesoterodine  4 mg Oral Daily  . insulin aspart  0-15 Units Subcutaneous TID WC  . insulin aspart  0-5 Units Subcutaneous QHS  . Linaclotide  145 mcg Oral Daily  . megestrol  120 mg Oral Daily   Followed by  . [START ON 05/17/2015] megestrol  80 mg Oral Daily   Followed by  . [START ON 05/22/2015] megestrol  40 mg Oral Daily    Conditions of Recording:  This is a 16 channel EEG carried out with the patient in the drowsy state.  Description:  The waking background activity consists of a low voltage, symmetrical, fairly well organized, theta activity,  seen from the parieto-occipital and posterior temporal regions. Frequent posterior alpha activity is noted. Low voltage fast activity, poorly organized, is seen anteriorly and is at times superimposed on more posterior regions.  A mixture of theta and alpha rhythms are seen from the central and temporal regions. No focal slowing or epileptiform activity is noted.   The patient drowses with slowing to irregular, low voltage theta and beta activity.  Normal sleep architecture is not observed.   Hyperventilation was not performed. Intermittent photic stimulation was performed but failed to illicit any change in the tracing.    IMPRESSION: Normal drowsy electroencephalogram. There are no focal lateralizing or epileptiform features.   Jim Like, DO [facility] [phone]  If 7pm- 7am, please page neurology on call as listed in AMION. 05/12/2015, 1:03 PM

## 2015-05-12 NOTE — Progress Notes (Signed)
Pt reports left side of face starting to tingle again, left upper extremity flaccid states it comes and goes, speech clear at present, MD messaged.

## 2015-05-13 ENCOUNTER — Ambulatory Visit (HOSPITAL_COMMUNITY): Payer: Commercial Managed Care - HMO

## 2015-05-13 DIAGNOSIS — N950 Postmenopausal bleeding: Secondary | ICD-10-CM | POA: Insufficient documentation

## 2015-05-13 DIAGNOSIS — I639 Cerebral infarction, unspecified: Secondary | ICD-10-CM | POA: Insufficient documentation

## 2015-05-13 DIAGNOSIS — I6789 Other cerebrovascular disease: Secondary | ICD-10-CM

## 2015-05-13 LAB — CBC
HCT: 32.8 % — ABNORMAL LOW (ref 36.0–46.0)
Hemoglobin: 10.9 g/dL — ABNORMAL LOW (ref 12.0–15.0)
MCH: 32.8 pg (ref 26.0–34.0)
MCHC: 33.2 g/dL (ref 30.0–36.0)
MCV: 98.8 fL (ref 78.0–100.0)
Platelets: 168 10*3/uL (ref 150–400)
RBC: 3.32 MIL/uL — ABNORMAL LOW (ref 3.87–5.11)
RDW: 13.4 % (ref 11.5–15.5)
WBC: 3.7 10*3/uL — ABNORMAL LOW (ref 4.0–10.5)

## 2015-05-13 LAB — HEPARIN LEVEL (UNFRACTIONATED)
Heparin Unfractionated: 0.45 IU/mL (ref 0.30–0.70)
Heparin Unfractionated: 0.76 IU/mL — ABNORMAL HIGH (ref 0.30–0.70)
Heparin Unfractionated: 0.61 IU/mL (ref 0.30–0.70)

## 2015-05-13 LAB — GLUCOSE, CAPILLARY
Glucose-Capillary: 89 mg/dL (ref 65–99)
Glucose-Capillary: 100 mg/dL — ABNORMAL HIGH (ref 65–99)
Glucose-Capillary: 78 mg/dL (ref 65–99)
Glucose-Capillary: 86 mg/dL (ref 65–99)

## 2015-05-13 NOTE — Progress Notes (Signed)
ANTICOAGULATION CONSULT NOTE - Follow Up Consult  Pharmacy Consult for heparin Indication: atrial fibrillation and stroke   Labs:  Recent Labs  05/10/15 1026 05/10/15 1035 05/10/15 1841 05/11/15 1348 05/12/15 0310 05/12/15 1946 05/13/15 0356  HGB 12.6 13.3 11.9* 11.5* 11.4*  --  10.9*  HCT 38.1 39.0 36.7 35.3* 34.7*  --  32.8*  PLT 175  --  184 184 173  --  168  APTT 24  --   --   --   --   --   --   LABPROT 13.2  --   --   --   --   --   --   INR 0.98  --   --   --   --   --   --   HEPARINUNFRC  --   --   --   --   --  0.31 0.45  CREATININE 1.33* 1.30* 1.17*  --  1.01*  --   --       Assessment/Plan:  74yo female remains therapeutic on heparin for Afib though given low goal and level increasing, will continue gtt at current rate and confirm stable with additional level.   Wynona Neat, PharmD, BCPS  05/13/2015,5:51 AM

## 2015-05-13 NOTE — Progress Notes (Addendum)
Physical Therapy Treatment Patient Details Name: [name] MRN: [id number] DOB: 05-Jul-1941 Today's Date: 05/13/2015    History of Present Illness pt presents with R Caudate and R MCA Embolic Infarcts.  pt also with Vaginal bleeding and being followed by GYN in [place].      PT Comments    Patient remains very motivated. Progressing towards physical therapy goals. Min assist for tasks performed today including bed mobility, and transfer training. No volitional movement of LUE noted during these tasks but encouraged to incorporate extremity often. LLE appears more stable without significant buckling of knee during pivot transfer. Patient will continue to benefit from skilled physical therapy services to further improve independence with functional mobility.   Follow Up Recommendations  CIR     Equipment Recommendations  None recommended by PT    Recommendations for Other Services Rehab consult     Precautions / Restrictions Precautions Precautions: Fall Restrictions Weight Bearing Restrictions: No    Mobility  Bed Mobility Overal bed mobility: Needs Assistance Bed Mobility: Supine to Sit     Supine to sit: Min assist;HOB elevated     General bed mobility comments: Min assist for trucal support with HOB elevated and instructions for use of bed rail as able.  Transfers Overall transfer level: Needs assistance Equipment used: Rolling walker (2 wheeled);1 person hand held assist Transfers: Sit to/from [facility] Sit to Stand: Min assist Stand pivot transfers: Min assist       General transfer comment: Min assist for boost to stand and LUE support performed from lowest bed setting with RW, and with hand held assist from recliner. Min assist for walker control with pivot towards chair. Cues for directions with technique, pt showing some difficulty following commands and slightly impulsive with turn, however did not note significant Lt knee buckling.  Took  several pivotal steps towards her right side.  Ambulation/Gait                 Stairs            Wheelchair Mobility    Modified Rankin (Stroke Patients Only) Modified Rankin (Stroke Patients Only) Pre-Morbid Rankin Score: No significant disability Modified Rankin: Moderately severe disability     Balance                                    Cognition Arousal/Alertness: Awake/alert Behavior During Therapy: WFL for tasks assessed/performed Overall Cognitive Status: Within Functional Limits for tasks assessed                      Exercises General Exercises - Lower Extremity Ankle Circles/Pumps: AROM;Both;10 reps;Seated Gluteal Sets: Strengthening;Both;5 reps;Seated Long Arc Quad: Strengthening;Left;10 reps;Seated Mini-Sqauts: Strengthening;Both;10 reps;Standing    General Comments        Pertinent Vitals/Pain Pain Assessment: No/denies pain    Home Living                      Prior Function            PT Goals (current goals can now be found in the care plan section) Acute Rehab PT Goals Patient Stated Goal: Use my arm PT Goal Formulation: With patient Time For Goal Achievement: 05/26/15 Potential to Achieve Goals: Good Progress towards PT goals: Progressing toward goals    Frequency  Min 4X/week    PT Plan Current plan remains appropriate  Co-evaluation             End of Session Equipment Utilized During Treatment: Gait belt;Oxygen (98% on 1.5L supplemental O2) Activity Tolerance: Patient tolerated treatment well Patient left: in chair;with call bell/phone within reach     Time: 8811-0315 PT Time Calculation (min) (ACUTE ONLY): 21 min  Charges:  $Therapeutic Activity: 8-22 mins                    G Codes:      Ellouise Newer 2015-05-19, 5:18 PM Camille Bal [place], [place]

## 2015-05-13 NOTE — Progress Notes (Signed)
I await insurance approval to hopefully admit to inpt rehab tomorrow. I met with pt and discussed inpt rehab. [phone]

## 2015-05-13 NOTE — Progress Notes (Signed)
STROKE TEAM PROGRESS NOTE   HISTORY [name] is a 73 y.o. female with a history of afib, htn, dm, and vaginal bleeding who presents with left-sided weakness that started 6/3 on awakening. She states that she has been working with it and his been getting slightly better, however was still significant and therefore she went to the emergency room at [facility]. Given that there is no neurology there on the weekend [facility]. She states that she is getting better overall, but did have some left facial numbness earlier today. Of note, she has been having vaginal bleeding since her procedure and has been off of her anticoagulation because of this. She does have a history of atrial fibrillation. She has been taking aspirin daily.   SUBJECTIVE (INTERVAL HISTORY) No family at bedside. She stated that her vaginal bleeding much better. Started heparin drip yesterday. No acute neuro changes.  OBJECTIVE Temp:  [98.1 F (36.7 C)-98.8 F (37.1 C)] 98.8 F (37.1 C) (06/07 0854) Pulse Rate:  [71-73] 73 (06/07 0854) Cardiac Rhythm:  [-]  Resp:  [18] 18 (06/07 0400) BP: (121-159)/(65-79) 142/73 mmHg (06/07 0854) SpO2:  [98 %-100 %] 98 % (06/07 0854)   Recent Labs Lab 05/12/15 0810 05/12/15 1148 05/12/15 1631 05/12/15 2109 05/13/15 0732  GLUCAP 78 93 97 93 89    Recent Labs Lab 05/10/15 1026 05/10/15 1035 05/10/15 1841 05/12/15 0310  NA 141 141  --  139  K 5.0 4.9  --  4.2  CL 108 108  --  111  CO2 25  --   --  23  GLUCOSE 116* 114*  --  110*  BUN 27* 26*  --  22*  CREATININE 1.33* 1.30* 1.17* 1.01*  CALCIUM 8.7*  --   --  8.3*    Recent Labs Lab 05/10/15 1026  AST 14*  ALT 12*  ALKPHOS 46  BILITOT 0.7  PROT 6.7  ALBUMIN 3.5    Recent Labs Lab 05/10/15 1026 05/10/15 1035 05/10/15 1841 05/11/15 1348 05/12/15 0310 05/13/15 0356  WBC 4.4  --  4.6 3.8* 4.0 3.7*  NEUTROABS 2.7  --   --   --   --   --   HGB 12.6 13.3 11.9* 11.5* 11.4* 10.9*   HCT 38.1 39.0 36.7 35.3* 34.7* 32.8*  MCV 100.3*  --  98.7 98.9 99.1 98.8  PLT 175  --  184 184 173 168   No results for input(s): CKTOTAL, CKMB, CKMBINDEX, TROPONINI in the last 168 hours.  Recent Labs  05/10/15 1026  LABPROT 13.2  INR 0.98    Recent Labs  05/11/15 0106  COLORURINE RED*  LABSPEC 1.035*  PHURINE 5.0  GLUCOSEU NEGATIVE  HGBUR LARGE*  BILIRUBINUR MODERATE*  KETONESUR 15*  PROTEINUR 100*  UROBILINOGEN 1.0  NITRITE NEGATIVE  LEUKOCYTESUR SMALL*       Component Value Date/Time   CHOL 207* 05/11/2015 0253   TRIG 75 05/11/2015 0253   HDL 44 05/11/2015 0253   CHOLHDL 4.7 05/11/2015 0253   VLDL 15 05/11/2015 0253   LDLCALC 148* 05/11/2015 0253   Lab Results  Component Value Date   HGBA1C 6.1* 05/11/2015      Component Value Date/Time   LABOPIA NONE DETECTED 05/11/2015 0106   COCAINSCRNUR NONE DETECTED 05/11/2015 0106   LABBENZ NONE DETECTED 05/11/2015 0106   AMPHETMU NONE DETECTED 05/11/2015 0106   THCU NONE DETECTED 05/11/2015 0106   LABBARB NONE DETECTED 05/11/2015 0106     Recent  Labs Lab 05/10/15 [place] <5    Dg Chest 2 View 05/10/2015    No active cardiopulmonary disease.    Ct Head Wo Contrast 05/10/2015    1. Degraded examination without definite acute intracranial process.  2. Similar findings of advanced microvascular ischemic disease.   MRI BRAIN:  05/10/2015    Acute nonhemorrhagic infarct right caudate. Acute nonhemorrhagic infarcts in a parasagittal distribution within the right frontal lobe and anterior aspect of the right parietal lobe.  Remote anterior right frontal lobe/genu of the corpus callosum infarct. Remote small basal ganglia infarcts bilaterally.  Moderate small vessel disease type changes.  Partial opacification mastoid air cells greater on right. Paranasal sinus mucosal thickening.  Expanded partially empty sella. This finding and slightly prominent peri optic spaces can be seen in this setting of pseudotumor  cerebri. No flattening of the posterior aspect of the globes.    MRA HEAD  05/10/2015    High-grade stenosis of proximal right middle cerebral artery branches just beyond the bifurcation. Decrease number of visualized right middle cerebral artery branches consistent with patient's acute infarct.  Please see above for additional findings.     2-D echo  Left ventricle: The cavity size was normal. Systolic function wasnormal. The estimated ejection fraction was in the range of 55%to 60%. Wall motion was normal; there were no regional wallmotion abnormalities. Doppler parameters are consistent withabnormal left ventricular relaxation (grade 1 diastolicdysfunction).  Carotid Doppler - Bilateral: 1-39% ICA stenosis. Vertebral artery flow is antegrade.  EEG Normal drowsy electroencephalogram. There are no focal lateralizing or epileptiform features.   PHYSICAL EXAM Temp:  [98.1 F (36.7 C)-98.8 F (37.1 C)] 98.8 F (37.1 C) (06/07 0854) Pulse Rate:  [71-73] 73 (06/07 0854) Resp:  [18] 18 (06/07 0400) BP: (121-159)/(65-79) 142/73 mmHg (06/07 0854) SpO2:  [98 %-100 %] 98 % (06/07 0854)  General - morbid obesity, well developed, in no apparent distress.  Ophthalmologic - Fundi not visualized due to eye movement.  Cardiovascular - irregularly irregular heart rate and rhythm.  Mental Status -  Level of arousal and orientation to time, place, and person were intact. Language including expression, naming, repetition, comprehension was assessed and found intact.  Cranial Nerves II - XII - II - Visual field intact OU. III, IV, VI - Extraocular movements intact. V - Facial sensation intact bilaterally. VII - Facial movement intact bilaterally. VIII - Hearing & vestibular intact bilaterally. X - Palate elevates symmetrically. XI - Chin turning & shoulder shrug intact bilaterally. XII - Tongue protrusion intact.  Motor Strength - The patient's strength was LUE 0/5 proximal distal, LLE  5/5, and RUE and RLE 5/5.Marland Kitchen  Bulk was normal and fasciculations were absent.   Motor Tone - Muscle tone was assessed at the neck and appendages and was normal.  Reflexes - The patient's reflexes were 1+ in all extremities and she had no pathological reflexes.  Sensory - Light touch, temperature/pinprick were assessed and were decreased on the left.    Coordination - The patient had normal movements in the right hand with no ataxia or dysmetria.  Tremor was absent.  Gait and Station - not tested due to safety concerns.   ASSESSMENT/PLAN [name] is a 74 y.o. female with history of atrial fibrillation on aspirin 81 mg daily prior to admission, hypertension, a previous TIA and stroke, hyperlipidemia, diabetes mellitus, and vaginal bleeding presenting with left arm weakness.  She did not receive IV t-PA due to vaginal bleeding history.  Stroke:  Non- Dominant right caudate and right MCA infarcts felt to be embolic secondary to atrial fibrillation. Patient also had right ACA stroke in 07/2014, put on Eliquis which was discontinued due to vaginal bleeding.  Resultant  left upper extremity plegia  MRI - as above.   MRA - high-grade stenosis of the proximal right middle cerebral artery branches.  Carotid Doppler unremarkable  EEG no seizure  2D Echo  No source of embolus   LDL 148, not at goal  HgbA1c 6.1  IV heparin for VTE prophylaxis Diet Carb Modified Fluid consistency:: Thin; Room service appropriate?: Yes  aspirin 81 mg orally every day prior to admission, now on heparin IV. OBGYN physician stated pt is not a candidate for hysterectomy but no contraindication for anticoagulation. Recommend switch from IV heparin to eliquis '5mg'$  bid tomorrow if no significant bleeding.  Ongoing aggressive stroke risk factor management  Therapy recommendations: CIR  Disposition:  Insurance authorization underway for CIR. Admission's coordinator is following.  Atrial  fibrillation  Likely the cause for recurrent stroke  Previously on coumadin and then eliquis, however, discontinued due to post-menopausal vaginal bleeding   For stroke prevention, anticoagulation is needed   OBGYN physician stated pt is not a candidate for hysterectomy but no contraindication for anticoagulation, on heparin drip without bolus.  Recommend switch from IV heparin to eliquis '5mg'$  bid tomorrow if no significant bleeding.  Vaginal bleeding  We recommended hysterectomy to stop bleeding  However, pt's stated that pt is not a candidate for hysterectomy but no contraindication for anticoagulation  Increased megace to '120mg'$  with tapering dose  Close monitor bleeding and CBC  History of stroke  07/2014 right ACA stroke  Was on Coumadin prior, discontinued due to vaginal bleeding  Put on eliquis at the time  However Eliquis was also discontinued due to vaginal bleeding  Recommend switch from IV heparin to eliquis '5mg'$  bid tomorrow if no significant bleeding.  Hypertension  Home meds: Cartia  Stable  Hyperlipidemia  Home meds:  Zocor 20 mg daily resumed in hospital  LDL 148, goal < 70  Increase Zocor to 40 mg daily   Continue statin at discharge  Diabetes  HgbA1c 6.1, goal < 7.0  Controlled  Other Stroke Risk Factors  Advanced age  Cigarette smoker, quit smoking.  Obesity, Body mass index is 58.48 kg/(m^2).   Hx stroke/TIA  Family hx stroke (grandmother)  Other Active Problems   Renal insufficiency    Vaginal bleeding s/p D&C, still has bleeding on admission  Hospital day # 3  Neurology will sign off. Please call with questions. Pt will follow up with Dr. Erlinda Hong at [facility] in about 2 months. Thanks for the consult.  Rosalin Hawking, MD PhD Stroke Neurology 05/13/2015 4:26 PM   To contact Stroke Continuity provider, please refer to [email or web address] After hours, contact General Neurology

## 2015-05-13 NOTE — Progress Notes (Signed)
[facility] for Heparin Indication: atrial fibrillation and stroke  Allergies  Allergen Reactions  . Imitrex [Sumatriptan] Other (See Comments)    Unknown Reaction   . Mango Flavor     Nausea and vomiting  . Oysters [Shellfish Allergy]     Nausea and vomiting    Patient Measurements: Height: '4\' 11"'$  (149.9 cm) Weight: 289 lb 11 oz (131.4 kg) IBW/kg (Calculated) : 43.2 Heparin Dosing Weight: 80 kg  Vital Signs: Temp: 98.8 F (37.1 C) (06/07 0854) Temp Source: Oral (06/07 0854) BP: 142/73 mmHg (06/07 0854) Pulse Rate: 73 (06/07 0854)  Labs:  Recent Labs  05/10/15 1841 05/11/15 1348 05/12/15 0310 05/12/15 1946 05/13/15 0356 05/13/15 1246  HGB 11.9* 11.5* 11.4*  --  10.9*  --   HCT 36.7 35.3* 34.7*  --  32.8*  --   PLT 184 184 173  --  168  --   HEPARINUNFRC  --   --   --  0.31 0.45 0.76*  CREATININE 1.17*  --  1.01*  --   --   --     Estimated Creatinine Clearance: 62.4 mL/min (by C-G formula based on Cr of 1.01).  Assessment: 76 YOF who was previously on Coumadin and then Eliquis, but off anticoagulation prior to admission due to post-menopausal vaginal bleeding. She is now on IV heparin for AFib and new CVA. Heparin level has trended up to 0.76, above goal, on 1000 units/hr. Vaginal bleeding noted to have decreased significantly.  Goal of Therapy:  Heparin level 0.3-0.5 units/ml Monitor platelets by anticoagulation protocol: Yes   Plan:   Decrease heparin drip to 900 units/hr.  Heparin level ~ 6 hrs after decrease.  Heparin level and CBC daily while on heparin.  Expect transition to Eliquis on 6/8 if no increase in vaginal bleeding.  Arty Baumgartner, [place] Pager: [phone] 05/13/2015 2:30 PM

## 2015-05-13 NOTE — Progress Notes (Addendum)
PROGRESS NOTE    [name] PPI:[id number] DOB: 1941/01/14 DOA: 05/10/2015 PCP: Rosita Fire, MD  HPI/Brief narrative 74 y.o. female with a history of afib, htn, dm, vaginal bleeding who presented to [facility] with left-sided weakness that started 6/3 at 4 AM. She states that she has been working with it and his been getting slightly better, however was still significant and therefore she went to the emergency room at [facility]. Given that there is no neurology there on the weekend she was transferred to [facility] cone. Patient has been off Eliquis a few months due to bleeding issues. She is s/p hysteroscopy and uterine curettage by Dr. Tania Ade on 04/02/15 for simple and complex hyperplasia without atypia. Postprocedure she had spotting and was placed on Provera but continues to have spotting or even mild bleeding. She states she changes 3 pull-ups daily. Evaluation at [facility] reveals right MCA/ACA territory infarcts. Neurology/stroke team consulting.   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 fib in the absence of anticoagulation due to vaginal bleed and MCA branch artery occlusions - Was on aspirin 81 MG PTA. Now on aspirin 325 MG daily for secondary stroke prophylaxis. As discussed with stroke M.D., DC aspirin since she is anticoagulated. - Not candidate for TPA due to vaginal bleeding. - MRI brain: Acute right nonhemorrhagic infarcts. MRA brain: High-grade stenosis of the proximal right MCA branches - Carotid Doppler: Unremarkable-per stroke progress note (formal report-pending) - 2-D echo: No embolus source as per stroke progress note. (Formal report-pending) - LDL 148: Home simvastatin substituted for higher dose atorvastatin. -  A1c: 6.1 - ST evaluation: No further needs - PT and OT evaluation: Recommend inpatient rehabilitation consult >possibly to CIR 6/8 - Discussed with Dr. Austin Miles M.D. - Discussed  extensively with patient's primary GYN MD Dr. Elonda Husky on 05/12/15 who recommended starting anticoagulation and suspects no major bleeding issues. Please see below - Started IV heparin infusion without bolus on 05/12/15 afternoon. No increase in vaginal bleeding. Continue IV heparin until 6/8 and if no increase in vaginal bleeding, switched to oral Eliquis and DC to CIR.   Paroxysmal A. fib  - Now in sinus rhythm  - Continue Cardizem  - CHA2DS2-VASc Score: 6 - Anticoagulation management as stated above.   Postmenopausal Vaginal bleeding - s/p hysteroscopy and uterine curettage by Dr. Tania Ade on 04/02/15 for simple and complex hyperplasia without atypia.  - Was on Provera 10 MG daily postprocedure for spotting but continues to have spotting/mild vaginal bleeding - Discussed extensively with patient's primary gynecologist Dr. Tania Ade on 05/12/15 who indicated that patient is a poor surgical candidate for hysterectomy. She does not have a intrauterine device and is not a candidate for same. He recommended increasing Megace to 120 mg daily 5 days, then 80 mg daily 5 days, then continue 40 mg daily. He suggested starting patient on anticoagulation and indicates low risk for heavy vaginal bleeding. - Vaginal bleeding has substantially decreased. - Close outpatient follow-up with GYN post discharge  Essential hypertension - Controlled  Type II DM - Oral hypoglycemics held. - Continue SSI - Controlled  Mild anemia - Follow CBCs in the context of vaginal bleeding. - Mild drop from 11.4 > 10.9. Follow CBC in a.m.  Morbid obesity/Body mass index is 58.48 kg/(m^2).  Hyperlipidemia - Simvastatin was substituted for increased dose Lipitor.  Mild acute on stage II chronic kidney disease - Creatinine improved from 1.3 > 1.17 - Creatinine  has normalized.  DVT prophylaxis: SCDs . Started on IV heparin drip. Code Status: Full Family Communication: Discussed with patient's son Mr. Lena Gores  on 6/7. Disposition Plan: Continue inpatient treatment while on IV heparin drip for additional day. Possible discharge to CIR 05/14/15.  Consultants:  Neurology/stroke service  Rehabilitation M.D.  Procedures:  EEG 05/12/15: IMPRESSION: Normal drowsy electroencephalogram. There are no focal lateralizing or epileptiform features.   Antibiotics:  None   Subjective: Overall feels better. Improved movements in left lower extremity. No change in strength in left upper extremity. Note facial tingling or numbness. As per nursing, vaginal bleeding has significantly decreased to spotting.  Objective: Filed Vitals:   05/12/15 2000 05/13/15 0000 05/13/15 0400 05/13/15 0854  BP: 126/75  126/65 142/73  Pulse: 71  71 73  Temp: 98.7 F (37.1 C) 98.1 F (36.7 C) 98.6 F (37 C) 98.8 F (37.1 C)  TempSrc: Oral Oral Oral Oral  Resp: 18  18   Height:      Weight:      SpO2: 100%  100% 98%    Intake/Output Summary (Last 24 hours) at 05/13/15 1401 Last data filed at 05/13/15 0810  Gross per 24 hour  Intake 459.33 ml  Output    200 ml  Net 259.33 ml   Filed Weights   05/10/15 1018 05/11/15 0400  Weight: 127.914 kg (282 lb) 131.4 kg (289 lb 11 oz)     Exam:  General exam: Morbidly obese female lying comfortably in bed  Respiratory system: Clear. No increased work of breathing. Cardiovascular system: S1 & S2 heard, RRR. No JVD, murmurs, gallops, clicks or pedal edema. Telemetry: PAF with CVR. Gastrointestinal system: Abdomen is nondistended, soft and nontender. Normal bowel sounds heard. Central nervous system: Alert and oriented. Dysarthria has improved/resolved. No facial asymmetry.  Extremities: 5 x 5 power in right extremities.Grade 0 x 5 power in left upper extremity and grade 4 x 5 power in the left lower extremity    Data Reviewed: Basic Metabolic Panel:  Recent Labs Lab 05/10/15 1026 05/10/15 1035 05/10/15 1841 05/12/15 0310  NA 141 141  --  139  K 5.0 4.9  --  4.2    CL 108 108  --  111  CO2 25  --   --  23  GLUCOSE 116* 114*  --  110*  BUN 27* 26*  --  22*  CREATININE 1.33* 1.30* 1.17* 1.01*  CALCIUM 8.7*  --   --  8.3*   Liver Function Tests:  Recent Labs Lab 05/10/15 1026  AST 14*  ALT 12*  ALKPHOS 46  BILITOT 0.7  PROT 6.7  ALBUMIN 3.5   No results for input(s): LIPASE, AMYLASE in the last 168 hours. No results for input(s): AMMONIA in the last 168 hours. CBC:  Recent Labs Lab 05/10/15 1026 05/10/15 1035 05/10/15 1841 05/11/15 1348 05/12/15 0310 05/13/15 0356  WBC 4.4  --  4.6 3.8* 4.0 3.7*  NEUTROABS 2.7  --   --   --   --   --   HGB 12.6 13.3 11.9* 11.5* 11.4* 10.9*  HCT 38.1 39.0 36.7 35.3* 34.7* 32.8*  MCV 100.3*  --  98.7 98.9 99.1 98.8  PLT 175  --  184 184 173 168   Cardiac Enzymes: No results for input(s): CKTOTAL, CKMB, CKMBINDEX, TROPONINI in the last 168 hours. BNP (last 3 results) No results for input(s): PROBNP in the last 8760 hours. CBG:  Recent Labs Lab 05/12/15 1148 05/12/15 1631 05/12/15  2109 05/13/15 0732 05/13/15 1113  GLUCAP 93 97 93 89 100*    Recent Results (from the past 240 hour(s))  MRSA PCR Screening     Status: None   Collection Time: 05/10/15  6:30 PM  Result Value Ref Range Status   MRSA by PCR NEGATIVE NEGATIVE Final    Comment:        The GeneXpert MRSA Assay (FDA approved for NASAL specimens only), is one component of a comprehensive MRSA colonization surveillance program. It is not intended to diagnose MRSA infection nor to guide or monitor treatment for MRSA infections.        Studies: No results found.      Scheduled Meds: . aspirin  325 mg Oral Daily  . atorvastatin  20 mg Oral q1800  . diltiazem  120 mg Oral Daily  . fesoterodine  4 mg Oral Daily  . insulin aspart  0-15 Units Subcutaneous TID WC  . insulin aspart  0-5 Units Subcutaneous QHS  . Linaclotide  145 mcg Oral Daily  . megestrol  120 mg Oral Daily   Followed by  . [START ON 05/17/2015]  megestrol  80 mg Oral Daily   Followed by  . [START ON 05/22/2015] megestrol  40 mg Oral Daily   Continuous Infusions: . heparin 1,000 Units/hr (05/12/15 1404)    Active Problems:   DM (diabetes mellitus), type 2 with complications   OBESITY, MORBID   Essential hypertension, benign   Atrial fibrillation   Hyperlipidemia   Sleep apnea   PMB (postmenopausal bleeding)   Left-sided weakness   CVA (cerebral vascular accident)   Paroxysmal atrial fibrillation   Stroke   Vaginal bleeding    Time spent: 52 minutes    HONGALGI,ANAND, MD, FACP, FHM. [facility] Pager [phone]  If 7PM-7AM, please contact night-coverage [email or web address] Password [email or web address] 05/13/2015, 2:01 PM    LOS: 3 days

## 2015-05-13 NOTE — Progress Notes (Signed)
[place] for Heparin Indication: atrial fibrillation and stroke  Allergies  Allergen Reactions  . Imitrex [Sumatriptan] Other (See Comments)    Unknown Reaction   . Mango Flavor     Nausea and vomiting  . Oysters [Shellfish Allergy]     Nausea and vomiting    Patient Measurements: Height: '4\' 11"'$  (149.9 cm) Weight: 289 lb 11 oz (131.4 kg) IBW/kg (Calculated) : 43.2 Heparin Dosing Weight: 80 kg  Vital Signs: Temp: 98.3 F (36.8 C) (06/07 1451) Temp Source: Oral (06/07 1451) BP: 142/67 mmHg (06/07 1451) Pulse Rate: 56 (06/07 1451)  Labs:  Recent Labs  05/11/15 1348 05/12/15 0310  05/13/15 0356 05/13/15 1246 05/13/15 1955  HGB 11.5* 11.4*  --  10.9*  --   --   HCT 35.3* 34.7*  --  32.8*  --   --   PLT 184 173  --  168  --   --   HEPARINUNFRC  --   --   < > 0.45 0.76* 0.61  CREATININE  --  1.01*  --   --   --   --   < > = values in this interval not displayed.  Estimated Creatinine Clearance: 62.4 mL/min (by C-G formula based on Cr of 1.01).  Assessment: 31 YOF who was previously on Coumadin and then Eliquis, but off anticoagulation prior to admission due to post-menopausal vaginal bleeding. She is now on IV heparin for AFib and new CVA. Heparin level has trended down to 0.61 Vaginal bleeding noted to have decreased significantly.  Goal of Therapy:  Heparin level 0.3-0.5 units/ml Monitor platelets by anticoagulation protocol: Yes   Plan:   Decrease heparin drip to 750 units/hr.  Heparin level and CBC daily while on heparin.  Expect transition to Eliquis on 6/8 if no increase in vaginal bleeding.  Tad Moore, [place] Pager: [phone]  05/13/2015 8:20 PM

## 2015-05-13 NOTE — Progress Notes (Signed)
  Echocardiogram 2D Echocardiogram has been performed.  Darlina Sicilian M 05/13/2015, 2:12 PM

## 2015-05-14 ENCOUNTER — Inpatient Hospital Stay (HOSPITAL_COMMUNITY)
Admission: RE | Admit: 2015-05-14 | Discharge: 2015-05-21 | DRG: 065 | Disposition: A | Discharge status: Home Health | Payer: Commercial Managed Care - HMO | Source: Intra-hospital | Attending: Physical Medicine & Rehabilitation | Admitting: Physical Medicine & Rehabilitation

## 2015-05-14 DIAGNOSIS — E785 Hyperlipidemia, unspecified: Secondary | ICD-10-CM | POA: Diagnosis present

## 2015-05-14 DIAGNOSIS — G8194 Hemiplegia, unspecified affecting left nondominant side: Secondary | ICD-10-CM | POA: Diagnosis present

## 2015-05-14 DIAGNOSIS — N319 Neuromuscular dysfunction of bladder, unspecified: Secondary | ICD-10-CM | POA: Diagnosis present

## 2015-05-14 DIAGNOSIS — I63521 Cerebral infarction due to unspecified occlusion or stenosis of right anterior cerebral artery: Principal | ICD-10-CM | POA: Diagnosis present

## 2015-05-14 DIAGNOSIS — S43002S Unspecified subluxation of left shoulder joint, sequela: Secondary | ICD-10-CM | POA: Diagnosis not present

## 2015-05-14 DIAGNOSIS — I4891 Unspecified atrial fibrillation: Secondary | ICD-10-CM | POA: Diagnosis not present

## 2015-05-14 DIAGNOSIS — G8190 Hemiplegia, unspecified affecting unspecified side: Secondary | ICD-10-CM | POA: Diagnosis not present

## 2015-05-14 DIAGNOSIS — I6340 Cerebral infarction due to embolism of unspecified cerebral artery: Secondary | ICD-10-CM | POA: Diagnosis present

## 2015-05-14 DIAGNOSIS — E1142 Type 2 diabetes mellitus with diabetic polyneuropathy: Secondary | ICD-10-CM | POA: Diagnosis not present

## 2015-05-14 DIAGNOSIS — K5900 Constipation, unspecified: Secondary | ICD-10-CM | POA: Diagnosis not present

## 2015-05-14 DIAGNOSIS — I482 Chronic atrial fibrillation: Secondary | ICD-10-CM | POA: Diagnosis not present

## 2015-05-14 DIAGNOSIS — I63421 Cerebral infarction due to embolism of right anterior cerebral artery: Secondary | ICD-10-CM

## 2015-05-14 DIAGNOSIS — E1140 Type 2 diabetes mellitus with diabetic neuropathy, unspecified: Secondary | ICD-10-CM | POA: Diagnosis not present

## 2015-05-14 LAB — GLUCOSE, CAPILLARY
Glucose-Capillary: 85 mg/dL (ref 65–99)
Glucose-Capillary: 99 mg/dL (ref 65–99)
Glucose-Capillary: 81 mg/dL (ref 65–99)
Glucose-Capillary: 103 mg/dL — ABNORMAL HIGH (ref 65–99)

## 2015-05-14 LAB — CBC
HCT: 31.8 % — ABNORMAL LOW (ref 36.0–46.0)
Hemoglobin: 10.6 g/dL — ABNORMAL LOW (ref 12.0–15.0)
MCH: 32.8 pg (ref 26.0–34.0)
MCHC: 33.3 g/dL (ref 30.0–36.0)
MCV: 98.5 fL (ref 78.0–100.0)
Platelets: 166 10*3/uL (ref 150–400)
RBC: 3.23 MIL/uL — ABNORMAL LOW (ref 3.87–5.11)
RDW: 13.3 % (ref 11.5–15.5)
WBC: 3.6 10*3/uL — ABNORMAL LOW (ref 4.0–10.5)

## 2015-05-14 LAB — HEPARIN LEVEL (UNFRACTIONATED): Heparin Unfractionated: 0.41 IU/mL (ref 0.30–0.70)

## 2015-05-14 MED ORDER — SENNOSIDES-DOCUSATE SODIUM 8.6-50 MG PO TABS
1.0000 | ORAL_TABLET | Freq: Every evening | ORAL | Status: DC | PRN
Start: 2015-05-14 — End: 2015-05-24

## 2015-05-14 MED ORDER — APIXABAN 5 MG PO TABS
5.0000 mg | ORAL_TABLET | Freq: Two times a day (BID) | ORAL | Status: DC
  Administered 2015-05-14: 5 mg via ORAL
  Filled 2015-05-14: qty 1

## 2015-05-14 MED ORDER — MEGESTROL ACETATE 40 MG PO TABS
80.0000 mg | ORAL_TABLET | Freq: Every day | ORAL | Status: AC
Start: 2015-05-17 — End: 2015-05-21

## 2015-05-14 MED ORDER — MEGESTROL ACETATE 40 MG PO TABS
40.0000 mg | ORAL_TABLET | Freq: Every day | ORAL | Status: DC
  Filled 2015-05-14: qty 1

## 2015-05-14 MED ORDER — ONDANSETRON HCL 4 MG PO TABS: 4.0000 mg | ORAL_TABLET | Freq: Four times a day (QID) | ORAL | Status: DC | PRN

## 2015-05-14 MED ORDER — FESOTERODINE FUMARATE ER 4 MG PO TB24
4.0000 mg | ORAL_TABLET | Freq: Every day | ORAL | Status: DC
  Administered 2015-05-15 – 2015-05-19 (×5): 4 mg via ORAL
  Filled 2015-05-14 (×7): qty 1

## 2015-05-14 MED ORDER — ONDANSETRON HCL 4 MG/2ML IJ SOLN: 4.0000 mg | Freq: Four times a day (QID) | INTRAMUSCULAR | Status: DC | PRN

## 2015-05-14 MED ORDER — INSULIN ASPART 100 UNIT/ML ~~LOC~~ SOLN: 0.0000 [IU] | Freq: Three times a day (TID) | SUBCUTANEOUS | Status: DC

## 2015-05-14 MED ORDER — APIXABAN 5 MG PO TABS
5.0000 mg | ORAL_TABLET | Freq: Two times a day (BID) | ORAL | Status: DC
  Administered 2015-05-14 – 2015-05-21 (×14): 5 mg via ORAL
  Filled 2015-05-14 (×17): qty 1

## 2015-05-14 MED ORDER — MEGESTROL ACETATE 40 MG PO TABS: 40.0000 mg | ORAL_TABLET | Freq: Every day | ORAL | Status: DC

## 2015-05-14 MED ORDER — MEGESTROL ACETATE 40 MG PO TABS
80.0000 mg | ORAL_TABLET | Freq: Every day | ORAL | Status: AC
  Administered 2015-05-17 – 2015-05-21 (×5): 80 mg via ORAL
  Filled 2015-05-14 (×5): qty 2

## 2015-05-14 MED ORDER — APIXABAN 5 MG PO TABS: 5.0000 mg | ORAL_TABLET | Freq: Two times a day (BID) | ORAL | Status: DC

## 2015-05-14 MED ORDER — SORBITOL 70 % SOLN: 30.0000 mL | Freq: Every day | Status: DC | PRN

## 2015-05-14 MED ORDER — MEGESTROL ACETATE 40 MG PO TABS
120.0000 mg | ORAL_TABLET | Freq: Every day | ORAL | Status: AC
  Administered 2015-05-15 – 2015-05-16 (×2): 120 mg via ORAL
  Filled 2015-05-14 (×3): qty 3

## 2015-05-14 MED ORDER — ACETAMINOPHEN 325 MG PO TABS
650.0000 mg | ORAL_TABLET | ORAL | Status: DC | PRN
  Administered 2015-05-14 – 2015-05-20 (×8): 650 mg via ORAL
  Filled 2015-05-14 (×8): qty 2

## 2015-05-14 MED ORDER — ATORVASTATIN CALCIUM 20 MG PO TABS
20.0000 mg | ORAL_TABLET | Freq: Every day | ORAL | Status: DC
  Administered 2015-05-15 – 2015-05-20 (×6): 20 mg via ORAL
  Filled 2015-05-14 (×7): qty 1

## 2015-05-14 MED ORDER — LINACLOTIDE 145 MCG PO CAPS
145.0000 ug | ORAL_CAPSULE | Freq: Every day | ORAL | Status: DC
  Administered 2015-05-15 – 2015-05-21 (×7): 145 ug via ORAL
  Filled 2015-05-14 (×8): qty 1

## 2015-05-14 MED ORDER — ATORVASTATIN CALCIUM 20 MG PO TABS: 20.0000 mg | ORAL_TABLET | Freq: Every day | ORAL | Status: DC

## 2015-05-14 MED ORDER — DILTIAZEM HCL ER COATED BEADS 120 MG PO CP24
120.0000 mg | ORAL_CAPSULE | Freq: Every day | ORAL | Status: DC
  Administered 2015-05-15 – 2015-05-21 (×7): 120 mg via ORAL
  Filled 2015-05-14 (×8): qty 1

## 2015-05-14 MED ORDER — MEGESTROL ACETATE 40 MG PO TABS: 120.0000 mg | ORAL_TABLET | Freq: Every day | ORAL | Status: AC

## 2015-05-14 NOTE — Progress Notes (Signed)
[place] for Heparin->Eliquis Indication: atrial fibrillation and stroke  Allergies  Allergen Reactions  . Imitrex [Sumatriptan] Other (See Comments)    Unknown Reaction   . Mango Flavor     Nausea and vomiting  . Oysters [Shellfish Allergy]     Nausea and vomiting    Patient Measurements: Height: '4\' 11"'$  (149.9 cm) Weight: 291 lb 8 oz (132.224 kg) IBW/kg (Calculated) : 43.2 Heparin Dosing Weight: 80 kg  Vital Signs: Temp: 98.9 F (37.2 C) (06/08 0508) Temp Source: Oral (06/08 0508) BP: 149/71 mmHg (06/08 1021) Pulse Rate: 68 (06/08 0508)  Labs:  Recent Labs  05/12/15 0310  05/13/15 0356 05/13/15 1246 05/13/15 1955 05/14/15 0556  HGB 11.4*  --  10.9*  --   --  10.6*  HCT 34.7*  --  32.8*  --   --  31.8*  PLT 173  --  168  --   --  166  HEPARINUNFRC  --   < > 0.45 0.76* 0.61 0.41  CREATININE 1.01*  --   --   --   --   --   < > = values in this interval not displayed.  Estimated Creatinine Clearance: 62.6 mL/min (by C-G formula based on Cr of 1.01).  Assessment: 34 YOF who was previously on Coumadin and then Eliquis, but off anticoagulation prior to admission due to post-menopausal vaginal bleeding.  She is has been on IV heparin for AFib and new CVA since 05/12/15.  Heparin level therapeutic (0.41) today on 750 units/hr. Patient reports vaginal bleeding is much better. On tapering Megace regimen.  To resume Eliquis today; planning transfer to Rehab later today.  Goal of Therapy:  Heparin level 0.3-0.7 units/ml full anticoagulation with Eliquis Monitor platelets by anticoagulation protocol: Yes   Plan:   Eliquis 5 mg PO BID.  To stop IV heparin at the time of first Eliquis dose.  Intermittent CBC.  Follow for any increased vaginal bleeding.  Arty Baumgartner, [place] Pager: [phone] 05/14/2015 10:24 AM

## 2015-05-14 NOTE — Progress Notes (Signed)
Physical Therapy Treatment Patient Details Name: [name] MRN: [id number] DOB: 1941-11-27 Today's Date: 05/14/2015    History of Present Illness pt presents with R Caudate and R MCA Embolic Infarcts.  pt also with Vaginal bleeding and being followed by GYN in [place].      PT Comments    Very hard-working lady! Progressing very well. Beginning to have Lt shoulder pain and emphasized need to support LUE on pillow/at elbow. Likely will benefit from use of a sling during PT (short-term).   Follow Up Recommendations  CIR     Equipment Recommendations  None recommended by PT    Recommendations for Other Services       Precautions / Restrictions Precautions Precautions: Fall Restrictions Weight Bearing Restrictions: No    Mobility  Bed Mobility               General bed mobility comments: in recliner  Transfers Overall transfer level: Needs assistance Equipment used: Straight cane;None Transfers: Sit to/from Stand Sit to Stand: Min guard         General transfer comment: repeated x 4; pt uses very wide base of support to come up and then brings feet closer together; slightly unsteady but no corrective assist needed  Ambulation/Gait Ambulation/Gait assistance: Min assist;+2 safety/equipment Ambulation Distance (Feet): 40 Feet Assistive device: Straight cane Gait Pattern/deviations: Step-through pattern;Decreased stride length;Wide base of support   Gait velocity interpretation: Below normal speed for age/gender General Gait Details: LUE in sling to protect Lt shoulder; slight stagger to Lt twice; pt limited by fatigue (muscular in legs and respiratory)   Stairs            Wheelchair Mobility    Modified Rankin (Stroke Patients Only) Modified Rankin (Stroke Patients Only) Pre-Morbid Rankin Score: No significant disability Modified Rankin: Moderately severe disability     Balance Overall balance assessment: Needs assistance          Standing balance support: Single extremity supported Standing balance-Leahy Scale: Poor Standing balance comment: requires RUE support during weight shifting activities                    Cognition Arousal/Alertness: Awake/alert Behavior During Therapy: WFL for tasks assessed/performed Overall Cognitive Status: Within Functional Limits for tasks assessed                      Exercises General Exercises - Lower Extremity Hip Flexion/Marching: AAROM;Both;10 reps;Standing (steady assist) Other Exercises Other Exercises: forward and backward stepping/weight shifts for balance and increasing stride length Other Exercises: during seated rests, LUE assessed with ++shoulder shrug and +grip and release with Lt hand    General Comments        Pertinent Vitals/Pain 98% on 1L at rest 96% on RA at rest after 4 minutes 97% on RA after ambulation--RN ok with leaving on RA at rest  Pain Assessment: 0-10 Pain Score: 3  Pain Location: Lt shoulder Pain Descriptors / Indicators: Spasm;Cramping Pain Intervention(s): Limited activity within patient's tolerance;Monitored during session;Premedicated before session;Repositioned (Used sheet to sling UE while up walking)    Home Living                      Prior Function            PT Goals (current goals can now be found in the care plan section) Acute Rehab PT Goals Patient Stated Goal: Use my arm Time For Goal Achievement: 05/26/15 Potential to Achieve  Goals: Good Progress towards PT goals: Progressing toward goals    Frequency  Min 4X/week    PT Plan Current plan remains appropriate    Co-evaluation             End of Session Equipment Utilized During Treatment: Other (comment) (makeshift sling for LUE) Activity Tolerance: Patient tolerated treatment well Patient left: in chair;with call bell/phone within reach     Time: 6734-1937 PT Time Calculation (min) (ACUTE ONLY): 34 min  Charges:  $Gait  Training: 8-22 mins $Therapeutic Activity: 8-22 mins                    G Codes:      Liora Myles May 27, 2015, 9:41 AM Pager [phone]

## 2015-05-14 NOTE — Progress Notes (Signed)
I have insurance approval and will be admitting pt to rehab today. I met with pt and her dtr at bedside and they are in agreement. [phone]

## 2015-05-14 NOTE — Care Management Note (Signed)
Case Management Note  Patient Details  Name: [name] MRN: [id number] Date of Birth: 03-21-1941  Subjective/Objective:  Pt admitted for L sided weakness and plan for d/c to inpatient rehab.                  Action/Plan: No further needs from CM at this time.   Expected Discharge Date:                  Expected Discharge Plan:  [place]  In-House Referral:     Discharge planning Services  CM Consult  Post Acute Care Choice:    Choice offered to:     DME Arranged:    DME Agency:     HH Arranged:    [place] Agency:     Status of Service:  Completed, signed off  Medicare Important Message Given:  Yes Date Medicare IM Given:  05/14/15 Medicare IM give by:  Jacqlyn Krauss, RN,BSN Date Additional Medicare IM Given:    Additional Medicare Important Message give by:     If discussed at [place] of Stay Meetings, dates discussed:    Additional Comments:  Bethena Roys, RN 05/14/2015, 3:18 PM

## 2015-05-14 NOTE — Discharge Summary (Addendum)
Physician Discharge Summary  [name] MRN: [id number] DOB/AGE: April 04, 1941 74 y.o.  PCP: Rosita Fire, MD   Admit date: 05/10/2015 Discharge date: 05/14/2015  Discharge Diagnoses:     Active Problems:   DM (diabetes mellitus), type 2 with complications   OBESITY, MORBID   Essential hypertension, benign   Atrial fibrillation   Hyperlipidemia   Sleep apnea   PMB (postmenopausal bleeding)   Left-sided weakness   CVA (cerebral vascular accident)   Paroxysmal atrial fibrillation   Stroke   Vaginal bleeding   Cerebral infarction due to unspecified mechanism   Postmenopausal vaginal bleeding  Follow-up recommendations follow up with Dr. Erlinda Hong at [facility] in about 2 months Follow-up with PCP in 3-4 weeks follow-up with GYN post discharge    Medication List    STOP taking these medications        ALEVE 220 MG tablet  Generic drug:  naproxen sodium     aspirin 81 MG tablet     medroxyPROGESTERone 10 MG tablet  Commonly known as:  PROVERA     simvastatin 20 MG tablet  Commonly known as:  ZOCOR      TAKE these medications        acetaminophen 325 MG tablet  Commonly known as:  TYLENOL  Take 650 mg by mouth every 6 (six) hours as needed for mild pain.     apixaban 5 MG Tabs tablet  Commonly known as:  ELIQUIS  Take 1 tablet (5 mg total) by mouth 2 (two) times daily.     atorvastatin 20 MG tablet  Commonly known as:  LIPITOR  Take 1 tablet (20 mg total) by mouth daily at 6 PM.     CARTIA XT 120 MG 24 hr capsule  Generic drug:  diltiazem  Take 120 mg by mouth daily.     cholecalciferol 1000 UNITS tablet  Commonly known as:  VITAMIN D  Take 1,000 Units by mouth every morning.     hydrOXYzine 25 MG capsule  Commonly known as:  VISTARIL  Take 4 capsules (100 mg total) by mouth 3 (three) times daily as needed for itching.     Linaclotide 145 MCG Caps capsule  Commonly known as:  LINZESS  1 PO 30 mins prior to your first meal     megestrol 40 MG tablet   Commonly known as:  MEGACE  Take 3 tablets (120 mg total) by mouth daily.     megestrol 40 MG tablet  Commonly known as:  MEGACE  Take 2 tablets (80 mg total) by mouth daily.  Start taking on:  05/17/2015     megestrol 40 MG tablet  Commonly known as:  MEGACE  Take 1 tablet (40 mg total) by mouth daily.  Start taking on:  05/22/2015     senna-docusate 8.6-50 MG per tablet  Commonly known as:  Senokot-S  Take 1 tablet by mouth at bedtime as needed for mild constipation.     TOVIAZ 4 MG Tb24 tablet  Generic drug:  fesoterodine  Take 1 tablet by mouth daily.     TRADJENTA 5 MG Tabs tablet  Generic drug:  linagliptin  Take 5 mg by mouth daily.         Discharge Condition: Stable   Disposition: 01-Home or Self Care   Consults:  Neurology    Significant Diagnostic Studies: Dg Chest 2 View  05/10/2015   CLINICAL DATA:  Acute stroke.  Left arm and leg weakness.  EXAM: CHEST  2  VIEW  COMPARISON:  03/31/2013 and 12/15/2011  FINDINGS: Heart size and pulmonary vascularity are normal and the lungs are clear. No acute osseous abnormality. Flowing osteophytes fuse much of the thoracic spine.  IMPRESSION: No active cardiopulmonary disease.   Electronically Signed   By: Lorriane Shire M.D.   On: 05/10/2015 21:37   Ct Head Wo Contrast  05/10/2015   CLINICAL DATA:  Code stroke, left arm weakness  EXAM: CT HEAD WITHOUT CONTRAST  TECHNIQUE: Contiguous axial images were obtained from the base of the skull through the vertex without intravenous contrast.  COMPARISON:  07/26/2014; 03/31/2013; brain MRI - 07/27/2014  FINDINGS: Examination is degraded due to a combination of patient body habitus as well as motion artifact.  Re- demonstrated extensive nearly confluent periventricular hypodensities compatible with microvascular ischemic disease. Given extensive background parenchymal abnormalities, there is no CT evidence of superimposed acute large territory infarct. No intraparenchymal or  extra-axial mass or hemorrhage. Unchanged size a configuration of the ventricles and basilar cisterns. No midline shift. Intracranial atherosclerosis. There is minimal mucosal thickening involving the left frontal and anterior ethmoidal air cells. The remaining paranasal sinuses and mastoid air cells are normally aerated. No air-fluid levels. Post left-sided cataract surgery. Regional soft tissues appear otherwise normal. No displaced calvarial fracture.  IMPRESSION: 1. Degraded examination without definite acute intracranial process. 2. Similar findings of advanced microvascular ischemic disease. Critical Value/emergent results were called by telephone at the time of interpretation on 05/10/2015 at 10:27 am to Dr. Daleen Bo , who verbally acknowledged these results.   Electronically Signed   By: Sandi Mariscal M.D.   On: 05/10/2015 10:29   Mr Brain Wo Contrast  05/10/2015   CLINICAL DATA:  74 year old diabetic hypertensive female with hyperlipidemia and atrial fibrillation presenting with left arm and leg weakness. Subsequent encounter.  EXAM: MRI HEAD WITHOUT CONTRAST  MRA HEAD WITHOUT CONTRAST  TECHNIQUE: Multiplanar, multiecho pulse sequences of the brain and surrounding structures were obtained without intravenous contrast. Angiographic images of the head were obtained using MRA technique without contrast.  COMPARISON:  05/10/2015 head CT.  07/27/2014 MR.  FINDINGS: MR BRAIN:  Acute nonhemorrhagic infarct right caudate. Acute nonhemorrhagic infarcts in a parasagittal distribution within the right frontal lobe and anterior aspect of the right parietal lobe.  Remote anterior right frontal lobe/genu of the corpus callosum infarct with encephalomalacia and minimal amount of blood breakdown products in the adjacent right genu of the corpus callosum. Remote small basal ganglia infarcts bilaterally.  Moderate small vessel disease type changes.  Mild atrophy without hydrocephalus.  No intracranial mass lesion noted on  this unenhanced exam.  Partial opacification mastoid air cells greater on right. No obstructing lesion of the eustachian tube noted. Paranasal sinus mucosal thickening.  Post lens replacement otherwise orbital structures unremarkable.  Expanded partially empty sella. This finding and slightly prominent peri optic spaces can be seen in this setting of pseudotumor cerebri. No flattening of the posterior aspect of the globes.  Cervical medullary junction and pineal region unremarkable.  MRA HEAD FINDINGS  Ectatic right internal carotid artery distal cervical segment.  Slight irregularity and ectasia of the cavernous segment of the internal carotid artery bilaterally.  High-grade stenosis of proximal right middle cerebral artery branches just beyond the bifurcation. Decrease number of visualized right middle cerebral artery branches consistent with patient's acute infarct.  High-grade stenosis distal A1 segment right anterior cerebral artery. Markedly narrowed and irregular A2 segment anterior cerebral artery more notable on the right.  Fetal type contribution  to the left posterior cerebral artery.  Mild to moderate narrowing left middle cerebral artery branch vessels.  No significant stenosis of the distal vertebral arteries or basilar artery.  Nonvisualized posterior inferior cerebellar arteries.  Moderate narrowing proximal left anterior inferior cerebellar artery.  Ectatic proximal aspect of the left superior cerebellar artery without saccular aneurysm.  Mild prominence distal basilar artery without saccular aneurysm.  Posterior cerebral artery mild moderate distal branch vessel irregularity.  IMPRESSION: MR BRAIN:  Acute nonhemorrhagic infarct right caudate. Acute nonhemorrhagic infarcts in a parasagittal distribution within the right frontal lobe and anterior aspect of the right parietal lobe.  Remote anterior right frontal lobe/genu of the corpus callosum infarct. Remote small basal ganglia infarcts bilaterally.   Moderate small vessel disease type changes.  Partial opacification mastoid air cells greater on right. Paranasal sinus mucosal thickening.  Expanded partially empty sella. This finding and slightly prominent peri optic spaces can be seen in this setting of pseudotumor cerebri. No flattening of the posterior aspect of the globes.  MRA HEAD FINDINGS  High-grade stenosis of proximal right middle cerebral artery branches just beyond the bifurcation. Decrease number of visualized right middle cerebral artery branches consistent with patient's acute infarct.  Please see above for additional findings.   Electronically Signed   By: Genia Del M.D.   On: 05/10/2015 21:22   Mr Jodene Nam Head/brain Wo Cm  05/10/2015   CLINICAL DATA:  74 year old diabetic hypertensive female with hyperlipidemia and atrial fibrillation presenting with left arm and leg weakness. Subsequent encounter.  EXAM: MRI HEAD WITHOUT CONTRAST  MRA HEAD WITHOUT CONTRAST  TECHNIQUE: Multiplanar, multiecho pulse sequences of the brain and surrounding structures were obtained without intravenous contrast. Angiographic images of the head were obtained using MRA technique without contrast.  COMPARISON:  05/10/2015 head CT.  07/27/2014 MR.  FINDINGS: MR BRAIN:  Acute nonhemorrhagic infarct right caudate. Acute nonhemorrhagic infarcts in a parasagittal distribution within the right frontal lobe and anterior aspect of the right parietal lobe.  Remote anterior right frontal lobe/genu of the corpus callosum infarct with encephalomalacia and minimal amount of blood breakdown products in the adjacent right genu of the corpus callosum. Remote small basal ganglia infarcts bilaterally.  Moderate small vessel disease type changes.  Mild atrophy without hydrocephalus.  No intracranial mass lesion noted on this unenhanced exam.  Partial opacification mastoid air cells greater on right. No obstructing lesion of the eustachian tube noted. Paranasal sinus mucosal thickening.   Post lens replacement otherwise orbital structures unremarkable.  Expanded partially empty sella. This finding and slightly prominent peri optic spaces can be seen in this setting of pseudotumor cerebri. No flattening of the posterior aspect of the globes.  Cervical medullary junction and pineal region unremarkable.  MRA HEAD FINDINGS  Ectatic right internal carotid artery distal cervical segment.  Slight irregularity and ectasia of the cavernous segment of the internal carotid artery bilaterally.  High-grade stenosis of proximal right middle cerebral artery branches just beyond the bifurcation. Decrease number of visualized right middle cerebral artery branches consistent with patient's acute infarct.  High-grade stenosis distal A1 segment right anterior cerebral artery. Markedly narrowed and irregular A2 segment anterior cerebral artery more notable on the right.  Fetal type contribution to the left posterior cerebral artery.  Mild to moderate narrowing left middle cerebral artery branch vessels.  No significant stenosis of the distal vertebral arteries or basilar artery.  Nonvisualized posterior inferior cerebellar arteries.  Moderate narrowing proximal left anterior inferior cerebellar artery.  Ectatic proximal aspect of  the left superior cerebellar artery without saccular aneurysm.  Mild prominence distal basilar artery without saccular aneurysm.  Posterior cerebral artery mild moderate distal branch vessel irregularity.  IMPRESSION: MR BRAIN:  Acute nonhemorrhagic infarct right caudate. Acute nonhemorrhagic infarcts in a parasagittal distribution within the right frontal lobe and anterior aspect of the right parietal lobe.  Remote anterior right frontal lobe/genu of the corpus callosum infarct. Remote small basal ganglia infarcts bilaterally.  Moderate small vessel disease type changes.  Partial opacification mastoid air cells greater on right. Paranasal sinus mucosal thickening.  Expanded partially empty  sella. This finding and slightly prominent peri optic spaces can be seen in this setting of pseudotumor cerebri. No flattening of the posterior aspect of the globes.  MRA HEAD FINDINGS  High-grade stenosis of proximal right middle cerebral artery branches just beyond the bifurcation. Decrease number of visualized right middle cerebral artery branches consistent with patient's acute infarct.  Please see above for additional findings.   Electronically Signed   By: Genia Del M.D.   On: 05/10/2015 21:22     Microbiology: Recent Results (from the past 240 hour(s))  MRSA PCR Screening     Status: None   Collection Time: 05/10/15  6:30 PM  Result Value Ref Range Status   MRSA by PCR NEGATIVE NEGATIVE Final    Comment:        The GeneXpert MRSA Assay (FDA approved for NASAL specimens only), is one component of a comprehensive MRSA colonization surveillance program. It is not intended to diagnose MRSA infection nor to guide or monitor treatment for MRSA infections.      Labs: Results for orders placed or performed during the hospital encounter of 05/10/15 (from the past 48 hour(s))  Glucose, capillary     Status: None   Collection Time: 05/12/15 11:48 AM  Result Value Ref Range   Glucose-Capillary 93 65 - 99 mg/dL  Glucose, capillary     Status: None   Collection Time: 05/12/15  4:31 PM  Result Value Ref Range   Glucose-Capillary 97 65 - 99 mg/dL  Heparin level (unfractionated)     Status: None   Collection Time: 05/12/15  7:46 PM  Result Value Ref Range   Heparin Unfractionated 0.31 0.30 - 0.70 IU/mL    Comment:        IF HEPARIN RESULTS ARE BELOW EXPECTED VALUES, AND PATIENT DOSAGE HAS BEEN CONFIRMED, SUGGEST FOLLOW UP TESTING OF ANTITHROMBIN III LEVELS.   Glucose, capillary     Status: None   Collection Time: 05/12/15  9:09 PM  Result Value Ref Range   Glucose-Capillary 93 65 - 99 mg/dL  Heparin level (unfractionated)     Status: None   Collection Time: 05/13/15  3:56 AM   Result Value Ref Range   Heparin Unfractionated 0.45 0.30 - 0.70 IU/mL    Comment:        IF HEPARIN RESULTS ARE BELOW EXPECTED VALUES, AND PATIENT DOSAGE HAS BEEN CONFIRMED, SUGGEST FOLLOW UP TESTING OF ANTITHROMBIN III LEVELS.   CBC     Status: Abnormal   Collection Time: 05/13/15  3:56 AM  Result Value Ref Range   WBC 3.7 (L) 4.0 - 10.5 K/uL   RBC 3.32 (L) 3.87 - 5.11 MIL/uL   Hemoglobin 10.9 (L) 12.0 - 15.0 g/dL   HCT 32.8 (L) 36.0 - 46.0 %   MCV 98.8 78.0 - 100.0 fL   MCH 32.8 26.0 - 34.0 pg   MCHC 33.2 30.0 - 36.0 g/dL   RDW  13.4 11.5 - 15.5 %   Platelets 168 150 - 400 K/uL  Glucose, capillary     Status: None   Collection Time: 05/13/15  7:32 AM  Result Value Ref Range   Glucose-Capillary 89 65 - 99 mg/dL  Glucose, capillary     Status: Abnormal   Collection Time: 05/13/15 11:13 AM  Result Value Ref Range   Glucose-Capillary 100 (H) 65 - 99 mg/dL  Heparin level (unfractionated)     Status: Abnormal   Collection Time: 05/13/15 12:46 PM  Result Value Ref Range   Heparin Unfractionated 0.76 (H) 0.30 - 0.70 IU/mL    Comment:        IF HEPARIN RESULTS ARE BELOW EXPECTED VALUES, AND PATIENT DOSAGE HAS BEEN CONFIRMED, SUGGEST FOLLOW UP TESTING OF ANTITHROMBIN III LEVELS.   Glucose, capillary     Status: None   Collection Time: 05/13/15  5:08 PM  Result Value Ref Range   Glucose-Capillary 78 65 - 99 mg/dL  Heparin level (unfractionated)     Status: None   Collection Time: 05/13/15  7:55 PM  Result Value Ref Range   Heparin Unfractionated 0.61 0.30 - 0.70 IU/mL    Comment:        IF HEPARIN RESULTS ARE BELOW EXPECTED VALUES, AND PATIENT DOSAGE HAS BEEN CONFIRMED, SUGGEST FOLLOW UP TESTING OF ANTITHROMBIN III LEVELS.   Glucose, capillary     Status: None   Collection Time: 05/13/15 10:09 PM  Result Value Ref Range   Glucose-Capillary 86 65 - 99 mg/dL  Heparin level (unfractionated)     Status: None   Collection Time: 05/14/15  5:56 AM  Result Value Ref  Range   Heparin Unfractionated 0.41 0.30 - 0.70 IU/mL    Comment:        IF HEPARIN RESULTS ARE BELOW EXPECTED VALUES, AND PATIENT DOSAGE HAS BEEN CONFIRMED, SUGGEST FOLLOW UP TESTING OF ANTITHROMBIN III LEVELS.   CBC     Status: Abnormal   Collection Time: 05/14/15  5:56 AM  Result Value Ref Range   WBC 3.6 (L) 4.0 - 10.5 K/uL   RBC 3.23 (L) 3.87 - 5.11 MIL/uL   Hemoglobin 10.6 (L) 12.0 - 15.0 g/dL   HCT 31.8 (L) 36.0 - 46.0 %   MCV 98.5 78.0 - 100.0 fL   MCH 32.8 26.0 - 34.0 pg   MCHC 33.3 30.0 - 36.0 g/dL   RDW 13.3 11.5 - 15.5 %   Platelets 166 150 - 400 K/uL  Glucose, capillary     Status: None   Collection Time: 05/14/15  7:33 AM  Result Value Ref Range   Glucose-Capillary 85 65 - 99 mg/dL     HPI :75 y.o. right handed female with history of atrial fibrillation maintained on aspirin 81 mg daily, diabetes mellitus, hypertension, right ACA infarct August 2015 and had initially been placed on Eliquis but discontinued due to vaginal bleeding. Patient lives with her daughter and used a cane and sometimes a walker prior to admission. Presented 05/10/2015 with left-sided weakness. MRI of the brain showed acute nonhemorrhagic infarct right caudate, acute nonhemorrhagic infarct parasagittal distribution within the right frontal lobe and anterior aspect of the right parietal lobe. Remote anterior right frontal lobe/ genu of the corpus callosum. Remote small basal ganglia infarcts bilaterally. Moderate small vessel disease type changes. MRA of the head with high-grade stenosis proximal right middle cerebral artery branches just beyond the bifurcation. Carotid Dopplers with no ICA stenosis. . Patient did not receive TPA. Echocardiogram with ejection  fraction of 93% grade 1 diastolic dysfunction without emboli. EEG negative for seizure. Neurology consulted currently maintained on intravenous heparin for stroke prophylaxis in light of history of atrial fibrillation plan Eliquis if no significant  bleeding. Regular consistency diet. Physical therapy evaluation completed 05/12/2015 with recommendations of physical medicine rehabilitation consult  HOSPITAL COURSE:   Right MCA/ACA territory CVA/infarcts with L Hemiparesis - Embolic etiology secondary to A. fib in the absence of anticoagulation due to vaginal bleed and MCA branch artery occlusions - Was on aspirin 81 MG PTA. Now on aspirin 325 MG daily for secondary stroke prophylaxis. As discussed with stroke M.D., DC aspirin since she is anticoagulated. - Not candidate for TPA due to vaginal bleeding. - MRI brain: Acute right nonhemorrhagic infarcts. MRA brain: High-grade stenosis of the proximal right MCA branches - Carotid Doppler: Unremarkable-per stroke progress note (formal report-pending) - 2-D echo: No embolus source as per stroke progress note. (Formal report-pending) - LDL 148: Home simvastatin substituted for higher dose atorvastatin. - A1c: 6.1 - ST evaluation: No further needs - PT and OT evaluation: Recommend inpatient rehabilitation consult >possibly to CIR 6/8 - Discussed with Dr. Austin Miles M.D. - Discussed extensively with patient's primary GYN MD Dr. Elonda Husky on 05/12/15 who recommended starting anticoagulation and suspects no major bleeding issues. Patient has been initiated on eliquis '5mg'$  bid - Started IV heparin infusion without bolus on 05/12/15 afternoon. No increase in vaginal bleeding.   switched to oral Eliquis and DC to CIR.  Paroxysmal A. fib  - Now in sinus rhythm  - Continue Cardizem  - CHA2DS2-VASc Score: 6 - Anticoagulation management as stated above.  Postmenopausal Vaginal bleeding - s/p hysteroscopy and uterine curettage by Dr. Tania Ade on 04/02/15 for simple and complex hyperplasia without atypia.  - Was on Provera 10 MG daily postprocedure for spotting but continues to have spotting/mild vaginal bleeding - Discussed extensively with patient's primary gynecologist Dr. Tania Ade on 05/12/15 who  indicated that patient is a poor surgical candidate for hysterectomy. She does not have a intrauterine device and is not a candidate for same. He recommended increasing Megace to 120 mg daily 5 days, then 80 mg daily 5 days, then continue 40 mg daily. He suggested starting patient on anticoagulation and indicates low risk for heavy vaginal bleeding. - Vaginal bleeding has substantially decreased. - Close outpatient follow-up with GYN post discharge   Essential hypertension - Controlled  Type II DM - Oral hypoglycemics restarted, hemoglobin A1c 6.1 - Continue SSI - Controlled  Mild anemia - Follow CBCs in the context of vaginal bleeding. - Mild drop from 11.4 > 10.9. Follow CBC in a.m.  Morbid obesity/Body mass index is 58.48 kg/(m^2).  Hyperlipidemia - Simvastatin was substituted for increased dose Lipitor.  Mild acute on stage II chronic kidney disease - Creatinine improved from 1.3 > 1.17 - Creatinine has normalized.  DVT prophylaxis: SCDs   Discharge Exam:    Blood pressure 149/71, pulse 68, temperature 98.9 F (37.2 C), temperature source Oral, resp. rate 16, height '4\' 11"'$  (1.499 m), weight 132.224 kg (291 lb 8 oz), last menstrual period 03/31/2013, SpO2 97 %.  General exam: Morbidly obese female lying comfortably in bed  Respiratory system: Clear. No increased work of breathing. Cardiovascular system: S1 & S2 heard, RRR. No JVD, murmurs, gallops, clicks or pedal edema. Telemetry: PAF with CVR. Gastrointestinal system: Abdomen is nondistended, soft and nontender. Normal bowel sounds heard. Central nervous system: Alert and oriented. Dysarthria has improved/resolved. No facial asymmetry.  Extremities:  5 x 5 power in right extremities.Grade 0 x 5 power in left upper extremity and grade 4 x 5 power in the left lower extremity        Discharge Instructions    Ambulatory referral to Neurology    Complete by:  As directed   Pt will follow up with Dr. Erlinda Hong at [facility] in about  2 months. Thanks.     Diet - low sodium heart healthy    Complete by:  As directed      Increase activity slowly    Complete by:  As directed            Follow-up Information    Follow up with Xu,Jindong, MD. Schedule an appointment as soon as possible for a visit in 2 months.   Specialty:  Neurology   Why:  stroke clinic   Contact information:   [address] [place] [address] [phone]       Signed: Reyne Dumas 05/14/2015, 10:42 AM

## 2015-05-14 NOTE — Plan of Care (Signed)
Problem: Discharge/Transitional Outcomes Goal: Barriers To Progression Addressed/Resolved Outcome: Completed/Met Date Met:  05/14/15 Discharging to inpatient rehab

## 2015-05-14 NOTE — Progress Notes (Signed)
PMR Admission Coordinator Pre-Admission Assessment  Patient: [name] is an 74 y.o., female MRN: [id number] DOB: [birth year] Height: '4\' 11"'$  (149.9 cm) Weight: 132.224 kg (291 lb 8 oz)  Insurance Information HMO: yes PPO: PCP: IPA: 80/20: OTHER: medicare replacement  PRIMARY: [employer] Silverback Policy#: [id number] Subscriber: pt CM Name: Drema Halon Phone#: [phone] Fax#: [phone] Pre-Cert#: [id number] Employer: retired approved until 6/14 Benefits: Phone #: [phone] Name: 05/13/15 Eff. Date: 12/06/13 Deduct: none Out of Pocket Max: $5500 Life Max: none CIR: $275 per day days 1-7 then covers 100% SNF: no copay days 1-20; $160 coapy per day days 21-100 Outpatient: $40 copay per visit Co-Pay: no visit limit Home Health: 100% Co-Pay: no visit limit DME: 80% Co-Pay: 20% Providers: in network  SECONDARY: none   Medicaid Application Date: Case Manager:  Disability Application Date: Case Worker:   Emergency Contact Information Contact Information    Name Relation Home Work Mobile   [place] Daughter [phone]  [phone]   [place], Kluck [phone]  [phone]   Colvin Caroli [phone]  [phone]     Current Medical History Left hemiparesis secondary to right caudate and right frontal infarct likely embolic History of Present Illness:[name] is a 74 y.o. right handed female with history of atrial fibrillation maintained on aspirin 81 mg daily, diabetes mellitus, hypertension, right ACA infarct August 2015 and had initially been placed on Eliquis but discontinued due to vaginal bleeding. Patient lives with her daughter and used a cane and sometimes a walker prior to  admission.   Presented 05/10/2015 with left-sided weakness. MRI of the brain showed acute nonhemorrhagic infarct right caudate, acute nonhemorrhagic infarct parasagittal distribution within the right frontal lobe and anterior aspect of the right parietal lobe. Remote anterior right frontal lobe/ genu of the corpus callosum. Remote small basal ganglia infarcts bilaterally. Moderate small vessel disease type changes. MRA of the head with high-grade stenosis proximal right middle cerebral artery branches just beyond the bifurcation. Carotid Dopplers with no ICA stenosis. . Patient did not receive TPA. Echocardiogram with ejection fraction of 18% grade 1 diastolic dysfunction without emboli. EEG negative for seizure. Neurology consulted currently maintained on intravenous heparin for stroke prophylaxis and transitioned to Eliquis on 05/14/15. Regular consistency diet.   Total: 4 NIH    Past Medical History  Past Medical History  Diagnosis Date  . Diabetes mellitus   . Asthma   . Chronic knee pain   . Back pain, chronic   . Hypertension   . Vertigo   . Hyperlipidemia   . TIA (transient ischemic attack)   . Anemia   . HOH (hard of hearing)   . Arthritis   . Obesity   . PMB (postmenopausal bleeding) 11/26/2014  . Urge incontinence 11/26/2014  . Thickened endometrium 01/15/2015  . Dysrhythmia   . Stroke     Mini Stroke    Family History  family history includes ADD / ADHD in her son; Arrhythmia in her father; Arthritis in her paternal grandfather; Diabetes in her maternal grandmother, mother, sister, and sister; Heart failure in her mother; Hypertension in her brother and sister; Lupus in her daughter; Mental illness in her son; Other in her mother, sister, and son; Stroke in her paternal grandmother. There is no history of Colon cancer.  Prior Rehab/Hospitalizations:  Has the patient had major surgery during 100 days prior to  admission? Yes Has had a D and C and colonoscopy   Current Medications   Current facility-administered medications:  . acetaminophen (TYLENOL)  tablet 650 mg, 650 mg, Oral, Q4H PRN, 650 mg at 05/14/15 0903 **OR** [DISCONTINUED] acetaminophen (TYLENOL) suppository 650 mg, 650 mg, Rectal, Q4H PRN, Kathie Dike, MD . apixaban (ELIQUIS) tablet 5 mg, 5 mg, Oral, BID, Skeet Simmer, RPH, 5 mg at 05/14/15 1146 . atorvastatin (LIPITOR) tablet 20 mg, 20 mg, Oral, q1800, Modena Jansky, MD, 20 mg at 05/13/15 1840 . diltiazem (CARDIZEM CD) 24 hr capsule 120 mg, 120 mg, Oral, Daily, Kathie Dike, MD, 120 mg at 05/14/15 1021 . fesoterodine (TOVIAZ) tablet 4 mg, 4 mg, Oral, Daily, Kathie Dike, MD, 4 mg at 05/14/15 1018 . hydrOXYzine (ATARAX/VISTARIL) tablet 50 mg, 50 mg, Oral, TID PRN, Allie Bossier, MD . insulin aspart (novoLOG) injection 0-15 Units, 0-15 Units, Subcutaneous, TID WC, Kathie Dike, MD, 0 Units at 05/11/15 0800 . insulin aspart (novoLOG) injection 0-5 Units, 0-5 Units, Subcutaneous, QHS, Kathie Dike, MD, 0 Units at 05/10/15 2200 . Linaclotide (LINZESS) capsule 145 mcg, 145 mcg, Oral, Daily, Kathie Dike, MD, 145 mcg at 05/14/15 1017 . megestrol (MEGACE) tablet 120 mg, 120 mg, Oral, Daily, 120 mg at 05/14/15 1018 **FOLLOWED BY** [START ON 05/17/2015] megestrol (MEGACE) tablet 80 mg, 80 mg, Oral, Daily **FOLLOWED BY** [START ON 05/22/2015] megestrol (MEGACE) tablet 40 mg, 40 mg, Oral, Daily, Modena Jansky, MD . senna-docusate (Senokot-S) tablet 1 tablet, 1 tablet, Oral, QHS PRN, Kathie Dike, MD  Patients Current Diet: Diet Carb Modified Fluid consistency:: Thin; Room service appropriate?: Yes Diet - low sodium heart healthy  Precautions / Restrictions Precautions Precautions: Fall Restrictions Weight Bearing Restrictions: No   Has the patient had 2 or more falls or a fall with injury in the past year?No [place] once going to church this year on Mother's  day.  Prior Activity Level Used RW and cane depending on her left knee issues. Did not drive. Had lived with dtr and her husband for 2 weeks pta. Dtr is currently moving into a new home which pt will live with her.  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  Home Assistive Devices / Equipment Home Assistive Devices/Equipment: Cane (specify quad or straight), CBG Meter, Dentures (specify type), Walker (specify type)  Prior Device Use: Indicate devices/aids used by the patient prior to current illness, exacerbation or injury? Walker and and cane depending on issues with her knees  Prior Functional Level Prior Function Level of Independence: (used cane or RW dependign on how her l knee was functoning)  Self Care: Did the patient need help bathing, dressing, using the toilet or eating? Independent  Indoor Mobility: Did the patient need assistance with walking from room to room (with or without device)? Independent  Stairs: Did the patient need assistance with internal or external stairs (with or without device)? Independent  Functional Cognition: Did the patient need help planning regular tasks such as shopping or remembering to take medications? Independent  Current Functional Level Cognition  Arousal/Alertness: Awake/alert Overall Cognitive Status: Within Functional Limits for tasks assessed Orientation Level: Oriented X4 Attention: Sustained Sustained Attention: Appears intact Memory: Appears intact Safety/Judgment: Appears intact   Extremity Assessment (includes Sensation/Coordination)  Upper Extremity Assessment: LUE deficits/detail LUE Deficits / Details: can shrug shoulder, otherwise no movement noted in RUE LUE Sensation: decreased light touch LUE Coordination: decreased fine motor, decreased gross motor  Lower Extremity Assessment: Defer to PT evaluation LLE Deficits / Details: Strength grossly 3+/5 with decreased coordination and increased  fatigue.  LLE Coordination: decreased fine motor, decreased gross motor  ADLs  Overall ADL's : Needs assistance/impaired Upper Body Dressing : Moderate assistance, Sitting Lower Body Dressing: Maximal assistance, Sit to/from stand Toilet Transfer: Min guard, Stand-pivot, BSC Toileting- Clothing Manipulation and Hygiene: Maximal assistance, Sit to/from stand Functional mobility during ADLs: Min guard (stand pivot) General ADL Comments: Pt tranferred to [facility] and had BM. Assisted in toilet hygiene and educated on what pt could use for toilet aide. Educated on dressing technique. Educated on being aware of LUE and supporting it on pillow-placed pillow under left arm at end of session.    Mobility  Overal bed mobility: Needs Assistance Bed Mobility: Supine to Sit Rolling: Min guard Supine to sit: Min assist, HOB elevated Sit to supine: Mod assist General bed mobility comments: in recliner    Transfers  Overall transfer level: Needs assistance Equipment used: Straight cane, None Transfers: Sit to/from Stand Sit to Stand: Min guard Stand pivot transfers: Min assist General transfer comment: repeated x 4; pt uses very wide base of support to come up and then brings feet closer together; slightly unsteady but no corrective assist needed    Ambulation / Gait / Stairs / Wheelchair Mobility  Ambulation/Gait Ambulation/Gait assistance: Min assist, +2 safety/equipment Ambulation Distance (Feet): 40 Feet Assistive device: Straight cane General Gait Details: LUE in sling to protect Lt shoulder; slight stagger to Lt twice; pt limited by fatigue (muscular in legs and respiratory) Gait Pattern/deviations: Step-through pattern, Decreased stride length, Wide base of support Gait velocity interpretation: Below normal speed for age/gender    Posture / Balance Balance Overall balance assessment: Needs assistance Sitting-balance support: No upper extremity supported, Feet  supported Sitting balance-Leahy Scale: Fair Standing balance support: Single extremity supported Standing balance-Leahy Scale: Poor Standing balance comment: requires RUE support during weight shifting activities    Special needs/care consideration   Bowel mgmt: LBM 6/7 Bladder mgmt:continent at [facility] Diabetic mgmt yes Vaginal Bleeding monitoring pt reports no bleeding since 05/13/15   Previous Home Environment Living Arrangements: Children, Other (Comment) (moved in with her dtr 3 weeks pta) Lives With: Daughter, Other (Comment) (lives with her dtr and her husband) Available Help at Discharge: Family, Available 24 hours/day, Other (Comment) (dtr works days but may take [employer]) Type of Home: [place]: One level Home Access: Stairs to enter Entrance Stairs-Rails: Right, Left Entrance Stairs-Number of Steps: 3 Bathroom Shower/Tub: Walk-in shower, Other (comment) (and jacuzzi bath in same bathroom) Bathroom Toilet: Standard Bathroom Accessibility: Yes How Accessible: Accessible via walker [place]: No Additional Comments: pt recently 3 weeks ago moved in with dtr and her son inlaw in their new home Pt was living for two weeks with dtr in dtr's old home. Dtr now moving into her new home and plans are for pt to d/c home with dtr and son in law. Dtr can take intermittent FMLA if needed. Does not have 24/7 supervision.  Discharge Living Setting Plans for Discharge Living Setting: Lives with (comment), Other (Comment) (Lives with dtr and son in law 3 weeks pta) Type of Home at Discharge: House Discharge Home Layout: One level Discharge Home Access: Stairs to enter Entrance Stairs-Rails: Right, Left Entrance Stairs-Number of Steps: 3 Discharge Bathroom Shower/Tub: Walk-in shower, Other (comment) (and jacuzzi tub per pt) Discharge Bathroom Toilet: Standard Discharge Bathroom Accessibility: Yes How Accessible: Accessible via walker Does the patient  have any problems obtaining your medications?: No Pt's new room has bathroom across the hall from bedroom with tub/shower combination. Pt can use master bath with walk in shower if  needed.  Social/Family/Support Systems Patient Roles: Parent Contact Information: [name] Anticipated Caregiver: daughter and friends Anticipated Caregiver's Contact Information: see above Ability/Limitations of Caregiver: Dtr works but may take [employer] or they may hire assist Caregiver Availability: 24/7 Discharge Plan Discussed with Primary Caregiver: Yes Is Caregiver In Agreement with Plan?: Yes Does Caregiver/Family have Issues with Lodging/Transportation while Pt is in Rehab?: No  Goals/Additional Needs Patient/Family Goal for Rehab: Mod I with PT, Supervision with OT Expected length of stay: ELOS 10- 12 days Pt/Family Agrees to Admission and willing to participate: Yes Program Orientation Provided & Reviewed with Pt/Caregiver Including Roles & Responsibilities: Yes  Decrease burden of Care through IP rehab admission: n/a  Possible need for SNF placement upon discharge:not anticipated  Patient Condition: This patient's condition remains as documented in the consult dated 05/12/2015 , in which the Rehabilitation Physician determined and documented that the patient's condition is appropriate for intensive rehabilitative care in an inpatient rehabilitation facility. Pt overall min assist functionally. Medical update as listed in History. Will admit to inpatient rehab today.  Preadmission Screen Completed By: Cleatrice Burke, 05/14/2015 4:12 PM ______________________________________________________________________  Discussed status with Dr. Letta Pate on 05/14/15 at 1612 and received telephone approval for admission today.  Admission Coordinator: Cleatrice Burke, time 3225 Date 05/14/2015.          Cosigned by: Charlett Blake, MD at 05/14/2015 4:22 PM  Revision History      Date/Time User Provider Type Action   05/14/2015 4:22 PM Charlett Blake, MD Physician Cosign   05/14/2015 4:12 PM Cristina Gong, RN Rehab Admission Coordinator Sign

## 2015-05-14 NOTE — H&P (Signed)
Physical Medicine and Rehabilitation Admission H&P   Chief Complaint  Patient presents with  . Left sided weakness  : HPI: [name] is a 74 y.o. right handed female with history of atrial fibrillation maintained on aspirin 81 mg daily, diabetes mellitus, hypertension, right ACA infarct August 2015 and had initially been placed on Eliquis but discontinued due to vaginal bleeding. Patient lives with her daughter and used a cane and sometimes a walker prior to admission. Presented 05/10/2015 with left-sided weakness. MRI of the brain showed acute nonhemorrhagic infarct right caudate, acute nonhemorrhagic infarct parasagittal distribution within the right frontal lobe and anterior aspect of the right parietal lobe. Remote anterior right frontal lobe/ genu of the corpus callosum. Remote small basal ganglia infarcts bilaterally. Moderate small vessel disease type changes. MRA of the head with high-grade stenosis proximal right middle cerebral artery branches just beyond the bifurcation. Carotid Dopplers with no ICA stenosis. . Patient did not receive TPA. Echocardiogram with ejection fraction of 05% grade 1 diastolic dysfunction without emboli. EEG negative for seizure. Neurology consulted currently maintained on intravenous heparin for stroke prophylaxis and transitioned to Eliquis.. Regular consistency diet. Physical therapy evaluation completed 05/12/2015 with recommendations of physical medicine rehabilitation consult. Patient was admitted for comprehensive rehabilitation program  Patient states that she can move her left hand better today Patient notes that even prior to her stroke she took a long time to dress herself  Review of Systems  Constitutional: Negative for fever and chills.  HENT: Positive for hearing loss.  Eyes: Negative for double vision.  Respiratory: Negative for cough.   Shortness of breath on exertion  Cardiovascular: Positive for palpitations. Negative for  leg swelling.  Gastrointestinal: Positive for constipation.  Genitourinary: Positive for urgency.   Vaginal bleeding  Musculoskeletal: Positive for myalgias, back pain and joint pain.  Neurological: Positive for headaches. Negative for dizziness.   Past Medical History  Diagnosis Date  . Diabetes mellitus   . Asthma   . Chronic knee pain   . Back pain, chronic   . Hypertension   . Vertigo   . Hyperlipidemia   . TIA (transient ischemic attack)   . Anemia   . HOH (hard of hearing)   . Arthritis   . Obesity   . PMB (postmenopausal bleeding) 11/26/2014  . Urge incontinence 11/26/2014  . Thickened endometrium 01/15/2015  . Dysrhythmia   . Stroke     Mini Stroke   Past Surgical History  Procedure Laterality Date  . Appendectomy    . Ovary surgery    . Cataract extraction w/phaco Left 01/16/2013    Procedure: CATARACT EXTRACTION PHACO AND INTRAOCULAR LENS PLACEMENT (IOC); Surgeon: Elta Guadeloupe T. Gershon Crane, MD; Location: [facility]; Service: Ophthalmology; Laterality: Left; CDE:14.37  . Endometrial biopsy  04/03/2013      . Hysteroscopy w/d&c N/A 04/24/2013    Procedure: SUCTION DILATATION AND CURETTAGE /HYSTEROSCOPY; Surgeon: Jonnie Kind, MD; Location: [facility]; Service: Gynecology; Laterality: N/A;  . Knee arthroscopy with medial menisectomy Left 07/26/2013    Procedure: KNEE ARTHROSCOPY WITH PARTIAL MEDIAL MENISECTOMY; Surgeon: Sanjuana Kava, MD; Location: [facility]; Service: Orthopedics; Laterality: Left;  . Endometrial ablation    . Colonoscopy  2009    Dr. Wilford Corner: 4 hyperplastic polyps removed. Small internal hemorrhoids. Recommended 5 year follow-up colonoscopy.  . Colonoscopy N/A 02/24/2015    Procedure: COLONOSCOPY; Surgeon: Danie Binder, MD; Location: [facility]; Service: Endoscopy; Laterality: N/A; 1030  . Hysteroscopy w/d&c N/A  04/02/2015    Procedure:  UTERINE CURETTAGE, HYSTEROSCOPY; Surgeon: Florian Buff, MD; Location: [facility]; Service: Gynecology; Laterality: N/A;   Family History  Problem Relation Age of Onset  . Arrhythmia Father     Atrial fib/Pacemaker  . Heart failure Mother   . Diabetes Mother   . Other Mother     fell and broke hip  . Diabetes Sister   . Lupus Daughter   . Mental illness Son   . ADD / ADHD Son   . Other Son     soft bones  . Diabetes Maternal Grandmother   . Stroke Paternal Grandmother   . Arthritis Paternal Grandfather   . Other Sister     MVA  . Hypertension Brother   . Diabetes Sister   . Hypertension Sister   . Colon cancer Neg Hx    Social History:  reports that she has quit smoking. Her smoking use included Cigarettes. She has a 7.5 pack-year smoking history. She has never used smokeless tobacco. She reports that she does not drink alcohol or use illicit drugs. Allergies:  Allergies  Allergen Reactions  . Imitrex [Sumatriptan] Other (See Comments)    Unknown Reaction   . Mango Flavor     Nausea and vomiting  . Oysters [Shellfish Allergy]     Nausea and vomiting   Medications Prior to Admission  Medication Sig Dispense Refill  . acetaminophen (TYLENOL) 325 MG tablet Take 650 mg by mouth every 6 (six) hours as needed for mild pain.     Marland Kitchen aspirin 81 MG tablet Take 81 mg by mouth daily.    Marland Kitchen CARTIA XT 120 MG 24 hr capsule Take 120 mg by mouth daily.     . cholecalciferol (VITAMIN D) 1000 UNITS tablet Take 1,000 Units by mouth every morning.     . hydrOXYzine (VISTARIL) 25 MG capsule Take 4 capsules (100 mg total) by mouth 3 (three) times daily as needed for itching. 60 capsule 0  . Linaclotide (LINZESS) 145 MCG CAPS capsule 1 PO 30 mins prior to your first meal (Patient taking differently: Take 145 mcg by mouth daily. ) 30  capsule 11  . medroxyPROGESTERone (PROVERA) 10 MG tablet Take 1 tablet (10 mg total) by mouth daily. 30 tablet 11  . naproxen sodium (ALEVE) 220 MG tablet Take 220 mg by mouth 2 (two) times daily as needed (for pain).     . simvastatin (ZOCOR) 20 MG tablet Take 20 mg by mouth at bedtime.     . TRADJENTA 5 MG TABS tablet Take 5 mg by mouth daily.   0  . TOVIAZ 4 MG TB24 tablet Take 1 tablet by mouth daily.    . [DISCONTINUED] HYDROcodone-acetaminophen (NORCO/VICODIN) 5-325 MG per tablet Take 1 tablet by mouth every 6 (six) hours as needed. (Patient not taking: Reported on 05/10/2015) 30 tablet 0  . [DISCONTINUED] ketorolac (TORADOL) 10 MG tablet Take 1 tablet (10 mg total) by mouth every 8 (eight) hours as needed. (Patient not taking: Reported on 04/09/2015) 15 tablet 0  . [DISCONTINUED] ondansetron (ZOFRAN) 8 MG tablet Take 1 tablet (8 mg total) by mouth every 8 (eight) hours as needed for nausea. (Patient not taking: Reported on 04/09/2015) 12 tablet 0  . [DISCONTINUED] valACYclovir (VALTREX) 1000 MG tablet Take 1 tablet (1,000 mg total) by mouth 3 (three) times daily. (Patient not taking: Reported on 04/09/2015) 30 tablet 1    Home: Home Living Family/patient expects to be discharged to:: Inpatient rehab Living Arrangements: Children Available Help at Discharge: Family,  Available 24 hours/day Type of Home: House  Functional History: Prior Function Level of Independence: Independent with assistive device(s)  Functional Status:  Mobility: Bed Mobility Overal bed mobility: Needs Assistance Bed Mobility: Supine to Sit Rolling: Min guard Supine to sit: Min assist, HOB elevated Sit to supine: Mod assist General bed mobility comments: Min assist for trucal support with HOB elevated and instructions for use of bed rail as able. Transfers Overall transfer level: Needs assistance Equipment used: Rolling walker (2 wheeled), 1 person hand held  assist Transfers: Sit to/from Stand, Stand Pivot Transfers Sit to Stand: Min assist Stand pivot transfers: Min assist General transfer comment: Min assist for boost to stand and LUE support performed from lowest bed setting with RW, and with hand held assist from recliner. Min assist for walker control with pivot towards chair. Cues for directions with technique, pt showing some difficulty following commands and slightly impulsive with turn, however did not note significant Lt knee buckling. Took several pivotal steps towards her right side.      ADL: ADL Overall ADL's : Needs assistance/impaired Upper Body Dressing : Moderate assistance, Sitting Lower Body Dressing: Maximal assistance, Sit to/from stand Toilet Transfer: Min guard, Stand-pivot, BSC Toileting- Clothing Manipulation and Hygiene: Maximal assistance, Sit to/from stand Functional mobility during ADLs: Min guard (stand pivot) General ADL Comments: Pt tranferred to [facility] and had BM. Assisted in toilet hygiene and educated on what pt could use for toilet aide. Educated on dressing technique. Educated on being aware of LUE and supporting it on pillow-placed pillow under left arm at end of session.  Cognition: Cognition Overall Cognitive Status: Within Functional Limits for tasks assessed Arousal/Alertness: Awake/alert Orientation Level: Oriented X4 Attention: Sustained Sustained Attention: Appears intact Memory: Appears intact Safety/Judgment: Appears intact Cognition Arousal/Alertness: Awake/alert Behavior During Therapy: WFL for tasks assessed/performed Overall Cognitive Status: Within Functional Limits for tasks assessed  Physical Exam: Blood pressure 123/59, pulse 68, temperature 98.9 F (37.2 C), temperature source Oral, resp. rate 16, height '4\' 11"'$  (1.499 m), weight 132.224 kg (291 lb 8 oz), last menstrual period 03/31/2013, SpO2 99 %. Physical Exam Constitutional: She is oriented to person, place, and time.   74 year old obese female  HENT:  Head: Normocephalic.  Eyes: EOM are normal.  Neck: Normal range of motion. Neck supple. No thyromegaly present.  Cardiovascular: Normal rate and regular rhythm.  Respiratory: Effort normal and breath sounds normal. No respiratory distress.  GI: Soft. Bowel sounds are normal. She exhibits no distension.  Neurological: She is alert and oriented to person, place, and time.  Skin: Skin is warm and dry.  Motor strength is 5/5 in the right deltoid, biceps, triceps, grip, right hip flexor and knee extensor and ankle dorsiflexor plantar flexion 0/5 in the left deltoid, biceps, triceps, 2 minus grip, 2 minus wrist flexion 3/5 in the left hip flexor and extensor ankle dorsiflexor and plantar flexor Sensation Intact to light touch in left upper extremity Sensation intact to light touch in the left lower extremity as well as on the right side Tone is Increased in the left upper extremity and normal in the left lower extremity    Lab Results Last 48 Hours    Results for orders placed or performed during the hospital encounter of 05/10/15 (from the past 48 hour(s))  Glucose, capillary Status: None   Collection Time: 05/12/15 8:10 AM  Result Value Ref Range   Glucose-Capillary 78 65 - 99 mg/dL  Glucose, capillary Status: None   Collection Time: 05/12/15 11:48 AM  Result Value Ref Range   Glucose-Capillary 93 65 - 99 mg/dL  Glucose, capillary Status: None   Collection Time: 05/12/15 4:31 PM  Result Value Ref Range   Glucose-Capillary 97 65 - 99 mg/dL  Heparin level (unfractionated) Status: None   Collection Time: 05/12/15 7:46 PM  Result Value Ref Range   Heparin Unfractionated 0.31 0.30 - 0.70 IU/mL    Comment:   IF HEPARIN RESULTS ARE BELOW EXPECTED VALUES, AND PATIENT DOSAGE HAS BEEN CONFIRMED, SUGGEST FOLLOW UP TESTING OF ANTITHROMBIN III LEVELS.   Glucose, capillary  Status: None   Collection Time: 05/12/15 9:09 PM  Result Value Ref Range   Glucose-Capillary 93 65 - 99 mg/dL  Heparin level (unfractionated) Status: None   Collection Time: 05/13/15 3:56 AM  Result Value Ref Range   Heparin Unfractionated 0.45 0.30 - 0.70 IU/mL    Comment:   IF HEPARIN RESULTS ARE BELOW EXPECTED VALUES, AND PATIENT DOSAGE HAS BEEN CONFIRMED, SUGGEST FOLLOW UP TESTING OF ANTITHROMBIN III LEVELS.   CBC Status: Abnormal   Collection Time: 05/13/15 3:56 AM  Result Value Ref Range   WBC 3.7 (L) 4.0 - 10.5 K/uL   RBC 3.32 (L) 3.87 - 5.11 MIL/uL   Hemoglobin 10.9 (L) 12.0 - 15.0 g/dL   HCT 32.8 (L) 36.0 - 46.0 %   MCV 98.8 78.0 - 100.0 fL   MCH 32.8 26.0 - 34.0 pg   MCHC 33.2 30.0 - 36.0 g/dL   RDW 13.4 11.5 - 15.5 %   Platelets 168 150 - 400 K/uL  Glucose, capillary Status: None   Collection Time: 05/13/15 7:32 AM  Result Value Ref Range   Glucose-Capillary 89 65 - 99 mg/dL  Glucose, capillary Status: Abnormal   Collection Time: 05/13/15 11:13 AM  Result Value Ref Range   Glucose-Capillary 100 (H) 65 - 99 mg/dL  Heparin level (unfractionated) Status: Abnormal   Collection Time: 05/13/15 12:46 PM  Result Value Ref Range   Heparin Unfractionated 0.76 (H) 0.30 - 0.70 IU/mL    Comment:   IF HEPARIN RESULTS ARE BELOW EXPECTED VALUES, AND PATIENT DOSAGE HAS BEEN CONFIRMED, SUGGEST FOLLOW UP TESTING OF ANTITHROMBIN III LEVELS.   Glucose, capillary Status: None   Collection Time: 05/13/15 5:08 PM  Result Value Ref Range   Glucose-Capillary 78 65 - 99 mg/dL  Heparin level (unfractionated) Status: None   Collection Time: 05/13/15 7:55 PM  Result Value Ref Range   Heparin Unfractionated 0.61 0.30 - 0.70 IU/mL    Comment:   IF HEPARIN RESULTS ARE BELOW EXPECTED VALUES, AND PATIENT DOSAGE  HAS BEEN CONFIRMED, SUGGEST FOLLOW UP TESTING OF ANTITHROMBIN III LEVELS.   Glucose, capillary Status: None   Collection Time: 05/13/15 10:09 PM  Result Value Ref Range   Glucose-Capillary 86 65 - 99 mg/dL      Imaging Results (Last 48 hours)    No results found.       Medical Problem List and Plan: 1. 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 DVT Prophylaxis/Anticoagulation: Intravenous heparin and transitioned to Eliquis. Monitor for any bleeding episodes 3. Pain Management: Tylenol as needed 4. Atrial fibrillation. Continue Cardizem 120 mg daily. Cardiac rate control 5. Neuropsych: This patient is capable of making decisions on her own behalf. 6. Skin/Wound Care: Routine skin checks 7. Fluids/Electrolytes/Nutrition: Routine I&O with follow-up chemistries 8. Diabetes mellitus with peripheral neuropathy. Hemoglobin A1c 6.1. Currently on sliding scale insulin. Check CBGs before meals and at bedtime. Patient onTradjenta 5  mg daily prior to admission. Resume as tolerated 9. History of vaginal bleeding. Monitor 10. Chronic constipation. Continue Linzess 11. Hyperlipidemia. Lipitor  Post Admission Physician Evaluation: Functional deficits secondary to right caudate and right frontal lobe infarction likely embolic. 1. Patient is admitted to receive collaborative, interdisciplinary care between the physiatrist, rehab nursing staff, and therapy team. 2. Patient's level of medical complexity and substantial therapy needs in context of that medical necessity cannot be provided at a lesser intensity of care such as a SNF. 3. Patient has experienced substantial functional loss from his/her baseline which was documented above under the "Functional History" and "Functional Status" headings. Judging by the patient's diagnosis, physical exam, and functional history, the patient has potential for functional progress which  will result in measurable gains while on inpatient rehab. These gains will be of substantial and practical use upon discharge in facilitating mobility and self-care at the household level. 4. Physiatrist will provide 24 hour management of medical needs as well as oversight of the therapy plan/treatment and provide guidance as appropriate regarding the interaction of the two. 5. 24 hour rehab nursing will assist with bladder management, bowel management, safety, skin/wound care, disease management, medication administration, pain management and patient education and help integrate therapy concepts, techniques,education, etc. 6. PT will assess and treat for/with: pre gait, gait training, endurance , safety, equipment, neuromuscular re education. Goals are: Min A. 7. OT will assess and treat for/with: ADLs, Cognitive perceptual skills, Neuromuscular re education, safety, endurance, equipment. Goals are: Min A. Therapy may proceed with showering this patient. 8. SLP will assess and treat for/with: NA. Goals are: NA. 9. Case Management and Social Worker will assess and treat for psychological issues and discharge planning. 10. Team conference will be held weekly to assess progress toward goals and to determine barriers to discharge. 11. Patient will receive at least 3 hours of therapy per day at least 5 days per week. 12. ELOS: 10-14d  13. Prognosis: excellent     Charlett Blake M.D. [facility] FAAPM&R (Sports Med, Neuromuscular Med) Diplomate Am Board of Electrodiagnostic Med  05/14/2015

## 2015-05-14 NOTE — Progress Notes (Addendum)
Received pt. As a transfer from [address].Pt. Was oriented to unit routine.Keep assessing pt. Closely and assessing her needs.Pt. Watch the welcome video,the safety plan was signed.The fall prevention plan was explained and signed.

## 2015-05-14 NOTE — Progress Notes (Signed)
I await insurance approval to admit pt to inpt rehab today. Noted d/c orders. I alerted RN that insurance approval still pending I met with pt at bedside and she is aware that I do not have insurance approval to admit yet. [phone]

## 2015-05-14 NOTE — Progress Notes (Signed)
Physical Medicine and Rehabilitation Consult Reason for Consult: Acute nonhemorrhagic infarct right caudate, parasagittal distribution within the right frontal lobe and anterior aspect of the right parietal lobe. Referring Physician: [facility]   HPI: [name] is a 74 y.o. right handed female with history of atrial fibrillation maintained on aspirin 81 mg daily, diabetes mellitus, hypertension, TIA. Patient lives with her daughter and used a cane and sometimes a walker prior to admission. Presented 05/10/2015 with left-sided weakness. MRI of the brain showed acute nonhemorrhagic infarct right caudate, acute nonhemorrhagic infarct parasagittal distribution within the right frontal lobe and anterior aspect of the right parietal lobe. Remote anterior right frontal lobe/ genu of the corpus callosum. Remote small basal ganglia infarcts bilaterally. Moderate small vessel disease type changes. MRA of the head with high-grade stenosis proximal right middle cerebral artery branches just beyond the bifurcation. Carotid Dopplers with no ICA stenosis. EEG and echocardiogram are pending. Patient did not receive TPA. Neurology consulted Presley maintain on aspirin 325 mg daily for CVA prophylaxis. Regular consistency diet. Physical therapy evaluation completed 05/12/2015 with recommendations of physical medicine rehabilitation consult.   Review of Systems  Constitutional: Negative for fever and chills.  HENT: Positive for hearing loss.  Genitourinary:   Vaginal bleeding, urge incontinence  Musculoskeletal: Positive for myalgias, back pain and joint pain.  Neurological: Positive for dizziness, sensory change and weakness. Negative for headaches.   Past Medical History  Diagnosis Date  . Diabetes mellitus   . Asthma   . Chronic knee pain   . Back pain, chronic   . Hypertension   . Vertigo   . Hyperlipidemia   . TIA (transient ischemic attack)   . Anemia   . HOH  (hard of hearing)   . Arthritis   . Obesity   . PMB (postmenopausal bleeding) 11/26/2014  . Urge incontinence 11/26/2014  . Thickened endometrium 01/15/2015  . Dysrhythmia   . Stroke     Mini Stroke   Past Surgical History  Procedure Laterality Date  . Appendectomy    . Ovary surgery    . Cataract extraction w/phaco Left 01/16/2013    Procedure: CATARACT EXTRACTION PHACO AND INTRAOCULAR LENS PLACEMENT (IOC); Surgeon: Elta Guadeloupe T. Gershon Crane, MD; Location: [facility]; Service: Ophthalmology; Laterality: Left; CDE:14.37  . Endometrial biopsy  04/03/2013      . Hysteroscopy w/d&c N/A 04/24/2013    Procedure: SUCTION DILATATION AND CURETTAGE /HYSTEROSCOPY; Surgeon: Jonnie Kind, MD; Location: [facility]; Service: Gynecology; Laterality: N/A;  . Knee arthroscopy with medial menisectomy Left 07/26/2013    Procedure: KNEE ARTHROSCOPY WITH PARTIAL MEDIAL MENISECTOMY; Surgeon: Sanjuana Kava, MD; Location: [facility]; Service: Orthopedics; Laterality: Left;  . Endometrial ablation    . Colonoscopy  2009    Dr. Wilford Corner: 4 hyperplastic polyps removed. Small internal hemorrhoids. Recommended 5 year follow-up colonoscopy.  . Colonoscopy N/A 02/24/2015    Procedure: COLONOSCOPY; Surgeon: Danie Binder, MD; Location: [facility]; Service: Endoscopy; Laterality: N/A; 1030  . Hysteroscopy w/d&c N/A 04/02/2015    Procedure: UTERINE CURETTAGE, HYSTEROSCOPY; Surgeon: Florian Buff, MD; Location: [facility]; Service: Gynecology; Laterality: N/A;   Family History  Problem Relation Age of Onset  . Arrhythmia Father     Atrial fib/Pacemaker  . Heart failure Mother   . Diabetes Mother   . Other Mother     fell and broke hip  . Diabetes Sister   . Lupus Daughter   . Mental illness Son   . ADD / ADHD Son   .  Other Son     soft bones  . Diabetes Maternal  Grandmother   . Stroke Paternal Grandmother   . Arthritis Paternal Grandfather   . Other Sister     MVA  . Hypertension Brother   . Diabetes Sister   . Hypertension Sister   . Colon cancer Neg Hx    Social History:  reports that she has quit smoking. Her smoking use included Cigarettes. She has a 7.5 pack-year smoking history. She has never used smokeless tobacco. She reports that she does not drink alcohol or use illicit drugs. Allergies:  Allergies  Allergen Reactions  . Imitrex [Sumatriptan] Other (See Comments)    Unknown Reaction   . Mango Flavor     Nausea and vomiting  . Oysters [Shellfish Allergy]     Nausea and vomiting   Medications Prior to Admission  Medication Sig Dispense Refill  . acetaminophen (TYLENOL) 325 MG tablet Take 650 mg by mouth every 6 (six) hours as needed for mild pain.     Marland Kitchen aspirin 81 MG tablet Take 81 mg by mouth daily.    Marland Kitchen CARTIA XT 120 MG 24 hr capsule Take 120 mg by mouth daily.     . cholecalciferol (VITAMIN D) 1000 UNITS tablet Take 1,000 Units by mouth every morning.     . hydrOXYzine (VISTARIL) 25 MG capsule Take 4 capsules (100 mg total) by mouth 3 (three) times daily as needed for itching. 60 capsule 0  . Linaclotide (LINZESS) 145 MCG CAPS capsule 1 PO 30 mins prior to your first meal (Patient taking differently: Take 145 mcg by mouth daily. ) 30 capsule 11  . medroxyPROGESTERone (PROVERA) 10 MG tablet Take 1 tablet (10 mg total) by mouth daily. 30 tablet 11  . naproxen sodium (ALEVE) 220 MG tablet Take 220 mg by mouth 2 (two) times daily as needed (for pain).     . simvastatin (ZOCOR) 20 MG tablet Take 20 mg by mouth at bedtime.     . TRADJENTA 5 MG TABS tablet Take 5 mg by mouth daily.   0  . TOVIAZ 4 MG TB24 tablet Take 1 tablet by mouth daily.    . [DISCONTINUED] HYDROcodone-acetaminophen (NORCO/VICODIN) 5-325 MG per tablet  Take 1 tablet by mouth every 6 (six) hours as needed. (Patient not taking: Reported on 05/10/2015) 30 tablet 0  . [DISCONTINUED] ketorolac (TORADOL) 10 MG tablet Take 1 tablet (10 mg total) by mouth every 8 (eight) hours as needed. (Patient not taking: Reported on 04/09/2015) 15 tablet 0  . [DISCONTINUED] ondansetron (ZOFRAN) 8 MG tablet Take 1 tablet (8 mg total) by mouth every 8 (eight) hours as needed for nausea. (Patient not taking: Reported on 04/09/2015) 12 tablet 0  . [DISCONTINUED] valACYclovir (VALTREX) 1000 MG tablet Take 1 tablet (1,000 mg total) by mouth 3 (three) times daily. (Patient not taking: Reported on 04/09/2015) 30 tablet 1    Home: [place] expects to be discharged to:: Inpatient rehab Living Arrangements: Children Available Help at Discharge: Family, Available 24 hours/day Type of Home: House  Functional History: Prior Function Level of Independence: Independent with assistive device(s) Functional Status:  Mobility: Bed Mobility Overal bed mobility: Needs Assistance Bed Mobility: Supine to Sit Supine to sit: Min assist, HOB elevated General bed mobility comments: Sitting towards L side with pt using bed rails for pulling up to sitting.  Transfers Overall transfer level: Needs assistance Equipment used: 2 person hand held assist Transfers: Sit to/from Stand, [employer] Transfers Sit  to Stand: Min assist, +2 physical assistance Stand pivot transfers: Min assist, +2 physical assistance General transfer comment: cues for use of R UE and movements through pivot to chair. pt able to extend L knee to come to stand, but knee weakness and near buckling noted during transfer.       ADL:    Cognition: Cognition Overall Cognitive Status: Within Functional Limits for tasks assessed Arousal/Alertness: Awake/alert Orientation Level: Oriented X4 Attention: Sustained Sustained Attention: Appears intact Memory: Appears  intact Safety/Judgment: Appears intact Cognition Arousal/Alertness: Awake/alert Behavior During Therapy: WFL for tasks assessed/performed Overall Cognitive Status: Within Functional Limits for tasks assessed  Blood pressure 159/79, pulse 83, temperature 98.3 F (36.8 C), temperature source Oral, resp. rate 21, height '4\' 11"'$  (1.499 m), weight 131.4 kg (289 lb 11 oz), last menstrual period 03/31/2013, SpO2 96 %. Physical Exam  Constitutional: She is oriented to person, place, and time.  74 year old obese female  HENT:  Head: Normocephalic.  Eyes: EOM are normal.  Neck: Normal range of motion. Neck supple. No thyromegaly present.  Cardiovascular: Normal rate and regular rhythm.  Respiratory: Effort normal and breath sounds normal. No respiratory distress.  GI: Soft. Bowel sounds are normal. She exhibits no distension.  Neurological: She is alert and oriented to person, place, and time.  Skin: Skin is warm and dry.  Motor strength is 5/5 in the right deltoid, biceps, triceps, grip, right hip flexor and knee extensor and ankle dorsiflexor plantar flexion 0/5 in the left deltoid, biceps, triceps, grip 3/5 in the left hip flexor and extensor ankle dorsiflexor and plantar flexor Sensation reduced to light touch in left upper extremity Sensation intact to light touch in the left lower extremity as well as on the right side Tone is reduced in the left upper extremity and normal in the left lower extremity   Lab Results Last 24 Hours    Results for orders placed or performed during the hospital encounter of 05/10/15 (from the past 24 hour(s))  CBC Status: Abnormal   Collection Time: 05/11/15 1:48 PM  Result Value Ref Range   WBC 3.8 (L) 4.0 - 10.5 K/uL   RBC 3.57 (L) 3.87 - 5.11 MIL/uL   Hemoglobin 11.5 (L) 12.0 - 15.0 g/dL   HCT 35.3 (L) 36.0 - 46.0 %   MCV 98.9 78.0 - 100.0 fL   MCH 32.2 26.0 - 34.0 pg   MCHC 32.6 30.0 - 36.0 g/dL   RDW  13.5 11.5 - 15.5 %   Platelets 184 150 - 400 K/uL  Glucose, capillary Status: Abnormal   Collection Time: 05/11/15 4:11 PM  Result Value Ref Range   Glucose-Capillary 118 (H) 65 - 99 mg/dL  Glucose, capillary Status: None   Collection Time: 05/11/15 9:28 PM  Result Value Ref Range   Glucose-Capillary 87 65 - 99 mg/dL  CBC Status: Abnormal   Collection Time: 05/12/15 3:10 AM  Result Value Ref Range   WBC 4.0 4.0 - 10.5 K/uL   RBC 3.50 (L) 3.87 - 5.11 MIL/uL   Hemoglobin 11.4 (L) 12.0 - 15.0 g/dL   HCT 34.7 (L) 36.0 - 46.0 %   MCV 99.1 78.0 - 100.0 fL   MCH 32.6 26.0 - 34.0 pg   MCHC 32.9 30.0 - 36.0 g/dL   RDW 13.5 11.5 - 15.5 %   Platelets 173 150 - 400 K/uL  Basic metabolic panel Status: Abnormal   Collection Time: 05/12/15 3:10 AM  Result Value Ref Range   Sodium 139 135 -  145 mmol/L   Potassium 4.2 3.5 - 5.1 mmol/L   Chloride 111 101 - 111 mmol/L   CO2 23 22 - 32 mmol/L   Glucose, Bld 110 (H) 65 - 99 mg/dL   BUN 22 (H) 6 - 20 mg/dL   Creatinine, Ser 1.01 (H) 0.44 - 1.00 mg/dL   Calcium 8.3 (L) 8.9 - 10.3 mg/dL   GFR calc non Af Amer 54 (L) >60 mL/min   GFR calc Af Amer >60 >60 mL/min   Anion gap 5 5 - 15  Glucose, capillary Status: None   Collection Time: 05/12/15 8:10 AM  Result Value Ref Range   Glucose-Capillary 78 65 - 99 mg/dL  Glucose, capillary Status: None   Collection Time: 05/12/15 11:48 AM  Result Value Ref Range   Glucose-Capillary 93 65 - 99 mg/dL      Imaging Results (Last 48 hours)    Dg Chest 2 View  05/10/2015 CLINICAL DATA: Acute stroke. Left arm and leg weakness. EXAM: CHEST 2 VIEW COMPARISON: 03/31/2013 and 12/15/2011 FINDINGS: Heart size and pulmonary vascularity are normal and the lungs are clear. No acute osseous abnormality. Flowing osteophytes fuse much of the thoracic  spine. IMPRESSION: No active cardiopulmonary disease. Electronically Signed By: Lorriane Shire M.D. On: 05/10/2015 21:37   Mr Brain Wo Contrast  05/10/2015 CLINICAL DATA: 74 year old diabetic hypertensive female with hyperlipidemia and atrial fibrillation presenting with left arm and leg weakness. Subsequent encounter. EXAM: MRI HEAD WITHOUT CONTRAST MRA HEAD WITHOUT CONTRAST TECHNIQUE: Multiplanar, multiecho pulse sequences of the brain and surrounding structures were obtained without intravenous contrast. Angiographic images of the head were obtained using MRA technique without contrast. COMPARISON: 05/10/2015 head CT. 07/27/2014 MR. FINDINGS: MR BRAIN: Acute nonhemorrhagic infarct right caudate. Acute nonhemorrhagic infarcts in a parasagittal distribution within the right frontal lobe and anterior aspect of the right parietal lobe. Remote anterior right frontal lobe/genu of the corpus callosum infarct with encephalomalacia and minimal amount of blood breakdown products in the adjacent right genu of the corpus callosum. Remote small basal ganglia infarcts bilaterally. Moderate small vessel disease type changes. Mild atrophy without hydrocephalus. No intracranial mass lesion noted on this unenhanced exam. Partial opacification mastoid air cells greater on right. No obstructing lesion of the eustachian tube noted. Paranasal sinus mucosal thickening. Post lens replacement otherwise orbital structures unremarkable. Expanded partially empty sella. This finding and slightly prominent peri optic spaces can be seen in this setting of pseudotumor cerebri. No flattening of the posterior aspect of the globes. Cervical medullary junction and pineal region unremarkable. MRA HEAD FINDINGS Ectatic right internal carotid artery distal cervical segment. Slight irregularity and ectasia of the cavernous segment of the internal carotid artery bilaterally. High-grade stenosis of proximal right middle  cerebral artery branches just beyond the bifurcation. Decrease number of visualized right middle cerebral artery branches consistent with patient's acute infarct. High-grade stenosis distal A1 segment right anterior cerebral artery. Markedly narrowed and irregular A2 segment anterior cerebral artery more notable on the right. Fetal type contribution to the left posterior cerebral artery. Mild to moderate narrowing left middle cerebral artery branch vessels. No significant stenosis of the distal vertebral arteries or basilar artery. Nonvisualized posterior inferior cerebellar arteries. Moderate narrowing proximal left anterior inferior cerebellar artery. Ectatic proximal aspect of the left superior cerebellar artery without saccular aneurysm. Mild prominence distal basilar artery without saccular aneurysm. Posterior cerebral artery mild moderate distal branch vessel irregularity. IMPRESSION: MR BRAIN: Acute nonhemorrhagic infarct right caudate. Acute nonhemorrhagic infarcts in a parasagittal distribution within the right  frontal lobe and anterior aspect of the right parietal lobe. Remote anterior right frontal lobe/genu of the corpus callosum infarct. Remote small basal ganglia infarcts bilaterally. Moderate small vessel disease type changes. Partial opacification mastoid air cells greater on right. Paranasal sinus mucosal thickening. Expanded partially empty sella. This finding and slightly prominent peri optic spaces can be seen in this setting of pseudotumor cerebri. No flattening of the posterior aspect of the globes. MRA HEAD FINDINGS High-grade stenosis of proximal right middle cerebral artery branches just beyond the bifurcation. Decrease number of visualized right middle cerebral artery branches consistent with patient's acute infarct. Please see above for additional findings. Electronically Signed By: Genia Del M.D. On: 05/10/2015 21:22   Mr Jodene Nam Head/brain Wo Cm  05/10/2015  CLINICAL DATA: 74 year old diabetic hypertensive female with hyperlipidemia and atrial fibrillation presenting with left arm and leg weakness. Subsequent encounter. EXAM: MRI HEAD WITHOUT CONTRAST MRA HEAD WITHOUT CONTRAST TECHNIQUE: Multiplanar, multiecho pulse sequences of the brain and surrounding structures were obtained without intravenous contrast. Angiographic images of the head were obtained using MRA technique without contrast. COMPARISON: 05/10/2015 head CT. 07/27/2014 MR. FINDINGS: MR BRAIN: Acute nonhemorrhagic infarct right caudate. Acute nonhemorrhagic infarcts in a parasagittal distribution within the right frontal lobe and anterior aspect of the right parietal lobe. Remote anterior right frontal lobe/genu of the corpus callosum infarct with encephalomalacia and minimal amount of blood breakdown products in the adjacent right genu of the corpus callosum. Remote small basal ganglia infarcts bilaterally. Moderate small vessel disease type changes. Mild atrophy without hydrocephalus. No intracranial mass lesion noted on this unenhanced exam. Partial opacification mastoid air cells greater on right. No obstructing lesion of the eustachian tube noted. Paranasal sinus mucosal thickening. Post lens replacement otherwise orbital structures unremarkable. Expanded partially empty sella. This finding and slightly prominent peri optic spaces can be seen in this setting of pseudotumor cerebri. No flattening of the posterior aspect of the globes. Cervical medullary junction and pineal region unremarkable. MRA HEAD FINDINGS Ectatic right internal carotid artery distal cervical segment. Slight irregularity and ectasia of the cavernous segment of the internal carotid artery bilaterally. High-grade stenosis of proximal right middle cerebral artery branches just beyond the bifurcation. Decrease number of visualized right middle cerebral artery branches consistent with patient's acute infarct.  High-grade stenosis distal A1 segment right anterior cerebral artery. Markedly narrowed and irregular A2 segment anterior cerebral artery more notable on the right. Fetal type contribution to the left posterior cerebral artery. Mild to moderate narrowing left middle cerebral artery branch vessels. No significant stenosis of the distal vertebral arteries or basilar artery. Nonvisualized posterior inferior cerebellar arteries. Moderate narrowing proximal left anterior inferior cerebellar artery. Ectatic proximal aspect of the left superior cerebellar artery without saccular aneurysm. Mild prominence distal basilar artery without saccular aneurysm. Posterior cerebral artery mild moderate distal branch vessel irregularity. IMPRESSION: MR BRAIN: Acute nonhemorrhagic infarct right caudate. Acute nonhemorrhagic infarcts in a parasagittal distribution within the right frontal lobe and anterior aspect of the right parietal lobe. Remote anterior right frontal lobe/genu of the corpus callosum infarct. Remote small basal ganglia infarcts bilaterally. Moderate small vessel disease type changes. Partial opacification mastoid air cells greater on right. Paranasal sinus mucosal thickening. Expanded partially empty sella. This finding and slightly prominent peri optic spaces can be seen in this setting of pseudotumor cerebri. No flattening of the posterior aspect of the globes. MRA HEAD FINDINGS High-grade stenosis of proximal right middle cerebral artery branches just beyond the bifurcation. Decrease number of visualized right middle cerebral  artery branches consistent with patient's acute infarct. Please see above for additional findings. Electronically Signed By: Genia Del M.D. On: 05/10/2015 21:22     Assessment/Plan: Diagnosis: Left hemiparesis secondary to right caudate and right frontal infarct likely embolic 1. Does the need for close, 24 hr/day medical supervision in concert with the  patient's rehab needs make it unreasonable for this patient to be served in a less intensive setting? Yes 2. Co-Morbidities requiring supervision/potential complications: Morbid obesity, uterine bleeding, mild anemia 3. Due to bladder management, bowel management, safety, skin/wound care, disease management, medication administration, pain management and patient education, does the patient require 24 hr/day rehab nursing? Yes 4. Does the patient require coordinated care of a physician, rehab nurse, PT (1-2 hrs/day, 55 days/week) and OT (1-2 hrs/day, 5 days/week) to address physical and functional deficits in the context of the above medical diagnosis(es)? Yes Addressing deficits in the following areas: balance, endurance, locomotion, strength, transferring, bowel/bladder control, bathing, dressing, feeding, grooming and toileting 5. Can the patient actively participate in an intensive therapy program of at least 3 hrs of therapy per day at least 5 days per week? Yes 6. The potential for patient to make measurable gains while on inpatient rehab is excellent 7. Anticipated functional outcomes upon discharge from inpatient rehab are modified independent with PT, supervision with OT, n/a with SLP. 8. Estimated rehab length of stay to reach the above functional goals is: 10-12 days 9. Does the patient have adequate social supports and living environment to accommodate these discharge functional goals? Yes 10. Anticipated D/C setting: Home 11. Anticipated post D/C treatments: [place] therapy 12. Overall Rehab/Functional Prognosis: excellent  RECOMMENDATIONS: This patient's condition is appropriate for continued rehabilitative care in the following setting: CIR Patient has agreed to participate in recommended program. Yes Note that insurance prior authorization may be required for reimbursement for recommended care.  Comment: Has sister in [place] that used to be CNA    05/12/2015       Revision  History     Date/Time User Provider Type Action   05/12/2015 5:29 PM Charlett Blake, MD Physician Sign   05/12/2015 1:20 PM Cathlyn Parsons, PA-C Physician Assistant [place]   View Details Report       Routing History     Date/Time From To Method   05/12/2015 5:29 PM Charlett Blake, MD Charlett Blake, MD In Basket   05/12/2015 5:29 PM Charlett Blake, MD Rosita Fire, MD Fax

## 2015-05-14 NOTE — Progress Notes (Signed)
I await Insurance approval to hopefully admit this pt to inpt rehab today. RN CM is aware. [phone]

## 2015-05-14 NOTE — PMR Pre-admission (Signed)
PMR Admission Coordinator Pre-Admission Assessment  Patient: [name] is an 74 y.o., female MRN: [id number] DOB: 1941-01-20 Height: '4\' 11"'$  (149.9 cm) Weight: 132.224 kg (291 lb 8 oz)              Insurance Information HMO: yes    PPO:      PCP:      IPA:      80/20:      OTHER: medicare replacement  PRIMARY: [employer] Medicare Silverback      Policy#: [id number]      Subscriber: pt CM Name: Drema Halon      Phone#: [phone]     Fax#: [phone] Pre-Cert#: [id number]   Employer: retired approved until 6/14 Benefits:  Phone #: [phone]     Name: 05/13/15 Eff. Date: 12/06/13     Deduct: none      Out of Pocket Max: $5500      Life Max: none CIR: $275 per day days 1-7 then covers 100%      SNF: no copay days 1-20; $160 coapy per day days 21-100 Outpatient: $40 copay per visit     Co-Pay: no visit limit Home Health: 100%      Co-Pay: no visit limit DME: 80%     Co-Pay: 20% Providers: in network  SECONDARY: none        Medicaid Application Date:       Case Manager:  Disability Application Date:       Case Worker:   Emergency Contact Information Contact Information    Name Relation Home Work Mobile   [place] Daughter [phone]  [phone]   [name], [place] [phone]  [phone]   Colvin Caroli [phone]  [phone]     Current Medical History Left hemiparesis secondary to right caudate and right frontal infarct likely embolic History of Present Illness:[name] is a 74 y.o. right handed female with history of atrial fibrillation maintained on aspirin 81 mg daily, diabetes mellitus, hypertension, right ACA infarct August 2015 and had initially been placed on Eliquis but discontinued due to vaginal bleeding. Patient lives with her daughter and used a cane and sometimes a walker prior to admission.   Presented 05/10/2015 with left-sided weakness. MRI of the brain showed acute nonhemorrhagic infarct right caudate, acute nonhemorrhagic infarct  parasagittal distribution within the right frontal lobe and anterior aspect of the right parietal lobe. Remote anterior right frontal lobe/ genu of the corpus callosum. Remote small basal ganglia infarcts bilaterally. Moderate small vessel disease type changes. MRA of the head with high-grade stenosis proximal right middle cerebral artery branches just beyond the bifurcation. Carotid Dopplers with no ICA stenosis. . Patient did not receive TPA. Echocardiogram with ejection fraction of 52% grade 1 diastolic dysfunction without emboli. EEG negative for seizure. Neurology consulted currently maintained on intravenous heparin for stroke prophylaxis and transitioned to Eliquis on 05/14/15. Regular consistency diet.   Total: 4 NIH    Past Medical History  Past Medical History  Diagnosis Date  . Diabetes mellitus   . Asthma   . Chronic knee pain   . Back pain, chronic   . Hypertension   . Vertigo   . Hyperlipidemia   . TIA (transient ischemic attack)   . Anemia   . HOH (hard of hearing)   . Arthritis   . Obesity   . PMB (postmenopausal bleeding) 11/26/2014  . Urge incontinence 11/26/2014  . Thickened endometrium 01/15/2015  . Dysrhythmia   . Stroke     Mini Stroke  Family History  family history includes ADD / ADHD in her son; Arrhythmia in her father; Arthritis in her paternal grandfather; Diabetes in her maternal grandmother, mother, sister, and sister; Heart failure in her mother; Hypertension in her brother and sister; Lupus in her daughter; Mental illness in her son; Other in her mother, sister, and son; Stroke in her paternal grandmother. There is no history of Colon cancer.  Prior Rehab/Hospitalizations:  Has the patient had major surgery during 100 days prior to admission? Yes Has had a D and C and colonoscopy   Current Medications   Current facility-administered medications:  .  acetaminophen (TYLENOL) tablet 650 mg, 650 mg, Oral, Q4H PRN, 650 mg at 05/14/15 0903 **OR**  [DISCONTINUED] acetaminophen (TYLENOL) suppository 650 mg, 650 mg, Rectal, Q4H PRN, Kathie Dike, MD .  apixaban (ELIQUIS) tablet 5 mg, 5 mg, Oral, BID, Skeet Simmer, RPH, 5 mg at 05/14/15 1146 .  atorvastatin (LIPITOR) tablet 20 mg, 20 mg, Oral, q1800, Modena Jansky, MD, 20 mg at 05/13/15 1840 .  diltiazem (CARDIZEM CD) 24 hr capsule 120 mg, 120 mg, Oral, Daily, Kathie Dike, MD, 120 mg at 05/14/15 1021 .  fesoterodine (TOVIAZ) tablet 4 mg, 4 mg, Oral, Daily, Kathie Dike, MD, 4 mg at 05/14/15 1018 .  hydrOXYzine (ATARAX/VISTARIL) tablet 50 mg, 50 mg, Oral, TID PRN, Allie Bossier, MD .  insulin aspart (novoLOG) injection 0-15 Units, 0-15 Units, Subcutaneous, TID WC, Kathie Dike, MD, 0 Units at 05/11/15 0800 .  insulin aspart (novoLOG) injection 0-5 Units, 0-5 Units, Subcutaneous, QHS, Kathie Dike, MD, 0 Units at 05/10/15 2200 .  Linaclotide (LINZESS) capsule 145 mcg, 145 mcg, Oral, Daily, Kathie Dike, MD, 145 mcg at 05/14/15 1017 .  megestrol (MEGACE) tablet 120 mg, 120 mg, Oral, Daily, 120 mg at 05/14/15 1018 **FOLLOWED BY** [START ON 05/17/2015] megestrol (MEGACE) tablet 80 mg, 80 mg, Oral, Daily **FOLLOWED BY** [START ON 05/22/2015] megestrol (MEGACE) tablet 40 mg, 40 mg, Oral, Daily, Modena Jansky, MD .  senna-docusate (Senokot-S) tablet 1 tablet, 1 tablet, Oral, QHS PRN, Kathie Dike, MD  Patients Current Diet: Diet Carb Modified Fluid consistency:: Thin; Room service appropriate?: Yes Diet - low sodium heart healthy  Precautions / Restrictions Precautions Precautions: Fall Restrictions Weight Bearing Restrictions: No   Has the patient had 2 or more falls or a fall with injury in the past year?No [place] once going to church this year on Mother's day.  Prior Activity Level  Used RW and cane depending on her left knee issues. Did not drive. Had lived with dtr and her husband for 2 weeks pta. Dtr is currently moving into a new home which pt will live with her.  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  Home Assistive Devices / Equipment Home Assistive Devices/Equipment: Cane (specify quad or straight), CBG Meter, Dentures (specify type), Walker (specify type)  Prior Device Use: Indicate devices/aids used by the patient prior to current illness, exacerbation or injury? Walker and and cane depending on issues with her knees  Prior Functional Level Prior Function Level of Independence:  (used cane or RW dependign on how her l knee was functoning)  Self Care: Did the patient need help bathing, dressing, using the toilet or eating?  Independent  Indoor Mobility: Did the patient need assistance with walking from room to room (with or without device)? Independent  Stairs: Did the patient need assistance with internal or external stairs (with or without device)? Independent  Functional Cognition:  Did the patient need help planning regular tasks such as shopping or remembering to take medications? Independent  Current Functional Level Cognition  Arousal/Alertness: Awake/alert Overall Cognitive Status: Within Functional Limits for tasks assessed Orientation Level: Oriented X4 Attention: Sustained Sustained Attention: Appears intact Memory: Appears intact Safety/Judgment: Appears intact    Extremity Assessment (includes Sensation/Coordination)  Upper Extremity Assessment: LUE deficits/detail LUE Deficits / Details: can shrug shoulder, otherwise no movement noted in RUE LUE Sensation: decreased light touch LUE Coordination: decreased fine motor, decreased gross motor  Lower Extremity Assessment: Defer to PT evaluation LLE Deficits / Details: Strength grossly 3+/5 with decreased coordination and increased fatigue.   LLE Coordination: decreased fine motor, decreased gross motor    ADLs  Overall ADL's : Needs assistance/impaired Upper Body Dressing : Moderate assistance, Sitting Lower Body Dressing: Maximal assistance, Sit to/from  stand Toilet Transfer: Min guard, Stand-pivot, BSC Toileting- Clothing Manipulation and Hygiene: Maximal assistance, Sit to/from stand Functional mobility during ADLs: Min guard (stand pivot) General ADL Comments: Pt tranferred to [facility] and had BM. Assisted in toilet hygiene and educated on what pt could use for toilet aide. Educated on dressing technique.  Educated on being aware of LUE and supporting it on pillow-placed pillow under left arm at end of session.    Mobility  Overal bed mobility: Needs Assistance Bed Mobility: Supine to Sit Rolling: Min guard Supine to sit: Min assist, HOB elevated Sit to supine: Mod assist General bed mobility comments: in recliner    Transfers  Overall transfer level: Needs assistance Equipment used: Straight cane, None Transfers: Sit to/from Stand Sit to Stand: Min guard Stand pivot transfers: Min assist General transfer comment: repeated x 4; pt uses very wide base of support to come up and then brings feet closer together; slightly unsteady but no corrective assist needed    Ambulation / Gait / Stairs / Wheelchair Mobility  Ambulation/Gait Ambulation/Gait assistance: Min assist, +2 safety/equipment Ambulation Distance (Feet): 40 Feet Assistive device: Straight cane General Gait Details: LUE in sling to protect Lt shoulder; slight stagger to Lt twice; pt limited by fatigue (muscular in legs and respiratory) Gait Pattern/deviations: Step-through pattern, Decreased stride length, Wide base of support Gait velocity interpretation: Below normal speed for age/gender    Posture / Balance Balance Overall balance assessment: Needs assistance Sitting-balance support: No upper extremity supported, Feet supported Sitting balance-Leahy Scale: Fair Standing balance support: Single extremity supported Standing balance-Leahy Scale: Poor Standing balance comment: requires RUE support during weight shifting activities    Special needs/care consideration                        Bowel mgmt: LBM 6/7 Bladder mgmt:continent at [facility] Diabetic mgmt yes Vaginal Bleeding monitoring pt reports no bleeding since 05/13/15   Previous Home Environment Living Arrangements: Children, Other (Comment) (moved in with her dtr 3 weeks pta)  Lives With: Daughter, Other (Comment) (lives with her dtr and her husband) Available Help at Discharge: Family, Available 24 hours/day, Other (Comment) (dtr works days but may take [employer]) Type of Home: [place]: One level Home Access: Stairs to enter Entrance Stairs-Rails: Right, Left Entrance Stairs-Number of Steps: 3 Bathroom Shower/Tub: Walk-in shower, Other (comment) (and jacuzzi bath in same bathroom) Bathroom Toilet: Standard Bathroom Accessibility: Yes How Accessible: Accessible via walker [place]: No Additional Comments: pt recently 3 weeks ago moved in with dtr and her son inlaw in their new home Pt was living for two weeks with dtr  in dtr's old home. Dtr now moving into her new home and plans are for pt to d/c home with dtr and son in law. Dtr can take intermittent FMLA if needed. Does not have 24/7 supervision.  Discharge Living Setting Plans for Discharge Living Setting: Lives with (comment), Other (Comment) (Lives with dtr and son in law 3 weeks pta) Type of Home at Discharge: House Discharge Home Layout: One level Discharge Home Access: Stairs to enter Entrance Stairs-Rails: Right, Left Entrance Stairs-Number of Steps: 3 Discharge Bathroom Shower/Tub: Walk-in shower, Other (comment) (and jacuzzi tub per pt) Discharge Bathroom Toilet: Standard Discharge Bathroom Accessibility: Yes How Accessible: Accessible via walker Does the patient have any problems obtaining your medications?: No Pt's new room has bathroom across the hall from bedroom with tub/shower combination. Pt can use master bath with walk in shower if needed.  Social/Family/Support Systems Patient Roles: Parent Contact Information:  [name] Anticipated Caregiver: daughter and friends Anticipated Caregiver's Contact Information: see above Ability/Limitations of Caregiver: Dtr works but may take [employer] or they may hire assist Caregiver Availability: 24/7 Discharge Plan Discussed with Primary Caregiver: Yes Is Caregiver In Agreement with Plan?: Yes Does Caregiver/Family have Issues with Lodging/Transportation while Pt is in Rehab?: No  Goals/Additional Needs Patient/Family Goal for Rehab: Mod I with PT, Supervision with OT Expected length of stay: ELOS 10- 12 days Pt/Family Agrees to Admission and willing to participate: Yes Program Orientation Provided & Reviewed with Pt/Caregiver Including Roles  & Responsibilities: Yes  Decrease burden of Care through IP rehab admission: n/a  Possible need for SNF placement upon discharge:not anticipated  Patient Condition: This patient's condition remains as documented in the consult dated 05/12/2015 , in which the Rehabilitation Physician determined and documented that the patient's condition is appropriate for intensive rehabilitative care in an inpatient rehabilitation facility. Pt overall min assist functionally. Medical update as listed in History. Will admit to inpatient rehab today.  Preadmission Screen Completed By:  Cleatrice Burke, 05/14/2015 4:12 PM ______________________________________________________________________   Discussed status with Dr. Letta Pate on 05/14/15 at  1612 and received telephone approval for admission today.  Admission Coordinator:  Cleatrice Burke, time 5537 Date 05/14/2015.

## 2015-05-15 ENCOUNTER — Inpatient Hospital Stay (HOSPITAL_COMMUNITY): Payer: Commercial Managed Care - HMO

## 2015-05-15 ENCOUNTER — Inpatient Hospital Stay (HOSPITAL_COMMUNITY): Payer: Commercial Managed Care - HMO | Admitting: Occupational Therapy

## 2015-05-15 DIAGNOSIS — I6340 Cerebral infarction due to embolism of unspecified cerebral artery: Secondary | ICD-10-CM

## 2015-05-15 DIAGNOSIS — G8190 Hemiplegia, unspecified affecting unspecified side: Secondary | ICD-10-CM

## 2015-05-15 DIAGNOSIS — N319 Neuromuscular dysfunction of bladder, unspecified: Secondary | ICD-10-CM

## 2015-05-15 LAB — COMPREHENSIVE METABOLIC PANEL
ALT: 13 U/L — ABNORMAL LOW (ref 14–54)
AST: 17 U/L (ref 15–41)
Albumin: 2.8 g/dL — ABNORMAL LOW (ref 3.5–5.0)
Alkaline Phosphatase: 38 U/L (ref 38–126)
Anion gap: 7 (ref 5–15)
BUN: 15 mg/dL (ref 6–20)
CO2: 23 mmol/L (ref 22–32)
Calcium: 8.7 mg/dL — ABNORMAL LOW (ref 8.9–10.3)
Chloride: 108 mmol/L (ref 101–111)
Creatinine, Ser: 0.9 mg/dL (ref 0.44–1.00)
GFR calc Af Amer: >60 mL/min (ref >60)
GFR calc non Af Amer: >60 mL/min (ref >60)
Glucose, Bld: 90 mg/dL (ref 65–99)
Potassium: 4.1 mmol/L (ref 3.5–5.1)
Sodium: 138 mmol/L (ref 135–145)
Total Bilirubin: 0.5 mg/dL (ref 0.3–1.2)
Total Protein: 6 g/dL — ABNORMAL LOW (ref 6.5–8.1)

## 2015-05-15 LAB — CBC WITH DIFFERENTIAL/PLATELET
Basophils Absolute: 0 10*3/uL (ref 0.0–0.1)
Basophils Relative: 0 % (ref 0–1)
Eosinophils Absolute: 0.1 10*3/uL (ref 0.0–0.7)
Eosinophils Relative: 1 % (ref 0–5)
HCT: 32.1 % — ABNORMAL LOW (ref 36.0–46.0)
Hemoglobin: 10.8 g/dL — ABNORMAL LOW (ref 12.0–15.0)
Lymphocytes Relative: 34 % (ref 12–46)
Lymphs Abs: 1.3 10*3/uL (ref 0.7–4.0)
MCH: 33 pg (ref 26.0–34.0)
MCHC: 33.6 g/dL (ref 30.0–36.0)
MCV: 98.2 fL (ref 78.0–100.0)
Monocytes Absolute: 0.3 10*3/uL (ref 0.1–1.0)
Monocytes Relative: 9 % (ref 3–12)
Neutro Abs: 2.1 10*3/uL (ref 1.7–7.7)
Neutrophils Relative %: 56 % (ref 43–77)
Platelets: 179 10*3/uL (ref 150–400)
RBC: 3.27 MIL/uL — ABNORMAL LOW (ref 3.87–5.11)
RDW: 13.3 % (ref 11.5–15.5)
WBC: 3.7 10*3/uL — ABNORMAL LOW (ref 4.0–10.5)

## 2015-05-15 LAB — GLUCOSE, CAPILLARY
Glucose-Capillary: 88 mg/dL (ref 65–99)
Glucose-Capillary: 101 mg/dL — ABNORMAL HIGH (ref 65–99)
Glucose-Capillary: 91 mg/dL (ref 65–99)
Glucose-Capillary: 102 mg/dL — ABNORMAL HIGH (ref 65–99)

## 2015-05-15 LAB — URINALYSIS, ROUTINE W REFLEX MICROSCOPIC
Bilirubin Urine: NEGATIVE
Glucose, UA: NEGATIVE mg/dL
Ketones, ur: NEGATIVE mg/dL
Leukocytes, UA: NEGATIVE
Nitrite: NEGATIVE
Protein, ur: 30 mg/dL — AB
Specific Gravity, Urine: 1.022 (ref 1.005–1.030)
Urobilinogen, UA: 1 mg/dL (ref 0.0–1.0)
pH: 5.5 (ref 5.0–8.0)

## 2015-05-15 LAB — URINE MICROSCOPIC-ADD ON

## 2015-05-15 NOTE — Interval H&P Note (Signed)
[name] was admitted today to Inpatient Rehabilitation with the diagnosis of RIght Caudate  And frontal ACA distribution infarct with Left Hemiparesis.  The patient's history has been reviewed, patient examined, and there is no change in status.  Patient continues to be appropriate for intensive inpatient rehabilitation.  I have reviewed the patient's chart and labs.  Questions were answered to the patient's satisfaction. The PAPE has been reviewed and assessment remains appropriate.  [name] 05/15/2015, 6:43 AM

## 2015-05-15 NOTE — Progress Notes (Addendum)
   Subjective/Complaints: CC Freq urination Pt c/o urination every hour or so last noc, slept poorly.  Had some similar problems for the last couple months, no hx bladder surgery.  No burning with urination, RN notes mild foul odor, no fever or chills.  ROS:  No cough, no SOB , off O2,  No nausea or vomiting  Having BMs last one 6/7 Objective: Vital Signs: Blood pressure 138/63, pulse 65, temperature 98.5 F (36.9 C), temperature source Oral, resp. rate 18, last menstrual period 03/31/2013, SpO2 94 %. No results found. Results for orders placed or performed during the hospital encounter of 05/14/15 (from the past 72 hour(s))  Glucose, capillary     Status: Abnormal   Collection Time: 05/14/15  9:11 PM  Result Value Ref Range   Glucose-Capillary 103 (H) 65 - 99 mg/dL   Comment 1 Notify RN      HEENT: normal Cardio: RRR and no murmur Resp: CTA B/L and unlabored GI: BS positive and NT, ND Extremity:  Edema Left hand dorsum edema Skin:   Intact Neuro: Alert/Oriented, Cranial Nerve II-XII normal, Normal Sensory, Abnormal Motor 5/5 on right side, 2- Left wrist flexor and finger flex, tr biceps, 0/5 left FE WE, 3/5 L HF, KE ankle DF and Abnormal FMC Ataxic/ dec FMC Musc/Skel:  Other no pain with Left shoulder ROM no pain with shoulder palpation Gen NAD   Assessment/Plan: 1. 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 Physiatrist is providing close team supervision and 24 hour management of active medical problems listed below. Physiatrist and rehab team continue to assess barriers to discharge/monitor patient progress toward functional and medical goals. FIM:                                  Medical Problem List and Plan: 1. Functional deficits secondary to right caudate and right frontal  lobe infarction likely embolic 2. DVT Prophylaxis/Anticoagulation: Intravenous heparin and transitioned to Eliquis. Monitor for any bleeding episodes 3. Pain Management: Tylenol as needed 4. Atrial fibrillation. Continue Cardizem 120 mg daily. Cardiac rate control, rate and BP ok 5. Neuropsych: This patient is capable of making decisions on her own behalf. 6. Skin/Wound Care: Routine skin checks 7. Fluids/Electrolytes/Nutrition: Routine I&O with follow-up chemistries 8. Diabetes mellitus with peripheral neuropathy. Hemoglobin A1c 6.1. Currently on sliding scale insulin. Check CBGs before meals and at bedtime. Patient onTradjenta 5 mg daily prior to admission. Resume as tolerated 9. History of vaginal bleeding. Monitor on Eliquis 10. Chronic constipation. Continue Linzess 11. Hyperlipidemia. Lipitor 12 Neurogenic bladder check UA, PVR, starting toviaz if PVR ok LOS (Days) 1 A FACE TO FACE EVALUATION WAS PERFORMED  Keajah Killough E 05/15/2015, 7:03 AM

## 2015-05-15 NOTE — Evaluation (Signed)
Physical Therapy Assessment and Plan  Patient Details  Name: [name] MRN: [id number] Date of Birth: 01-Oct-1941  PT Diagnosis: Abnormal posture, Difficulty walking, Edema, Hemiparesis non-dominant, Impaired sensation, Muscle weakness and Pain in joint Rehab Potential: Good ELOS: 7-10 days   Today's Date: 05/15/2015 PT Individual Time: 1300-1430 PT Individual Time Calculation (min): 90 min    Problem List:  Patient Active Problem List   Diagnosis Date Noted  . 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 Embolic cerebral infarction 05/14/2015  . Cerebral infarction due to unspecified mechanism   . Postmenopausal vaginal bleeding   . Paroxysmal atrial fibrillation   . Stroke   . Vaginal bleeding   . Left-sided weakness 05/10/2015  . CVA (cerebral vascular accident) 05/10/2015  . Complex endometrial hyperplasia without atypia 02/11/2015  . Simple endometrial hyperplasia without atypia 02/11/2015  . Change in bowel habits 02/07/2015  . Thickened endometrium 01/15/2015  . PMB (postmenopausal bleeding) 11/26/2014  . Urge incontinence 11/26/2014  . Cerebral embolism with cerebral infarction 07/27/2014  . Left leg weakness 07/26/2014  . Right frontal lobe lesion 07/26/2014  . Hyperlipidemia 03/13/2013  . Sleep apnea 03/13/2013  . New onset atrial fibrillation 03/13/2013  . Near syncope 03/13/2013  . Atrial fibrillation 03/12/2013  . HYPERCHOLESTEROLEMIA 05/09/2009  . SINUSITIS 05/09/2009  . BUNION, RIGHT FOOT 05/09/2009  . SHINGLES 11/07/2008  . SUPRASPINATUS TENDINITIS 08/28/2008  . DIABETES MELLITUS, CONTROLLED 08/21/2008  . SHOULDER PAIN, LEFT 08/21/2008  . SPRAIN AND STRAIN OF STERNOCLAVICULAR 07/04/2008  . OBESITY, MORBID 02/02/2008  . PROTEINURIA 02/02/2008  . DM (diabetes mellitus), type 2 with complications 96/28/3662  . ANEMIA 12/20/2007  . Essential hypertension, benign 12/20/2007  . ASTHMA 12/20/2007  . Unspecified  arthropathy, lower leg 12/20/2007    Past Medical History:  Past Medical History  Diagnosis Date  . Diabetes mellitus   . Asthma   . Chronic knee pain   . Back pain, chronic   . Hypertension   . Vertigo   . Hyperlipidemia   . TIA (transient ischemic attack)   . Anemia   . HOH (hard of hearing)   . Arthritis   . Obesity   . PMB (postmenopausal bleeding) 11/26/2014  . Urge incontinence 11/26/2014  . Thickened endometrium 01/15/2015  . Dysrhythmia   . Stroke     Mini Stroke   Past Surgical History:  Past Surgical History  Procedure Laterality Date  . Appendectomy    . Ovary surgery    . Cataract extraction w/phaco Left 01/16/2013    Procedure: CATARACT EXTRACTION PHACO AND INTRAOCULAR LENS PLACEMENT (IOC);  Surgeon: Elta Guadeloupe T. Gershon Crane, MD;  Location: [facility];  Service: Ophthalmology;  Laterality: Left;  CDE:14.37  . Endometrial biopsy  04/03/2013       . Hysteroscopy w/d&c N/A 04/24/2013    Procedure: SUCTION DILATATION AND CURETTAGE /HYSTEROSCOPY;  Surgeon: Jonnie Kind, MD;  Location: [facility];  Service: Gynecology;  Laterality: N/A;  . Knee arthroscopy with medial menisectomy Left 07/26/2013    Procedure: KNEE ARTHROSCOPY WITH PARTIAL MEDIAL MENISECTOMY;  Surgeon: Sanjuana Kava, MD;  Location: [facility];  Service: Orthopedics;  Laterality: Left;  . Endometrial ablation    . Colonoscopy  2009    Dr. Wilford Corner: 4 hyperplastic polyps removed. Small internal hemorrhoids. Recommended 5 year follow-up colonoscopy.  . Colonoscopy N/A 02/24/2015    Procedure: COLONOSCOPY;  Surgeon: Danie Binder, MD;  Location: [facility];  Service: Endoscopy;  Laterality: N/A;  1030  . Hysteroscopy w/d&c N/A 04/02/2015    Procedure: UTERINE CURETTAGE, HYSTEROSCOPY;  Surgeon: Florian Buff, MD;  Location: [facility];  Service: Gynecology;  Laterality: N/A;    Assessment & Plan Clinical Impression: Patient is a 74 y.o. year old female with recent admission to the hospital with history of atrial  fibrillation maintained on aspirin 81 mg daily, diabetes mellitus, hypertension, right ACA infarct August 2015 and had initially been placed on Eliquis but discontinued due to vaginal bleeding. Patient lives with her daughter and used a cane and sometimes a walker prior to admission. Presented 05/10/2015 with left-sided weakness. MRI of the brain showed acute nonhemorrhagic infarct right caudate, acute nonhemorrhagic infarct parasagittal distribution within the right frontal lobe and anterior aspect of the right parietal lobe. Remote anterior right frontal lobe/ genu of the corpus callosum. Remote small basal ganglia infarcts bilaterally. Moderate small vessel disease type changes. MRA of the head with high-grade stenosis proximal right middle cerebral artery branches just beyond the bifurcation. Carotid Dopplers with no ICA stenosis. . Patient did not receive TPA. Echocardiogram with ejection fraction of 47% grade 1 diastolic dysfunction without emboli. EEG negative for seizure. Neurology consulted currently maintained on intravenous heparin for stroke prophylaxis and transitioned to Eliquis.. Regular consistency diet. Physical therapy evaluation completed 05/12/2015 with recommendations of physical medicine rehabilitation consult.  Patient transferred to CIR on 05/14/2015 .   Patient currently requires min with mobility secondary to muscle weakness and muscle paralysis, decreased cardiorespiratoy endurance, abnormal tone and decreased coordination and decreased sitting balance, decreased standing balance, decreased balance strategies and hemiparesis.  Prior to hospitalization, patient was modified independent  with mobility and lived with Daughter, Other (Comment) (lives with daughter and son in [employer]) in a House.  Home access is 4Stairs to enter with rails on each side (unable to reach both at same time).  Patient will benefit from skilled PT intervention to maximize safe functional mobility, minimize fall risk  and decrease caregiver burden for planned discharge home with 24 hour supervision.  Anticipate patient will benefit from follow up [place] at discharge.  PT - End of Session Endurance Deficit: Yes Endurance Deficit Description: rest breaks throughout PT Assessment Rehab Potential (ACUTE/IP ONLY): Good PT Patient demonstrates impairments in the following area(s): Balance;Edema;Endurance;Motor;Pain;Safety;Sensory;Skin Integrity PT Transfers Functional Problem(s): Bed Mobility;Bed to Chair;Car;Furniture PT Locomotion Functional Problem(s): Ambulation;Stairs PT Plan PT Intensity: Minimum of 1-2 x/day ,45 to 90 minutes PT Frequency: 5 out of 7 days PT Duration Estimated Length of Stay: 7-10 days PT Treatment/Interventions: Ambulation/gait training;Balance/vestibular training;Community reintegration;Discharge planning;Disease management/prevention;DME/adaptive equipment instruction;Functional mobility training;Neuromuscular re-education;Pain management;Patient/family education;Psychosocial support;Skin care/wound management;Splinting/orthotics;Stair training;Therapeutic Activities;Therapeutic Exercise;UE/LE Strength taining/ROM;UE/LE Coordination activities;Wheelchair propulsion/positioning PT Transfers Anticipated Outcome(s): mod I basic; S car PT Locomotion Anticipated Outcome(s): mod I household gait; S stairs PT Recommendation Follow Up Recommendations: Home health PT;24 hour supervision/assistance Patient destination: Home Equipment Recommended: None recommended by PT Equipment Details: Pt owns [facility] and RW  Skilled Therapeutic Intervention Individual treatment initiated with focus on transfers (including toilet, bed, w/c, and recliner), gait training with and without AD (introduced SPC as pt was using this PTA and progressed from min A to close S during session with practice and by end of session able to walk from therapy gym to pt room > 120'), education on LUE management and positioning for edema  control and pain relief (discuss with OT if recommendation for sling during upright mobility to support shoulder due to increased pain), administered Berg Balance test and verbalized results to patient  who states understanding, stair negotiation training for home entry simulation with cues for which foot to lead with and min A for balance using R rail, and education on PT POC and goals. Handoff to OT in room for next therapy session.   PT Evaluation Precautions/Restrictions Precautions Precautions: Fall Precaution Comments: L hemiparesis Restrictions Weight Bearing Restrictions: No Pain Reports pain in L shoulder - repositioned as needed and support provided. Home Living/Prior Functioning Home Living Living Arrangements: Children Available Help at Discharge: Family;Available 24 hours/day;Other (Comment) (daughter works days but taking Event organiser and planning on hiring) Type of Home: House Home Access: Stairs to enter Technical brewer of Steps: 4 Entrance Stairs-Rails: [place]: One level  Lives With: Daughter;Other (Comment) (lives with daughter and son in [employer] [place]) Prior Function Level of Independence: Requires assistive device for independence Driving: No Comments: Pt reports using SPC and RW depending on how her legs felt on a given day Cognition Overall Cognitive Status: Within Functional Limits for tasks assessed Arousal/Alertness: Awake/alert Attention: Sustained Sustained Attention: Appears intact Memory: Appears intact Safety/Judgment: Appears intact Sensation Sensation Light Touch: Appears Intact (BLE) Coordination Gross Motor Movements are Fluid and Coordinated: No (LUE) Motor  Motor Motor: Other (comment);Abnormal tone;Abnormal postural alignment and control (L hemiparesis) Motor - Skilled Clinical Observations: L hemiparesis (UE > LE)  Locomotion  Ambulation Ambulation/Gait Assistance: 4: Min assist  Trunk/Postural Assessment  Cervical  Assessment Cervical Assessment: Within Functional Limits Thoracic Assessment Thoracic Assessment: Exceptions to [facility] (flexed posture) Lumbar Assessment Lumbar Assessment: Exceptions to [facility] (posterior pelvic tilt)  Balance Balance Balance Assessed: Yes Standardized Balance Assessment Standardized Balance Assessment: Berg Balance Test Berg Balance Test Sit to Stand: Able to stand  independently using hands Standing Unsupported: Able to stand 30 seconds unsupported Sitting with Back Unsupported but Feet Supported on Floor or Stool: Able to sit safely and securely 2 minutes Stand to Sit: Uses backs of legs against chair to control descent Transfers: Needs one person to assist Standing Unsupported with Eyes Closed: Able to stand 10 seconds with supervision Standing Ubsupported with Feet Together: Needs help to attain position but able to stand for 30 seconds with feet together From Standing, Reach Forward with Outstretched Arm: Can reach forward >12 cm safely (5") From Standing Position, Pick up Object from Floor: Able to pick up shoe, needs supervision From Standing Position, Turn to Look Behind Over each Shoulder: Looks behind one side only/other side shows less weight shift Turn 360 Degrees: Able to turn 360 degrees safely one side only in 4 seconds or less Standing Unsupported, Alternately Place Feet on Step/Stool: Able to complete >2 steps/needs minimal assist Standing Unsupported, One Foot in Front: Needs help to step but can hold 15 seconds Standing on One Leg: Unable to try or needs assist to prevent fall Total Score: 30 Static Sitting Balance Static Sitting - Level of Assistance: 5: Stand by assistance Dynamic Sitting Balance Dynamic Sitting - Level of Assistance: 5: Stand by assistance Static Standing Balance Static Standing - Level of Assistance: 4: Min assist Dynamic Standing Balance Dynamic Standing - Level of Assistance: 4: Min assist Extremity Assessment      RLE  Assessment RLE Assessment: Within Functional Limits (decreased muscular endurance; 4/5) LLE Assessment LLE Assessment: Exceptions to [facility] (grossly 3+5 to 4-/5; dec functional endurance)  FIM:  FIM - Control and instrumentation engineer Devices: Arm rests Bed/Chair Transfer: 4: Supine > Sit: Min A (steadying Pt. > 75%/lift 1 leg);4: Bed > Chair or W/C: Min A (steadying Pt. >  75%);4: Chair or W/C > Bed: Min A (steadying Pt. > 75%) FIM - Locomotion: Wheelchair Locomotion: Wheelchair: 1: Total Assistance/staff pushes wheelchair (Pt<25%) FIM - Locomotion: Ambulation Ambulation/Gait Assistance: 4: Min assist Locomotion: Ambulation: 1: Travels less than 50 ft with minimal assistance (Pt.>75%) FIM - Locomotion: Stairs Locomotion: Scientist, physiological: Hand rail - 1 Locomotion: Stairs: 2: Up and Down 4 - 11 stairs with minimal assistance (Pt.>75%)   Refer to Care Plan for Long Term Goals  Recommendations for other services: None  Discharge Criteria: Patient will be discharged from PT if patient refuses treatment 3 consecutive times without medical reason, if treatment goals not met, if there is a change in medical status, if patient makes no progress towards goals or if patient is discharged from hospital.  The above assessment, treatment plan, treatment alternatives and goals were discussed and mutually agreed upon: by patient  Allayne Gitelman 05/15/2015, 3:47 PM

## 2015-05-15 NOTE — Progress Notes (Signed)
Patient information reviewed and entered into eRehab system by Miklo Aken, RN, CRRN, PPS Coordinator.  Information including medical coding and functional independence measure will be reviewed and updated through discharge.     Per nursing patient was given "Data Collection Information Summary for Patients in Inpatient Rehabilitation Facilities with attached "Privacy Act Statement-Health Care Records" upon admission.  

## 2015-05-15 NOTE — Progress Notes (Signed)
Occupational Therapy Assessment and Plan  Patient Details  Name: [name] MRN: [id number] Date of Birth: [birth year]  OT Diagnosis: abnormal posture, muscle weakness (generalized) and decreased sensation, hemiparesis of non dominant side Rehab Potential: Rehab Potential (ACUTE ONLY): Good ELOS: 7-10 days   Today's Date: 05/15/2015 OT Individual Time: 1000-1100 OT Individual Time Calculation (min): 60 min     Problem List:  Patient Active Problem List   Diagnosis Date Noted  . 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 Embolic cerebral infarction 05/14/2015  . Cerebral infarction due to unspecified mechanism   . Postmenopausal vaginal bleeding   . Paroxysmal atrial fibrillation   . Stroke   . Vaginal bleeding   . Left-sided weakness 05/10/2015  . CVA (cerebral vascular accident) 05/10/2015  . Complex endometrial hyperplasia without atypia 02/11/2015  . Simple endometrial hyperplasia without atypia 02/11/2015  . Change in bowel habits 02/07/2015  . Thickened endometrium 01/15/2015  . PMB (postmenopausal bleeding) 11/26/2014  . Urge incontinence 11/26/2014  . Cerebral embolism with cerebral infarction 07/27/2014  . Left leg weakness 07/26/2014  . Right frontal lobe lesion 07/26/2014  . Hyperlipidemia 03/13/2013  . Sleep apnea 03/13/2013  . New onset atrial fibrillation 03/13/2013  . Near syncope 03/13/2013  . Atrial fibrillation 03/12/2013  . HYPERCHOLESTEROLEMIA 05/09/2009  . SINUSITIS 05/09/2009  . BUNION, RIGHT FOOT 05/09/2009  . SHINGLES 11/07/2008  . SUPRASPINATUS TENDINITIS 08/28/2008  . DIABETES MELLITUS, CONTROLLED 08/21/2008  . SHOULDER PAIN, LEFT 08/21/2008  . SPRAIN AND STRAIN OF STERNOCLAVICULAR 07/04/2008  . OBESITY, MORBID 02/02/2008  . PROTEINURIA 02/02/2008  . DM (diabetes mellitus), type 2 with complications 06/04/1600  . ANEMIA 12/20/2007  . Essential hypertension, benign 12/20/2007  . ASTHMA 12/20/2007   . Unspecified arthropathy, lower leg 12/20/2007    Past Medical History:  Past Medical History  Diagnosis Date  . Diabetes mellitus   . Asthma   . Chronic knee pain   . Back pain, chronic   . Hypertension   . Vertigo   . Hyperlipidemia   . TIA (transient ischemic attack)   . Anemia   . HOH (hard of hearing)   . Arthritis   . Obesity   . PMB (postmenopausal bleeding) 11/26/2014  . Urge incontinence 11/26/2014  . Thickened endometrium 01/15/2015  . Dysrhythmia   . Stroke     Mini Stroke   Past Surgical History:  Past Surgical History  Procedure Laterality Date  . Appendectomy    . Ovary surgery    . Cataract extraction w/phaco Left 01/16/2013    Procedure: CATARACT EXTRACTION PHACO AND INTRAOCULAR LENS PLACEMENT (IOC);  Surgeon: Elta Guadeloupe T. Gershon Crane, MD;  Location: [facility];  Service: Ophthalmology;  Laterality: Left;  CDE:14.37  . Endometrial biopsy  04/03/2013       . Hysteroscopy w/d&c N/A 04/24/2013    Procedure: SUCTION DILATATION AND CURETTAGE /HYSTEROSCOPY;  Surgeon: Jonnie Kind, MD;  Location: [facility];  Service: Gynecology;  Laterality: N/A;  . Knee arthroscopy with medial menisectomy Left 07/26/2013    Procedure: KNEE ARTHROSCOPY WITH PARTIAL MEDIAL MENISECTOMY;  Surgeon: Sanjuana Kava, MD;  Location: [facility];  Service: Orthopedics;  Laterality: Left;  . Endometrial ablation    . Colonoscopy  2009    Dr. Wilford Corner: 4 hyperplastic polyps removed. Small internal hemorrhoids. Recommended 5 year follow-up colonoscopy.  . Colonoscopy N/A 02/24/2015    Procedure: COLONOSCOPY;  Surgeon: Danie Binder, MD;  Location: [facility];  Service: Endoscopy;  Laterality: N/A;  1030  . Hysteroscopy w/d&c N/A 04/02/2015    Procedure: UTERINE CURETTAGE, HYSTEROSCOPY;  Surgeon: Florian Buff, MD;  Location: [facility];  Service: Gynecology;  Laterality: N/A;    Assessment & Plan Clinical Impression: Patient is a 74 y.o. year old female with history of atrial fibrillation maintained on  aspirin 81 mg daily, diabetes mellitus, hypertension, right ACA infarct August 2015 and had initially been placed on Eliquis but discontinued due to vaginal bleeding. Patient lives with her daughter and used a cane and sometimes a walker prior to admission. Presented 05/10/2015 with left-sided weakness. MRI of the brain showed acute nonhemorrhagic infarct right caudate, acute nonhemorrhagic infarct parasagittal distribution within the right frontal lobe and anterior aspect of the right parietal lobe. Remote anterior right frontal lobe/ genu of the corpus callosum. Remote small basal ganglia infarcts bilaterally. Moderate small vessel disease type changes. MRA of the head with high-grade stenosis proximal right middle cerebral artery branches just beyond the bifurcation. Carotid Dopplers with no ICA stenosis. . Patient did not receive TPA. Echocardiogram with ejection fraction of 15% grade 1 diastolic dysfunction without emboli. EEG negative for seizure. Neurology consulted currently maintained on intravenous heparin for stroke prophylaxis and transitioned to Eliquis.. Regular consistency diet. Physical therapy evaluation completed 05/12/2015 with recommendations of physical medicine rehabilitation consult. .  Patient transferred to CIR on 05/14/2015 .    Patient currently requires mod - max with basic self-care skills secondary to muscle weakness, decreased cardiorespiratoy endurance, decreased coordination and decreased sitting balance, decreased standing balance, decreased postural control, decreased balance strategies and hemiparesis.  Prior to hospitalization, patient could complete ADLs with modified independent .  Patient will benefit from skilled intervention to increase independence with basic self-care skills prior to discharge home with care partner.  Anticipate patient will require intermittent supervision and will require follow up services with [facility] vs outpatient..  OT - End of Session Activity  Tolerance: Decreased this session Endurance Deficit: Yes Endurance Deficit Description: rest breaks throughout OT Assessment Rehab Potential (ACUTE ONLY): Good Barriers to Discharge:  (none known) OT Patient demonstrates impairments in the following area(s): Balance;Pain;Edema;Safety;Endurance;Motor OT Basic ADL's Functional Problem(s): Grooming;Bathing;Dressing;Toileting OT Advanced ADL's Functional Problem(s):  (n/a) OT Transfers Functional Problem(s): Toilet;Tub/Shower OT Additional Impairment(s): Fuctional Use of Upper Extremity OT Plan OT Intensity: Minimum of 1-2 x/day, 45 to 90 minutes OT Frequency: 5 out of 7 days OT Duration/Estimated Length of Stay: 7-10 days OT Treatment/Interventions: Balance/vestibular training;Community reintegration;Functional electrical stimulation;Neuromuscular re-education;Patient/family education;Self Care/advanced ADL retraining;Therapeutic Exercise;UE/LE Coordination activities;UE/LE Strength taining/ROM;Therapeutic Activities;Pain management;Functional mobility training;DME/adaptive equipment instruction;Discharge planning OT Self Feeding Anticipated Outcome(s): set up A OT Basic Self-Care Anticipated Outcome(s): Mod I  OT Toileting Anticipated Outcome(s): Mod I  OT Bathroom Transfers Anticipated Outcome(s): Mod I - toileting, supervision - shower OT Recommendation Recommendations for Other Services:  (none) Patient destination: Home Follow Up Recommendations: Home health OT;Outpatient OT;24 hour supervision/assistance Equipment Recommended: To be determined   Skilled Therapeutic Intervention Upon entering the room, pt supine in bed awaiting therapist. Pt with no c/o pain this session. Supine >sit with Mod a to EOB. Pt standing from bed and began urinating on floor and all the way to bathroom. Hand held assist for ambulation to toilet where pt also able to have BM. Pt required assistance for clothing management and hygiene this session. Pt ambulated  with hand held assist to walk in shower to sit on TTB for bathing tasks. Pt required hand over hand assist in order to engage L UE  into ADL tasks. Pt seated in recliner chair to complete dressing tasks with min A STS for donning pull over gown. Pt remained seated in recliner chair with call bell and all needed items within reach. OT educated pt on OT purpose, POC, and goals with pt in agreement.   OT Evaluation Precautions/Restrictions  Precautions Precautions: Fall Restrictions Weight Bearing Restrictions: No Pain Pain Assessment Pain Assessment: No/denies pain Home Living/Prior Functioning Home Living Family/patient expects to be discharged to:: Private residence Living Arrangements: Children Available Help at Discharge: Family, Available 24 hours/day, Other (Comment) (daughter works days but taking Event organiser and planning on Neurosurgeon per pt report) Type of Home: House Home Access: Stairs to enter Technical brewer of Steps: 4 Entrance Stairs-Rails: Right, Left Home Layout: One level  Lives With: Daughter, Other (Comment) (lives with daughter and son in Sports coach) IADL History Homemaking Responsibilities: No Current License: No Prior Function Level of Independence: Requires assistive device for independence Driving: No Vision/Perception  Vision- History Baseline Vision/History: Wears glasses Wears Glasses: At all times Patient Visual Report: No change from baseline Vision- Assessment Vision Assessment?: No apparent visual deficits  Cognition Overall Cognitive Status: Within Functional Limits for tasks assessed Arousal/Alertness: Awake/alert Orientation Level: Person;Place;Situation Person: Oriented Place: Oriented Situation: Oriented Year: 2016 Month: June Day of Week: Correct Memory: Appears intact Immediate Memory Recall: Sock;Blue;Bed Memory Recall: Sock;Blue;Bed Memory Recall Sock: Without Cue Memory Recall Blue: Without Cue Memory Recall Bed: Without  Cue Attention: Sustained Sustained Attention: Appears intact Safety/Judgment: Appears intact Sensation   Motor    Mobility     Trunk/Postural Assessment     Balance   Extremity/Trunk Assessment      FIM:  FIM - Grooming Grooming Steps: Wash, rinse, dry face Grooming: 2: Patient completes 1 of 4 or 2 of 5 steps FIM - Bathing Bathing Steps Patient Completed: Chest;Abdomen;Right upper leg;Left upper leg Bathing: 2: Max-Patient completes 3-4 18f10 parts or 25-49% FIM - Upper Body Dressing/Undressing Upper body dressing/undressing steps patient completed: Thread/unthread right sleeve of pullover shirt/dresss;Put head through opening of pull over shirt/dress Upper body dressing/undressing: 3: Mod-Patient completed 50-74% of tasks FIM - Lower Body Dressing/Undressing Lower body dressing/undressing steps patient completed: Thread/unthread left underwear leg;Thread/unthread right underwear leg Lower body dressing/undressing: 2: Max-Patient completed 25-49% of tasks FIM - Toileting Toileting steps completed by patient: Adjust clothing prior to toileting Toileting: 2: Max-Patient completed 1 of 3 steps FIM - Bed/Chair Transfer Bed/Chair Transfer: 2: Supine > Sit: Max A (lifting assist/Pt. 25-49%);3: Bed > Chair or W/C: Mod A (lift or lower assist) FIM - TAir cabin crewTransfers Assistive Devices: Elevated toilet seat;Grab bars;Walker Toilet Transfers: 3-To toilet/BSC: Mod A (lift or lower assist);3-From toilet/BSC: Mod A (lift or lower assist) FIM - Tub/Shower Transfers Tub/Shower Assistive Devices: Tub transfer bench;Grab bars;Walk in shower Tub/shower Transfers: 3-Into Tub/Shower: Mod A (lift or lower/lift 2 legs);4-Out of Tub/Shower: Min A (steadying Pt. > 75%/lift 1 leg)   Refer to Care Plan for Long Term Goals  Recommendations for other services: None  Discharge Criteria: Patient will be discharged from OT if patient refuses treatment 3 consecutive times without  medical reason, if treatment goals not met, if there is a change in medical status, if patient makes no progress towards goals or if patient is discharged from hospital.  The above assessment, treatment plan, treatment alternatives and goals were discussed and mutually agreed upon: by patient  PPhineas Semen6/08/2015, 12:29 PM

## 2015-05-15 NOTE — Progress Notes (Signed)
Social Work Assessment and Plan Social Work Assessment and Plan  Patient Details  Name: [name] MRN: [id number] Date of Birth: [birth year]  Today's Date: 05/15/2015  Problem List:  Patient Active Problem List   Diagnosis Date Noted  . 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 Embolic cerebral infarction 05/14/2015  . Cerebral infarction due to unspecified mechanism   . Postmenopausal vaginal bleeding   . Paroxysmal atrial fibrillation   . Stroke   . Vaginal bleeding   . Left-sided weakness 05/10/2015  . CVA (cerebral vascular accident) 05/10/2015  . Complex endometrial hyperplasia without atypia 02/11/2015  . Simple endometrial hyperplasia without atypia 02/11/2015  . Change in bowel habits 02/07/2015  . Thickened endometrium 01/15/2015  . PMB (postmenopausal bleeding) 11/26/2014  . Urge incontinence 11/26/2014  . Cerebral embolism with cerebral infarction 07/27/2014  . Left leg weakness 07/26/2014  . Right frontal lobe lesion 07/26/2014  . Hyperlipidemia 03/13/2013  . Sleep apnea 03/13/2013  . New onset atrial fibrillation 03/13/2013  . Near syncope 03/13/2013  . Atrial fibrillation 03/12/2013  . HYPERCHOLESTEROLEMIA 05/09/2009  . SINUSITIS 05/09/2009  . BUNION, RIGHT FOOT 05/09/2009  . SHINGLES 11/07/2008  . SUPRASPINATUS TENDINITIS 08/28/2008  . DIABETES MELLITUS, CONTROLLED 08/21/2008  . SHOULDER PAIN, LEFT 08/21/2008  . SPRAIN AND STRAIN OF STERNOCLAVICULAR 07/04/2008  . OBESITY, MORBID 02/02/2008  . PROTEINURIA 02/02/2008  . DM (diabetes mellitus), type 2 with complications 08/65/7846  . ANEMIA 12/20/2007  . Essential hypertension, benign 12/20/2007  . ASTHMA 12/20/2007  . Unspecified arthropathy, lower leg 12/20/2007   Past Medical History:  Past Medical History  Diagnosis Date  . Diabetes mellitus   . Asthma   . Chronic knee pain   . Back pain, chronic   . Hypertension   . Vertigo   . Hyperlipidemia   .  TIA (transient ischemic attack)   . Anemia   . HOH (hard of hearing)   . Arthritis   . Obesity   . PMB (postmenopausal bleeding) 11/26/2014  . Urge incontinence 11/26/2014  . Thickened endometrium 01/15/2015  . Dysrhythmia   . Stroke     Mini Stroke   Past Surgical History:  Past Surgical History  Procedure Laterality Date  . Appendectomy    . Ovary surgery    . Cataract extraction w/phaco Left 01/16/2013    Procedure: CATARACT EXTRACTION PHACO AND INTRAOCULAR LENS PLACEMENT (IOC);  Surgeon: Elta Guadeloupe T. Gershon Crane, MD;  Location: [facility];  Service: Ophthalmology;  Laterality: Left;  CDE:14.37  . Endometrial biopsy  04/03/2013       . Hysteroscopy w/d&c N/A 04/24/2013    Procedure: SUCTION DILATATION AND CURETTAGE /HYSTEROSCOPY;  Surgeon: Jonnie Kind, MD;  Location: [facility];  Service: Gynecology;  Laterality: N/A;  . Knee arthroscopy with medial menisectomy Left 07/26/2013    Procedure: KNEE ARTHROSCOPY WITH PARTIAL MEDIAL MENISECTOMY;  Surgeon: Sanjuana Kava, MD;  Location: [facility];  Service: Orthopedics;  Laterality: Left;  . Endometrial ablation    . Colonoscopy  2009    Dr. Wilford Corner: 4 hyperplastic polyps removed. Small internal hemorrhoids. Recommended 5 year follow-up colonoscopy.  . Colonoscopy N/A 02/24/2015    Procedure: COLONOSCOPY;  Surgeon: Danie Binder, MD;  [facility];  Service: Endoscopy;  Laterality: N/A;  1030  . Hysteroscopy w/d&c N/A 04/02/2015    Procedure: UTERINE CURETTAGE, HYSTEROSCOPY;  Surgeon: Florian Buff, MD;  Location: [facility];  Service: Gynecology;  Laterality: N/A;   Social History:  reports that she has quit smoking. Her smoking use included Cigarettes. She has a 7.5 pack-year smoking history. She has never used smokeless tobacco. She reports that she does not drink alcohol or use illicit drugs.  Family / Support Systems Marital Status: Widow/Widower Patient Roles: Parent Children: Psychiatric nurse [phone] Other Supports:  William-son [phone]-home  [phone]-cell Anticipated Caregiver: daughter and friends Ability/Limitations of Caregiver: Daughter works daytime but may take FMLA to assist Caregiver Availability: Other (Comment) (Awaiting team's goals) Family Dynamics: Close knit wiht daughter and her Richardine Service is moving into to daughter's home when discharged from hospital.  Daughter recenlty moved into her dream home and had planned to have pt live with her and her husband.  Pt's son causes her dram at times and she feels she does not need this at the age she is., so doesn't spend much time with him.  Social History Preferred language: English Religion: Christian Cultural Background: No issues Education: High School Read: Yes Write: Yes Employment Status: Retired Freight forwarder Issues: No issues Guardian/Conservator: None-according to MD pt is capable of mkaing her decisions while here.   Abuse/Neglect Physical Abuse: Denies Verbal Abuse: Denies Sexual Abuse: Denies Exploitation of patient/patient's resources: Denies Self-Neglect: Denies  Emotional Status Pt's affect, behavior adn adjustment status: Pt is motivted to make improvements she has always been independent before this happened.  She does have knee issues which has limited her mobility but she did manage. She would like to get back to using a walker or cane to get around.  She has made some progress already which is encopuraging to her. Recent Psychosocial Issues: Other health issues-knee issues Pyschiatric History: No history deferred depression screen at this time due to pt appears to appropriately coping with her stroke and utilizes her faith to pull her through difficult times.  She is optimistic and motivated to do well on CIR Substance Abuse History: No issues  Patient / Family Perceptions, Expectations & Goals Pt/Family understanding of illness & functional limitations: Pt and daughter have a good understanding of her  stroke and deficits.  They talk with the MD when he rounds and are postive she will do well here. Encouraged daughter to attend therapies with pt to see her progress when here. Premorbid pt/family roles/activities: Mother, grandmother, retiree, chruch member, friend, etc Anticipated changes in roles/activities/participation: resume Pt/family expectations/goals: Pt states: " I want to be able to move around by myself, so when my daughter is at work, I can stay alone."  Daughter states: " I hope she can get to the point she is safe alone at home during the day."  [employer]: None Premorbid Home Care/DME Agencies: None Transportation available at discharge: Environmental consultant referrals recommended: Support group (specify)  Discharge Planning Living Arrangements: Children Support Systems: Children, Other relatives, Water engineer, Social worker community Type of Residence: Private residence Insurance Resources: Multimedia programmer (specify) ([employer] Medicare) Museum/gallery curator Resources: Fish farm manager, Family Support Financial Screen Referred: No Living Expenses: Lives with family Money Management: Patient, Family Does the patient have any problems obtaining your medications?: No Home Management: Daughter-pt helped with some of the home management Patient/Family Preliminary Plans: Return to daughter's home depends upon the amount of care she requires, daughter may take FMLA, but works during the day. Will await team's evaluatons to develop a safe discharge plan. Social Work Anticipated Follow Up Needs: HH/OP, Support Group  Clinical Impression Pleasant female who is willing to work in therapies to recover from this stroke.  She had a  bad knee which limited her, so team needs to be aware of this. Supportive daughter and her family will Assist upon discharge.  Daughter left FMLA forms for MD to complete. Work on a safe discharge plan, await goals.  Elease Hashimoto 05/15/2015, 2:10 PM

## 2015-05-15 NOTE — Care Management Note (Signed)
Inpatient [facility] Individual Statement of Services  Patient Name:  [name]  Date:  05/15/2015  Welcome to the [facility].  Our goal is to provide you with an individualized program based on your diagnosis and situation, designed to meet your specific needs.  With this comprehensive rehabilitation program, you will be expected to participate in at least 3 hours of rehabilitation therapies Monday-Friday, with modified therapy programming on the weekends.  Your rehabilitation program will include the following services:  Physical Therapy (PT), Occupational Therapy (OT), Speech Therapy (ST), 24 hour per day rehabilitation nursing, Therapeutic Recreaction (TR), Case Management (Social Worker), Rehabilitation Medicine, Nutrition Services and Pharmacy Services  Weekly team conferences will be held on Wednesday to discuss your progress.  Your Social Worker will talk with you frequently to get your input and to update you on team discussions.  Team conferences with you and your family in attendance may also be held.  Expected length of stay: 7-10 DAYS  Overall anticipated outcome: MOD/I LEVEL  Depending on your progress and recovery, your program may change. Your Social Worker will coordinate services and will keep you informed of any changes. Your Social Worker's name and contact numbers are listed  below.  The following services may also be recommended but are not provided by the [facility]:    [place] will be made to provide these services after discharge if needed.  Arrangements include referral to agencies that provide these services.  Your insurance has been verified to be:  [employer] Your primary doctor is:  Dr Brandon Melnick  Pertinent information will be shared with your doctor and your insurance company.  Social Worker:  Ovidio Kin, [place] or ([phone]  Information discussed with and copy given to patient by: Elease Hashimoto, 05/15/2015, 9:42 AM

## 2015-05-15 NOTE — Progress Notes (Signed)
Occupational Therapy Session Note  Patient Details  Name: [name] MRN: [id number] Date of Birth: [birth year]  Today's Date: 05/15/2015 OT Individual Time: 1430-1500 OT Individual Time Calculation (min): 30 min    Skilled Therapeutic Interventions/Progress Updates:    Pt seen for OT therapy focusing on neuromuscular re-education and education. Pt in recliner upon arrival, with hand off from PT. Pt with noted increased edema in L UE. Retrograde massage performed with education provided regarding purpose and technique to complete. PROM performed to all joints of L UE. Pt completed self range of motion, holding L hand with R hand and performed overhead shoulder flexion x10 reps. Pt then educated regarding integrating L UE into functional tasks including using L hand to stabilize while opening containers. Pt able to open water bottle and soap bottle while stabilizing with L UE. Pt requested return to bed at end of session. She stood from recliner with supervision and ambulated ~75f with SPC to bed, and completed bed mobility with supervision. Pt's L UE propped up on pillow, bed alarm on, and all needs in reach.   Kinesiotape applied to pt's L deltoid to facilitate proper positioning of L UE shoulder girdle as pt with decreased discomfort in shoulder from weight of L UE when ambulating. Will follow up with PT regarding UE support when walking.  Pt educated regarding role of OT, POC, CIR, neuromuscular re-education, use and purpose of kinesiotaping, and d/c planning.   Therapy Documentation Precautions:  Precautions Precautions: Fall Restrictions Weight Bearing Restrictions: No Pain: Pain Assessment Pain Assessment: No/denies pain  See FIM for current functional status  Therapy/Group: Individual Therapy  Lewis, Tanessa Tidd C 05/15/2015, 12:53 PM

## 2015-05-15 NOTE — IPOC Note (Signed)
Overall Plan of Care [facility]) Patient Details Name: [name] MRN: [id number] DOB: May 16, 1941  Admitting Diagnosis: cva  Hospital Problems: Active Problems:   Embolic cerebral infarction     Functional Problem List: Nursing Endurance, Safety, Skin Integrity  PT Balance, Edema, Endurance, Motor, Pain, Safety, Sensory, Skin Integrity  OT Balance, Pain, Edema, Safety, Endurance, Motor  SLP    TR         Basic ADL's: OT Grooming, Bathing, Dressing, Toileting     Advanced  ADL's: OT  (n/a)     Transfers: PT Bed Mobility, Bed to Chair, Car, Manufacturing systems engineer, Metallurgist: PT Ambulation, Stairs     Additional Impairments: OT Fuctional Use of Upper Extremity  SLP        TR      Anticipated Outcomes Item Anticipated Outcome  Self Feeding set up A  Swallowing      Basic self-care  Mod I   Toileting  Mod I    Bathroom Transfers Mod I - toileting, supervision - shower  Bowel/Bladder  Continent to bowel and bladder.  Transfers  mod I basic; S car  Locomotion  mod I household gait; S stairs  Communication     Cognition     Pain  Less than 3 on scale 1 to 10  Safety/Judgment  Pt. will be free from falls during her stay in rehab.   Therapy Plan: PT Intensity: Minimum of 1-2 x/day ,45 to 90 minutes PT Frequency: 5 out of 7 days PT Duration Estimated Length of Stay: 7-10 days OT Intensity: Minimum of 1-2 x/day, 45 to 90 minutes OT Frequency: 5 out of 7 days OT Duration/Estimated Length of Stay: 7-10 days         Team Interventions: Nursing Interventions Discharge Planning, Patient/Family Education  PT interventions Ambulation/gait training, Training and development officer, Community reintegration, Discharge planning, Disease management/prevention, Engineer, drilling, Functional mobility training, Neuromuscular re-education, Pain management, Patient/family education, Psychosocial support, Skin care/wound management,  Splinting/orthotics, Stair training, Therapeutic Activities, Therapeutic Exercise, UE/LE Strength taining/ROM, UE/LE Coordination activities, Wheelchair propulsion/positioning  OT Interventions Training and development officer, Academic librarian, Technical sales engineer stimulation, Neuromuscular re-education, Patient/family education, Self Care/advanced ADL retraining, Therapeutic Exercise, UE/LE Coordination activities, UE/LE Strength taining/ROM, Therapeutic Activities, Pain management, Functional mobility training, DME/adaptive equipment instruction, Discharge planning  SLP Interventions    TR Interventions    SW/CM Interventions Discharge Planning, Psychosocial Support, Patient/Family Education    Team Discharge Planning: Destination: PT-Home ,OT- Home , SLP-  Projected Follow-up: PT-Home health PT, 24 hour supervision/assistance, OT-  Home health OT, Outpatient OT, 24 hour supervision/assistance, SLP-  Projected Equipment Needs: PT-None recommended by PT, OT- To be determined, SLP-  Equipment Details: PT-Pt owns SPC and RW, OT-  Patient/family involved in discharge planning: PT- Patient,  OT-Patient, SLP-   MD ELOS: 10-14d Medical Rehab Prognosis:  Good Assessment: 74 y.o. right handed female with history of atrial fibrillation maintained on aspirin 81 mg daily, diabetes mellitus, hypertension, right ACA infarct August 2015 and had initially been placed on Eliquis but discontinued due to vaginal bleeding. Patient lives with her daughter and used a cane and sometimes a walker prior to admission. Presented 05/10/2015 with left-sided weakness. MRI of the brain showed acute nonhemorrhagic infarct right caudate, acute nonhemorrhagic infarct parasagittal distribution within the right frontal lobe and anterior aspect of the right parietal lobe. Remote anterior right frontal lobe/ genu of the corpus callosum. Remote small basal ganglia infarcts bilaterally. Moderate small vessel disease type changes.  MRA of the head with high-grade stenosis proximal right middle cerebral artery branches just beyond the bifurcation. Carotid Dopplers with no ICA stenosis.   Now requiring 24/7 Rehab RN,MD, as well as CIR level PT, OT .  Treatment team will focus on ADLs and mobility with goals set at Mod I See Team Conference Notes for weekly updates to the plan of care

## 2015-05-15 NOTE — H&P (View-Only) (Signed)
Physical Medicine and Rehabilitation Admission H&P   Chief Complaint  Patient presents with  . Left sided weakness  : HPI: [name] is a 74 y.o. right handed female with history of atrial fibrillation maintained on aspirin 81 mg daily, diabetes mellitus, hypertension, right ACA infarct August 2015 and had initially been placed on Eliquis but discontinued due to vaginal bleeding. Patient lives with her daughter and used a cane and sometimes a walker prior to admission. Presented 05/10/2015 with left-sided weakness. MRI of the brain showed acute nonhemorrhagic infarct right caudate, acute nonhemorrhagic infarct parasagittal distribution within the right frontal lobe and anterior aspect of the right parietal lobe. Remote anterior right frontal lobe/ genu of the corpus callosum. Remote small basal ganglia infarcts bilaterally. Moderate small vessel disease type changes. MRA of the head with high-grade stenosis proximal right middle cerebral artery branches just beyond the bifurcation. Carotid Dopplers with no ICA stenosis. . Patient did not receive TPA. Echocardiogram with ejection fraction of 63% grade 1 diastolic dysfunction without emboli. EEG negative for seizure. Neurology consulted currently maintained on intravenous heparin for stroke prophylaxis and transitioned to Eliquis.. Regular consistency diet. Physical therapy evaluation completed 05/12/2015 with recommendations of physical medicine rehabilitation consult. Patient was admitted for comprehensive rehabilitation program  Patient states that she can move her left hand better today Patient notes that even prior to her stroke she took a long time to dress herself  Review of Systems  Constitutional: Negative for fever and chills.  HENT: Positive for hearing loss.  Eyes: Negative for double vision.  Respiratory: Negative for cough.   Shortness of breath on exertion  Cardiovascular: Positive for palpitations. Negative for  leg swelling.  Gastrointestinal: Positive for constipation.  Genitourinary: Positive for urgency.   Vaginal bleeding  Musculoskeletal: Positive for myalgias, back pain and joint pain.  Neurological: Positive for headaches. Negative for dizziness.   Past Medical History  Diagnosis Date  . Diabetes mellitus   . Asthma   . Chronic knee pain   . Back pain, chronic   . Hypertension   . Vertigo   . Hyperlipidemia   . TIA (transient ischemic attack)   . Anemia   . HOH (hard of hearing)   . Arthritis   . Obesity   . PMB (postmenopausal bleeding) 11/26/2014  . Urge incontinence 11/26/2014  . Thickened endometrium 01/15/2015  . Dysrhythmia   . Stroke     Mini Stroke   Past Surgical History  Procedure Laterality Date  . Appendectomy    . Ovary surgery    . Cataract extraction w/phaco Left 01/16/2013    Procedure: CATARACT EXTRACTION PHACO AND INTRAOCULAR LENS PLACEMENT (IOC); Surgeon: Elta Guadeloupe T. Gershon Crane, MD; Location: [facility]; Service: Ophthalmology; Laterality: Left; CDE:14.37  . Endometrial biopsy  04/03/2013      . Hysteroscopy w/d&c N/A 04/24/2013    Procedure: SUCTION DILATATION AND CURETTAGE /HYSTEROSCOPY; Surgeon: Jonnie Kind, MD; Location: [facility]; Service: Gynecology; Laterality: N/A;  . Knee arthroscopy with medial menisectomy Left 07/26/2013    Procedure: KNEE ARTHROSCOPY WITH PARTIAL MEDIAL MENISECTOMY; Surgeon: Sanjuana Kava, MD; Location: [facility]; Service: Orthopedics; Laterality: Left;  . Endometrial ablation    . Colonoscopy  2009    Dr. Wilford Corner: 4 hyperplastic polyps removed. Small internal hemorrhoids. Recommended 5 year follow-up colonoscopy.  . Colonoscopy N/A 02/24/2015    Procedure: COLONOSCOPY; Surgeon: Danie Binder, MD; Location: [facility]; Service: Endoscopy; Laterality: N/A; 1030  . Hysteroscopy w/d&c N/A  04/02/2015    Procedure:  UTERINE CURETTAGE, HYSTEROSCOPY; Surgeon: Florian Buff, MD; Location: [facility]; Service: Gynecology; Laterality: N/A;   Family History  Problem Relation Age of Onset  . Arrhythmia Father     Atrial fib/Pacemaker  . Heart failure Mother   . Diabetes Mother   . Other Mother     fell and broke hip  . Diabetes Sister   . Lupus Daughter   . Mental illness Son   . ADD / ADHD Son   . Other Son     soft bones  . Diabetes Maternal Grandmother   . Stroke Paternal Grandmother   . Arthritis Paternal Grandfather   . Other Sister     MVA  . Hypertension Brother   . Diabetes Sister   . Hypertension Sister   . Colon cancer Neg Hx    Social History:  reports that she has quit smoking. Her smoking use included Cigarettes. She has a 7.5 pack-year smoking history. She has never used smokeless tobacco. She reports that she does not drink alcohol or use illicit drugs. Allergies:  Allergies  Allergen Reactions  . Imitrex [Sumatriptan] Other (See Comments)    Unknown Reaction   . Mango Flavor     Nausea and vomiting  . Oysters [Shellfish Allergy]     Nausea and vomiting   Medications Prior to Admission  Medication Sig Dispense Refill  . acetaminophen (TYLENOL) 325 MG tablet Take 650 mg by mouth every 6 (six) hours as needed for mild pain.     Marland Kitchen aspirin 81 MG tablet Take 81 mg by mouth daily.    Marland Kitchen CARTIA XT 120 MG 24 hr capsule Take 120 mg by mouth daily.     . cholecalciferol (VITAMIN D) 1000 UNITS tablet Take 1,000 Units by mouth every morning.     . hydrOXYzine (VISTARIL) 25 MG capsule Take 4 capsules (100 mg total) by mouth 3 (three) times daily as needed for itching. 60 capsule 0  . Linaclotide (LINZESS) 145 MCG CAPS capsule 1 PO 30 mins prior to your first meal (Patient taking differently: Take 145 mcg by mouth daily. ) 30  capsule 11  . medroxyPROGESTERone (PROVERA) 10 MG tablet Take 1 tablet (10 mg total) by mouth daily. 30 tablet 11  . naproxen sodium (ALEVE) 220 MG tablet Take 220 mg by mouth 2 (two) times daily as needed (for pain).     . simvastatin (ZOCOR) 20 MG tablet Take 20 mg by mouth at bedtime.     . TRADJENTA 5 MG TABS tablet Take 5 mg by mouth daily.   0  . TOVIAZ 4 MG TB24 tablet Take 1 tablet by mouth daily.    . [DISCONTINUED] HYDROcodone-acetaminophen (NORCO/VICODIN) 5-325 MG per tablet Take 1 tablet by mouth every 6 (six) hours as needed. (Patient not taking: Reported on 05/10/2015) 30 tablet 0  . [DISCONTINUED] ketorolac (TORADOL) 10 MG tablet Take 1 tablet (10 mg total) by mouth every 8 (eight) hours as needed. (Patient not taking: Reported on 04/09/2015) 15 tablet 0  . [DISCONTINUED] ondansetron (ZOFRAN) 8 MG tablet Take 1 tablet (8 mg total) by mouth every 8 (eight) hours as needed for nausea. (Patient not taking: Reported on 04/09/2015) 12 tablet 0  . [DISCONTINUED] valACYclovir (VALTREX) 1000 MG tablet Take 1 tablet (1,000 mg total) by mouth 3 (three) times daily. (Patient not taking: Reported on 04/09/2015) 30 tablet 1    Home: Home Living Family/patient expects to be discharged to:: Inpatient rehab Living Arrangements: Children Available Help at Discharge: Family,  Available 24 hours/day Type of Home: House  Functional History: Prior Function Level of Independence: Independent with assistive device(s)  Functional Status:  Mobility: Bed Mobility Overal bed mobility: Needs Assistance Bed Mobility: Supine to Sit Rolling: Min guard Supine to sit: Min assist, HOB elevated Sit to supine: Mod assist General bed mobility comments: Min assist for trucal support with HOB elevated and instructions for use of bed rail as able. Transfers Overall transfer level: Needs assistance Equipment used: Rolling walker (2 wheeled), 1 person hand held  assist Transfers: Sit to/from Stand, Stand Pivot Transfers Sit to Stand: Min assist Stand pivot transfers: Min assist General transfer comment: Min assist for boost to stand and LUE support performed from lowest bed setting with RW, and with hand held assist from recliner. Min assist for walker control with pivot towards chair. Cues for directions with technique, pt showing some difficulty following commands and slightly impulsive with turn, however did not note significant Lt knee buckling. Took several pivotal steps towards her right side.      ADL: ADL Overall ADL's : Needs assistance/impaired Upper Body Dressing : Moderate assistance, Sitting Lower Body Dressing: Maximal assistance, Sit to/from stand Toilet Transfer: Min guard, Stand-pivot, BSC Toileting- Clothing Manipulation and Hygiene: Maximal assistance, Sit to/from stand Functional mobility during ADLs: Min guard (stand pivot) General ADL Comments: Pt tranferred to [facility] and had BM. Assisted in toilet hygiene and educated on what pt could use for toilet aide. Educated on dressing technique. Educated on being aware of LUE and supporting it on pillow-placed pillow under left arm at end of session.  Cognition: Cognition Overall Cognitive Status: Within Functional Limits for tasks assessed Arousal/Alertness: Awake/alert Orientation Level: Oriented X4 Attention: Sustained Sustained Attention: Appears intact Memory: Appears intact Safety/Judgment: Appears intact Cognition Arousal/Alertness: Awake/alert Behavior During Therapy: WFL for tasks assessed/performed Overall Cognitive Status: Within Functional Limits for tasks assessed  Physical Exam: Blood pressure 123/59, pulse 68, temperature 98.9 F (37.2 C), temperature source Oral, resp. rate 16, height '4\' 11"'$  (1.499 m), weight 132.224 kg (291 lb 8 oz), last menstrual period 03/31/2013, SpO2 99 %. Physical Exam Constitutional: She is oriented to person, place, and time.   74 year old obese female  HENT:  Head: Normocephalic.  Eyes: EOM are normal.  Neck: Normal range of motion. Neck supple. No thyromegaly present.  Cardiovascular: Normal rate and regular rhythm.  Respiratory: Effort normal and breath sounds normal. No respiratory distress.  GI: Soft. Bowel sounds are normal. She exhibits no distension.  Neurological: She is alert and oriented to person, place, and time.  Skin: Skin is warm and dry.  Motor strength is 5/5 in the right deltoid, biceps, triceps, grip, right hip flexor and knee extensor and ankle dorsiflexor plantar flexion 0/5 in the left deltoid, biceps, triceps, 2 minus grip, 2 minus wrist flexion 3/5 in the left hip flexor and extensor ankle dorsiflexor and plantar flexor Sensation Intact to light touch in left upper extremity Sensation intact to light touch in the left lower extremity as well as on the right side Tone is Increased in the left upper extremity and normal in the left lower extremity    Lab Results Last 48 Hours    Results for orders placed or performed during the hospital encounter of 05/10/15 (from the past 48 hour(s))  Glucose, capillary Status: None   Collection Time: 05/12/15 8:10 AM  Result Value Ref Range   Glucose-Capillary 78 65 - 99 mg/dL  Glucose, capillary Status: None   Collection Time: 05/12/15 11:48 AM  Result Value Ref Range   Glucose-Capillary 93 65 - 99 mg/dL  Glucose, capillary Status: None   Collection Time: 05/12/15 4:31 PM  Result Value Ref Range   Glucose-Capillary 97 65 - 99 mg/dL  Heparin level (unfractionated) Status: None   Collection Time: 05/12/15 7:46 PM  Result Value Ref Range   Heparin Unfractionated 0.31 0.30 - 0.70 IU/mL    Comment:   IF HEPARIN RESULTS ARE BELOW EXPECTED VALUES, AND PATIENT DOSAGE HAS BEEN CONFIRMED, SUGGEST FOLLOW UP TESTING OF ANTITHROMBIN III LEVELS.   Glucose, capillary  Status: None   Collection Time: 05/12/15 9:09 PM  Result Value Ref Range   Glucose-Capillary 93 65 - 99 mg/dL  Heparin level (unfractionated) Status: None   Collection Time: 05/13/15 3:56 AM  Result Value Ref Range   Heparin Unfractionated 0.45 0.30 - 0.70 IU/mL    Comment:   IF HEPARIN RESULTS ARE BELOW EXPECTED VALUES, AND PATIENT DOSAGE HAS BEEN CONFIRMED, SUGGEST FOLLOW UP TESTING OF ANTITHROMBIN III LEVELS.   CBC Status: Abnormal   Collection Time: 05/13/15 3:56 AM  Result Value Ref Range   WBC 3.7 (L) 4.0 - 10.5 K/uL   RBC 3.32 (L) 3.87 - 5.11 MIL/uL   Hemoglobin 10.9 (L) 12.0 - 15.0 g/dL   HCT 32.8 (L) 36.0 - 46.0 %   MCV 98.8 78.0 - 100.0 fL   MCH 32.8 26.0 - 34.0 pg   MCHC 33.2 30.0 - 36.0 g/dL   RDW 13.4 11.5 - 15.5 %   Platelets 168 150 - 400 K/uL  Glucose, capillary Status: None   Collection Time: 05/13/15 7:32 AM  Result Value Ref Range   Glucose-Capillary 89 65 - 99 mg/dL  Glucose, capillary Status: Abnormal   Collection Time: 05/13/15 11:13 AM  Result Value Ref Range   Glucose-Capillary 100 (H) 65 - 99 mg/dL  Heparin level (unfractionated) Status: Abnormal   Collection Time: 05/13/15 12:46 PM  Result Value Ref Range   Heparin Unfractionated 0.76 (H) 0.30 - 0.70 IU/mL    Comment:   IF HEPARIN RESULTS ARE BELOW EXPECTED VALUES, AND PATIENT DOSAGE HAS BEEN CONFIRMED, SUGGEST FOLLOW UP TESTING OF ANTITHROMBIN III LEVELS.   Glucose, capillary Status: None   Collection Time: 05/13/15 5:08 PM  Result Value Ref Range   Glucose-Capillary 78 65 - 99 mg/dL  Heparin level (unfractionated) Status: None   Collection Time: 05/13/15 7:55 PM  Result Value Ref Range   Heparin Unfractionated 0.61 0.30 - 0.70 IU/mL    Comment:   IF HEPARIN RESULTS ARE BELOW EXPECTED VALUES, AND PATIENT DOSAGE  HAS BEEN CONFIRMED, SUGGEST FOLLOW UP TESTING OF ANTITHROMBIN III LEVELS.   Glucose, capillary Status: None   Collection Time: 05/13/15 10:09 PM  Result Value Ref Range   Glucose-Capillary 86 65 - 99 mg/dL      Imaging Results (Last 48 hours)    No results found.       Medical Problem List and Plan: 1. 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 DVT Prophylaxis/Anticoagulation: Intravenous heparin and transitioned to Eliquis. Monitor for any bleeding episodes 3. Pain Management: Tylenol as needed 4. Atrial fibrillation. Continue Cardizem 120 mg daily. Cardiac rate control 5. Neuropsych: This patient is capable of making decisions on her own behalf. 6. Skin/Wound Care: Routine skin checks 7. Fluids/Electrolytes/Nutrition: Routine I&O with follow-up chemistries 8. Diabetes mellitus with peripheral neuropathy. Hemoglobin A1c 6.1. Currently on sliding scale insulin. Check CBGs before meals and at bedtime. Patient onTradjenta 5  mg daily prior to admission. Resume as tolerated 9. History of vaginal bleeding. Monitor 10. Chronic constipation. Continue Linzess 11. Hyperlipidemia. Lipitor  Post Admission Physician Evaluation: Functional deficits secondary to right caudate and right frontal lobe infarction likely embolic. 1. Patient is admitted to receive collaborative, interdisciplinary care between the physiatrist, rehab nursing staff, and therapy team. 2. Patient's level of medical complexity and substantial therapy needs in context of that medical necessity cannot be provided at a lesser intensity of care such as a SNF. 3. Patient has experienced substantial functional loss from his/her baseline which was documented above under the "Functional History" and "Functional Status" headings. Judging by the patient's diagnosis, physical exam, and functional history, the patient has potential for functional progress which  will result in measurable gains while on inpatient rehab. These gains will be of substantial and practical use upon discharge in facilitating mobility and self-care at the household level. 4. Physiatrist will provide 24 hour management of medical needs as well as oversight of the therapy plan/treatment and provide guidance as appropriate regarding the interaction of the two. 5. 24 hour rehab nursing will assist with bladder management, bowel management, safety, skin/wound care, disease management, medication administration, pain management and patient education and help integrate therapy concepts, techniques,education, etc. 6. PT will assess and treat for/with: pre gait, gait training, endurance , safety, equipment, neuromuscular re education. Goals are: Min A. 7. OT will assess and treat for/with: ADLs, Cognitive perceptual skills, Neuromuscular re education, safety, endurance, equipment. Goals are: Min A. Therapy may proceed with showering this patient. 8. SLP will assess and treat for/with: NA. Goals are: NA. 9. Case Management and Social Worker will assess and treat for psychological issues and discharge planning. 10. Team conference will be held weekly to assess progress toward goals and to determine barriers to discharge. 11. Patient will receive at least 3 hours of therapy per day at least 5 days per week. 12. ELOS: 10-14d  13. Prognosis: excellent     Charlett Blake M.D. [facility] FAAPM&R (Sports Med, Neuromuscular Med) Diplomate Am Board of Electrodiagnostic Med  05/14/2015

## 2015-05-16 ENCOUNTER — Inpatient Hospital Stay (HOSPITAL_COMMUNITY): Payer: Commercial Managed Care - HMO | Admitting: Rehabilitation

## 2015-05-16 ENCOUNTER — Inpatient Hospital Stay (HOSPITAL_COMMUNITY): Payer: Commercial Managed Care - HMO | Admitting: Occupational Therapy

## 2015-05-16 LAB — GLUCOSE, CAPILLARY
Glucose-Capillary: 89 mg/dL (ref 65–99)
Glucose-Capillary: 104 mg/dL — ABNORMAL HIGH (ref 65–99)
Glucose-Capillary: 104 mg/dL — ABNORMAL HIGH (ref 65–99)
Glucose-Capillary: 110 mg/dL — ABNORMAL HIGH (ref 65–99)

## 2015-05-16 NOTE — Progress Notes (Signed)
Subjective/Complaints: Up with therapy. Denies new issues this morning. ROS:  No cough, no SOB , off O2,  No nausea or vomiting  Having BMs last one 6/7 Objective: Vital Signs: Blood pressure 150/78, pulse 55, temperature 98.1 F (36.7 C), temperature source Oral, resp. rate 18, last menstrual period 03/31/2013, SpO2 98 %. No results found. Results for orders placed or performed during the hospital encounter of 05/14/15 (from the past 72 hour(s))  Glucose, capillary     Status: Abnormal   Collection Time: 05/14/15  9:11 PM  Result Value Ref Range   Glucose-Capillary 103 (H) 65 - 99 mg/dL   Comment 1 Notify RN   CBC WITH DIFFERENTIAL     Status: Abnormal   Collection Time: 05/15/15  6:01 AM  Result Value Ref Range   WBC 3.7 (L) 4.0 - 10.5 K/uL   RBC 3.27 (L) 3.87 - 5.11 MIL/uL   Hemoglobin 10.8 (L) 12.0 - 15.0 g/dL   HCT 32.1 (L) 36.0 - 46.0 %   MCV 98.2 78.0 - 100.0 fL   MCH 33.0 26.0 - 34.0 pg   MCHC 33.6 30.0 - 36.0 g/dL   RDW 13.3 11.5 - 15.5 %   Platelets 179 150 - 400 K/uL   Neutrophils Relative % 56 43 - 77 %   Neutro Abs 2.1 1.7 - 7.7 K/uL   Lymphocytes Relative 34 12 - 46 %   Lymphs Abs 1.3 0.7 - 4.0 K/uL   Monocytes Relative 9 3 - 12 %   Monocytes Absolute 0.3 0.1 - 1.0 K/uL   Eosinophils Relative 1 0 - 5 %   Eosinophils Absolute 0.1 0.0 - 0.7 K/uL   Basophils Relative 0 0 - 1 %   Basophils Absolute 0.0 0.0 - 0.1 K/uL  Comprehensive metabolic panel     Status: Abnormal   Collection Time: 05/15/15  6:01 AM  Result Value Ref Range   Sodium 138 135 - 145 mmol/L   Potassium 4.1 3.5 - 5.1 mmol/L   Chloride 108 101 - 111 mmol/L   CO2 23 22 - 32 mmol/L   Glucose, Bld 90 65 - 99 mg/dL   BUN 15 6 - 20 mg/dL   Creatinine, Ser 0.90 0.44 - 1.00 mg/dL   Calcium 8.7 (L) 8.9 - 10.3 mg/dL   Total Protein 6.0 (L) 6.5 - 8.1 g/dL   Albumin 2.8 (L) 3.5 - 5.0 g/dL   AST 17 15 - 41 U/L   ALT 13 (L) 14 - 54 U/L   Alkaline Phosphatase 38 38 - 126 U/L   Total Bilirubin 0.5 0.3  - 1.2 mg/dL   GFR calc non Af Amer >60 >60 mL/min   GFR calc Af Amer >60 >60 mL/min    Comment: (NOTE) The eGFR has been calculated using the CKD EPI equation. This calculation has not been validated in all clinical situations. eGFR's persistently <60 mL/min signify possible Chronic Kidney Disease.    Anion gap 7 5 - 15  Glucose, capillary     Status: None   Collection Time: 05/15/15  6:50 AM  Result Value Ref Range   Glucose-Capillary 88 65 - 99 mg/dL  Glucose, capillary     Status: Abnormal   Collection Time: 05/15/15 11:43 AM  Result Value Ref Range   Glucose-Capillary 101 (H) 65 - 99 mg/dL  Urinalysis, Routine w reflex microscopic (not at [facility])     Status: Abnormal   Collection Time: 05/15/15  1:30 PM  Result Value Ref Range  Color, Urine YELLOW YELLOW   APPearance CLEAR CLEAR   Specific Gravity, Urine 1.022 1.005 - 1.030   pH 5.5 5.0 - 8.0   Glucose, UA NEGATIVE NEGATIVE mg/dL   Hgb urine dipstick TRACE (A) NEGATIVE   Bilirubin Urine NEGATIVE NEGATIVE   Ketones, ur NEGATIVE NEGATIVE mg/dL   Protein, ur 30 (A) NEGATIVE mg/dL   Urobilinogen, UA 1.0 0.0 - 1.0 mg/dL   Nitrite NEGATIVE NEGATIVE   Leukocytes, UA NEGATIVE NEGATIVE  Urine microscopic-add on     Status: Abnormal   Collection Time: 05/15/15  1:30 PM  Result Value Ref Range   Squamous Epithelial / LPF FEW (A) RARE   RBC / HPF 0-2 <3 RBC/hpf  Glucose, capillary     Status: None   Collection Time: 05/15/15  4:36 PM  Result Value Ref Range   Glucose-Capillary 91 65 - 99 mg/dL  Glucose, capillary     Status: Abnormal   Collection Time: 05/15/15  9:01 PM  Result Value Ref Range   Glucose-Capillary 102 (H) 65 - 99 mg/dL  Glucose, capillary     Status: None   Collection Time: 05/16/15  7:05 AM  Result Value Ref Range   Glucose-Capillary 89 65 - 99 mg/dL     HEENT: normal Cardio: RRR and no murmur Resp: CTA B/L and unlabored GI: BS positive and NT, ND Extremity:  Edema Left hand dorsum edema Skin:    Intact Neuro: Alert/Oriented, Cranial Nerve II-XII normal, Normal Sensory, Abnormal Motor 5/5 on right side, 2- Left wrist flexor and finger flex, tr to 1- biceps, 0/5 left FE WE, 3/5 L HF, KE ankle DF and Abnormal FMC left side Musc/Skel:  Other no pain with Left shoulder ROM no pain with shoulder palpation Gen NAD, NT/Rote   Assessment/Plan: 1. 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 Physiatrist is providing close team supervision and 24 hour management of active medical problems listed below. Physiatrist and rehab team continue to assess barriers to discharge/monitor patient progress toward functional and medical goals. FIM: FIM - Bathing Bathing Steps Patient Completed: Chest, Abdomen, Right upper leg, Left upper leg Bathing: 2: Max-Patient completes 3-4 [id number] parts or 25-49%  FIM - Upper Body Dressing/Undressing Upper body dressing/undressing steps patient completed: Thread/unthread right sleeve of pullover shirt/dresss, Put head through opening of pull over shirt/dress Upper body dressing/undressing: 3: Mod-Patient completed 50-74% of tasks FIM - Lower Body Dressing/Undressing Lower body dressing/undressing steps patient completed: Thread/unthread left underwear leg, Thread/unthread right underwear leg Lower body dressing/undressing: 2: Max-Patient completed 25-49% of tasks  FIM - Toileting Toileting steps completed by patient: Adjust clothing prior to toileting Toileting: 2: Max-Patient completed 1 of 3 steps  FIM - TRadio producerDevices: Elevated toilet seat, Grab bars, WInsurance account managerTransfers: 3-To toilet/BSC: Mod A (lift or lower assist), 3-From toilet/BSC: Mod A (lift or lower assist)  FIM - BControl and instrumentation engineerDevices: Arm rests Bed/Chair  Transfer: 4: Supine > Sit: Min A (steadying Pt. > 75%/lift 1 leg), 4: Bed > Chair or W/C: Min A (steadying Pt. > 75%), 4: Chair or W/C > Bed: Min A (steadying Pt. > 75%)  FIM - Locomotion: Wheelchair Locomotion: Wheelchair: 1: Total Assistance/staff pushes wheelchair (Pt<25%) FIM - Locomotion: Ambulation Ambulation/Gait Assistance: 4: Min assist Locomotion: Ambulation: 1: Travels less than 50 ft with minimal assistance (Pt.>75%)  Comprehension Comprehension Mode: Auditory Comprehension: 5-Follows basic conversation/direction: With extra time/assistive device  Expression Expression Mode: Verbal Expression: 5-Expresses basic needs/ideas: With no assist  Social Interaction Social Interaction: 3-Interacts appropriately 50 - 74% of the time - May be physically or verbally inappropriate.  Problem Solving Problem Solving: 5-Solves basic problems: With no assist  Memory Memory: 6-More than reasonable amt of time  Medical Problem List and Plan: 1. 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 DVT Prophylaxis/Anticoagulation: Intravenous heparin and transitioned to Eliquis. Monitor for any bleeding episodes 3. Pain Management: Tylenol as needed 4. Atrial fibrillation. Continue Cardizem 120 mg daily. Cardiac rate control, rate and BP ok 5. Neuropsych: This patient is capable of making decisions on her own behalf. 6. Skin/Wound Care: Routine skin checks 7. Fluids/Electrolytes/Nutrition: Routine I&O with follow-up chemistries 8. Diabetes mellitus with peripheral neuropathy. Hemoglobin A1c 6.1. Currently on sliding scale insulin. Check CBGs before meals and at bedtime. Patient onTradjenta 5 mg daily prior to admission. Resume as tolerated 9. History of vaginal bleeding. Monitor on Eliquis 10. Chronic constipation. Continue Linzess 11. Hyperlipidemia. Lipitor 12 Neurogenic bladder. UA, cx negative, toviaz initiated. Check PVR's LOS (Days)  2 A FACE TO FACE EVALUATION WAS PERFORMED  [name] 05/16/2015, 8:42 AM

## 2015-05-16 NOTE — Discharge Instructions (Addendum)
Information on my medicine - ELIQUIS (apixaban)  This medication education was reviewed with me or my healthcare representative as part of my discharge preparation.    Why was Eliquis prescribed for you? Eliquis was prescribed for you to reduce the risk of a blood clot forming that can cause a stroke if you have a medical condition called atrial fibrillation (a type of irregular heartbeat).  What do You need to know about Eliquis ? Take your Eliquis '5mg'$  TWICE DAILY - one tablet in the morning and one tablet in the evening with or without food. If you have difficulty swallowing the tablet whole please discuss with your pharmacist how to take the medication safely.  Take Eliquis exactly as prescribed by your doctor and DO NOT stop taking Eliquis without talking to the doctor who prescribed the medication.  Stopping may increase your risk of developing a stroke.  Refill your prescription before you run out.  After discharge, you should have regular check-up appointments with your healthcare provider that is prescribing your Eliquis.  In the future your dose may need to be changed if your kidney function or weight changes by a significant amount or as you get older.  What do you do if you miss a dose? If you miss a dose, take it as soon as you remember on the same day and resume taking twice daily.  Do not take more than one dose of ELIQUIS at the same time to make up a missed dose.  Important Safety Information A possible side effect of Eliquis is bleeding. You should call your healthcare provider right away if you experience any of the following: ? Bleeding from an injury or your nose that does not stop. ? Unusual colored urine (red or dark brown) or unusual colored stools (red or black). ? Unusual bruising for unknown reasons. ? A serious fall or if you hit your head (even if there is no bleeding).  Some medicines may interact with Eliquis and might increase your risk of bleeding or  clotting while on Eliquis. To help avoid this, consult your healthcare provider or pharmacist prior to using any new prescription or non-prescription medications, including herbals, vitamins, non-steroidal anti-inflammatory drugs (NSAIDs) and supplements.  This website has more information on Eliquis (apixaban): [email or web address] Rehab Discharge Instructions  [name] Discharge date and time: No discharge date for patient encounter.   Activities/Precautions/ Functional Status: Activity: activity as tolerated Diet: diabetic diet Wound Care: none needed Functional status:  ___ No restrictions     ___ Walk up steps independently ___ 24/7 supervision/assistance   ___ Walk up steps with assistance ___ Intermittent supervision/assistance  ___ Bathe/dress independently ___ Walk with walker     ___ Bathe/dress with assistance ___ Walk Independently    ___ Shower independently _x__ Walk with assistance    ___ Shower with assistance ___ No alcohol     ___ Return to work/school ________  Special Instructions:    COMMUNITY REFERRALS UPON DISCHARGE:    Home Health:   [facility]   Date of last service:05/21/2015  Medical Equipment/Items Ordered:HAS ALL NEEDED EQUIPMENT     GENERAL COMMUNITY RESOURCES FOR PATIENT/FAMILY: Support Groups:[facility] SUPPORT GROUP  My questions have been answered and I understand these instructions. I will adhere to these goals and the provided educational materials after my discharge from the hospital.  Patient/Caregiver Signature _______________________________ Date __________  Clinician Signature _______________________________________ Date __________  Please bring this form and your medication  list with you to all your follow-up doctor's appointments.  STROKE/TIA DISCHARGE INSTRUCTIONS SMOKING Cigarette smoking nearly doubles your risk of having a stroke & is the single most  alterable risk factor  If you smoke or have smoked in the last 12 months, you are advised to quit smoking for your health.  Most of the excess cardiovascular risk related to smoking disappears within a year of stopping.  Ask you doctor about anti-smoking medications  [place] Quit Line: 1-800-QUIT NOW  Free Smoking Cessation Classes [phone]  CHOLESTEROL Know your levels; limit fat & cholesterol in your diet  Lipid Panel     Component Value Date/Time   CHOL 207* 05/11/2015 0253   TRIG 75 05/11/2015 0253   HDL 44 05/11/2015 0253   CHOLHDL 4.7 05/11/2015 0253   VLDL 15 05/11/2015 0253   LDLCALC 148* 05/11/2015 0253      Many patients benefit from treatment even if their cholesterol is at goal.  Goal: Total Cholesterol (CHOL) less than 160  Goal:  Triglycerides (TRIG) less than 150  Goal:  HDL greater than 40  Goal:  LDL (LDLCALC) less than 100   BLOOD PRESSURE American Stroke Association blood pressure target is less that 120/80 mm/Hg  Your discharge blood pressure is:  BP: (!) 160/76 mmHg  Monitor your blood pressure  Limit your salt and alcohol intake  Many individuals will require more than one medication for high blood pressure  DIABETES (A1c is a blood sugar average for last 3 months) Goal HGBA1c is under 7% (HBGA1c is blood sugar average for last 3 months)  Diabetes:    Lab Results  Component Value Date   HGBA1C 6.1* 05/11/2015     Your HGBA1c can be lowered with medications, healthy diet, and exercise.  Check your blood sugar as directed by your physician  Call your physician if you experience unexplained or low blood sugars.  PHYSICAL ACTIVITY/REHABILITATION Goal is 30 minutes at least 4 days per week  Activity: Increase activity slowly, Therapies: Physical Therapy: Home Health Return to work:   Activity decreases your risk of heart attack and stroke and makes your heart stronger.  It helps control your weight and blood pressure; helps you relax and can  improve your mood.  Participate in a regular exercise program.  Talk with your doctor about the best form of exercise for you (dancing, walking, swimming, cycling).  DIET/WEIGHT Goal is to maintain a healthy weight  Your discharge diet is: Diet Carb Modified Fluid consistency:: Thin; Room service appropriate?: Yes  liquids Your height is:    Your current weight is: Weight: 129.865 kg (286 lb 4.8 oz) Your Body Mass Index (BMI) is:     Following the type of diet specifically designed for you will help prevent another stroke.  Your goal weight range is:    Your goal Body Mass Index (BMI) is 19-24.  Healthy food habits can help reduce 3 risk factors for stroke:  High cholesterol, hypertension, and excess weight.  RESOURCES Stroke/Support Group:  Call [phone]   STROKE EDUCATION PROVIDED/REVIEWED AND GIVEN TO PATIENT Stroke warning signs and symptoms How to activate emergency medical system (call 911). Medications prescribed at discharge. Need for follow-up after discharge. Personal risk factors for stroke. Pneumonia vaccine given:  Flu vaccine given:  My questions have been answered, the writing is legible, and I understand these instructions.  I will adhere to these goals & educational materials that have been provided to me after my discharge from the hospital.

## 2015-05-16 NOTE — Progress Notes (Signed)
Occupational Therapy Session Note  Patient Details  Name: [name] MRN: [id number] Date of Birth: 06-30-1941  Today's Date: 05/16/2015 OT Individual Time: 1001-1100 OT Individual Time Calculation (min): 59 min    Short Term Goals: Week 1:  OT Short Term Goal 1 (Week 1): STG= LTGs secondary to estimated short LOS  Skilled Therapeutic Interventions/Progress Updates:  Upon entering the room, pt seated in recliner chair with no c/o pain this session. Pt requesting to go to bathroom upon entering the room. Pt ambulated with SPC to bathroom with close supervision. Pt required min A for toilet transfer. Pt needing assistance for hygiene and clothing management after voiding. Pt incontinent of bowel and bladder as she was attempting to sit onto toilet. Therapist obtaining clean clothing and pt dressing from toilet with min A for STS. Pt requiring min verbal cues for instruction regarding hemiplegic dressing techniques. Pt returning to sit in recliner chair secondary to fatigue with min verbal cues and demonstration for pursed lip breathing. Pt standing at sink side with close supervision to brush teeth with set up of toothpaste. Pt returning to recliner chair for rest break secondary to fatigue. Therapist educate and demonstrated self ROM exercises for L digits,wrist,elbow, and shoulder in all planes. Pt demonstrating 1 set of 10 of each exercises with min A verbal cues for proper technique. Pt seated in recliner chair with call bell and all needed items within reach.   Therapy Documentation Precautions:  Precautions Precautions: Fall Precaution Comments: L hemiparesis Restrictions Weight Bearing Restrictions: No  See FIM for current functional status  Therapy/Group: Individual Therapy  Phineas Semen 05/16/2015, 12:25 PM

## 2015-05-16 NOTE — Progress Notes (Signed)
Physical Therapy Session Note  Patient Details  Name: GWEN SARVIS MRN: [id number] Date of Birth: [birth year]  Today's Date: 05/16/2015 PT Individual Time: 2585-2778 PT Individual Time Calculation (min): 62 min   Short Term Goals: Week 1:  PT Short Term Goal 1 (Week 1): = LTGs due to shorter LOS  Skilled Therapeutic Interventions/Progress Updates:   Pt received sitting in arm chair in room, agreeable to therapy session.  Missed 15 mins of skilled PT session due to MD (cardiologist) assessing pt in room.  Once therapy session started, pt needing to use restroom prior to leaving room.  Ambulated to/from restroom with [facility] at S level and performed toileting with mod A as she needed assist for adjusting clothing prior to and following toileting but was able to perform peri care on her own.  Ambulated to sink to wash/dry hands at S level with HOH assist to utilize LUE in retrieving soap.  Ambulated to/from therapy gym with [place] at S level with min cues for upright posture and increased gait speed for decreased fall risk.  Once in therapy gym, addressed shoulder pain with new taping in V pattern around deltoids and across shoulder joint to assist with stabilization.  Pt verbalized that this was an improvement in pain.  Progressed to sit<>stand with LUE stabilized on arm rest of chair for WB while reaching for object to also increased WB and weight shift to the LLE.  Tolerated well.  Discussed floor recovery and what she would do in case of a fall.  Pt states she would crawl over to something to pull up on and get up.  Ended session with floor recover in ADL apt.  Provided verbal and demonstration cues for technique, however requires +2A to elevate back onto couch.  Provided max education on NOT attempting to perform even if having 2 people at home for safety and to call 911.  Pt verbalized understanding but seems distracted during session and needed max cues to return to serious conversation regarding safety.   Pt ambulated back to room and left in arm chair with all needs in reach.   Therapy Documentation Precautions:  Precautions Precautions: Fall Precaution Comments: L hemiparesis Restrictions Weight Bearing Restrictions: No   Vital Signs: Therapy Vitals Temp: 98.3 F (36.8 C) Temp Source: Oral Pulse Rate: 71 Resp: 18 BP: 130/72 mmHg Patient Position (if appropriate): Sitting Oxygen Therapy SpO2: 99 % O2 Device: Not Delivered Pain: Pain Assessment Pain Assessment: 0-10 Pain Score: 2  Pain Type: Acute pain Pain Location: Head Pain Descriptors / Indicators: Aching Pain Frequency: Occasional Pain Onset: Gradual Patients Stated Pain Goal: 0 Pain Intervention(s): Medication (See eMAR) Multiple Pain Sites: No   Locomotion : Ambulation Ambulation/Gait Assistance: 5: Supervision   See FIM for current functional status  Therapy/Group: Individual Therapy  Denice Bors 05/16/2015, 3:20 PM

## 2015-05-16 NOTE — Progress Notes (Signed)
Physical Therapy Session Note  Patient Details  Name: [name] MRN: [id number] Date of Birth: [birth year]  Today's Date: 05/16/2015 PT Individual Time: 0800-0900 PT Individual Time Calculation (min): 60 min   Short Term Goals: Week 1:  PT Short Term Goal 1 (Week 1): = LTGs due to shorter LOS  Skilled Therapeutic Interventions/Progress Updates:   Pt received sitting in arm chair in room, agreeable to therapy session.  Discussed LOS and goals with pt prior to leaving room.  Pt ambulated to/from therapy gym with [facility] x 110' x 2 reps and throughout session with SPC at S level.  Once in therapy gym discussed home set up and things that pt enjoyed doing at home.  Discussed that she needed to ascend/descend 15 stairs in order to get into church, however did state that she has a handicapped entrance that she could use instead if needed.  Donned GiveMohr sling to assess comfort in L shoulder during gait and balance activities.  Pt with initially stating increased comfort, however following multiple bouts of walking and stairs, states that it was uncomfortable, therefore removed and will attempt to apply new kinesiotaping for comfort this afternoon.  Performed 8, 6" steps with R handrail in alternating fashion to ascend then step to to descend at S level.  Then discussed attempting 15 stairs to better simulate community re-entry.  Pt verbalized agreement.  Performed 15, 6" steps with single handrail at S level with cues for rest breaks as needed and discussion on safest way to ascend as she tends to lead with LLE, however did not note any instability with this manner.  Ended session with dynamic balance on compliant surface with use of cane tapping to cones and side stepping as well as walking over compliant surface to better simulate outdoor surface.  Pt requires min A with light facilitation for weight shifting.  Note that she had c/o feeling "woozy" following tapping activity, therefore assisted into  sitting.  Pt unable to state why it made her feel dizzy.  Ended session with gait with head turns side/side and up/down to further challenge balance.  Tolerated well, however needed rest break and also noted that it was difficult to fully turn head.  Pt ambulated back to room and left in chair with all needs in reach.   Therapy Documentation Precautions:  Precautions Precautions: Fall Precaution Comments: L hemiparesis Restrictions Weight Bearing Restrictions: No   Vital Signs: Therapy Vitals Temp: 98.1 F (36.7 C) Temp Source: Oral Pulse Rate: (!) 55 Resp: 18 BP: (!) 150/78 mmHg Patient Position (if appropriate): Lying Pain: Pt with c/o pain in L shoulder, donned GiveMohr sling for comfort and will re-tape this afternoon.   See FIM for current functional status  Therapy/Group: Individual Therapy  Denice Bors 05/16/2015, 7:54 AM

## 2015-05-17 ENCOUNTER — Inpatient Hospital Stay (HOSPITAL_COMMUNITY): Payer: Commercial Managed Care - HMO | Admitting: Rehabilitation

## 2015-05-17 ENCOUNTER — Inpatient Hospital Stay (HOSPITAL_COMMUNITY): Payer: Commercial Managed Care - HMO | Admitting: Occupational Therapy

## 2015-05-17 DIAGNOSIS — S43002S Unspecified subluxation of left shoulder joint, sequela: Secondary | ICD-10-CM

## 2015-05-17 DIAGNOSIS — E1140 Type 2 diabetes mellitus with diabetic neuropathy, unspecified: Secondary | ICD-10-CM

## 2015-05-17 LAB — GLUCOSE, CAPILLARY
Glucose-Capillary: 99 mg/dL (ref 65–99)
Glucose-Capillary: 91 mg/dL (ref 65–99)
Glucose-Capillary: 109 mg/dL — ABNORMAL HIGH (ref 65–99)
Glucose-Capillary: 114 mg/dL — ABNORMAL HIGH (ref 65–99)

## 2015-05-17 LAB — URINE CULTURE: Colony Count: 35000

## 2015-05-17 NOTE — Progress Notes (Signed)
Physical Therapy Session Note  Patient Details  Name: [name] MRN: [id number] Date of Birth: January 17, 1941  Today's Date: 05/17/2015 PT Individual Time: 1300-1332 PT Individual Time Calculation (min): 32 min   Short Term Goals: Week 1:  PT Short Term Goal 1 (Week 1): = LTGs due to shorter LOS  Skilled Therapeutic Interventions/Progress Updates:   Skilled session focused on car transfer training with daughter present during session to practice in real car.  Prior to leaving room, pt needing to void, therefore assisted to restroom via [facility] at S level with cues for safety.  Needed assist for adjusting clothing and peri care due to urgency.  Note some urine on pants, therefore assisted with changing pants at total A level prior to leaving room.  Assisted down to daughters SUV and upon attempting car transfer, she was not able to complete due to height of car, body habitus and inability to utilize LUE.  Discussed options with family in borrowing pts grandson's car for D/C day and purchasing step stool to utilize for daughter's car.  Daughter verbalized understanding and asked about pts arm.  Educated on varying levels of recovery and recovery different from person to person.  Daughter verbalized understanding.  Also educated on follow up therapy.  Assisted pt back to room and left in w/c for next OT session.   Therapy Documentation Precautions:  Precautions Precautions: Fall Precaution Comments: L hemiparesis Restrictions Weight Bearing Restrictions: No  Pain: Pain Assessment Pain Assessment: No/denies pain   Locomotion : Ambulation Ambulation/Gait Assistance: 5: Supervision   See FIM for current functional status  Therapy/Group: Individual Therapy  Denice Bors 05/17/2015, 12:48 PM

## 2015-05-17 NOTE — Progress Notes (Signed)
Occupational Therapy Session Note  Patient Details  Name: [name] MRN: [id number] Date of Birth: 08/01/1941  Today's Date: 05/17/2015 OT Individual Time: 0915-1000 and 1330-1430 OT Individual Time Calculation (min): 45 min and 60 min   Short Term Goals: Week 1:  OT Short Term Goal 1 (Week 1): STG= LTGs secondary to estimated short LOS  Skilled Therapeutic Interventions/Progress Updates:  Session 1:Upon entering the room, pt seated in recliner chair with 5/10 c/o pain in L UE. Ice applied with pt reporting decrease in pain. OT educated and demonstrated use of LH reacher and shoe horn in order to increase I with self care. Pt returned demonstrations by donning and doffing B shoes and threading pants. Pt verbalizing she used sock aide prior to admission. Pt needing to attempt practice with sock aid before showing insight that secondary to L UE weakness she is unable to use AE for task. OT discussed circle sitting or putting foot on stool as options but pt reporting, "I will just have my family help me. I don't wear socks often." Pt standing from recliner chair and ambulating with SPC and supervision to stand at sink to brush teeth. Pt requiring set up A for task. Pt returning to recliner chair at end of session. Call bell and all needed items within reach.   Session 2: Upon entering the room, pt seated in wheelchair with no c/o pain. However, pt has L UE in arm sling for pain management. Pt requesting to shower this session. Ambulation into bathroom with supervision and use of SPC. Pt performed shower transfer with steady assist to sit onto TTB. Engaged in bathing tasks with encouraged use of LH sponge to increase independence with bathing task. Pt having to stand in shower with min A for bathing secondary to endomorphic body habitus . She also has difficulty with hand over hand encouraged use for L UE to engaged in bathing tasks for this reason. Pt seated on toilet for dressing tasks with use of  LH reacher for LB dressing. Min verbal cues required for hemiplegic dressing technique for UB. Once dressed, pt requesting to return to bed with supervision for ambulation to bed and min A for sit >supine. Call bell and all needed items within reach upon exiting the room.   Therapy Documentation Precautions:  Precautions Precautions: Fall Precaution Comments: L hemiparesis Restrictions Weight Bearing Restrictions: No  Pain: Pain Assessment Pain Assessment: No/denies pain  See FIM for current functional status  Therapy/Group: Individual Therapy  Phineas Semen 05/17/2015, 12:24 PM

## 2015-05-17 NOTE — Progress Notes (Signed)
Subjective/Complaints: Left shoulder still sore. Feels that she's got a lot of "fluid".   ROS: Pt denies fever, rash/itching, headache, blurred or double vision, nausea, vomiting, abdominal pain, diarrhea, chest pain, shortness of breath, palpitations, dysuria, dizziness, neck  pain, bleeding, anxiety, or depression  Objective: Vital Signs: Blood pressure 159/83, pulse 65, temperature 98.4 F (36.9 C), temperature source Oral, resp. rate 21, last menstrual period 03/31/2013, SpO2 96 %. No results found. Results for orders placed or performed during the hospital encounter of 05/14/15 (from the past 72 hour(s))  Glucose, capillary     Status: Abnormal   Collection Time: 05/14/15  9:11 PM  Result Value Ref Range   Glucose-Capillary 103 (H) 65 - 99 mg/dL   Comment 1 Notify RN   CBC WITH DIFFERENTIAL     Status: Abnormal   Collection Time: 05/15/15  6:01 AM  Result Value Ref Range   WBC 3.7 (L) 4.0 - 10.5 K/uL   RBC 3.27 (L) 3.87 - 5.11 MIL/uL   Hemoglobin 10.8 (L) 12.0 - 15.0 g/dL   HCT 32.1 (L) 36.0 - 46.0 %   MCV 98.2 78.0 - 100.0 fL   MCH 33.0 26.0 - 34.0 pg   MCHC 33.6 30.0 - 36.0 g/dL   RDW 13.3 11.5 - 15.5 %   Platelets 179 150 - 400 K/uL   Neutrophils Relative % 56 43 - 77 %   Neutro Abs 2.1 1.7 - 7.7 K/uL   Lymphocytes Relative 34 12 - 46 %   Lymphs Abs 1.3 0.7 - 4.0 K/uL   Monocytes Relative 9 3 - 12 %   Monocytes Absolute 0.3 0.1 - 1.0 K/uL   Eosinophils Relative 1 0 - 5 %   Eosinophils Absolute 0.1 0.0 - 0.7 K/uL   Basophils Relative 0 0 - 1 %   Basophils Absolute 0.0 0.0 - 0.1 K/uL  Comprehensive metabolic panel     Status: Abnormal   Collection Time: 05/15/15  6:01 AM  Result Value Ref Range   Sodium 138 135 - 145 mmol/L   Potassium 4.1 3.5 - 5.1 mmol/L   Chloride 108 101 - 111 mmol/L   CO2 23 22 - 32 mmol/L   Glucose, Bld 90 65 - 99 mg/dL   BUN 15 6 - 20 mg/dL   Creatinine, Ser 0.90 0.44 - 1.00 mg/dL   Calcium 8.7 (L) 8.9 - 10.3 mg/dL   Total Protein 6.0  (L) 6.5 - 8.1 g/dL   Albumin 2.8 (L) 3.5 - 5.0 g/dL   AST 17 15 - 41 U/L   ALT 13 (L) 14 - 54 U/L   Alkaline Phosphatase 38 38 - 126 U/L   Total Bilirubin 0.5 0.3 - 1.2 mg/dL   GFR calc non Af Amer >60 >60 mL/min   GFR calc Af Amer >60 >60 mL/min    Comment: (NOTE) The eGFR has been calculated using the CKD EPI equation. This calculation has not been validated in all clinical situations. eGFR's persistently <60 mL/min signify possible Chronic Kidney Disease.    Anion gap 7 5 - 15  Glucose, capillary     Status: None   Collection Time: 05/15/15  6:50 AM  Result Value Ref Range   Glucose-Capillary 88 65 - 99 mg/dL  Glucose, capillary     Status: Abnormal   Collection Time: 05/15/15 11:43 AM  Result Value Ref Range   Glucose-Capillary 101 (H) 65 - 99 mg/dL  Urinalysis, Routine w reflex microscopic (not at [facility])  Status: Abnormal   Collection Time: 05/15/15  1:30 PM  Result Value Ref Range   Color, Urine YELLOW YELLOW   APPearance CLEAR CLEAR   Specific Gravity, Urine 1.022 1.005 - 1.030   pH 5.5 5.0 - 8.0   Glucose, UA NEGATIVE NEGATIVE mg/dL   Hgb urine dipstick TRACE (A) NEGATIVE   Bilirubin Urine NEGATIVE NEGATIVE   Ketones, ur NEGATIVE NEGATIVE mg/dL   Protein, ur 30 (A) NEGATIVE mg/dL   Urobilinogen, UA 1.0 0.0 - 1.0 mg/dL   Nitrite NEGATIVE NEGATIVE   Leukocytes, UA NEGATIVE NEGATIVE  Urine culture     Status: None (Preliminary result)   Collection Time: 05/15/15  1:30 PM  Result Value Ref Range   Specimen Description URINE, CLEAN CATCH    Special Requests NONE    [place] Count      35,000 COLONIES/ML Performed at [place] Performed at [facility]    Report Status PENDING   Urine microscopic-add on     Status: Abnormal   Collection Time: 05/15/15  1:30 PM  Result Value Ref Range   Squamous Epithelial / LPF FEW (A) RARE   RBC / HPF 0-2 <3 RBC/hpf  Glucose, capillary     Status: None   Collection  Time: 05/15/15  4:36 PM  Result Value Ref Range   Glucose-Capillary 91 65 - 99 mg/dL  Glucose, capillary     Status: Abnormal   Collection Time: 05/15/15  9:01 PM  Result Value Ref Range   Glucose-Capillary 102 (H) 65 - 99 mg/dL  Glucose, capillary     Status: None   Collection Time: 05/16/15  7:05 AM  Result Value Ref Range   Glucose-Capillary 89 65 - 99 mg/dL  Glucose, capillary     Status: Abnormal   Collection Time: 05/16/15 11:20 AM  Result Value Ref Range   Glucose-Capillary 104 (H) 65 - 99 mg/dL  Glucose, capillary     Status: Abnormal   Collection Time: 05/16/15  4:25 PM  Result Value Ref Range   Glucose-Capillary 104 (H) 65 - 99 mg/dL  Glucose, capillary     Status: Abnormal   Collection Time: 05/16/15  8:30 PM  Result Value Ref Range   Glucose-Capillary 110 (H) 65 - 99 mg/dL  Glucose, capillary     Status: None   Collection Time: 05/17/15  6:34 AM  Result Value Ref Range   Glucose-Capillary 99 65 - 99 mg/dL     HEENT: normal Cardio: RRR and no murmur Resp: CTA B/L and unlabored GI: BS positive and NT, ND Extremity:  Edema perhaps trace+ in both LE's and LUE Skin:   Intact Neuro: Alert/Oriented, Cranial Nerve II-XII normal, Normal Sensory, Abnormal Motor 5/5 on right side, 2- Left wrist flexor and finger flex, tr to 1- biceps, 0/5 left FE WE, 3/5 L HF, KE ankle DF and Abnormal FMC left side Musc/Skel:   pain with Left shoulder ROM and palpation laterally near glenohumeral area. 1/2 " sublux Gen NAD, NT/[place]   Assessment/Plan: 1. 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 Physiatrist is providing close team supervision and 24 hour management of active medical problems listed below. Physiatrist and rehab team continue to assess barriers to discharge/monitor patient progress toward functional  and medical goals. FIM: FIM - Bathing Bathing  Steps Patient Completed: Chest, Abdomen, Right upper leg, Left upper leg Bathing: 2: Max-Patient completes 3-4 60f10 parts or 25-49%  FIM - Upper Body Dressing/Undressing Upper body dressing/undressing steps patient completed: Thread/unthread right sleeve of pullover shirt/dresss, Put head through opening of pull over shirt/dress Upper body dressing/undressing: 3: Mod-Patient completed 50-74% of tasks FIM - Lower Body Dressing/Undressing Lower body dressing/undressing steps patient completed: Thread/unthread left underwear leg, Thread/unthread right underwear leg, Thread/unthread right pants leg Lower body dressing/undressing: 2: Max-Patient completed 25-49% of tasks  FIM - Toileting Toileting steps completed by patient: Performs perineal hygiene Toileting Assistive Devices: Grab bar or rail for support Toileting: 2: Max-Patient completed 1 of 3 steps  FIM - TRadio producerDevices: Elevated toilet seat, Grab bars, COncologistTransfers: 5-To toilet/BSC: Supervision (verbal cues/safety issues), 5-From toilet/BSC: Supervision (verbal cues/safety issues)  FIM - BControl and instrumentation engineerDevices: Arm rests Bed/Chair Transfer: 0: Activity did not occur  FIM - Locomotion: Wheelchair Locomotion: Wheelchair: 0: Activity did not occur FIM - Locomotion: Ambulation Locomotion: Ambulation Assistive Devices: CJournalist, newspaperAmbulation/Gait Assistance: 5: Supervision Locomotion: Ambulation: 2: Travels 50 - 149 ft with supervision/safety issues  Comprehension Comprehension Mode: Auditory Comprehension: 5-Understands basic 90% of the time/requires cueing < 10% of the time  Expression Expression Mode: Verbal Expression: 5-Expresses complex 90% of the time/cues < 10% of the time  Social Interaction Social Interaction: 4-Interacts appropriately 75 - 89% of the time - Needs redirection for  appropriate language or to initiate interaction.  Problem Solving Problem Solving: 5-Solves basic problems: With no assist  Memory Memory: 6-More than reasonable amt of time  Medical Problem List and Plan: 1. 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 DVT Prophylaxis/Anticoagulation: Intravenous heparin and transitioned to Eliquis. Monitor for any bleeding episodes 3. Pain Management: Tylenol as needed  -LUE support, K-tape  -may have sling for comfort WHEN UP WITH THERAPY  -ice prn 4. Atrial fibrillation. Continue Cardizem 120 mg daily. Cardiac rate control, rate and BP still ok 5. Neuropsych: This patient is capable of making decisions on her own behalf. 6. Skin/Wound Care: Routine skin checks 7. Fluids/Electrolytes/Nutrition: encourage PO 8. Diabetes mellitus with peripheral neuropathy. Hemoglobin A1c 6.1. Currently on sliding scale insulin.  Patient onTradjenta 5 mg daily prior to admission. Sugars controlled 9. History of vaginal bleeding. Monitor on Eliquis 10. Chronic constipation. Continue Linzess 11. Hyperlipidemia. Lipitor 12 Neurogenic bladder. UA, cx negative, toviaz initiated. No PVR's recorderd! 13. "?Fluid"---check weights---se no outward signs of fluid overload  LOS (Days) 3 A FACE TO FACE EVALUATION WAS PERFORMED  SWARTZ,ZACHARY T 05/17/2015, 7:58 AM

## 2015-05-17 NOTE — Plan of Care (Signed)
Problem: RH Bathing Goal: LTG Patient will bathe with assist, cues/equipment (OT) LTG: Patient will bathe specified number of body parts with assist with/without cues using equipment (position) (OT)  Downgraded secondary to body habitus requiring pt to have assistance for balance/washing parts.  Problem: RH Dressing Goal: LTG Patient will perform lower body dressing w/assist (OT) LTG: Patient will perform lower body dressing with assist, with/without cues in positioning using equipment (OT)  Downgraded secondary to pt habitus and decreased ROM and strength in L UE for task  Problem: RH Functional Use of Upper Extremity Goal: LTG Patient will use RT/LT upper extremity as a (OT) LTG: Patient will use right/left upper extremity as a stabilizer/gross assist/diminished/nondominant/dominant level with assist, with/without cues during functional activity (OT)  Secondary to pt progress

## 2015-05-17 NOTE — Progress Notes (Signed)
Orthopedic Tech Progress Note Patient Details:  AUBERY DATE 04/03/1941 [id number]  Ortho Devices Type of Ortho Device: Arm sling Ortho Device/Splint Location: lue Ortho Device/Splint Interventions: Application   Shamira Toutant 05/17/2015, 10:16 AM

## 2015-05-17 NOTE — Progress Notes (Signed)
Physical Therapy Session Note  Patient Details  Name: [name] MRN: [id number] Date of Birth: 09/30/1941  Today's Date: 05/17/2015 PT Individual Time: 1000-1055 PT Individual Time Calculation (min): 55 min   Short Term Goals: Week 1:  PT Short Term Goal 1 (Week 1): = LTGs due to shorter LOS  Skilled Therapeutic Interventions/Progress Updates:   Pt received sitting in arm chair, agreeable to therapy session.  Assisted with donning shoes prior to leaving room and also discussed use of AE kit for bathing and dressing as well as where to purchase when she leaves hospital.  Ambulated to/from therapy gym x 100' with [place] at S to mod I level (single cue needed to scan to the L).  Session focused on car transfer as well as bed mobility to prepare for possible D/C on Tuesday.  Pt states that daughter's car is very high.  Attempted to simulate this in ortho gym, however pt unable to scoot hips safely without mod to max A, therefore had pt sit and rest (ortho tech brought pt sling to manage pain in LUE during this time) and discussed options for safer entry.  Placed step in front of car entrance, however still requires min/mod A to safely step onto step and assist for managing LLE into/out of car.  Discussed with pt that I would highly recommend that daughter come on Monday or over the weekend to practice this in real car to determine if safe or not prior to D/C.  Ambulated to to ADL apt in order to perform bed mobility.  Pt states she is getting new bed and will be about same height as apt bed and she was able to perform at S level.  Ambulated to therapy gym and performed standing marching on compliant surface to challenge ankle strategy and modified SLS, progressing to standing exercise with // bars as support with hip abd and knee flex BLE x 10 reps each.  Pt states doing these at home.  Ended session with practicing car again at car level height.  She was able to perform this at S level.  Ambulated back  to room as above.  Note daughter on her way, therefore left note to stay for 1pm session to practice car.  All needs in reach.   Therapy Documentation Precautions:  Precautions Precautions: Fall Precaution Comments: L hemiparesis Restrictions Weight Bearing Restrictions: No   Vital Signs: Therapy Vitals Temp: 98.4 F (36.9 C) Temp Source: Oral Pulse Rate: 65 Resp: (!) 21 BP: (!) 159/83 mmHg Patient Position (if appropriate): Lying Oxygen Therapy SpO2: 96 % O2 Device: Not Delivered Pain: Pt with continued pain in L shoulder, ice pack and sling donned for pain control.     See FIM for current functional status  Therapy/Group: Individual Therapy  Denice Bors 05/17/2015, 7:41 AM

## 2015-05-18 ENCOUNTER — Inpatient Hospital Stay (HOSPITAL_COMMUNITY): Payer: Commercial Managed Care - HMO | Admitting: Physical Therapy

## 2015-05-18 LAB — GLUCOSE, CAPILLARY
Glucose-Capillary: 107 mg/dL — ABNORMAL HIGH (ref 65–99)
Glucose-Capillary: 87 mg/dL (ref 65–99)
Glucose-Capillary: 93 mg/dL (ref 65–99)
Glucose-Capillary: 98 mg/dL (ref 65–99)

## 2015-05-18 NOTE — Progress Notes (Signed)
Subjective/Complaints: Left shoulder feels better with ice, sling. Still feels that she's retaining fluid  ROS: Pt denies fever, rash/itching, headache, blurred or double vision, nausea, vomiting, abdominal pain, diarrhea, chest pain, shortness of breath, palpitations, dysuria, dizziness, neck  pain, bleeding, anxiety, or depression  Objective: Vital Signs: Blood pressure 148/85, pulse 69, temperature 98.5 F (36.9 C), temperature source Oral, resp. rate 18, weight 129.865 kg (286 lb 4.8 oz), last menstrual period 03/31/2013, SpO2 97 %. No results found. Results for orders placed or performed during the hospital encounter of 05/14/15 (from the past 72 hour(s))  Glucose, capillary     Status: Abnormal   Collection Time: 05/15/15 11:43 AM  Result Value Ref Range   Glucose-Capillary 101 (H) 65 - 99 mg/dL  Urinalysis, Routine w reflex microscopic (not at [facility])     Status: Abnormal   Collection Time: 05/15/15  1:30 PM  Result Value Ref Range   Color, Urine YELLOW YELLOW   APPearance CLEAR CLEAR   Specific Gravity, Urine 1.022 1.005 - 1.030   pH 5.5 5.0 - 8.0   Glucose, UA NEGATIVE NEGATIVE mg/dL   Hgb urine dipstick TRACE (A) NEGATIVE   Bilirubin Urine NEGATIVE NEGATIVE   Ketones, ur NEGATIVE NEGATIVE mg/dL   Protein, ur 30 (A) NEGATIVE mg/dL   Urobilinogen, UA 1.0 0.0 - 1.0 mg/dL   Nitrite NEGATIVE NEGATIVE   Leukocytes, UA NEGATIVE NEGATIVE  Urine culture     Status: None   Collection Time: 05/15/15  1:30 PM  Result Value Ref Range   Specimen Description URINE, CLEAN CATCH    Special Requests NONE    Colony Count      35,000 COLONIES/ML Performed at [place] Performed at [facility]    Report Status 05/17/2015 FINAL    Organism ID, Bacteria ESCHERICHIA COLI       Susceptibility   Escherichia coli - MIC*    AMPICILLIN <=2 SENSITIVE Sensitive     CEFAZOLIN <=4 SENSITIVE Sensitive     CEFTRIAXONE <=1 SENSITIVE  Sensitive     CIPROFLOXACIN <=0.25 SENSITIVE Sensitive     GENTAMICIN <=1 SENSITIVE Sensitive     LEVOFLOXACIN <=0.12 SENSITIVE Sensitive     NITROFURANTOIN <=16 SENSITIVE Sensitive     TOBRAMYCIN <=1 SENSITIVE Sensitive     TRIMETH/SULFA <=20 SENSITIVE Sensitive     PIP/TAZO <=4 SENSITIVE Sensitive     * ESCHERICHIA COLI  Urine microscopic-add on     Status: Abnormal   Collection Time: 05/15/15  1:30 PM  Result Value Ref Range   Squamous Epithelial / LPF FEW (A) RARE   RBC / HPF 0-2 <3 RBC/hpf  Glucose, capillary     Status: None   Collection Time: 05/15/15  4:36 PM  Result Value Ref Range   Glucose-Capillary 91 65 - 99 mg/dL  Glucose, capillary     Status: Abnormal   Collection Time: 05/15/15  9:01 PM  Result Value Ref Range   Glucose-Capillary 102 (H) 65 - 99 mg/dL  Glucose, capillary     Status: None   Collection Time: 05/16/15  7:05 AM  Result Value Ref Range   Glucose-Capillary 89 65 - 99 mg/dL  Glucose, capillary     Status: Abnormal   Collection Time: 05/16/15 11:20 AM  Result Value Ref Range   Glucose-Capillary 104 (H) 65 - 99 mg/dL  Glucose, capillary     Status: Abnormal   Collection Time: 05/16/15  4:25 PM  Result Value Ref Range   Glucose-Capillary 104 (H) 65 - 99 mg/dL  Glucose, capillary     Status: Abnormal   Collection Time: 05/16/15  8:30 PM  Result Value Ref Range   Glucose-Capillary 110 (H) 65 - 99 mg/dL  Glucose, capillary     Status: None   Collection Time: 05/17/15  6:34 AM  Result Value Ref Range   Glucose-Capillary 99 65 - 99 mg/dL  Glucose, capillary     Status: None   Collection Time: 05/17/15 11:33 AM  Result Value Ref Range   Glucose-Capillary 91 65 - 99 mg/dL  Glucose, capillary     Status: Abnormal   Collection Time: 05/17/15  4:19 PM  Result Value Ref Range   Glucose-Capillary 109 (H) 65 - 99 mg/dL  Glucose, capillary     Status: Abnormal   Collection Time: 05/17/15  9:09 PM  Result Value Ref Range   Glucose-Capillary 114 (H) 65 -  99 mg/dL  Glucose, capillary     Status: Abnormal   Collection Time: 05/18/15  6:45 AM  Result Value Ref Range   Glucose-Capillary 107 (H) 65 - 99 mg/dL     HEENT: normal Cardio: RRR and no murmur Resp: CTA B/L and unlabored GI: BS positive and NT, ND Extremity:  Edema  trace+ in both LE's and LUE Skin:   Intact Neuro: Alert/Oriented, Cranial Nerve II-XII normal, Normal Sensory, Abnormal Motor 5/5 on right side, 2- Left wrist flexor and finger flex, tr to 1- biceps, 0/5 left FE WE, 3/5 L HF, KE ankle DF and Abnormal FMC left side Musc/Skel:   pain with Left shoulder ROM and palpation laterally near glenohumeral area. 1/2 " sublux Gen NAD, NT/[place]   Assessment/Plan: 1. 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 Physiatrist is providing close team supervision and 24 hour management of active medical problems listed below. Physiatrist and rehab team continue to assess barriers to discharge/monitor patient progress toward functional and medical goals. FIM: FIM - Bathing Bathing Steps Patient Completed: Chest, Left Arm, Abdomen, Front perineal area, Right upper leg, Left upper leg, Right lower leg (including foot), Left lower leg (including foot) Bathing: 4: Min-Patient completes 8-9 85f10 parts or 75+ percent  FIM - Upper Body Dressing/Undressing Upper body dressing/undressing steps patient completed: Thread/unthread right sleeve of pullover shirt/dresss, Put head through opening of pull over shirt/dress Upper body dressing/undressing: 3: Mod-Patient completed 50-74% of tasks FIM - Lower Body Dressing/Undressing Lower body dressing/undressing steps patient completed: Thread/unthread right underwear leg, Thread/unthread left underwear leg, Thread/unthread right pants leg, Thread/unthread left pants leg Lower body  dressing/undressing: 3: Mod-Patient completed 50-74% of tasks  FIM - Toileting Toileting steps completed by patient: Performs perineal hygiene Toileting Assistive Devices: Grab bar or rail for support Toileting: 2: Max-Patient completed 1 of 3 steps  FIM - TRadio producerDevices: Elevated toilet seat, Grab bars, COncologistTransfers: 5-To toilet/BSC: Supervision (verbal cues/safety issues), 5-From toilet/BSC: Supervision (verbal cues/safety issues)  FIM - BControl and instrumentation engineerDevices: Arm rests Bed/Chair Transfer: 0: Activity did not occur  FIM - Locomotion: Wheelchair Locomotion: Wheelchair: 0: Activity did not occur FIM - Locomotion: Ambulation Locomotion: Ambulation Assistive Devices: CJournalist, newspaperAmbulation/Gait Assistance: 5: Supervision Locomotion: Ambulation: 5: Travels 150 ft or more with supervision/safety issues  Comprehension Comprehension Mode: Auditory Comprehension: 5-Understands basic  90% of the time/requires cueing < 10% of the time  Expression Expression Mode: Verbal Expression: 5-Expresses basic 90% of the time/requires cueing < 10% of the time.  Social Interaction Social Interaction: 4-Interacts appropriately 75 - 89% of the time - Needs redirection for appropriate language or to initiate interaction.  Problem Solving Problem Solving: 5-Solves basic problems: With no assist  Memory Memory: 6-More than reasonable amt of time  Medical Problem List and Plan: 1. 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 DVT Prophylaxis/Anticoagulation: Intravenous heparin and transitioned to Eliquis. Monitor for any bleeding episodes 3. Pain Management: Tylenol as needed  -LUE support, K-tape  -continue sling for comfort WHEN UP WITH THERAPY  -ice prn has been helpful 4. Atrial fibrillation. Continue Cardizem 120 mg daily. Cardiac rate control, rate and BP  still ok 5. Neuropsych: This patient is capable of making decisions on her own behalf. 6. Skin/Wound Care: Routine skin checks 7. Fluids/Electrolytes/Nutrition: encourage PO, re-check labs tomorrow 8. Diabetes mellitus with peripheral neuropathy. Hemoglobin A1c 6.1. Currently on sliding scale insulin.  Patient onTradjenta 5 mg daily prior to admission. Sugars controlled 9. History of vaginal bleeding. Monitor on Eliquis 10. Chronic constipation. Continue Linzess 11. Hyperlipidemia. Lipitor 12 Neurogenic bladder. UA, cx negative, toviaz initiated. No PVR's recorderd! 13. "?Fluid retention"---checking QOD weights---se no outward signs of fluid overload  -change to low salt diet (pt agrees to try)  LOS (Days) 4 A FACE TO FACE EVALUATION WAS PERFORMED  Zanobia Griebel T 05/18/2015, 8:21 AM

## 2015-05-18 NOTE — Progress Notes (Signed)
Physical Therapy Session Note  Patient Details  Name: [name] MRN: [id number] Date of Birth: [birth year]  Today's Date: 05/18/2015 PT Individual Time: 1430-1500 PT Individual Time Calculation (min): 30 min   Short Term Goals: Week 1:  PT Short Term Goal 1 (Week 1): = LTGs due to shorter LOS  Skilled Therapeutic Interventions/Progress Updates:   Pt benefits from adjusted sling to better support LUE for decreased pain and increased stability. Pt still would probably benefit from larger sling for more support. Pt would continue to benefit from skilled PT services to increase functional mobility.  Therapy Documentation Precautions:  Precautions Precautions: Fall Precaution Comments: L hemiparesis Restrictions Weight Bearing Restrictions: No General:   Vital Signs: Therapy Vitals Temp: 98.3 F (36.8 C) Temp Source: Oral Pulse Rate: 92 Resp: 18 BP: 114/76 mmHg Patient Position (if appropriate): Sitting Oxygen Therapy SpO2: 98 % O2 Device: Not Delivered Pain: Pain Assessment Pain Assessment: 0-10 Pain Score: 4  Pain Location: Arm Pain Orientation: Left Pain Intervention(s):  (sling donned) Mobility:  Pt Min Guard for sit to supine and scooting, SBA for transfers Locomotion : Ambulation Ambulation/Gait Assistance: 5: Supervision  Other Treatments:  Pt educated on rehab plan, safety in mobility, progressing mobility, use of adaptive equipment, and insight into deficit. Sling modifed and adjusted for pt fit. Pt performs HS isometrics, hip abd isometrics, standing glute sets, LAQs 2x10. Pt performs transfers x4 in session. Static standing 1'x3. Pt performs sitting balance with dual motor tasks including weight shifting, reaching, and LE manipulation within functional task.  See FIM for current functional status  Therapy/Group: Individual Therapy  Monia Pouch 05/18/2015, 4:37 PM

## 2015-05-19 ENCOUNTER — Inpatient Hospital Stay (HOSPITAL_COMMUNITY): Payer: Commercial Managed Care - HMO

## 2015-05-19 LAB — GLUCOSE, CAPILLARY
Glucose-Capillary: 89 mg/dL (ref 65–99)
Glucose-Capillary: 84 mg/dL (ref 65–99)
Glucose-Capillary: 82 mg/dL (ref 65–99)
Glucose-Capillary: 96 mg/dL (ref 65–99)

## 2015-05-19 LAB — BASIC METABOLIC PANEL
Anion gap: 6 (ref 5–15)
BUN: 18 mg/dL (ref 6–20)
CO2: 26 mmol/L (ref 22–32)
Calcium: 8.6 mg/dL — ABNORMAL LOW (ref 8.9–10.3)
Chloride: 108 mmol/L (ref 101–111)
Creatinine, Ser: 1.05 mg/dL — ABNORMAL HIGH (ref 0.44–1.00)
GFR calc Af Amer: 60 mL/min — ABNORMAL LOW (ref >60)
GFR calc non Af Amer: 52 mL/min — ABNORMAL LOW (ref >60)
Glucose, Bld: 100 mg/dL — ABNORMAL HIGH (ref 65–99)
Potassium: 4.1 mmol/L (ref 3.5–5.1)
Sodium: 140 mmol/L (ref 135–145)

## 2015-05-19 NOTE — Progress Notes (Signed)
Occupational Therapy Session Note  Patient Details  Name: [name] MRN: [id number] Date of Birth: 1941/03/18  Today's Date: 05/19/2015 OT Individual Time: 0900-1000 OT Individual Time Calculation (min): 60 min    Short Term Goals: Week 1:  OT Short Term Goal 1 (Week 1): STG= LTGs secondary to estimated short LOS  Skilled Therapeutic Interventions/Progress Updates:    Pt engaged in BADL retraining including bathing and dressing with sit<>stand from chair at sink.  Pt declined shower this morning.  Pt requested use of toilet and amb with SPC to bathroom at supervision level.  Pt required assistance with bathing RUE and BLE.  Pt educated on hemi bathing and dressing techniques.  Pt exhibited slight shoulder elevation during UB dressing tasks.  Focus on activity tolerance, sit<>stand, standing balance, functional amb with SPC, and safety awareness.  Therapy Documentation Precautions:  Precautions Precautions: Fall Precaution Comments: L hemiparesis Restrictions Weight Bearing Restrictions: No  Pain: Pain Assessment Pain Assessment: 0-10 Pain Score: 4  Pain Type: Acute pain Pain Location: Shoulder Pain Orientation: Left Pain Descriptors / Indicators: Dull Pain Onset: On-going Pain Intervention(s): RN made aware;Repositioned  See FIM for current functional status  Therapy/Group: Individual Therapy  Leroy Libman 05/19/2015, 10:04 AM

## 2015-05-19 NOTE — Progress Notes (Signed)
CC can't use Left arm Subjective/Complaints: Left shoulder without pain, left arm weak  ROS: Pt denies fever, rash/itching, headache, blurred or double vision, nausea, vomiting, abdominal pain, diarrhea, chest pain, shortness of breath, palpitations,+ Urinary freq and incont, also PTA, neg dysuria, dizziness, neck  pain, bleeding, anxiety, or depression  Objective: Vital Signs: Blood pressure 160/76, pulse 79, temperature 98.6 F (37 C), temperature source Oral, resp. rate 18, weight 129.865 kg (286 lb 4.8 oz), last menstrual period 03/31/2013, SpO2 96 %. No results found. Results for orders placed or performed during the hospital encounter of 05/14/15 (from the past 72 hour(s))  Glucose, capillary     Status: None   Collection Time: 05/16/15  7:05 AM  Result Value Ref Range   Glucose-Capillary 89 65 - 99 mg/dL  Glucose, capillary     Status: Abnormal   Collection Time: 05/16/15 11:20 AM  Result Value Ref Range   Glucose-Capillary 104 (H) 65 - 99 mg/dL  Glucose, capillary     Status: Abnormal   Collection Time: 05/16/15  4:25 PM  Result Value Ref Range   Glucose-Capillary 104 (H) 65 - 99 mg/dL  Glucose, capillary     Status: Abnormal   Collection Time: 05/16/15  8:30 PM  Result Value Ref Range   Glucose-Capillary 110 (H) 65 - 99 mg/dL  Glucose, capillary     Status: None   Collection Time: 05/17/15  6:34 AM  Result Value Ref Range   Glucose-Capillary 99 65 - 99 mg/dL  Glucose, capillary     Status: None   Collection Time: 05/17/15 11:33 AM  Result Value Ref Range   Glucose-Capillary 91 65 - 99 mg/dL  Glucose, capillary     Status: Abnormal   Collection Time: 05/17/15  4:19 PM  Result Value Ref Range   Glucose-Capillary 109 (H) 65 - 99 mg/dL  Glucose, capillary     Status: Abnormal   Collection Time: 05/17/15  9:09 PM  Result Value Ref Range   Glucose-Capillary 114 (H) 65 - 99 mg/dL  Glucose, capillary     Status: Abnormal   Collection Time: 05/18/15  6:45 AM  Result  Value Ref Range   Glucose-Capillary 107 (H) 65 - 99 mg/dL  Glucose, capillary     Status: None   Collection Time: 05/18/15 11:18 AM  Result Value Ref Range   Glucose-Capillary 87 65 - 99 mg/dL  Glucose, capillary     Status: None   Collection Time: 05/18/15  4:26 PM  Result Value Ref Range   Glucose-Capillary 93 65 - 99 mg/dL  Glucose, capillary     Status: None   Collection Time: 05/18/15  8:57 PM  Result Value Ref Range   Glucose-Capillary 98 65 - 99 mg/dL  Basic metabolic panel     Status: Abnormal   Collection Time: 05/19/15  5:14 AM  Result Value Ref Range   Sodium 140 135 - 145 mmol/L   Potassium 4.1 3.5 - 5.1 mmol/L   Chloride 108 101 - 111 mmol/L   CO2 26 22 - 32 mmol/L   Glucose, Bld 100 (H) 65 - 99 mg/dL   BUN 18 6 - 20 mg/dL   Creatinine, Ser 1.05 (H) 0.44 - 1.00 mg/dL   Calcium 8.6 (L) 8.9 - 10.3 mg/dL   GFR calc non Af Amer 52 (L) >60 mL/min   GFR calc Af Amer 60 (L) >60 mL/min    Comment: (NOTE) The eGFR has been calculated using the CKD EPI equation. This calculation has  not been validated in all clinical situations. eGFR's persistently <60 mL/min signify possible Chronic Kidney Disease.    Anion gap 6 5 - 15     HEENT: normal Cardio: RRR and no murmur Resp: CTA B/L and unlabored GI: BS positive and NT, ND Extremity:  Edema  trace+ in both LE's and LUE Skin:   Intact Neuro: Alert/Oriented, Cranial Nerve II-XII normal, Normal Sensory, Abnormal Motor 5/5 on right side, 2- Left wrist flexor and finger flex, tr to 1- biceps,2- triceps  0/5 left FE WE, 3+/5 L HF, KE ankle DF and Abnormal FMC left side Musc/Skel:   No pain with Left shoulder ROM and palpation laterally near glenohumeral area. Gen NAD, NT/[place]   Assessment/Plan: 1. 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 Physiatrist  is providing close team supervision and 24 hour management of active medical problems listed below. Physiatrist and rehab team continue to assess barriers to discharge/monitor patient progress toward functional and medical goals. Explained that her CVA is affecting part of brain controilling arm moreso than leg and recovery will be prolonged Has some additional return of triceps since last week however FIM: FIM - Bathing Bathing Steps Patient Completed: Chest, Buttocks, Right upper leg, Right Arm, Left Arm, Left upper leg, Right lower leg (including foot), Abdomen, Front perineal area, Left lower leg (including foot) Bathing: 5: Set-up assist to: Obtain items  FIM - Upper Body Dressing/Undressing Upper body dressing/undressing steps patient completed: Thread/unthread right sleeve of pullover shirt/dresss, Put head through opening of pull over shirt/dress, Pull shirt over trunk Upper body dressing/undressing: 3: Mod-Patient completed 50-74% of tasks FIM - Lower Body Dressing/Undressing Lower body dressing/undressing steps patient completed: Thread/unthread right underwear leg, Thread/unthread left underwear leg, Thread/unthread right pants leg, Thread/unthread left pants leg Lower body dressing/undressing: 3: Mod-Patient completed 50-74% of tasks  FIM - Toileting Toileting steps completed by patient: Adjust clothing prior to toileting, Performs perineal hygiene, Adjust clothing after toileting Toileting Assistive Devices: Grab bar or rail for support Toileting: 6: More than reasonable amount of time  FIM - Radio producer Devices: Grab bars, Oncologist Transfers: 4-To toilet/BSC: Min A (steadying Pt. > 75%), 4-From toilet/BSC: Min A (steadying Pt. > 75%)  FIM - Bed/Chair Transfer Bed/Chair Transfer Assistive Devices: Arm rests Bed/Chair Transfer: 4: Sit > Supine: Min A (steadying pt. > 75%/lift 1 leg), 5: Bed > Chair or W/C: Supervision (verbal cues/safety  issues)  FIM - Locomotion: Wheelchair Locomotion: Wheelchair: 0: Activity did not occur FIM - Locomotion: Ambulation Locomotion: Ambulation Assistive Devices: Journalist, newspaper Ambulation/Gait Assistance: 5: Supervision Locomotion: Ambulation: 5: Travels 150 ft or more with supervision/safety issues  Comprehension Comprehension Mode: Auditory Comprehension: 5-Understands basic 90% of the time/requires cueing < 10% of the time  Expression Expression Mode: Verbal Expression: 5-Expresses complex 90% of the time/cues < 10% of the time  Social Interaction Social Interaction: 4-Interacts appropriately 75 - 89% of the time - Needs redirection for appropriate language or to initiate interaction.  Problem Solving Problem Solving: 5-Solves basic problems: With no assist  Memory Memory: 6-More than reasonable amt of time  Medical Problem List and Plan: 1. 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 DVT Prophylaxis/Anticoagulation: Intravenous heparin and transitioned to Eliquis. Monitor for any bleeding episodes 3. Pain Management: Tylenol as needed  -LUE support, K-tape  -  continue sling for comfort WHEN UP WITH THERAPY  -ice prn has been helpful 4. Atrial fibrillation. Continue Cardizem 120 mg daily. Cardiac rate control, rate and BP still ok 5. Neuropsych: This patient is capable of making decisions on her own behalf. 6. Skin/Wound Care: Routine skin checks 7. Fluids/Electrolytes/Nutrition: encourage PO, re-check labs tomorrow 8. Diabetes mellitus with peripheral neuropathy. Hemoglobin A1c 6.1. Currently on sliding scale insulin.  Patient onTradjenta 5 mg daily prior to admission. Sugars controlled 9. History of vaginal bleeding. Monitor on Eliquis 10. Chronic constipation. Continue Linzess 11. Hyperlipidemia. Lipitor 12 Neurogenic bladder. UA, cx negative, toviaz initiated.  13. "?Fluid retention"---checking QOD weights---se no  outward signs of fluid overload  -change to low salt diet (pt agrees to try)  LOS (Days) 5 A FACE TO FACE EVALUATION WAS PERFORMED  Arseniy Toomey E 05/19/2015, 6:55 AM

## 2015-05-19 NOTE — Progress Notes (Signed)
Occupational Therapy Session Note  Patient Details  Name: [name] MRN: [id number] Date of Birth: [birth year]  Today's Date: 05/19/2015 OT Individual Time: 1300-1400 and 1530-1600 OT Individual Time Calculation (min): 60 min and 30 min    Short Term Goals: Week 1:  OT Short Term Goal 1 (Week 1): STG= LTGs secondary to estimated short LOS  Skilled Therapeutic Interventions/Progress Updates:    Session 1: Pt seen for 1:1 OT session with a focus on functional mobility, dynamic standing balance, L NMR, UE ROM, safety awareness, and activity tolerance. Pt received sitting in chair agreeable to therapy. Completed PROM exercises in sitting. Pt ambulated apprx 200' with SPC to day room at supervision level. Pt engaged in standing balance activity with L UE weight-bearing. Pt sit<>stand 3x for 3-64mn at a time before rest break. Pt participated in towel slide ROM exercises seated in chair focusing on protraction/retraction and abduction/adduction. Pt required mod cues and HOH assistance to decrease compensation with trunk movements. Pt ambulated back to room apprx 200', requiring one rest break due to fatigue at end of session. Pt required min cues for pursed lip breathing throughout session.Therapist provided adaptation to LUE sling to provide increased support and functionality. Pt left seated in chair with all needs in reach.   Session 2: Pt seen for 1:1 OT session with a focus on L NMR, functional mobility, and activity tolerance. Pt received sitting in chair agreeable to therapy. Pt requesting to toilet and completed at a min A for clothing management d/t to urgency to toilet. Pt propelled to gym by therapist due to time constraint and pt fatigue level. Pt engaged in L NMR activity requiring pt to weight-bear in LUE while in seated position. Pt engaged in activity 3x, with rest breaks after each round. Noted increased weight-bearing achievement in sitting rather than standing position. Therapist  provided PROM during session with pt noting relief with movement. Pt ambulated apprx 823 via STennantback to room at supervision level. Pt left sitting in preferred chair with all needs in reach.   Therapy Documentation Precautions:  Precautions Precautions: Fall Precaution Comments: L hemiparesis Restrictions Weight Bearing Restrictions: No General:   Vital Signs: Therapy Vitals Temp: 97.5 F (36.4 C) Temp Source: Oral Pulse Rate: 75 Resp: 17 BP: (!) 147/70 mmHg Patient Position (if appropriate): Sitting Oxygen Therapy SpO2: 98 % O2 Device: Not Delivered Pain: Pain Assessment Pain Score: 7  Pain Type: Acute pain Pain Location: Arm Pain Orientation: Left Pain Onset: On-going Pain Intervention(s): Rest;Repositioned ADL:   Exercises:   Other Treatments:    See FIM for current functional status  Therapy/Group: Individual Therapy  EDorann Ou6/13/2016, 4:01 PM

## 2015-05-19 NOTE — Progress Notes (Signed)
Social Work Patient ID: [name], female   DOB: 12-28-1940, 74 y.o.   MRN: [id number] Team feels pt will be ready for discharge on Wed, consulted MD and he was fine with this, spoke with pt and she is unsure but will work toward. 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  Pt will be alone during the day at home, will need to be mod/i level Work toward discharge Wed.

## 2015-05-19 NOTE — Progress Notes (Signed)
Physical Therapy Session Note  Patient Details  Name: [name] MRN: [id number] Date of Birth: [birth year]  Today's Date: 05/19/2015 PT Individual Time: 1000-1100 PT Individual Time Calculation (min): 60 min   Short Term Goals: Week 1:  PT Short Term Goal 1 (Week 1): = LTGs due to shorter LOS  Skilled Therapeutic Interventions/Progress Updates:    Patient in room ambulated to sink to brush teeth and comb hair with supervision no device.  She needed assist for toothpaste opening/closing.  BP taken after standing activity 150/63.  She sat to rest in chair prior to ambulating to gym with cane x 150' with supervision without arm sling.  Patient sit to stand x 5 with right UE support.  In parallel bars performed balance activites with UE support forward tandem gait, marching and side stepping.  Patient needed frequent seated rests during activites.  Negotiated 4 stairs supervision to minguard  with railing (pt attempting to use left hand on railing and got caught during descent with assist for safety.)  Noted her left arm painful after using during session so obtained sling and fitted on patient for improved comfort during ambulation back to room with [facility] supervision and pt left in chair in room all needs in reach.  Therapy Documentation Precautions:  Precautions Precautions: Fall Precaution Comments: L hemiparesis Restrictions Weight Bearing Restrictions: No Pain: Pain Assessment Pain Assessment: 0-10 Pain Score: 7  Pain Type: Acute pain Pain Location: Arm Pain Orientation: Left Pain Descriptors / Indicators: Dull Pain Onset: On-going Pain Intervention(s): Rest;Repositioned Locomotion : Ambulation Ambulation/Gait Assistance: 5: Supervision   See FIM for current functional status  Therapy/Group: Individual Therapy  Crellin, De Queen 05/19/2015  05/19/2015, 12:47 PM

## 2015-05-20 ENCOUNTER — Inpatient Hospital Stay (HOSPITAL_COMMUNITY): Payer: Commercial Managed Care - HMO | Admitting: Rehabilitation

## 2015-05-20 ENCOUNTER — Other Ambulatory Visit (HOSPITAL_COMMUNITY): Payer: Self-pay | Admitting: Respiratory Therapy

## 2015-05-20 ENCOUNTER — Inpatient Hospital Stay (HOSPITAL_COMMUNITY): Payer: Commercial Managed Care - HMO | Admitting: Occupational Therapy

## 2015-05-20 DIAGNOSIS — I482 Chronic atrial fibrillation: Secondary | ICD-10-CM

## 2015-05-20 DIAGNOSIS — G4730 Sleep apnea, unspecified: Secondary | ICD-10-CM

## 2015-05-20 LAB — CBC
HCT: 35.4 % — ABNORMAL LOW (ref 36.0–46.0)
Hemoglobin: 11.9 g/dL — ABNORMAL LOW (ref 12.0–15.0)
MCH: 33.1 pg (ref 26.0–34.0)
MCHC: 33.6 g/dL (ref 30.0–36.0)
MCV: 98.3 fL (ref 78.0–100.0)
Platelets: 186 10*3/uL (ref 150–400)
RBC: 3.6 MIL/uL — ABNORMAL LOW (ref 3.87–5.11)
RDW: 13.4 % (ref 11.5–15.5)
WBC: 3.5 10*3/uL — ABNORMAL LOW (ref 4.0–10.5)

## 2015-05-20 LAB — GLUCOSE, CAPILLARY
Glucose-Capillary: 93 mg/dL (ref 65–99)
Glucose-Capillary: 91 mg/dL (ref 65–99)
Glucose-Capillary: 88 mg/dL (ref 65–99)

## 2015-05-20 MED ORDER — FESOTERODINE FUMARATE ER 8 MG PO TB24: 8.0000 mg | ORAL_TABLET | Freq: Every day | ORAL | Status: DC

## 2015-05-20 MED ORDER — FESOTERODINE FUMARATE ER 4 MG PO TB24
4.0000 mg | ORAL_TABLET | Freq: Every day | ORAL | Status: DC
  Administered 2015-05-20 – 2015-05-21 (×2): 4 mg via ORAL
  Filled 2015-05-20 (×3): qty 1

## 2015-05-20 NOTE — Progress Notes (Signed)
Occupational Therapy Discharge Summary  Patient Details  Name: [name] MRN: [id number] Date of Birth: 05-29-1941  Today's Date: 05/20/2015 OT Individual Time: 1334-1430 OT Individual Time Calculation (min): 56 min   Session Note:  Pt performed shower during session.  Modified independence for transfer to the walk-in shower during session.  She was able to sequence all bathing with supervision.  Utilization of reacher and LH sponge also needed secondary to decreased flexibility and inability to reach her feet.  Mod assist for integration of the LUE to wash under the right arm and for washing the left upper leg.  She continues to stand for donning shoes even though she has been instructed that sitting and using a reacher is the safer method. No clothing except shoes donned this session secondary to all of her clothing being in the washer.     Patient has met 7 of 9 long term goals due to improved balance, ability to compensate for deficits and functional use of  LEFT upper and LEFT lower extremity.  Patient to discharge at overall Supervision level.  Patient's care partner unavailable to provide the necessary cognitive assistance at discharge.    Reasons goals not met: Min assist needed for LB bathing and toileting tasks.    Recommendation:  Patient will benefit from ongoing skilled OT services in home health setting to continue to advance functional skills in the area of BADL.  She continues to need min assist for toileting tasks and LB dressing secondary to decreased use of the LUE.  Feel she will benefit from continued OT via home health to address the LUE weakness and decreased functional use.  Pt will need initial supervision at discharge which she states family will provide and will also hire help as needed.   Equipment: No equipment provided  Reasons for discharge: treatment goals met and discharge from hospital  Patient/family agrees with progress made and goals achieved:  Yes  OT Discharge Precautions/Restrictions  Precautions Precautions: Fall Precaution Comments: L hemiparesis Restrictions Weight Bearing Restrictions: No  Pain Pain Assessment Pain Assessment: No/denies pain Pain Score: 0-No pain ADL  See FIM scale for details  Vision/Perception  Vision- History Baseline Vision/History: Wears glasses Wears Glasses: At all times Patient Visual Report: No change from baseline Vision- Assessment Vision Assessment?: No apparent visual deficits  Cognition Overall Cognitive Status: Within Functional Limits for tasks assessed Arousal/Alertness: Awake/alert Orientation Level: Oriented X4 Attention: Selective Sustained Attention: Appears intact Selective Attention: Appears intact Memory: Impaired Memory Impairment: Decreased recall of new information;Decreased short term memory Awareness: Impaired Awareness Impairment: Anticipatory impairment Problem Solving: Impaired Problem Solving Impairment: Functional basic;Functional complex Safety/Judgment: Appears intact Comments: Pt continues to want to try and do things as she did before the stroke (ex standing to remove and donn her shoes) without any awareness that it is not the safest method at this time.  Sensation Sensation Light Touch: Impaired Detail Light Touch Impaired Details: Impaired LUE Stereognosis: Not tested Hot/Cold: Not tested Proprioception: Impaired Detail (impaired in the LUE) Proprioception Impaired Details: Impaired LUE Additional Comments: Pt with intact light touch sensation in LUE/LE, however is duller than R side Coordination Gross Motor Movements are Fluid and Coordinated: No Fine Motor Movements are Fluid and Coordinated: No Coordination and Movement Description: Pt currently with Brunstrum stage IV to V movement in the LUE.  Needs mod assist to incorporate as an active assist for bathing tasks.  She is able to use as a stabilizer or gross assist for holding deodorant  or items to be opened with supervision. Motor  Motor Motor: Other (comment);Abnormal postural alignment and control Motor - Discharge Observations: L hemiparesis, decreased balance Mobility  Transfers Transfers: Sit to Stand;Stand to Sit Sit to Stand: 6: Modified independent (Device/Increase time) Stand to Sit: 6: Modified independent (Device/Increase time)  Trunk/Postural Assessment  Cervical Assessment Cervical Assessment: Within Functional Limits Thoracic Assessment Thoracic Assessment: Exceptions to [facility] (slight thoracic flexion noted in standing) Lumbar Assessment Lumbar Assessment: Exceptions to [facility] (pt sits in a posterior pelvic tilt)  Balance Balance Balance Assessed: Yes Standardized Balance Assessment Standardized Balance Assessment: Berg Balance Test Berg Balance Test Sit to Stand: Able to stand  independently using hands Standing Unsupported: Able to stand safely 2 minutes Sitting with Back Unsupported but Feet Supported on Floor or Stool: Able to sit safely and securely 2 minutes Stand to Sit: Controls descent by using hands Transfers: Able to transfer safely, minor use of hands Standing Unsupported with Eyes Closed: Able to stand 10 seconds with supervision Standing Ubsupported with Feet Together: Able to place feet together independently and stand for 1 minute with supervision From Standing, Reach Forward with Outstretched Arm: Can reach forward >12 cm safely (5") From Standing Position, Pick up Object from Floor: Able to pick up shoe safely and easily From Standing Position, Turn to Look Behind Over each Shoulder: Looks behind from both sides and weight shifts well Turn 360 Degrees: Able to turn 360 degrees safely one side only in 4 seconds or less Standing Unsupported, Alternately Place Feet on Step/Stool: Able to complete 4 steps without aid or supervision Standing Unsupported, One Foot in Front: Able to plae foot ahead of the other independently and hold 30  seconds Standing on One Leg: Unable to try or needs assist to prevent fall Total Score: 43 Static Sitting Balance Static Sitting - Level of Assistance: 6: Modified independent (Device/Increase time) Dynamic Sitting Balance Dynamic Sitting - Level of Assistance: 6: Modified independent (Device/Increase time) Static Standing Balance Static Standing - Level of Assistance: 6: Modified independent (Device/Increase time) Dynamic Standing Balance Dynamic Standing - Level of Assistance: 6: Modified independent (Device/Increase time) Extremity/Trunk Assessment RUE Assessment RUE Assessment: Within Functional Limits LUE Assessment LUE Assessment: Exceptions to [facility] LUE Strength LUE Overall Strength Comments: Pt currently exhbits Brunnstrum stage IV-V in the LUE.  She demonstrates A[facility] for all joints but still with synergy pattern during functional reach or use with bathing tasks.  Gross digit extension and flexion WFLS but pt demonstrates limited thumb extension or flexion for opposition.  One finger anterior inferior subluxation also present.    See FIM for current functional status  Tecora Eustache OTR/L 05/20/2015, 3:33 PM

## 2015-05-20 NOTE — Progress Notes (Signed)
CC nocturia Subjective/Complaints: Pt states that urinary freq is about 3 times a night and this is improving.  She can "deal "  With that amt of freq and doesn't feel like meds need to be changed  ROS: No nausea, vomiting , diarrhea    No joint or muscle pain  Objective: Vital Signs: Blood pressure 159/89, pulse 87, temperature 98.9 F (37.2 C), temperature source Oral, resp. rate 17, weight 129.865 kg (286 lb 4.8 oz), last menstrual period 03/31/2013, SpO2 98 %. No results found. Results for orders placed or performed during the hospital encounter of 05/14/15 (from the past 72 hour(s))  Glucose, capillary     Status: None   Collection Time: 05/17/15 11:33 AM  Result Value Ref Range   Glucose-Capillary 91 65 - 99 mg/dL  Glucose, capillary     Status: Abnormal   Collection Time: 05/17/15  4:19 PM  Result Value Ref Range   Glucose-Capillary 109 (H) 65 - 99 mg/dL  Glucose, capillary     Status: Abnormal   Collection Time: 05/17/15  9:09 PM  Result Value Ref Range   Glucose-Capillary 114 (H) 65 - 99 mg/dL  Glucose, capillary     Status: Abnormal   Collection Time: 05/18/15  6:45 AM  Result Value Ref Range   Glucose-Capillary 107 (H) 65 - 99 mg/dL  Glucose, capillary     Status: None   Collection Time: 05/18/15 11:18 AM  Result Value Ref Range   Glucose-Capillary 87 65 - 99 mg/dL  Glucose, capillary     Status: None   Collection Time: 05/18/15  4:26 PM  Result Value Ref Range   Glucose-Capillary 93 65 - 99 mg/dL  Glucose, capillary     Status: None   Collection Time: 05/18/15  8:57 PM  Result Value Ref Range   Glucose-Capillary 98 65 - 99 mg/dL  Basic metabolic panel     Status: Abnormal   Collection Time: 05/19/15  5:14 AM  Result Value Ref Range   Sodium 140 135 - 145 mmol/L   Potassium 4.1 3.5 - 5.1 mmol/L   Chloride 108 101 - 111 mmol/L   CO2 26 22 - 32 mmol/L   Glucose, Bld 100 (H) 65 - 99 mg/dL   BUN 18 6 - 20 mg/dL   Creatinine, Ser 1.05 (H) 0.44 - 1.00 mg/dL    Calcium 8.6 (L) 8.9 - 10.3 mg/dL   GFR calc non Af Amer 52 (L) >60 mL/min   GFR calc Af Amer 60 (L) >60 mL/min    Comment: (NOTE) The eGFR has been calculated using the CKD EPI equation. This calculation has not been validated in all clinical situations. eGFR's persistently <60 mL/min signify possible Chronic Kidney Disease.    Anion gap 6 5 - 15  Glucose, capillary     Status: None   Collection Time: 05/19/15  6:37 AM  Result Value Ref Range   Glucose-Capillary 89 65 - 99 mg/dL  Glucose, capillary     Status: None   Collection Time: 05/19/15 11:17 AM  Result Value Ref Range   Glucose-Capillary 84 65 - 99 mg/dL   Comment 1 Notify RN   Glucose, capillary     Status: None   Collection Time: 05/19/15  4:17 PM  Result Value Ref Range   Glucose-Capillary 82 65 - 99 mg/dL   Comment 1 Notify RN   Glucose, capillary     Status: None   Collection Time: 05/19/15  8:26 PM  Result Value Ref Range  Glucose-Capillary 96 65 - 99 mg/dL     HEENT: normal Cardio: RRR and no murmur Resp: CTA B/L and unlabored GI: BS positive and NT, ND Extremity:  Edema  trace+ in both LE's and LUE Skin:   Intact Neuro: Alert/Oriented, Cranial Nerve II-XII normal, Normal Sensory, Abnormal Motor 5/5 on right side, 2- Left wrist flexor and finger flex, tr to 1- biceps,2- triceps  0/5 left FE WE, 3+/5 L HF, KE ankle DF and Abnormal FMC left side Musc/Skel:   No pain with Left shoulder ROM and palpation laterally near glenohumeral area. Gen NAD, NT/[place]   Assessment/Plan: 1. 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 Physiatrist is providing close team supervision and 24 hour management of active medical problems listed below. Physiatrist and rehab team continue to assess barriers to discharge/monitor patient progress toward functional  and medical goals. Should be ready for d/c recheck CBC prior to D/C FIM: FIM - Bathing Bathing Steps Patient Completed: Chest, Buttocks, Right upper leg, Left Arm, Left upper leg, Abdomen, Front perineal area Bathing: 3: Mod-Patient completes 5-7 2f10 parts or 50-74%  FIM - Upper Body Dressing/Undressing Upper body dressing/undressing steps patient completed: Thread/unthread right sleeve of pullover shirt/dresss, Put head through opening of pull over shirt/dress, Pull shirt over trunk Upper body dressing/undressing: 3: Mod-Patient completed 50-74% of tasks FIM - Lower Body Dressing/Undressing Lower body dressing/undressing steps patient completed: Thread/unthread right underwear leg, Thread/unthread left underwear leg, Thread/unthread right pants leg, Thread/unthread left pants leg, Pull underwear up/down, Pull pants up/down Lower body dressing/undressing: 3: Mod-Patient completed 50-74% of tasks  FIM - Toileting Toileting steps completed by patient: Performs perineal hygiene, Adjust clothing after toileting, Adjust clothing prior to toileting Toileting Assistive Devices: Grab bar or rail for support Toileting: 6: More than reasonable amount of time  FIM - TRadio producerDevices: Grab bars Toilet Transfers: 4-To toilet/BSC: Min A (steadying Pt. > 75%), 4-From toilet/BSC: Min A (steadying Pt. > 75%)  FIM - BControl and instrumentation engineerDevices: Arm rests, CBest boy 5: Bed > Chair or W/C: Supervision (verbal cues/safety issues), 5: Chair or W/C > Bed: Supervision (verbal cues/safety issues)  FIM - Locomotion: Wheelchair Locomotion: Wheelchair: 0: Activity did not occur FIM - Locomotion: Ambulation Locomotion: Ambulation Assistive Devices: CJournalist, newspaperAmbulation/Gait Assistance: 5: Supervision Locomotion: Ambulation: 5: Travels 150 ft or more with supervision/safety issues  Comprehension Comprehension Mode:  Auditory Comprehension: 5-Understands complex 90% of the time/Cues < 10% of the time  Expression Expression Mode: Verbal Expression: 5-Expresses basic 90% of the time/requires cueing < 10% of the time.  Social Interaction Social Interaction: 4-Interacts appropriately 75 - 89% of the time - Needs redirection for appropriate language or to initiate interaction.  Problem Solving Problem Solving: 5-Solves basic problems: With no assist  Memory Memory: 6-More than reasonable amt of time  Medical Problem List and Plan: 1. 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 DVT Prophylaxis/Anticoagulation: Intravenous heparin and transitioned to Eliquis. Monitor for any bleeding episodes 3. Pain Management: Tylenol as needed  -LUE support, K-tape  -continue sling for comfort   -ice prn has been helpful 4. Atrial fibrillation. Continue Cardizem 120 mg daily. Cardiac rate control, rate and BP still ok 5. Neuropsych: This patient is capable of making decisions on her own behalf. 6. Skin/Wound Care:  Routine skin checks 7. Fluids/Electrolytes/Nutrition: encourage PO, re-check labs tomorrow 8. Diabetes mellitus with peripheral neuropathy. Hemoglobin A1c 6.1. Currently on sliding scale insulin.  Patient onTradjenta 5 mg daily prior to admission. Sugars controlled 9. History of vaginal bleeding. Monitor on Eliquis 10. Chronic constipation. Continue Linzess 11. Hyperlipidemia. Lipitor 12 Neurogenic bladder. UA, cx negative, toviaz 10m /day   LOS (Days) 6 A FACE TO FACE EVALUATION WAS PERFORMED  KIRSTEINS,ANDREW E 05/20/2015, 6:50 AM

## 2015-05-20 NOTE — Plan of Care (Signed)
Problem: RH Car Transfers Goal: LTG Patient will perform car transfers with assist (PT) LTG: Patient will perform car transfers with assistance (PT).  Outcome: Not Met (add Reason) Requires min A to elevate and lower LLE into/out of car due to body habitus.

## 2015-05-20 NOTE — Discharge Summary (Signed)
[name], [name]              ACCOUNT NO.:  [id number]  MEDICAL RECORD NO.:  [id number]  LOCATION:  4M01C                        FACILITY:  [facility]  PHYSICIAN:  Charlett Blake, M.D.DATE OF BIRTH:  November 19, 1941  DATE OF ADMISSION:  05/14/2015 DATE OF DISCHARGE:  05/21/2015                              DISCHARGE SUMMARY   DISCHARGE DIAGNOSES: 1. Functional deficits secondary to right caudate and right frontal     lobe infarction. 2. Eliquis for deep venous thrombosis prophylaxis. 3. Pain management. 4. Atrial fibrillation. 5. Diabetes mellitus with peripheral neuropathy. 6. History of vaginal bleeding. 7. Chronic constipation. 8. Hyperlipidemia. 9. Neurogenic bladder.  HISTORY OF PRESENT ILLNESS:  This is a 74 year old right-handed female with history of atrial fibrillation, maintained on aspirin therapy; diabetes mellitus; hypertension; right ACA infarct in August 2015, had initially been placed on Eliquis, but discontinued due to vaginal bleeding.  She lives with her daughter, used a cane and a walker prior to admission.  Presented on May 10, 2015, with left-sided weakness.  MRI of the brain showed acute nonhemorrhagic infarct, right caudate, acute nonhemorrhagic infarct peri-sagittal distribution within the right frontal lobe and anterior aspect of the right parietal lobe.  Remote anterior right frontal lobe genu of the corpus callosum.  Moderate small vessel disease.  MRA of the head with high-grade stenosis of proximal right middle cerebral artery branches just beyond the bifurcation. Carotid Dopplers with no ICA stenosis.  The patient did not receive tPA. Echocardiogram with ejection fraction of 86% grade 1 diastolic dysfunction without emboli.  EEG, negative seizure.  Initially placed on intravenous heparin, transition to Eliquis monitoring for any bleeding. Tolerating a regular diet.  The patient was admitted for a comprehensive rehab program.  PAST MEDICAL  HISTORY:  See discharge diagnoses.  SOCIAL HISTORY:  Lives with family, independent with a cane walker prior to admission.  FUNCTIONAL STATUS:  Upon admission to [facility], was minimal assist stand pivot transfers; minimal assist sit to stand, minimal-to-guard for rolling, mod-to-max assist activities of daily living.  PHYSICAL EXAMINATION:  VITAL SIGNS:  Blood pressure 123/59, pulse 68, temperature 98, respirations 16. GENERAL:  This was an alert female, in no acute distress, oriented x3. LUNGS:  Clear to auscultation. CARDIAC:  Regular rate and rhythm. ABDOMEN:  Soft, nontender.  Good bowel sounds.  REHABILITATION HOSPITAL COURSE:  The patient was admitted to Inpatient Rehab Services with therapies initiated on a 3-hour daily basis consisting of physical therapy, occupational therapy, and rehabilitation nursing.  The following issues were addressed during the patient's rehabilitation stay.  Pertaining to Ms. Broberg's right caudate and right frontal lobe infarct felt to be embolic, she continued on Eliquis followed by Neurology Services.  No bleeding episodes.  Cardiac rate remained controlled with history of atrial fibrillation.  She continued on Cardizem.  Diabetes mellitus with peripheral neuropathy.  Hemoglobin A1c of 6.1.  She had been on Tradjenta prior to admission.  Blood sugar was well controlled during her hospital stay.  Chronic constipation.  No nausea or vomiting.  She continued on Linzess daily.  Lipitor ongoing for hyperlipidemia.  She did have a neurogenic bladder.  Her bladder scans were good.  She remained on Toviaz.  Urinalysis negative.  The patient received weekly collaborative interdisciplinary team conferences to discuss estimated length of stay, family teaching, and any barriers to discharge.  She ambulated in her room to the sink to brush her teeth, comb her hair, supervision, no device needed.  She sat to rest in chair prior to ambulating 150 feet,  supervision.  Negotiated four stairs, supervision to minimal guard.  She could gather her belongings for activities of daily living.  Sit-to-stand with minimal assistance.  Some rest breaks needed.  Engaged in standing balance activities.  Full family teaching was completed and plan discharge to home.  DISCHARGE MEDICATIONS: 1. Eliquis 5 mg p.o. b.i.d. 2. Lipitor 20 mg p.o. daily. 3. Cardizem CD 120 mg p.o. daily. 4. Toviaz 4 mg p.o. daily. 5. Linzess 145 mcg p.o. daily. 6.Tradjenta 5 mg daily DIET:  Diabetic diet.  FOLLOWUP:  She would follow up with Dr. Alysia Penna at the [facility] on June 25, 2015; Dr. Erlinda Hong, Neurology Services, 1 month, call for appointment; Dr. Chase Picket, [facility], medical management.     Lauraine Rinne, P.A.   ______________________________ Charlett Blake, M.D.    DA/MEDQ  D:  05/20/2015  T:  05/20/2015  Job:  [id number]  cc:   Rosalin Hawking, M.D. Tesfaye D. Legrand Rams, MD

## 2015-05-20 NOTE — Patient Care Conference (Signed)
Inpatient RehabilitationTeam Conference and Plan of Care Update Date: 05/21/2015   Time: 11;00 am    Patient Name: [name]      Medical Record Number: [id number]  Date of Birth: 01/27/1941 Sex: Female         Room/Bed: 4M01C/4M01C-01 Payor Info: Payor: [employer] MEDICARE / Plan: [place] [employer]N/NTSP / Product Type: *No Product type* /    Admitting Diagnosis: cva  Admit Date/Time:  05/14/2015  5:40 PM Admission Comments: No comment available   Primary Diagnosis:  <principal problem not specified> Principal Problem: <principal problem not specified>  Patient Active Problem List   Diagnosis Date Noted  . 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 Embolic cerebral infarction 05/14/2015  . Cerebral infarction due to unspecified mechanism   . Postmenopausal vaginal bleeding   . Paroxysmal atrial fibrillation   . Stroke   . Vaginal bleeding   . Left-sided weakness 05/10/2015  . CVA (cerebral vascular accident) 05/10/2015  . Complex endometrial hyperplasia without atypia 02/11/2015  . Simple endometrial hyperplasia without atypia 02/11/2015  . Change in bowel habits 02/07/2015  . Thickened endometrium 01/15/2015  . PMB (postmenopausal bleeding) 11/26/2014  . Urge incontinence 11/26/2014  . Cerebral embolism with cerebral infarction 07/27/2014  . Left leg weakness 07/26/2014  . Right frontal lobe lesion 07/26/2014  . Hyperlipidemia 03/13/2013  . Sleep apnea 03/13/2013  . New onset atrial fibrillation 03/13/2013  . Near syncope 03/13/2013  . Atrial fibrillation 03/12/2013  . HYPERCHOLESTEROLEMIA 05/09/2009  . SINUSITIS 05/09/2009  . BUNION, RIGHT FOOT 05/09/2009  . SHINGLES 11/07/2008  . SUPRASPINATUS TENDINITIS 08/28/2008  . DIABETES MELLITUS, CONTROLLED 08/21/2008  . SHOULDER PAIN, LEFT 08/21/2008  . SPRAIN AND STRAIN OF STERNOCLAVICULAR 07/04/2008  . OBESITY, MORBID 02/02/2008  . PROTEINURIA 02/02/2008  . DM (diabetes  mellitus), type 2 with complications 02/63/7858  . ANEMIA 12/20/2007  . Essential hypertension, benign 12/20/2007  . ASTHMA 12/20/2007  . Unspecified arthropathy, lower leg 12/20/2007    Expected Discharge Date: Expected Discharge Date: 05/21/15  Team Members Present: Physician leading conference: Dr. Alysia Penna Social Worker Present: Ovidio Kin, LCSW Nurse Present: Heather Roberts, RN PT Present: Raylene Everts, PT;Emily Rinaldo Cloud, PT OT Present: Benay Pillow, OT SLP Present: Windell Moulding, SLP PPS Coordinator present : Daiva Nakayama, RN, CRRN     Current Status/Progress Goal Weekly Team Focus  Medical   left hand swelling, morbid obesity  home d/c  d/c planning   Bowel/Bladder     cont B & B        Swallow/Nutrition/ Hydration     na        ADL's   Pt is currently supervision for UB dressing with min assist for bathing and supervision for LB bathing.  She needs min assist for LB dressing as well.  Brunnstrum stage III-IV in the left hand and arm requiring mod assist for integration of the extremity into selfcare tasks.   supervison to min assist level overall for selfcare and toileting, modified indpendent for grooming tasks  selfcare re-training, transfer training, DME/AE education, balance re-training, neuromuscular re-education   Mobility     made mod/i in room 6/14 and ready for dc-min-stairs   mod/i level   endurance/strengthening Balance,re-education  Communication     na        Safety/Cognition/ Behavioral Observations    no unsafe behaviors        Pain     pain managed  Skin     no skin issues           *See Care Plan and progress notes for long and short-term goals.  Barriers to Discharge: none    Possible Resolutions to Barriers:  home today    Discharge Planning/Teaching Needs:  Home with daughter and grandson to assist-at times will be alone.  Needs to be mod/i level for discharge      Team Discussion:  Pt reached mod/i level goals and ready  for DC today. Pt actually functioning better than prior to admission. Daughter in agreement with plan.  Revisions to Treatment Plan:  Met goals sooner and ready for DC   Continued Need for Acute Rehabilitation Level of Care: The patient requires daily medical management by a physician with specialized training in physical medicine and rehabilitation for the following conditions: Daily direction of a multidisciplinary physical rehabilitation program to ensure safe treatment while eliciting the highest outcome that is of practical value to the patient.: Yes Daily medical management of patient stability for increased activity during participation in an intensive rehabilitation regime.: Yes Daily analysis of laboratory values and/or radiology reports with any subsequent need for medication adjustment of medical intervention for : Neurological problems;Other  Elease Hashimoto 05/21/2015, 12:57 PM

## 2015-05-20 NOTE — Progress Notes (Signed)
Social Work Patient ID: [name], female   DOB: March 14, 1941, 74 y.o.   MRN: [id number] Left message for daughter to discuss discharge tomorrow and if she had any concerns. Pt made mod/i in her room today to build her confidence. Has all equipment and have arranged [facility] for follow up, she has had them before.  Await daughter's return call.

## 2015-05-20 NOTE — Discharge Summary (Signed)
Discharge summary job [id number]

## 2015-05-20 NOTE — Progress Notes (Signed)
Physical Therapy Discharge Summary  Patient Details  Name: [name] MRN: [id number] Date of Birth: 1941-04-24  Today's Date: 05/20/2015 PT Individual Time: [id number] and [phone]  PT Individual Time Calculation (min): 30 min and 60 mins   Patient has met 7 of 8 long term goals due to improved activity tolerance, improved balance, ability to compensate for deficits, functional use of  left lower extremity, improved attention and improved awareness.  Patient to discharge at an ambulatory level Modified Independent.   Patient's care partner is independent to provide the necessary cognitive (higher level as at baseline)assistance at discharge.  Reasons goals not met: Pt unable to meet car transfer goal due to needing assist for elevating and lowering LLE into/out of car.   Recommendation:  Patient will benefit from ongoing skilled PT services in home health setting to continue to advance safe functional mobility, address ongoing impairments in decreased balance, decreased functional use of LUE, decreased activity tolerance, and minimize fall risk.  Equipment: No equipment provided  Reasons for discharge: treatment goals met and discharge from hospital  Patient/family agrees with progress made and goals achieved: Yes   PT Treatment/Intervention: AM session: Pt received sitting in arm chair, agreeable to therapy session.  Pt stating needing to fix hair prior to leaving room, therefore ambulated to sink with SPC at mod I level.  Once water on, pt stating need to urinate, therefore ambulated to restroom.  Pt requires assist to lower L side of clothing however she was able to perform peri care.   Discussed with pt that she would likely still need assist for this at home.  Note that per CSW note, she would be at home some of the time by herself and therefore discussed what she was going to do when needing to toilet.  She was unable to problem solve, other than to say, "I'll get it done."   Provided education on use of reaching to assist as needed. Pt needing to change underwear due to being soiled, therefore assisted with retrieving these.  Pt attempting to don them, however she was unable to perform and was standing trying to get them on.  Educated on safety and importance of trying to perform in seated position for safety.  Pt verbalized understanding.  Remainder of session focused on assessing grad day and goals for safe D/C tomorrow.  Performed gait x 160' with SPC towards laundry area to wash clothing at mod I level.  Ambulated back to chairs by elevators to rest prior to performing stairs.  Performed 12, 6" steps with R rail at S level with min cues for safe stepping sequence.  Assessed strength, coordination, sensation, etc, see full details below.  Performed furniture transfer and bed mobility in apt to better simulate home at mod I level.  Performed car transfer at min A level due to needing assist for LLE into and out of car.  Pt ambulated back to room and left in arm chair with LUE propped on arm rest for support.  Pt made mod I in room with exception of needing min A for toileting.  RN made aware.    PM session:  Pt received sitting in arm chair in room, agreeable to therapy session.  Pt states she is in bad mood, but also states "I don't want to talk about it."  Skilled session focused on performing BERG balance test.  Note new score of 43/56 from 30/56 showing clinical change in pts balance, however still indicative of increased fall  risk.  Educated on results and the need for follow up therapy as well as use of SPC when up walking to improve balance.  Pt ambulated to/from therapy gym at mod I level with SPC.  Left in room with all needs in reach.   PT Discharge Precautions/Restrictions Precautions Precautions: Fall Precaution Comments: L hemiparesis Restrictions Weight Bearing Restrictions: No Therapy Vitals Temp: 98.5 F (36.9 C) Temp Source: Oral Pulse Rate: 73 Resp:  17 BP: (!) 154/81 mmHg Patient Position (if appropriate): Sitting Oxygen Therapy SpO2: 98 % O2 Device: Not Delivered Pain Pain Assessment Pain Assessment: No/denies pain Vision/Perception   See OT note Cognition Overall Cognitive Status: Within Functional Limits for tasks assessed Arousal/Alertness: Awake/alert Orientation Level: Oriented X4 Attention: Selective Sustained Attention: Appears intact Selective Attention: Appears intact Memory: Impaired Memory Impairment: Decreased recall of new information;Decreased short term memory (seems to be baseline) Awareness: Appears intact Safety/Judgment: Appears intact Sensation Sensation Light Touch: Appears Intact (in LE) Stereognosis: Not tested Hot/Cold: Not tested Proprioception: Appears Intact Additional Comments: Pt with intact light touch sensation in LUE/LE, however is duller than R side Coordination Gross Motor Movements are Fluid and Coordinated: No Fine Motor Movements are Fluid and Coordinated: No Coordination and Movement Description: Pt with decreased functional strength in LUE Heel Shin Test: difficult to perform due to strength deficits and body habitus Motor  Motor Motor: Other (comment);Abnormal postural alignment and control Motor - Discharge Observations: L hemiparesis, decreased balance  Mobility Bed Mobility Bed Mobility: Supine to Sit;Sit to Supine Supine to Sit: 6: Modified independent (Device/Increase time) Sit to Supine: 6: Modified independent (Device/Increase time) Transfers Transfers: Yes Sit to Stand: 6: Modified independent (Device/Increase time) Stand to Sit: 6: Modified independent (Device/Increase time) Stand Pivot Transfers: 6: Modified independent (Device/Increase time) Locomotion  Ambulation Ambulation: Yes Ambulation/Gait Assistance: 6: Modified independent (Device/Increase time) Ambulation Distance (Feet): 200 Feet Assistive device: Straight cane Gait Gait: Yes Gait Pattern:  Impaired Gait Pattern: Wide base of support;Trunk flexed;Decreased stride length Stairs / Additional Locomotion Stairs: Yes Stairs Assistance: 5: Supervision Stairs Assistance Details: Verbal cues for sequencing;Verbal cues for technique;Verbal cues for precautions/safety Stair Management Technique: One rail Right;Step to pattern;Forwards Number of Stairs: 12 Height of Stairs: 6 Wheelchair Mobility Wheelchair Mobility: No (pt ambulatory)  Trunk/Postural Assessment  Cervical Assessment Cervical Assessment: Within Functional Limits Thoracic Assessment Thoracic Assessment: Exceptions to [facility] Lumbar Assessment Lumbar Assessment: Exceptions to [facility] (posterior pelvic tilt) Postural Control Postural Control: Deficits on evaluation Protective Responses: delayed hip and stepping strategy  Balance Balance Balance Assessed: Yes Standardized Balance Assessment Standardized Balance Assessment: Berg Balance Test Berg Balance Test Sit to Stand: Able to stand  independently using hands Standing Unsupported: Able to stand safely 2 minutes Sitting with Back Unsupported but Feet Supported on Floor or Stool: Able to sit safely and securely 2 minutes Stand to Sit: Controls descent by using hands Transfers: Able to transfer safely, minor use of hands Standing Unsupported with Eyes Closed: Able to stand 10 seconds with supervision Standing Ubsupported with Feet Together: Able to place feet together independently and stand for 1 minute with supervision From Standing, Reach Forward with Outstretched Arm: Can reach forward >12 cm safely (5") From Standing Position, Pick up Object from Floor: Able to pick up shoe safely and easily From Standing Position, Turn to Look Behind Over each Shoulder: Looks behind from both sides and weight shifts well Turn 360 Degrees: Able to turn 360 degrees safely one side only in 4 seconds or less Standing Unsupported, Alternately Place  Feet on Step/Stool: Able to complete 4  steps without aid or supervision Standing Unsupported, One Foot in Front: Able to plae foot ahead of the other independently and hold 30 seconds Standing on One Leg: Unable to try or needs assist to prevent fall Total Score: 43 Static Sitting Balance Static Sitting - Level of Assistance: 6: Modified independent (Device/Increase time) Dynamic Sitting Balance Dynamic Sitting - Level of Assistance: 6: Modified independent (Device/Increase time) Static Standing Balance Static Standing - Level of Assistance: 6: Modified independent (Device/Increase time) Dynamic Standing Balance Dynamic Standing - Level of Assistance: 6: Modified independent (Device/Increase time) Extremity Assessment  RUE Assessment RUE Assessment: Within Functional Limits LUE Assessment LUE Assessment: Exceptions to [facility] LUE Strength LUE Overall Strength Comments: Pt currently exhbits Brunnstrum stage IV-V in the LUE.  She demonstrates AAROM [facility] for all joints but still with synergy pattern during functional reach or use with bathing tasks.  Gross digit extension and flexion WFLS but pt demonstrates limited thumb extension or flexion for opposition.  One finger anterior inferior subluxation also present.   RLE Assessment RLE Assessment: Exceptions to [facility] RLE Strength RLE Overall Strength Comments: grossly WFL, hip flex limited by body habitus LLE Assessment LLE Assessment: Exceptions to [facility] LLE Strength LLE Overall Strength Comments: grossly WFL, hamstrings 3+/5, hip flex limited due to weakness and body habitus  See FIM for current functional status  Denice Bors 05/20/2015, 5:25 PM

## 2015-05-20 NOTE — Plan of Care (Signed)
Problem: RH Dressing Goal: LTG Patient will perform upper body dressing (OT) LTG Patient will perform upper body dressing with assist, with/without cues (OT).  Outcome: Not Met (add Reason) Needs supervision for UB dressing.  Problem: RH Toileting Goal: LTG Patient will perform toileting w/assist, cues/equip (OT) LTG: Patient will perform toiletiing (clothes management/hygiene) with assist, with/without cues using equipment (OT)  Outcome: Not Met (add Reason) Needs min assist

## 2015-05-20 NOTE — Progress Notes (Signed)
Occupational Therapy Session Note  Patient Details  Name: [name] MRN: [id number] Date of Birth: 1941/07/31  Today's Date: 05/20/2015 OT Individual Time: 0802-0900 OT Individual Time Calculation (min): 58 min    Skilled Therapeutic Interventions/Progress Updates:    Pt performed some dressing during session as she did not want to take a shower until her afternoon session.  She utilized the reacher for threading the LLE over her left foot but did not need it for donning them over the right.  She was able to pull them up over her hips in standing but needed min assist from therapist to pull them all the way up over the left hip.  Did integrate use of the LUE as well with max assist to complete the task.  Mod instructional cueing needed for hemi dressing technique to donn a pullover shirt as well.  Therapist applied TEDs as well as issuing elastic shoe laces as pt cannot currently tie them.  She continues to prefer standing to donn shoes even though therapist has educated her that sitting down and using the reacher if needed is the safer option.    Therapy Documentation Precautions:  Precautions Precautions: Fall Precaution Comments: L hemiparesis Restrictions Weight Bearing Restrictions: No  Pain: Pain Assessment Pain Assessment: No/denies pain Pain Score: 0-No pain ADL: See FIM for current functional status  Therapy/Group: Individual Therapy  Antiono Ettinger OTR/L 05/20/2015, 2:38 PM

## 2015-05-21 ENCOUNTER — Encounter: Payer: Self-pay | Admitting: Gastroenterology

## 2015-05-21 LAB — GLUCOSE, CAPILLARY
Glucose-Capillary: 100 mg/dL — ABNORMAL HIGH (ref 65–99)
Glucose-Capillary: 92 mg/dL (ref 65–99)

## 2015-05-21 MED ORDER — ATORVASTATIN CALCIUM 20 MG PO TABS: 20.0000 mg | ORAL_TABLET | Freq: Every day | ORAL | Status: DC

## 2015-05-21 MED ORDER — VITAMIN D 1000 UNITS PO TABS: 1000.0000 [IU] | ORAL_TABLET | Freq: Every morning | ORAL | Status: DC

## 2015-05-21 MED ORDER — APIXABAN 5 MG PO TABS: 5.0000 mg | ORAL_TABLET | Freq: Two times a day (BID) | ORAL | Status: DC

## 2015-05-21 MED ORDER — DILTIAZEM HCL ER COATED BEADS 120 MG PO CP24: 120.0000 mg | ORAL_CAPSULE | Freq: Every day | ORAL | Status: DC

## 2015-05-21 NOTE — Progress Notes (Signed)
Social Work Discharge Note Discharge Note  The overall goal for the admission was met for:   Discharge location: Yes-HOME WITH DAUGHTER-EVENING ASSIST-[place] TO CHECK ON DURING THE [place]  Length of Stay: Yes-8 DAYS  Discharge activity level: Yes-MOD/I-SUPERVISION LEVEL-TUB TRANSFERS  Home/community participation: Yes  Services provided included: MD, RD, PT, OT, SLP, RN, CM, TR, Pharmacy and [place]: Private Insurance: [place]  Follow-up services arranged: Home Health: [place] and Patient/Family request agency HH: HAS HAD BEFORE, DME: NO NEEDS  Comments (or additional information):PT DID VERY WELL AND REACHED MOD/I GOALS, [place]  Patient/Family verbalized understanding of follow-up arrangements: Yes  Individual responsible for coordination of the follow-up plan: [place]-DAUGHTER & PATIENT  Confirmed correct DME delivered: Elease Hashimoto 05/21/2015    Elease Hashimoto

## 2015-05-21 NOTE — Progress Notes (Signed)
Subjective/Complaints: Ready to go home!  ROS: poor sleep Objective: Vital Signs: Blood pressure 142/62, pulse 69, temperature 98.5 F (36.9 C), temperature source Oral, resp. rate 16, weight 129.865 kg (286 lb 4.8 oz), last menstrual period 03/31/2013, SpO2 95 %. No results found. Results for orders placed or performed during the hospital encounter of 05/14/15 (from the past 72 hour(s))  Glucose, capillary     Status: None   Collection Time: 05/18/15 11:18 AM  Result Value Ref Range   Glucose-Capillary 87 65 - 99 mg/dL  Glucose, capillary     Status: None   Collection Time: 05/18/15  4:26 PM  Result Value Ref Range   Glucose-Capillary 93 65 - 99 mg/dL  Glucose, capillary     Status: None   Collection Time: 05/18/15  8:57 PM  Result Value Ref Range   Glucose-Capillary 98 65 - 99 mg/dL  Basic metabolic panel     Status: Abnormal   Collection Time: 05/19/15  5:14 AM  Result Value Ref Range   Sodium 140 135 - 145 mmol/L   Potassium 4.1 3.5 - 5.1 mmol/L   Chloride 108 101 - 111 mmol/L   CO2 26 22 - 32 mmol/L   Glucose, Bld 100 (H) 65 - 99 mg/dL   BUN 18 6 - 20 mg/dL   Creatinine, Ser 1.05 (H) 0.44 - 1.00 mg/dL   Calcium 8.6 (L) 8.9 - 10.3 mg/dL   GFR calc non Af Amer 52 (L) >60 mL/min   GFR calc Af Amer 60 (L) >60 mL/min    Comment: (NOTE) The eGFR has been calculated using the CKD EPI equation. This calculation has not been validated in all clinical situations. eGFR's persistently <60 mL/min signify possible Chronic Kidney Disease.    Anion gap 6 5 - 15  Glucose, capillary     Status: None   Collection Time: 05/19/15  6:37 AM  Result Value Ref Range   Glucose-Capillary 89 65 - 99 mg/dL  Glucose, capillary     Status: None   Collection Time: 05/19/15 11:17 AM  Result Value Ref Range   Glucose-Capillary 84 65 - 99 mg/dL   Comment 1 Notify RN   Glucose, capillary     Status: None   Collection Time: 05/19/15  4:17 PM  Result Value Ref Range   Glucose-Capillary 82 65 -  99 mg/dL   Comment 1 Notify RN   Glucose, capillary     Status: None   Collection Time: 05/19/15  8:26 PM  Result Value Ref Range   Glucose-Capillary 96 65 - 99 mg/dL  Glucose, capillary     Status: None   Collection Time: 05/20/15  6:30 AM  Result Value Ref Range   Glucose-Capillary 93 65 - 99 mg/dL  CBC     Status: Abnormal   Collection Time: 05/20/15  8:08 AM  Result Value Ref Range   WBC 3.5 (L) 4.0 - 10.5 K/uL   RBC 3.60 (L) 3.87 - 5.11 MIL/uL   Hemoglobin 11.9 (L) 12.0 - 15.0 g/dL   HCT 35.4 (L) 36.0 - 46.0 %   MCV 98.3 78.0 - 100.0 fL   MCH 33.1 26.0 - 34.0 pg   MCHC 33.6 30.0 - 36.0 g/dL   RDW 13.4 11.5 - 15.5 %   Platelets 186 150 - 400 K/uL  Glucose, capillary     Status: None   Collection Time: 05/20/15 11:17 AM  Result Value Ref Range   Glucose-Capillary 91 65 - 99 mg/dL   Comment  1 Notify RN   Glucose, capillary     Status: None   Collection Time: 05/20/15  9:06 PM  Result Value Ref Range   Glucose-Capillary 88 65 - 99 mg/dL  Glucose, capillary     Status: None   Collection Time: 05/21/15  6:54 AM  Result Value Ref Range   Glucose-Capillary 92 65 - 99 mg/dL     HEENT: normal Cardio: RRR and no murmur Resp: CTA B/L and unlabored GI: BS positive and NT, ND Extremity:  Edema  trace+ in both LE's and LUE Skin:   Intact Neuro: Alert/Oriented, Cranial Nerve II-XII normal, Normal Sensory, Abnormal Motor 5/5 on right side, 2- Left wrist flexor and finger flex,  2- biceps,2- triceps  1+/5 left FE WE, 3+/5 L HF, KE ankle DF and Abnormal FMC left side Musc/Skel:   No pain with Left shoulder ROM and palpation laterally near glenohumeral area. Gen NAD, NT/[place]   Assessment/Plan: 1. Functional deficits secondary to right caudate and right frontal lobe infarction Stable for D/C today F/u PCP in 1-2 weeks F/u PM&R 4-6 weeks See D/C summary See D/C instructions FIM: FIM - Bathing Bathing Steps Patient Completed: Chest, Left Arm, Abdomen, Front perineal area,  Buttocks, Right upper leg, Left upper leg, Right lower leg (including foot), Left lower leg (including foot) Bathing: 4: Min-Patient completes 8-9 75f10 parts or 75+ percent  FIM - Upper Body Dressing/Undressing Upper body dressing/undressing steps patient completed: Thread/unthread right sleeve of pullover shirt/dresss, Put head through opening of pull over shirt/dress, Pull shirt over trunk, Thread/unthread left sleeve of pullover shirt/dress Upper body dressing/undressing: 5: Supervision: Safety issues/verbal cues FIM - Lower Body Dressing/Undressing Lower body dressing/undressing steps patient completed: Thread/unthread right underwear leg, Thread/unthread left underwear leg, Pull underwear up/down, Thread/unthread right pants leg, Thread/unthread left pants leg, Don/Doff right shoe, Don/Doff left shoe Lower body dressing/undressing: 4: Min-Patient completed 75 plus % of tasks  FIM - Toileting Toileting steps completed by patient: Adjust clothing prior to toileting, Performs perineal hygiene Toileting Assistive Devices: Grab bar or rail for support Toileting: 3: Mod-Patient completed 2 of 3 steps  FIM - TRadio producerDevices: Elevated toilet seat, Grab bars Toilet Transfers: 6-Assistive device: No helper  FIM - BControl and instrumentation engineerDevices: Arm rests, CBest boy 6: Supine > Sit: No assist, 6: Sit > Supine: No assist, 6: Chair or W/C > Bed: No assist, 6: Bed > Chair or W/C: No assist  FIM - Locomotion: Wheelchair Distance:  (pt ambulatory) Locomotion: Wheelchair: 0: Activity did not occur FIM - Locomotion: Ambulation Locomotion: Ambulation Assistive Devices: CJournalist, newspaperAmbulation/Gait Assistance: 6: Modified independent (Device/Increase time) Locomotion: Ambulation: 6: Travels 150 ft or more independently/takes more than reasonable amount of time  Comprehension Comprehension Mode: Auditory Comprehension:  5-Understands basic 90% of the time/requires cueing < 10% of the time  Expression Expression Mode: Verbal Expression: 5-Expresses complex 90% of the time/cues < 10% of the time  Social Interaction Social Interaction: 4-Interacts appropriately 75 - 89% of the time - Needs redirection for appropriate language or to initiate interaction.  Problem Solving Problem Solving: 5-Solves basic problems: With no assist  Memory Memory: 6-More than reasonable amt of time  Medical Problem List and Plan: 1. 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 DVT Prophylaxis/Anticoagulation: Intravenous heparin and transitioned to Eliquis. Monitor for any bleeding episodes 3. Pain Management: Tylenol as needed  -LUE support, K-tape  -continue sling for  comfort   -ice prn has been helpful 4. Atrial fibrillation. Continue Cardizem 120 mg daily. Cardiac rate control, rate and BP still ok 5. Neuropsych: This patient is capable of making decisions on her own behalf. 6. Skin/Wound Care: Routine skin checks 7. Fluids/Electrolytes/Nutrition: encourage PO, re-check labs tomorrow 8. Diabetes mellitus with peripheral neuropathy. Hemoglobin A1c 6.1. Currently on sliding scale insulin.  Patient onTradjenta 5 mg daily prior to admission. Sugars controlled 9. History of vaginal bleeding. Monitor on Eliquis 10. Chronic constipation. Continue Linzess 11. Hyperlipidemia. Lipitor 12 Neurogenic bladder. UA, cx negative, toviaz 37m /day   LOS (Days) 7 A FACE TO FACE EVALUATION WAS PERFORMED  KIRSTEINS,ANDREW E 05/21/2015, 7:27 AM

## 2015-05-22 DIAGNOSIS — I8290 Acute embolism and thrombosis of unspecified vein: Secondary | ICD-10-CM | POA: Diagnosis not present

## 2015-05-22 DIAGNOSIS — I639 Cerebral infarction, unspecified: Secondary | ICD-10-CM | POA: Diagnosis not present

## 2015-05-22 DIAGNOSIS — I4891 Unspecified atrial fibrillation: Secondary | ICD-10-CM | POA: Diagnosis not present

## 2015-05-23 ENCOUNTER — Telehealth: Payer: Self-pay | Admitting: *Deleted

## 2015-05-23 DIAGNOSIS — E669 Obesity, unspecified: Secondary | ICD-10-CM | POA: Diagnosis not present

## 2015-05-23 DIAGNOSIS — J45909 Unspecified asthma, uncomplicated: Secondary | ICD-10-CM | POA: Diagnosis not present

## 2015-05-23 DIAGNOSIS — I10 Essential (primary) hypertension: Secondary | ICD-10-CM | POA: Diagnosis not present

## 2015-05-23 DIAGNOSIS — I4891 Unspecified atrial fibrillation: Secondary | ICD-10-CM | POA: Diagnosis not present

## 2015-05-23 DIAGNOSIS — E1140 Type 2 diabetes mellitus with diabetic neuropathy, unspecified: Secondary | ICD-10-CM | POA: Diagnosis not present

## 2015-05-23 DIAGNOSIS — Z7901 Long term (current) use of anticoagulants: Secondary | ICD-10-CM | POA: Diagnosis not present

## 2015-05-23 DIAGNOSIS — I69354 Hemiplegia and hemiparesis following cerebral infarction affecting left non-dominant side: Secondary | ICD-10-CM | POA: Diagnosis not present

## 2015-05-23 DIAGNOSIS — M1990 Unspecified osteoarthritis, unspecified site: Secondary | ICD-10-CM | POA: Diagnosis not present

## 2015-05-23 NOTE — Telephone Encounter (Signed)
[name] from [facility] is looking for verbal orders for PT 2X week for 1 week and then 1 X for 3 weeks.  Called and gave verbal ok for orders

## 2015-05-24 ENCOUNTER — Encounter (HOSPITAL_COMMUNITY): Payer: Self-pay

## 2015-05-24 ENCOUNTER — Emergency Department (HOSPITAL_COMMUNITY)
Admission: EM | Admit: 2015-05-24 | Discharge: 2015-05-24 | Disposition: A | Discharge status: Home / Self Care | Payer: Commercial Managed Care - HMO | Attending: Emergency Medicine | Admitting: Emergency Medicine

## 2015-05-24 ENCOUNTER — Emergency Department (HOSPITAL_COMMUNITY): Payer: Commercial Managed Care - HMO

## 2015-05-24 DIAGNOSIS — H9190 Unspecified hearing loss, unspecified ear: Secondary | ICD-10-CM | POA: Insufficient documentation

## 2015-05-24 DIAGNOSIS — E119 Type 2 diabetes mellitus without complications: Secondary | ICD-10-CM | POA: Insufficient documentation

## 2015-05-24 DIAGNOSIS — E785 Hyperlipidemia, unspecified: Secondary | ICD-10-CM | POA: Diagnosis not present

## 2015-05-24 DIAGNOSIS — I6789 Other cerebrovascular disease: Secondary | ICD-10-CM | POA: Diagnosis not present

## 2015-05-24 DIAGNOSIS — K117 Disturbances of salivary secretion: Secondary | ICD-10-CM | POA: Diagnosis present

## 2015-05-24 DIAGNOSIS — Z79899 Other long term (current) drug therapy: Secondary | ICD-10-CM | POA: Diagnosis not present

## 2015-05-24 DIAGNOSIS — J45909 Unspecified asthma, uncomplicated: Secondary | ICD-10-CM | POA: Diagnosis not present

## 2015-05-24 DIAGNOSIS — R200 Anesthesia of skin: Secondary | ICD-10-CM | POA: Diagnosis not present

## 2015-05-24 DIAGNOSIS — G8929 Other chronic pain: Secondary | ICD-10-CM | POA: Diagnosis not present

## 2015-05-24 DIAGNOSIS — E669 Obesity, unspecified: Secondary | ICD-10-CM | POA: Insufficient documentation

## 2015-05-24 DIAGNOSIS — I10 Essential (primary) hypertension: Secondary | ICD-10-CM | POA: Insufficient documentation

## 2015-05-24 DIAGNOSIS — Z8739 Personal history of other diseases of the musculoskeletal system and connective tissue: Secondary | ICD-10-CM | POA: Diagnosis not present

## 2015-05-24 DIAGNOSIS — Z79818 Long term (current) use of other agents affecting estrogen receptors and estrogen levels: Secondary | ICD-10-CM | POA: Insufficient documentation

## 2015-05-24 DIAGNOSIS — G319 Degenerative disease of nervous system, unspecified: Secondary | ICD-10-CM | POA: Diagnosis not present

## 2015-05-24 DIAGNOSIS — G459 Transient cerebral ischemic attack, unspecified: Secondary | ICD-10-CM | POA: Diagnosis not present

## 2015-05-24 DIAGNOSIS — R531 Weakness: Secondary | ICD-10-CM | POA: Diagnosis not present

## 2015-05-24 DIAGNOSIS — Z87891 Personal history of nicotine dependence: Secondary | ICD-10-CM | POA: Diagnosis not present

## 2015-05-24 DIAGNOSIS — Z862 Personal history of diseases of the blood and blood-forming organs and certain disorders involving the immune mechanism: Secondary | ICD-10-CM | POA: Insufficient documentation

## 2015-05-24 DIAGNOSIS — Z8742 Personal history of other diseases of the female genital tract: Secondary | ICD-10-CM | POA: Insufficient documentation

## 2015-05-24 DIAGNOSIS — Z7902 Long term (current) use of antithrombotics/antiplatelets: Secondary | ICD-10-CM | POA: Insufficient documentation

## 2015-05-24 LAB — CBC WITH DIFFERENTIAL/PLATELET
Basophils Absolute: 0 10*3/uL (ref 0.0–0.1)
Basophils Relative: 0 % (ref 0–1)
Eosinophils Absolute: 0.1 10*3/uL (ref 0.0–0.7)
Eosinophils Relative: 1 % (ref 0–5)
HCT: 36.2 % (ref 36.0–46.0)
Hemoglobin: 11.9 g/dL — ABNORMAL LOW (ref 12.0–15.0)
Lymphocytes Relative: 36 % (ref 12–46)
Lymphs Abs: 1.5 10*3/uL (ref 0.7–4.0)
MCH: 33.1 pg (ref 26.0–34.0)
MCHC: 32.9 g/dL (ref 30.0–36.0)
MCV: 100.6 fL — ABNORMAL HIGH (ref 78.0–100.0)
Monocytes Absolute: 0.3 10*3/uL (ref 0.1–1.0)
Monocytes Relative: 7 % (ref 3–12)
Neutro Abs: 2.3 10*3/uL (ref 1.7–7.7)
Neutrophils Relative %: 56 % (ref 43–77)
Platelets: 204 10*3/uL (ref 150–400)
RBC: 3.6 MIL/uL — ABNORMAL LOW (ref 3.87–5.11)
RDW: 13.4 % (ref 11.5–15.5)
WBC: 4.2 10*3/uL (ref 4.0–10.5)

## 2015-05-24 LAB — BASIC METABOLIC PANEL
Anion gap: 7 (ref 5–15)
BUN: 22 mg/dL — ABNORMAL HIGH (ref 6–20)
CO2: 24 mmol/L (ref 22–32)
Calcium: 8.5 mg/dL — ABNORMAL LOW (ref 8.9–10.3)
Chloride: 109 mmol/L (ref 101–111)
Creatinine, Ser: 1.05 mg/dL — ABNORMAL HIGH (ref 0.44–1.00)
GFR calc Af Amer: 60 mL/min — ABNORMAL LOW (ref >60)
GFR calc non Af Amer: 52 mL/min — ABNORMAL LOW (ref >60)
Glucose, Bld: 88 mg/dL (ref 65–99)
Potassium: 4 mmol/L (ref 3.5–5.1)
Sodium: 140 mmol/L (ref 135–145)

## 2015-05-24 LAB — PROTIME-INR
INR: 1.16 (ref 0.00–1.49)
Prothrombin Time: 15 seconds (ref 11.6–15.2)

## 2015-05-24 LAB — TROPONIN I: Troponin I: <0.03 ng/mL (ref <0.031)

## 2015-05-24 LAB — APTT: aPTT: 27 seconds (ref 24–37)

## 2015-05-24 NOTE — ED Provider Notes (Signed)
CSN: [id number]     Arrival date & time 05/24/15  1501 History   First MD Initiated Contact with Patient 05/24/15 1504     Chief Complaint  Patient presents with  . Transient Ischemic Attack     (Consider location/radiation/quality/duration/timing/severity/associated sxs/prior Treatment) HPI   [name] is a 74 y.o. female reportedly here for evaluation after an episode of drooling, while attempting to use the bathroom. She states that her left arm is in a sling, she took it off to pull down her pants, but then began to drool. Subsequent to that, she is able walk back to her bedroom, and lay down. At that point, her daughter came in and decided to have her transferred to the hospital. She was discharged from the hospital 3 days ago, after rehabilitation stay, following hospitalization for a subacute stroke on 05/14/2015. She denies headache, new weakness, nausea, vomiting, fever, chills, or back pain. She is reportedly taking her usual medicines. There are no other known modifying factors.   Past Medical History  Diagnosis Date  . Diabetes mellitus   . Asthma   . Chronic knee pain   . Back pain, chronic   . Hypertension   . Vertigo   . Hyperlipidemia   . TIA (transient ischemic attack)   . Anemia   . HOH (hard of hearing)   . Arthritis   . Obesity   . PMB (postmenopausal bleeding) 11/26/2014  . Urge incontinence 11/26/2014  . Thickened endometrium 01/15/2015  . Dysrhythmia   . Stroke     Mini Stroke   Past Surgical History  Procedure Laterality Date  . Appendectomy    . Ovary surgery    . Cataract extraction w/phaco Left 01/16/2013    Procedure: CATARACT EXTRACTION PHACO AND INTRAOCULAR LENS PLACEMENT (IOC);  Surgeon: Elta Guadeloupe T. Gershon Crane, MD;  Location: [facility];  Service: Ophthalmology;  Laterality: Left;  CDE:14.37  . Endometrial biopsy  04/03/2013       . Hysteroscopy w/d&c N/A 04/24/2013    Procedure: SUCTION DILATATION AND CURETTAGE /HYSTEROSCOPY;  Surgeon: Jonnie Kind, MD;  Location: [facility];  Service: Gynecology;  Laterality: N/A;  . Knee arthroscopy with medial menisectomy Left 07/26/2013    Procedure: KNEE ARTHROSCOPY WITH PARTIAL MEDIAL MENISECTOMY;  Surgeon: Sanjuana Kava, MD;  Location: [facility];  Service: Orthopedics;  Laterality: Left;  . Endometrial ablation    . Colonoscopy  2009    Dr. Wilford Corner: 4 hyperplastic polyps removed. Small internal hemorrhoids. Recommended 5 year follow-up colonoscopy.  . Colonoscopy N/A 02/24/2015    Procedure: COLONOSCOPY;  Surgeon: Danie Binder, MD;  Location: [facility];  Service: Endoscopy;  Laterality: N/A;  1030  . Hysteroscopy w/d&c N/A 04/02/2015    Procedure: UTERINE CURETTAGE, HYSTEROSCOPY;  Surgeon: Florian Buff, MD;  Location: [facility];  Service: Gynecology;  Laterality: N/A;   Family History  Problem Relation Age of Onset  . Arrhythmia Father     Atrial fib/Pacemaker  . Heart failure Mother   . Diabetes Mother   . Other Mother     fell and broke hip  . Diabetes Sister   . Lupus Daughter   . Mental illness Son   . ADD / ADHD Son   . Other Son     soft bones  . Diabetes Maternal Grandmother   . Stroke Paternal Grandmother   . Arthritis Paternal Grandfather   . Other Sister     MVA  . Hypertension Brother   .  Diabetes Sister   . Hypertension Sister   . Colon cancer Neg Hx    History  Substance Use Topics  . Smoking status: Former Smoker -- 0.25 packs/day for 30 years    Types: Cigarettes  . Smokeless tobacco: Never Used  . Alcohol Use: No   OB History    Gravida Para Term Preterm AB TAB SAB Ectopic Multiple Living   '2 2        2     '$ Review of Systems  All other systems reviewed and are negative.     Allergies  Imitrex; Mango flavor; and Oysters  Home Medications   Prior to Admission medications   Medication Sig Start Date End Date Taking? Authorizing Provider  acetaminophen (TYLENOL) 325 MG tablet Take 650 mg by mouth every 6 (six) hours as needed for mild  pain.    Yes Historical Provider, MD  apixaban (ELIQUIS) 5 MG TABS tablet Take 1 tablet (5 mg total) by mouth 2 (two) times daily. 05/21/15  Yes Daniel J Angiulli, PA-C  atorvastatin (LIPITOR) 20 MG tablet Take 1 tablet (20 mg total) by mouth daily at 6 PM. 05/21/15  Yes Lavon Paganini Angiulli, PA-C  cholecalciferol (VITAMIN D) 1000 UNITS tablet Take 1 tablet (1,000 Units total) by mouth every morning. 05/21/15  Yes Daniel J Angiulli, PA-C  diltiazem (CARTIA XT) 120 MG 24 hr capsule Take 1 capsule (120 mg total) by mouth daily. 05/21/15  Yes Daniel J Angiulli, PA-C  Linaclotide (LINZESS) 145 MCG CAPS capsule 1 PO 30 mins prior to your first meal Patient taking differently: Take 145 mcg by mouth daily.  05/09/15  Yes Carlis Stable, NP  megestrol (MEGACE) 40 MG tablet Take 1 tablet (40 mg total) by mouth daily. Patient taking differently: Take 80 mg by mouth daily.  05/22/15  Yes Reyne Dumas, MD  TOVIAZ 4 MG TB24 tablet Take 4 mg by mouth daily.  05/09/15  Yes Historical Provider, MD  TRADJENTA 5 MG TABS tablet Take 5 mg by mouth daily.  02/26/15  Yes Historical Provider, MD   BP 177/99 mmHg  Pulse 60  Temp(Src) 97.9 F (36.6 C) (Oral)  Resp 15  Ht 5' (1.524 m)  SpO2 98%  LMP 03/31/2013 Physical Exam  Constitutional: She is oriented to person, place, and time. She appears well-developed.  Elderly, obese  HENT:  Head: Normocephalic and atraumatic.  Right Ear: External ear normal.  Left Ear: External ear normal.  Eyes: Conjunctivae and EOM are normal. Pupils are equal, round, and reactive to light.  Neck: Normal range of motion and phonation normal. Neck supple.  Cardiovascular: Normal rate, regular rhythm and normal heart sounds.   Pulmonary/Chest: Effort normal and breath sounds normal. She exhibits no bony tenderness.  Abdominal: Soft. There is no tenderness.  Musculoskeletal: Normal range of motion.  Neurological: She is alert and oriented to person, place, and time. No cranial nerve deficit or  sensory deficit. She exhibits normal muscle tone. Coordination normal.  Flaccid left arm. Left leg strength 3 over 5. Normal strength. Right arm and right leg. No gross facial asymmetry. No dysarthria or aphasia.  Skin: Skin is warm, dry and intact. No erythema.  Psychiatric: She has a normal mood and affect. Her behavior is normal.  Nursing note and vitals reviewed.   ED Course  Procedures (including critical care time)  15:10- initial evaluation is consistent with unchanged neurologic status, following a recent CVA on 05/14/2015. She is therefore not a candidate for thrombolysis. She  will be evaluated comprehensively, and observed, in the emergency department.  Medications - No data to display  Patient Vitals for the past 24 hrs:  BP Temp Temp src Pulse Resp SpO2 Height  05/24/15 1700 177/99 mmHg - - 60 15 98 % -  05/24/15 1615 - - - (!) 57 20 100 % -  05/24/15 1600 (!) 169/104 mmHg - - (!) 58 20 99 % -  05/24/15 1530 166/98 mmHg - - - - - -  05/24/15 1519 - - - 76 18 100 % -  05/24/15 1515 - - - 62 17 99 % -  05/24/15 1511 157/92 mmHg 97.9 F (36.6 C) Oral 64 19 99 % -  05/24/15 1510 - - - - - - 5' (1.524 m)    5:45 PM Reevaluation with update and discussion. After initial assessment and treatment, an updated evaluation reveals patient's clinical status is unchanged. Her daughter is here now and gives this additional history. She saw her mother, after she returned to the bedroom, and noted that her face looked "twisted". She was not drooling at the time. At this time, now, the patient has returned to her baseline. There are no other reported problems. Will place call to neuro-hospitalist, at [facility] for help with disposition. Antonella Upson L   17:50- Paged Neurology. I discussed the case with Dr. Leonel Ramsay, at [facility] hospital. He agrees with no change in treatment plan, and discharged home at this time. 18:10  18:12- findings discussed with patient and family member, all  questions answered.  Labs Review Labs Reviewed  CBC WITH DIFFERENTIAL/PLATELET - Abnormal; Notable for the following:    RBC 3.60 (*)    Hemoglobin 11.9 (*)    MCV 100.6 (*)    All other components within normal limits  BASIC METABOLIC PANEL - Abnormal; Notable for the following:    BUN 22 (*)    Creatinine, Ser 1.05 (*)    Calcium 8.5 (*)    GFR calc non Af Amer 52 (*)    GFR calc Af Amer 60 (*)    All other components within normal limits  APTT  PROTIME-INR  TROPONIN I    Imaging Review Ct Head Wo Contrast  05/24/2015   CLINICAL DATA:  Left arm weakness today with possible mild slurred speech. Recent stroke with left arm weakness.  EXAM: CT HEAD WITHOUT CONTRAST  TECHNIQUE: Contiguous axial images were obtained from the base of the skull through the vertex without intravenous contrast.  COMPARISON:  Head CT dated 05/10/2015 and brain MR dated 05/10/2015.  FINDINGS: Diffusely enlarged ventricles and subarachnoid spaces. Patchy white matter low density in both cerebral hemispheres. Old right caudate and left anterior limb internal capsule and left corona radiata lacunar infarcts. No intracranial hemorrhage, mass lesion or CT evidence of acute infarction. Unremarkable bones and included paranasal sinuses.  IMPRESSION: 1. No acute abnormality. 2. Stable mild atrophy and extensive chronic small vessel white matter ischemic changes in both cerebral hemispheres. 3. Old lacunar infarcts, as described above.   Electronically Signed   By: Claudie Revering M.D.   On: 05/24/2015 16:00     EKG Interpretation   Date/Time:  Saturday May 24 2015 15:06:29 EDT Ventricular Rate:  63 PR Interval:  223 QRS Duration: 82 QT Interval:  420 QTC Calculation: 430 R Axis:   57 Text Interpretation:  Sinus rhythm Supraventricular bigeminy Prolonged PR  interval Abnormal R-wave progression, early transition Since last tracing  converted from  Atrial fibrillation , may be  secondary to QRS abnormality  Sinus  rhythm with prolonged PR interval Confirmed by Eulis Foster  MD, Randilyn Foisy  [phone]) on 05/24/2015 3:10:14 PM      MDM   Final diagnoses:  Transient cerebral ischemia, unspecified transient cerebral ischemia type    Evaluation is consistent with TIA, in same distribution as her recent CVA. The patient has been maximally evaluated, within a timeframe that makes it unlikely that there will be additional requirements for intervention. She is therefore discharged home to continue her current treatment plan.  Nursing Notes Reviewed/ Care Coordinated Applicable Imaging Reviewed Interpretation of Laboratory Data incorporated into ED treatment  The patient appears reasonably screened and/or stabilized for discharge and I doubt any other medical condition or other [facility] requiring further screening, evaluation, or treatment in the ED at this time prior to discharge.  Plan: Home Medications- usual; Home Treatments- rest; return here if the recommended treatment, does not improve the symptoms; Recommended follow up- PCP as scheduled     Daleen Bo, MD 05/24/15 1816

## 2015-05-24 NOTE — ED Notes (Addendum)
Pt states she was attempting to use the bathroom and noticed her left arm felt weak. Pt had a stroke a couple of weeks ago and was left with left arm weakness. Pt states she thought her speech may have been a little slurred. Pt is alert and oriented on arrival to the ED. Pt's speech is clear. She can raise her left arm a few  inches off the bed and is able to raise her right arm and both legs without difficulty

## 2015-05-24 NOTE — ED Notes (Signed)
EDP in talking with pt and family.

## 2015-05-24 NOTE — Discharge Instructions (Signed)
Transient Ischemic Attack  A transient ischemic attack (TIA) is a "warning stroke" that causes stroke-like symptoms. Unlike a stroke, a TIA does not cause permanent damage to the brain. The symptoms of a TIA can happen very fast and do not last long. It is important to know the symptoms of a TIA and what to do. This can help prevent a major stroke or death.  CAUSES   · A TIA is caused by a temporary blockage in an artery in the brain or neck (carotid artery). The blockage does not allow the brain to get the blood supply it needs and can cause different symptoms. The blockage can be caused by either:  ¨ A blood clot.  ¨ Fatty buildup (plaque) in a neck or brain artery.  RISK FACTORS  · High blood pressure (hypertension).  · High cholesterol.  · Diabetes mellitus.  · Heart disease.  · The build up of plaque in the blood vessels (peripheral artery disease or atherosclerosis).  · The build up of plaque in the blood vessels providing blood and oxygen to the brain (carotid artery stenosis).  · An abnormal heart rhythm (atrial fibrillation).  · Obesity.  · Smoking.  · Taking oral contraceptives (especially in combination with smoking).  · Physical inactivity.  · A diet high in fats, salt (sodium), and calories.  · Alcohol use.  · Use of illegal drugs (especially cocaine and methamphetamine).  · Being female.  · Being African American.  · Being over the age of 55.  · Family history of stroke.  · Previous history of blood clots, stroke, TIA, or heart attack.  · Sickle cell disease.  SYMPTOMS   TIA symptoms are the same as a stroke but are temporary. These symptoms usually develop suddenly, or may be newly present upon awakening from sleep:  · Sudden weakness or numbness of the face, arm, or leg, especially on one side of the body.  · Sudden trouble walking or difficulty moving arms or legs.  · Sudden confusion.  · Sudden personality changes.  · Trouble speaking (aphasia) or understanding.  · Difficulty swallowing.  · Sudden  trouble seeing in one or both eyes.  · Double vision.  · Dizziness.  · Loss of balance or coordination.  · Sudden severe headache with no known cause.  · Trouble reading or writing.  · Loss of bowel or bladder control.  · Loss of consciousness.  DIAGNOSIS   Your caregiver may be able to determine the presence or absence of a TIA based on your symptoms, history, and physical exam. Computed tomography (CT scan) of the brain is usually performed to help identify a TIA. Other tests may be done to diagnose a TIA. These tests may include:  · Electrocardiography.  · Continuous heart monitoring.  · Echocardiography.  · Carotid ultrasonography.  · Magnetic resonance imaging (MRI).  · A scan of the brain circulation.  · Blood tests.  PREVENTION   The risk of a TIA can be decreased by appropriately treating high blood pressure, high cholesterol, diabetes, heart disease, and obesity and by quitting smoking, limiting alcohol, and staying physically active.  TREATMENT   Time is of the essence. Since the symptoms of TIA are the same as a stroke, it is important to seek treatment as soon as possible because you may need a medicine to dissolve the clot (thrombolytic) that cannot be given if too much time has passed. Treatment options vary. Treatment options may include rest, oxygen, intravenous (IV) fluids,   and medicines to thin the blood (anticoagulants). Medicines and diet may be used to address diabetes, high blood pressure, and other risk factors. Measures will be taken to prevent short-term and long-term complications, including infection from breathing foreign material into the lungs (aspiration pneumonia), blood clots in the legs, and falls. Treatment options include procedures to either remove plaque in the carotid arteries or dilate carotid arteries that have narrowed due to plaque. Those procedures are:  · Carotid endarterectomy.  · Carotid angioplasty and stenting.  HOME CARE INSTRUCTIONS   · Take all medicines prescribed  by your caregiver. Follow the directions carefully. Medicines may be used to control risk factors for a stroke. Be sure you understand all your medicine instructions.  · You may be told to take aspirin or the anticoagulant warfarin. Warfarin needs to be taken exactly as instructed.  ¨ Taking too much or too little warfarin is dangerous. Too much warfarin increases the risk of bleeding. Too little warfarin continues to allow the risk for blood clots. While taking warfarin, you will need to have regular blood tests to measure your blood clotting time. A PT blood test measures how long it takes for blood to clot. Your PT is used to calculate another value called an INR. Your PT and INR help your caregiver to adjust your dose of warfarin. The dose can change for many reasons. It is critically important that you take warfarin exactly as prescribed.  ¨ Many foods, especially foods high in vitamin K can interfere with warfarin and affect the PT and INR. Foods high in vitamin K include spinach, kale, broccoli, cabbage, collard and turnip greens, brussels sprouts, peas, cauliflower, seaweed, and parsley as well as beef and pork liver, green tea, and soybean oil. You should eat a consistent amount of foods high in vitamin K. Avoid major changes in your diet, or notify your caregiver before changing your diet. Arrange a visit with a dietitian to answer your questions.  ¨ Many medicines can interfere with warfarin and affect the PT and INR. You must tell your caregiver about any and all medicines you take, this includes all vitamins and supplements. Be especially cautious with aspirin and anti-inflammatory medicines. Do not take or discontinue any prescribed or over-the-counter medicine except on the advice of your caregiver or pharmacist.  ¨ Warfarin can have side effects, such as excessive bruising or bleeding. You will need to hold pressure over cuts for longer than usual. Your caregiver or pharmacist will discuss other  potential side effects.  ¨ Avoid sports or activities that may cause injury or bleeding.  ¨ Be mindful when shaving, flossing your teeth, or handling sharp objects.  ¨ Alcohol can change the body's ability to handle warfarin. It is best to avoid alcoholic drinks or consume only very small amounts while taking warfarin. Notify your caregiver if you change your alcohol intake.  ¨ Notify your dentist or other caregivers before procedures.  · Eat a diet that includes 5 or more servings of fruits and vegetables each day. This may reduce the risk of stroke. Certain diets may be prescribed to address high blood pressure, high cholesterol, diabetes, or obesity.  ¨ A low-sodium, low-saturated fat, low-trans fat, low-cholesterol diet is recommended to manage high blood pressure.  ¨ A low-saturated fat, low-trans fat, low-cholesterol, and high-fiber diet may control cholesterol levels.  ¨ A controlled-carbohydrate, controlled-sugar diet is recommended to manage diabetes.  ¨ A reduced-calorie, low-sodium, low-saturated fat, low-trans fat, low-cholesterol diet is recommended to manage obesity.  ·   Maintain a healthy weight.  · Stay physically active. It is recommended that you get at least 30 minutes of activity on most or all days.  · Do not smoke.  · Limit alcohol use even if you are not taking warfarin. Moderate alcohol use is considered to be:  ¨ No more than 2 drinks each day for men.  ¨ No more than 1 drink each day for nonpregnant women.  · Stop drug abuse.  · Home safety. A safe home environment is important to reduce the risk of falls. Your caregiver may arrange for specialists to evaluate your home. Having grab bars in the bedroom and bathroom is often important. Your caregiver may arrange for equipment to be used at home, such as raised toilets and a seat for the shower.  · Follow all instructions for follow-up with your caregiver. This is very important. This includes any referrals and lab tests. Proper follow up can  prevent a stroke or another TIA from occurring.  SEEK MEDICAL CARE IF:  · You have personality changes.  · You have difficulty swallowing.  · You are seeing double.  · You have dizziness.  · You have a fever.  · You have skin breakdown.  SEEK IMMEDIATE MEDICAL CARE IF:   Any of these symptoms may represent a serious problem that is an emergency. Do not wait to see if the symptoms will go away. Get medical help right away. Call your local emergency services (911 in [place] Do not drive yourself to the hospital.  · You have sudden weakness or numbness of the face, arm, or leg, especially on one side of the body.  · You have sudden trouble walking or difficulty moving arms or legs.  · You have sudden confusion.  · You have trouble speaking (aphasia) or understanding.  · You have sudden trouble seeing in one or both eyes.  · You have a loss of balance or coordination.  · You have a sudden, severe headache with no known cause.  · You have new chest pain or an irregular heartbeat.  · You have a partial or total loss of consciousness.  MAKE SURE YOU:   · Understand these instructions.  · Will watch your condition.  · Will get help right away if you are not doing well or get worse.  Document Released: 09/01/2005 Document Revised: 11/27/2013 Document Reviewed: 02/27/2014  [employer]® Patient Information ©2015 [employer]. This information is not intended to replace advice given to you by your health care provider. Make sure you discuss any questions you have with your health care provider.

## 2015-05-27 ENCOUNTER — Emergency Department (HOSPITAL_COMMUNITY): Payer: Commercial Managed Care - HMO

## 2015-05-27 ENCOUNTER — Emergency Department (HOSPITAL_COMMUNITY)
Admission: EM | Admit: 2015-05-27 | Discharge: 2015-05-27 | Disposition: A | Discharge status: Home / Self Care | Payer: Commercial Managed Care - HMO | Attending: Emergency Medicine | Admitting: Emergency Medicine

## 2015-05-27 ENCOUNTER — Encounter (HOSPITAL_COMMUNITY): Payer: Self-pay

## 2015-05-27 DIAGNOSIS — G839 Paralytic syndrome, unspecified: Secondary | ICD-10-CM | POA: Diagnosis not present

## 2015-05-27 DIAGNOSIS — Z87891 Personal history of nicotine dependence: Secondary | ICD-10-CM | POA: Diagnosis not present

## 2015-05-27 DIAGNOSIS — J45909 Unspecified asthma, uncomplicated: Secondary | ICD-10-CM | POA: Insufficient documentation

## 2015-05-27 DIAGNOSIS — R0602 Shortness of breath: Secondary | ICD-10-CM | POA: Insufficient documentation

## 2015-05-27 DIAGNOSIS — Z7901 Long term (current) use of anticoagulants: Secondary | ICD-10-CM | POA: Insufficient documentation

## 2015-05-27 DIAGNOSIS — Z8673 Personal history of transient ischemic attack (TIA), and cerebral infarction without residual deficits: Secondary | ICD-10-CM | POA: Insufficient documentation

## 2015-05-27 DIAGNOSIS — E785 Hyperlipidemia, unspecified: Secondary | ICD-10-CM | POA: Diagnosis not present

## 2015-05-27 DIAGNOSIS — Z862 Personal history of diseases of the blood and blood-forming organs and certain disorders involving the immune mechanism: Secondary | ICD-10-CM | POA: Diagnosis not present

## 2015-05-27 DIAGNOSIS — G8929 Other chronic pain: Secondary | ICD-10-CM | POA: Insufficient documentation

## 2015-05-27 DIAGNOSIS — R2990 Unspecified symptoms and signs involving the nervous system: Secondary | ICD-10-CM

## 2015-05-27 DIAGNOSIS — H9190 Unspecified hearing loss, unspecified ear: Secondary | ICD-10-CM | POA: Insufficient documentation

## 2015-05-27 DIAGNOSIS — Z79899 Other long term (current) drug therapy: Secondary | ICD-10-CM | POA: Diagnosis not present

## 2015-05-27 DIAGNOSIS — E119 Type 2 diabetes mellitus without complications: Secondary | ICD-10-CM | POA: Diagnosis not present

## 2015-05-27 DIAGNOSIS — R531 Weakness: Secondary | ICD-10-CM | POA: Diagnosis not present

## 2015-05-27 DIAGNOSIS — R253 Fasciculation: Secondary | ICD-10-CM | POA: Diagnosis not present

## 2015-05-27 DIAGNOSIS — M1990 Unspecified osteoarthritis, unspecified site: Secondary | ICD-10-CM | POA: Diagnosis not present

## 2015-05-27 DIAGNOSIS — R404 Transient alteration of awareness: Secondary | ICD-10-CM | POA: Diagnosis not present

## 2015-05-27 DIAGNOSIS — I10 Essential (primary) hypertension: Secondary | ICD-10-CM | POA: Insufficient documentation

## 2015-05-27 DIAGNOSIS — E669 Obesity, unspecified: Secondary | ICD-10-CM | POA: Diagnosis not present

## 2015-05-27 DIAGNOSIS — R4781 Slurred speech: Secondary | ICD-10-CM | POA: Insufficient documentation

## 2015-05-27 DIAGNOSIS — I517 Cardiomegaly: Secondary | ICD-10-CM | POA: Diagnosis not present

## 2015-05-27 DIAGNOSIS — E1140 Type 2 diabetes mellitus with diabetic neuropathy, unspecified: Secondary | ICD-10-CM | POA: Diagnosis not present

## 2015-05-27 DIAGNOSIS — Z87448 Personal history of other diseases of urinary system: Secondary | ICD-10-CM | POA: Insufficient documentation

## 2015-05-27 DIAGNOSIS — I69354 Hemiplegia and hemiparesis following cerebral infarction affecting left non-dominant side: Secondary | ICD-10-CM | POA: Diagnosis not present

## 2015-05-27 DIAGNOSIS — I4891 Unspecified atrial fibrillation: Secondary | ICD-10-CM | POA: Diagnosis not present

## 2015-05-27 DIAGNOSIS — G459 Transient cerebral ischemic attack, unspecified: Secondary | ICD-10-CM

## 2015-05-27 LAB — CBC WITH DIFFERENTIAL/PLATELET
Basophils Absolute: 0 10*3/uL (ref 0.0–0.1)
Basophils Relative: 0 % (ref 0–1)
Eosinophils Absolute: 0.1 10*3/uL (ref 0.0–0.7)
Eosinophils Relative: 1 % (ref 0–5)
HCT: 37.7 % (ref 36.0–46.0)
Hemoglobin: 12.4 g/dL (ref 12.0–15.0)
Lymphocytes Relative: 28 % (ref 12–46)
Lymphs Abs: 1.2 10*3/uL (ref 0.7–4.0)
MCH: 33.3 pg (ref 26.0–34.0)
MCHC: 32.9 g/dL (ref 30.0–36.0)
MCV: 101.3 fL — ABNORMAL HIGH (ref 78.0–100.0)
Monocytes Absolute: 0.4 10*3/uL (ref 0.1–1.0)
Monocytes Relative: 8 % (ref 3–12)
Neutro Abs: 2.7 10*3/uL (ref 1.7–7.7)
Neutrophils Relative %: 63 % (ref 43–77)
Platelets: 211 10*3/uL (ref 150–400)
RBC: 3.72 MIL/uL — ABNORMAL LOW (ref 3.87–5.11)
RDW: 13.4 % (ref 11.5–15.5)
WBC: 4.3 10*3/uL (ref 4.0–10.5)

## 2015-05-27 LAB — URINALYSIS, ROUTINE W REFLEX MICROSCOPIC
Bilirubin Urine: NEGATIVE
Glucose, UA: NEGATIVE mg/dL
Ketones, ur: NEGATIVE mg/dL
Leukocytes, UA: NEGATIVE
Nitrite: NEGATIVE
Protein, ur: 30 mg/dL — AB
Specific Gravity, Urine: 1.025 (ref 1.005–1.030)
Urobilinogen, UA: 1 mg/dL (ref 0.0–1.0)
pH: 7 (ref 5.0–8.0)

## 2015-05-27 LAB — BASIC METABOLIC PANEL
Anion gap: 9 (ref 5–15)
BUN: 18 mg/dL (ref 6–20)
CO2: 25 mmol/L (ref 22–32)
Calcium: 8.9 mg/dL (ref 8.9–10.3)
Chloride: 109 mmol/L (ref 101–111)
Creatinine, Ser: 0.9 mg/dL (ref 0.44–1.00)
GFR calc Af Amer: >60 mL/min (ref >60)
GFR calc non Af Amer: >60 mL/min (ref >60)
Glucose, Bld: 111 mg/dL — ABNORMAL HIGH (ref 65–99)
Potassium: 4.2 mmol/L (ref 3.5–5.1)
Sodium: 143 mmol/L (ref 135–145)

## 2015-05-27 LAB — URINE MICROSCOPIC-ADD ON

## 2015-05-27 LAB — BRAIN NATRIURETIC PEPTIDE: B Natriuretic Peptide: 31 pg/mL (ref 0.0–100.0)

## 2015-05-27 LAB — TROPONIN I: Troponin I: <0.03 ng/mL (ref <0.031)

## 2015-05-27 MED ORDER — DIAZEPAM 2 MG PO TABS
2.0000 mg | ORAL_TABLET | Freq: Once | ORAL | Status: AC
  Administered 2015-05-27: 2 mg via ORAL
  Filled 2015-05-27: qty 1

## 2015-05-27 NOTE — ED Notes (Signed)
Patient and family verbalize understanding of discharge instructions, home care and follow up care. Patient out of department at this time with family.

## 2015-05-27 NOTE — Discharge Instructions (Signed)
Your mother was evaluated today for strokelike symptoms. Given her recent stroke, she has deficits on the left side. Her physical exam is reassuring with no new permanent deficits. Her workup is unremarkable. She is already on medication for stroke prevention.  She should follow-up with her neurologist as soon as possible. Please call and advise him of ongoing symptoms.   Transient Ischemic Attack A transient ischemic attack (TIA) is a "warning stroke" that causes stroke-like symptoms. Unlike a stroke, a TIA does not cause permanent damage to the brain. The symptoms of a TIA can happen very fast and do not last long. It is important to know the symptoms of a TIA and what to do. This can help prevent a major stroke or death. CAUSES   A TIA is caused by a temporary blockage in an artery in the brain or neck (carotid artery). The blockage does not allow the brain to get the blood supply it needs and can cause different symptoms. The blockage can be caused by either:  A blood clot.  Fatty buildup (plaque) in a neck or brain artery. RISK FACTORS  High blood pressure (hypertension).  High cholesterol.  Diabetes mellitus.  Heart disease.  The build up of plaque in the blood vessels (peripheral artery disease or atherosclerosis).  The build up of plaque in the blood vessels providing blood and oxygen to the brain (carotid artery stenosis).  An abnormal heart rhythm (atrial fibrillation).  Obesity.  Smoking.  Taking oral contraceptives (especially in combination with smoking).  Physical inactivity.  A diet high in fats, salt (sodium), and calories.  Alcohol use.  Use of illegal drugs (especially cocaine and methamphetamine).  Being female.  Being African American.  Being over the age of 41.  Family history of stroke.  Previous history of blood clots, stroke, TIA, or heart attack.  Sickle cell disease. SYMPTOMS  TIA symptoms are the same as a stroke but are temporary.  These symptoms usually develop suddenly, or may be newly present upon awakening from sleep:  Sudden weakness or numbness of the face, arm, or leg, especially on one side of the body.  Sudden trouble walking or difficulty moving arms or legs.  Sudden confusion.  Sudden personality changes.  Trouble speaking (aphasia) or understanding.  Difficulty swallowing.  Sudden trouble seeing in one or both eyes.  Double vision.  Dizziness.  Loss of balance or coordination.  Sudden severe headache with no known cause.  Trouble reading or writing.  Loss of bowel or bladder control.  Loss of consciousness. DIAGNOSIS  Your caregiver may be able to determine the presence or absence of a TIA based on your symptoms, history, and physical exam. Computed tomography (CT scan) of the brain is usually performed to help identify a TIA. Other tests may be done to diagnose a TIA. These tests may include:  Electrocardiography.  Continuous heart monitoring.  Echocardiography.  Carotid ultrasonography.  Magnetic resonance imaging (MRI).  A scan of the brain circulation.  Blood tests. PREVENTION  The risk of a TIA can be decreased by appropriately treating high blood pressure, high cholesterol, diabetes, heart disease, and obesity and by quitting smoking, limiting alcohol, and staying physically active. TREATMENT  Time is of the essence. Since the symptoms of TIA are the same as a stroke, it is important to seek treatment as soon as possible because you may need a medicine to dissolve the clot (thrombolytic) that cannot be given if too much time has passed. Treatment options vary. Treatment options  may include rest, oxygen, intravenous (IV) fluids, and medicines to thin the blood (anticoagulants). Medicines and diet may be used to address diabetes, high blood pressure, and other risk factors. Measures will be taken to prevent short-term and long-term complications, including infection from breathing  foreign material into the lungs (aspiration pneumonia), blood clots in the legs, and falls. Treatment options include procedures to either remove plaque in the carotid arteries or dilate carotid arteries that have narrowed due to plaque. Those procedures are:  Carotid endarterectomy.  Carotid angioplasty and stenting. HOME CARE INSTRUCTIONS   Take all medicines prescribed by your caregiver. Follow the directions carefully. Medicines may be used to control risk factors for a stroke. Be sure you understand all your medicine instructions.  You may be told to take aspirin or the anticoagulant warfarin. Warfarin needs to be taken exactly as instructed.  Taking too much or too little warfarin is dangerous. Too much warfarin increases the risk of bleeding. Too little warfarin continues to allow the risk for blood clots. While taking warfarin, you will need to have regular blood tests to measure your blood clotting time. A PT blood test measures how long it takes for blood to clot. Your PT is used to calculate another value called an INR. Your PT and INR help your caregiver to adjust your dose of warfarin. The dose can change for many reasons. It is critically important that you take warfarin exactly as prescribed.  Many foods, especially foods high in vitamin K can interfere with warfarin and affect the PT and INR. Foods high in vitamin K include spinach, kale, broccoli, cabbage, collard and turnip greens, brussels sprouts, peas, cauliflower, seaweed, and parsley as well as beef and pork liver, green tea, and soybean oil. You should eat a consistent amount of foods high in vitamin K. Avoid major changes in your diet, or notify your caregiver before changing your diet. Arrange a visit with a dietitian to answer your questions.  Many medicines can interfere with warfarin and affect the PT and INR. You must tell your caregiver about any and all medicines you take, this includes all vitamins and supplements. Be  especially cautious with aspirin and anti-inflammatory medicines. Do not take or discontinue any prescribed or over-the-counter medicine except on the advice of your caregiver or pharmacist.  Warfarin can have side effects, such as excessive bruising or bleeding. You will need to hold pressure over cuts for longer than usual. Your caregiver or pharmacist will discuss other potential side effects.  Avoid sports or activities that may cause injury or bleeding.  Be mindful when shaving, flossing your teeth, or handling sharp objects.  Alcohol can change the body's ability to handle warfarin. It is best to avoid alcoholic drinks or consume only very small amounts while taking warfarin. Notify your caregiver if you change your alcohol intake.  Notify your dentist or other caregivers before procedures.  Eat a diet that includes 5 or more servings of fruits and vegetables each day. This may reduce the risk of stroke. Certain diets may be prescribed to address high blood pressure, high cholesterol, diabetes, or obesity.  A low-sodium, low-saturated fat, low-trans fat, low-cholesterol diet is recommended to manage high blood pressure.  A low-saturated fat, low-trans fat, low-cholesterol, and high-fiber diet may control cholesterol levels.  A controlled-carbohydrate, controlled-sugar diet is recommended to manage diabetes.  A reduced-calorie, low-sodium, low-saturated fat, low-trans fat, low-cholesterol diet is recommended to manage obesity.  Maintain a healthy weight.  Stay physically active. It is  recommended that you get at least 30 minutes of activity on most or all days.  Do not smoke.  Limit alcohol use even if you are not taking warfarin. Moderate alcohol use is considered to be:  No more than 2 drinks each day for men.  No more than 1 drink each day for nonpregnant women.  Stop drug abuse.  Home safety. A safe home environment is important to reduce the risk of falls. Your caregiver  may arrange for specialists to evaluate your home. Having grab bars in the bedroom and bathroom is often important. Your caregiver may arrange for equipment to be used at home, such as raised toilets and a seat for the shower.  Follow all instructions for follow-up with your caregiver. This is very important. This includes any referrals and lab tests. Proper follow up can prevent a stroke or another TIA from occurring. SEEK MEDICAL CARE IF:  You have personality changes.  You have difficulty swallowing.  You are seeing double.  You have dizziness.  You have a fever.  You have skin breakdown. SEEK IMMEDIATE MEDICAL CARE IF:  Any of these symptoms may represent a serious problem that is an emergency. Do not wait to see if the symptoms will go away. Get medical help right away. Call your local emergency services (911 in [place] Do not drive yourself to the hospital.  You have sudden weakness or numbness of the face, arm, or leg, especially on one side of the body.  You have sudden trouble walking or difficulty moving arms or legs.  You have sudden confusion.  You have trouble speaking (aphasia) or understanding.  You have sudden trouble seeing in one or both eyes.  You have a loss of balance or coordination.  You have a sudden, severe headache with no known cause.  You have new chest pain or an irregular heartbeat.  You have a partial or total loss of consciousness. MAKE SURE YOU:   Understand these instructions.  Will watch your condition.  Will get help right away if you are not doing well or get worse. Document Released: 09/01/2005 Document Revised: 11/27/2013 Document Reviewed: 02/27/2014 [facility] Patient Information 2015 [place], [place]. This information is not intended to replace advice given to you by your health care provider. Make sure you discuss any questions you have with your health care provider.

## 2015-05-27 NOTE — ED Notes (Signed)
Patient via [facility] c/o multiple TIA's that started 3 days ago. Per family patient has been having multiple TIA's. Tonight patient called out for a family member and has slurred speech. Patient has left sided deficit from stroke 05/09/15. Patient is A&OX4, clear speech, patient denies anything different from baseline.

## 2015-05-27 NOTE — ED Notes (Signed)
Family at bedside with EDP. Family member states patient has had multiple events of drooling, difficulty articulation, and focusing her attention that last multiple minutes

## 2015-05-27 NOTE — ED Notes (Signed)
Patient ambulatory with assistance in room and hallway. Ambulatory 30 feet

## 2015-05-27 NOTE — ED Provider Notes (Signed)
CSN: [id number]     Arrival date & time 05/27/15  0143 History   First MD Initiated Contact with Patient 05/27/15 0157     Chief Complaint  Patient presents with  . Transient Ischemic Attack     (Consider location/radiation/quality/duration/timing/severity/associated sxs/prior Treatment) HPI  This is a 74 year old female with a recent embolic stroke and early June who presents with concerns for stroke like symptoms. Per the patient's family since her discharge, she has had recurrent episodes of facial twitching, slurred speech, and dragging her left foot. Following her stroke in June, she has residual left upper greater than lower extremity weakness. She does ambulate at home. She is getting outpatient home health and physical therapy is scheduled to come tomorrow. Family reports that she continues to have "mini strokes." EMS was called tonight for slurred speech. Upon arrival, patient was at her baseline. She denies any new deficits on my evaluation. Family describes that these "mini strokes" as multiple events of drooling, slurred speech, difficulty focusing, and dragging her left foot. Patient is on Eliquis and aspirin.  Of note, patient was seen and evaluated on Saturday for the same. At that time her workup is reassuring and given that she is on good stroke prevention, she was to follow-up with her neurologist.  Past Medical History  Diagnosis Date  . Diabetes mellitus   . Asthma   . Chronic knee pain   . Back pain, chronic   . Hypertension   . Vertigo   . Hyperlipidemia   . TIA (transient ischemic attack)   . Anemia   . HOH (hard of hearing)   . Arthritis   . Obesity   . PMB (postmenopausal bleeding) 11/26/2014  . Urge incontinence 11/26/2014  . Thickened endometrium 01/15/2015  . Dysrhythmia   . Stroke     Mini Stroke   Past Surgical History  Procedure Laterality Date  . Appendectomy    . Ovary surgery    . Cataract extraction w/phaco Left 01/16/2013    Procedure:  CATARACT EXTRACTION PHACO AND INTRAOCULAR LENS PLACEMENT (IOC);  Surgeon: Elta Guadeloupe T. Gershon Crane, MD;  Location: [facility];  Service: Ophthalmology;  Laterality: Left;  CDE:14.37  . Endometrial biopsy  04/03/2013       . Hysteroscopy w/d&c N/A 04/24/2013    Procedure: SUCTION DILATATION AND CURETTAGE /HYSTEROSCOPY;  Surgeon: Jonnie Kind, MD;  Location: [facility];  Service: Gynecology;  Laterality: N/A;  . Knee arthroscopy with medial menisectomy Left 07/26/2013    Procedure: KNEE ARTHROSCOPY WITH PARTIAL MEDIAL MENISECTOMY;  Surgeon: Sanjuana Kava, MD;  Location: [facility];  Service: Orthopedics;  Laterality: Left;  . Endometrial ablation    . Colonoscopy  2009    Dr. Wilford Corner: 4 hyperplastic polyps removed. Small internal hemorrhoids. Recommended 5 year follow-up colonoscopy.  . Colonoscopy N/A 02/24/2015    Procedure: COLONOSCOPY;  Surgeon: Danie Binder, MD;  Location: [facility];  Service: Endoscopy;  Laterality: N/A;  1030  . Hysteroscopy w/d&c N/A 04/02/2015    Procedure: UTERINE CURETTAGE, HYSTEROSCOPY;  Surgeon: Florian Buff, MD;  Location: [facility];  Service: Gynecology;  Laterality: N/A;   Family History  Problem Relation Age of Onset  . Arrhythmia Father     Atrial fib/Pacemaker  . Heart failure Mother   . Diabetes Mother   . Other Mother     fell and broke hip  . Diabetes Sister   . Lupus Daughter   . Mental illness Son   . ADD / ADHD Son   .  Other Son     soft bones  . Diabetes Maternal Grandmother   . Stroke Paternal Grandmother   . Arthritis Paternal Grandfather   . Other Sister     MVA  . Hypertension Brother   . Diabetes Sister   . Hypertension Sister   . Colon cancer Neg Hx    History  Substance Use Topics  . Smoking status: Former Smoker -- 0.25 packs/day for 30 years    Types: Cigarettes  . Smokeless tobacco: Never Used  . Alcohol Use: No   OB History    Gravida Para Term Preterm AB TAB SAB Ectopic Multiple Living   '2 2        2     '$ Review of Systems   Constitutional: Negative for fever.  Respiratory: Positive for shortness of breath. Negative for chest tightness.   Cardiovascular: Negative for chest pain.  Gastrointestinal: Negative for nausea, vomiting and abdominal pain.  Genitourinary: Negative for dysuria.  Musculoskeletal: Negative for back pain.  Neurological: Positive for speech difficulty and weakness. Negative for headaches.  Psychiatric/Behavioral: Negative for confusion.  All other systems reviewed and are negative.     Allergies  Imitrex; Mango flavor; and Oysters  Home Medications   Prior to Admission medications   Medication Sig Start Date End Date Taking? Authorizing Provider  acetaminophen (TYLENOL) 325 MG tablet Take 650 mg by mouth every 6 (six) hours as needed for mild pain.    Yes Historical Provider, MD  apixaban (ELIQUIS) 5 MG TABS tablet Take 1 tablet (5 mg total) by mouth 2 (two) times daily. 05/21/15  Yes Daniel J Angiulli, PA-C  atorvastatin (LIPITOR) 20 MG tablet Take 1 tablet (20 mg total) by mouth daily at 6 PM. 05/21/15  Yes Lavon Paganini Angiulli, PA-C  cholecalciferol (VITAMIN D) 1000 UNITS tablet Take 1 tablet (1,000 Units total) by mouth every morning. 05/21/15  Yes Daniel J Angiulli, PA-C  diltiazem (CARTIA XT) 120 MG 24 hr capsule Take 1 capsule (120 mg total) by mouth daily. 05/21/15  Yes Daniel J Angiulli, PA-C  Linaclotide (LINZESS) 145 MCG CAPS capsule 1 PO 30 mins prior to your first meal Patient taking differently: Take 145 mcg by mouth daily.  05/09/15  Yes Carlis Stable, NP  megestrol (MEGACE) 40 MG tablet Take 1 tablet (40 mg total) by mouth daily. Patient taking differently: Take 80 mg by mouth daily.  05/22/15  Yes Reyne Dumas, MD  TOVIAZ 4 MG TB24 tablet Take 4 mg by mouth daily.  05/09/15  Yes Historical Provider, MD  TRADJENTA 5 MG TABS tablet Take 5 mg by mouth daily.  02/26/15  Yes Historical Provider, MD   BP 130/61 mmHg  Pulse 73  Temp(Src) 98.6 F (37 C) (Oral)  Resp 18  Ht 5' (1.524 m)   SpO2 99%  LMP 03/31/2013 Physical Exam  Constitutional: She is oriented to person, place, and time.  Chronically ill-appearing, overweight, no acute distress  HENT:  Head: Normocephalic and atraumatic.  Eyes: EOM are normal. Pupils are equal, round, and reactive to light.  Cardiovascular: Normal rate and normal heart sounds.   No murmur heard. Pulmonary/Chest: Effort normal. No respiratory distress. She has no wheezes.  Fine crackles bilaterally  Abdominal: Soft. Bowel sounds are normal. There is no tenderness. There is no rebound.  Neurological: She is alert and oriented to person, place, and time.  Left upper extremity with flaccid paralysis, minimal grip strength, left lower extremity with 4 out of 5 plantar and dorsi  flexion, hip flexion, knee flexion and extension, 5 out of 5 strength on the right, cranial nerves II through XII intact, speech fluent  Skin: Skin is warm and dry.  Psychiatric: She has a normal mood and affect.  Nursing note and vitals reviewed.   ED Course  Procedures (including critical care time) Labs Review Labs Reviewed  CBC WITH DIFFERENTIAL/PLATELET - Abnormal; Notable for the following:    RBC 3.72 (*)    MCV 101.3 (*)    All other components within normal limits  BASIC METABOLIC PANEL - Abnormal; Notable for the following:    Glucose, Bld 111 (*)    All other components within normal limits  URINALYSIS, ROUTINE W REFLEX MICROSCOPIC (NOT AT [facility]) - Abnormal; Notable for the following:    APPearance HAZY (*)    Hgb urine dipstick SMALL (*)    Protein, ur 30 (*)    All other components within normal limits  URINE MICROSCOPIC-ADD ON - Abnormal; Notable for the following:    Squamous Epithelial / LPF MANY (*)    Bacteria, UA MANY (*)    All other components within normal limits  TROPONIN I  BRAIN NATRIURETIC PEPTIDE    Imaging Review Dg Chest 1 View  05/27/2015   CLINICAL DATA:  Patient with weakness and slurred speech.  EXAM: CHEST  1 VIEW   COMPARISON:  Chest radiograph 05/10/2015  FINDINGS: Monitoring leads overlie the patient. Stable enlarged cardiac and mediastinal contours. No consolidative pulmonary opacities. No pleural effusion or pneumothorax.  IMPRESSION: Cardiomegaly.  No acute cardiopulmonary process.   Electronically Signed   By: Lovey Newcomer M.D.   On: 05/27/2015 02:36   Ct Head Wo Contrast  05/27/2015   CLINICAL DATA:  Patient with weakness and slurred speech. Left-sided paralysis due to prior stroke.  EXAM: CT HEAD WITHOUT CONTRAST  TECHNIQUE: Contiguous axial images were obtained from the base of the skull through the vertex without intravenous contrast.  COMPARISON:  Brain CT 05/24/2015  FINDINGS: Ventricles and sulci are appropriate for patient's age. Extensive periventricular and subcortical white matter hypodensity compatible with chronic small vessel ischemic changes. Old bilateral basal ganglia infarcts. Old right frontal lobe infarcts. No evidence for acute cortically based infarct, intracranial hemorrhage, mass lesion or mass effect. Orbits are unremarkable. Paranasal sinuses are unremarkable. Mastoid air cells are well aerated. Calvarium and intact.  IMPRESSION: Chronic small vessel ischemic changes.  No acute intracranial process.   Electronically Signed   By: Lovey Newcomer M.D.   On: 05/27/2015 02:41     EKG Interpretation   Date/Time:  Tuesday May 27 2015 01:54:41 EDT Ventricular Rate:  86 PR Interval:  215 QRS Duration: 81 QT Interval:  386 QTC Calculation: 462 R Axis:   61 Text Interpretation:  Sinus arrhythmia Borderline prolonged PR interval  Confirmed by Sabeen Piechocki  MD, [place] ([id number]) on 05/27/2015 3:37:39 AM      MDM   Final diagnoses:  Stroke-like symptoms    Patient presents with strokelike symptoms from home. Is currently at her baseline with residual deficits from prior stroke. No complaints at this time. Patient didn't endorse some shortness of breath at home. Lab work and repeat CT scan  obtained. Based on prior documentation, physical exam appears to be at baseline. Patient's family is very concerned about her having recurrent strokes. I discussed with them in length that she is on appropriate stroke prevention and it is unclear whether these episodes are true TIAs. I did evaluate for systemic illness including UTI.  Laboratory evaluation and repeat CT are reassuring. I have offered social work evaluation later this morning. The family declines. I discussed the case with Dr. Nicole Kindred, on call for neurology who agrees that the patient is on appropriate stroke prevention and does not need repeat MRI given stable neurologic exam. Family was advised to follow-up closely with outpatient neurology. Physical therapy to evaluate the patient later today. Patient may ultimately need placement.  After history, exam, and medical workup I feel the patient has been appropriately medically screened and is safe for discharge home. Pertinent diagnoses were discussed with the patient. Patient was given return precautions.     Merryl Hacker, MD 05/27/15 [phone]

## 2015-05-29 DIAGNOSIS — R300 Dysuria: Secondary | ICD-10-CM | POA: Diagnosis not present

## 2015-05-29 DIAGNOSIS — I69354 Hemiplegia and hemiparesis following cerebral infarction affecting left non-dominant side: Secondary | ICD-10-CM | POA: Diagnosis not present

## 2015-05-29 DIAGNOSIS — M25569 Pain in unspecified knee: Secondary | ICD-10-CM | POA: Diagnosis not present

## 2015-05-29 DIAGNOSIS — I480 Paroxysmal atrial fibrillation: Secondary | ICD-10-CM | POA: Diagnosis not present

## 2015-05-29 DIAGNOSIS — G8190 Hemiplegia, unspecified affecting unspecified side: Secondary | ICD-10-CM | POA: Diagnosis not present

## 2015-05-29 DIAGNOSIS — E1140 Type 2 diabetes mellitus with diabetic neuropathy, unspecified: Secondary | ICD-10-CM | POA: Diagnosis not present

## 2015-05-29 DIAGNOSIS — G4700 Insomnia, unspecified: Secondary | ICD-10-CM | POA: Diagnosis not present

## 2015-05-29 DIAGNOSIS — I10 Essential (primary) hypertension: Secondary | ICD-10-CM | POA: Diagnosis not present

## 2015-05-29 DIAGNOSIS — I639 Cerebral infarction, unspecified: Secondary | ICD-10-CM | POA: Diagnosis not present

## 2015-05-29 DIAGNOSIS — I69954 Hemiplegia and hemiparesis following unspecified cerebrovascular disease affecting left non-dominant side: Secondary | ICD-10-CM | POA: Diagnosis not present

## 2015-05-29 DIAGNOSIS — I6359 Cerebral infarction due to unspecified occlusion or stenosis of other cerebral artery: Secondary | ICD-10-CM | POA: Diagnosis not present

## 2015-05-29 DIAGNOSIS — E559 Vitamin D deficiency, unspecified: Secondary | ICD-10-CM | POA: Diagnosis not present

## 2015-05-29 DIAGNOSIS — E785 Hyperlipidemia, unspecified: Secondary | ICD-10-CM | POA: Diagnosis not present

## 2015-05-29 DIAGNOSIS — R262 Difficulty in walking, not elsewhere classified: Secondary | ICD-10-CM | POA: Diagnosis not present

## 2015-05-29 DIAGNOSIS — M6281 Muscle weakness (generalized): Secondary | ICD-10-CM | POA: Diagnosis not present

## 2015-05-29 DIAGNOSIS — E119 Type 2 diabetes mellitus without complications: Secondary | ICD-10-CM | POA: Diagnosis not present

## 2015-05-29 DIAGNOSIS — R2681 Unsteadiness on feet: Secondary | ICD-10-CM | POA: Diagnosis not present

## 2015-05-29 DIAGNOSIS — M545 Low back pain: Secondary | ICD-10-CM | POA: Diagnosis not present

## 2015-05-29 DIAGNOSIS — I4891 Unspecified atrial fibrillation: Secondary | ICD-10-CM | POA: Diagnosis not present

## 2015-05-30 DIAGNOSIS — G8190 Hemiplegia, unspecified affecting unspecified side: Secondary | ICD-10-CM | POA: Diagnosis not present

## 2015-05-30 DIAGNOSIS — I6359 Cerebral infarction due to unspecified occlusion or stenosis of other cerebral artery: Secondary | ICD-10-CM | POA: Diagnosis not present

## 2015-05-30 DIAGNOSIS — E559 Vitamin D deficiency, unspecified: Secondary | ICD-10-CM | POA: Diagnosis not present

## 2015-05-30 DIAGNOSIS — G4700 Insomnia, unspecified: Secondary | ICD-10-CM | POA: Diagnosis not present

## 2015-05-30 DIAGNOSIS — E119 Type 2 diabetes mellitus without complications: Secondary | ICD-10-CM | POA: Diagnosis not present

## 2015-05-30 DIAGNOSIS — E785 Hyperlipidemia, unspecified: Secondary | ICD-10-CM | POA: Diagnosis not present

## 2015-05-30 DIAGNOSIS — I480 Paroxysmal atrial fibrillation: Secondary | ICD-10-CM | POA: Diagnosis not present

## 2015-06-05 DIAGNOSIS — J45909 Unspecified asthma, uncomplicated: Secondary | ICD-10-CM

## 2015-06-05 DIAGNOSIS — Z7901 Long term (current) use of anticoagulants: Secondary | ICD-10-CM

## 2015-06-05 DIAGNOSIS — M1990 Unspecified osteoarthritis, unspecified site: Secondary | ICD-10-CM

## 2015-06-05 DIAGNOSIS — E669 Obesity, unspecified: Secondary | ICD-10-CM

## 2015-06-05 DIAGNOSIS — I10 Essential (primary) hypertension: Secondary | ICD-10-CM | POA: Diagnosis not present

## 2015-06-05 DIAGNOSIS — I4891 Unspecified atrial fibrillation: Secondary | ICD-10-CM | POA: Diagnosis not present

## 2015-06-05 DIAGNOSIS — E1140 Type 2 diabetes mellitus with diabetic neuropathy, unspecified: Secondary | ICD-10-CM | POA: Diagnosis not present

## 2015-06-05 DIAGNOSIS — I69354 Hemiplegia and hemiparesis following cerebral infarction affecting left non-dominant side: Secondary | ICD-10-CM | POA: Diagnosis not present

## 2015-06-06 DIAGNOSIS — I6359 Cerebral infarction due to unspecified occlusion or stenosis of other cerebral artery: Secondary | ICD-10-CM | POA: Diagnosis not present

## 2015-06-06 DIAGNOSIS — E119 Type 2 diabetes mellitus without complications: Secondary | ICD-10-CM | POA: Diagnosis not present

## 2015-06-06 DIAGNOSIS — I69954 Hemiplegia and hemiparesis following unspecified cerebrovascular disease affecting left non-dominant side: Secondary | ICD-10-CM | POA: Diagnosis not present

## 2015-06-06 DIAGNOSIS — G8100 Flaccid hemiplegia affecting unspecified side: Secondary | ICD-10-CM | POA: Diagnosis not present

## 2015-06-06 DIAGNOSIS — R2681 Unsteadiness on feet: Secondary | ICD-10-CM | POA: Diagnosis not present

## 2015-06-06 DIAGNOSIS — G90512 Complex regional pain syndrome I of left upper limb: Secondary | ICD-10-CM | POA: Diagnosis not present

## 2015-06-06 DIAGNOSIS — R262 Difficulty in walking, not elsewhere classified: Secondary | ICD-10-CM | POA: Diagnosis not present

## 2015-06-06 DIAGNOSIS — M79642 Pain in left hand: Secondary | ICD-10-CM | POA: Diagnosis not present

## 2015-06-06 DIAGNOSIS — R350 Frequency of micturition: Secondary | ICD-10-CM | POA: Diagnosis not present

## 2015-06-06 DIAGNOSIS — I480 Paroxysmal atrial fibrillation: Secondary | ICD-10-CM | POA: Diagnosis not present

## 2015-06-06 DIAGNOSIS — I639 Cerebral infarction, unspecified: Secondary | ICD-10-CM | POA: Diagnosis not present

## 2015-06-06 DIAGNOSIS — N899 Noninflammatory disorder of vagina, unspecified: Secondary | ICD-10-CM | POA: Diagnosis not present

## 2015-06-06 DIAGNOSIS — N189 Chronic kidney disease, unspecified: Secondary | ICD-10-CM | POA: Diagnosis not present

## 2015-06-06 DIAGNOSIS — N8501 Benign endometrial hyperplasia: Secondary | ICD-10-CM | POA: Diagnosis not present

## 2015-06-06 DIAGNOSIS — I69854 Hemiplegia and hemiparesis following other cerebrovascular disease affecting left non-dominant side: Secondary | ICD-10-CM | POA: Diagnosis not present

## 2015-06-06 DIAGNOSIS — I10 Essential (primary) hypertension: Secondary | ICD-10-CM | POA: Diagnosis not present

## 2015-06-06 DIAGNOSIS — M1990 Unspecified osteoarthritis, unspecified site: Secondary | ICD-10-CM | POA: Diagnosis not present

## 2015-06-06 DIAGNOSIS — N898 Other specified noninflammatory disorders of vagina: Secondary | ICD-10-CM | POA: Diagnosis not present

## 2015-06-06 DIAGNOSIS — M25569 Pain in unspecified knee: Secondary | ICD-10-CM | POA: Diagnosis not present

## 2015-06-06 DIAGNOSIS — M545 Low back pain: Secondary | ICD-10-CM | POA: Diagnosis not present

## 2015-06-06 DIAGNOSIS — M79602 Pain in left arm: Secondary | ICD-10-CM | POA: Diagnosis not present

## 2015-06-06 DIAGNOSIS — N950 Postmenopausal bleeding: Secondary | ICD-10-CM | POA: Diagnosis not present

## 2015-06-06 DIAGNOSIS — E785 Hyperlipidemia, unspecified: Secondary | ICD-10-CM | POA: Diagnosis not present

## 2015-06-06 DIAGNOSIS — G8190 Hemiplegia, unspecified affecting unspecified side: Secondary | ICD-10-CM | POA: Diagnosis not present

## 2015-06-06 DIAGNOSIS — M6281 Muscle weakness (generalized): Secondary | ICD-10-CM | POA: Diagnosis not present

## 2015-06-06 DIAGNOSIS — R42 Dizziness and giddiness: Secondary | ICD-10-CM | POA: Diagnosis not present

## 2015-06-06 DIAGNOSIS — G589 Mononeuropathy, unspecified: Secondary | ICD-10-CM | POA: Diagnosis not present

## 2015-06-07 DIAGNOSIS — R350 Frequency of micturition: Secondary | ICD-10-CM | POA: Diagnosis not present

## 2015-06-07 DIAGNOSIS — M79602 Pain in left arm: Secondary | ICD-10-CM | POA: Diagnosis not present

## 2015-06-08 DIAGNOSIS — R350 Frequency of micturition: Secondary | ICD-10-CM | POA: Diagnosis not present

## 2015-06-08 DIAGNOSIS — M79602 Pain in left arm: Secondary | ICD-10-CM | POA: Diagnosis not present

## 2015-06-13 DIAGNOSIS — G8190 Hemiplegia, unspecified affecting unspecified side: Secondary | ICD-10-CM | POA: Diagnosis not present

## 2015-06-13 DIAGNOSIS — R350 Frequency of micturition: Secondary | ICD-10-CM | POA: Diagnosis not present

## 2015-06-13 DIAGNOSIS — N899 Noninflammatory disorder of vagina, unspecified: Secondary | ICD-10-CM | POA: Diagnosis not present

## 2015-06-13 DIAGNOSIS — N898 Other specified noninflammatory disorders of vagina: Secondary | ICD-10-CM | POA: Diagnosis not present

## 2015-06-13 DIAGNOSIS — I6359 Cerebral infarction due to unspecified occlusion or stenosis of other cerebral artery: Secondary | ICD-10-CM | POA: Diagnosis not present

## 2015-06-13 DIAGNOSIS — M79602 Pain in left arm: Secondary | ICD-10-CM | POA: Diagnosis not present

## 2015-06-13 DIAGNOSIS — I480 Paroxysmal atrial fibrillation: Secondary | ICD-10-CM | POA: Diagnosis not present

## 2015-06-16 ENCOUNTER — Telehealth: Payer: Self-pay | Admitting: Neurology

## 2015-06-16 DIAGNOSIS — M79602 Pain in left arm: Secondary | ICD-10-CM | POA: Diagnosis not present

## 2015-06-16 DIAGNOSIS — I480 Paroxysmal atrial fibrillation: Secondary | ICD-10-CM | POA: Diagnosis not present

## 2015-06-16 DIAGNOSIS — I6359 Cerebral infarction due to unspecified occlusion or stenosis of other cerebral artery: Secondary | ICD-10-CM | POA: Diagnosis not present

## 2015-06-16 DIAGNOSIS — G8190 Hemiplegia, unspecified affecting unspecified side: Secondary | ICD-10-CM | POA: Diagnosis not present

## 2015-06-16 NOTE — Telephone Encounter (Signed)
Larey Seat, could you please schedule this pt with me in 2-4 weeks? Thanks a lot.  Rosalin Hawking, MD PhD Stroke Neurology 06/16/2015 4:44 PM

## 2015-06-17 DIAGNOSIS — N189 Chronic kidney disease, unspecified: Secondary | ICD-10-CM | POA: Diagnosis not present

## 2015-06-17 DIAGNOSIS — G589 Mononeuropathy, unspecified: Secondary | ICD-10-CM | POA: Diagnosis not present

## 2015-06-17 DIAGNOSIS — I480 Paroxysmal atrial fibrillation: Secondary | ICD-10-CM | POA: Diagnosis not present

## 2015-06-17 DIAGNOSIS — G8190 Hemiplegia, unspecified affecting unspecified side: Secondary | ICD-10-CM | POA: Diagnosis not present

## 2015-06-17 DIAGNOSIS — M79602 Pain in left arm: Secondary | ICD-10-CM | POA: Diagnosis not present

## 2015-06-17 DIAGNOSIS — N898 Other specified noninflammatory disorders of vagina: Secondary | ICD-10-CM | POA: Diagnosis not present

## 2015-06-17 NOTE — Telephone Encounter (Addendum)
Called patient to inform scheduled for earliest appt available on 07/29/15 @ 2pm. No answer. Left detailed vmail. Will try later.

## 2015-06-20 DIAGNOSIS — N189 Chronic kidney disease, unspecified: Secondary | ICD-10-CM | POA: Diagnosis not present

## 2015-06-20 DIAGNOSIS — I480 Paroxysmal atrial fibrillation: Secondary | ICD-10-CM | POA: Diagnosis not present

## 2015-06-20 DIAGNOSIS — G8190 Hemiplegia, unspecified affecting unspecified side: Secondary | ICD-10-CM | POA: Diagnosis not present

## 2015-06-20 DIAGNOSIS — N898 Other specified noninflammatory disorders of vagina: Secondary | ICD-10-CM | POA: Diagnosis not present

## 2015-06-20 DIAGNOSIS — M79602 Pain in left arm: Secondary | ICD-10-CM | POA: Diagnosis not present

## 2015-06-20 DIAGNOSIS — G589 Mononeuropathy, unspecified: Secondary | ICD-10-CM | POA: Diagnosis not present

## 2015-06-23 DIAGNOSIS — N898 Other specified noninflammatory disorders of vagina: Secondary | ICD-10-CM | POA: Diagnosis not present

## 2015-06-23 DIAGNOSIS — I480 Paroxysmal atrial fibrillation: Secondary | ICD-10-CM | POA: Diagnosis not present

## 2015-06-24 DIAGNOSIS — N898 Other specified noninflammatory disorders of vagina: Secondary | ICD-10-CM | POA: Diagnosis not present

## 2015-06-25 ENCOUNTER — Ambulatory Visit (INDEPENDENT_AMBULATORY_CARE_PROVIDER_SITE_OTHER): Payer: Commercial Managed Care - HMO | Admitting: Obstetrics and Gynecology

## 2015-06-25 ENCOUNTER — Encounter: Payer: Self-pay | Admitting: Obstetrics and Gynecology

## 2015-06-25 VITALS — BP 136/82 | Ht 60.0 in

## 2015-06-25 DIAGNOSIS — I6359 Cerebral infarction due to unspecified occlusion or stenosis of other cerebral artery: Secondary | ICD-10-CM | POA: Diagnosis not present

## 2015-06-25 DIAGNOSIS — N898 Other specified noninflammatory disorders of vagina: Secondary | ICD-10-CM | POA: Diagnosis not present

## 2015-06-25 DIAGNOSIS — N950 Postmenopausal bleeding: Secondary | ICD-10-CM

## 2015-06-25 DIAGNOSIS — N8501 Benign endometrial hyperplasia: Secondary | ICD-10-CM

## 2015-06-25 DIAGNOSIS — I480 Paroxysmal atrial fibrillation: Secondary | ICD-10-CM | POA: Diagnosis not present

## 2015-06-25 DIAGNOSIS — R350 Frequency of micturition: Secondary | ICD-10-CM | POA: Diagnosis not present

## 2015-06-25 DIAGNOSIS — M79602 Pain in left arm: Secondary | ICD-10-CM | POA: Diagnosis not present

## 2015-06-25 DIAGNOSIS — N899 Noninflammatory disorder of vagina, unspecified: Secondary | ICD-10-CM | POA: Diagnosis not present

## 2015-06-25 DIAGNOSIS — G8190 Hemiplegia, unspecified affecting unspecified side: Secondary | ICD-10-CM | POA: Diagnosis not present

## 2015-06-25 NOTE — Progress Notes (Signed)
Patient ID: [name], female   DOB: 1941/02/14, 74 y.o.   MRN: [id number] Pt here today for vaginal bleeding. Pt states that she has noticed it for a few days. Pt states that the bleeding started light and has gotten heavier. Pt states that she has had a lot cramping like labor pain.

## 2015-06-25 NOTE — Progress Notes (Addendum)
Patient ID: [name], female   DOB: 12-02-1941, 74 y.o.   MRN: [id number]  Pt here today for vaginal bleeding. Pt states that she has noticed it for a few days. Pt states that the bleeding started light and has gotten heavier. Pt states that she has had a lot cramping like labor pain.      [facility] Visit  Patient name: [name] MRN [id number]  Date of birth: 02/17/1941  CC & HPI:  [name] is a 74 y.o. female presenting today for atraumatic, sudden onset, heavy vaginal bleeding. Pt is currently on blood thinners (eliquix, '5mg'$ /daily). Pt further notes that she had a stroke on 05/2015 and now has left arm paralysis and weakness of BLE.   Pt had a EEC completed on 04/02/15 with the following findings:  Endometrium, curettage - FRAGMENT OF ENDOMETRIUM WITH SIMPLE AND COMPLEX ENDOMETRIAL HYPERPLASIA WITHOUT ATYPIA (VERY LIMITED MATERIAL). - BLOOD CLOT AND DETACHED FRAGMENTS OF BENIGN SQUAMOUS EPITHELIUM WITH NO EVIDENCE OF DYSPLASIA OR MALIGNANCY.   ROS:  A complete 10 system review of systems was obtained and all systems are negative except as noted in the HPI and PMH.    Pertinent History Reviewed:   Reviewed: Significant for stroke, DM, ovary surgery, endometrial biopsy, endometrial ablation.  Medical         Past Medical History  Diagnosis Date  . Diabetes mellitus   . Asthma   . Chronic knee pain   . Back pain, chronic   . Hypertension   . Vertigo   . Hyperlipidemia   . TIA (transient ischemic attack)   . Anemia   . HOH (hard of hearing)   . Arthritis   . Obesity   . PMB (postmenopausal bleeding) 11/26/2014  . Urge incontinence 11/26/2014  . Thickened endometrium 01/15/2015  . Dysrhythmia   . Stroke     Mini Stroke                              Surgical Hx:    Past Surgical History  Procedure Laterality Date  . Appendectomy    . Ovary surgery    . Cataract extraction w/phaco Left 01/16/2013    Procedure: CATARACT EXTRACTION PHACO AND  INTRAOCULAR LENS PLACEMENT (IOC);  Surgeon: Tiffany Guadeloupe T. Gershon Crane, MD;  Location: [facility];  Service: Ophthalmology;  Laterality: Left;  CDE:14.37  . Endometrial biopsy  04/03/2013       . Hysteroscopy w/d&c N/A 04/24/2013    Procedure: SUCTION DILATATION AND CURETTAGE /HYSTEROSCOPY;  Surgeon: Tiffany Kind, MD;  Location: [facility];  Service: Gynecology;  Laterality: N/A;  . Knee arthroscopy with medial menisectomy Left 07/26/2013    Procedure: KNEE ARTHROSCOPY WITH PARTIAL MEDIAL MENISECTOMY;  Surgeon: Tiffany Kava, MD;  Location: [facility];  Service: Orthopedics;  Laterality: Left;  . Endometrial ablation    . Colonoscopy  2009    Dr. Wilford Simmons: 4 hyperplastic polyps removed. Small internal hemorrhoids. Recommended 5 year follow-up colonoscopy.  . Colonoscopy N/A 02/24/2015    Procedure: COLONOSCOPY;  Surgeon: Tiffany Binder, MD;  [facility]: [facility];  Service: Endoscopy;  Laterality: N/A;  1030  . Hysteroscopy w/d&c N/A 04/02/2015    Procedure: UTERINE CURETTAGE, HYSTEROSCOPY;  Surgeon: Tiffany Buff, MD;  Location: [facility];  Service: Gynecology;  Laterality: N/A;   Medications: Reviewed & Updated - see associated section  Current outpatient prescriptions:  .  acetaminophen (TYLENOL) 325 MG tablet, Take 650 mg by mouth every 6 (six) hours as needed for mild pain. , Disp: , Rfl:  .  apixaban (ELIQUIS) 5 MG TABS tablet, Take 1 tablet (5 mg total) by mouth 2 (two) times daily., Disp: 60 tablet, Rfl: 3 .  atorvastatin (LIPITOR) 20 MG tablet, Take 1 tablet (20 mg total) by mouth daily at 6 PM., Disp: 30 tablet, Rfl: 2 .  cholecalciferol (VITAMIN D) 1000 UNITS tablet, Take 1 tablet (1,000 Units total) by mouth every morning., Disp: 30 tablet, Rfl: 1 .  diltiazem (CARTIA XT) 120 MG 24 hr capsule, Take 1 capsule (120 mg total) by mouth daily., Disp: 30 capsule, Rfl: 1 .  Linaclotide (LINZESS) 145 MCG CAPS capsule, 1 PO 30 mins prior to your first meal (Patient taking differently:  Take 145 mcg by mouth daily. ), Disp: 30 capsule, Rfl: 11 .  TRADJENTA 5 MG TABS tablet, Take 5 mg by mouth daily. , Disp: , Rfl: 0 .  megestrol (MEGACE) 40 MG tablet, Take 1 tablet (40 mg total) by mouth daily. (Patient not taking: Reported on 06/25/2015), Disp: 30 tablet, Rfl: 0   Social History: Reviewed -  reports that she has quit smoking. Her smoking use included Cigarettes. She has a 7.5 pack-year smoking history. She has never used smokeless tobacco.  Objective Findings:  Vitals: Blood pressure 136/82, height 5' (1.524 m), last menstrual period 03/31/2013.  Physical Examination: General appearance - alert, well appearing, and in no distress, oriented to person, place, and time and overweight Mental status - alert, oriented to person, place, and time, normal mood, behavior, speech, dress, motor activity (unable to move left arm due to paralysis secondary to stroke on 6/216 and associated weakness of BLE), and thought processes, affect appropriate to mood Pelvic - normal external genitalia VULVA: normal appearing vulva with no masses, tenderness or lesions,  VAGINA: good vaginal length, no prolapse  CERVIX:  closed with clots at the surface UTERUS: uterus is normal size, shape, consistency and nontender,  ADNEXA: normal adnexa in size, nontender and no masses,   Transvaginal Ultrasound performed by Dr. Glo Simmons; findings: thin endometrium with obscure 8 mm thickness, no foreign bodies visualized  Assessment & Plan:   A:  1. postmeno bleeding on anticoagulant due to CVA 2. Morbid obesity 3. Hx  Simple and Complex Endometrial  Hyperplasia WITHOUT ATYPIA on recent D&C   P: 1  Megace 40 tid 1. IUD placement if bleeding persists  2. Discussed weight loss and physical benefits of losing weight   This chart was SCRIBED for Tiffany Shirk, MD by [name], ED Scribe. This patient was seen in room 2 and the patient's care was started at 11:18 AM.  I personally performed the services  described in this documentation, which was SCRIBED in my presence. The recorded information has been reviewed and considered accurate. It has been edited as necessary during review. Tiffany Kind, MD

## 2015-06-26 DIAGNOSIS — R350 Frequency of micturition: Secondary | ICD-10-CM | POA: Diagnosis not present

## 2015-06-26 DIAGNOSIS — M79602 Pain in left arm: Secondary | ICD-10-CM | POA: Diagnosis not present

## 2015-06-26 DIAGNOSIS — I6359 Cerebral infarction due to unspecified occlusion or stenosis of other cerebral artery: Secondary | ICD-10-CM | POA: Diagnosis not present

## 2015-06-26 DIAGNOSIS — N898 Other specified noninflammatory disorders of vagina: Secondary | ICD-10-CM | POA: Diagnosis not present

## 2015-06-26 DIAGNOSIS — I480 Paroxysmal atrial fibrillation: Secondary | ICD-10-CM | POA: Diagnosis not present

## 2015-06-26 DIAGNOSIS — G8190 Hemiplegia, unspecified affecting unspecified side: Secondary | ICD-10-CM | POA: Diagnosis not present

## 2015-06-26 DIAGNOSIS — N899 Noninflammatory disorder of vagina, unspecified: Secondary | ICD-10-CM | POA: Diagnosis not present

## 2015-06-27 ENCOUNTER — Encounter: Payer: Self-pay | Admitting: Physical Medicine & Rehabilitation

## 2015-06-27 ENCOUNTER — Encounter: Payer: Commercial Managed Care - HMO | Attending: Physical Medicine & Rehabilitation

## 2015-06-27 ENCOUNTER — Ambulatory Visit (HOSPITAL_BASED_OUTPATIENT_CLINIC_OR_DEPARTMENT_OTHER): Payer: Commercial Managed Care - HMO | Admitting: Physical Medicine & Rehabilitation

## 2015-06-27 VITALS — BP 150/80 | HR 78 | Resp 14

## 2015-06-27 DIAGNOSIS — G90512 Complex regional pain syndrome I of left upper limb: Secondary | ICD-10-CM | POA: Diagnosis not present

## 2015-06-27 DIAGNOSIS — N898 Other specified noninflammatory disorders of vagina: Secondary | ICD-10-CM | POA: Diagnosis not present

## 2015-06-27 DIAGNOSIS — E119 Type 2 diabetes mellitus without complications: Secondary | ICD-10-CM | POA: Diagnosis not present

## 2015-06-27 DIAGNOSIS — M1990 Unspecified osteoarthritis, unspecified site: Secondary | ICD-10-CM | POA: Diagnosis not present

## 2015-06-27 DIAGNOSIS — G8100 Flaccid hemiplegia affecting unspecified side: Secondary | ICD-10-CM | POA: Diagnosis not present

## 2015-06-27 DIAGNOSIS — M79642 Pain in left hand: Secondary | ICD-10-CM | POA: Diagnosis not present

## 2015-06-27 DIAGNOSIS — I10 Essential (primary) hypertension: Secondary | ICD-10-CM | POA: Diagnosis not present

## 2015-06-27 DIAGNOSIS — E785 Hyperlipidemia, unspecified: Secondary | ICD-10-CM | POA: Diagnosis not present

## 2015-06-27 DIAGNOSIS — I69854 Hemiplegia and hemiparesis following other cerebrovascular disease affecting left non-dominant side: Secondary | ICD-10-CM | POA: Insufficient documentation

## 2015-06-27 DIAGNOSIS — I480 Paroxysmal atrial fibrillation: Secondary | ICD-10-CM | POA: Diagnosis not present

## 2015-06-27 DIAGNOSIS — G90519 Complex regional pain syndrome I of unspecified upper limb: Secondary | ICD-10-CM | POA: Insufficient documentation

## 2015-06-27 DIAGNOSIS — I69354 Hemiplegia and hemiparesis following cerebral infarction affecting left non-dominant side: Secondary | ICD-10-CM

## 2015-06-27 DIAGNOSIS — I6359 Cerebral infarction due to unspecified occlusion or stenosis of other cerebral artery: Secondary | ICD-10-CM | POA: Diagnosis not present

## 2015-06-27 NOTE — Patient Instructions (Signed)
Prednisone '10mg'$  4 tablets a day for 3days 3 tablets a day for 3 days 2 tablets a day for 3 days 1 tablet a day for 3 days   Charlett Blake M.D. [facility] FAAPM&R (Sports Med, Neuromuscular Med) Diplomate Am Board of Electrodiagnostic Med   Diagnosis is complex regional pain syndrone Left hand

## 2015-06-27 NOTE — Progress Notes (Signed)
Subjective:    Patient ID: [name], female    DOB: Oct 26, 1941, 74 y.o.   MRN: [id number] 74 year old right-handed female with history of atrial fibrillation, maintained on aspirin therapy; diabetes mellitus; hypertension; right ACA infarct in August 2015, had initially been placed on Eliquis, but discontinued due to vaginal bleeding.  She lives with her daughter, used a cane and a walker prior to admission.  Presented on May 10, 2015, with left-sided weakness.  MRI of the brain showed acute nonhemorrhagic infarct, right caudate, acute nonhemorrhagic infarct peri-sagittal distribution within the right frontal lobe and anterior aspect of the right parietal lobe.  Remote anterior right frontal lobe genu of the corpus callosum. Moderate small vessel disease.  MRA of the head with high-grade stenosis of proximal right middle cerebral artery branches just beyond the bifurcation. Carotid Dopplers with no ICA stenosis.  The patient did not receive tPA. Echocardiogram with ejection fraction of 35% grade 1 diastolic dysfunction without emboli.   DATE OF ADMISSION:  05/14/2015 DATE OF DISCHARGE:  05/21/2015  HPI   Chief complaint is worsening left hand pain  Pt went home for a week but then was admitted to [facility]  Pt getting PT, OT  Left Hand pain mainly at night.Patient has had swelling. Denies any trauma to that area. Her occupational therapist put her in a sling.  No history of hand problems prior to the stroke Patient also has some shoulder pain but this is mainly with range of motion. Patient has inability to move left hand. This has been since the stroke. Pain Inventory Average Pain 8 Pain Right Now 10 My pain is intermittent, sharp, dull and aching  In the last 24 hours, has pain interfered with the following? General activity 5 Relation with others 5 Enjoyment of life 5 What TIME of day is your pain at its worst? night Sleep (in general) Good  Pain is worse with:  sitting Pain improves with: therapy/exercise Relief from Meds: 2  Mobility walk with assistance how many minutes can you walk? 2 ability to climb steps?  no do you drive?  no use a wheelchair  Function retired  Neuro/Psych numbness tingling  Prior Studies Any changes since last visit?  no  Physicians involved in your care Any changes since last visit?  no   Family History  Problem Relation Age of Onset  . Arrhythmia Father     Atrial fib/Pacemaker  . Heart failure Mother   . Diabetes Mother   . Other Mother     fell and broke hip  . Diabetes Sister   . Lupus Daughter   . Mental illness Son   . ADD / ADHD Son   . Other Son     soft bones  . Diabetes Maternal Grandmother   . Stroke Paternal Grandmother   . Arthritis Paternal Grandfather   . Other Sister     MVA  . Hypertension Brother   . Diabetes Sister   . Hypertension Sister   . Colon cancer Neg Hx    History   Social History  . Marital Status: Widowed    Spouse Name: N/A  . Number of Children: 2  . Years of Education: N/A   Occupational History  . retired    Social History Main Topics  . Smoking status: Former Smoker -- 0.25 packs/day for 30 years    Types: Cigarettes  . Smokeless tobacco: Never Used  . Alcohol Use: No  . Drug Use: No  .  Sexual Activity: Not Currently    Birth Control/ Protection: Post-menopausal     Comment: ablation   Other Topics Concern  . None   Social History Narrative   Past Surgical History  Procedure Laterality Date  . Appendectomy    . Ovary surgery    . Cataract extraction w/phaco Left 01/16/2013    Procedure: CATARACT EXTRACTION PHACO AND INTRAOCULAR LENS PLACEMENT (IOC);  Surgeon: Elta Guadeloupe T. Gershon Crane, MD;  Location: [facility];  Service: Ophthalmology;  Laterality: Left;  CDE:14.37  . Endometrial biopsy  04/03/2013       . Hysteroscopy w/d&c N/A 04/24/2013    Procedure: SUCTION DILATATION AND CURETTAGE /HYSTEROSCOPY;  Surgeon: Jonnie Kind, MD;  Location: [facility];  Service: Gynecology;  Laterality: N/A;  . Knee arthroscopy with medial menisectomy Left 07/26/2013    Procedure: KNEE ARTHROSCOPY WITH PARTIAL MEDIAL MENISECTOMY;  Surgeon: Sanjuana Kava, MD;  Location: [facility];  Service: Orthopedics;  Laterality: Left;  . Endometrial ablation    . Colonoscopy  2009    Dr. Wilford Corner: 4 hyperplastic polyps removed. Small internal hemorrhoids. Recommended 5 year follow-up colonoscopy.  . Colonoscopy N/A 02/24/2015    Procedure: COLONOSCOPY;  Surgeon: Danie Binder, MD;  Location: [facility];  Service: Endoscopy;  Laterality: N/A;  1030  . Hysteroscopy w/d&c N/A 04/02/2015    Procedure: UTERINE CURETTAGE, HYSTEROSCOPY;  Surgeon: Florian Buff, MD;  Location: [facility];  Service: Gynecology;  Laterality: N/A;   Past Medical History  Diagnosis Date  . Diabetes mellitus   . Asthma   . Chronic knee pain   . Back pain, chronic   . Hypertension   . Vertigo   . Hyperlipidemia   . TIA (transient ischemic attack)   . Anemia   . HOH (hard of hearing)   . Arthritis   . Obesity   . PMB (postmenopausal bleeding) 11/26/2014  . Urge incontinence 11/26/2014  . Thickened endometrium 01/15/2015  . Dysrhythmia   . Stroke     Mini Stroke   BP 150/80 mmHg  Pulse 78  Resp 14  SpO2 98%  LMP 03/31/2013  Opioid Risk Score:   Fall Risk Score:  `1  Depression screen PHQ 2/9  Depression screen PHQ 2/9 06/27/2015  Decreased Interest 1  Down, Depressed, Hopeless 0  PHQ - 2 Score 1  Altered sleeping 3  Tired, decreased energy 2  Change in appetite 0  Feeling bad or failure about yourself  0  Trouble concentrating 3  Moving slowly or fidgety/restless 0  Suicidal thoughts 0  PHQ-9 Score 9     Review of Systems  HENT: Negative.   Eyes: Negative.   Respiratory: Negative.   Cardiovascular: Negative.   Gastrointestinal: Negative.   Endocrine: Negative.   Genitourinary: Negative.   Musculoskeletal: Positive for myalgias, joint swelling and  arthralgias.       Right hand swelling and pain, arm and shoulder pain  Allergic/Immunologic: Negative.   Neurological: Positive for weakness.  Hematological: Negative.   Psychiatric/Behavioral: Positive for decreased concentration.       Objective:   Physical Exam  Constitutional: She is oriented to person, place, and time. She appears well-developed and well-nourished.  HENT:  Head: Normocephalic and atraumatic.  Right Ear: External ear normal.  Left Ear: External ear normal.  Eyes: Conjunctivae and EOM are normal. Pupils are equal, round, and reactive to light.  Musculoskeletal:       Right shoulder: Normal.       Left shoulder:  She exhibits decreased range of motion and pain. She exhibits no effusion.       Right elbow: Normal.      Left elbow: She exhibits decreased range of motion. She exhibits no effusion and no deformity. No tenderness found.       Right wrist: Normal.       Right hand: Normal.       Left hand: She exhibits decreased range of motion, tenderness and swelling. Decreased sensation noted. Decreased sensation is present in the ulnar distribution, is present in the medial distribution and is present in the radial distribution. Decreased strength noted. She exhibits finger abduction, thumb/finger opposition and wrist extension trouble.  3+ edema dorsum of the left hand. No skin lesions. Also has edema of the fingers but no joint swelling  Neurological: She is alert and oriented to person, place, and time. She displays no tremor. A sensory deficit is present. No cranial nerve deficit. She displays no seizure activity. GCS eye subscore is 4. GCS verbal subscore is 5. GCS motor subscore is 6.  Reflex Scores:      Bicep reflexes are 0 on the left side.      Brachioradialis reflexes are 0 on the left side. Sensation absent to light touch in the left hand. Patient has tenderness with MCP compression of the left hand Motor strength is 2 minus at the left deltoid and biceps  otherwise 0/5 in the left upper extremity 4 minus in the left hip flexor and knee extensor and ankle dorsiflexor and plantar flexor Right side is 5/5 strength throughout  Patient was not tested the patient did not have her assisted device with or a gait belt.   Tone is reduced in the left upper extremity  Psychiatric: She has a normal mood and affect. Her speech is normal and behavior is normal. Thought content normal. She exhibits abnormal recent memory.  Nursing note and vitals reviewed.         Assessment & Plan:  1. Left hemiparesis secondary to right caudate and right medial frontal infarcts, her main residual deficit includes left upper extremity greater than left lower extremity weakness. She requires additional PT and OT to maximize left upper extremity functioning, reduce edema and pain as well as increase functional mobility  2. Left hand pain and swelling. Differential diagnosis includesLeft upper extremity complex regional pain syndrome primarily affecting the left hand.Alternatively she may just have some dependent edema with some left shoulder pain related to subacromial impingement. In either case it is rather severe and I think it is reasonable to treat her with a 12 day course of prednisone.  At this point I do not think imaging studies are needed but may consider at next visit if not getting much better  Discussed with patient and her daughter they agree with this plan  Over half of the 25 min visit was spent counseling and coordinating care.

## 2015-06-30 DIAGNOSIS — I639 Cerebral infarction, unspecified: Secondary | ICD-10-CM | POA: Diagnosis not present

## 2015-06-30 DIAGNOSIS — M79602 Pain in left arm: Secondary | ICD-10-CM | POA: Diagnosis not present

## 2015-06-30 DIAGNOSIS — G8190 Hemiplegia, unspecified affecting unspecified side: Secondary | ICD-10-CM | POA: Diagnosis not present

## 2015-06-30 DIAGNOSIS — N898 Other specified noninflammatory disorders of vagina: Secondary | ICD-10-CM | POA: Diagnosis not present

## 2015-06-30 DIAGNOSIS — M6281 Muscle weakness (generalized): Secondary | ICD-10-CM | POA: Diagnosis not present

## 2015-06-30 DIAGNOSIS — D649 Anemia, unspecified: Secondary | ICD-10-CM | POA: Diagnosis not present

## 2015-06-30 DIAGNOSIS — M1000 Idiopathic gout, unspecified site: Secondary | ICD-10-CM | POA: Diagnosis not present

## 2015-06-30 DIAGNOSIS — I69954 Hemiplegia and hemiparesis following unspecified cerebrovascular disease affecting left non-dominant side: Secondary | ICD-10-CM | POA: Diagnosis not present

## 2015-06-30 DIAGNOSIS — I480 Paroxysmal atrial fibrillation: Secondary | ICD-10-CM | POA: Diagnosis not present

## 2015-06-30 DIAGNOSIS — R2681 Unsteadiness on feet: Secondary | ICD-10-CM | POA: Diagnosis not present

## 2015-06-30 DIAGNOSIS — R262 Difficulty in walking, not elsewhere classified: Secondary | ICD-10-CM | POA: Diagnosis not present

## 2015-07-01 ENCOUNTER — Ambulatory Visit: Payer: Commercial Managed Care - HMO | Admitting: Obstetrics and Gynecology

## 2015-07-01 DIAGNOSIS — R262 Difficulty in walking, not elsewhere classified: Secondary | ICD-10-CM | POA: Diagnosis not present

## 2015-07-01 DIAGNOSIS — I69954 Hemiplegia and hemiparesis following unspecified cerebrovascular disease affecting left non-dominant side: Secondary | ICD-10-CM | POA: Diagnosis not present

## 2015-07-01 DIAGNOSIS — I639 Cerebral infarction, unspecified: Secondary | ICD-10-CM | POA: Diagnosis not present

## 2015-07-01 DIAGNOSIS — M6281 Muscle weakness (generalized): Secondary | ICD-10-CM | POA: Diagnosis not present

## 2015-07-01 DIAGNOSIS — R2681 Unsteadiness on feet: Secondary | ICD-10-CM | POA: Diagnosis not present

## 2015-07-02 DIAGNOSIS — I639 Cerebral infarction, unspecified: Secondary | ICD-10-CM | POA: Diagnosis not present

## 2015-07-02 DIAGNOSIS — R2681 Unsteadiness on feet: Secondary | ICD-10-CM | POA: Diagnosis not present

## 2015-07-02 DIAGNOSIS — I69954 Hemiplegia and hemiparesis following unspecified cerebrovascular disease affecting left non-dominant side: Secondary | ICD-10-CM | POA: Diagnosis not present

## 2015-07-02 DIAGNOSIS — M6281 Muscle weakness (generalized): Secondary | ICD-10-CM | POA: Diagnosis not present

## 2015-07-02 DIAGNOSIS — R262 Difficulty in walking, not elsewhere classified: Secondary | ICD-10-CM | POA: Diagnosis not present

## 2015-07-03 DIAGNOSIS — I639 Cerebral infarction, unspecified: Secondary | ICD-10-CM | POA: Diagnosis not present

## 2015-07-03 DIAGNOSIS — D649 Anemia, unspecified: Secondary | ICD-10-CM | POA: Diagnosis not present

## 2015-07-03 DIAGNOSIS — R2681 Unsteadiness on feet: Secondary | ICD-10-CM | POA: Diagnosis not present

## 2015-07-03 DIAGNOSIS — D509 Iron deficiency anemia, unspecified: Secondary | ICD-10-CM | POA: Diagnosis not present

## 2015-07-03 DIAGNOSIS — I480 Paroxysmal atrial fibrillation: Secondary | ICD-10-CM | POA: Diagnosis not present

## 2015-07-03 DIAGNOSIS — R262 Difficulty in walking, not elsewhere classified: Secondary | ICD-10-CM | POA: Diagnosis not present

## 2015-07-03 DIAGNOSIS — N898 Other specified noninflammatory disorders of vagina: Secondary | ICD-10-CM | POA: Diagnosis not present

## 2015-07-03 DIAGNOSIS — M79602 Pain in left arm: Secondary | ICD-10-CM | POA: Diagnosis not present

## 2015-07-03 DIAGNOSIS — I69954 Hemiplegia and hemiparesis following unspecified cerebrovascular disease affecting left non-dominant side: Secondary | ICD-10-CM | POA: Diagnosis not present

## 2015-07-03 DIAGNOSIS — M6281 Muscle weakness (generalized): Secondary | ICD-10-CM | POA: Diagnosis not present

## 2015-07-03 DIAGNOSIS — G8190 Hemiplegia, unspecified affecting unspecified side: Secondary | ICD-10-CM | POA: Diagnosis not present

## 2015-07-04 DIAGNOSIS — M6281 Muscle weakness (generalized): Secondary | ICD-10-CM | POA: Diagnosis not present

## 2015-07-04 DIAGNOSIS — R262 Difficulty in walking, not elsewhere classified: Secondary | ICD-10-CM | POA: Diagnosis not present

## 2015-07-04 DIAGNOSIS — I639 Cerebral infarction, unspecified: Secondary | ICD-10-CM | POA: Diagnosis not present

## 2015-07-04 DIAGNOSIS — I69954 Hemiplegia and hemiparesis following unspecified cerebrovascular disease affecting left non-dominant side: Secondary | ICD-10-CM | POA: Diagnosis not present

## 2015-07-04 DIAGNOSIS — R2681 Unsteadiness on feet: Secondary | ICD-10-CM | POA: Diagnosis not present

## 2015-07-07 DIAGNOSIS — D509 Iron deficiency anemia, unspecified: Secondary | ICD-10-CM | POA: Diagnosis not present

## 2015-07-07 DIAGNOSIS — E08319 Diabetes mellitus due to underlying condition with unspecified diabetic retinopathy without macular edema: Secondary | ICD-10-CM | POA: Diagnosis not present

## 2015-07-07 DIAGNOSIS — R2681 Unsteadiness on feet: Secondary | ICD-10-CM | POA: Diagnosis not present

## 2015-07-07 DIAGNOSIS — D649 Anemia, unspecified: Secondary | ICD-10-CM | POA: Diagnosis not present

## 2015-07-07 DIAGNOSIS — M6281 Muscle weakness (generalized): Secondary | ICD-10-CM | POA: Diagnosis not present

## 2015-07-07 DIAGNOSIS — I639 Cerebral infarction, unspecified: Secondary | ICD-10-CM | POA: Diagnosis not present

## 2015-07-07 DIAGNOSIS — I69954 Hemiplegia and hemiparesis following unspecified cerebrovascular disease affecting left non-dominant side: Secondary | ICD-10-CM | POA: Diagnosis not present

## 2015-07-07 DIAGNOSIS — N189 Chronic kidney disease, unspecified: Secondary | ICD-10-CM | POA: Diagnosis not present

## 2015-07-07 DIAGNOSIS — M7989 Other specified soft tissue disorders: Secondary | ICD-10-CM | POA: Diagnosis not present

## 2015-07-07 DIAGNOSIS — R262 Difficulty in walking, not elsewhere classified: Secondary | ICD-10-CM | POA: Diagnosis not present

## 2015-07-07 DIAGNOSIS — Z79899 Other long term (current) drug therapy: Secondary | ICD-10-CM | POA: Diagnosis not present

## 2015-07-07 DIAGNOSIS — G8190 Hemiplegia, unspecified affecting unspecified side: Secondary | ICD-10-CM | POA: Diagnosis not present

## 2015-07-07 DIAGNOSIS — N898 Other specified noninflammatory disorders of vagina: Secondary | ICD-10-CM | POA: Diagnosis not present

## 2015-07-08 DIAGNOSIS — D509 Iron deficiency anemia, unspecified: Secondary | ICD-10-CM | POA: Diagnosis not present

## 2015-07-08 DIAGNOSIS — M6281 Muscle weakness (generalized): Secondary | ICD-10-CM | POA: Diagnosis not present

## 2015-07-08 DIAGNOSIS — Z79899 Other long term (current) drug therapy: Secondary | ICD-10-CM | POA: Diagnosis not present

## 2015-07-08 DIAGNOSIS — I639 Cerebral infarction, unspecified: Secondary | ICD-10-CM | POA: Diagnosis not present

## 2015-07-08 DIAGNOSIS — I69954 Hemiplegia and hemiparesis following unspecified cerebrovascular disease affecting left non-dominant side: Secondary | ICD-10-CM | POA: Diagnosis not present

## 2015-07-08 DIAGNOSIS — R262 Difficulty in walking, not elsewhere classified: Secondary | ICD-10-CM | POA: Diagnosis not present

## 2015-07-08 DIAGNOSIS — R2681 Unsteadiness on feet: Secondary | ICD-10-CM | POA: Diagnosis not present

## 2015-07-09 DIAGNOSIS — R262 Difficulty in walking, not elsewhere classified: Secondary | ICD-10-CM | POA: Diagnosis not present

## 2015-07-09 DIAGNOSIS — R2681 Unsteadiness on feet: Secondary | ICD-10-CM | POA: Diagnosis not present

## 2015-07-09 DIAGNOSIS — M6281 Muscle weakness (generalized): Secondary | ICD-10-CM | POA: Diagnosis not present

## 2015-07-09 DIAGNOSIS — I69954 Hemiplegia and hemiparesis following unspecified cerebrovascular disease affecting left non-dominant side: Secondary | ICD-10-CM | POA: Diagnosis not present

## 2015-07-09 DIAGNOSIS — I639 Cerebral infarction, unspecified: Secondary | ICD-10-CM | POA: Diagnosis not present

## 2015-07-10 DIAGNOSIS — R262 Difficulty in walking, not elsewhere classified: Secondary | ICD-10-CM | POA: Diagnosis not present

## 2015-07-10 DIAGNOSIS — M6281 Muscle weakness (generalized): Secondary | ICD-10-CM | POA: Diagnosis not present

## 2015-07-10 DIAGNOSIS — R2681 Unsteadiness on feet: Secondary | ICD-10-CM | POA: Diagnosis not present

## 2015-07-10 DIAGNOSIS — I69954 Hemiplegia and hemiparesis following unspecified cerebrovascular disease affecting left non-dominant side: Secondary | ICD-10-CM | POA: Diagnosis not present

## 2015-07-10 DIAGNOSIS — I639 Cerebral infarction, unspecified: Secondary | ICD-10-CM | POA: Diagnosis not present

## 2015-07-11 DIAGNOSIS — I69954 Hemiplegia and hemiparesis following unspecified cerebrovascular disease affecting left non-dominant side: Secondary | ICD-10-CM | POA: Diagnosis not present

## 2015-07-11 DIAGNOSIS — R262 Difficulty in walking, not elsewhere classified: Secondary | ICD-10-CM | POA: Diagnosis not present

## 2015-07-11 DIAGNOSIS — I639 Cerebral infarction, unspecified: Secondary | ICD-10-CM | POA: Diagnosis not present

## 2015-07-11 DIAGNOSIS — M6281 Muscle weakness (generalized): Secondary | ICD-10-CM | POA: Diagnosis not present

## 2015-07-11 DIAGNOSIS — R2681 Unsteadiness on feet: Secondary | ICD-10-CM | POA: Diagnosis not present

## 2015-07-12 DIAGNOSIS — R2681 Unsteadiness on feet: Secondary | ICD-10-CM | POA: Diagnosis not present

## 2015-07-12 DIAGNOSIS — I639 Cerebral infarction, unspecified: Secondary | ICD-10-CM | POA: Diagnosis not present

## 2015-07-12 DIAGNOSIS — R262 Difficulty in walking, not elsewhere classified: Secondary | ICD-10-CM | POA: Diagnosis not present

## 2015-07-12 DIAGNOSIS — M6281 Muscle weakness (generalized): Secondary | ICD-10-CM | POA: Diagnosis not present

## 2015-07-12 DIAGNOSIS — I69954 Hemiplegia and hemiparesis following unspecified cerebrovascular disease affecting left non-dominant side: Secondary | ICD-10-CM | POA: Diagnosis not present

## 2015-07-14 DIAGNOSIS — R2681 Unsteadiness on feet: Secondary | ICD-10-CM | POA: Diagnosis not present

## 2015-07-14 DIAGNOSIS — M6281 Muscle weakness (generalized): Secondary | ICD-10-CM | POA: Diagnosis not present

## 2015-07-14 DIAGNOSIS — D509 Iron deficiency anemia, unspecified: Secondary | ICD-10-CM | POA: Diagnosis not present

## 2015-07-14 DIAGNOSIS — R9431 Abnormal electrocardiogram [ECG] [EKG]: Secondary | ICD-10-CM | POA: Diagnosis not present

## 2015-07-14 DIAGNOSIS — I69954 Hemiplegia and hemiparesis following unspecified cerebrovascular disease affecting left non-dominant side: Secondary | ICD-10-CM | POA: Diagnosis not present

## 2015-07-14 DIAGNOSIS — G8190 Hemiplegia, unspecified affecting unspecified side: Secondary | ICD-10-CM | POA: Diagnosis not present

## 2015-07-14 DIAGNOSIS — N390 Urinary tract infection, site not specified: Secondary | ICD-10-CM | POA: Diagnosis not present

## 2015-07-14 DIAGNOSIS — Z79899 Other long term (current) drug therapy: Secondary | ICD-10-CM | POA: Diagnosis not present

## 2015-07-14 DIAGNOSIS — R319 Hematuria, unspecified: Secondary | ICD-10-CM | POA: Diagnosis not present

## 2015-07-14 DIAGNOSIS — I639 Cerebral infarction, unspecified: Secondary | ICD-10-CM | POA: Diagnosis not present

## 2015-07-14 DIAGNOSIS — I480 Paroxysmal atrial fibrillation: Secondary | ICD-10-CM | POA: Diagnosis not present

## 2015-07-14 DIAGNOSIS — R300 Dysuria: Secondary | ICD-10-CM | POA: Diagnosis not present

## 2015-07-14 DIAGNOSIS — D649 Anemia, unspecified: Secondary | ICD-10-CM | POA: Diagnosis not present

## 2015-07-14 DIAGNOSIS — R0602 Shortness of breath: Secondary | ICD-10-CM | POA: Diagnosis not present

## 2015-07-14 DIAGNOSIS — R262 Difficulty in walking, not elsewhere classified: Secondary | ICD-10-CM | POA: Diagnosis not present

## 2015-07-14 DIAGNOSIS — N898 Other specified noninflammatory disorders of vagina: Secondary | ICD-10-CM | POA: Diagnosis not present

## 2015-07-15 DIAGNOSIS — R262 Difficulty in walking, not elsewhere classified: Secondary | ICD-10-CM | POA: Diagnosis not present

## 2015-07-15 DIAGNOSIS — N390 Urinary tract infection, site not specified: Secondary | ICD-10-CM | POA: Diagnosis not present

## 2015-07-15 DIAGNOSIS — N898 Other specified noninflammatory disorders of vagina: Secondary | ICD-10-CM | POA: Diagnosis not present

## 2015-07-15 DIAGNOSIS — I480 Paroxysmal atrial fibrillation: Secondary | ICD-10-CM | POA: Diagnosis not present

## 2015-07-15 DIAGNOSIS — R0602 Shortness of breath: Secondary | ICD-10-CM | POA: Diagnosis not present

## 2015-07-15 DIAGNOSIS — I639 Cerebral infarction, unspecified: Secondary | ICD-10-CM | POA: Diagnosis not present

## 2015-07-15 DIAGNOSIS — M6281 Muscle weakness (generalized): Secondary | ICD-10-CM | POA: Diagnosis not present

## 2015-07-15 DIAGNOSIS — R2681 Unsteadiness on feet: Secondary | ICD-10-CM | POA: Diagnosis not present

## 2015-07-15 DIAGNOSIS — I69954 Hemiplegia and hemiparesis following unspecified cerebrovascular disease affecting left non-dominant side: Secondary | ICD-10-CM | POA: Diagnosis not present

## 2015-07-15 DIAGNOSIS — G8190 Hemiplegia, unspecified affecting unspecified side: Secondary | ICD-10-CM | POA: Diagnosis not present

## 2015-07-16 DIAGNOSIS — I639 Cerebral infarction, unspecified: Secondary | ICD-10-CM | POA: Diagnosis not present

## 2015-07-16 DIAGNOSIS — R262 Difficulty in walking, not elsewhere classified: Secondary | ICD-10-CM | POA: Diagnosis not present

## 2015-07-16 DIAGNOSIS — R2681 Unsteadiness on feet: Secondary | ICD-10-CM | POA: Diagnosis not present

## 2015-07-16 DIAGNOSIS — M6281 Muscle weakness (generalized): Secondary | ICD-10-CM | POA: Diagnosis not present

## 2015-07-16 DIAGNOSIS — I69954 Hemiplegia and hemiparesis following unspecified cerebrovascular disease affecting left non-dominant side: Secondary | ICD-10-CM | POA: Diagnosis not present

## 2015-07-17 ENCOUNTER — Encounter: Payer: Self-pay | Admitting: Obstetrics and Gynecology

## 2015-07-17 ENCOUNTER — Ambulatory Visit (INDEPENDENT_AMBULATORY_CARE_PROVIDER_SITE_OTHER): Payer: Commercial Managed Care - HMO | Admitting: Obstetrics and Gynecology

## 2015-07-17 VITALS — BP 170/100 | HR 72 | Ht 60.0 in

## 2015-07-17 DIAGNOSIS — N390 Urinary tract infection, site not specified: Secondary | ICD-10-CM | POA: Diagnosis not present

## 2015-07-17 DIAGNOSIS — N8501 Benign endometrial hyperplasia: Secondary | ICD-10-CM

## 2015-07-17 DIAGNOSIS — N950 Postmenopausal bleeding: Secondary | ICD-10-CM

## 2015-07-17 DIAGNOSIS — Z30430 Encounter for insertion of intrauterine contraceptive device: Secondary | ICD-10-CM

## 2015-07-17 DIAGNOSIS — N898 Other specified noninflammatory disorders of vagina: Secondary | ICD-10-CM | POA: Diagnosis not present

## 2015-07-17 DIAGNOSIS — R0602 Shortness of breath: Secondary | ICD-10-CM | POA: Diagnosis not present

## 2015-07-17 DIAGNOSIS — I480 Paroxysmal atrial fibrillation: Secondary | ICD-10-CM | POA: Diagnosis not present

## 2015-07-17 NOTE — Progress Notes (Signed)
Patient ID: [name], female   DOB: 1940-12-09, 74 y.o.   MRN: [id number]   [facility] PROCEDURE NOTE  [name] is a 74 y.o. G2P2 here for Mirena IUD insertion.  No GYN concerns.  Last pap smear was on 04/19/2013 and was normal. She has a history of pm bleeding, and had hysteroscopy and D&C in April showing coplex and simple hyperplasia Without atypia.  She is s/p CVA with lifetime anticoagulation and having persistent PM bleeding , very lite. IUD Insertion Procedure Note Patient identified, informed consent performed, consent signed.   Discussed risks of irregular bleeding, cramping, infection, malpositioning or misplacement of the IUD outside the uterus which may require further procedure such as laparoscopy. Time out was performed.  Urine pregnancy test negative.  Speculum placed in the vagina.  Cervix visualized.  Cleaned with Betadine x 2.  Grasped anteriorly with a single tooth tenaculum.   Uterus sounded to 8 cm.   IUD placed per manufacturer's recommendations.  Strings trimmed to 3 cm. Tenaculum was removed, good hemostasis noted.  Patient tolerated procedure well.  Transvaginal Ultrasound was not used to confirm IUD position  Patient was given post-procedure instructions.  She was advised to have motrin,for one week.  Patient was also asked to check IUD strings periodically.  Follow up in 6 month for IUD check.    Tiffany Kind, MD Attending Obstetrician & Gynecologist [facility]  This chart was scribed for Tiffany Kind, MD by Tula Nakayama, Medical Scribe. This patient was seen in room 2 and the patient's care was started at 11:37 AM.   I personally performed the services described in this documentation, which was SCRIBED in my presence. The recorded information has been reviewed and considered accurate. It has been edited as necessary during review. Tiffany Kind, MD

## 2015-07-18 DIAGNOSIS — R0602 Shortness of breath: Secondary | ICD-10-CM | POA: Diagnosis not present

## 2015-07-18 DIAGNOSIS — R262 Difficulty in walking, not elsewhere classified: Secondary | ICD-10-CM | POA: Diagnosis not present

## 2015-07-18 DIAGNOSIS — M6281 Muscle weakness (generalized): Secondary | ICD-10-CM | POA: Diagnosis not present

## 2015-07-18 DIAGNOSIS — I69954 Hemiplegia and hemiparesis following unspecified cerebrovascular disease affecting left non-dominant side: Secondary | ICD-10-CM | POA: Diagnosis not present

## 2015-07-18 DIAGNOSIS — G8190 Hemiplegia, unspecified affecting unspecified side: Secondary | ICD-10-CM | POA: Diagnosis not present

## 2015-07-18 DIAGNOSIS — I639 Cerebral infarction, unspecified: Secondary | ICD-10-CM | POA: Diagnosis not present

## 2015-07-18 DIAGNOSIS — N898 Other specified noninflammatory disorders of vagina: Secondary | ICD-10-CM | POA: Diagnosis not present

## 2015-07-18 DIAGNOSIS — R2681 Unsteadiness on feet: Secondary | ICD-10-CM | POA: Diagnosis not present

## 2015-07-18 DIAGNOSIS — N390 Urinary tract infection, site not specified: Secondary | ICD-10-CM | POA: Diagnosis not present

## 2015-07-19 DIAGNOSIS — R262 Difficulty in walking, not elsewhere classified: Secondary | ICD-10-CM | POA: Diagnosis not present

## 2015-07-19 DIAGNOSIS — I69954 Hemiplegia and hemiparesis following unspecified cerebrovascular disease affecting left non-dominant side: Secondary | ICD-10-CM | POA: Diagnosis not present

## 2015-07-19 DIAGNOSIS — M6281 Muscle weakness (generalized): Secondary | ICD-10-CM | POA: Diagnosis not present

## 2015-07-19 DIAGNOSIS — R2681 Unsteadiness on feet: Secondary | ICD-10-CM | POA: Diagnosis not present

## 2015-07-19 DIAGNOSIS — I639 Cerebral infarction, unspecified: Secondary | ICD-10-CM | POA: Diagnosis not present

## 2015-07-21 DIAGNOSIS — E08319 Diabetes mellitus due to underlying condition with unspecified diabetic retinopathy without macular edema: Secondary | ICD-10-CM | POA: Diagnosis not present

## 2015-07-21 DIAGNOSIS — I639 Cerebral infarction, unspecified: Secondary | ICD-10-CM | POA: Diagnosis not present

## 2015-07-21 DIAGNOSIS — M6281 Muscle weakness (generalized): Secondary | ICD-10-CM | POA: Diagnosis not present

## 2015-07-21 DIAGNOSIS — D509 Iron deficiency anemia, unspecified: Secondary | ICD-10-CM | POA: Diagnosis not present

## 2015-07-21 DIAGNOSIS — R2681 Unsteadiness on feet: Secondary | ICD-10-CM | POA: Diagnosis not present

## 2015-07-21 DIAGNOSIS — I69954 Hemiplegia and hemiparesis following unspecified cerebrovascular disease affecting left non-dominant side: Secondary | ICD-10-CM | POA: Diagnosis not present

## 2015-07-21 DIAGNOSIS — E785 Hyperlipidemia, unspecified: Secondary | ICD-10-CM | POA: Diagnosis not present

## 2015-07-21 DIAGNOSIS — Z79899 Other long term (current) drug therapy: Secondary | ICD-10-CM | POA: Diagnosis not present

## 2015-07-21 DIAGNOSIS — D649 Anemia, unspecified: Secondary | ICD-10-CM | POA: Diagnosis not present

## 2015-07-21 DIAGNOSIS — R262 Difficulty in walking, not elsewhere classified: Secondary | ICD-10-CM | POA: Diagnosis not present

## 2015-07-21 DIAGNOSIS — G8190 Hemiplegia, unspecified affecting unspecified side: Secondary | ICD-10-CM | POA: Diagnosis not present

## 2015-07-21 DIAGNOSIS — I480 Paroxysmal atrial fibrillation: Secondary | ICD-10-CM | POA: Diagnosis not present

## 2015-07-21 DIAGNOSIS — R0602 Shortness of breath: Secondary | ICD-10-CM | POA: Diagnosis not present

## 2015-07-21 DIAGNOSIS — N898 Other specified noninflammatory disorders of vagina: Secondary | ICD-10-CM | POA: Diagnosis not present

## 2015-07-22 ENCOUNTER — Ambulatory Visit (HOSPITAL_COMMUNITY)
Admission: RE | Admit: 2015-07-22 | Discharge: 2015-07-22 | Disposition: A | Discharge status: Home / Self Care | Payer: Commercial Managed Care - HMO | Source: Ambulatory Visit | Attending: Internal Medicine | Admitting: Internal Medicine

## 2015-07-22 DIAGNOSIS — R2681 Unsteadiness on feet: Secondary | ICD-10-CM | POA: Diagnosis not present

## 2015-07-22 DIAGNOSIS — I351 Nonrheumatic aortic (valve) insufficiency: Secondary | ICD-10-CM | POA: Insufficient documentation

## 2015-07-22 DIAGNOSIS — I639 Cerebral infarction, unspecified: Secondary | ICD-10-CM | POA: Diagnosis not present

## 2015-07-22 DIAGNOSIS — R262 Difficulty in walking, not elsewhere classified: Secondary | ICD-10-CM | POA: Diagnosis not present

## 2015-07-22 DIAGNOSIS — I4891 Unspecified atrial fibrillation: Secondary | ICD-10-CM | POA: Diagnosis not present

## 2015-07-22 DIAGNOSIS — I69954 Hemiplegia and hemiparesis following unspecified cerebrovascular disease affecting left non-dominant side: Secondary | ICD-10-CM | POA: Diagnosis not present

## 2015-07-22 DIAGNOSIS — M6281 Muscle weakness (generalized): Secondary | ICD-10-CM | POA: Diagnosis not present

## 2015-07-23 DIAGNOSIS — I69954 Hemiplegia and hemiparesis following unspecified cerebrovascular disease affecting left non-dominant side: Secondary | ICD-10-CM | POA: Diagnosis not present

## 2015-07-23 DIAGNOSIS — M6281 Muscle weakness (generalized): Secondary | ICD-10-CM | POA: Diagnosis not present

## 2015-07-23 DIAGNOSIS — I639 Cerebral infarction, unspecified: Secondary | ICD-10-CM | POA: Diagnosis not present

## 2015-07-23 DIAGNOSIS — R262 Difficulty in walking, not elsewhere classified: Secondary | ICD-10-CM | POA: Diagnosis not present

## 2015-07-23 DIAGNOSIS — R2681 Unsteadiness on feet: Secondary | ICD-10-CM | POA: Diagnosis not present

## 2015-07-24 DIAGNOSIS — R2681 Unsteadiness on feet: Secondary | ICD-10-CM | POA: Diagnosis not present

## 2015-07-24 DIAGNOSIS — R262 Difficulty in walking, not elsewhere classified: Secondary | ICD-10-CM | POA: Diagnosis not present

## 2015-07-24 DIAGNOSIS — M6281 Muscle weakness (generalized): Secondary | ICD-10-CM | POA: Diagnosis not present

## 2015-07-24 DIAGNOSIS — I639 Cerebral infarction, unspecified: Secondary | ICD-10-CM | POA: Diagnosis not present

## 2015-07-24 DIAGNOSIS — I69954 Hemiplegia and hemiparesis following unspecified cerebrovascular disease affecting left non-dominant side: Secondary | ICD-10-CM | POA: Diagnosis not present

## 2015-07-25 DIAGNOSIS — R2681 Unsteadiness on feet: Secondary | ICD-10-CM | POA: Diagnosis not present

## 2015-07-25 DIAGNOSIS — I69954 Hemiplegia and hemiparesis following unspecified cerebrovascular disease affecting left non-dominant side: Secondary | ICD-10-CM | POA: Diagnosis not present

## 2015-07-25 DIAGNOSIS — R21 Rash and other nonspecific skin eruption: Secondary | ICD-10-CM | POA: Diagnosis not present

## 2015-07-25 DIAGNOSIS — M6281 Muscle weakness (generalized): Secondary | ICD-10-CM | POA: Diagnosis not present

## 2015-07-25 DIAGNOSIS — I639 Cerebral infarction, unspecified: Secondary | ICD-10-CM | POA: Diagnosis not present

## 2015-07-25 DIAGNOSIS — R262 Difficulty in walking, not elsewhere classified: Secondary | ICD-10-CM | POA: Diagnosis not present

## 2015-07-28 DIAGNOSIS — E08319 Diabetes mellitus due to underlying condition with unspecified diabetic retinopathy without macular edema: Secondary | ICD-10-CM | POA: Diagnosis not present

## 2015-07-28 DIAGNOSIS — D649 Anemia, unspecified: Secondary | ICD-10-CM | POA: Diagnosis not present

## 2015-07-28 DIAGNOSIS — R2681 Unsteadiness on feet: Secondary | ICD-10-CM | POA: Diagnosis not present

## 2015-07-28 DIAGNOSIS — D509 Iron deficiency anemia, unspecified: Secondary | ICD-10-CM | POA: Diagnosis not present

## 2015-07-28 DIAGNOSIS — I639 Cerebral infarction, unspecified: Secondary | ICD-10-CM | POA: Diagnosis not present

## 2015-07-28 DIAGNOSIS — G8190 Hemiplegia, unspecified affecting unspecified side: Secondary | ICD-10-CM | POA: Diagnosis not present

## 2015-07-28 DIAGNOSIS — M6281 Muscle weakness (generalized): Secondary | ICD-10-CM | POA: Diagnosis not present

## 2015-07-28 DIAGNOSIS — N189 Chronic kidney disease, unspecified: Secondary | ICD-10-CM | POA: Diagnosis not present

## 2015-07-28 DIAGNOSIS — R0602 Shortness of breath: Secondary | ICD-10-CM | POA: Diagnosis not present

## 2015-07-28 DIAGNOSIS — Z79899 Other long term (current) drug therapy: Secondary | ICD-10-CM | POA: Diagnosis not present

## 2015-07-28 DIAGNOSIS — R262 Difficulty in walking, not elsewhere classified: Secondary | ICD-10-CM | POA: Diagnosis not present

## 2015-07-28 DIAGNOSIS — I69954 Hemiplegia and hemiparesis following unspecified cerebrovascular disease affecting left non-dominant side: Secondary | ICD-10-CM | POA: Diagnosis not present

## 2015-07-29 ENCOUNTER — Encounter: Payer: Self-pay | Admitting: Neurology

## 2015-07-29 ENCOUNTER — Ambulatory Visit (INDEPENDENT_AMBULATORY_CARE_PROVIDER_SITE_OTHER): Payer: Commercial Managed Care - HMO | Admitting: Neurology

## 2015-07-29 VITALS — BP 131/75 | HR 76 | Ht 60.0 in | Wt 279.4 lb

## 2015-07-29 DIAGNOSIS — E785 Hyperlipidemia, unspecified: Secondary | ICD-10-CM | POA: Insufficient documentation

## 2015-07-29 DIAGNOSIS — N939 Abnormal uterine and vaginal bleeding, unspecified: Secondary | ICD-10-CM

## 2015-07-29 DIAGNOSIS — E1159 Type 2 diabetes mellitus with other circulatory complications: Secondary | ICD-10-CM

## 2015-07-29 DIAGNOSIS — I69954 Hemiplegia and hemiparesis following unspecified cerebrovascular disease affecting left non-dominant side: Secondary | ICD-10-CM | POA: Diagnosis not present

## 2015-07-29 DIAGNOSIS — I482 Chronic atrial fibrillation, unspecified: Secondary | ICD-10-CM

## 2015-07-29 DIAGNOSIS — R2681 Unsteadiness on feet: Secondary | ICD-10-CM | POA: Diagnosis not present

## 2015-07-29 DIAGNOSIS — R262 Difficulty in walking, not elsewhere classified: Secondary | ICD-10-CM | POA: Diagnosis not present

## 2015-07-29 DIAGNOSIS — I10 Essential (primary) hypertension: Secondary | ICD-10-CM | POA: Diagnosis not present

## 2015-07-29 DIAGNOSIS — M6281 Muscle weakness (generalized): Secondary | ICD-10-CM | POA: Diagnosis not present

## 2015-07-29 DIAGNOSIS — I6340 Cerebral infarction due to embolism of unspecified cerebral artery: Secondary | ICD-10-CM

## 2015-07-29 DIAGNOSIS — I639 Cerebral infarction, unspecified: Secondary | ICD-10-CM | POA: Diagnosis not present

## 2015-07-29 NOTE — Progress Notes (Signed)
STROKE NEUROLOGY FOLLOW UP NOTE  NAME: [name] DOB: Sep 09, 1941  REASON FOR VISIT: stroke follow up HISTORY FROM: pt and daughter and chart  Today we had the pleasure of seeing [name] in follow-up at our Neurology Clinic. Pt was accompanied by daughter.   History Summary [name] is a 74 y.o. female with history of atrial fibrillation on aspirin 81 mg daily prior to admission, hypertension, a previous TIA and stroke, hyperlipidemia, diabetes mellitus, and vaginal bleeding was admitted on 05/10/15 for left arm weakness. [name] did not receive IV t-PA due to vaginal bleeding history. [name] actually was first admitted in 07/2014 for AMS and left LE weakness. MRI showed right ACA infarct. Symptoms resolved and [name] was considered anticoagulation. However, [name] was previously on coumadin but discontinued due to vaginal bleeding. Therefore, we recommend at that time that pt to discuss with OBGYN regarding potential hysterectomy and consider [facility] after the surgery. During the interval time, [name] was kept on ASA and did not start AC due to ongoing vaginal bleeding. On the current admission, [name] still has vaginal bleeding but MRI showed right MCA and right caudate infarct, still likely due to afib not on AC. Discussed with OBGYN and was told pt not candidate for hysterectomy but OK to start [facility]. [name] was put on heparin drip and then switched to eliquis. Her vaginal bleeding was controlled later by Megace. [name] still has left UE plegia and later sent to CIR.   Interval History During the interval time, the patient has been doing better. During the interval time, [name] was discharged from CIR to home, however, at home, family was not able to take care of her. [name] eventually decided to go to SNF for further care. Currently [name] is in SNF with PT/OT for her left UE weakness. [name] is still on eliquis and lipitor. [name] was recently received IUD on 07/17/2015 for vaginal bleeding treatment. As per pt and  daughter, her vaginal bleeding was controlled now and no more bleeding. Megace discontinued. Blood pressure 131/75. Her left upper extremity had some improvement with therapy but still has significant weakness.    REVIEW OF SYSTEMS: Full 14 system review of systems performed and notable only for those listed below and in HPI above, all others are negative:  Constitutional:   Cardiovascular:  Ear/Nose/Throat:   Skin:  Eyes:   Respiratory:  SOB, wheezing Gastroitestinal:   Genitourinary:  Hematology/Lymphatic:   Endocrine:  Musculoskeletal:   Allergy/Immunology:   Neurological:   Psychiatric:  Sleep:   The following represents the patient's updated allergies and side effects list: Allergies  Allergen Reactions  . Imitrex [Sumatriptan] Other (See Comments)    Unknown Reaction   . Mango Flavor     Nausea and vomiting  . Oysters [Shellfish Allergy]     Nausea and vomiting    The neurologically relevant items on the patient's problem list were reviewed on today's visit.  Neurologic Examination  A problem focused neurological exam (12 or more points of the single system neurologic examination, vital signs counts as 1 point, cranial nerves count for 8 points) was performed.  Blood pressure 131/75, pulse 76, height 5' (1.524 m), weight 279 lb 6.4 oz (126.735 kg), last menstrual period 03/31/2013.  General - obesity, well developed, in no apparent distress.  Ophthalmologic - Fundi not visualized due to eye movement.  Cardiovascular - irregularly irregular heart rate and rhythm.  Mental Status -  Level of arousal and orientation to time,  place, and person were intact. Language including expression, naming, repetition, comprehension was assessed and found intact. Fund of Knowledge was assessed and was intact.  Cranial Nerves II - XII - II - Visual field intact OU. III, IV, VI - Extraocular movements intact. V - Facial sensation intact bilaterally. VII - Facial movement intact  bilaterally. VIII - Hearing & vestibular intact bilaterally. X - Palate elevates symmetrically. XI - Chin turning & shoulder shrug intact bilaterally. XII - Tongue protrusion intact.  Motor Strength - The patient's strength was normal in all extremities except left upper extremity 2/5 proximal and 3/5 distally and pronator drift was absent.  Bulk was normal and fasciculations were absent.   Motor Tone - Muscle tone was assessed at the neck and appendages and was normal.  Reflexes - The patient's reflexes were 1+ in all extremities and [name] had no pathological reflexes.  Sensory - Light touch, temperature/pinprick were assessed and were normal.    Coordination - The patient had normal movements in the right hand and feet with no ataxia or dysmetria.  Tremor was absent.  Gait and Station - in wheelchair, gait not tested.  Data reviewed: I personally reviewed the images and agree with the radiology interpretations.  Dg Chest 2 View 05/10/2015  No active cardiopulmonary disease.   Ct Head Wo Contrast 05/10/2015  1. Degraded examination without definite acute intracranial process.  2. Similar findings of advanced microvascular ischemic disease.   MRI BRAIN:  05/10/2015  Acute nonhemorrhagic infarct right caudate. Acute nonhemorrhagic infarcts in a parasagittal distribution within the right frontal lobe and anterior aspect of the right parietal lobe. Remote anterior right frontal lobe/genu of the corpus callosum infarct. Remote small basal ganglia infarcts bilaterally. Moderate small vessel disease type changes. Partial opacification mastoid air cells greater on right. Paranasal sinus mucosal thickening. Expanded partially empty sella. This finding and slightly prominent peri optic spaces can be seen in this setting of pseudotumor cerebri. No flattening of the posterior aspect of the globes.   MRA HEAD  05/10/2015  High-grade stenosis of proximal right middle cerebral artery  branches just beyond the bifurcation. Decrease number of visualized right middle cerebral artery branches consistent with patient's acute infarct. Please see above for additional findings.   2-D echo  Left ventricle: The cavity size was normal. Systolic function wasnormal. The estimated ejection fraction was in the range of 55%to 60%. Wall motion was normal; there were no regional wallmotion abnormalities. Doppler parameters are consistent withabnormal left ventricular relaxation (grade 1 diastolicdysfunction).  Carotid Doppler - Bilateral: 1-39% ICA stenosis. Vertebral artery flow is antegrade.  EEG Normal drowsy electroencephalogram. There are no focal lateralizing or epileptiform features.  Component     Latest Ref Rng 05/11/2015  Cholesterol     0 - 200 mg/dL 207 (H)  Triglycerides     <150 mg/dL 75  HDL Cholesterol     >40 mg/dL 44  Total CHOL/HDL Ratio      4.7  VLDL     0 - 40 mg/dL 15  LDL (calc)     0 - 99 mg/dL 148 (H)  Hemoglobin A1C     4.8 - 5.6 % 6.1 (H)  Mean Plasma Glucose      128    Assessment: As you may recall, [name] is a 74 y.o. African American female with PMH of atrial fibrillation on aspirin 81 mg daily prior to admission, hypertension, a previous TIA and stroke, hyperlipidemia, diabetes mellitus, and vaginal bleeding was admitted on 05/10/15  for left arm weakness.[name] was put on an increase last August after a stroke, however, it was discontinued due to vaginal bleeding. On this admission, OB/GYN considered patient not candidate for hysterectomy, but put pt on megace and agreed with anticoagulation. Patient was put on Eliquis since then. Vaginal bleeding also under control, recently received IUD placement. [name] still has left upper extremity weakness and undergoing therapy in SNF.   Plan:  - continue eliquis and lipitor for stroke prevention - need aggressive PT/OT and self active exercise on the left arm - Follow up with your primary care physician for  stroke risk factor modification. Recommend maintain blood pressure goal <130/80, diabetes with hemoglobin A1c goal below 6.5% and lipids with LDL cholesterol goal below 70 mg/dL.  - follow up with OBGYN as scheduled - bleeding precautions - check BP and glucose in facility - RTC in 3 months.  No orders of the defined types were placed in this encounter.    Meds ordered this encounter  Medications  . VENTOLIN HFA 108 (90 BASE) MCG/ACT inhaler    Sig:   . furosemide (LASIX) 20 MG tablet    Sig:   . gabapentin (NEURONTIN) 100 MG capsule    Sig:   . ibuprofen (ADVIL,MOTRIN) 600 MG tablet    Sig:   . megestrol (MEGACE) 40 MG/ML suspension    Sig:   . KLOR-CON M20 20 MEQ tablet    Sig:   . KLOR-CON 10 10 MEQ tablet    Sig:   . oxybutynin (DITROPAN) 5 MG tablet    Sig: Take 5 mg by mouth 3 (three) times daily.  . diphenhydrAMINE (BENADRYL) 25 MG tablet    Sig: Take 25 mg by mouth every 6 (six) hours as needed.  . traMADol (ULTRAM) 50 MG tablet    Sig: Take by mouth every 6 (six) hours as needed.    There are no Patient Instructions on file for this visit.  Rosalin Hawking, MD PhD [facility] [address] [place], [place] [address] [phone]

## 2015-07-29 NOTE — Patient Instructions (Signed)
-   continue eliquis and lipitor for stroke prevention - need aggressive PT/OT - aggressive self active exercise on the left arm - Follow up with your primary care physician for stroke risk factor modification. Recommend maintain blood pressure goal <130/80, diabetes with hemoglobin A1c goal below 6.5% and lipids with LDL cholesterol goal below 70 mg/dL.  - follow up with OBGYN as scheduled - bleeding precautions - check BP and glucose in facility - follow up in 3 months.

## 2015-07-30 DIAGNOSIS — I69954 Hemiplegia and hemiparesis following unspecified cerebrovascular disease affecting left non-dominant side: Secondary | ICD-10-CM | POA: Diagnosis not present

## 2015-07-30 DIAGNOSIS — I639 Cerebral infarction, unspecified: Secondary | ICD-10-CM | POA: Diagnosis not present

## 2015-07-30 DIAGNOSIS — R2681 Unsteadiness on feet: Secondary | ICD-10-CM | POA: Diagnosis not present

## 2015-07-30 DIAGNOSIS — M6281 Muscle weakness (generalized): Secondary | ICD-10-CM | POA: Diagnosis not present

## 2015-07-30 DIAGNOSIS — R262 Difficulty in walking, not elsewhere classified: Secondary | ICD-10-CM | POA: Diagnosis not present

## 2015-07-31 ENCOUNTER — Encounter: Payer: Commercial Managed Care - HMO | Attending: Physical Medicine & Rehabilitation

## 2015-07-31 ENCOUNTER — Ambulatory Visit (HOSPITAL_BASED_OUTPATIENT_CLINIC_OR_DEPARTMENT_OTHER): Payer: Commercial Managed Care - HMO | Admitting: Physical Medicine & Rehabilitation

## 2015-07-31 ENCOUNTER — Encounter: Payer: Self-pay | Admitting: Physical Medicine & Rehabilitation

## 2015-07-31 VITALS — BP 202/92 | HR 83 | Resp 14

## 2015-07-31 DIAGNOSIS — M6281 Muscle weakness (generalized): Secondary | ICD-10-CM | POA: Diagnosis not present

## 2015-07-31 DIAGNOSIS — I63421 Cerebral infarction due to embolism of right anterior cerebral artery: Secondary | ICD-10-CM | POA: Diagnosis not present

## 2015-07-31 DIAGNOSIS — E785 Hyperlipidemia, unspecified: Secondary | ICD-10-CM | POA: Diagnosis not present

## 2015-07-31 DIAGNOSIS — M79642 Pain in left hand: Secondary | ICD-10-CM | POA: Diagnosis not present

## 2015-07-31 DIAGNOSIS — I69954 Hemiplegia and hemiparesis following unspecified cerebrovascular disease affecting left non-dominant side: Secondary | ICD-10-CM | POA: Diagnosis not present

## 2015-07-31 DIAGNOSIS — I10 Essential (primary) hypertension: Secondary | ICD-10-CM

## 2015-07-31 DIAGNOSIS — R262 Difficulty in walking, not elsewhere classified: Secondary | ICD-10-CM | POA: Diagnosis not present

## 2015-07-31 DIAGNOSIS — I69319 Unspecified symptoms and signs involving cognitive functions following cerebral infarction: Secondary | ICD-10-CM

## 2015-07-31 DIAGNOSIS — I6931 Cognitive deficits following cerebral infarction: Secondary | ICD-10-CM | POA: Diagnosis not present

## 2015-07-31 DIAGNOSIS — I639 Cerebral infarction, unspecified: Secondary | ICD-10-CM | POA: Diagnosis not present

## 2015-07-31 DIAGNOSIS — I69854 Hemiplegia and hemiparesis following other cerebrovascular disease affecting left non-dominant side: Secondary | ICD-10-CM

## 2015-07-31 DIAGNOSIS — R2681 Unsteadiness on feet: Secondary | ICD-10-CM | POA: Diagnosis not present

## 2015-07-31 DIAGNOSIS — M1990 Unspecified osteoarthritis, unspecified site: Secondary | ICD-10-CM | POA: Insufficient documentation

## 2015-07-31 DIAGNOSIS — E119 Type 2 diabetes mellitus without complications: Secondary | ICD-10-CM | POA: Diagnosis not present

## 2015-07-31 DIAGNOSIS — I69354 Hemiplegia and hemiparesis following cerebral infarction affecting left non-dominant side: Secondary | ICD-10-CM

## 2015-07-31 NOTE — Progress Notes (Signed)
Subjective:    Patient ID: [name], female    DOB: 02-13-1941, 74 y.o.   MRN: [id number] 74 year old right-handed female with history of atrial fibrillation, maintained on aspirin therapy; diabetes mellitus; hypertension; right ACA infarct in August 2015, had initially been placed on Eliquis, but discontinued due to vaginal bleeding.  She lives with her daughter, used a cane and a walker prior to admission.  Presented on May 10, 2015, with left-sided weakness.  MRI of the brain showed acute nonhemorrhagic infarct, right caudate, acute nonhemorrhagic infarct peri-sagittal distribution within the right frontal lobe and anterior aspect of the right parietal lobe  HPI  At [facility], OT, PT sometimes ispainful, Tramadol is helpful but pt sometimes doesn't get this but instead tries tylenol which is less helpful  Needs help with dressing bathing standing and toileting, clothing management  Doing some walkng with PT  Asking about what to do about hand swelling, Still has pain with range of motion during therapy  Patient thought she saw me 2 days ago, we discussed that this was over a month ago. Pain Inventory Average Pain 9 Pain Right Now 5 My pain is sharp and aching  In the last 24 hours, has pain interfered with the following? General activity 5 Relation with others 3 Enjoyment of life 5 What TIME of day is your pain at its worst? night Sleep (in general) Poor  Pain is worse with: some activites Pain improves with: therapy/exercise Relief from Meds: 3  Mobility walk with assistance how many minutes can you walk? 0 ability to climb steps?  yes do you drive?  no use a wheelchair needs help with transfers Do you have any goals in this area?  yes  Function disabled: date disabled . I need assistance with the following:  bathing and toileting  Neuro/Psych bladder control problems weakness tremor trouble walking depression  Prior Studies Any changes since  last visit?  no  Physicians involved in your care Any changes since last visit?  no   Family History  Problem Relation Age of Onset  . Arrhythmia Father     Atrial fib/Pacemaker  . Heart failure Mother   . Diabetes Mother   . Other Mother     fell and broke hip  . Diabetes Sister   . Lupus Daughter   . Mental illness Son   . ADD / ADHD Son   . Other Son     soft bones  . Diabetes Maternal Grandmother   . Stroke Paternal Grandmother   . Arthritis Paternal Grandfather   . Other Sister     MVA  . Hypertension Brother   . Diabetes Sister   . Hypertension Sister   . Colon cancer Neg Hx    Social History   Social History  . Marital Status: Widowed    Spouse Name: N/A  . Number of Children: 2  . Years of Education: N/A   Occupational History  . retired    Social History Main Topics  . Smoking status: Former Smoker -- 0.25 packs/day for 30 years    Types: Cigarettes  . Smokeless tobacco: Never Used  . Alcohol Use: No  . Drug Use: No  . Sexual Activity: Not Currently    Birth Control/ Protection: Post-menopausal     Comment: ablation   Other Topics Concern  . None   Social History Narrative   Past Surgical History  Procedure Laterality Date  . Appendectomy    . Ovary surgery    .  Cataract extraction w/phaco Left 01/16/2013    Procedure: CATARACT EXTRACTION PHACO AND INTRAOCULAR LENS PLACEMENT (IOC);  Surgeon: Elta Guadeloupe T. Gershon Crane, MD;  Location: [facility];  Service: Ophthalmology;  Laterality: Left;  CDE:14.37  . Endometrial biopsy  04/03/2013       . Hysteroscopy w/d&c N/A 04/24/2013    Procedure: SUCTION DILATATION AND CURETTAGE /HYSTEROSCOPY;  Surgeon: Jonnie Kind, MD;  Location: [facility];  Service: Gynecology;  Laterality: N/A;  . Knee arthroscopy with medial menisectomy Left 07/26/2013    Procedure: KNEE ARTHROSCOPY WITH PARTIAL MEDIAL MENISECTOMY;  Surgeon: Sanjuana Kava, MD;  Location: [facility];  Service: Orthopedics;  Laterality: Left;  . Endometrial ablation      . Colonoscopy  2009    Dr. Wilford Corner: 4 hyperplastic polyps removed. Small internal hemorrhoids. Recommended 5 year follow-up colonoscopy.  . Colonoscopy N/A 02/24/2015    Procedure: COLONOSCOPY;  Surgeon: Danie Binder, MD;  Location: [facility];  Service: Endoscopy;  Laterality: N/A;  1030  . Hysteroscopy w/d&c N/A 04/02/2015    Procedure: UTERINE CURETTAGE, HYSTEROSCOPY;  Surgeon: Florian Buff, MD;  Location: [facility];  Service: Gynecology;  Laterality: N/A;   Past Medical History  Diagnosis Date  . Diabetes mellitus   . Asthma   . Chronic knee pain   . Back pain, chronic   . Hypertension   . Vertigo   . Hyperlipidemia   . TIA (transient ischemic attack)   . Anemia   . HOH (hard of hearing)   . Arthritis   . Obesity   . PMB (postmenopausal bleeding) 11/26/2014  . Urge incontinence 11/26/2014  . Thickened endometrium 01/15/2015  . Dysrhythmia   . Stroke     Mini Stroke   BP 202/92 mmHg  Pulse 83  Resp 14  SpO2 97%  LMP 03/31/2013  Opioid Risk Score:   Fall Risk Score:  `1  Depression screen PHQ 2/9  Depression screen PHQ 2/9 06/27/2015  Decreased Interest 1  Down, Depressed, Hopeless 0  PHQ - 2 Score 1  Altered sleeping 3  Tired, decreased energy 2  Change in appetite 0  Feeling bad or failure about yourself  0  Trouble concentrating 3  Moving slowly or fidgety/restless 0  Suicidal thoughts 0  PHQ-9 Score 9     Review of Systems  Genitourinary: Positive for dysuria.  Musculoskeletal: Positive for gait problem.  Neurological: Positive for tremors and weakness.  Psychiatric/Behavioral: Positive for dysphoric mood.  All other systems reviewed and are negative.      Objective:   Physical Exam  Constitutional: She is oriented to person, place, and time. She appears well-developed and well-nourished.  HENT:  Head: Normocephalic and atraumatic.  Right Ear: External ear normal.  Left Ear: External ear normal.  Hard of hearing  Eyes:  Conjunctivae and EOM are normal. Pupils are equal, round, and reactive to light.  Neck: Normal range of motion.  Neurological: She is alert and oriented to person, place, and time.  Psychiatric: She has a normal mood and affect.  Nursing note and vitals reviewed.   Visual fields intact to confrontation testing Motor strength is normal in the right upper and right lower limb Left upper extremity 2 months at the biceps 3 minus of deltoid, trace finger flexion and extension 2+ edema dorsum of the left hand. Mild to moderate tenderness to palpation along the MCPs. No erythema. No pain with shoulder range of motion. Tone is reduced in the left upper extremity, normal in the  left lower extremity, normal on the right side      Assessment & Plan:  1.  Right frontal parietal as well as right caudate infarcts causing left hemiparesis upper extremity greater than lower extremity Continue PT and OT at the skilled nursing facility. She is having some pain with range of motion in the left upper extremity. Recommend tramadol 50 mg prior to PT or OT sessions  2. Left hand dependent edema I do not think she has any further evidence of reflex sympathetic dystrophy. Continue elevation, retrograde massage may use Biofreeze given that she has pain as well, may use ice 15-20 minutes every 2 hours as needed for left hand pain and swelling  3. Hypertension according to aid from nursing facility she has anxiety in doctor's offices. I asked them to repeat blood pressure when she returns to her skilled nursing facility and contact the attending physician if it is still elevated  Discussed the recommendations with the patient as well as the aide from the skilled nursing facility. I wrote orders for the skilled nursing facility and explain them to the patient and the caregiver.  4. Cognitive deficit post stroke, expect some further improvement over the course of  12 months  Over half of the 25 min visit was spent  counseling and coordinating care.

## 2015-07-31 NOTE — Patient Instructions (Signed)
Please see enclosed order sheet

## 2015-08-01 DIAGNOSIS — I69954 Hemiplegia and hemiparesis following unspecified cerebrovascular disease affecting left non-dominant side: Secondary | ICD-10-CM | POA: Diagnosis not present

## 2015-08-01 DIAGNOSIS — M6281 Muscle weakness (generalized): Secondary | ICD-10-CM | POA: Diagnosis not present

## 2015-08-01 DIAGNOSIS — I639 Cerebral infarction, unspecified: Secondary | ICD-10-CM | POA: Diagnosis not present

## 2015-08-01 DIAGNOSIS — R262 Difficulty in walking, not elsewhere classified: Secondary | ICD-10-CM | POA: Diagnosis not present

## 2015-08-01 DIAGNOSIS — R2681 Unsteadiness on feet: Secondary | ICD-10-CM | POA: Diagnosis not present

## 2015-08-04 DIAGNOSIS — R0602 Shortness of breath: Secondary | ICD-10-CM | POA: Diagnosis not present

## 2015-08-04 DIAGNOSIS — D509 Iron deficiency anemia, unspecified: Secondary | ICD-10-CM | POA: Diagnosis not present

## 2015-08-04 DIAGNOSIS — Z79899 Other long term (current) drug therapy: Secondary | ICD-10-CM | POA: Diagnosis not present

## 2015-08-04 DIAGNOSIS — G8190 Hemiplegia, unspecified affecting unspecified side: Secondary | ICD-10-CM | POA: Diagnosis not present

## 2015-08-04 DIAGNOSIS — K5901 Slow transit constipation: Secondary | ICD-10-CM | POA: Diagnosis not present

## 2015-08-04 DIAGNOSIS — R262 Difficulty in walking, not elsewhere classified: Secondary | ICD-10-CM | POA: Diagnosis not present

## 2015-08-04 DIAGNOSIS — M6281 Muscle weakness (generalized): Secondary | ICD-10-CM | POA: Diagnosis not present

## 2015-08-04 DIAGNOSIS — I69954 Hemiplegia and hemiparesis following unspecified cerebrovascular disease affecting left non-dominant side: Secondary | ICD-10-CM | POA: Diagnosis not present

## 2015-08-04 DIAGNOSIS — D649 Anemia, unspecified: Secondary | ICD-10-CM | POA: Diagnosis not present

## 2015-08-04 DIAGNOSIS — E08319 Diabetes mellitus due to underlying condition with unspecified diabetic retinopathy without macular edema: Secondary | ICD-10-CM | POA: Diagnosis not present

## 2015-08-04 DIAGNOSIS — R2681 Unsteadiness on feet: Secondary | ICD-10-CM | POA: Diagnosis not present

## 2015-08-04 DIAGNOSIS — E785 Hyperlipidemia, unspecified: Secondary | ICD-10-CM | POA: Diagnosis not present

## 2015-08-04 DIAGNOSIS — I639 Cerebral infarction, unspecified: Secondary | ICD-10-CM | POA: Diagnosis not present

## 2015-08-05 DIAGNOSIS — I69954 Hemiplegia and hemiparesis following unspecified cerebrovascular disease affecting left non-dominant side: Secondary | ICD-10-CM | POA: Diagnosis not present

## 2015-08-05 DIAGNOSIS — R262 Difficulty in walking, not elsewhere classified: Secondary | ICD-10-CM | POA: Diagnosis not present

## 2015-08-05 DIAGNOSIS — I639 Cerebral infarction, unspecified: Secondary | ICD-10-CM | POA: Diagnosis not present

## 2015-08-05 DIAGNOSIS — R2681 Unsteadiness on feet: Secondary | ICD-10-CM | POA: Diagnosis not present

## 2015-08-05 DIAGNOSIS — M6281 Muscle weakness (generalized): Secondary | ICD-10-CM | POA: Diagnosis not present

## 2015-08-06 DIAGNOSIS — R262 Difficulty in walking, not elsewhere classified: Secondary | ICD-10-CM | POA: Diagnosis not present

## 2015-08-06 DIAGNOSIS — K5901 Slow transit constipation: Secondary | ICD-10-CM | POA: Diagnosis not present

## 2015-08-06 DIAGNOSIS — D649 Anemia, unspecified: Secondary | ICD-10-CM | POA: Diagnosis not present

## 2015-08-06 DIAGNOSIS — I69954 Hemiplegia and hemiparesis following unspecified cerebrovascular disease affecting left non-dominant side: Secondary | ICD-10-CM | POA: Diagnosis not present

## 2015-08-06 DIAGNOSIS — R2681 Unsteadiness on feet: Secondary | ICD-10-CM | POA: Diagnosis not present

## 2015-08-06 DIAGNOSIS — I639 Cerebral infarction, unspecified: Secondary | ICD-10-CM | POA: Diagnosis not present

## 2015-08-06 DIAGNOSIS — M6281 Muscle weakness (generalized): Secondary | ICD-10-CM | POA: Diagnosis not present

## 2015-08-06 DIAGNOSIS — R0602 Shortness of breath: Secondary | ICD-10-CM | POA: Diagnosis not present

## 2015-08-06 DIAGNOSIS — G8190 Hemiplegia, unspecified affecting unspecified side: Secondary | ICD-10-CM | POA: Diagnosis not present

## 2015-08-07 DIAGNOSIS — R2681 Unsteadiness on feet: Secondary | ICD-10-CM | POA: Diagnosis not present

## 2015-08-07 DIAGNOSIS — R262 Difficulty in walking, not elsewhere classified: Secondary | ICD-10-CM | POA: Diagnosis not present

## 2015-08-07 DIAGNOSIS — I639 Cerebral infarction, unspecified: Secondary | ICD-10-CM | POA: Diagnosis not present

## 2015-08-07 DIAGNOSIS — M6281 Muscle weakness (generalized): Secondary | ICD-10-CM | POA: Diagnosis not present

## 2015-08-07 DIAGNOSIS — I69954 Hemiplegia and hemiparesis following unspecified cerebrovascular disease affecting left non-dominant side: Secondary | ICD-10-CM | POA: Diagnosis not present

## 2015-08-08 DIAGNOSIS — M6281 Muscle weakness (generalized): Secondary | ICD-10-CM | POA: Diagnosis not present

## 2015-08-08 DIAGNOSIS — I69954 Hemiplegia and hemiparesis following unspecified cerebrovascular disease affecting left non-dominant side: Secondary | ICD-10-CM | POA: Diagnosis not present

## 2015-08-08 DIAGNOSIS — I639 Cerebral infarction, unspecified: Secondary | ICD-10-CM | POA: Diagnosis not present

## 2015-08-08 DIAGNOSIS — R262 Difficulty in walking, not elsewhere classified: Secondary | ICD-10-CM | POA: Diagnosis not present

## 2015-08-08 DIAGNOSIS — R2681 Unsteadiness on feet: Secondary | ICD-10-CM | POA: Diagnosis not present

## 2015-08-08 DIAGNOSIS — R32 Unspecified urinary incontinence: Secondary | ICD-10-CM | POA: Diagnosis not present

## 2015-08-09 DIAGNOSIS — I639 Cerebral infarction, unspecified: Secondary | ICD-10-CM | POA: Diagnosis not present

## 2015-08-09 DIAGNOSIS — R2681 Unsteadiness on feet: Secondary | ICD-10-CM | POA: Diagnosis not present

## 2015-08-09 DIAGNOSIS — M6281 Muscle weakness (generalized): Secondary | ICD-10-CM | POA: Diagnosis not present

## 2015-08-09 DIAGNOSIS — I69954 Hemiplegia and hemiparesis following unspecified cerebrovascular disease affecting left non-dominant side: Secondary | ICD-10-CM | POA: Diagnosis not present

## 2015-08-09 DIAGNOSIS — R262 Difficulty in walking, not elsewhere classified: Secondary | ICD-10-CM | POA: Diagnosis not present

## 2015-08-11 DIAGNOSIS — I69954 Hemiplegia and hemiparesis following unspecified cerebrovascular disease affecting left non-dominant side: Secondary | ICD-10-CM | POA: Diagnosis not present

## 2015-08-11 DIAGNOSIS — R32 Unspecified urinary incontinence: Secondary | ICD-10-CM | POA: Diagnosis not present

## 2015-08-11 DIAGNOSIS — M6281 Muscle weakness (generalized): Secondary | ICD-10-CM | POA: Diagnosis not present

## 2015-08-11 DIAGNOSIS — R2681 Unsteadiness on feet: Secondary | ICD-10-CM | POA: Diagnosis not present

## 2015-08-11 DIAGNOSIS — Z79899 Other long term (current) drug therapy: Secondary | ICD-10-CM | POA: Diagnosis not present

## 2015-08-11 DIAGNOSIS — D509 Iron deficiency anemia, unspecified: Secondary | ICD-10-CM | POA: Diagnosis not present

## 2015-08-11 DIAGNOSIS — I639 Cerebral infarction, unspecified: Secondary | ICD-10-CM | POA: Diagnosis not present

## 2015-08-11 DIAGNOSIS — E08319 Diabetes mellitus due to underlying condition with unspecified diabetic retinopathy without macular edema: Secondary | ICD-10-CM | POA: Diagnosis not present

## 2015-08-11 DIAGNOSIS — R262 Difficulty in walking, not elsewhere classified: Secondary | ICD-10-CM | POA: Diagnosis not present

## 2015-08-11 DIAGNOSIS — R0602 Shortness of breath: Secondary | ICD-10-CM | POA: Diagnosis not present

## 2015-08-11 DIAGNOSIS — D649 Anemia, unspecified: Secondary | ICD-10-CM | POA: Diagnosis not present

## 2015-08-12 DIAGNOSIS — I69954 Hemiplegia and hemiparesis following unspecified cerebrovascular disease affecting left non-dominant side: Secondary | ICD-10-CM | POA: Diagnosis not present

## 2015-08-12 DIAGNOSIS — R262 Difficulty in walking, not elsewhere classified: Secondary | ICD-10-CM | POA: Diagnosis not present

## 2015-08-12 DIAGNOSIS — R2681 Unsteadiness on feet: Secondary | ICD-10-CM | POA: Diagnosis not present

## 2015-08-12 DIAGNOSIS — I639 Cerebral infarction, unspecified: Secondary | ICD-10-CM | POA: Diagnosis not present

## 2015-08-12 DIAGNOSIS — M6281 Muscle weakness (generalized): Secondary | ICD-10-CM | POA: Diagnosis not present

## 2015-08-13 DIAGNOSIS — R2681 Unsteadiness on feet: Secondary | ICD-10-CM | POA: Diagnosis not present

## 2015-08-13 DIAGNOSIS — I639 Cerebral infarction, unspecified: Secondary | ICD-10-CM | POA: Diagnosis not present

## 2015-08-13 DIAGNOSIS — R262 Difficulty in walking, not elsewhere classified: Secondary | ICD-10-CM | POA: Diagnosis not present

## 2015-08-13 DIAGNOSIS — M6281 Muscle weakness (generalized): Secondary | ICD-10-CM | POA: Diagnosis not present

## 2015-08-13 DIAGNOSIS — I69954 Hemiplegia and hemiparesis following unspecified cerebrovascular disease affecting left non-dominant side: Secondary | ICD-10-CM | POA: Diagnosis not present

## 2015-08-14 DIAGNOSIS — R2681 Unsteadiness on feet: Secondary | ICD-10-CM | POA: Diagnosis not present

## 2015-08-14 DIAGNOSIS — I639 Cerebral infarction, unspecified: Secondary | ICD-10-CM | POA: Diagnosis not present

## 2015-08-14 DIAGNOSIS — I69954 Hemiplegia and hemiparesis following unspecified cerebrovascular disease affecting left non-dominant side: Secondary | ICD-10-CM | POA: Diagnosis not present

## 2015-08-14 DIAGNOSIS — M6281 Muscle weakness (generalized): Secondary | ICD-10-CM | POA: Diagnosis not present

## 2015-08-14 DIAGNOSIS — R262 Difficulty in walking, not elsewhere classified: Secondary | ICD-10-CM | POA: Diagnosis not present

## 2015-08-15 DIAGNOSIS — I69954 Hemiplegia and hemiparesis following unspecified cerebrovascular disease affecting left non-dominant side: Secondary | ICD-10-CM | POA: Diagnosis not present

## 2015-08-15 DIAGNOSIS — R262 Difficulty in walking, not elsewhere classified: Secondary | ICD-10-CM | POA: Diagnosis not present

## 2015-08-15 DIAGNOSIS — M6281 Muscle weakness (generalized): Secondary | ICD-10-CM | POA: Diagnosis not present

## 2015-08-15 DIAGNOSIS — I639 Cerebral infarction, unspecified: Secondary | ICD-10-CM | POA: Diagnosis not present

## 2015-08-15 DIAGNOSIS — R2681 Unsteadiness on feet: Secondary | ICD-10-CM | POA: Diagnosis not present

## 2015-08-16 DIAGNOSIS — R0602 Shortness of breath: Secondary | ICD-10-CM | POA: Diagnosis not present

## 2015-08-16 DIAGNOSIS — R32 Unspecified urinary incontinence: Secondary | ICD-10-CM | POA: Diagnosis not present

## 2015-08-17 DIAGNOSIS — R32 Unspecified urinary incontinence: Secondary | ICD-10-CM | POA: Diagnosis not present

## 2015-08-17 DIAGNOSIS — R0602 Shortness of breath: Secondary | ICD-10-CM | POA: Diagnosis not present

## 2015-08-18 DIAGNOSIS — D649 Anemia, unspecified: Secondary | ICD-10-CM | POA: Diagnosis not present

## 2015-08-18 DIAGNOSIS — I639 Cerebral infarction, unspecified: Secondary | ICD-10-CM | POA: Diagnosis not present

## 2015-08-18 DIAGNOSIS — R0602 Shortness of breath: Secondary | ICD-10-CM | POA: Diagnosis not present

## 2015-08-18 DIAGNOSIS — R601 Generalized edema: Secondary | ICD-10-CM | POA: Diagnosis not present

## 2015-08-18 DIAGNOSIS — I69954 Hemiplegia and hemiparesis following unspecified cerebrovascular disease affecting left non-dominant side: Secondary | ICD-10-CM | POA: Diagnosis not present

## 2015-08-18 DIAGNOSIS — M6281 Muscle weakness (generalized): Secondary | ICD-10-CM | POA: Diagnosis not present

## 2015-08-18 DIAGNOSIS — R262 Difficulty in walking, not elsewhere classified: Secondary | ICD-10-CM | POA: Diagnosis not present

## 2015-08-18 DIAGNOSIS — E08319 Diabetes mellitus due to underlying condition with unspecified diabetic retinopathy without macular edema: Secondary | ICD-10-CM | POA: Diagnosis not present

## 2015-08-18 DIAGNOSIS — R32 Unspecified urinary incontinence: Secondary | ICD-10-CM | POA: Diagnosis not present

## 2015-08-18 DIAGNOSIS — K5901 Slow transit constipation: Secondary | ICD-10-CM | POA: Diagnosis not present

## 2015-08-18 DIAGNOSIS — D509 Iron deficiency anemia, unspecified: Secondary | ICD-10-CM | POA: Diagnosis not present

## 2015-08-18 DIAGNOSIS — R2681 Unsteadiness on feet: Secondary | ICD-10-CM | POA: Diagnosis not present

## 2015-08-18 DIAGNOSIS — Z79899 Other long term (current) drug therapy: Secondary | ICD-10-CM | POA: Diagnosis not present

## 2015-08-19 DIAGNOSIS — I69954 Hemiplegia and hemiparesis following unspecified cerebrovascular disease affecting left non-dominant side: Secondary | ICD-10-CM | POA: Diagnosis not present

## 2015-08-19 DIAGNOSIS — I639 Cerebral infarction, unspecified: Secondary | ICD-10-CM | POA: Diagnosis not present

## 2015-08-19 DIAGNOSIS — R2681 Unsteadiness on feet: Secondary | ICD-10-CM | POA: Diagnosis not present

## 2015-08-19 DIAGNOSIS — R262 Difficulty in walking, not elsewhere classified: Secondary | ICD-10-CM | POA: Diagnosis not present

## 2015-08-19 DIAGNOSIS — M6281 Muscle weakness (generalized): Secondary | ICD-10-CM | POA: Diagnosis not present

## 2015-08-20 DIAGNOSIS — R601 Generalized edema: Secondary | ICD-10-CM | POA: Diagnosis not present

## 2015-08-20 DIAGNOSIS — R32 Unspecified urinary incontinence: Secondary | ICD-10-CM | POA: Diagnosis not present

## 2015-08-20 DIAGNOSIS — I69954 Hemiplegia and hemiparesis following unspecified cerebrovascular disease affecting left non-dominant side: Secondary | ICD-10-CM | POA: Diagnosis not present

## 2015-08-20 DIAGNOSIS — I639 Cerebral infarction, unspecified: Secondary | ICD-10-CM | POA: Diagnosis not present

## 2015-08-20 DIAGNOSIS — R262 Difficulty in walking, not elsewhere classified: Secondary | ICD-10-CM | POA: Diagnosis not present

## 2015-08-20 DIAGNOSIS — Z79899 Other long term (current) drug therapy: Secondary | ICD-10-CM | POA: Diagnosis not present

## 2015-08-20 DIAGNOSIS — R2681 Unsteadiness on feet: Secondary | ICD-10-CM | POA: Diagnosis not present

## 2015-08-20 DIAGNOSIS — K5901 Slow transit constipation: Secondary | ICD-10-CM | POA: Diagnosis not present

## 2015-08-20 DIAGNOSIS — M6281 Muscle weakness (generalized): Secondary | ICD-10-CM | POA: Diagnosis not present

## 2015-08-20 DIAGNOSIS — E08319 Diabetes mellitus due to underlying condition with unspecified diabetic retinopathy without macular edema: Secondary | ICD-10-CM | POA: Diagnosis not present

## 2015-08-20 DIAGNOSIS — R0602 Shortness of breath: Secondary | ICD-10-CM | POA: Diagnosis not present

## 2015-08-21 DIAGNOSIS — R262 Difficulty in walking, not elsewhere classified: Secondary | ICD-10-CM | POA: Diagnosis not present

## 2015-08-21 DIAGNOSIS — R2681 Unsteadiness on feet: Secondary | ICD-10-CM | POA: Diagnosis not present

## 2015-08-21 DIAGNOSIS — I639 Cerebral infarction, unspecified: Secondary | ICD-10-CM | POA: Diagnosis not present

## 2015-08-21 DIAGNOSIS — I69954 Hemiplegia and hemiparesis following unspecified cerebrovascular disease affecting left non-dominant side: Secondary | ICD-10-CM | POA: Diagnosis not present

## 2015-08-21 DIAGNOSIS — M6281 Muscle weakness (generalized): Secondary | ICD-10-CM | POA: Diagnosis not present

## 2015-08-22 DIAGNOSIS — R2681 Unsteadiness on feet: Secondary | ICD-10-CM | POA: Diagnosis not present

## 2015-08-22 DIAGNOSIS — R262 Difficulty in walking, not elsewhere classified: Secondary | ICD-10-CM | POA: Diagnosis not present

## 2015-08-22 DIAGNOSIS — I69954 Hemiplegia and hemiparesis following unspecified cerebrovascular disease affecting left non-dominant side: Secondary | ICD-10-CM | POA: Diagnosis not present

## 2015-08-22 DIAGNOSIS — I639 Cerebral infarction, unspecified: Secondary | ICD-10-CM | POA: Diagnosis not present

## 2015-08-22 DIAGNOSIS — M6281 Muscle weakness (generalized): Secondary | ICD-10-CM | POA: Diagnosis not present

## 2015-08-25 DIAGNOSIS — N189 Chronic kidney disease, unspecified: Secondary | ICD-10-CM | POA: Diagnosis not present

## 2015-08-25 DIAGNOSIS — R2681 Unsteadiness on feet: Secondary | ICD-10-CM | POA: Diagnosis not present

## 2015-08-25 DIAGNOSIS — I69954 Hemiplegia and hemiparesis following unspecified cerebrovascular disease affecting left non-dominant side: Secondary | ICD-10-CM | POA: Diagnosis not present

## 2015-08-25 DIAGNOSIS — D649 Anemia, unspecified: Secondary | ICD-10-CM | POA: Diagnosis not present

## 2015-08-25 DIAGNOSIS — R262 Difficulty in walking, not elsewhere classified: Secondary | ICD-10-CM | POA: Diagnosis not present

## 2015-08-25 DIAGNOSIS — I639 Cerebral infarction, unspecified: Secondary | ICD-10-CM | POA: Diagnosis not present

## 2015-08-25 DIAGNOSIS — R0602 Shortness of breath: Secondary | ICD-10-CM | POA: Diagnosis not present

## 2015-08-25 DIAGNOSIS — Z79899 Other long term (current) drug therapy: Secondary | ICD-10-CM | POA: Diagnosis not present

## 2015-08-25 DIAGNOSIS — M6281 Muscle weakness (generalized): Secondary | ICD-10-CM | POA: Diagnosis not present

## 2015-08-25 DIAGNOSIS — R32 Unspecified urinary incontinence: Secondary | ICD-10-CM | POA: Diagnosis not present

## 2015-08-25 DIAGNOSIS — E08319 Diabetes mellitus due to underlying condition with unspecified diabetic retinopathy without macular edema: Secondary | ICD-10-CM | POA: Diagnosis not present

## 2015-08-25 DIAGNOSIS — D509 Iron deficiency anemia, unspecified: Secondary | ICD-10-CM | POA: Diagnosis not present

## 2015-08-26 DIAGNOSIS — I69954 Hemiplegia and hemiparesis following unspecified cerebrovascular disease affecting left non-dominant side: Secondary | ICD-10-CM | POA: Diagnosis not present

## 2015-08-26 DIAGNOSIS — R262 Difficulty in walking, not elsewhere classified: Secondary | ICD-10-CM | POA: Diagnosis not present

## 2015-08-26 DIAGNOSIS — R2681 Unsteadiness on feet: Secondary | ICD-10-CM | POA: Diagnosis not present

## 2015-08-26 DIAGNOSIS — M6281 Muscle weakness (generalized): Secondary | ICD-10-CM | POA: Diagnosis not present

## 2015-08-26 DIAGNOSIS — I639 Cerebral infarction, unspecified: Secondary | ICD-10-CM | POA: Diagnosis not present

## 2015-08-27 DIAGNOSIS — R2681 Unsteadiness on feet: Secondary | ICD-10-CM | POA: Diagnosis not present

## 2015-08-27 DIAGNOSIS — M6281 Muscle weakness (generalized): Secondary | ICD-10-CM | POA: Diagnosis not present

## 2015-08-27 DIAGNOSIS — I69954 Hemiplegia and hemiparesis following unspecified cerebrovascular disease affecting left non-dominant side: Secondary | ICD-10-CM | POA: Diagnosis not present

## 2015-08-27 DIAGNOSIS — I639 Cerebral infarction, unspecified: Secondary | ICD-10-CM | POA: Diagnosis not present

## 2015-08-27 DIAGNOSIS — R262 Difficulty in walking, not elsewhere classified: Secondary | ICD-10-CM | POA: Diagnosis not present

## 2015-08-28 DIAGNOSIS — R262 Difficulty in walking, not elsewhere classified: Secondary | ICD-10-CM | POA: Diagnosis not present

## 2015-08-28 DIAGNOSIS — I639 Cerebral infarction, unspecified: Secondary | ICD-10-CM | POA: Diagnosis not present

## 2015-08-28 DIAGNOSIS — R2681 Unsteadiness on feet: Secondary | ICD-10-CM | POA: Diagnosis not present

## 2015-08-28 DIAGNOSIS — I69954 Hemiplegia and hemiparesis following unspecified cerebrovascular disease affecting left non-dominant side: Secondary | ICD-10-CM | POA: Diagnosis not present

## 2015-08-28 DIAGNOSIS — M6281 Muscle weakness (generalized): Secondary | ICD-10-CM | POA: Diagnosis not present

## 2015-08-29 DIAGNOSIS — M79604 Pain in right leg: Secondary | ICD-10-CM | POA: Diagnosis not present

## 2015-08-29 DIAGNOSIS — I639 Cerebral infarction, unspecified: Secondary | ICD-10-CM | POA: Diagnosis not present

## 2015-08-29 DIAGNOSIS — I69954 Hemiplegia and hemiparesis following unspecified cerebrovascular disease affecting left non-dominant side: Secondary | ICD-10-CM | POA: Diagnosis not present

## 2015-08-29 DIAGNOSIS — M6281 Muscle weakness (generalized): Secondary | ICD-10-CM | POA: Diagnosis not present

## 2015-08-29 DIAGNOSIS — R262 Difficulty in walking, not elsewhere classified: Secondary | ICD-10-CM | POA: Diagnosis not present

## 2015-08-29 DIAGNOSIS — R601 Generalized edema: Secondary | ICD-10-CM | POA: Diagnosis not present

## 2015-08-29 DIAGNOSIS — R2681 Unsteadiness on feet: Secondary | ICD-10-CM | POA: Diagnosis not present

## 2015-08-30 DIAGNOSIS — R601 Generalized edema: Secondary | ICD-10-CM | POA: Diagnosis not present

## 2015-08-30 DIAGNOSIS — M79604 Pain in right leg: Secondary | ICD-10-CM | POA: Diagnosis not present

## 2015-08-30 DIAGNOSIS — R600 Localized edema: Secondary | ICD-10-CM | POA: Diagnosis not present

## 2015-09-01 DIAGNOSIS — M6281 Muscle weakness (generalized): Secondary | ICD-10-CM | POA: Diagnosis not present

## 2015-09-01 DIAGNOSIS — Z79899 Other long term (current) drug therapy: Secondary | ICD-10-CM | POA: Diagnosis not present

## 2015-09-01 DIAGNOSIS — D649 Anemia, unspecified: Secondary | ICD-10-CM | POA: Diagnosis not present

## 2015-09-01 DIAGNOSIS — N189 Chronic kidney disease, unspecified: Secondary | ICD-10-CM | POA: Diagnosis not present

## 2015-09-01 DIAGNOSIS — I639 Cerebral infarction, unspecified: Secondary | ICD-10-CM | POA: Diagnosis not present

## 2015-09-01 DIAGNOSIS — R2681 Unsteadiness on feet: Secondary | ICD-10-CM | POA: Diagnosis not present

## 2015-09-01 DIAGNOSIS — I69954 Hemiplegia and hemiparesis following unspecified cerebrovascular disease affecting left non-dominant side: Secondary | ICD-10-CM | POA: Diagnosis not present

## 2015-09-01 DIAGNOSIS — K5901 Slow transit constipation: Secondary | ICD-10-CM | POA: Diagnosis not present

## 2015-09-01 DIAGNOSIS — E08319 Diabetes mellitus due to underlying condition with unspecified diabetic retinopathy without macular edema: Secondary | ICD-10-CM | POA: Diagnosis not present

## 2015-09-01 DIAGNOSIS — D509 Iron deficiency anemia, unspecified: Secondary | ICD-10-CM | POA: Diagnosis not present

## 2015-09-01 DIAGNOSIS — R0602 Shortness of breath: Secondary | ICD-10-CM | POA: Diagnosis not present

## 2015-09-01 DIAGNOSIS — R601 Generalized edema: Secondary | ICD-10-CM | POA: Diagnosis not present

## 2015-09-01 DIAGNOSIS — R262 Difficulty in walking, not elsewhere classified: Secondary | ICD-10-CM | POA: Diagnosis not present

## 2015-09-02 ENCOUNTER — Ambulatory Visit (HOSPITAL_BASED_OUTPATIENT_CLINIC_OR_DEPARTMENT_OTHER): Payer: Commercial Managed Care - HMO | Admitting: Physical Medicine & Rehabilitation

## 2015-09-02 ENCOUNTER — Encounter: Payer: Self-pay | Admitting: Physical Medicine & Rehabilitation

## 2015-09-02 ENCOUNTER — Encounter: Payer: Commercial Managed Care - HMO | Attending: Physical Medicine & Rehabilitation

## 2015-09-02 VITALS — BP 171/95 | HR 116

## 2015-09-02 DIAGNOSIS — E119 Type 2 diabetes mellitus without complications: Secondary | ICD-10-CM | POA: Diagnosis not present

## 2015-09-02 DIAGNOSIS — I69854 Hemiplegia and hemiparesis following other cerebrovascular disease affecting left non-dominant side: Secondary | ICD-10-CM | POA: Insufficient documentation

## 2015-09-02 DIAGNOSIS — I10 Essential (primary) hypertension: Secondary | ICD-10-CM | POA: Diagnosis not present

## 2015-09-02 DIAGNOSIS — I69354 Hemiplegia and hemiparesis following cerebral infarction affecting left non-dominant side: Secondary | ICD-10-CM

## 2015-09-02 DIAGNOSIS — M79642 Pain in left hand: Secondary | ICD-10-CM | POA: Diagnosis not present

## 2015-09-02 DIAGNOSIS — E785 Hyperlipidemia, unspecified: Secondary | ICD-10-CM | POA: Diagnosis not present

## 2015-09-02 DIAGNOSIS — I69954 Hemiplegia and hemiparesis following unspecified cerebrovascular disease affecting left non-dominant side: Secondary | ICD-10-CM | POA: Diagnosis not present

## 2015-09-02 DIAGNOSIS — R2681 Unsteadiness on feet: Secondary | ICD-10-CM | POA: Diagnosis not present

## 2015-09-02 DIAGNOSIS — M6281 Muscle weakness (generalized): Secondary | ICD-10-CM | POA: Diagnosis not present

## 2015-09-02 DIAGNOSIS — M1990 Unspecified osteoarthritis, unspecified site: Secondary | ICD-10-CM | POA: Diagnosis not present

## 2015-09-02 DIAGNOSIS — I639 Cerebral infarction, unspecified: Secondary | ICD-10-CM | POA: Diagnosis not present

## 2015-09-02 DIAGNOSIS — R262 Difficulty in walking, not elsewhere classified: Secondary | ICD-10-CM | POA: Diagnosis not present

## 2015-09-02 NOTE — Patient Instructions (Signed)
Botox injection next visit to reduce spasticity in Left biceps

## 2015-09-02 NOTE — Progress Notes (Signed)
Subjective:    Patient ID: [name], female    DOB: 10-Jan-1941, 74 y.o.   MRN: [id number]  HPI  74 year old female with a right frontal, parietal and right caudate infarct onset  05/10/2015. She has completed inpatient intensive rehabilitation [facility] and was discharged to a [facility] skilled nursing facility in [place].   She continues to require 24-hour care including bathing and dressing as well as for transfers. She is mainly in a wheelchair with a left arm support hemi-lap tray  Can don her own shirt Left hand swelling improving  Pain Inventory Average Pain 9 Pain Right Now 0 My pain is aching  In the last 24 hours, has pain interfered with the following? General activity na Relation with others na Enjoyment of life na What TIME of day is your pain at its worst? night Sleep (in general) Poor  Pain is worse with: standing and some activites Pain improves with: rest and medication Relief from Meds: 8  Mobility use a cane ability to climb steps?  yes do you drive?  no use a wheelchair needs help with transfers  Function not employed: date last employed na I need assistance with the following:  bathing, toileting and meal prep  Neuro/Psych bladder control problems weakness tingling trouble walking confusion  Prior Studies na  Physicians involved in your care na   Family History  Problem Relation Age of Onset  . Arrhythmia Father     Atrial fib/Pacemaker  . Heart failure Mother   . Diabetes Mother   . Other Mother     fell and broke hip  . Diabetes Sister   . Lupus Daughter   . Mental illness Son   . ADD / ADHD Son   . Other Son     soft bones  . Diabetes Maternal Grandmother   . Stroke Paternal Grandmother   . Arthritis Paternal Grandfather   . Other Sister     MVA  . Hypertension Brother   . Diabetes Sister   . Hypertension Sister   . Colon cancer Neg Hx    Social History   Social History  . Marital Status: Widowed    Spouse  Name: N/A  . Number of Children: 2  . Years of Education: N/A   Occupational History  . retired    Social History Main Topics  . Smoking status: Former Smoker -- 0.25 packs/day for 30 years    Types: Cigarettes  . Smokeless tobacco: Never Used  . Alcohol Use: No  . Drug Use: No  . Sexual Activity: Not Currently    Birth Control/ Protection: Post-menopausal     Comment: ablation   Other Topics Concern  . None   Social History Narrative   Past Surgical History  Procedure Laterality Date  . Appendectomy    . Ovary surgery    . Cataract extraction w/phaco Left 01/16/2013    Procedure: CATARACT EXTRACTION PHACO AND INTRAOCULAR LENS PLACEMENT (IOC);  Surgeon: Elta Guadeloupe T. Gershon Crane, MD;  Location: [facility];  Service: Ophthalmology;  Laterality: Left;  CDE:14.37  . Endometrial biopsy  04/03/2013       . Hysteroscopy w/d&c N/A 04/24/2013    Procedure: SUCTION DILATATION AND CURETTAGE /HYSTEROSCOPY;  Surgeon: Jonnie Kind, MD;  Location: [facility];  Service: Gynecology;  Laterality: N/A;  . Knee arthroscopy with medial menisectomy Left 07/26/2013    Procedure: KNEE ARTHROSCOPY WITH PARTIAL MEDIAL MENISECTOMY;  Surgeon: Sanjuana Kava, MD;  Location: [facility];  Service: Orthopedics;  Laterality: Left;  . Endometrial ablation    . Colonoscopy  2009    Dr. Wilford Corner: 4 hyperplastic polyps removed. Small internal hemorrhoids. Recommended 5 year follow-up colonoscopy.  . Colonoscopy N/A 02/24/2015    Procedure: COLONOSCOPY;  Surgeon: Danie Binder, MD;  [facility];  Service: Endoscopy;  Laterality: N/A;  1030  . Hysteroscopy w/d&c N/A 04/02/2015    Procedure: UTERINE CURETTAGE, HYSTEROSCOPY;  Surgeon: Florian Buff, MD;  Location: [facility];  Service: Gynecology;  Laterality: N/A;   Past Medical History  Diagnosis Date  . Diabetes mellitus   . Asthma   . Chronic knee pain   . Back pain, chronic   . Hypertension   . Vertigo   . Hyperlipidemia   . TIA (transient ischemic attack)     . Anemia   . HOH (hard of hearing)   . Arthritis   . Obesity   . PMB (postmenopausal bleeding) 11/26/2014  . Urge incontinence 11/26/2014  . Thickened endometrium 01/15/2015  . Dysrhythmia   . Stroke     Mini Stroke   BP 171/95 mmHg  Pulse 116  SpO2 97%  LMP 03/31/2013  Opioid Risk Score:   Fall Risk Score:  `1  Depression screen PHQ 2/9  Depression screen [facility] 2/9 09/02/2015 06/27/2015  Decreased Interest 0 1  Down, Depressed, Hopeless 0 0  PHQ - 2 Score 0 1  Altered sleeping - 3  Tired, decreased energy - 2  Change in appetite - 0  Feeling bad or failure about yourself  - 0  Trouble concentrating - 3  Moving slowly or fidgety/restless - 0  Suicidal thoughts - 0  PHQ-9 Score - 9     Review of Systems  Constitutional: Positive for appetite change and unexpected weight change.  Gastrointestinal: Positive for nausea.  All other systems reviewed and are negative.      Objective:   Physical Exam  Constitutional: She is oriented to person, place, and time. She appears well-developed.  Morbid obesity  HENT:  Head: Normocephalic and atraumatic.  Eyes: Conjunctivae and EOM are normal. Pupils are equal, round, and reactive to light.  Musculoskeletal:       Left shoulder: She exhibits decreased range of motion.  Neurological: She is alert and oriented to person, place, and time.  Psychiatric: She has a normal mood and affect.  Nursing note and vitals reviewed.  Hard of hearing  Motor strength is upper extremity 2 minus at the biceps 3 minus of deltoid, 2- finger flexion and Trace extension, 4/5 in the right hip flexor and extensor ankle dorsi flexor plantar flexor Sensation is reduced to light touch in the left upper extremity  Increased tone at the left biceps Modified Ashworth score 3 at the biceps,  1-2 at the finger and wrist flexors.     Assessment & Plan:  1.  Left hemiparesis due to R fronto parietal and caudate infarcts onset May 10, 2015.    She has  made some improvements with her ADLs although still requiring physical assistance with bathing and dressing, mobility also is limited. She is asking for other potential treatments that can be beneficial for her functional outcome. It is noted that she has increased tone at the left biceps which may limit her reaching.Will treat with Botox 100 units next month  Discussed with patient and a licensed practical pushing nurse from her skilled nursing facility. No contraindications

## 2015-09-03 DIAGNOSIS — M6281 Muscle weakness (generalized): Secondary | ICD-10-CM | POA: Diagnosis not present

## 2015-09-03 DIAGNOSIS — I69954 Hemiplegia and hemiparesis following unspecified cerebrovascular disease affecting left non-dominant side: Secondary | ICD-10-CM | POA: Diagnosis not present

## 2015-09-03 DIAGNOSIS — I639 Cerebral infarction, unspecified: Secondary | ICD-10-CM | POA: Diagnosis not present

## 2015-09-03 DIAGNOSIS — R262 Difficulty in walking, not elsewhere classified: Secondary | ICD-10-CM | POA: Diagnosis not present

## 2015-09-03 DIAGNOSIS — R2681 Unsteadiness on feet: Secondary | ICD-10-CM | POA: Diagnosis not present

## 2015-09-04 DIAGNOSIS — R262 Difficulty in walking, not elsewhere classified: Secondary | ICD-10-CM | POA: Diagnosis not present

## 2015-09-04 DIAGNOSIS — R2681 Unsteadiness on feet: Secondary | ICD-10-CM | POA: Diagnosis not present

## 2015-09-04 DIAGNOSIS — M6281 Muscle weakness (generalized): Secondary | ICD-10-CM | POA: Diagnosis not present

## 2015-09-04 DIAGNOSIS — I639 Cerebral infarction, unspecified: Secondary | ICD-10-CM | POA: Diagnosis not present

## 2015-09-04 DIAGNOSIS — I69954 Hemiplegia and hemiparesis following unspecified cerebrovascular disease affecting left non-dominant side: Secondary | ICD-10-CM | POA: Diagnosis not present

## 2015-09-05 DIAGNOSIS — I639 Cerebral infarction, unspecified: Secondary | ICD-10-CM | POA: Diagnosis not present

## 2015-09-05 DIAGNOSIS — I69954 Hemiplegia and hemiparesis following unspecified cerebrovascular disease affecting left non-dominant side: Secondary | ICD-10-CM | POA: Diagnosis not present

## 2015-09-05 DIAGNOSIS — M6281 Muscle weakness (generalized): Secondary | ICD-10-CM | POA: Diagnosis not present

## 2015-09-05 DIAGNOSIS — R2681 Unsteadiness on feet: Secondary | ICD-10-CM | POA: Diagnosis not present

## 2015-09-05 DIAGNOSIS — R262 Difficulty in walking, not elsewhere classified: Secondary | ICD-10-CM | POA: Diagnosis not present

## 2015-09-06 DIAGNOSIS — R262 Difficulty in walking, not elsewhere classified: Secondary | ICD-10-CM | POA: Diagnosis not present

## 2015-09-06 DIAGNOSIS — R2681 Unsteadiness on feet: Secondary | ICD-10-CM | POA: Diagnosis not present

## 2015-09-06 DIAGNOSIS — I639 Cerebral infarction, unspecified: Secondary | ICD-10-CM | POA: Diagnosis not present

## 2015-09-06 DIAGNOSIS — M6281 Muscle weakness (generalized): Secondary | ICD-10-CM | POA: Diagnosis not present

## 2015-09-06 DIAGNOSIS — I69954 Hemiplegia and hemiparesis following unspecified cerebrovascular disease affecting left non-dominant side: Secondary | ICD-10-CM | POA: Diagnosis not present

## 2015-09-08 DIAGNOSIS — D509 Iron deficiency anemia, unspecified: Secondary | ICD-10-CM | POA: Diagnosis not present

## 2015-09-08 DIAGNOSIS — D649 Anemia, unspecified: Secondary | ICD-10-CM | POA: Diagnosis not present

## 2015-09-08 DIAGNOSIS — R262 Difficulty in walking, not elsewhere classified: Secondary | ICD-10-CM | POA: Diagnosis not present

## 2015-09-08 DIAGNOSIS — N189 Chronic kidney disease, unspecified: Secondary | ICD-10-CM | POA: Diagnosis not present

## 2015-09-08 DIAGNOSIS — R2681 Unsteadiness on feet: Secondary | ICD-10-CM | POA: Diagnosis not present

## 2015-09-08 DIAGNOSIS — I480 Paroxysmal atrial fibrillation: Secondary | ICD-10-CM | POA: Diagnosis not present

## 2015-09-08 DIAGNOSIS — Z79899 Other long term (current) drug therapy: Secondary | ICD-10-CM | POA: Diagnosis not present

## 2015-09-08 DIAGNOSIS — M6281 Muscle weakness (generalized): Secondary | ICD-10-CM | POA: Diagnosis not present

## 2015-09-08 DIAGNOSIS — R601 Generalized edema: Secondary | ICD-10-CM | POA: Diagnosis not present

## 2015-09-08 DIAGNOSIS — R0602 Shortness of breath: Secondary | ICD-10-CM | POA: Diagnosis not present

## 2015-09-08 DIAGNOSIS — I69954 Hemiplegia and hemiparesis following unspecified cerebrovascular disease affecting left non-dominant side: Secondary | ICD-10-CM | POA: Diagnosis not present

## 2015-09-08 DIAGNOSIS — E08319 Diabetes mellitus due to underlying condition with unspecified diabetic retinopathy without macular edema: Secondary | ICD-10-CM | POA: Diagnosis not present

## 2015-09-08 DIAGNOSIS — I639 Cerebral infarction, unspecified: Secondary | ICD-10-CM | POA: Diagnosis not present

## 2015-09-09 DIAGNOSIS — R2681 Unsteadiness on feet: Secondary | ICD-10-CM | POA: Diagnosis not present

## 2015-09-09 DIAGNOSIS — I639 Cerebral infarction, unspecified: Secondary | ICD-10-CM | POA: Diagnosis not present

## 2015-09-09 DIAGNOSIS — M25561 Pain in right knee: Secondary | ICD-10-CM | POA: Diagnosis not present

## 2015-09-09 DIAGNOSIS — I69954 Hemiplegia and hemiparesis following unspecified cerebrovascular disease affecting left non-dominant side: Secondary | ICD-10-CM | POA: Diagnosis not present

## 2015-09-09 DIAGNOSIS — R262 Difficulty in walking, not elsewhere classified: Secondary | ICD-10-CM | POA: Diagnosis not present

## 2015-09-09 DIAGNOSIS — M6281 Muscle weakness (generalized): Secondary | ICD-10-CM | POA: Diagnosis not present

## 2015-09-10 DIAGNOSIS — M6281 Muscle weakness (generalized): Secondary | ICD-10-CM | POA: Diagnosis not present

## 2015-09-10 DIAGNOSIS — R262 Difficulty in walking, not elsewhere classified: Secondary | ICD-10-CM | POA: Diagnosis not present

## 2015-09-10 DIAGNOSIS — I69954 Hemiplegia and hemiparesis following unspecified cerebrovascular disease affecting left non-dominant side: Secondary | ICD-10-CM | POA: Diagnosis not present

## 2015-09-10 DIAGNOSIS — R2681 Unsteadiness on feet: Secondary | ICD-10-CM | POA: Diagnosis not present

## 2015-09-10 DIAGNOSIS — I639 Cerebral infarction, unspecified: Secondary | ICD-10-CM | POA: Diagnosis not present

## 2015-09-11 DIAGNOSIS — I639 Cerebral infarction, unspecified: Secondary | ICD-10-CM | POA: Diagnosis not present

## 2015-09-11 DIAGNOSIS — R262 Difficulty in walking, not elsewhere classified: Secondary | ICD-10-CM | POA: Diagnosis not present

## 2015-09-11 DIAGNOSIS — M6281 Muscle weakness (generalized): Secondary | ICD-10-CM | POA: Diagnosis not present

## 2015-09-11 DIAGNOSIS — R2681 Unsteadiness on feet: Secondary | ICD-10-CM | POA: Diagnosis not present

## 2015-09-11 DIAGNOSIS — I69954 Hemiplegia and hemiparesis following unspecified cerebrovascular disease affecting left non-dominant side: Secondary | ICD-10-CM | POA: Diagnosis not present

## 2015-09-12 DIAGNOSIS — M6281 Muscle weakness (generalized): Secondary | ICD-10-CM | POA: Diagnosis not present

## 2015-09-12 DIAGNOSIS — R2681 Unsteadiness on feet: Secondary | ICD-10-CM | POA: Diagnosis not present

## 2015-09-12 DIAGNOSIS — R262 Difficulty in walking, not elsewhere classified: Secondary | ICD-10-CM | POA: Diagnosis not present

## 2015-09-12 DIAGNOSIS — I69954 Hemiplegia and hemiparesis following unspecified cerebrovascular disease affecting left non-dominant side: Secondary | ICD-10-CM | POA: Diagnosis not present

## 2015-09-12 DIAGNOSIS — I639 Cerebral infarction, unspecified: Secondary | ICD-10-CM | POA: Diagnosis not present

## 2015-09-15 DIAGNOSIS — I69954 Hemiplegia and hemiparesis following unspecified cerebrovascular disease affecting left non-dominant side: Secondary | ICD-10-CM | POA: Diagnosis not present

## 2015-09-15 DIAGNOSIS — R2681 Unsteadiness on feet: Secondary | ICD-10-CM | POA: Diagnosis not present

## 2015-09-15 DIAGNOSIS — G8190 Hemiplegia, unspecified affecting unspecified side: Secondary | ICD-10-CM | POA: Diagnosis not present

## 2015-09-15 DIAGNOSIS — R0602 Shortness of breath: Secondary | ICD-10-CM | POA: Diagnosis not present

## 2015-09-15 DIAGNOSIS — N189 Chronic kidney disease, unspecified: Secondary | ICD-10-CM | POA: Diagnosis not present

## 2015-09-15 DIAGNOSIS — M6281 Muscle weakness (generalized): Secondary | ICD-10-CM | POA: Diagnosis not present

## 2015-09-15 DIAGNOSIS — D649 Anemia, unspecified: Secondary | ICD-10-CM | POA: Diagnosis not present

## 2015-09-15 DIAGNOSIS — D509 Iron deficiency anemia, unspecified: Secondary | ICD-10-CM | POA: Diagnosis not present

## 2015-09-15 DIAGNOSIS — Z79899 Other long term (current) drug therapy: Secondary | ICD-10-CM | POA: Diagnosis not present

## 2015-09-15 DIAGNOSIS — I639 Cerebral infarction, unspecified: Secondary | ICD-10-CM | POA: Diagnosis not present

## 2015-09-15 DIAGNOSIS — R262 Difficulty in walking, not elsewhere classified: Secondary | ICD-10-CM | POA: Diagnosis not present

## 2015-09-15 DIAGNOSIS — E08319 Diabetes mellitus due to underlying condition with unspecified diabetic retinopathy without macular edema: Secondary | ICD-10-CM | POA: Diagnosis not present

## 2015-09-15 DIAGNOSIS — E119 Type 2 diabetes mellitus without complications: Secondary | ICD-10-CM | POA: Diagnosis not present

## 2015-09-16 DIAGNOSIS — R2681 Unsteadiness on feet: Secondary | ICD-10-CM | POA: Diagnosis not present

## 2015-09-16 DIAGNOSIS — I639 Cerebral infarction, unspecified: Secondary | ICD-10-CM | POA: Diagnosis not present

## 2015-09-16 DIAGNOSIS — I69954 Hemiplegia and hemiparesis following unspecified cerebrovascular disease affecting left non-dominant side: Secondary | ICD-10-CM | POA: Diagnosis not present

## 2015-09-16 DIAGNOSIS — R262 Difficulty in walking, not elsewhere classified: Secondary | ICD-10-CM | POA: Diagnosis not present

## 2015-09-16 DIAGNOSIS — M6281 Muscle weakness (generalized): Secondary | ICD-10-CM | POA: Diagnosis not present

## 2015-09-17 DIAGNOSIS — R262 Difficulty in walking, not elsewhere classified: Secondary | ICD-10-CM | POA: Diagnosis not present

## 2015-09-17 DIAGNOSIS — M6281 Muscle weakness (generalized): Secondary | ICD-10-CM | POA: Diagnosis not present

## 2015-09-17 DIAGNOSIS — I69954 Hemiplegia and hemiparesis following unspecified cerebrovascular disease affecting left non-dominant side: Secondary | ICD-10-CM | POA: Diagnosis not present

## 2015-09-17 DIAGNOSIS — R2681 Unsteadiness on feet: Secondary | ICD-10-CM | POA: Diagnosis not present

## 2015-09-17 DIAGNOSIS — I639 Cerebral infarction, unspecified: Secondary | ICD-10-CM | POA: Diagnosis not present

## 2015-09-18 DIAGNOSIS — I639 Cerebral infarction, unspecified: Secondary | ICD-10-CM | POA: Diagnosis not present

## 2015-09-18 DIAGNOSIS — I69954 Hemiplegia and hemiparesis following unspecified cerebrovascular disease affecting left non-dominant side: Secondary | ICD-10-CM | POA: Diagnosis not present

## 2015-09-18 DIAGNOSIS — R2681 Unsteadiness on feet: Secondary | ICD-10-CM | POA: Diagnosis not present

## 2015-09-18 DIAGNOSIS — M6281 Muscle weakness (generalized): Secondary | ICD-10-CM | POA: Diagnosis not present

## 2015-09-18 DIAGNOSIS — R262 Difficulty in walking, not elsewhere classified: Secondary | ICD-10-CM | POA: Diagnosis not present

## 2015-09-19 ENCOUNTER — Encounter: Payer: Self-pay | Admitting: Cardiology

## 2015-09-19 ENCOUNTER — Ambulatory Visit (HOSPITAL_BASED_OUTPATIENT_CLINIC_OR_DEPARTMENT_OTHER): Payer: Commercial Managed Care - HMO | Admitting: Physical Medicine & Rehabilitation

## 2015-09-19 ENCOUNTER — Encounter: Payer: Commercial Managed Care - HMO | Attending: Physical Medicine & Rehabilitation

## 2015-09-19 ENCOUNTER — Ambulatory Visit (INDEPENDENT_AMBULATORY_CARE_PROVIDER_SITE_OTHER): Payer: Commercial Managed Care - HMO | Admitting: Cardiology

## 2015-09-19 ENCOUNTER — Encounter: Payer: Self-pay | Admitting: Physical Medicine & Rehabilitation

## 2015-09-19 VITALS — BP 157/86 | HR 70 | Resp 14

## 2015-09-19 VITALS — BP 142/76 | HR 70 | Ht 60.0 in | Wt 267.6 lb

## 2015-09-19 DIAGNOSIS — M79642 Pain in left hand: Secondary | ICD-10-CM | POA: Insufficient documentation

## 2015-09-19 DIAGNOSIS — I10 Essential (primary) hypertension: Secondary | ICD-10-CM

## 2015-09-19 DIAGNOSIS — E785 Hyperlipidemia, unspecified: Secondary | ICD-10-CM | POA: Diagnosis not present

## 2015-09-19 DIAGNOSIS — I639 Cerebral infarction, unspecified: Secondary | ICD-10-CM | POA: Diagnosis not present

## 2015-09-19 DIAGNOSIS — I69854 Hemiplegia and hemiparesis following other cerebrovascular disease affecting left non-dominant side: Secondary | ICD-10-CM | POA: Diagnosis not present

## 2015-09-19 DIAGNOSIS — E119 Type 2 diabetes mellitus without complications: Secondary | ICD-10-CM | POA: Diagnosis not present

## 2015-09-19 DIAGNOSIS — G8110 Spastic hemiplegia affecting unspecified side: Secondary | ICD-10-CM | POA: Insufficient documentation

## 2015-09-19 DIAGNOSIS — R262 Difficulty in walking, not elsewhere classified: Secondary | ICD-10-CM | POA: Diagnosis not present

## 2015-09-19 DIAGNOSIS — I69954 Hemiplegia and hemiparesis following unspecified cerebrovascular disease affecting left non-dominant side: Secondary | ICD-10-CM | POA: Diagnosis not present

## 2015-09-19 DIAGNOSIS — I4891 Unspecified atrial fibrillation: Secondary | ICD-10-CM | POA: Diagnosis not present

## 2015-09-19 DIAGNOSIS — M6281 Muscle weakness (generalized): Secondary | ICD-10-CM | POA: Diagnosis not present

## 2015-09-19 DIAGNOSIS — M1990 Unspecified osteoarthritis, unspecified site: Secondary | ICD-10-CM | POA: Insufficient documentation

## 2015-09-19 DIAGNOSIS — R2681 Unsteadiness on feet: Secondary | ICD-10-CM | POA: Diagnosis not present

## 2015-09-19 MED ORDER — DILTIAZEM HCL ER COATED BEADS 180 MG PO CP24: 180.0000 mg | ORAL_CAPSULE | Freq: Every day | ORAL | Status: DC

## 2015-09-19 MED ORDER — ATORVASTATIN CALCIUM 80 MG PO TABS: 80.0000 mg | ORAL_TABLET | Freq: Every day | ORAL | Status: DC

## 2015-09-19 NOTE — Progress Notes (Signed)
Patient ID: [name], female   DOB: 1941/03/02, 74 y.o.   MRN: [id number]     Clinical Summary [name] is a 74 y.o.female seen today for follow up of the following medical problems.   1. Afib  - occasional mild palpitations.  - compliant with eliquis, denies any bleeding issues   2. HTN  - compliant with meds  3. Hyperlipidemia - last lipid panel 05/2015 TC 207 TG 75 HDL 44 LDL 148 - compliant with statin   Past Medical History  Diagnosis Date  . Diabetes mellitus   . Asthma   . Chronic knee pain   . Back pain, chronic   . Hypertension   . Vertigo   . Hyperlipidemia   . TIA (transient ischemic attack)   . Anemia   . HOH (hard of hearing)   . Arthritis   . Obesity   . PMB (postmenopausal bleeding) 11/26/2014  . Urge incontinence 11/26/2014  . Thickened endometrium 01/15/2015  . Dysrhythmia   . Stroke [facility])     Mini Stroke     Allergies  Allergen Reactions  . Imitrex [Sumatriptan] Other (See Comments)    Unknown Reaction   . Mango Flavor     Nausea and vomiting  . Oysters [Shellfish Allergy]     Nausea and vomiting     Current Outpatient Prescriptions  Medication Sig Dispense Refill  . ACCU-CHEK AVIVA PLUS test strip     . ACCU-CHEK SOFTCLIX LANCETS lancets     . acetaminophen (TYLENOL) 325 MG tablet Take 650 mg by mouth every 6 (six) hours as needed for mild pain.     . Alcohol Swabs (B-D SINGLE USE SWABS REGULAR) PADS     . apixaban (ELIQUIS) 5 MG TABS tablet Take 1 tablet (5 mg total) by mouth 2 (two) times daily. 60 tablet 3  . atorvastatin (LIPITOR) 20 MG tablet Take 1 tablet (20 mg total) by mouth daily at 6 PM. 30 tablet 2  . Blood Glucose Monitoring Suppl (ACCU-CHEK AVIVA PLUS) W/DEVICE KIT     . cholecalciferol (VITAMIN D) 1000 UNITS tablet Take 1 tablet (1,000 Units total) by mouth every morning. 30 tablet 1  . diltiazem (CARTIA XT) 120 MG 24 hr capsule Take 1 capsule (120 mg total) by mouth daily. 30 capsule 1  . diphenhydrAMINE  (BENADRYL) 25 MG tablet Take 25 mg by mouth every 6 (six) hours as needed.    . furosemide (LASIX) 20 MG tablet     . gabapentin (NEURONTIN) 100 MG capsule     . ibuprofen (ADVIL,MOTRIN) 600 MG tablet     . KLOR-CON 10 10 MEQ tablet     . KLOR-CON M20 20 MEQ tablet     . Linaclotide (LINZESS) 145 MCG CAPS capsule 1 PO 30 mins prior to your first meal (Patient taking differently: Take 145 mcg by mouth daily. ) 30 capsule 11  . medroxyPROGESTERone (PROVERA) 10 MG tablet   11  . megestrol (MEGACE) 40 MG tablet Take 1 tablet (40 mg total) by mouth daily. 30 tablet 0  . megestrol (MEGACE) 40 MG/ML suspension     . oxybutynin (DITROPAN) 5 MG tablet Take 5 mg by mouth 3 (three) times daily.    . SENNA S 8.6-50 MG per tablet TK 1 T PO QHS PRF MILD CONSTIPATION  0  . TOVIAZ 4 MG TB24 tablet TK 1 T PO D  3  . TRADJENTA 5 MG TABS tablet Take 5 mg by mouth daily.  0  . traMADol (ULTRAM) 50 MG tablet Take by mouth every 6 (six) hours as needed.    . VENTOLIN HFA 108 (90 BASE) MCG/ACT inhaler      No current facility-administered medications for this visit.     Past Surgical History  Procedure Laterality Date  . Appendectomy    . Ovary surgery    . Cataract extraction w/phaco Left 01/16/2013    Procedure: CATARACT EXTRACTION PHACO AND INTRAOCULAR LENS PLACEMENT (IOC);  Surgeon: Elta Guadeloupe T. Gershon Crane, MD;  Location: [facility];  Service: Ophthalmology;  Laterality: Left;  CDE:14.37  . Endometrial biopsy  04/03/2013       . Hysteroscopy w/d&c N/A 04/24/2013    Procedure: SUCTION DILATATION AND CURETTAGE /HYSTEROSCOPY;  Surgeon: Jonnie Kind, MD;  Location: [facility];  Service: Gynecology;  Laterality: N/A;  . Knee arthroscopy with medial menisectomy Left 07/26/2013    Procedure: KNEE ARTHROSCOPY WITH PARTIAL MEDIAL MENISECTOMY;  Surgeon: Sanjuana Kava, MD;  Location: [facility];  Service: Orthopedics;  Laterality: Left;  . Endometrial ablation    . Colonoscopy  2009    Dr. Wilford Corner: 4 hyperplastic polyps  removed. Small internal hemorrhoids. Recommended 5 year follow-up colonoscopy.  . Colonoscopy N/A 02/24/2015    Procedure: COLONOSCOPY;  Surgeon: Danie Binder, MD;  [facility];  Service: Endoscopy;  Laterality: N/A;  1030  . Hysteroscopy w/d&c N/A 04/02/2015    Procedure: UTERINE CURETTAGE, HYSTEROSCOPY;  Surgeon: Florian Buff, MD;  Location: [facility];  Service: Gynecology;  Laterality: N/A;     Allergies  Allergen Reactions  . Imitrex [Sumatriptan] Other (See Comments)    Unknown Reaction   . Mango Flavor     Nausea and vomiting  . Oysters [Shellfish Allergy]     Nausea and vomiting      Family History  Problem Relation Age of Onset  . Arrhythmia Father     Atrial fib/Pacemaker  . Heart failure Mother   . Diabetes Mother   . Other Mother     fell and broke hip  . Diabetes Sister   . Lupus Daughter   . Mental illness Son   . ADD / ADHD Son   . Other Son     soft bones  . Diabetes Maternal Grandmother   . Stroke Paternal Grandmother   . Arthritis Paternal Grandfather   . Other Sister     MVA  . Hypertension Brother   . Diabetes Sister   . Hypertension Sister   . Colon cancer Neg Hx      Social History [name] reports that she has quit smoking. Her smoking use included Cigarettes. She has a 7.5 pack-year smoking history. She has never used smokeless tobacco. [name] reports that she does not drink alcohol.   Review of Systems CONSTITUTIONAL: No weight loss, fever, chills, weakness or fatigue.  HEENT: Eyes: No visual loss, blurred vision, double vision or yellow sclerae.No hearing loss, sneezing, congestion, runny nose or sore throat.  SKIN: No rash or itching.  CARDIOVASCULAR: per HPI RESPIRATORY: No shortness of breath, cough or sputum.  GASTROINTESTINAL: No anorexia, nausea, vomiting or diarrhea. No abdominal pain or blood.  GENITOURINARY: No burning on urination, no polyuria NEUROLOGICAL: No headache, dizziness, syncope, paralysis,  ataxia, numbness or tingling in the extremities. No change in bowel or bladder control.  MUSCULOSKELETAL: No muscle, back pain, joint pain or stiffness.  LYMPHATICS: No enlarged nodes. No history of splenectomy.  PSYCHIATRIC: No history of depression or anxiety.  ENDOCRINOLOGIC: No reports of sweating, cold or heat intolerance. No polyuria or polydipsia.  Marland Kitchen   Physical Examination Filed Vitals:   09/19/15 1415  BP: 142/76  Pulse: 70   Filed Vitals:   09/19/15 1415  Height: 5' (1.524 m)  Weight: 267 lb 9.6 oz (121.383 kg)    Gen: resting comfortably, no acute distress HEENT: no scleral icterus, pupils equal round and reactive, no palptable cervical adenopathy,  CV: RRR, no m/rg, no jvd Resp: Clear to auscultation bilaterally GI: abdomen is soft, non-tender, non-distended, normal bowel sounds, no hepatosplenomegaly MSK: extremities are warm, no edema.  Skin: warm, no rash Neuro:  no focal deficits Psych: appropriate affect   Diagnostic Studies  07/2014 Echo Study Conclusions  - Left ventricle: The cavity size was normal. Systolic function was normal. The estimated ejection fraction was in the range of 55% to 60%. Wall motion was normal; there were no regional wall motion abnormalities. Doppler parameters are consistent with abnormal left ventricular relaxation (grade 1 diastolic dysfunction).  07/2015 echo Study Conclusions  - Left ventricle: The cavity size was normal. Wall thickness was increased in a pattern of mild LVH. Systolic function was normal. The estimated ejection fraction was in the range of 60% to 65%. Wall motion was normal; there were no regional wall motion abnormalities. Doppler parameters are consistent with abnormal left ventricular relaxation (grade 1 diastolic dysfunction). - Aortic valve: Mildly calcified annulus. Mildly thickened leaflets. There was mild regurgitation. Valve area (VTI): 2.32 cm^2. Valve area (Vmax): 2.04 cm^2. -  Left atrium: The atrium was mildly dilated. - Technically adequate study.   Assessment and Plan   1. Afib - notes some occasional palpitaitons, will increase dilt to 143m daily.   2. HTN - above goal, will increase dilt to 1875mdaily  4. Hyperlipidemia - not at goal, increase lipitor to 8090maily   F/u 3 months  JonArnoldo Lenis.D.

## 2015-09-19 NOTE — Progress Notes (Signed)
Botox Injection for spasticity using needle EMG guidance  Dilution: 50 Units/ml Indication: Severe spasticity which interferes with ADL,mobility and/or  hygiene and is unresponsive to medication management and other conservative care Informed consent was obtained after describing risks and benefits of the procedure with the patient. This includes bleeding, bruising, infection, excessive weakness, or medication side effects. A REMS form is on file and signed. Needle: 25g 2" needle electrode Number of units per muscle  Biceps100U  All injections were done after obtaining appropriate EMG activity and after negative drawback for blood. The patient tolerated the procedure well. Post procedure instructions were given. A followup appointment was made.

## 2015-09-19 NOTE — Patient Instructions (Signed)
Botox 100 units left bicep   You received a Botox injection today. You may experience soreness at the needle injection sites. Please call us if any of the injection sites turns red after a couple days or if there is any drainage. You may experience muscle weakness as a result of Botox. This would improve with time but can take several weeks to improve. The Botox should start working in about one week. The Botox usually last 3 months. The injection can be repeated every 3 months as needed.

## 2015-09-19 NOTE — Patient Instructions (Signed)
Your physician recommends that you schedule a follow-up appointment in: 3 MONTHS WITH KATHRYN LAWRANCE  INCREASE DILTIAZEM 180 MG DAILY  INCREASE ATORVASTATIN 80 MG DAILY  Thanks for choosing [facility]!!!

## 2015-09-20 DIAGNOSIS — I69954 Hemiplegia and hemiparesis following unspecified cerebrovascular disease affecting left non-dominant side: Secondary | ICD-10-CM | POA: Diagnosis not present

## 2015-09-20 DIAGNOSIS — R262 Difficulty in walking, not elsewhere classified: Secondary | ICD-10-CM | POA: Diagnosis not present

## 2015-09-20 DIAGNOSIS — R2681 Unsteadiness on feet: Secondary | ICD-10-CM | POA: Diagnosis not present

## 2015-09-20 DIAGNOSIS — M6281 Muscle weakness (generalized): Secondary | ICD-10-CM | POA: Diagnosis not present

## 2015-09-20 DIAGNOSIS — I639 Cerebral infarction, unspecified: Secondary | ICD-10-CM | POA: Diagnosis not present

## 2015-09-21 DIAGNOSIS — R262 Difficulty in walking, not elsewhere classified: Secondary | ICD-10-CM | POA: Diagnosis not present

## 2015-09-21 DIAGNOSIS — I639 Cerebral infarction, unspecified: Secondary | ICD-10-CM | POA: Diagnosis not present

## 2015-09-21 DIAGNOSIS — I69954 Hemiplegia and hemiparesis following unspecified cerebrovascular disease affecting left non-dominant side: Secondary | ICD-10-CM | POA: Diagnosis not present

## 2015-09-21 DIAGNOSIS — R2681 Unsteadiness on feet: Secondary | ICD-10-CM | POA: Diagnosis not present

## 2015-09-21 DIAGNOSIS — M6281 Muscle weakness (generalized): Secondary | ICD-10-CM | POA: Diagnosis not present

## 2015-09-22 DIAGNOSIS — I639 Cerebral infarction, unspecified: Secondary | ICD-10-CM | POA: Diagnosis not present

## 2015-09-22 DIAGNOSIS — I10 Essential (primary) hypertension: Secondary | ICD-10-CM | POA: Diagnosis not present

## 2015-09-22 DIAGNOSIS — R2681 Unsteadiness on feet: Secondary | ICD-10-CM | POA: Diagnosis not present

## 2015-09-22 DIAGNOSIS — Z79899 Other long term (current) drug therapy: Secondary | ICD-10-CM | POA: Diagnosis not present

## 2015-09-22 DIAGNOSIS — R262 Difficulty in walking, not elsewhere classified: Secondary | ICD-10-CM | POA: Diagnosis not present

## 2015-09-22 DIAGNOSIS — M6281 Muscle weakness (generalized): Secondary | ICD-10-CM | POA: Diagnosis not present

## 2015-09-22 DIAGNOSIS — N189 Chronic kidney disease, unspecified: Secondary | ICD-10-CM | POA: Diagnosis not present

## 2015-09-22 DIAGNOSIS — E119 Type 2 diabetes mellitus without complications: Secondary | ICD-10-CM | POA: Diagnosis not present

## 2015-09-22 DIAGNOSIS — D509 Iron deficiency anemia, unspecified: Secondary | ICD-10-CM | POA: Diagnosis not present

## 2015-09-22 DIAGNOSIS — E08319 Diabetes mellitus due to underlying condition with unspecified diabetic retinopathy without macular edema: Secondary | ICD-10-CM | POA: Diagnosis not present

## 2015-09-22 DIAGNOSIS — R0602 Shortness of breath: Secondary | ICD-10-CM | POA: Diagnosis not present

## 2015-09-22 DIAGNOSIS — D649 Anemia, unspecified: Secondary | ICD-10-CM | POA: Diagnosis not present

## 2015-09-22 DIAGNOSIS — E785 Hyperlipidemia, unspecified: Secondary | ICD-10-CM | POA: Diagnosis not present

## 2015-09-22 DIAGNOSIS — I69954 Hemiplegia and hemiparesis following unspecified cerebrovascular disease affecting left non-dominant side: Secondary | ICD-10-CM | POA: Diagnosis not present

## 2015-09-23 DIAGNOSIS — I69954 Hemiplegia and hemiparesis following unspecified cerebrovascular disease affecting left non-dominant side: Secondary | ICD-10-CM | POA: Diagnosis not present

## 2015-09-23 DIAGNOSIS — R262 Difficulty in walking, not elsewhere classified: Secondary | ICD-10-CM | POA: Diagnosis not present

## 2015-09-23 DIAGNOSIS — I639 Cerebral infarction, unspecified: Secondary | ICD-10-CM | POA: Diagnosis not present

## 2015-09-23 DIAGNOSIS — M6281 Muscle weakness (generalized): Secondary | ICD-10-CM | POA: Diagnosis not present

## 2015-09-23 DIAGNOSIS — R2681 Unsteadiness on feet: Secondary | ICD-10-CM | POA: Diagnosis not present

## 2015-09-24 DIAGNOSIS — I69954 Hemiplegia and hemiparesis following unspecified cerebrovascular disease affecting left non-dominant side: Secondary | ICD-10-CM | POA: Diagnosis not present

## 2015-09-24 DIAGNOSIS — M6281 Muscle weakness (generalized): Secondary | ICD-10-CM | POA: Diagnosis not present

## 2015-09-24 DIAGNOSIS — R262 Difficulty in walking, not elsewhere classified: Secondary | ICD-10-CM | POA: Diagnosis not present

## 2015-09-24 DIAGNOSIS — I639 Cerebral infarction, unspecified: Secondary | ICD-10-CM | POA: Diagnosis not present

## 2015-09-24 DIAGNOSIS — R2681 Unsteadiness on feet: Secondary | ICD-10-CM | POA: Diagnosis not present

## 2015-09-25 DIAGNOSIS — I639 Cerebral infarction, unspecified: Secondary | ICD-10-CM | POA: Diagnosis not present

## 2015-09-25 DIAGNOSIS — I69954 Hemiplegia and hemiparesis following unspecified cerebrovascular disease affecting left non-dominant side: Secondary | ICD-10-CM | POA: Diagnosis not present

## 2015-09-25 DIAGNOSIS — R2681 Unsteadiness on feet: Secondary | ICD-10-CM | POA: Diagnosis not present

## 2015-09-25 DIAGNOSIS — M6281 Muscle weakness (generalized): Secondary | ICD-10-CM | POA: Diagnosis not present

## 2015-09-25 DIAGNOSIS — R262 Difficulty in walking, not elsewhere classified: Secondary | ICD-10-CM | POA: Diagnosis not present

## 2015-09-26 DIAGNOSIS — R262 Difficulty in walking, not elsewhere classified: Secondary | ICD-10-CM | POA: Diagnosis not present

## 2015-09-26 DIAGNOSIS — I639 Cerebral infarction, unspecified: Secondary | ICD-10-CM | POA: Diagnosis not present

## 2015-09-26 DIAGNOSIS — M6281 Muscle weakness (generalized): Secondary | ICD-10-CM | POA: Diagnosis not present

## 2015-09-26 DIAGNOSIS — I69954 Hemiplegia and hemiparesis following unspecified cerebrovascular disease affecting left non-dominant side: Secondary | ICD-10-CM | POA: Diagnosis not present

## 2015-09-26 DIAGNOSIS — R2681 Unsteadiness on feet: Secondary | ICD-10-CM | POA: Diagnosis not present

## 2015-09-29 DIAGNOSIS — I639 Cerebral infarction, unspecified: Secondary | ICD-10-CM | POA: Diagnosis not present

## 2015-09-29 DIAGNOSIS — M6281 Muscle weakness (generalized): Secondary | ICD-10-CM | POA: Diagnosis not present

## 2015-09-29 DIAGNOSIS — R262 Difficulty in walking, not elsewhere classified: Secondary | ICD-10-CM | POA: Diagnosis not present

## 2015-09-29 DIAGNOSIS — I69954 Hemiplegia and hemiparesis following unspecified cerebrovascular disease affecting left non-dominant side: Secondary | ICD-10-CM | POA: Diagnosis not present

## 2015-09-29 DIAGNOSIS — R2681 Unsteadiness on feet: Secondary | ICD-10-CM | POA: Diagnosis not present

## 2015-09-30 DIAGNOSIS — I639 Cerebral infarction, unspecified: Secondary | ICD-10-CM | POA: Diagnosis not present

## 2015-09-30 DIAGNOSIS — R262 Difficulty in walking, not elsewhere classified: Secondary | ICD-10-CM | POA: Diagnosis not present

## 2015-09-30 DIAGNOSIS — R2681 Unsteadiness on feet: Secondary | ICD-10-CM | POA: Diagnosis not present

## 2015-09-30 DIAGNOSIS — M6281 Muscle weakness (generalized): Secondary | ICD-10-CM | POA: Diagnosis not present

## 2015-09-30 DIAGNOSIS — I69954 Hemiplegia and hemiparesis following unspecified cerebrovascular disease affecting left non-dominant side: Secondary | ICD-10-CM | POA: Diagnosis not present

## 2015-10-01 DIAGNOSIS — M6281 Muscle weakness (generalized): Secondary | ICD-10-CM | POA: Diagnosis not present

## 2015-10-01 DIAGNOSIS — I639 Cerebral infarction, unspecified: Secondary | ICD-10-CM | POA: Diagnosis not present

## 2015-10-01 DIAGNOSIS — I69954 Hemiplegia and hemiparesis following unspecified cerebrovascular disease affecting left non-dominant side: Secondary | ICD-10-CM | POA: Diagnosis not present

## 2015-10-01 DIAGNOSIS — R2681 Unsteadiness on feet: Secondary | ICD-10-CM | POA: Diagnosis not present

## 2015-10-01 DIAGNOSIS — R262 Difficulty in walking, not elsewhere classified: Secondary | ICD-10-CM | POA: Diagnosis not present

## 2015-10-02 DIAGNOSIS — I69954 Hemiplegia and hemiparesis following unspecified cerebrovascular disease affecting left non-dominant side: Secondary | ICD-10-CM | POA: Diagnosis not present

## 2015-10-02 DIAGNOSIS — R2681 Unsteadiness on feet: Secondary | ICD-10-CM | POA: Diagnosis not present

## 2015-10-02 DIAGNOSIS — R262 Difficulty in walking, not elsewhere classified: Secondary | ICD-10-CM | POA: Diagnosis not present

## 2015-10-02 DIAGNOSIS — M6281 Muscle weakness (generalized): Secondary | ICD-10-CM | POA: Diagnosis not present

## 2015-10-02 DIAGNOSIS — I639 Cerebral infarction, unspecified: Secondary | ICD-10-CM | POA: Diagnosis not present

## 2015-10-03 DIAGNOSIS — R2681 Unsteadiness on feet: Secondary | ICD-10-CM | POA: Diagnosis not present

## 2015-10-03 DIAGNOSIS — K921 Melena: Secondary | ICD-10-CM | POA: Diagnosis not present

## 2015-10-03 DIAGNOSIS — R262 Difficulty in walking, not elsewhere classified: Secondary | ICD-10-CM | POA: Diagnosis not present

## 2015-10-03 DIAGNOSIS — I69954 Hemiplegia and hemiparesis following unspecified cerebrovascular disease affecting left non-dominant side: Secondary | ICD-10-CM | POA: Diagnosis not present

## 2015-10-03 DIAGNOSIS — R300 Dysuria: Secondary | ICD-10-CM | POA: Diagnosis not present

## 2015-10-03 DIAGNOSIS — K5901 Slow transit constipation: Secondary | ICD-10-CM | POA: Diagnosis not present

## 2015-10-03 DIAGNOSIS — M6281 Muscle weakness (generalized): Secondary | ICD-10-CM | POA: Diagnosis not present

## 2015-10-03 DIAGNOSIS — I639 Cerebral infarction, unspecified: Secondary | ICD-10-CM | POA: Diagnosis not present

## 2015-10-06 DIAGNOSIS — M6281 Muscle weakness (generalized): Secondary | ICD-10-CM | POA: Diagnosis not present

## 2015-10-06 DIAGNOSIS — R262 Difficulty in walking, not elsewhere classified: Secondary | ICD-10-CM | POA: Diagnosis not present

## 2015-10-06 DIAGNOSIS — I69954 Hemiplegia and hemiparesis following unspecified cerebrovascular disease affecting left non-dominant side: Secondary | ICD-10-CM | POA: Diagnosis not present

## 2015-10-06 DIAGNOSIS — R2681 Unsteadiness on feet: Secondary | ICD-10-CM | POA: Diagnosis not present

## 2015-10-06 DIAGNOSIS — I639 Cerebral infarction, unspecified: Secondary | ICD-10-CM | POA: Diagnosis not present

## 2015-10-07 DIAGNOSIS — M25561 Pain in right knee: Secondary | ICD-10-CM | POA: Diagnosis not present

## 2015-10-07 DIAGNOSIS — R262 Difficulty in walking, not elsewhere classified: Secondary | ICD-10-CM | POA: Diagnosis not present

## 2015-10-07 DIAGNOSIS — N939 Abnormal uterine and vaginal bleeding, unspecified: Secondary | ICD-10-CM | POA: Diagnosis not present

## 2015-10-07 DIAGNOSIS — I10 Essential (primary) hypertension: Secondary | ICD-10-CM | POA: Diagnosis not present

## 2015-10-07 DIAGNOSIS — I69954 Hemiplegia and hemiparesis following unspecified cerebrovascular disease affecting left non-dominant side: Secondary | ICD-10-CM | POA: Diagnosis not present

## 2015-10-07 DIAGNOSIS — N189 Chronic kidney disease, unspecified: Secondary | ICD-10-CM | POA: Diagnosis not present

## 2015-10-07 DIAGNOSIS — R2681 Unsteadiness on feet: Secondary | ICD-10-CM | POA: Diagnosis not present

## 2015-10-07 DIAGNOSIS — I639 Cerebral infarction, unspecified: Secondary | ICD-10-CM | POA: Diagnosis not present

## 2015-10-07 DIAGNOSIS — M6281 Muscle weakness (generalized): Secondary | ICD-10-CM | POA: Diagnosis not present

## 2015-10-08 DIAGNOSIS — L84 Corns and callosities: Secondary | ICD-10-CM | POA: Diagnosis not present

## 2015-10-08 DIAGNOSIS — E1151 Type 2 diabetes mellitus with diabetic peripheral angiopathy without gangrene: Secondary | ICD-10-CM | POA: Diagnosis not present

## 2015-10-09 DIAGNOSIS — R262 Difficulty in walking, not elsewhere classified: Secondary | ICD-10-CM | POA: Diagnosis not present

## 2015-10-09 DIAGNOSIS — M25561 Pain in right knee: Secondary | ICD-10-CM | POA: Diagnosis not present

## 2015-10-09 DIAGNOSIS — I69954 Hemiplegia and hemiparesis following unspecified cerebrovascular disease affecting left non-dominant side: Secondary | ICD-10-CM | POA: Diagnosis not present

## 2015-10-09 DIAGNOSIS — B019 Varicella without complication: Secondary | ICD-10-CM | POA: Diagnosis not present

## 2015-10-09 DIAGNOSIS — I639 Cerebral infarction, unspecified: Secondary | ICD-10-CM | POA: Diagnosis not present

## 2015-10-09 DIAGNOSIS — M6281 Muscle weakness (generalized): Secondary | ICD-10-CM | POA: Diagnosis not present

## 2015-10-09 DIAGNOSIS — N939 Abnormal uterine and vaginal bleeding, unspecified: Secondary | ICD-10-CM | POA: Diagnosis not present

## 2015-10-09 DIAGNOSIS — L609 Nail disorder, unspecified: Secondary | ICD-10-CM | POA: Diagnosis not present

## 2015-10-09 DIAGNOSIS — R2681 Unsteadiness on feet: Secondary | ICD-10-CM | POA: Diagnosis not present

## 2015-10-10 ENCOUNTER — Ambulatory Visit (HOSPITAL_COMMUNITY)
Admission: RE | Admit: 2015-10-10 | Discharge: 2015-10-10 | Disposition: A | Discharge status: Home / Self Care | Payer: Commercial Managed Care - HMO | Source: Ambulatory Visit | Attending: Internal Medicine | Admitting: Internal Medicine

## 2015-10-10 ENCOUNTER — Other Ambulatory Visit (HOSPITAL_COMMUNITY): Payer: Self-pay | Admitting: Internal Medicine

## 2015-10-10 DIAGNOSIS — I10 Essential (primary) hypertension: Secondary | ICD-10-CM | POA: Diagnosis not present

## 2015-10-10 DIAGNOSIS — R262 Difficulty in walking, not elsewhere classified: Secondary | ICD-10-CM | POA: Diagnosis not present

## 2015-10-10 DIAGNOSIS — M179 Osteoarthritis of knee, unspecified: Secondary | ICD-10-CM | POA: Diagnosis not present

## 2015-10-10 DIAGNOSIS — R600 Localized edema: Secondary | ICD-10-CM

## 2015-10-10 DIAGNOSIS — I639 Cerebral infarction, unspecified: Secondary | ICD-10-CM | POA: Diagnosis not present

## 2015-10-10 DIAGNOSIS — M7989 Other specified soft tissue disorders: Secondary | ICD-10-CM | POA: Insufficient documentation

## 2015-10-10 DIAGNOSIS — M25561 Pain in right knee: Secondary | ICD-10-CM

## 2015-10-10 DIAGNOSIS — R2681 Unsteadiness on feet: Secondary | ICD-10-CM | POA: Diagnosis not present

## 2015-10-10 DIAGNOSIS — I69954 Hemiplegia and hemiparesis following unspecified cerebrovascular disease affecting left non-dominant side: Secondary | ICD-10-CM | POA: Diagnosis not present

## 2015-10-10 DIAGNOSIS — M6281 Muscle weakness (generalized): Secondary | ICD-10-CM | POA: Diagnosis not present

## 2015-10-11 DIAGNOSIS — R262 Difficulty in walking, not elsewhere classified: Secondary | ICD-10-CM | POA: Diagnosis not present

## 2015-10-11 DIAGNOSIS — I69954 Hemiplegia and hemiparesis following unspecified cerebrovascular disease affecting left non-dominant side: Secondary | ICD-10-CM | POA: Diagnosis not present

## 2015-10-11 DIAGNOSIS — M6281 Muscle weakness (generalized): Secondary | ICD-10-CM | POA: Diagnosis not present

## 2015-10-11 DIAGNOSIS — R2681 Unsteadiness on feet: Secondary | ICD-10-CM | POA: Diagnosis not present

## 2015-10-11 DIAGNOSIS — I639 Cerebral infarction, unspecified: Secondary | ICD-10-CM | POA: Diagnosis not present

## 2015-10-12 DIAGNOSIS — L609 Nail disorder, unspecified: Secondary | ICD-10-CM | POA: Diagnosis not present

## 2015-10-12 DIAGNOSIS — B019 Varicella without complication: Secondary | ICD-10-CM | POA: Diagnosis not present

## 2015-10-12 DIAGNOSIS — M25561 Pain in right knee: Secondary | ICD-10-CM | POA: Diagnosis not present

## 2015-10-12 DIAGNOSIS — N939 Abnormal uterine and vaginal bleeding, unspecified: Secondary | ICD-10-CM | POA: Diagnosis not present

## 2015-10-13 DIAGNOSIS — I639 Cerebral infarction, unspecified: Secondary | ICD-10-CM | POA: Diagnosis not present

## 2015-10-13 DIAGNOSIS — I69954 Hemiplegia and hemiparesis following unspecified cerebrovascular disease affecting left non-dominant side: Secondary | ICD-10-CM | POA: Diagnosis not present

## 2015-10-13 DIAGNOSIS — R2681 Unsteadiness on feet: Secondary | ICD-10-CM | POA: Diagnosis not present

## 2015-10-13 DIAGNOSIS — M6281 Muscle weakness (generalized): Secondary | ICD-10-CM | POA: Diagnosis not present

## 2015-10-13 DIAGNOSIS — R262 Difficulty in walking, not elsewhere classified: Secondary | ICD-10-CM | POA: Diagnosis not present

## 2015-10-14 DIAGNOSIS — I69954 Hemiplegia and hemiparesis following unspecified cerebrovascular disease affecting left non-dominant side: Secondary | ICD-10-CM | POA: Diagnosis not present

## 2015-10-14 DIAGNOSIS — I639 Cerebral infarction, unspecified: Secondary | ICD-10-CM | POA: Diagnosis not present

## 2015-10-14 DIAGNOSIS — R2681 Unsteadiness on feet: Secondary | ICD-10-CM | POA: Diagnosis not present

## 2015-10-14 DIAGNOSIS — R262 Difficulty in walking, not elsewhere classified: Secondary | ICD-10-CM | POA: Diagnosis not present

## 2015-10-14 DIAGNOSIS — M6281 Muscle weakness (generalized): Secondary | ICD-10-CM | POA: Diagnosis not present

## 2015-10-15 ENCOUNTER — Ambulatory Visit (INDEPENDENT_AMBULATORY_CARE_PROVIDER_SITE_OTHER): Payer: Commercial Managed Care - HMO | Admitting: Obstetrics and Gynecology

## 2015-10-15 ENCOUNTER — Encounter: Payer: Self-pay | Admitting: Obstetrics and Gynecology

## 2015-10-15 VITALS — BP 140/78

## 2015-10-15 DIAGNOSIS — N939 Abnormal uterine and vaginal bleeding, unspecified: Secondary | ICD-10-CM | POA: Diagnosis not present

## 2015-10-15 DIAGNOSIS — I639 Cerebral infarction, unspecified: Secondary | ICD-10-CM | POA: Diagnosis not present

## 2015-10-15 DIAGNOSIS — N950 Postmenopausal bleeding: Secondary | ICD-10-CM | POA: Diagnosis not present

## 2015-10-15 DIAGNOSIS — M6281 Muscle weakness (generalized): Secondary | ICD-10-CM | POA: Diagnosis not present

## 2015-10-15 DIAGNOSIS — R197 Diarrhea, unspecified: Secondary | ICD-10-CM | POA: Diagnosis not present

## 2015-10-15 DIAGNOSIS — N8501 Benign endometrial hyperplasia: Secondary | ICD-10-CM

## 2015-10-15 DIAGNOSIS — R262 Difficulty in walking, not elsewhere classified: Secondary | ICD-10-CM | POA: Diagnosis not present

## 2015-10-15 DIAGNOSIS — R05 Cough: Secondary | ICD-10-CM | POA: Diagnosis not present

## 2015-10-15 DIAGNOSIS — R2681 Unsteadiness on feet: Secondary | ICD-10-CM | POA: Diagnosis not present

## 2015-10-15 DIAGNOSIS — I69954 Hemiplegia and hemiparesis following unspecified cerebrovascular disease affecting left non-dominant side: Secondary | ICD-10-CM | POA: Diagnosis not present

## 2015-10-15 NOTE — Progress Notes (Signed)
[name] Visit  Patient name: [name] MRN [id number]  Date of birth: [birth year]  CC & HPI:  [name] is a 74 y.o. female presenting today for f/u of postmenopausal bleeding on anticoagulant S/P CVA and IUD check. She notes continued vaginal bleeding. She states she wears a pad that she changes twice a day. Per note from last visit with me, she has a history of pm bleeding, and had hysteroscopy and D&C in April showing coplex and simple hyperplasia Without atypia. She is s/p CVA with lifetime anticoagulation and having persistent PM bleeding , very lite. Patient is on Lasix 40 mg  . She is a very severe surgical risk candidate.  ROS:  A complete review of systems was obtained and all systems are negative except as noted in the HPI and PMH.    Pertinent History Reviewed:   Reviewed: Significant for ovary surgery, Endometrial ablation, Hysteroscopy w/ D&C Medical         Past Medical History  Diagnosis Date  . Diabetes mellitus   . Asthma   . Chronic knee pain   . Back pain, chronic   . Hypertension   . Vertigo   . Hyperlipidemia   . TIA (transient ischemic attack)   . Anemia   . HOH (hard of hearing)   . Arthritis   . Obesity   . PMB (postmenopausal bleeding) 11/26/2014  . Urge incontinence 11/26/2014  . Thickened endometrium 01/15/2015  . Dysrhythmia   . Stroke [facility])     Mini Stroke                              Surgical Hx:    Past Surgical History  Procedure Laterality Date  . Appendectomy    . Ovary surgery    . Cataract extraction w/phaco Left 01/16/2013    Procedure: CATARACT EXTRACTION PHACO AND INTRAOCULAR LENS PLACEMENT (IOC);  Surgeon: Elta Guadeloupe T. Gershon Crane, MD;  Location: [facility];  Service: Ophthalmology;  Laterality: Left;  CDE:14.37  . Endometrial biopsy  04/03/2013       . Hysteroscopy w/d&c N/A 04/24/2013    Procedure: SUCTION DILATATION AND CURETTAGE /HYSTEROSCOPY;  Surgeon: Jonnie Kind, MD;  Location: [facility] OR[facility];  Service: Gynecology;   Laterality: N/A;  . Knee arthroscopy with medial menisectomy Left 07/26/2013    Procedure: KNEE ARTHROSCOPY WITH PARTIAL MEDIAL MENISECTOMY;  Surgeon: Sanjuana Kava, MD;  Location: [facility] OR[facility];  Service: Orthopedics;  Laterality: Left;  . Endometrial ablation    . Colonoscopy  2009    Dr. Wilford Corner: 4 hyperplastic polyps removed. Small internal hemorrhoids. Recommended 5 year follow-up colonoscopy.  . Colonoscopy N/A 02/24/2015    Procedure: COLONOSCOPY;  Surgeon: Danie Binder, MD;  Location: [facility] END[facility] SU[facility];  Service: Endoscopy;  Laterality: N/A;  1030  . Hysteroscopy w/d&c N/A 04/02/2015    Procedure: UTERINE CURETTAGE, HYSTEROSCOPY;  Surgeon: Florian Buff, MD;  Location: [facility] OR[facility];  Service: Gynecology;  Laterality: N/A;   Medications: Reviewed & Updated - see associated section                       Current outpatient prescriptions:  .  ACCU-CHEK AVIVA PLUS test strip, , Disp: , Rfl:  .  ACCU-CHEK SOFTCLIX LANCETS lancets, , Disp: , Rfl:  .  acetaminophen (TYLENOL) 325 MG tablet, Take 650 mg by mouth every 6 (six) hours as needed for mild pain. ,  Disp: , Rfl:  .  Alcohol Swabs (B-D SINGLE USE SWABS REGULAR) PADS, , Disp: , Rfl:  .  apixaban (ELIQUIS) 5 MG TABS tablet, Take 1 tablet (5 mg total) by mouth 2 (two) times daily., Disp: 60 tablet, Rfl: 3 .  atorvastatin (LIPITOR) 80 MG tablet, Take 1 tablet (80 mg total) by mouth daily., Disp: 90 tablet, Rfl: 3 .  Blood Glucose Monitoring Suppl (ACCU-CHEK AVIVA PLUS) W/DEVICE KIT, , Disp: , Rfl:  .  cholecalciferol (VITAMIN D) 1000 UNITS tablet, Take 1 tablet (1,000 Units total) by mouth every morning., Disp: 30 tablet, Rfl: 1 .  diltiazem (CARDIZEM CD) 180 MG 24 hr capsule, Take 1 capsule (180 mg total) by mouth daily., Disp: 90 capsule, Rfl: 3 .  diphenhydrAMINE (BENADRYL) 25 MG tablet, Take 25 mg by mouth every 6 (six) hours as needed., Disp: , Rfl:  .  furosemide (LASIX) 20 MG tablet, , Disp: , Rfl:  .  gabapentin (NEURONTIN) 100 MG capsule,  , Disp: , Rfl:  .  ibuprofen (ADVIL,MOTRIN) 600 MG tablet, , Disp: , Rfl:  .  KLOR-CON M20 20 MEQ tablet, , Disp: , Rfl:  .  Linaclotide (LINZESS) 145 MCG CAPS capsule, 1 PO 30 mins prior to your first meal (Patient taking differently: Take 145 mcg by mouth daily. ), Disp: 30 capsule, Rfl: 11 .  megestrol (MEGACE) 40 MG tablet, Take 1 tablet (40 mg total) by mouth daily., Disp: 30 tablet, Rfl: 0 .  oxybutynin (DITROPAN-XL) 5 MG 24 hr tablet, , Disp: , Rfl:  .  TOVIAZ 4 MG TB24 tablet, TK 1 T PO D, Disp: , Rfl: 3 .  TRADJENTA 5 MG TABS tablet, Take 5 mg by mouth daily. , Disp: , Rfl: 0 .  traMADol (ULTRAM) 50 MG tablet, Take by mouth every 6 (six) hours as needed., Disp: , Rfl:  .  VENTOLIN HFA 108 (90 BASE) MCG/ACT inhaler, , Disp: , Rfl:    Social History: Reviewed -  reports that she quit smoking about 4 months ago. Her smoking use included Cigarettes. She has a 7.5 pack-year smoking history. She has never used smokeless tobacco.  Objective Findings:  Vitals: Blood pressure 140/78, last menstrual period 03/31/2013.  Physical Examination: General appearance - alert, well appearing, and in no distress, oriented to person, place, and time and overweight Mental status - alert, oriented to person, place, and time, normal mood, behavior, speech, dress, motor activity, and thought processes, affect appropriate to mood   Assessment & Plan:   A:  1. Postmenopausal bleeding, benign endometrium.   P:  1. Continue suppressing with IUD.  2. Weight loss encouraged.  3. F/u in 3 months for check-up, then 6 months   By signing my name below, I, Stephania Fragmin, attest that this documentation has been prepared under the direction and in the presence of Jonnie Kind, MD. Electronically Signed: Stephania Fragmin, ED Scribe. 10/15/2015. 10:44 AM.   I personally performed the services described in this documentation, which was SCRIBED in my presence. The recorded information has been reviewed and considered  accurate. It has been edited as necessary during review. Jonnie Kind, MD   (scribe attestation statement)

## 2015-10-16 DIAGNOSIS — I69954 Hemiplegia and hemiparesis following unspecified cerebrovascular disease affecting left non-dominant side: Secondary | ICD-10-CM | POA: Diagnosis not present

## 2015-10-16 DIAGNOSIS — R262 Difficulty in walking, not elsewhere classified: Secondary | ICD-10-CM | POA: Diagnosis not present

## 2015-10-16 DIAGNOSIS — I639 Cerebral infarction, unspecified: Secondary | ICD-10-CM | POA: Diagnosis not present

## 2015-10-16 DIAGNOSIS — M6281 Muscle weakness (generalized): Secondary | ICD-10-CM | POA: Diagnosis not present

## 2015-10-16 DIAGNOSIS — R2681 Unsteadiness on feet: Secondary | ICD-10-CM | POA: Diagnosis not present

## 2015-10-17 DIAGNOSIS — I639 Cerebral infarction, unspecified: Secondary | ICD-10-CM | POA: Diagnosis not present

## 2015-10-17 DIAGNOSIS — R262 Difficulty in walking, not elsewhere classified: Secondary | ICD-10-CM | POA: Diagnosis not present

## 2015-10-17 DIAGNOSIS — M6281 Muscle weakness (generalized): Secondary | ICD-10-CM | POA: Diagnosis not present

## 2015-10-17 DIAGNOSIS — I69954 Hemiplegia and hemiparesis following unspecified cerebrovascular disease affecting left non-dominant side: Secondary | ICD-10-CM | POA: Diagnosis not present

## 2015-10-17 DIAGNOSIS — R2681 Unsteadiness on feet: Secondary | ICD-10-CM | POA: Diagnosis not present

## 2015-10-19 DIAGNOSIS — R197 Diarrhea, unspecified: Secondary | ICD-10-CM | POA: Diagnosis not present

## 2015-10-19 DIAGNOSIS — N939 Abnormal uterine and vaginal bleeding, unspecified: Secondary | ICD-10-CM | POA: Diagnosis not present

## 2015-10-19 DIAGNOSIS — R05 Cough: Secondary | ICD-10-CM | POA: Diagnosis not present

## 2015-10-20 DIAGNOSIS — R262 Difficulty in walking, not elsewhere classified: Secondary | ICD-10-CM | POA: Diagnosis not present

## 2015-10-20 DIAGNOSIS — R05 Cough: Secondary | ICD-10-CM | POA: Diagnosis not present

## 2015-10-20 DIAGNOSIS — I639 Cerebral infarction, unspecified: Secondary | ICD-10-CM | POA: Diagnosis not present

## 2015-10-20 DIAGNOSIS — M6281 Muscle weakness (generalized): Secondary | ICD-10-CM | POA: Diagnosis not present

## 2015-10-20 DIAGNOSIS — I69954 Hemiplegia and hemiparesis following unspecified cerebrovascular disease affecting left non-dominant side: Secondary | ICD-10-CM | POA: Diagnosis not present

## 2015-10-20 DIAGNOSIS — R2681 Unsteadiness on feet: Secondary | ICD-10-CM | POA: Diagnosis not present

## 2015-10-21 DIAGNOSIS — R2681 Unsteadiness on feet: Secondary | ICD-10-CM | POA: Diagnosis not present

## 2015-10-21 DIAGNOSIS — I69954 Hemiplegia and hemiparesis following unspecified cerebrovascular disease affecting left non-dominant side: Secondary | ICD-10-CM | POA: Diagnosis not present

## 2015-10-21 DIAGNOSIS — M6281 Muscle weakness (generalized): Secondary | ICD-10-CM | POA: Diagnosis not present

## 2015-10-21 DIAGNOSIS — D509 Iron deficiency anemia, unspecified: Secondary | ICD-10-CM | POA: Diagnosis not present

## 2015-10-21 DIAGNOSIS — I639 Cerebral infarction, unspecified: Secondary | ICD-10-CM | POA: Diagnosis not present

## 2015-10-21 DIAGNOSIS — R262 Difficulty in walking, not elsewhere classified: Secondary | ICD-10-CM | POA: Diagnosis not present

## 2015-10-22 DIAGNOSIS — M6281 Muscle weakness (generalized): Secondary | ICD-10-CM | POA: Diagnosis not present

## 2015-10-22 DIAGNOSIS — R262 Difficulty in walking, not elsewhere classified: Secondary | ICD-10-CM | POA: Diagnosis not present

## 2015-10-22 DIAGNOSIS — I69954 Hemiplegia and hemiparesis following unspecified cerebrovascular disease affecting left non-dominant side: Secondary | ICD-10-CM | POA: Diagnosis not present

## 2015-10-22 DIAGNOSIS — I639 Cerebral infarction, unspecified: Secondary | ICD-10-CM | POA: Diagnosis not present

## 2015-10-22 DIAGNOSIS — R2681 Unsteadiness on feet: Secondary | ICD-10-CM | POA: Diagnosis not present

## 2015-10-23 DIAGNOSIS — I69954 Hemiplegia and hemiparesis following unspecified cerebrovascular disease affecting left non-dominant side: Secondary | ICD-10-CM | POA: Diagnosis not present

## 2015-10-23 DIAGNOSIS — R2681 Unsteadiness on feet: Secondary | ICD-10-CM | POA: Diagnosis not present

## 2015-10-23 DIAGNOSIS — R262 Difficulty in walking, not elsewhere classified: Secondary | ICD-10-CM | POA: Diagnosis not present

## 2015-10-23 DIAGNOSIS — M6281 Muscle weakness (generalized): Secondary | ICD-10-CM | POA: Diagnosis not present

## 2015-10-23 DIAGNOSIS — I639 Cerebral infarction, unspecified: Secondary | ICD-10-CM | POA: Diagnosis not present

## 2015-10-24 DIAGNOSIS — I639 Cerebral infarction, unspecified: Secondary | ICD-10-CM | POA: Diagnosis not present

## 2015-10-24 DIAGNOSIS — M6281 Muscle weakness (generalized): Secondary | ICD-10-CM | POA: Diagnosis not present

## 2015-10-24 DIAGNOSIS — R262 Difficulty in walking, not elsewhere classified: Secondary | ICD-10-CM | POA: Diagnosis not present

## 2015-10-24 DIAGNOSIS — R05 Cough: Secondary | ICD-10-CM | POA: Diagnosis not present

## 2015-10-24 DIAGNOSIS — R062 Wheezing: Secondary | ICD-10-CM | POA: Diagnosis not present

## 2015-10-24 DIAGNOSIS — R2681 Unsteadiness on feet: Secondary | ICD-10-CM | POA: Diagnosis not present

## 2015-10-24 DIAGNOSIS — I69954 Hemiplegia and hemiparesis following unspecified cerebrovascular disease affecting left non-dominant side: Secondary | ICD-10-CM | POA: Diagnosis not present

## 2015-10-27 ENCOUNTER — Ambulatory Visit (HOSPITAL_BASED_OUTPATIENT_CLINIC_OR_DEPARTMENT_OTHER): Payer: Commercial Managed Care - HMO | Admitting: Physical Medicine & Rehabilitation

## 2015-10-27 ENCOUNTER — Encounter: Payer: Commercial Managed Care - HMO | Attending: Physical Medicine & Rehabilitation

## 2015-10-27 ENCOUNTER — Encounter: Payer: Self-pay | Admitting: Physical Medicine & Rehabilitation

## 2015-10-27 VITALS — BP 155/77 | HR 98

## 2015-10-27 DIAGNOSIS — R262 Difficulty in walking, not elsewhere classified: Secondary | ICD-10-CM | POA: Diagnosis not present

## 2015-10-27 DIAGNOSIS — R2681 Unsteadiness on feet: Secondary | ICD-10-CM | POA: Diagnosis not present

## 2015-10-27 DIAGNOSIS — I69854 Hemiplegia and hemiparesis following other cerebrovascular disease affecting left non-dominant side: Secondary | ICD-10-CM | POA: Diagnosis not present

## 2015-10-27 DIAGNOSIS — M1990 Unspecified osteoarthritis, unspecified site: Secondary | ICD-10-CM | POA: Diagnosis not present

## 2015-10-27 DIAGNOSIS — I10 Essential (primary) hypertension: Secondary | ICD-10-CM | POA: Insufficient documentation

## 2015-10-27 DIAGNOSIS — M79642 Pain in left hand: Secondary | ICD-10-CM | POA: Insufficient documentation

## 2015-10-27 DIAGNOSIS — M6281 Muscle weakness (generalized): Secondary | ICD-10-CM | POA: Diagnosis not present

## 2015-10-27 DIAGNOSIS — G8110 Spastic hemiplegia affecting unspecified side: Secondary | ICD-10-CM

## 2015-10-27 DIAGNOSIS — E785 Hyperlipidemia, unspecified: Secondary | ICD-10-CM | POA: Diagnosis not present

## 2015-10-27 DIAGNOSIS — I69954 Hemiplegia and hemiparesis following unspecified cerebrovascular disease affecting left non-dominant side: Secondary | ICD-10-CM | POA: Diagnosis not present

## 2015-10-27 DIAGNOSIS — E119 Type 2 diabetes mellitus without complications: Secondary | ICD-10-CM | POA: Diagnosis not present

## 2015-10-27 DIAGNOSIS — I639 Cerebral infarction, unspecified: Secondary | ICD-10-CM | POA: Diagnosis not present

## 2015-10-27 NOTE — Patient Instructions (Signed)
Botox usually lasts about 3 months. Will schedule in about another 7 weeks for another injection

## 2015-10-27 NOTE — Progress Notes (Signed)
Subjective:    Patient ID: [name], female    DOB: 1941/07/12, 74 y.o.   MRN: [id number]  HPI  74 year old female with history of right ACA and right MCA infarct onset 05/10/2015. She underwent inpatient rehabilitation at [facility]:  05/14/2015 DATE OF DISCHARGE:  05/21/2015  She currently is at [facility] in [place] and receiving PT and OT. Complaining of right knee pain. Has been seeing Dr. Luna Glasgow from orthopedics for this. Has had a right knee injection. No falls or trauma to that area recently. Had a left knee arthroscopic surgery about a year ago.  Patient feels like the left arm is a little looser. Patient states she has about 3 more weeks of physical and occupational therapy.  Had Botox injection 100 units to the left biceps muscle On 09/19/2015.No abnormal bleeding complications  Pain Inventory Average Pain 3 Pain Right Now 3 My pain is intermittent and aching  In the last 24 hours, has pain interfered with the following? General activity 0 Relation with others 0 Enjoyment of life 1 What TIME of day is your pain at its worst? night Sleep (in general) Poor  Pain is worse with: some activites Pain improves with: rest and medication Relief from Meds: 7  Mobility ability to climb steps?  no do you drive?  no use a wheelchair needs help with transfers  Function disabled: date disabled NA I need assistance with the following:  dressing, bathing, toileting, meal prep, household duties and shopping  Neuro/Psych bladder control problems bowel control problems weakness numbness trouble walking  Prior Studies Any changes since last visit?  no  Physicians involved in your care Any changes since last visit?  no   Family History  Problem Relation Age of Onset  . Arrhythmia Father     Atrial fib/Pacemaker  . Heart failure Mother   . Diabetes Mother   . Other Mother     fell and broke hip  .  Diabetes Sister   . Lupus Daughter   . Mental illness Son   . ADD / ADHD Son   . Other Son     soft bones  . Diabetes Maternal Grandmother   . Stroke Paternal Grandmother   . Arthritis Paternal Grandfather   . Other Sister     MVA  . Hypertension Brother   . Diabetes Sister   . Hypertension Sister   . Colon cancer Neg Hx    Social History   Social History  . Marital Status: Widowed    Spouse Name: N/A  . Number of Children: 2  . Years of Education: N/A   Occupational History  . retired    Social History Main Topics  . Smoking status: Former Smoker -- 0.25 packs/day for 30 years    Types: Cigarettes    Quit date: 05/20/2015  . Smokeless tobacco: Never Used  . Alcohol Use: No  . Drug Use: No  . Sexual Activity: Not Currently    Birth Control/ Protection: Post-menopausal     Comment: ablation   Other Topics Concern  . None   Social History Narrative   Past Surgical History  Procedure Laterality Date  . Appendectomy    . Ovary surgery    . Cataract extraction w/phaco Left 01/16/2013    Procedure: CATARACT EXTRACTION PHACO AND INTRAOCULAR LENS PLACEMENT (IOC);  Surgeon: Elta Guadeloupe T. Gershon Crane, MD;  Location: [facility];  Service: Ophthalmology;  Laterality: Left;  CDE:14.37  . Endometrial  biopsy  04/03/2013       . Hysteroscopy w/d&c N/A 04/24/2013    Procedure: SUCTION DILATATION AND CURETTAGE /HYSTEROSCOPY;  Surgeon: Jonnie Kind, MD;  Location: [facility];  Service: Gynecology;  Laterality: N/A;  . Knee arthroscopy with medial menisectomy Left 07/26/2013    Procedure: KNEE ARTHROSCOPY WITH PARTIAL MEDIAL MENISECTOMY;  Surgeon: Sanjuana Kava, MD;  Location: [facility];  Service: Orthopedics;  Laterality: Left;  . Endometrial ablation    . Colonoscopy  2009    Dr. Wilford Corner: 4 hyperplastic polyps removed. Small internal hemorrhoids. Recommended 5 year follow-up colonoscopy.  . Colonoscopy N/A 02/24/2015    Procedure: COLONOSCOPY;  Surgeon: Danie Binder, MD;  Location: [facility]  ENDO SU[facility];  Service: Endoscopy;  Laterality: N/A;  1030  . Hysteroscopy w/d&c N/A 04/02/2015    Procedure: UTERINE CURETTAGE, HYSTEROSCOPY;  Surgeon: Florian Buff, MD;  Location: [facility];  Service: Gynecology;  Laterality: N/A;   Past Medical History  Diagnosis Date  . Diabetes mellitus   . Asthma   . Chronic knee pain   . Back pain, chronic   . Hypertension   . Vertigo   . Hyperlipidemia   . TIA (transient ischemic attack)   . Anemia   . HOH (hard of hearing)   . Arthritis   . Obesity   . PMB (postmenopausal bleeding) 11/26/2014  . Urge incontinence 11/26/2014  . Thickened endometrium 01/15/2015  . Dysrhythmia   . Stroke ([facility])     Mini Stroke   BP 155/77 mmHg  Pulse 98  SpO2 100%  LMP 03/31/2013  Opioid Risk Score:   Fall Risk Score:  `1  Depression screen PHQ 2/9  Depression screen [facility] 2/9 09/02/2015 06/27/2015  Decreased Interest 0 1  Down, Depressed, Hopeless 0 0  PHQ - 2 Score 0 1  Altered sleeping - 3  Tired, decreased energy - 2  Change in appetite - 0  Feeling bad or failure about yourself  - 0  Trouble concentrating - 3  Moving slowly or fidgety/restless - 0  Suicidal thoughts - 0  PHQ-9 Score - 9     Review of Systems  Constitutional: Positive for diaphoresis.  Respiratory: Positive for cough, shortness of breath and wheezing.   Gastrointestinal: Positive for diarrhea.       Bowel Control Problems  Genitourinary:       Bladder control problems  Neurological: Positive for weakness and numbness.       Gait Instability  All other systems reviewed and are negative.      Objective:   Physical Exam  Left upper extremity modified Ashworth score of 0 at the biceps MAS 3 at the pronator MAS 2 at the finger flexors left fourth and fifth digits  MAS 0 at the wrist flexors and at the thumb flexor  Motor strength 3 minus at the deltoid, 3 minus at the biceps and 0 at the wrist extensor and finger extensors, 2 minus wrist flexor, trace finger  flexors  3/5 left knee extensor, 3 left ankle dorsiflexor and plantar flexor, 3 minus left hip flexor      Assessment & Plan:  1. Left spastic hemiplegia improvement in elbow flexor spasticity after Botox injection 100 units performed ~ 5 weeks ago. We will need another injection in approximately 7 weeks. Would include pronator teres as well Continue PT OT at the skilled nursing facility

## 2015-10-28 DIAGNOSIS — I69954 Hemiplegia and hemiparesis following unspecified cerebrovascular disease affecting left non-dominant side: Secondary | ICD-10-CM | POA: Diagnosis not present

## 2015-10-28 DIAGNOSIS — R262 Difficulty in walking, not elsewhere classified: Secondary | ICD-10-CM | POA: Diagnosis not present

## 2015-10-28 DIAGNOSIS — I639 Cerebral infarction, unspecified: Secondary | ICD-10-CM | POA: Diagnosis not present

## 2015-10-28 DIAGNOSIS — M6281 Muscle weakness (generalized): Secondary | ICD-10-CM | POA: Diagnosis not present

## 2015-10-28 DIAGNOSIS — R2681 Unsteadiness on feet: Secondary | ICD-10-CM | POA: Diagnosis not present

## 2015-10-29 DIAGNOSIS — R262 Difficulty in walking, not elsewhere classified: Secondary | ICD-10-CM | POA: Diagnosis not present

## 2015-10-29 DIAGNOSIS — R2681 Unsteadiness on feet: Secondary | ICD-10-CM | POA: Diagnosis not present

## 2015-10-29 DIAGNOSIS — M6281 Muscle weakness (generalized): Secondary | ICD-10-CM | POA: Diagnosis not present

## 2015-10-29 DIAGNOSIS — I639 Cerebral infarction, unspecified: Secondary | ICD-10-CM | POA: Diagnosis not present

## 2015-10-29 DIAGNOSIS — I69954 Hemiplegia and hemiparesis following unspecified cerebrovascular disease affecting left non-dominant side: Secondary | ICD-10-CM | POA: Diagnosis not present

## 2015-10-30 DIAGNOSIS — I69954 Hemiplegia and hemiparesis following unspecified cerebrovascular disease affecting left non-dominant side: Secondary | ICD-10-CM | POA: Diagnosis not present

## 2015-10-30 DIAGNOSIS — R2681 Unsteadiness on feet: Secondary | ICD-10-CM | POA: Diagnosis not present

## 2015-10-30 DIAGNOSIS — I639 Cerebral infarction, unspecified: Secondary | ICD-10-CM | POA: Diagnosis not present

## 2015-10-30 DIAGNOSIS — R262 Difficulty in walking, not elsewhere classified: Secondary | ICD-10-CM | POA: Diagnosis not present

## 2015-10-30 DIAGNOSIS — M6281 Muscle weakness (generalized): Secondary | ICD-10-CM | POA: Diagnosis not present

## 2015-10-31 DIAGNOSIS — I639 Cerebral infarction, unspecified: Secondary | ICD-10-CM | POA: Diagnosis not present

## 2015-10-31 DIAGNOSIS — R262 Difficulty in walking, not elsewhere classified: Secondary | ICD-10-CM | POA: Diagnosis not present

## 2015-10-31 DIAGNOSIS — I69954 Hemiplegia and hemiparesis following unspecified cerebrovascular disease affecting left non-dominant side: Secondary | ICD-10-CM | POA: Diagnosis not present

## 2015-10-31 DIAGNOSIS — R2681 Unsteadiness on feet: Secondary | ICD-10-CM | POA: Diagnosis not present

## 2015-10-31 DIAGNOSIS — M6281 Muscle weakness (generalized): Secondary | ICD-10-CM | POA: Diagnosis not present

## 2015-11-03 ENCOUNTER — Ambulatory Visit (INDEPENDENT_AMBULATORY_CARE_PROVIDER_SITE_OTHER): Payer: Commercial Managed Care - HMO | Admitting: Neurology

## 2015-11-03 ENCOUNTER — Encounter: Payer: Self-pay | Admitting: Neurology

## 2015-11-03 DIAGNOSIS — N939 Abnormal uterine and vaginal bleeding, unspecified: Secondary | ICD-10-CM

## 2015-11-03 DIAGNOSIS — I10 Essential (primary) hypertension: Secondary | ICD-10-CM | POA: Diagnosis not present

## 2015-11-03 DIAGNOSIS — E785 Hyperlipidemia, unspecified: Secondary | ICD-10-CM | POA: Diagnosis not present

## 2015-11-03 DIAGNOSIS — E1159 Type 2 diabetes mellitus with other circulatory complications: Secondary | ICD-10-CM | POA: Diagnosis not present

## 2015-11-03 DIAGNOSIS — I63411 Cerebral infarction due to embolism of right middle cerebral artery: Secondary | ICD-10-CM

## 2015-11-03 DIAGNOSIS — I482 Chronic atrial fibrillation, unspecified: Secondary | ICD-10-CM

## 2015-11-03 DIAGNOSIS — I69954 Hemiplegia and hemiparesis following unspecified cerebrovascular disease affecting left non-dominant side: Secondary | ICD-10-CM | POA: Diagnosis not present

## 2015-11-03 DIAGNOSIS — I639 Cerebral infarction, unspecified: Secondary | ICD-10-CM | POA: Diagnosis not present

## 2015-11-03 DIAGNOSIS — R262 Difficulty in walking, not elsewhere classified: Secondary | ICD-10-CM | POA: Diagnosis not present

## 2015-11-03 DIAGNOSIS — M6281 Muscle weakness (generalized): Secondary | ICD-10-CM | POA: Diagnosis not present

## 2015-11-03 DIAGNOSIS — R2681 Unsteadiness on feet: Secondary | ICD-10-CM | POA: Diagnosis not present

## 2015-11-03 NOTE — Patient Instructions (Addendum)
-   continue eliquis and lipitor for stroke prevention - need aggressive PT/OT and self active exercise on the left arm - increase activity and walk with help - if have near syncope symptoms again, call nurse and doctor in NH to check BP, heart rate and glucose, and further management if needed - follow up with Dr. Loetta Rough about discomfort with IUD - Follow up with your primary care physician for stroke risk factor modification. Recommend maintain blood pressure goal <130/80, diabetes with hemoglobin A1c goal below 6.5% and lipids with LDL cholesterol goal below 70 mg/dL.  - bleeding precautions. - check BP and glucose in facility. - follow up in 3 months.

## 2015-11-03 NOTE — Progress Notes (Signed)
STROKE NEUROLOGY FOLLOW UP NOTE  NAME: [name] DOB: [birth year]  REASON FOR VISIT: stroke follow up HISTORY FROM: pt and daughter and chart  Today we had the pleasure of seeing [name] in follow-up at our Neurology Clinic. Pt was accompanied by daughter.   History Summary [name] is a 74 y.o. female with history of atrial fibrillation on aspirin 81 mg daily prior to admission, hypertension, a previous TIA and stroke, hyperlipidemia, diabetes mellitus, and vaginal bleeding was admitted on 05/10/15 for left arm weakness. She did not receive IV t-PA due to vaginal bleeding history. She actually was first admitted in 07/2014 for AMS and left LE weakness. MRI showed right ACA infarct. Symptoms resolved and she was considered anticoagulation. However, she was previously on coumadin but discontinued due to vaginal bleeding. Therefore, we recommend at that time that pt to discuss with OBGYN regarding potential hysterectomy and consider [facility] after the surgery. During the interval time, she was kept on ASA and did not start AC due to ongoing vaginal bleeding. On the current admission, she still has vaginal bleeding but MRI showed right MCA and right caudate infarct, still likely due to afib not on AC. Discussed with OBGYN and was told pt not candidate for hysterectomy but OK to start [facility]. She was put on heparin drip and then switched to eliquis. Her vaginal bleeding was controlled later by Megace. She still has left UE plegia and later sent to CIR.   07/29/15 follow up - the patient has been doing better. During the interval time, she was discharged from CIR to home, however, at home, family was not able to take care of her. She eventually decided to go to SNF for further care. Currently she is in SNF with PT/OT for her left UE weakness. She is still on eliquis and lipitor. She was recently received IUD on 07/17/2015 for vaginal bleeding treatment. As per pt and daughter, her vaginal  bleeding was controlled now and no more bleeding. Megace discontinued. Blood pressure 131/75. Her left upper extremity had some improvement with therapy but still has significant weakness.    Interval History During the interval time, her left UE weakness is improved. She is able to lift up left UE now although still weak. She followed up with her OBGYN and continued on IUD for vaginal bleeding treatment. She stated that she still has small amount of vaginal bleeding but no big deal. She has complains of feeling discomfort at pelvis and she thought it was caused by the IUD. She also has twice near syncope while standing up and while having shower, did not pass out but feeling of about to pass out. BP or glucose was not checked at that time. She also complains of b/l leg weakness, not able to walk as before and right leg "swollen" at the thigh. BP 115/60 in clinic.  REVIEW OF SYSTEMS: Full 14 system review of systems performed and notable only for those listed below and in HPI above, all others are negative:  Constitutional:   Cardiovascular:  Ear/Nose/Throat:   Skin:  Eyes:   Respiratory:   Gastroitestinal:   Genitourinary:  Hematology/Lymphatic:   Endocrine:  Musculoskeletal:   Allergy/Immunology:   Neurological:   Psychiatric:  Sleep:   The following represents the patient's updated allergies and side effects list: Allergies  Allergen Reactions  . Imitrex [Sumatriptan] Other (See Comments)    Unknown Reaction   . Mango Flavor     Nausea and  vomiting  . Oysters [Shellfish Allergy]     Nausea and vomiting    The neurologically relevant items on the patient's problem list were reviewed on today's visit.  Neurologic Examination  A problem focused neurological exam (12 or more points of the single system neurologic examination, vital signs counts as 1 point, cranial nerves count for 8 points) was performed.  Last menstrual period 03/31/2013.  General - obesity, well developed,  in no apparent distress.  Ophthalmologic - Fundi not visualized due to eye movement.  Cardiovascular - irregularly irregular heart rate and rhythm.  Mental Status -  Level of arousal and orientation to time, place, and person were intact. Language including expression, naming, repetition, comprehension was assessed and found intact. Fund of Knowledge was assessed and was intact.  Cranial Nerves II - XII - II - Visual field intact OU. III, IV, VI - Extraocular movements intact. V - Facial sensation intact bilaterally. VII - Facial movement intact bilaterally. VIII - Hearing & vestibular intact bilaterally. X - Palate elevates symmetrically. XI - Chin turning & shoulder shrug intact bilaterally. XII - Tongue protrusion intact.  Motor Strength - The patient's strength was normal in all extremities except left upper extremity 3/5 proximal and 3+/5 distally and pronator drift was absent.  Bulk was normal and fasciculations were absent.   Motor Tone - Muscle tone was assessed at the neck and appendages and was normal.  Reflexes - The patient's reflexes were 1+ in all extremities and she had no pathological reflexes.  Sensory - Light touch, temperature/pinprick were assessed and were normal.    Coordination - The patient had normal movements in the right hand and feet with no ataxia or dysmetria.  Tremor was absent.  Gait and Station - in wheelchair, gait not tested.  Data reviewed: I personally reviewed the images and agree with the radiology interpretations.  Dg Chest 2 View 05/10/2015  No active cardiopulmonary disease.   Ct Head Wo Contrast 05/10/2015  1. Degraded examination without definite acute intracranial process.  2. Similar findings of advanced microvascular ischemic disease.   MRI BRAIN:  05/10/2015  Acute nonhemorrhagic infarct right caudate. Acute nonhemorrhagic infarcts in a parasagittal distribution within the right frontal lobe and anterior aspect of the  right parietal lobe. Remote anterior right frontal lobe/genu of the corpus callosum infarct. Remote small basal ganglia infarcts bilaterally. Moderate small vessel disease type changes. Partial opacification mastoid air cells greater on right. Paranasal sinus mucosal thickening. Expanded partially empty sella. This finding and slightly prominent peri optic spaces can be seen in this setting of pseudotumor cerebri. No flattening of the posterior aspect of the globes.   MRA HEAD  05/10/2015  High-grade stenosis of proximal right middle cerebral artery branches just beyond the bifurcation. Decrease number of visualized right middle cerebral artery branches consistent with patient's acute infarct. Please see above for additional findings.   2-D echo  Left ventricle: The cavity size was normal. Systolic function wasnormal. The estimated ejection fraction was in the range of 55%to 60%. Wall motion was normal; there were no regional wallmotion abnormalities. Doppler parameters are consistent withabnormal left ventricular relaxation (grade 1 diastolicdysfunction).  Carotid Doppler - Bilateral: 1-39% ICA stenosis. Vertebral artery flow is antegrade.  EEG Normal drowsy electroencephalogram. There are no focal lateralizing or epileptiform features.  Component     Latest Ref Rng 05/11/2015  Cholesterol     0 - 200 mg/dL 207 (H)  Triglycerides     <150 mg/dL 75  HDL Cholesterol     >  40 mg/dL 44  Total CHOL/HDL Ratio      4.7  VLDL     0 - 40 mg/dL 15  LDL (calc)     0 - 99 mg/dL 148 (H)  Hemoglobin A1C     4.8 - 5.6 % 6.1 (H)  Mean Plasma Glucose      128    Assessment: As you may recall, she is a 74 y.o. African American female with PMH of atrial fibrillation on aspirin 81 mg daily prior to admission, hypertension, a previous TIA and stroke, hyperlipidemia, diabetes mellitus, and vaginal bleeding was admitted on 05/10/15 for left arm weakness.She was put on ASA last August after a  stroke, however, it was discontinued due to vaginal bleeding. On this admission, OB/GYN considered patient not candidate for hysterectomy, but put pt on megace and agreed with anticoagulation. Patient was put on Eliquis since then. Vaginal bleeding also under control, received IUD placement. She still has left upper extremity weakness and undergoing therapy in SNF. Complain of near syncope x 2.   Plan:  - continue eliquis and lipitor for stroke prevention - continue PT/OT and self active exercise with left arm - increase activity and walk with assistance - check BP, heart rate and glucose if have near syncope symptoms again - follow up with Dr. Loetta Rough about discomfort with IUD - Follow up with your primary care physician for stroke risk factor modification. Recommend maintain blood pressure goal <130/80, diabetes with hemoglobin A1c goal below 6.5% and lipids with LDL cholesterol goal below 70 mg/dL.  - bleeding precautions. - check BP and glucose in facility. - follow up in 3 months.  I spent more than 25 minutes of face to face time with the patient. Greater than 50% of time was spent in counseling and coordination of care.   No orders of the defined types were placed in this encounter.    Meds ordered this encounter  Medications  . bisacodyl (DULCOLAX) 5 MG EC tablet    Sig: Take 5 mg by mouth daily as needed for moderate constipation.  . sennosides-docusate sodium (SENOKOT-S) 8.6-50 MG tablet    Sig: Take 1 tablet by mouth daily.    Patient Instructions  - continue eliquis and lipitor for stroke prevention - need aggressive PT/OT and self active exercise on the left arm - increase activity and walk with help - if have near syncope symptoms again, call nurse and doctor in NH to check BP, heart rate and glucose, and further management if needed - follow up with Dr. Loetta Rough about discomfort with IUD - Follow up with your primary care physician for stroke risk factor modification.  Recommend maintain blood pressure goal <130/80, diabetes with hemoglobin A1c goal below 6.5% and lipids with LDL cholesterol goal below 70 mg/dL.  - bleeding precautions. - check BP and glucose in facility. - follow up in 3 months.    Rosalin Hawking, MD PhD [facility] [address] [place], [place] [address] [phone]

## 2015-11-04 DIAGNOSIS — R2681 Unsteadiness on feet: Secondary | ICD-10-CM | POA: Diagnosis not present

## 2015-11-04 DIAGNOSIS — I639 Cerebral infarction, unspecified: Secondary | ICD-10-CM | POA: Diagnosis not present

## 2015-11-04 DIAGNOSIS — I69954 Hemiplegia and hemiparesis following unspecified cerebrovascular disease affecting left non-dominant side: Secondary | ICD-10-CM | POA: Diagnosis not present

## 2015-11-04 DIAGNOSIS — D509 Iron deficiency anemia, unspecified: Secondary | ICD-10-CM | POA: Diagnosis not present

## 2015-11-04 DIAGNOSIS — R262 Difficulty in walking, not elsewhere classified: Secondary | ICD-10-CM | POA: Diagnosis not present

## 2015-11-04 DIAGNOSIS — M6281 Muscle weakness (generalized): Secondary | ICD-10-CM | POA: Diagnosis not present

## 2015-11-05 DIAGNOSIS — R2681 Unsteadiness on feet: Secondary | ICD-10-CM | POA: Diagnosis not present

## 2015-11-05 DIAGNOSIS — R262 Difficulty in walking, not elsewhere classified: Secondary | ICD-10-CM | POA: Diagnosis not present

## 2015-11-05 DIAGNOSIS — I69954 Hemiplegia and hemiparesis following unspecified cerebrovascular disease affecting left non-dominant side: Secondary | ICD-10-CM | POA: Diagnosis not present

## 2015-11-05 DIAGNOSIS — I639 Cerebral infarction, unspecified: Secondary | ICD-10-CM | POA: Diagnosis not present

## 2015-11-05 DIAGNOSIS — M6281 Muscle weakness (generalized): Secondary | ICD-10-CM | POA: Diagnosis not present

## 2015-11-06 DIAGNOSIS — I69954 Hemiplegia and hemiparesis following unspecified cerebrovascular disease affecting left non-dominant side: Secondary | ICD-10-CM | POA: Diagnosis not present

## 2015-11-06 DIAGNOSIS — R2681 Unsteadiness on feet: Secondary | ICD-10-CM | POA: Diagnosis not present

## 2015-11-06 DIAGNOSIS — I639 Cerebral infarction, unspecified: Secondary | ICD-10-CM | POA: Diagnosis not present

## 2015-11-06 DIAGNOSIS — M6281 Muscle weakness (generalized): Secondary | ICD-10-CM | POA: Diagnosis not present

## 2015-11-06 DIAGNOSIS — R262 Difficulty in walking, not elsewhere classified: Secondary | ICD-10-CM | POA: Diagnosis not present

## 2015-11-07 DIAGNOSIS — M6281 Muscle weakness (generalized): Secondary | ICD-10-CM | POA: Diagnosis not present

## 2015-11-07 DIAGNOSIS — R262 Difficulty in walking, not elsewhere classified: Secondary | ICD-10-CM | POA: Diagnosis not present

## 2015-11-07 DIAGNOSIS — I69954 Hemiplegia and hemiparesis following unspecified cerebrovascular disease affecting left non-dominant side: Secondary | ICD-10-CM | POA: Diagnosis not present

## 2015-11-07 DIAGNOSIS — I639 Cerebral infarction, unspecified: Secondary | ICD-10-CM | POA: Diagnosis not present

## 2015-11-07 DIAGNOSIS — R2681 Unsteadiness on feet: Secondary | ICD-10-CM | POA: Diagnosis not present

## 2015-11-10 DIAGNOSIS — I69954 Hemiplegia and hemiparesis following unspecified cerebrovascular disease affecting left non-dominant side: Secondary | ICD-10-CM | POA: Diagnosis not present

## 2015-11-10 DIAGNOSIS — I639 Cerebral infarction, unspecified: Secondary | ICD-10-CM | POA: Diagnosis not present

## 2015-11-10 DIAGNOSIS — M6281 Muscle weakness (generalized): Secondary | ICD-10-CM | POA: Diagnosis not present

## 2015-11-10 DIAGNOSIS — R2681 Unsteadiness on feet: Secondary | ICD-10-CM | POA: Diagnosis not present

## 2015-11-10 DIAGNOSIS — R262 Difficulty in walking, not elsewhere classified: Secondary | ICD-10-CM | POA: Diagnosis not present

## 2015-11-11 DIAGNOSIS — R2681 Unsteadiness on feet: Secondary | ICD-10-CM | POA: Diagnosis not present

## 2015-11-11 DIAGNOSIS — I69954 Hemiplegia and hemiparesis following unspecified cerebrovascular disease affecting left non-dominant side: Secondary | ICD-10-CM | POA: Diagnosis not present

## 2015-11-11 DIAGNOSIS — I639 Cerebral infarction, unspecified: Secondary | ICD-10-CM | POA: Diagnosis not present

## 2015-11-11 DIAGNOSIS — M6281 Muscle weakness (generalized): Secondary | ICD-10-CM | POA: Diagnosis not present

## 2015-11-11 DIAGNOSIS — R262 Difficulty in walking, not elsewhere classified: Secondary | ICD-10-CM | POA: Diagnosis not present

## 2015-11-11 DIAGNOSIS — D509 Iron deficiency anemia, unspecified: Secondary | ICD-10-CM | POA: Diagnosis not present

## 2015-11-12 DIAGNOSIS — M6281 Muscle weakness (generalized): Secondary | ICD-10-CM | POA: Diagnosis not present

## 2015-11-12 DIAGNOSIS — I69954 Hemiplegia and hemiparesis following unspecified cerebrovascular disease affecting left non-dominant side: Secondary | ICD-10-CM | POA: Diagnosis not present

## 2015-11-12 DIAGNOSIS — R2681 Unsteadiness on feet: Secondary | ICD-10-CM | POA: Diagnosis not present

## 2015-11-12 DIAGNOSIS — R262 Difficulty in walking, not elsewhere classified: Secondary | ICD-10-CM | POA: Diagnosis not present

## 2015-11-12 DIAGNOSIS — I639 Cerebral infarction, unspecified: Secondary | ICD-10-CM | POA: Diagnosis not present

## 2015-11-13 DIAGNOSIS — M542 Cervicalgia: Secondary | ICD-10-CM | POA: Diagnosis not present

## 2015-11-13 DIAGNOSIS — M19012 Primary osteoarthritis, left shoulder: Secondary | ICD-10-CM | POA: Diagnosis not present

## 2015-11-13 DIAGNOSIS — M549 Dorsalgia, unspecified: Secondary | ICD-10-CM | POA: Diagnosis not present

## 2015-11-13 DIAGNOSIS — M47814 Spondylosis without myelopathy or radiculopathy, thoracic region: Secondary | ICD-10-CM | POA: Diagnosis not present

## 2015-11-13 DIAGNOSIS — G8190 Hemiplegia, unspecified affecting unspecified side: Secondary | ICD-10-CM | POA: Diagnosis not present

## 2015-11-13 DIAGNOSIS — W06XXXA Fall from bed, initial encounter: Secondary | ICD-10-CM | POA: Diagnosis not present

## 2015-11-13 DIAGNOSIS — M79602 Pain in left arm: Secondary | ICD-10-CM | POA: Diagnosis not present

## 2015-11-14 DIAGNOSIS — M19012 Primary osteoarthritis, left shoulder: Secondary | ICD-10-CM | POA: Diagnosis not present

## 2015-11-14 DIAGNOSIS — M25561 Pain in right knee: Secondary | ICD-10-CM | POA: Diagnosis not present

## 2015-11-14 DIAGNOSIS — G8190 Hemiplegia, unspecified affecting unspecified side: Secondary | ICD-10-CM | POA: Diagnosis not present

## 2015-11-14 DIAGNOSIS — I10 Essential (primary) hypertension: Secondary | ICD-10-CM | POA: Diagnosis not present

## 2015-11-14 DIAGNOSIS — W06XXXA Fall from bed, initial encounter: Secondary | ICD-10-CM | POA: Diagnosis not present

## 2015-11-18 DIAGNOSIS — D649 Anemia, unspecified: Secondary | ICD-10-CM | POA: Diagnosis not present

## 2015-11-18 DIAGNOSIS — N939 Abnormal uterine and vaginal bleeding, unspecified: Secondary | ICD-10-CM | POA: Diagnosis not present

## 2015-11-18 DIAGNOSIS — N189 Chronic kidney disease, unspecified: Secondary | ICD-10-CM | POA: Diagnosis not present

## 2015-11-18 DIAGNOSIS — I480 Paroxysmal atrial fibrillation: Secondary | ICD-10-CM | POA: Diagnosis not present

## 2015-11-18 DIAGNOSIS — R300 Dysuria: Secondary | ICD-10-CM | POA: Diagnosis not present

## 2015-11-19 ENCOUNTER — Ambulatory Visit (INDEPENDENT_AMBULATORY_CARE_PROVIDER_SITE_OTHER): Payer: Commercial Managed Care - HMO | Admitting: Obstetrics and Gynecology

## 2015-11-19 ENCOUNTER — Encounter: Payer: Self-pay | Admitting: Obstetrics and Gynecology

## 2015-11-19 VITALS — BP 150/90 | HR 90

## 2015-11-19 DIAGNOSIS — N950 Postmenopausal bleeding: Secondary | ICD-10-CM

## 2015-11-19 DIAGNOSIS — Z8673 Personal history of transient ischemic attack (TIA), and cerebral infarction without residual deficits: Secondary | ICD-10-CM

## 2015-11-19 DIAGNOSIS — N8501 Benign endometrial hyperplasia: Secondary | ICD-10-CM

## 2015-11-19 DIAGNOSIS — R938 Abnormal findings on diagnostic imaging of other specified body structures: Secondary | ICD-10-CM | POA: Diagnosis not present

## 2015-11-19 DIAGNOSIS — N939 Abnormal uterine and vaginal bleeding, unspecified: Secondary | ICD-10-CM

## 2015-11-19 NOTE — Progress Notes (Signed)
Patient ID: [name], female   DOB: [birth year], 74 y.o.   MRN: [id number]  LEVEL 5 VISIT DUE TO COMPLEXITY OF visit, paralysis, morbid obesity, and time required for visit.   [facility] Visit  Patient name: [name] MRN [id number]  Date of birth: 1941-06-11  CC & HPI:  [name] is a 74 y.o. female G73P2 with a history of DM, HTN, TIA and CVA presenting today for constant, moderate pelvic pain. Pt was last seen on 11/9 for continued post-menopausal bleeding after IUD placement on 8/11. Per pt's medical chart, she has a history of post-menopausal bleeding, and had hysteroscopy and D&C in April 2016 showing complex and simple hyperplasia without atypia. She also had Endometrial Biopsy in March 2016 which indicated the same. She is s/p CVA with lifetime anticoagulation and having persistent PM bleeding , very light. Pt was determined at that visit to be a severe surgical risk and, therefore, not a candidate for hysterectomy.   ROS:  A complete 10 system review of systems was obtained and all systems are negative except as noted in the HPI and PMH.   Pertinent History Reviewed:   Reviewed: Significant for Hysteroscopy with D&C, endometrial ablation Medical         Past Medical History  Diagnosis Date  . Diabetes mellitus   . Asthma   . Chronic knee pain   . Back pain, chronic   . Hypertension   . Vertigo   . Hyperlipidemia   . TIA (transient ischemic attack)   . Anemia   . HOH (hard of hearing)   . Arthritis   . Obesity   . PMB (postmenopausal bleeding) 11/26/2014  . Urge incontinence 11/26/2014  . Thickened endometrium 01/15/2015  . Dysrhythmia   . Stroke [facility])     Mini Stroke                              Surgical Hx:    Past Surgical History  Procedure Laterality Date  . Appendectomy    . Ovary surgery    . Cataract extraction w/phaco Left 01/16/2013    Procedure: CATARACT EXTRACTION PHACO AND INTRAOCULAR LENS PLACEMENT (IOC);  Surgeon: Elta Guadeloupe T.  Gershon Crane, MD;  Location: [facility];  Service: Ophthalmology;  Laterality: Left;  CDE:14.37  . Endometrial biopsy  04/03/2013       . Hysteroscopy w/d&c N/A 04/24/2013    Procedure: SUCTION DILATATION AND CURETTAGE /HYSTEROSCOPY;  Surgeon: Jonnie Kind, MD;  Location: [facility];  Service: Gynecology;  Laterality: N/A;  . Knee arthroscopy with medial menisectomy Left 07/26/2013    Procedure: KNEE ARTHROSCOPY WITH PARTIAL MEDIAL MENISECTOMY;  Surgeon: Sanjuana Kava, MD;  Location: [facility]S;  Service: Orthopedics;  Laterality: Left;  . Endometrial ablation    . Colonoscopy  2009    Dr. Wilford Corner: 4 hyperplastic polyps removed. Small internal hemorrhoids. Recommended 5 year follow-up colonoscopy.  . Colonoscopy N/A 02/24/2015    Procedure: COLONOSCOPY;  Surgeon: Danie Binder, MD;  Location: [facility];  Service: Endoscopy;  Laterality: N/A;  1030  . Hysteroscopy w/d&c N/A 04/02/2015    Procedure: UTERINE CURETTAGE, HYSTEROSCOPY;  Surgeon: Florian Buff, MD;  Location: [facility];  Service: Gynecology;  Laterality: N/A;   Medications: Reviewed & Updated - see associated section  Current outpatient prescriptions:  .  ACCU-CHEK AVIVA PLUS test strip, , Disp: , Rfl:  .  ACCU-CHEK SOFTCLIX LANCETS lancets, , Disp: , Rfl:  .  acetaminophen (TYLENOL) 325 MG tablet, Take 650 mg by mouth every 6 (six) hours as needed for mild pain. , Disp: , Rfl:  .  Alcohol Swabs (B-D SINGLE USE SWABS REGULAR) PADS, , Disp: , Rfl:  .  apixaban (ELIQUIS) 5 MG TABS tablet, Take 1 tablet (5 mg total) by mouth 2 (two) times daily., Disp: 60 tablet, Rfl: 3 .  atorvastatin (LIPITOR) 80 MG tablet, Take 1 tablet (80 mg total) by mouth daily., Disp: 90 tablet, Rfl: 3 .  bisacodyl (DULCOLAX) 5 MG EC tablet, Take 5 mg by mouth daily as needed for moderate constipation., Disp: , Rfl:  .  Blood Glucose Monitoring Suppl (ACCU-CHEK AVIVA PLUS) W/DEVICE KIT, , Disp: , Rfl:  .  cholecalciferol (VITAMIN D) 1000 UNITS  tablet, Take 1 tablet (1,000 Units total) by mouth every morning., Disp: 30 tablet, Rfl: 1 .  diltiazem (CARDIZEM CD) 180 MG 24 hr capsule, Take 1 capsule (180 mg total) by mouth daily., Disp: 90 capsule, Rfl: 3 .  furosemide (LASIX) 20 MG tablet, Take 40 mg by mouth 2 (two) times daily. , Disp: , Rfl:  .  gabapentin (NEURONTIN) 100 MG capsule, , Disp: , Rfl:  .  ipratropium-albuterol (DUONEB) 0.5-2.5 (3) MG/3ML SOLN, , Disp: , Rfl:  .  KLOR-CON M20 20 MEQ tablet, , Disp: , Rfl:  .  lidocaine (LIDODERM) 5 %, Place 1 patch onto the skin daily. Remove & Discard patch within 12 hours or as directed by MD, Disp: , Rfl:  .  Linaclotide (LINZESS) 145 MCG CAPS capsule, 1 PO 30 mins prior to your first meal (Patient taking differently: Take 145 mcg by mouth daily. ), Disp: 30 capsule, Rfl: 11 .  oxybutynin (DITROPAN-XL) 5 MG 24 hr tablet, , Disp: , Rfl:  .  TOVIAZ 4 MG TB24 tablet, TK 1 T PO D, Disp: , Rfl: 3 .  TRADJENTA 5 MG TABS tablet, Take 5 mg by mouth daily. , Disp: , Rfl: 0 .  VENTOLIN HFA 108 (90 BASE) MCG/ACT inhaler, , Disp: , Rfl:  .  diphenhydrAMINE (BENADRYL) 25 MG tablet, Take 25 mg by mouth every 6 (six) hours as needed. Reported on 11/19/2015, Disp: , Rfl:  .  guaiFENesin (MUCINEX) 600 MG 12 hr tablet, Take by mouth 2 (two) times daily. Reported on 11/19/2015, Disp: , Rfl:  .  sennosides-docusate sodium (SENOKOT-S) 8.6-50 MG tablet, Take 1 tablet by mouth daily., Disp: , Rfl:  .  traMADol (ULTRAM) 50 MG tablet, Take by mouth every 6 (six) hours as needed. Reported on 11/19/2015, Disp: , Rfl:  .  valACYclovir (VALTREX) 1000 MG tablet, Take 1 tablet by mouth daily. Reported on 11/19/2015, Disp: , Rfl:    Social History: Reviewed -  reports that she quit smoking about 6 months ago. Her smoking use included Cigarettes. She has a 7.5 pack-year smoking history. She has never used smokeless tobacco.  Objective Findings:  Vitals: Blood pressure 150/90, pulse 90, last menstrual period  03/31/2013.  Physical Examination: General appearance - alert, well appearing, and in no distress, oriented to person, place, and time and overweight Mental status - alert, oriented to person, place, and time, normal mood, behavior, speech, dress, motor activity, and thought processes Pelvic - normal external genitalia, vulva, vagina, cervix, uterus and adnexa VULVA: normal appearing vulva with no masses, tenderness  or lesions VAGINA: normal appearing vagina with normal color and discharge, no lesions CERVIX: normal appearing cervix without discharge or lesions UTERUS: uterus is normal size, shape, consistency and nontender ADNEXA: normal adnexa in size, nontender and no masses  Assessment & Plan:   A:  1. Pt s/p CVA with lifetime anti-coagulants 2. Post-menopausal bleeding despite IUD placement on 07/17/2015 3. Endometrial biopsy from 02/2015 and hysteroscopy with D&C from 03/2015 showed complex and simple hyperplasia without atypia. 4. [place] today shows persistent thickening of endometrium  P:  1. D&C . Case discussed with Dr Denman George. 2.    By signing my name below, I, Tula Nakayama, attest that this documentation has been prepared under the direction and in the presence of Jonnie Kind, MD. Electronically Signed: Tula Nakayama, ED Scribe. 11/19/2015. 11:01 AM.  I personally performed the services described in this documentation, which was SCRIBED in my presence. The recorded information has been reviewed and considered accurate. It has been edited as necessary during review. Jonnie Kind, MD

## 2015-11-21 DIAGNOSIS — I480 Paroxysmal atrial fibrillation: Secondary | ICD-10-CM | POA: Diagnosis not present

## 2015-11-21 DIAGNOSIS — R9431 Abnormal electrocardiogram [ECG] [EKG]: Secondary | ICD-10-CM | POA: Diagnosis not present

## 2015-11-21 NOTE — Assessment & Plan Note (Addendum)
Persistent postmenopausal bleeding, despite hx D&C and IUD placement, will need repeat D& C as per phone consult DR Denman George, with concurrent IUD placement.

## 2015-11-25 DIAGNOSIS — D509 Iron deficiency anemia, unspecified: Secondary | ICD-10-CM | POA: Diagnosis not present

## 2015-11-28 DIAGNOSIS — R4182 Altered mental status, unspecified: Secondary | ICD-10-CM | POA: Diagnosis not present

## 2015-12-02 DIAGNOSIS — D649 Anemia, unspecified: Secondary | ICD-10-CM | POA: Diagnosis not present

## 2015-12-02 DIAGNOSIS — D509 Iron deficiency anemia, unspecified: Secondary | ICD-10-CM | POA: Diagnosis not present

## 2015-12-02 DIAGNOSIS — N399 Disorder of urinary system, unspecified: Secondary | ICD-10-CM | POA: Diagnosis not present

## 2015-12-02 DIAGNOSIS — N390 Urinary tract infection, site not specified: Secondary | ICD-10-CM | POA: Diagnosis not present

## 2015-12-02 DIAGNOSIS — N189 Chronic kidney disease, unspecified: Secondary | ICD-10-CM | POA: Diagnosis not present

## 2015-12-04 DIAGNOSIS — R4182 Altered mental status, unspecified: Secondary | ICD-10-CM | POA: Diagnosis not present

## 2015-12-04 DIAGNOSIS — E08319 Diabetes mellitus due to underlying condition with unspecified diabetic retinopathy without macular edema: Secondary | ICD-10-CM | POA: Diagnosis not present

## 2015-12-10 ENCOUNTER — Ambulatory Visit: Payer: Commercial Managed Care - HMO | Admitting: Obstetrics and Gynecology

## 2015-12-10 DIAGNOSIS — Z538 Procedure and treatment not carried out for other reasons: Secondary | ICD-10-CM

## 2015-12-10 NOTE — Progress Notes (Signed)
Patient ID: [name], female   DOB: 05-27-1941, 75 y.o.   MRN: [id number] Pt cancelled , will need to schedule at [facility] or refer to oncology

## 2015-12-10 NOTE — Progress Notes (Signed)
HAS APPOINTMENT SCHEDULED FOR 01-14-15 W/ DR Glo Herring

## 2015-12-16 DIAGNOSIS — R4182 Altered mental status, unspecified: Secondary | ICD-10-CM | POA: Diagnosis not present

## 2015-12-22 DIAGNOSIS — M25561 Pain in right knee: Secondary | ICD-10-CM | POA: Diagnosis not present

## 2015-12-22 DIAGNOSIS — I10 Essential (primary) hypertension: Secondary | ICD-10-CM | POA: Diagnosis not present

## 2015-12-22 DIAGNOSIS — Z8709 Personal history of other diseases of the respiratory system: Secondary | ICD-10-CM | POA: Diagnosis not present

## 2015-12-22 DIAGNOSIS — M25562 Pain in left knee: Secondary | ICD-10-CM | POA: Diagnosis not present

## 2015-12-23 DIAGNOSIS — L84 Corns and callosities: Secondary | ICD-10-CM | POA: Diagnosis not present

## 2015-12-23 DIAGNOSIS — D509 Iron deficiency anemia, unspecified: Secondary | ICD-10-CM | POA: Diagnosis not present

## 2015-12-23 DIAGNOSIS — E1151 Type 2 diabetes mellitus with diabetic peripheral angiopathy without gangrene: Secondary | ICD-10-CM | POA: Diagnosis not present

## 2015-12-24 ENCOUNTER — Other Ambulatory Visit (HOSPITAL_COMMUNITY): Payer: Self-pay | Admitting: Internal Medicine

## 2015-12-24 DIAGNOSIS — R300 Dysuria: Secondary | ICD-10-CM | POA: Diagnosis not present

## 2015-12-24 DIAGNOSIS — R4182 Altered mental status, unspecified: Secondary | ICD-10-CM | POA: Diagnosis not present

## 2015-12-24 DIAGNOSIS — F4489 Other dissociative and conversion disorders: Secondary | ICD-10-CM | POA: Diagnosis not present

## 2015-12-24 DIAGNOSIS — R410 Disorientation, unspecified: Secondary | ICD-10-CM

## 2015-12-26 ENCOUNTER — Ambulatory Visit: Payer: Medicare HMO | Admitting: Physical Medicine & Rehabilitation

## 2015-12-26 ENCOUNTER — Ambulatory Visit (HOSPITAL_COMMUNITY): Payer: Medicare HMO

## 2015-12-30 ENCOUNTER — Ambulatory Visit (HOSPITAL_COMMUNITY)
Admission: RE | Admit: 2015-12-30 | Discharge: 2015-12-30 | Disposition: A | Discharge status: Home / Self Care | Payer: Medicare HMO | Source: Ambulatory Visit | Attending: Internal Medicine | Admitting: Internal Medicine

## 2015-12-30 DIAGNOSIS — I6782 Cerebral ischemia: Secondary | ICD-10-CM | POA: Diagnosis not present

## 2015-12-30 DIAGNOSIS — E119 Type 2 diabetes mellitus without complications: Secondary | ICD-10-CM | POA: Diagnosis not present

## 2015-12-30 DIAGNOSIS — R4182 Altered mental status, unspecified: Secondary | ICD-10-CM | POA: Diagnosis not present

## 2015-12-30 DIAGNOSIS — G9389 Other specified disorders of brain: Secondary | ICD-10-CM | POA: Insufficient documentation

## 2015-12-30 DIAGNOSIS — D509 Iron deficiency anemia, unspecified: Secondary | ICD-10-CM | POA: Diagnosis not present

## 2015-12-30 DIAGNOSIS — Z8673 Personal history of transient ischemic attack (TIA), and cerebral infarction without residual deficits: Secondary | ICD-10-CM | POA: Insufficient documentation

## 2015-12-30 DIAGNOSIS — R410 Disorientation, unspecified: Secondary | ICD-10-CM

## 2016-01-01 ENCOUNTER — Emergency Department (HOSPITAL_COMMUNITY): Payer: Medicare HMO

## 2016-01-01 ENCOUNTER — Emergency Department (HOSPITAL_COMMUNITY)
Admission: EM | Admit: 2016-01-01 | Discharge: 2016-01-01 | Disposition: A | Discharge status: Home / Self Care | Payer: Medicare HMO | Attending: Emergency Medicine | Admitting: Emergency Medicine

## 2016-01-01 ENCOUNTER — Encounter (HOSPITAL_COMMUNITY): Payer: Self-pay | Admitting: *Deleted

## 2016-01-01 DIAGNOSIS — J45901 Unspecified asthma with (acute) exacerbation: Secondary | ICD-10-CM | POA: Insufficient documentation

## 2016-01-01 DIAGNOSIS — E669 Obesity, unspecified: Secondary | ICD-10-CM | POA: Diagnosis not present

## 2016-01-01 DIAGNOSIS — M1990 Unspecified osteoarthritis, unspecified site: Secondary | ICD-10-CM | POA: Diagnosis not present

## 2016-01-01 DIAGNOSIS — I10 Essential (primary) hypertension: Secondary | ICD-10-CM | POA: Diagnosis not present

## 2016-01-01 DIAGNOSIS — Z8673 Personal history of transient ischemic attack (TIA), and cerebral infarction without residual deficits: Secondary | ICD-10-CM | POA: Insufficient documentation

## 2016-01-01 DIAGNOSIS — Z7901 Long term (current) use of anticoagulants: Secondary | ICD-10-CM | POA: Diagnosis not present

## 2016-01-01 DIAGNOSIS — R0682 Tachypnea, not elsewhere classified: Secondary | ICD-10-CM | POA: Diagnosis not present

## 2016-01-01 DIAGNOSIS — E785 Hyperlipidemia, unspecified: Secondary | ICD-10-CM | POA: Diagnosis not present

## 2016-01-01 DIAGNOSIS — R079 Chest pain, unspecified: Secondary | ICD-10-CM

## 2016-01-01 DIAGNOSIS — E119 Type 2 diabetes mellitus without complications: Secondary | ICD-10-CM | POA: Insufficient documentation

## 2016-01-01 DIAGNOSIS — Z7401 Bed confinement status: Secondary | ICD-10-CM | POA: Diagnosis not present

## 2016-01-01 DIAGNOSIS — Z8742 Personal history of other diseases of the female genital tract: Secondary | ICD-10-CM | POA: Diagnosis not present

## 2016-01-01 DIAGNOSIS — Z862 Personal history of diseases of the blood and blood-forming organs and certain disorders involving the immune mechanism: Secondary | ICD-10-CM | POA: Insufficient documentation

## 2016-01-01 DIAGNOSIS — I482 Chronic atrial fibrillation, unspecified: Secondary | ICD-10-CM

## 2016-01-01 DIAGNOSIS — H02845 Edema of left lower eyelid: Secondary | ICD-10-CM | POA: Insufficient documentation

## 2016-01-01 DIAGNOSIS — Z87891 Personal history of nicotine dependence: Secondary | ICD-10-CM | POA: Diagnosis not present

## 2016-01-01 DIAGNOSIS — G8929 Other chronic pain: Secondary | ICD-10-CM | POA: Insufficient documentation

## 2016-01-01 DIAGNOSIS — Z79899 Other long term (current) drug therapy: Secondary | ICD-10-CM | POA: Insufficient documentation

## 2016-01-01 DIAGNOSIS — R0789 Other chest pain: Secondary | ICD-10-CM | POA: Diagnosis not present

## 2016-01-01 DIAGNOSIS — R402411 Glasgow coma scale score 13-15, in the field [EMT or ambulance]: Secondary | ICD-10-CM | POA: Diagnosis not present

## 2016-01-01 DIAGNOSIS — R61 Generalized hyperhidrosis: Secondary | ICD-10-CM | POA: Diagnosis not present

## 2016-01-01 DIAGNOSIS — H9190 Unspecified hearing loss, unspecified ear: Secondary | ICD-10-CM | POA: Diagnosis not present

## 2016-01-01 DIAGNOSIS — R000 Tachycardia, unspecified: Secondary | ICD-10-CM | POA: Diagnosis not present

## 2016-01-01 DIAGNOSIS — I959 Hypotension, unspecified: Secondary | ICD-10-CM | POA: Diagnosis not present

## 2016-01-01 DIAGNOSIS — J811 Chronic pulmonary edema: Secondary | ICD-10-CM | POA: Diagnosis not present

## 2016-01-01 LAB — COMPREHENSIVE METABOLIC PANEL
ALT: 23 U/L (ref 14–54)
AST: 21 U/L (ref 15–41)
Albumin: 3.4 g/dL — ABNORMAL LOW (ref 3.5–5.0)
Alkaline Phosphatase: 55 U/L (ref 38–126)
Anion gap: 9 (ref 5–15)
BUN: 15 mg/dL (ref 6–20)
CO2: 27 mmol/L (ref 22–32)
Calcium: 9.3 mg/dL (ref 8.9–10.3)
Chloride: 108 mmol/L (ref 101–111)
Creatinine, Ser: 1.05 mg/dL — ABNORMAL HIGH (ref 0.44–1.00)
GFR calc Af Amer: 60 mL/min — ABNORMAL LOW (ref >60)
GFR calc non Af Amer: 52 mL/min — ABNORMAL LOW (ref >60)
Glucose, Bld: 105 mg/dL — ABNORMAL HIGH (ref 65–99)
Potassium: 3.5 mmol/L (ref 3.5–5.1)
Sodium: 144 mmol/L (ref 135–145)
Total Bilirubin: 0.9 mg/dL (ref 0.3–1.2)
Total Protein: 6.9 g/dL (ref 6.5–8.1)

## 2016-01-01 LAB — CBC WITH DIFFERENTIAL/PLATELET
Basophils Absolute: 0 10*3/uL (ref 0.0–0.1)
Basophils Relative: 0 %
Eosinophils Absolute: 0 10*3/uL (ref 0.0–0.7)
Eosinophils Relative: 1 %
HCT: 41.3 % (ref 36.0–46.0)
Hemoglobin: 13.9 g/dL (ref 12.0–15.0)
Lymphocytes Relative: 32 %
Lymphs Abs: 1.5 10*3/uL (ref 0.7–4.0)
MCH: 33.7 pg (ref 26.0–34.0)
MCHC: 33.7 g/dL (ref 30.0–36.0)
MCV: 100.2 fL — ABNORMAL HIGH (ref 78.0–100.0)
Monocytes Absolute: 0.5 10*3/uL (ref 0.1–1.0)
Monocytes Relative: 11 %
Neutro Abs: 2.6 10*3/uL (ref 1.7–7.7)
Neutrophils Relative %: 56 %
Platelets: 191 10*3/uL (ref 150–400)
RBC: 4.12 MIL/uL (ref 3.87–5.11)
RDW: 13.6 % (ref 11.5–15.5)
WBC: 4.6 10*3/uL (ref 4.0–10.5)

## 2016-01-01 LAB — TROPONIN I
Troponin I: <0.03 ng/mL (ref <0.031)
Troponin I: <0.03 ng/mL (ref <0.031)

## 2016-01-01 LAB — CBG MONITORING, ED: Glucose-Capillary: 111 mg/dL — ABNORMAL HIGH (ref 65–99)

## 2016-01-01 LAB — BRAIN NATRIURETIC PEPTIDE: B Natriuretic Peptide: 124 pg/mL — ABNORMAL HIGH (ref 0.0–100.0)

## 2016-01-01 MED ORDER — DILTIAZEM HCL 50 MG/10ML IV SOLN
10.0000 mg | Freq: Once | INTRAVENOUS | Status: AC
  Administered 2016-01-01: 10 mg via INTRAVENOUS

## 2016-01-01 MED ORDER — DILTIAZEM HCL 25 MG/5ML IV SOLN
10.0000 mg | Freq: Once | INTRAVENOUS | Status: AC
  Administered 2016-01-01: 10 mg via INTRAVENOUS
  Filled 2016-01-01: qty 5

## 2016-01-01 MED ORDER — DILTIAZEM HCL 25 MG/5ML IV SOLN
INTRAVENOUS | Status: AC
  Filled 2016-01-01: qty 5

## 2016-01-01 MED ORDER — DILTIAZEM HCL ER COATED BEADS 180 MG PO CP24
180.0000 mg | ORAL_CAPSULE | Freq: Every day | ORAL | Status: DC
  Administered 2016-01-01: 180 mg via ORAL
  Filled 2016-01-01 (×2): qty 1

## 2016-01-01 NOTE — ED Provider Notes (Signed)
CSN: [id number]     Arrival date & time 01/01/16  0549 History   First MD Initiated Contact with Patient 01/01/16 [phone]   Chief Complaint  Patient presents with  . Chest Pain   Level V caveat for poor historian due to prior stroke  (Consider location/radiation/quality/duration/timing/severity/associated sxs/prior Treatment) HPI   Patient presents from her nursing home. She states about 6:30 this morning processes it's only 6 AM now in (she was watching TV and she had some left-sided chest pain that she described as sharp. She states it lasted a few minutes and went away. She states it came back again and only lasted 2-3 seconds. She states yesterday her lunch was very salty and she had to send it back, she states now she feels like chest some swelling of her left lower eyelid. She denies any itching. She states she has chronic shortness of breath, she had some nausea and vomiting yesterday afternoon but not this morning. She's had some midabdominal pain for a few days. She states she uses nebulizers when necessary for wheezing but she doesn't feel like she needs one now. She states currently she feels tired. During our interview she states of their goes again she had another sharp pain.  Patient states she had her right knee injected with steroids recently. Also she has been evaluated by GYN for vaginal bleeding. She has a history of atrial fibrillation that is paroxysmal and she was not put on anticoagulants because of her vaginal bleeding. However she has had a IUD inserted and she now is currently on Eliquis per Dr Nelly Laurence note in October 2015.   PCP Dr Mal Amabile at the [facility] GYN Dr Elon Alas Dr Luna Glasgow  Past Medical History  Diagnosis Date  . Diabetes mellitus   . Asthma   . Chronic knee pain   . Back pain, chronic   . Hypertension   . Vertigo   . Hyperlipidemia   . TIA (transient ischemic attack)   . Anemia   . HOH (hard of hearing)   . Arthritis   . Obesity   . PMB  (postmenopausal bleeding) 11/26/2014  . Urge incontinence 11/26/2014  . Thickened endometrium 01/15/2015  . Dysrhythmia   . Stroke [facility])     Mini Stroke   Past Surgical History  Procedure Laterality Date  . Appendectomy    . Ovary surgery    . Cataract extraction w/phaco Left 01/16/2013    Procedure: CATARACT EXTRACTION PHACO AND INTRAOCULAR LENS PLACEMENT (IOC);  Surgeon: Elta Guadeloupe T. Gershon Crane, MD;  Location: [facility];  Service: Ophthalmology;  Laterality: Left;  CDE:14.37  . Endometrial biopsy  04/03/2013       . Hysteroscopy w/d&c N/A 04/24/2013    Procedure: SUCTION DILATATION AND CURETTAGE /HYSTEROSCOPY;  Surgeon: Jonnie Kind, MD;  Location: [facility];  Service: Gynecology;  Laterality: N/A;  . Knee arthroscopy with medial menisectomy Left 07/26/2013    Procedure: KNEE ARTHROSCOPY WITH PARTIAL MEDIAL MENISECTOMY;  Surgeon: Sanjuana Kava, MD;  Location: [facility];  Service: Orthopedics;  Laterality: Left;  . Endometrial ablation    . Colonoscopy  2009    Dr. Wilford Corner: 4 hyperplastic polyps removed. Small internal hemorrhoids. Recommended 5 year follow-up colonoscopy.  . Colonoscopy N/A 02/24/2015    Procedure: COLONOSCOPY;  Surgeon: Danie Binder, MD;  Location: [facility];  Service: Endoscopy;  Laterality: N/A;  1030  . Hysteroscopy w/d&c N/A 04/02/2015    Procedure: UTERINE CURETTAGE, HYSTEROSCOPY;  Surgeon: Florian Buff, MD;  Location:  [facility];  Service: Gynecology;  Laterality: N/A;   Family History  Problem Relation Age of Onset  . Arrhythmia Father     Atrial fib/Pacemaker  . Heart failure Mother   . Diabetes Mother   . Other Mother     fell and broke hip  . Diabetes Sister   . Lupus Daughter   . Mental illness Son   . ADD / ADHD Son   . Other Son     soft bones  . Diabetes Maternal Grandmother   . Stroke Paternal Grandmother   . Arthritis Paternal Grandfather   . Other Sister     MVA  . Hypertension Brother   . Diabetes Sister   . Hypertension Sister   . Colon  cancer Neg Hx    Social History  Substance Use Topics  . Smoking status: Former Smoker -- 0.25 packs/day for 30 years    Types: Cigarettes    Quit date: 05/20/2015  . Smokeless tobacco: Never Used  . Alcohol Use: No   Lives in NH Uses a wheelchair Quit smoking in June  OB History    Gravida Para Term Preterm AB TAB SAB Ectopic Multiple Living   _0 Review of Systems  All other systems reviewed and are negative.     Allergies  Imitrex; Mango flavor; and Oysters  Home Medications   Prior to Admission medications   Medication Sig Start Date End Date Taking? Authorizing Provider  ACCU-CHEK AVIVA PLUS test strip  05/23/15   Historical Provider, MD  ACCU-CHEK SOFTCLIX LANCETS lancets  05/23/15   Historical Provider, MD  acetaminophen (TYLENOL) 325 MG tablet Take 650 mg by mouth every 6 (six) hours as needed for mild pain.     Historical Provider, MD  Alcohol Swabs (B-D SINGLE USE SWABS REGULAR) PADS  05/23/15   Historical Provider, MD  apixaban (ELIQUIS) 5 MG TABS tablet Take 1 tablet (5 mg total) by mouth 2 (two) times daily. 05/21/15   Lavon Paganini Angiulli, PA-C  atorvastatin (LIPITOR) 80 MG tablet Take 1 tablet (80 mg total) by mouth daily. 09/19/15   Arnoldo Lenis, MD  bisacodyl (DULCOLAX) 5 MG EC tablet Take 5 mg by mouth daily as needed for moderate constipation.    Historical Provider, MD  Blood Glucose Monitoring Suppl (ACCU-CHEK AVIVA PLUS) W/DEVICE KIT  05/23/15   Historical Provider, MD  cholecalciferol (VITAMIN D) 1000 UNITS tablet Take 1 tablet (1,000 Units total) by mouth every morning. 05/21/15   Lavon Paganini Angiulli, PA-C  diltiazem (CARDIZEM CD) 180 MG 24 hr capsule Take 1 capsule (180 mg total) by mouth daily. 09/19/15   Arnoldo Lenis, MD  diphenhydrAMINE (BENADRYL) 25 MG tablet Take 25 mg by mouth every 6 (six) hours as needed. Reported on 11/19/2015    Historical Provider, MD  furosemide (LASIX) 20 MG tablet Take 40 mg by mouth 2 (two) times daily.   07/15/15   Historical Provider, MD  gabapentin (NEURONTIN) 100 MG capsule  07/09/15   Historical Provider, MD  guaiFENesin (MUCINEX) 600 MG 12 hr tablet Take by mouth 2 (two) times daily. Reported on 11/19/2015    Historical Provider, MD  ipratropium-albuterol (DUONEB) 0.5-2.5 (3) MG/3ML SOLN  10/24/15   Historical Provider, MD  KLOR-CON M20 20 MEQ tablet  07/18/15   Historical Provider, MD  lidocaine (LIDODERM) 5 % Place 1 patch onto the skin daily. Remove & Discard patch within 12 hours  or as directed by MD    Historical Provider, MD  Linaclotide (LINZESS) 145 MCG CAPS capsule 1 PO 30 mins prior to your first meal Patient taking differently: Take 145 mcg by mouth daily.  05/09/15   Carlis Stable, NP  oxybutynin (DITROPAN-XL) 5 MG 24 hr tablet  09/15/15   Historical Provider, MD  sennosides-docusate sodium (SENOKOT-S) 8.6-50 MG tablet Take 1 tablet by mouth daily.    Historical Provider, MD  TOVIAZ 4 MG TB24 tablet TK 1 T PO D 05/09/15   Historical Provider, MD  TRADJENTA 5 MG TABS tablet Take 5 mg by mouth daily.  02/26/15   Historical Provider, MD  traMADol (ULTRAM) 50 MG tablet Take by mouth every 6 (six) hours as needed. Reported on 11/19/2015    Historical Provider, MD  valACYclovir (VALTREX) 1000 MG tablet Take 1 tablet by mouth daily. Reported on 11/19/2015 10/09/15   Historical Provider, MD  VENTOLIN HFA 108 (90 BASE) MCG/ACT inhaler  07/05/15   Historical Provider, MD   BP 133/105 mmHg  Pulse 42  Resp 26  SpO2 95%  LMP 03/31/2013  Vital signs normal except for possible bradycardia however that was not evident on her EKG or on the monitor during my exam   Physical Exam  Constitutional: She is oriented to person, place, and time. She appears well-developed and well-nourished.  Non-toxic appearance. She does not appear ill. No distress.  HENT:  Head: Normocephalic and atraumatic.  Right Ear: External ear normal.  Left Ear: External ear normal.  Nose: Nose normal. No mucosal edema or  rhinorrhea.  Mouth/Throat: Oropharynx is clear and moist and mucous membranes are normal. No dental abscesses or uvula swelling.  Eyes: Conjunctivae and EOM are normal. Pupils are equal, round, and reactive to light.  Neck: Normal range of motion and full passive range of motion without pain. Neck supple.  Cardiovascular: Normal rate and normal heart sounds.  An irregularly irregular rhythm present. Exam reveals no gallop and no friction rub.   No murmur heard. Heart rate varies between 90 and 110 on the monitor  Pulmonary/Chest: Effort normal and breath sounds normal. No respiratory distress. She has no wheezes. She has no rhonchi. She has no rales. She exhibits no tenderness and no crepitus.  Abdominal: Soft. Normal appearance and bowel sounds are normal. She exhibits no distension. There is no tenderness. There is no rebound and no guarding.  Musculoskeletal: Normal range of motion. She exhibits no edema or tenderness.  Moves all extremities well.   Neurological: She is alert and oriented to person, place, and time. She has normal strength. No cranial nerve deficit.  Skin: Skin is warm, dry and intact. No rash noted. No erythema. No pallor.  Psychiatric: She has a normal mood and affect. Her speech is normal and behavior is normal. Her mood appears not anxious.  Nursing note and vitals reviewed.   ED Course  Procedures (including critical care time)  Medications  diltiazem (CARDIZEM) injection SOLN 10 mg (0 mg Intravenous Stopped 01/01/16 0643)   Patient was given diltiazem IV bolus for her atrial fibrillation to control her rate.  07:20 daughter is here. States her mother cannot give clear history. We will do a delta troponin to evaluate  her chest pain. Pt has not had any more chest pain.   07:40 pt left with Dr Dolly Rias to get her delta troponin results.    Review of Dr. Devra Dopp cardiology note states patient is only having intermittent episodes of palpitations. His  note states she  is on Eliquis and no EKG was described. ? Chronic afib vs paroxysmal Afib.   Labs Review Results for orders placed or performed during the hospital encounter of 01/01/16  Comprehensive metabolic panel  Result Value Ref Range   Sodium 144 135 - 145 mmol/L   Potassium 3.5 3.5 - 5.1 mmol/L   Chloride 108 101 - 111 mmol/L   CO2 27 22 - 32 mmol/L   Glucose, Bld 105 (H) 65 - 99 mg/dL   BUN 15 6 - 20 mg/dL   Creatinine, Ser 1.05 (H) 0.44 - 1.00 mg/dL   Calcium 9.3 8.9 - 10.3 mg/dL   Total Protein 6.9 6.5 - 8.1 g/dL   Albumin 3.4 (L) 3.5 - 5.0 g/dL   AST 21 15 - 41 U/L   ALT 23 14 - 54 U/L   Alkaline Phosphatase 55 38 - 126 U/L   Total Bilirubin 0.9 0.3 - 1.2 mg/dL   GFR calc non Af Amer 52 (L) >60 mL/min   GFR calc Af Amer 60 (L) >60 mL/min   Anion gap 9 5 - 15  CBC with Differential  Result Value Ref Range   WBC 4.6 4.0 - 10.5 K/uL   RBC 4.12 3.87 - 5.11 MIL/uL   Hemoglobin 13.9 12.0 - 15.0 g/dL   HCT 41.3 36.0 - 46.0 %   MCV 100.2 (H) 78.0 - 100.0 fL   MCH 33.7 26.0 - 34.0 pg   MCHC 33.7 30.0 - 36.0 g/dL   RDW 13.6 11.5 - 15.5 %   Platelets 191 150 - 400 K/uL   Neutrophils Relative % 56 %   Neutro Abs 2.6 1.7 - 7.7 K/uL   Lymphocytes Relative 32 %   Lymphs Abs 1.5 0.7 - 4.0 K/uL   Monocytes Relative 11 %   Monocytes Absolute 0.5 0.1 - 1.0 K/uL   Eosinophils Relative 1 %   Eosinophils Absolute 0.0 0.0 - 0.7 K/uL   Basophils Relative 0 %   Basophils Absolute 0.0 0.0 - 0.1 K/uL  Troponin I  Result Value Ref Range   Troponin I <0.03 <0.031 ng/mL    Laboratory interpretation all normal    Imaging Review Dg Chest Port 1 View  01/01/2016  CLINICAL DATA:  Acute onset of generalized chest pain, tachycardia and high blood pressure. Initial encounter. EXAM: PORTABLE CHEST 1 VIEW COMPARISON:  Chest radiograph performed 05/27/2015 FINDINGS: The lungs are well-aerated. Mild vascular congestion is noted. There is no evidence of focal opacification, pleural effusion or pneumothorax.  The cardiomediastinal silhouette is mildly enlarged. Right paratracheal prominence is stable from multiple studies dating back to 2005, and likely reflects normal vasculature. No acute osseous abnormalities are seen. IMPRESSION: Mild vascular congestion and mild cardiomegaly noted. Lungs remain grossly clear. Electronically Signed   By: Garald Balding M.D.   On: 01/01/2016 06:55   I have personally reviewed and evaluated these images and lab results as part of my medical decision-making.   EKG Interpretation   Date/Time:  Thursday January 01 2016 05:59:41 EST Ventricular Rate:  134 PR Interval:    QRS Duration: 83 QT Interval:  301 QTC Calculation: 449 R Axis:   62 Text Interpretation:  Atrial fibrillation Minimal ST depression, diffuse  leads Since last tracing 27 May 2015 Atrial fibrillation has replaced  Normal sinus rhythm Confirmed by Porchea Charrier  MD-I, Salisa Broz ([id number]) on 01/01/2016  6:03:15 AM      MDM   Final diagnoses:  Chest pain, unspecified chest pain type  Chronic atrial fibrillation ([place])    Disposition   pending    Rolland Porter, MD 01/01/16 [phone]

## 2016-01-01 NOTE — Discharge Instructions (Signed)
HOLD TODAYS' DOSE OF DILTIAZEM, RESUME OTHER MEDICATIONS AS SCHEDULED, MAY GIVE DILTIAZEM AS SCHEDULED TOMORROW.

## 2016-01-01 NOTE — ED Notes (Signed)
Report attempted. Center to call back.

## 2016-01-01 NOTE — ED Notes (Signed)
Pt is a resident of [facility] and arrived to department via EMS.  C/O left sided chest pain that began about an hour ago.  Pt reports pain has improved at this time.  Pt has history of cardiac arhythmia.  No symptoms at present time.

## 2016-01-01 NOTE — ED Notes (Signed)
Nursing at [facility] updated on pt's status at this time.

## 2016-01-01 NOTE — ED Provider Notes (Signed)
12:59 PM Assumed care from Dr. Tomi Bamberger, please see their note for full history, physical and decision making until this point. In brief this is a 75 y.o. year old female who presented to the ED tonight with Chest Pain     Here with atypical chest pain, awaiting delta troponin.   On reevaluation, still CP free, appears comfortable, in afib  With increasing rate. On diltiazem at home, will give another IV dose and give home dose as well.   Delta troponins negative, atypical story for ACS, will dc to follow up with her cardiologist. HR improved with iv and po dilt, stable for dc. Her facility contacted and will not give home diltiazem today but will tomorrow.   Discussed with patient and daughter at bedside, all questions answered.   Discharge instructions, including strict return precautions for new or worsening symptoms, given. Patient and daughter verbalized understanding and agreement with the plan as described.   Labs, studies and imaging reviewed by myself and considered in medical decision making if ordered. Imaging interpreted by radiology.  Labs Reviewed  COMPREHENSIVE METABOLIC PANEL - Abnormal; Notable for the following:    Glucose, Bld 105 (*)    Creatinine, Ser 1.05 (*)    Albumin 3.4 (*)    GFR calc non Af Amer 52 (*)    GFR calc Af Amer 60 (*)    All other components within normal limits  CBC WITH DIFFERENTIAL/PLATELET - Abnormal; Notable for the following:    MCV 100.2 (*)    All other components within normal limits  BRAIN NATRIURETIC PEPTIDE - Abnormal; Notable for the following:    B Natriuretic Peptide 124.0 (*)    All other components within normal limits  TROPONIN I  TROPONIN I    DG Chest Port 1 View  Final Result      No Follow-up on file.   Merrily Pew, MD 01/01/16 1302

## 2016-01-01 NOTE — ED Notes (Signed)
EMS called for patient transport to [facility].

## 2016-01-01 NOTE — ED Notes (Signed)
Waiting EMS to transport to [facility].

## 2016-01-05 ENCOUNTER — Encounter: Payer: Self-pay | Admitting: Physical Medicine & Rehabilitation

## 2016-01-05 ENCOUNTER — Encounter: Payer: Medicare HMO | Attending: Physical Medicine & Rehabilitation

## 2016-01-05 ENCOUNTER — Ambulatory Visit (HOSPITAL_BASED_OUTPATIENT_CLINIC_OR_DEPARTMENT_OTHER): Payer: Medicare HMO | Admitting: Physical Medicine & Rehabilitation

## 2016-01-05 VITALS — BP 150/96 | HR 72 | Temp 98.4°F

## 2016-01-05 DIAGNOSIS — I69854 Hemiplegia and hemiparesis following other cerebrovascular disease affecting left non-dominant side: Secondary | ICD-10-CM | POA: Diagnosis not present

## 2016-01-05 DIAGNOSIS — M79642 Pain in left hand: Secondary | ICD-10-CM | POA: Insufficient documentation

## 2016-01-05 DIAGNOSIS — G8110 Spastic hemiplegia affecting unspecified side: Secondary | ICD-10-CM | POA: Diagnosis not present

## 2016-01-05 DIAGNOSIS — E119 Type 2 diabetes mellitus without complications: Secondary | ICD-10-CM | POA: Diagnosis not present

## 2016-01-05 DIAGNOSIS — E785 Hyperlipidemia, unspecified: Secondary | ICD-10-CM | POA: Diagnosis not present

## 2016-01-05 DIAGNOSIS — I10 Essential (primary) hypertension: Secondary | ICD-10-CM | POA: Insufficient documentation

## 2016-01-05 DIAGNOSIS — M1990 Unspecified osteoarthritis, unspecified site: Secondary | ICD-10-CM | POA: Insufficient documentation

## 2016-01-05 NOTE — Progress Notes (Signed)
Botox Injection for spasticity using needle EMG guidance  Dilution: 50 Units/ml Indication: Severe spasticity which interferes with ADL,mobility and/or  hygiene and is unresponsive to medication management and other conservative care Informed consent was obtained after describing risks and benefits of the procedure with the patient. This includes bleeding, bruising, infection, excessive weakness, or medication side effects. A REMS form is on file and signed. Needle: 25g 2" needle electrode Number of units per muscle  Biceps100  FDS25 FDP25 Pronator teres 50 All injections were done after obtaining appropriate EMG activity and after negative drawback for blood. The patient tolerated the procedure well. Post procedure instructions were given. A followup appointment was made.

## 2016-01-05 NOTE — Patient Instructions (Signed)

## 2016-01-06 DIAGNOSIS — R4182 Altered mental status, unspecified: Secondary | ICD-10-CM | POA: Diagnosis not present

## 2016-01-06 DIAGNOSIS — I639 Cerebral infarction, unspecified: Secondary | ICD-10-CM | POA: Diagnosis not present

## 2016-01-08 DIAGNOSIS — I639 Cerebral infarction, unspecified: Secondary | ICD-10-CM | POA: Diagnosis not present

## 2016-01-08 DIAGNOSIS — I69954 Hemiplegia and hemiparesis following unspecified cerebrovascular disease affecting left non-dominant side: Secondary | ICD-10-CM | POA: Diagnosis not present

## 2016-01-08 DIAGNOSIS — R2681 Unsteadiness on feet: Secondary | ICD-10-CM | POA: Diagnosis not present

## 2016-01-08 DIAGNOSIS — M6281 Muscle weakness (generalized): Secondary | ICD-10-CM | POA: Diagnosis not present

## 2016-01-08 DIAGNOSIS — R262 Difficulty in walking, not elsewhere classified: Secondary | ICD-10-CM | POA: Diagnosis not present

## 2016-01-09 DIAGNOSIS — M6281 Muscle weakness (generalized): Secondary | ICD-10-CM | POA: Diagnosis not present

## 2016-01-09 DIAGNOSIS — R2681 Unsteadiness on feet: Secondary | ICD-10-CM | POA: Diagnosis not present

## 2016-01-09 DIAGNOSIS — I639 Cerebral infarction, unspecified: Secondary | ICD-10-CM | POA: Diagnosis not present

## 2016-01-09 DIAGNOSIS — R262 Difficulty in walking, not elsewhere classified: Secondary | ICD-10-CM | POA: Diagnosis not present

## 2016-01-09 DIAGNOSIS — I69954 Hemiplegia and hemiparesis following unspecified cerebrovascular disease affecting left non-dominant side: Secondary | ICD-10-CM | POA: Diagnosis not present

## 2016-01-13 DIAGNOSIS — R4182 Altered mental status, unspecified: Secondary | ICD-10-CM | POA: Diagnosis not present

## 2016-01-15 ENCOUNTER — Ambulatory Visit (INDEPENDENT_AMBULATORY_CARE_PROVIDER_SITE_OTHER): Payer: Commercial Managed Care - HMO | Admitting: Obstetrics and Gynecology

## 2016-01-15 VITALS — BP 140/82 | Ht 61.0 in

## 2016-01-15 DIAGNOSIS — N939 Abnormal uterine and vaginal bleeding, unspecified: Secondary | ICD-10-CM

## 2016-01-15 DIAGNOSIS — N8501 Benign endometrial hyperplasia: Secondary | ICD-10-CM

## 2016-01-15 NOTE — Progress Notes (Signed)
Patient ID: [name], female   DOB: May 28, 1941, 75 y.o.   MRN: [id number]   [facility] Visit  Patient name: [name] MRN [id number]  Date of birth: 1941/10/07  CC & HPI:  [name] is a 75 y.o. female presenting today for reassessment of postmeno BLEEDING ASSOCIATED WITH SIMPLE AND COMPLEX ENDOMETRIAL HYPERPLASIA WITHOUT ATYPIA ON FULL D&C DONE LAST April At last visit the patient had rather heavy bleeding but that has now become only lite occasional spotting.  ROS:  i have reviewed the case from Dr Denman George, [facility] due to the multiple comorbidities, and since pt is on continuous antiicoagulant, it may not be possible to completely eliminate the PMB/ Since the IUD now seems to be keeping the PMB under adequate control, I feel following for now is more appropriate. We will reassess in 6 months with u/s and review of bleeding.   Pertinent History Reviewed:   Reviewed: Significant for lifetime anticoagulation, also has Mirena IUD in place since October. Medical         Past Medical History  Diagnosis Date  . Diabetes mellitus   . Asthma   . Chronic knee pain   . Back pain, chronic   . Hypertension   . Vertigo   . Hyperlipidemia   . TIA (transient ischemic attack)   . Anemia   . HOH (hard of hearing)   . Arthritis   . Obesity   . PMB (postmenopausal bleeding) 11/26/2014  . Urge incontinence 11/26/2014  . Thickened endometrium 01/15/2015  . Dysrhythmia   . Stroke [facility])     Mini Stroke                              Surgical Hx:    Past Surgical History  Procedure Laterality Date  . Appendectomy    . Ovary surgery    . Cataract extraction w/phaco Left 01/16/2013    Procedure: CATARACT EXTRACTION PHACO AND INTRAOCULAR LENS PLACEMENT (IOC);  Surgeon: Elta Guadeloupe T. Gershon Crane, MD;  Location: [facility];  Service: Ophthalmology;  Laterality: Left;  CDE:14.37  . Endometrial biopsy  04/03/2013       . Hysteroscopy w/d&c N/A 04/24/2013    Procedure: SUCTION  DILATATION AND CURETTAGE /HYSTEROSCOPY;  Surgeon: Jonnie Kind, MD;  Location: [facility];  Service: Gynecology;  Laterality: N/A;  . Knee arthroscopy with medial menisectomy Left 07/26/2013    Procedure: KNEE ARTHROSCOPY WITH PARTIAL MEDIAL MENISECTOMY;  Surgeon: Sanjuana Kava, MD;  Location: [facility];  Service: Orthopedics;  Laterality: Left;  . Endometrial ablation    . Colonoscopy  2009    Dr. Wilford Corner: 4 hyperplastic polyps removed. Small internal hemorrhoids. Recommended 5 year follow-up colonoscopy.  . Colonoscopy N/A 02/24/2015    Procedure: COLONOSCOPY;  Surgeon: Danie Binder, MD;  Location: [facility];  Service: Endoscopy;  Laterality: N/A;  1030  . Hysteroscopy w/d&c N/A 04/02/2015    Procedure: UTERINE CURETTAGE, HYSTEROSCOPY;  Surgeon: Florian Buff, MD;  Location: [facility];  Service: Gynecology;  Laterality: N/A;   Medications: Reviewed & Updated - see associated section                       Current outpatient prescriptions:  .  ACCU-CHEK AVIVA PLUS test strip, , Disp: , Rfl:  .  ACCU-CHEK SOFTCLIX LANCETS lancets, , Disp: , Rfl:  .  acetaminophen (TYLENOL) 325 MG tablet,  Take 650 mg by mouth every 6 (six) hours as needed for mild pain. , Disp: , Rfl:  .  Alcohol Swabs (B-D SINGLE USE SWABS REGULAR) PADS, , Disp: , Rfl:  .  apixaban (ELIQUIS) 5 MG TABS tablet, Take 1 tablet (5 mg total) by mouth 2 (two) times daily., Disp: 60 tablet, Rfl: 3 .  atorvastatin (LIPITOR) 80 MG tablet, Take 1 tablet (80 mg total) by mouth daily., Disp: 90 tablet, Rfl: 3 .  bisacodyl (DULCOLAX) 5 MG EC tablet, Take 5 mg by mouth daily as needed for moderate constipation., Disp: , Rfl:  .  Blood Glucose Monitoring Suppl (ACCU-CHEK AVIVA PLUS) W/DEVICE KIT, , Disp: , Rfl:  .  cholecalciferol (VITAMIN D) 1000 UNITS tablet, Take 1 tablet (1,000 Units total) by mouth every morning., Disp: 30 tablet, Rfl: 1 .  diltiazem (CARDIZEM CD) 180 MG 24 hr capsule, Take 1 capsule (180 mg total) by mouth daily.,  Disp: 90 capsule, Rfl: 3 .  furosemide (LASIX) 20 MG tablet, Take 40 mg by mouth 2 (two) times daily. , Disp: , Rfl:  .  gabapentin (NEURONTIN) 100 MG capsule, Take 200 mg by mouth at bedtime. , Disp: , Rfl:  .  guaiFENesin (ROBITUSSIN) 100 MG/5ML liquid, Take 100 mg by mouth every 4 (four) hours., Disp: , Rfl:  .  ipratropium-albuterol (DUONEB) 0.5-2.5 (3) MG/3ML SOLN, Inhale 3 mLs into the lungs every 6 (six) hours as needed. , Disp: , Rfl:  .  KLOR-CON M20 20 MEQ tablet, Take 20 mEq by mouth daily. , Disp: , Rfl:  .  Linaclotide (LINZESS) 145 MCG CAPS capsule, 1 PO 30 mins prior to your first meal (Patient taking differently: Take 145 mcg by mouth daily. ), Disp: 30 capsule, Rfl: 11 .  megestrol (MEGACE) 40 MG tablet, Take 40 mg by mouth 3 (three) times daily., Disp: , Rfl:  .  oxybutynin (DITROPAN-XL) 5 MG 24 hr tablet, Take 5 mg by mouth at bedtime. , Disp: , Rfl:  .  TRADJENTA 5 MG TABS tablet, Take 5 mg by mouth daily. , Disp: , Rfl: 0 .  traMADol (ULTRAM) 50 MG tablet, Take by mouth every 6 (six) hours as needed. Reported on 11/19/2015, Disp: , Rfl:  .  VENTOLIN HFA 108 (90 BASE) MCG/ACT inhaler, Inhale 2 puffs into the lungs every 4 (four) hours as needed. , Disp: , Rfl:  .  Ampicillin-Sulbactam (UNASYN) 3 (2-1) g SOLR injection, Inject 3 g as directed every 6 (six) hours. As needed for infection., Disp: , Rfl:  .  lidocaine (LIDODERM) 5 %, Place 1 patch onto the skin daily. Remove & Discard patch within 12 hours or as directed by MD, Disp: , Rfl:  .  linezolid (ZYVOX) 600 MG tablet, Take 600 mg by mouth 2 (two) times daily. For 7 days., Disp: , Rfl:  .  TOVIAZ 4 MG TB24 tablet, TK 1 T PO D, Disp: , Rfl: 3 .  ZOSYN 3-0.375 GM/50ML IVPB, , Disp: , Rfl:    Social History: Reviewed -  reports that she quit smoking about 7 months ago. Her smoking use included Cigarettes. She has a 7.5 pack-year smoking history. She has never used smokeless tobacco.  Objective Findings:  Vitals: Blood  pressure 140/82, height '5\' 1"'  (1.549 m), last menstrual period 03/31/2013.  Physical Examination: General appearance - alert, well appearing, and in no distress, oriented to person, place, and time and acyanotic, in no respiratory distress Mental status - alert, oriented to person, place,  and time, confused at times   Assessment & Plan:   A:  1. Complex and simple endometial hyperplasiak withoult atypia on full d&c 2016 2. IUD in place  P:  1. 1. followup  6 mos ros, AND pelvic u/s

## 2016-01-20 DIAGNOSIS — E08319 Diabetes mellitus due to underlying condition with unspecified diabetic retinopathy without macular edema: Secondary | ICD-10-CM | POA: Diagnosis not present

## 2016-01-20 DIAGNOSIS — D509 Iron deficiency anemia, unspecified: Secondary | ICD-10-CM | POA: Diagnosis not present

## 2016-01-21 ENCOUNTER — Encounter: Payer: Self-pay | Admitting: Orthopaedic Surgery

## 2016-01-21 ENCOUNTER — Ambulatory Visit (INDEPENDENT_AMBULATORY_CARE_PROVIDER_SITE_OTHER): Payer: Commercial Managed Care - HMO | Admitting: Orthopaedic Surgery

## 2016-01-21 VITALS — BP 119/68 | HR 75 | Temp 97.2°F | Resp 18 | Ht 61.0 in | Wt 270.0 lb

## 2016-01-21 DIAGNOSIS — M25561 Pain in right knee: Secondary | ICD-10-CM | POA: Insufficient documentation

## 2016-01-21 NOTE — Progress Notes (Signed)
Patient [name], female DOB:June 04, 1941, 75 y.o. TMH:[id number]  Chief Complaint  Patient presents with  . Follow-up    follow up bilateral knees, "feels terrible'    HPI  [name] is a 75 y.o. female who has had CVA with left sided weakness and right knee pain and swelling  She is a resident at [facility].  She is in a wheelchair.  She had more pain in the right knee over the last several weeks. She has popping of the knee but no giving way, no redness, no locking.  She is doing some PT.   Knee Pain  There was no injury mechanism. The pain is present in the right knee. The quality of the pain is described as aching. The pain is at a severity of 3/10. The pain is mild. The pain has been fluctuating since onset. Associated symptoms include an inability to bear weight and a loss of motion. Pertinent negatives include no numbness or tingling. She has tried acetaminophen, ice, heat, non-weight bearing, NSAIDs and rest for the symptoms. The treatment provided mild relief.    Body mass index is 51.04 kg/(m^2).  Review of Systems  Patient has Diabetes Mellitus. Patient has hypertension. Patient has COPD or shortness of breath. Patient has BMI > 35. Patient does not have current smoking history.  Review of Systems  HENT:       Hard of hearing  Respiratory: Positive for cough and wheezing.   Cardiovascular:       Hypertension, Atrial Fib  Endocrine:       Diabetes mellitus  Musculoskeletal: Positive for back pain, joint swelling (right knee swelling) and arthralgias (multiple joints).  Neurological: Negative for tingling and numbness.       Old CVA with left sided weakness    Past Medical History  Diagnosis Date  . Diabetes mellitus   . Asthma   . Chronic knee pain   . Back pain, chronic   . Hypertension   . Vertigo   . Hyperlipidemia   . TIA (transient ischemic attack)   . Anemia   . HOH (hard of hearing)   . Arthritis   . Obesity   . PMB (postmenopausal  bleeding) 11/26/2014  . Urge incontinence 11/26/2014  . Thickened endometrium 01/15/2015  . Dysrhythmia   . Stroke [facility])     Mini Stroke    Past Surgical History  Procedure Laterality Date  . Appendectomy    . Ovary surgery    . Cataract extraction w/phaco Left 01/16/2013    Procedure: CATARACT EXTRACTION PHACO AND INTRAOCULAR LENS PLACEMENT (IOC);  Surgeon: Elta Guadeloupe T. Gershon Crane, MD;  Location: [facility];  Service: Ophthalmology;  Laterality: Left;  CDE:14.37  . Endometrial biopsy  04/03/2013       . Hysteroscopy w/d&c N/A 04/24/2013    Procedure: SUCTION DILATATION AND CURETTAGE /HYSTEROSCOPY;  Surgeon: Jonnie Kind, MD;  Location: [facility];  Service: Gynecology;  Laterality: N/A;  . Knee arthroscopy with medial menisectomy Left 07/26/2013    Procedure: KNEE ARTHROSCOPY WITH PARTIAL MEDIAL MENISECTOMY;  Surgeon: Sanjuana Kava, MD;  Location: [facility];  Service: Orthopedics;  Laterality: Left;  . Endometrial ablation    . Colonoscopy  2009    Dr. Wilford Corner: 4 hyperplastic polyps removed. Small internal hemorrhoids. Recommended 5 year follow-up colonoscopy.  . Colonoscopy N/A 02/24/2015    Procedure: COLONOSCOPY;  Surgeon: Danie Binder, MD;  Location[facility];  Service: Endoscopy;  Laterality: N/A;  1030  . Hysteroscopy w/d&c  N/A 04/02/2015    Procedure: UTERINE CURETTAGE, HYSTEROSCOPY;  Surgeon: Florian Buff, MD;  Location: [facility];  Service: Gynecology;  Laterality: N/A;    Family History  Problem Relation Age of Onset  . Arrhythmia Father     Atrial fib/Pacemaker  . Heart failure Mother   . Diabetes Mother   . Other Mother     fell and broke hip  . Diabetes Sister   . Lupus Daughter   . Mental illness Son   . ADD / ADHD Son   . Other Son     soft bones  . Diabetes Maternal Grandmother   . Stroke Paternal Grandmother   . Arthritis Paternal Grandfather   . Other Sister     MVA  . Hypertension Brother   . Diabetes Sister   . Hypertension Sister   . Colon cancer Neg Hx      Social History Social History  Substance Use Topics  . Smoking status: Former Smoker -- 0.25 packs/day for 30 years    Types: Cigarettes    Quit date: 05/20/2015  . Smokeless tobacco: Never Used  . Alcohol Use: No    Allergies  Allergen Reactions  . Imitrex [Sumatriptan] Other (See Comments)    Unknown Reaction   . Mango Flavor     Nausea and vomiting  . Oysters [Shellfish Allergy]     Nausea and vomiting    Current Outpatient Prescriptions  Medication Sig Dispense Refill  . ACCU-CHEK AVIVA PLUS test strip     . ACCU-CHEK SOFTCLIX LANCETS lancets     . acetaminophen (TYLENOL) 325 MG tablet Take 650 mg by mouth every 6 (six) hours as needed for mild pain.     . Alcohol Swabs (B-D SINGLE USE SWABS REGULAR) PADS     . Ampicillin-Sulbactam (UNASYN) 3 (2-1) g SOLR injection Inject 3 g as directed every 6 (six) hours. As needed for infection.    Marland Kitchen apixaban (ELIQUIS) 5 MG TABS tablet Take 1 tablet (5 mg total) by mouth 2 (two) times daily. 60 tablet 3  . atorvastatin (LIPITOR) 80 MG tablet Take 1 tablet (80 mg total) by mouth daily. 90 tablet 3  . bisacodyl (DULCOLAX) 5 MG EC tablet Take 5 mg by mouth daily as needed for moderate constipation.    . Blood Glucose Monitoring Suppl (ACCU-CHEK AVIVA PLUS) W/DEVICE KIT     . cholecalciferol (VITAMIN D) 1000 UNITS tablet Take 1 tablet (1,000 Units total) by mouth every morning. 30 tablet 1  . diltiazem (CARDIZEM CD) 180 MG 24 hr capsule Take 1 capsule (180 mg total) by mouth daily. 90 capsule 3  . furosemide (LASIX) 20 MG tablet Take 40 mg by mouth 2 (two) times daily.     Marland Kitchen gabapentin (NEURONTIN) 100 MG capsule Take 200 mg by mouth at bedtime.     Marland Kitchen guaiFENesin (ROBITUSSIN) 100 MG/5ML liquid Take 100 mg by mouth every 4 (four) hours.    Marland Kitchen ipratropium-albuterol (DUONEB) 0.5-2.5 (3) MG/3ML SOLN Inhale 3 mLs into the lungs every 6 (six) hours as needed.     Marland Kitchen KLOR-CON M20 20 MEQ tablet Take 20 mEq by mouth daily.     Marland Kitchen lidocaine  (LIDODERM) 5 % Place 1 patch onto the skin daily. Remove & Discard patch within 12 hours or as directed by MD    . Linaclotide (LINZESS) 145 MCG CAPS capsule 1 PO 30 mins prior to your first meal (Patient taking differently: Take 145 mcg by mouth daily. ) 30  capsule 11  . linezolid (ZYVOX) 600 MG tablet Take 600 mg by mouth 2 (two) times daily. For 7 days.    . megestrol (MEGACE) 40 MG tablet Take 40 mg by mouth 3 (three) times daily.    Marland Kitchen oxybutynin (DITROPAN-XL) 5 MG 24 hr tablet Take 5 mg by mouth at bedtime.     . TOVIAZ 4 MG TB24 tablet TK 1 T PO D  3  . TRADJENTA 5 MG TABS tablet Take 5 mg by mouth daily.   0  . traMADol (ULTRAM) 50 MG tablet Take by mouth every 6 (six) hours as needed. Reported on 11/19/2015    . VENTOLIN HFA 108 (90 BASE) MCG/ACT inhaler Inhale 2 puffs into the lungs every 4 (four) hours as needed.     Marland Kitchen ZOSYN 3-0.375 GM/50ML IVPB      No current facility-administered medications for this visit.     Physical Exam  Blood pressure 119/68, pulse 75, temperature 97.2 F (36.2 C), resp. rate 18, height _0  (1.549 m), weight 270 lb (122.471 kg), last menstrual period 03/31/2013.  Constitutional: overall normal hygiene, normal nutrition, well developed, normal grooming, obese body habitus. Assistive device:wheelchair  Musculoskeletal: gait and station Limp not able to walk well, in wheelchair, muscle tone and strength are normal, no tremors or atrophy is present.  .  Neurological: coordination is not normal secondary to left sided weakness from old CVA .  Deep tendon reflex/nerve stretch intact.  Sensation normal.  Cranial nerves II-XII intact.   Skin:normal  overall no scars, lesions, ulcers or rash es. No psoriasis.  Psychiatric: Alert and oriented x 3.  Recent memory intact, remote memory unclear.  Normal mood and affect. Well groomed.  Good eye contact.  Cardiovascular: overall no swelling, no varicosities, no edema bilaterally, normal temperatures of the legs  and arms, no clubbing, cyanosis and good capillary refill.  Lymphatic: palpation is normal.  The right lower extremity is examined:  Inspection:  Thigh:  Non-tender and no defects  Knee has swelling 1+ effusion.                        Joint tenderness is present                        Patient is tender over the medial joint line  Lower Leg:  Has normal appearance and no tenderness or defects  Ankle:  Non-tender and no defects  Foot:  Non-tender and no defects Range of Motion:  Knee:  Range of motion is:                        Crepitus is  present  Ankle:  Range of motion is normal. Strength and Tone:  The right lower extremity has normal strength and tone. Stability:  Knee:  The knee is stable.  Ankle:  The ankle is stable.  The patient request injection, verbal consent was obtained.  The right knee was prepped appropriately after time out was performed.   Sterile technique was observed and injection of 1 cc of Depo-Medrol 40 mg with several cc's of plain xylocaine. Anesthesia was provided by ethyl chloride and a 20-gauge needle was used to inject the knee area. The injection was tolerated well.  A band aid dressing was applied.  The patient was advised to apply ice later today and tomorrow to the injection sight as needed.   Additional services  performed: forms completed for skilled nursing home, [facility].  I reviewed her diabetes status and her left sided weakness from her prior CVA.  I have encouraged her to continue PT at the nursing home and have written orders for this.  The right knee was injected to help her walk better as she is weak on the left side.  PLAN Call if any problems.  Precautions discussed.  Continue current medications.   Return to clinic 6 weeks.

## 2016-01-21 NOTE — Patient Instructions (Signed)

## 2016-01-23 DIAGNOSIS — I639 Cerebral infarction, unspecified: Secondary | ICD-10-CM | POA: Diagnosis not present

## 2016-01-23 DIAGNOSIS — R2681 Unsteadiness on feet: Secondary | ICD-10-CM | POA: Diagnosis not present

## 2016-01-23 DIAGNOSIS — I69954 Hemiplegia and hemiparesis following unspecified cerebrovascular disease affecting left non-dominant side: Secondary | ICD-10-CM | POA: Diagnosis not present

## 2016-01-23 DIAGNOSIS — R262 Difficulty in walking, not elsewhere classified: Secondary | ICD-10-CM | POA: Diagnosis not present

## 2016-01-23 DIAGNOSIS — M6281 Muscle weakness (generalized): Secondary | ICD-10-CM | POA: Diagnosis not present

## 2016-01-26 DIAGNOSIS — R2681 Unsteadiness on feet: Secondary | ICD-10-CM | POA: Diagnosis not present

## 2016-01-26 DIAGNOSIS — N189 Chronic kidney disease, unspecified: Secondary | ICD-10-CM | POA: Diagnosis not present

## 2016-01-26 DIAGNOSIS — M6281 Muscle weakness (generalized): Secondary | ICD-10-CM | POA: Diagnosis not present

## 2016-01-26 DIAGNOSIS — R262 Difficulty in walking, not elsewhere classified: Secondary | ICD-10-CM | POA: Diagnosis not present

## 2016-01-26 DIAGNOSIS — I69954 Hemiplegia and hemiparesis following unspecified cerebrovascular disease affecting left non-dominant side: Secondary | ICD-10-CM | POA: Diagnosis not present

## 2016-01-26 DIAGNOSIS — M25561 Pain in right knee: Secondary | ICD-10-CM | POA: Diagnosis not present

## 2016-01-26 DIAGNOSIS — I639 Cerebral infarction, unspecified: Secondary | ICD-10-CM | POA: Diagnosis not present

## 2016-01-26 DIAGNOSIS — F6089 Other specific personality disorders: Secondary | ICD-10-CM | POA: Diagnosis not present

## 2016-01-26 DIAGNOSIS — I6359 Cerebral infarction due to unspecified occlusion or stenosis of other cerebral artery: Secondary | ICD-10-CM | POA: Diagnosis not present

## 2016-01-27 DIAGNOSIS — D509 Iron deficiency anemia, unspecified: Secondary | ICD-10-CM | POA: Diagnosis not present

## 2016-01-27 DIAGNOSIS — I69954 Hemiplegia and hemiparesis following unspecified cerebrovascular disease affecting left non-dominant side: Secondary | ICD-10-CM | POA: Diagnosis not present

## 2016-01-27 DIAGNOSIS — I639 Cerebral infarction, unspecified: Secondary | ICD-10-CM | POA: Diagnosis not present

## 2016-01-27 DIAGNOSIS — M6281 Muscle weakness (generalized): Secondary | ICD-10-CM | POA: Diagnosis not present

## 2016-01-27 DIAGNOSIS — R4182 Altered mental status, unspecified: Secondary | ICD-10-CM | POA: Diagnosis not present

## 2016-01-27 DIAGNOSIS — R2681 Unsteadiness on feet: Secondary | ICD-10-CM | POA: Diagnosis not present

## 2016-01-27 DIAGNOSIS — R262 Difficulty in walking, not elsewhere classified: Secondary | ICD-10-CM | POA: Diagnosis not present

## 2016-01-28 DIAGNOSIS — R262 Difficulty in walking, not elsewhere classified: Secondary | ICD-10-CM | POA: Diagnosis not present

## 2016-01-28 DIAGNOSIS — I69954 Hemiplegia and hemiparesis following unspecified cerebrovascular disease affecting left non-dominant side: Secondary | ICD-10-CM | POA: Diagnosis not present

## 2016-01-28 DIAGNOSIS — M6281 Muscle weakness (generalized): Secondary | ICD-10-CM | POA: Diagnosis not present

## 2016-01-28 DIAGNOSIS — I639 Cerebral infarction, unspecified: Secondary | ICD-10-CM | POA: Diagnosis not present

## 2016-01-28 DIAGNOSIS — R2681 Unsteadiness on feet: Secondary | ICD-10-CM | POA: Diagnosis not present

## 2016-01-29 DIAGNOSIS — R2681 Unsteadiness on feet: Secondary | ICD-10-CM | POA: Diagnosis not present

## 2016-01-29 DIAGNOSIS — I639 Cerebral infarction, unspecified: Secondary | ICD-10-CM | POA: Diagnosis not present

## 2016-01-29 DIAGNOSIS — M6281 Muscle weakness (generalized): Secondary | ICD-10-CM | POA: Diagnosis not present

## 2016-01-29 DIAGNOSIS — I69954 Hemiplegia and hemiparesis following unspecified cerebrovascular disease affecting left non-dominant side: Secondary | ICD-10-CM | POA: Diagnosis not present

## 2016-01-29 DIAGNOSIS — R262 Difficulty in walking, not elsewhere classified: Secondary | ICD-10-CM | POA: Diagnosis not present

## 2016-01-30 DIAGNOSIS — I69954 Hemiplegia and hemiparesis following unspecified cerebrovascular disease affecting left non-dominant side: Secondary | ICD-10-CM | POA: Diagnosis not present

## 2016-01-30 DIAGNOSIS — M6281 Muscle weakness (generalized): Secondary | ICD-10-CM | POA: Diagnosis not present

## 2016-01-30 DIAGNOSIS — R262 Difficulty in walking, not elsewhere classified: Secondary | ICD-10-CM | POA: Diagnosis not present

## 2016-01-30 DIAGNOSIS — R2681 Unsteadiness on feet: Secondary | ICD-10-CM | POA: Diagnosis not present

## 2016-01-30 DIAGNOSIS — I639 Cerebral infarction, unspecified: Secondary | ICD-10-CM | POA: Diagnosis not present

## 2016-02-02 DIAGNOSIS — R4182 Altered mental status, unspecified: Secondary | ICD-10-CM | POA: Diagnosis not present

## 2016-02-02 DIAGNOSIS — I639 Cerebral infarction, unspecified: Secondary | ICD-10-CM | POA: Diagnosis not present

## 2016-02-02 DIAGNOSIS — I69954 Hemiplegia and hemiparesis following unspecified cerebrovascular disease affecting left non-dominant side: Secondary | ICD-10-CM | POA: Diagnosis not present

## 2016-02-02 DIAGNOSIS — I480 Paroxysmal atrial fibrillation: Secondary | ICD-10-CM | POA: Diagnosis not present

## 2016-02-02 DIAGNOSIS — M6281 Muscle weakness (generalized): Secondary | ICD-10-CM | POA: Diagnosis not present

## 2016-02-02 DIAGNOSIS — F6089 Other specific personality disorders: Secondary | ICD-10-CM | POA: Diagnosis not present

## 2016-02-02 DIAGNOSIS — I6359 Cerebral infarction due to unspecified occlusion or stenosis of other cerebral artery: Secondary | ICD-10-CM | POA: Diagnosis not present

## 2016-02-02 DIAGNOSIS — R0989 Other specified symptoms and signs involving the circulatory and respiratory systems: Secondary | ICD-10-CM | POA: Diagnosis not present

## 2016-02-02 DIAGNOSIS — R2681 Unsteadiness on feet: Secondary | ICD-10-CM | POA: Diagnosis not present

## 2016-02-02 DIAGNOSIS — R262 Difficulty in walking, not elsewhere classified: Secondary | ICD-10-CM | POA: Diagnosis not present

## 2016-02-02 DIAGNOSIS — Z712 Person consulting for explanation of examination or test findings: Secondary | ICD-10-CM | POA: Diagnosis not present

## 2016-02-03 DIAGNOSIS — R2681 Unsteadiness on feet: Secondary | ICD-10-CM | POA: Diagnosis not present

## 2016-02-03 DIAGNOSIS — M6281 Muscle weakness (generalized): Secondary | ICD-10-CM | POA: Diagnosis not present

## 2016-02-03 DIAGNOSIS — I639 Cerebral infarction, unspecified: Secondary | ICD-10-CM | POA: Diagnosis not present

## 2016-02-03 DIAGNOSIS — R262 Difficulty in walking, not elsewhere classified: Secondary | ICD-10-CM | POA: Diagnosis not present

## 2016-02-03 DIAGNOSIS — I69954 Hemiplegia and hemiparesis following unspecified cerebrovascular disease affecting left non-dominant side: Secondary | ICD-10-CM | POA: Diagnosis not present

## 2016-02-04 DIAGNOSIS — I639 Cerebral infarction, unspecified: Secondary | ICD-10-CM | POA: Diagnosis not present

## 2016-02-04 DIAGNOSIS — M6281 Muscle weakness (generalized): Secondary | ICD-10-CM | POA: Diagnosis not present

## 2016-02-05 DIAGNOSIS — M6281 Muscle weakness (generalized): Secondary | ICD-10-CM | POA: Diagnosis not present

## 2016-02-05 DIAGNOSIS — I639 Cerebral infarction, unspecified: Secondary | ICD-10-CM | POA: Diagnosis not present

## 2016-02-06 DIAGNOSIS — I639 Cerebral infarction, unspecified: Secondary | ICD-10-CM | POA: Diagnosis not present

## 2016-02-06 DIAGNOSIS — M6281 Muscle weakness (generalized): Secondary | ICD-10-CM | POA: Diagnosis not present

## 2016-02-09 DIAGNOSIS — I639 Cerebral infarction, unspecified: Secondary | ICD-10-CM | POA: Diagnosis not present

## 2016-02-09 DIAGNOSIS — M6281 Muscle weakness (generalized): Secondary | ICD-10-CM | POA: Diagnosis not present

## 2016-02-10 DIAGNOSIS — D509 Iron deficiency anemia, unspecified: Secondary | ICD-10-CM | POA: Diagnosis not present

## 2016-02-10 DIAGNOSIS — R4182 Altered mental status, unspecified: Secondary | ICD-10-CM | POA: Diagnosis not present

## 2016-02-10 DIAGNOSIS — M6281 Muscle weakness (generalized): Secondary | ICD-10-CM | POA: Diagnosis not present

## 2016-02-10 DIAGNOSIS — I639 Cerebral infarction, unspecified: Secondary | ICD-10-CM | POA: Diagnosis not present

## 2016-02-11 DIAGNOSIS — I639 Cerebral infarction, unspecified: Secondary | ICD-10-CM | POA: Diagnosis not present

## 2016-02-11 DIAGNOSIS — M6281 Muscle weakness (generalized): Secondary | ICD-10-CM | POA: Diagnosis not present

## 2016-02-12 DIAGNOSIS — M6281 Muscle weakness (generalized): Secondary | ICD-10-CM | POA: Diagnosis not present

## 2016-02-12 DIAGNOSIS — I639 Cerebral infarction, unspecified: Secondary | ICD-10-CM | POA: Diagnosis not present

## 2016-02-13 DIAGNOSIS — M6281 Muscle weakness (generalized): Secondary | ICD-10-CM | POA: Diagnosis not present

## 2016-02-13 DIAGNOSIS — I639 Cerebral infarction, unspecified: Secondary | ICD-10-CM | POA: Diagnosis not present

## 2016-02-16 ENCOUNTER — Encounter: Payer: Commercial Managed Care - HMO | Attending: Physical Medicine & Rehabilitation

## 2016-02-16 ENCOUNTER — Ambulatory Visit (HOSPITAL_BASED_OUTPATIENT_CLINIC_OR_DEPARTMENT_OTHER): Payer: Commercial Managed Care - HMO | Admitting: Physical Medicine & Rehabilitation

## 2016-02-16 ENCOUNTER — Encounter: Payer: Self-pay | Admitting: Physical Medicine & Rehabilitation

## 2016-02-16 VITALS — BP 141/60 | HR 71 | Resp 14

## 2016-02-16 DIAGNOSIS — I69854 Hemiplegia and hemiparesis following other cerebrovascular disease affecting left non-dominant side: Secondary | ICD-10-CM | POA: Diagnosis not present

## 2016-02-16 DIAGNOSIS — M6281 Muscle weakness (generalized): Secondary | ICD-10-CM | POA: Diagnosis not present

## 2016-02-16 DIAGNOSIS — M1990 Unspecified osteoarthritis, unspecified site: Secondary | ICD-10-CM | POA: Insufficient documentation

## 2016-02-16 DIAGNOSIS — G8110 Spastic hemiplegia affecting unspecified side: Secondary | ICD-10-CM | POA: Diagnosis not present

## 2016-02-16 DIAGNOSIS — M79642 Pain in left hand: Secondary | ICD-10-CM | POA: Insufficient documentation

## 2016-02-16 DIAGNOSIS — E119 Type 2 diabetes mellitus without complications: Secondary | ICD-10-CM | POA: Diagnosis not present

## 2016-02-16 DIAGNOSIS — E785 Hyperlipidemia, unspecified: Secondary | ICD-10-CM | POA: Insufficient documentation

## 2016-02-16 DIAGNOSIS — I639 Cerebral infarction, unspecified: Secondary | ICD-10-CM | POA: Diagnosis not present

## 2016-02-16 DIAGNOSIS — I10 Essential (primary) hypertension: Secondary | ICD-10-CM | POA: Insufficient documentation

## 2016-02-16 NOTE — Progress Notes (Signed)
Subjective:    Patient ID: [name], female    DOB: 06/20/1941, 75 y.o.   MRN: [id number]  HPI  1/30/2017LUE Botox    Botox Injection for spasticity using needle EMG guidance  Dilution: 50 Units/ml Indication: Severe spasticity which interferes with ADL,mobility and/or  hygiene and is unresponsive to medication management and other conservative care Informed consent was obtained after describing risks and benefits of the procedure with the patient. This includes bleeding, bruising, infection, excessive weakness, or medication side effects. A REMS form is on file and signed. Needle: 25g 2" needle electrode Number of units per muscle  Biceps100  FDS25 FDP25 Pronator teres 50  Patient still working with PT and OT. She is talking about going home states her insurance is not paying anymore for skilled nursing facility  Pain Inventory Average Pain no selection Pain Right Now no selection My pain is no selection  In the last 24 hours, has pain interfered with the following? General activity no selection Relation with others no selection Enjoyment of life no selection What TIME of day is your pain at its worst? no selection Sleep (in general) no selection  Pain is worse with: no selection Pain improves with: no selection Relief from Meds: no selection  Mobility use a wheelchair  Function disabled: date disabled .  Neuro/Psych No problems in this area  Prior Studies no selection  Physicians involved in your care no selection   Family History  Problem Relation Age of Onset  . Arrhythmia Father     Atrial fib/Pacemaker  . Heart failure Mother   . Diabetes Mother   . Other Mother     fell and broke hip  . Diabetes Sister   . Lupus Daughter   . Mental illness Son   . ADD / ADHD Son   . Other Son     soft bones  . Diabetes Maternal Grandmother   . Stroke Paternal Grandmother   . Arthritis Paternal Grandfather   . Other Sister     MVA  .  Hypertension Brother   . Diabetes Sister   . Hypertension Sister   . Colon cancer Neg Hx    Social History   Social History  . Marital Status: Widowed    Spouse Name: N/A  . Number of Children: 2  . Years of Education: N/A   Occupational History  . retired    Social History Main Topics  . Smoking status: Former Smoker -- 0.25 packs/day for 30 years    Types: Cigarettes    Quit date: 05/20/2015  . Smokeless tobacco: Never Used  . Alcohol Use: No  . Drug Use: No  . Sexual Activity: Not Currently    Birth Control/ Protection: Post-menopausal     Comment: ablation   Other Topics Concern  . None   Social History Narrative   Past Surgical History  Procedure Laterality Date  . Appendectomy    . Ovary surgery    . Cataract extraction w/phaco Left 01/16/2013    Procedure: CATARACT EXTRACTION PHACO AND INTRAOCULAR LENS PLACEMENT (IOC);  Surgeon: Elta Guadeloupe T. Gershon Crane, MD;  Location: [facility];  Service: Ophthalmology;  Laterality: Left;  CDE:14.37  . Endometrial biopsy  04/03/2013       . Hysteroscopy w/d&c N/A 04/24/2013    Procedure: SUCTION DILATATION AND CURETTAGE /HYSTEROSCOPY;  Surgeon: Jonnie Kind, MD;  Location: [facility];  Service: Gynecology;  Laterality: N/A;  . Knee arthroscopy with medial menisectomy Left 07/26/2013  Procedure: KNEE ARTHROSCOPY WITH PARTIAL MEDIAL MENISECTOMY;  Surgeon: Sanjuana Kava, MD;  Location: [facility];  Service: Orthopedics;  Laterality: Left;  . Endometrial ablation    . Colonoscopy  2009    Dr. Wilford Corner: 4 hyperplastic polyps removed. Small internal hemorrhoids. Recommended 5 year follow-up colonoscopy.  . Colonoscopy N/A 02/24/2015    Procedure: COLONOSCOPY;  Surgeon: Danie Binder, MD;  Location[facility];  Service: Endoscopy;  Laterality: N/A;  1030  . Hysteroscopy w/d&c N/A 04/02/2015    Procedure: UTERINE CURETTAGE, HYSTEROSCOPY;  Surgeon: Florian Buff, MD;  Location: [facility];  Service: Gynecology;  Laterality: N/A;   Past Medical  History  Diagnosis Date  . Diabetes mellitus   . Asthma   . Chronic knee pain   . Back pain, chronic   . Hypertension   . Vertigo   . Hyperlipidemia   . TIA (transient ischemic attack)   . Anemia   . HOH (hard of hearing)   . Arthritis   . Obesity   . PMB (postmenopausal bleeding) 11/26/2014  . Urge incontinence 11/26/2014  . Thickened endometrium 01/15/2015  . Dysrhythmia   . Stroke ([place])     Mini Stroke   BP 141/60 mmHg  Pulse 71  Resp 14  SpO2 99%  LMP 03/31/2013  Opioid Risk Score:   Fall Risk Score:  `1  Depression screen PHQ 2/9  Depression screen [facility] 2/9 01/05/2016 09/02/2015 06/27/2015  Decreased Interest 0 0 1  Down, Depressed, Hopeless 0 0 0  PHQ - 2 Score 0 0 1  Altered sleeping - - 3  Tired, decreased energy - - 2  Change in appetite - - 0  Feeling bad or failure about yourself  - - 0  Trouble concentrating - - 3  Moving slowly or fidgety/restless - - 0  Suicidal thoughts - - 0  PHQ-9 Score - - 9     Review of Systems  All other systems reviewed and are negative.      Objective:   Physical Exam  Constitutional: She appears well-developed and well-nourished.  HENT:  Head: Normocephalic and atraumatic.  Eyes: Conjunctivae and EOM are normal. Pupils are equal, round, and reactive to light.  Neck: Normal range of motion.  Neurological: She is alert. A sensory deficit is present.  Motor strength is 3 minus at the left deltoid, bicep, tricep, grip, finger flexors and extensors.  Tone is Ashworth grade 0 at the elbow flexors wrist flexors and finger flexors  Sensation is mildly impaired in the left upper extremity  Psychiatric: Her affect is inappropriate. Her speech is delayed. Cognition and memory are impaired.  Talking about hitting people with her weak arm  Did not remember having injections last visit  Poor insight into deficits  Nursing note and vitals reviewed.         Assessment & Plan:  1.  Left spastic hemiparesis due to CVA,  Right MCA infarct onset 05/10/2015 Has good improvement with excessive flexion of the left arm wrist and fingers after Botox injection. The doses and muscles injected are appropriate. Would not do any changes compared to last treatment. We'll need repeat treatment in 6 weeks

## 2016-02-16 NOTE — Patient Instructions (Signed)
Good effect from the Botox injection on 01/05/2016 would recommend same doses as a muscle. If you leave the skilled nursing facility prior to that time please get someone to drive you to this appointment

## 2016-02-17 DIAGNOSIS — R4182 Altered mental status, unspecified: Secondary | ICD-10-CM | POA: Diagnosis not present

## 2016-02-17 DIAGNOSIS — E08319 Diabetes mellitus due to underlying condition with unspecified diabetic retinopathy without macular edema: Secondary | ICD-10-CM | POA: Diagnosis not present

## 2016-02-17 DIAGNOSIS — I639 Cerebral infarction, unspecified: Secondary | ICD-10-CM | POA: Diagnosis not present

## 2016-02-17 DIAGNOSIS — M6281 Muscle weakness (generalized): Secondary | ICD-10-CM | POA: Diagnosis not present

## 2016-02-18 DIAGNOSIS — I639 Cerebral infarction, unspecified: Secondary | ICD-10-CM | POA: Diagnosis not present

## 2016-02-18 DIAGNOSIS — M6281 Muscle weakness (generalized): Secondary | ICD-10-CM | POA: Diagnosis not present

## 2016-02-19 ENCOUNTER — Encounter: Payer: Self-pay | Admitting: Physician Assistant

## 2016-02-19 ENCOUNTER — Ambulatory Visit (INDEPENDENT_AMBULATORY_CARE_PROVIDER_SITE_OTHER): Payer: Commercial Managed Care - HMO | Admitting: Physician Assistant

## 2016-02-19 VITALS — BP 126/62 | HR 85 | Ht 60.0 in | Wt 265.0 lb

## 2016-02-19 DIAGNOSIS — I4891 Unspecified atrial fibrillation: Secondary | ICD-10-CM | POA: Diagnosis not present

## 2016-02-19 DIAGNOSIS — I639 Cerebral infarction, unspecified: Secondary | ICD-10-CM | POA: Diagnosis not present

## 2016-02-19 DIAGNOSIS — M6281 Muscle weakness (generalized): Secondary | ICD-10-CM | POA: Diagnosis not present

## 2016-02-19 DIAGNOSIS — I10 Essential (primary) hypertension: Secondary | ICD-10-CM

## 2016-02-19 NOTE — Patient Instructions (Signed)
Your physician recommends that you schedule a follow-up appointment in: 3 months with Dr. Harl Bowie   Your physician recommends that you continue on your current medications as directed. Please refer to the Current Medication list given to you today.  If you need a refill on your cardiac medications before your next appointment, please call your pharmacy.  Your physician recommends that you weigh, daily, at the same time every day, and in the same amount of clothing. Please record your daily weights on the handout provided and bring it to your next appointment.   Thank you for choosing [facility]!

## 2016-02-19 NOTE — Progress Notes (Signed)
Cardiology Office Note   Date:  02/19/2016   ID:  [name], [name] 1941-11-06, MRN [id number]  PCP:  Rosita Fire, MD  Cardiologist:  Dr Higinio Roger, Suanne Marker, PA-C   History of Present Illness: [name] is a 75 y.o. female with a history of DM, HTN, HLD, TIA, CVA, HOH, PAF on Eliquis and Cardizem CD 180 mg.   Seen in ER for chest pain 01/26, was in atrial fib at that time. Asked to f/u with cardiology.  Pt here today complaining of some LE edema, daytime only on further questioning. Pt c/o salty taste of some foods, is not sure she is on a low-sodium diet. [place] is "poison" but she will eat chocolate.   Wants to know why she is being "detoxed". Reviewed Dr Julianne Rice note, advised pt she is not being detoxed.   Pt has DOE, seems chronic, do not see any recent change. According to staff, she lies in bed all the time, unless strongly encouraged to move. She will stay in [place] all day rather than get up to BR. She has not had headaches or dizziness. She is able to talk volubly without pausing for breath, but becomes SOB when she tries to move herself around. R side chronically weak. Left side also weak, she has trouble raising her L leg.  No chest pain, no palpitations, not aware of afib. No bleeding issues.   Past Medical History  Diagnosis Date  . Diabetes mellitus   . Asthma   . Chronic knee pain   . Back pain, chronic   . Hypertension   . Vertigo   . Hyperlipidemia   . TIA (transient ischemic attack)   . Anemia   . HOH (hard of hearing)   . Arthritis   . Obesity   . PMB (postmenopausal bleeding) 11/26/2014  . Urge incontinence 11/26/2014  . Thickened endometrium 01/15/2015  . Dysrhythmia   . Stroke [facility])     Mini Stroke    Past Surgical History  Procedure Laterality Date  . Appendectomy    . Ovary surgery    . Cataract extraction w/phaco Left 01/16/2013    Procedure: CATARACT EXTRACTION PHACO AND INTRAOCULAR LENS PLACEMENT (IOC);  Surgeon: Elta Guadeloupe T.  Gershon Crane, MD;  Location: [facility];  Service: Ophthalmology;  Laterality: Left;  CDE:14.37  . Endometrial biopsy  04/03/2013       . Hysteroscopy w/d&c N/A 04/24/2013    Procedure: SUCTION DILATATION AND CURETTAGE /HYSTEROSCOPY;  Surgeon: Jonnie Kind, MD;  Location: [facility];  Service: Gynecology;  Laterality: N/A;  . Knee arthroscopy with medial menisectomy Left 07/26/2013    Procedure: KNEE ARTHROSCOPY WITH PARTIAL MEDIAL MENISECTOMY;  Surgeon: Sanjuana Kava, MD;  Location: [facility];  Service: Orthopedics;  Laterality: Left;  . Endometrial ablation    . Colonoscopy  2009    Dr. Wilford Corner: 4 hyperplastic polyps removed. Small internal hemorrhoids. Recommended 5 year follow-up colonoscopy.  . Colonoscopy N/A 02/24/2015    Procedure: COLONOSCOPY;  Surgeon: Danie Binder, MD;  Location: [facility];  Service: Endoscopy;  Laterality: N/A;  1030  . Hysteroscopy w/d&c N/A 04/02/2015    Procedure: UTERINE CURETTAGE, HYSTEROSCOPY;  Surgeon: Florian Buff, MD;  Location: [facility];  Service: Gynecology;  Laterality: N/A;    Medication Sig  . ACCU-CHEK AVIVA PLUS test strip   . ACCU-CHEK SOFTCLIX LANCETS lancets   . acetaminophen (TYLENOL) 325 MG tablet Take 650 mg by mouth every 6 (six) hours as needed  for mild pain.   . Alcohol Swabs (B-D SINGLE USE SWABS REGULAR) PADS   . Ampicillin-Sulbactam (UNASYN) 3 (2-1) g SOLR injection Inject 3 g as directed every 6 (six) hours. As needed for infection.  Marland Kitchen apixaban (ELIQUIS) 5 MG TABS tablet Take 1 tablet (5 mg total) by mouth 2 (two) times daily.  Marland Kitchen atorvastatin (LIPITOR) 80 MG tablet Take 1 tablet (80 mg total) by mouth daily.  . bisacodyl (DULCOLAX) 5 MG EC tablet Take 5 mg by mouth daily as needed for moderate constipation.  . Blood Glucose Monitoring Suppl (ACCU-CHEK AVIVA PLUS) W/DEVICE KIT   . cholecalciferol (VITAMIN D) 1000 UNITS tablet Take 1 tablet (1,000 Units total) by mouth every morning.  . diltiazem (CARDIZEM CD) 180 MG 24 hr capsule Take 1  capsule (180 mg total) by mouth daily.  . furosemide (LASIX) 20 MG tablet Take 40 mg by mouth 2 (two) times daily.   Marland Kitchen gabapentin (NEURONTIN) 100 MG capsule Take 200 mg by mouth at bedtime.   Marland Kitchen guaiFENesin (ROBITUSSIN) 100 MG/5ML liquid Take 100 mg by mouth every 4 (four) hours.  Marland Kitchen ipratropium-albuterol (DUONEB) 0.5-2.5 (3) MG/3ML SOLN Inhale 3 mLs into the lungs every 6 (six) hours as needed.   Marland Kitchen KLOR-CON M20 20 MEQ tablet Take 20 mEq by mouth daily.   Marland Kitchen lidocaine (LIDODERM) 5 % Place 1 patch onto the skin daily. Remove & Discard patch within 12 hours or as directed by MD  . Linaclotide (LINZESS) 145 MCG CAPS capsule 1 PO 30 mins prior to your first meal (Patient taking differently: Take 145 mcg by mouth daily. )  . linezolid (ZYVOX) 600 MG tablet Take 600 mg by mouth 2 (two) times daily. For 7 days.  Marland Kitchen LINZESS 290 MCG CAPS capsule   . megestrol (MEGACE) 40 MG tablet Take 40 mg by mouth 3 (three) times daily.  Marland Kitchen oxybutynin (DITROPAN-XL) 5 MG 24 hr tablet Take 5 mg by mouth at bedtime.   . risperiDONE (RISPERDAL) 0.5 MG tablet   . TOVIAZ 4 MG TB24 tablet TK 1 T PO D  . TRADJENTA 5 MG TABS tablet Take 5 mg by mouth daily.   . traMADol (ULTRAM) 50 MG tablet Take by mouth every 6 (six) hours as needed. Reported on 11/19/2015  . VENTOLIN HFA 108 (90 BASE) MCG/ACT inhaler Inhale 2 puffs into the lungs every 4 (four) hours as needed.   Marland Kitchen ZOSYN 3-0.375 GM/50ML IVPB    No current facility-administered medications for this visit.    Allergies:   Imitrex; Mango flavor; and Oysters    Social History:  The patient  reports that she quit smoking about 9 months ago. Her smoking use included Cigarettes. She has a 7.5 pack-year smoking history. She has never used smokeless tobacco. She reports that she does not drink alcohol or use illicit drugs.   Family History:  The patient's family history includes ADD / ADHD in her son; Arrhythmia in her father; Arthritis in her paternal grandfather; Diabetes in her  maternal grandmother, mother, sister, and sister; Heart failure in her mother; Hypertension in her brother and sister; Lupus in her daughter; Mental illness in her son; Other in her mother, sister, and son; Stroke in her paternal grandmother. There is no history of Colon cancer.    ROS:  Please see the history of present illness. All other systems are reviewed and negative.    PHYSICAL EXAM: VS:  BP 126/62 mmHg  Pulse 85  Ht 5' (1.524 m)  [facility]  265 lb (120.203 kg)  BMI 51.75 kg/m2  SpO2 99%  LMP 03/31/2013 , BMI Body mass index is 51.75 kg/(m^2). GEN: Well nourished, morbidly obese, female in no acute distress HEENT: normal for age  Neck: no JVD seen but difficult to assess 2nd body habitus, no carotid bruit, no masses Cardiac: Irreg R&R; soft murmur, no rubs, or gallops Respiratory:  Few rales bases bilaterally, normal work of breathing GI: soft, nontender, nondistended, + BS MS: no deformity or atrophy; trace-1+ LE edema, slightly more on R; distal pulses are 2+ in all 4 extremities  Skin: warm and dry, no rash Neuro:  Strength and sensation are intact Psych: euthymic mood, full affect   EKG:  EKG is ordered today. The ekg ordered today demonstrates atrial fib, rate 100, no acute changes  LA diameter 32 mm, EF 60-65% w/ grade 1 dd by echo 07/2015  Recent Labs: 01/01/2016: ALT 23; B Natriuretic Peptide 124.0*; BUN 15; Creatinine, Ser 1.05*; Hemoglobin 13.9; Platelets 191; Potassium 3.5; Sodium 144    Lipid Panel    Component Value Date/Time   CHOL 207* 05/11/2015 0253   TRIG 75 05/11/2015 0253   HDL 44 05/11/2015 0253   CHOLHDL 4.7 05/11/2015 0253   VLDL 15 05/11/2015 0253   LDLCALC 148* 05/11/2015 0253     Wt Readings from Last 3 Encounters:  02/19/16 265 lb (120.203 kg)  01/21/16 270 lb (122.471 kg)  09/19/15 267 lb 9.6 oz (121.383 kg)     Other studies Reviewed: Additional studies/ records that were reviewed today include: hospital records, office  notes..  ASSESSMENT AND PLAN:  1.  Atrial fib: rate generally well-controlled. She is on Cardizem CD 180 mg and Eliquis. No specific symptoms coming from this. Continue rate control and anticoagulation.   2. HTN: BP is well-controlled, no med changes  3. Chest pain: no recent history of chest pain with exertion.  4. Dementia and other medical issues: Dr Maudie Mercury spent a great deal of time reviewing these issues with the patient and her daughter.   Current medicines are reviewed at length with the patient today.  The patient does not have concerns regarding medicines.  The following changes have been made:  no change  Labs/ tests ordered today include:  No orders of the defined types were placed in this encounter.     Disposition:   FU with Dr Harl Bowie  Signed, Rosaria Ferries, PA-C  02/19/2016 1:37 PM    [place] Phone: [phone]; Fax: [phone]  This note was written with the assistance of speech recognition software. Please excuse any transcriptional errors.

## 2016-02-20 DIAGNOSIS — M6281 Muscle weakness (generalized): Secondary | ICD-10-CM | POA: Diagnosis not present

## 2016-02-20 DIAGNOSIS — I639 Cerebral infarction, unspecified: Secondary | ICD-10-CM | POA: Diagnosis not present

## 2016-02-23 DIAGNOSIS — I639 Cerebral infarction, unspecified: Secondary | ICD-10-CM | POA: Diagnosis not present

## 2016-02-23 DIAGNOSIS — M6281 Muscle weakness (generalized): Secondary | ICD-10-CM | POA: Diagnosis not present

## 2016-02-24 DIAGNOSIS — Z79899 Other long term (current) drug therapy: Secondary | ICD-10-CM | POA: Diagnosis not present

## 2016-02-24 DIAGNOSIS — R4182 Altered mental status, unspecified: Secondary | ICD-10-CM | POA: Diagnosis not present

## 2016-02-24 DIAGNOSIS — E08319 Diabetes mellitus due to underlying condition with unspecified diabetic retinopathy without macular edema: Secondary | ICD-10-CM | POA: Diagnosis not present

## 2016-02-24 DIAGNOSIS — D649 Anemia, unspecified: Secondary | ICD-10-CM | POA: Diagnosis not present

## 2016-03-03 ENCOUNTER — Ambulatory Visit (INDEPENDENT_AMBULATORY_CARE_PROVIDER_SITE_OTHER): Payer: Commercial Managed Care - HMO | Admitting: Orthopaedic Surgery

## 2016-03-03 ENCOUNTER — Encounter: Payer: Self-pay | Admitting: Orthopaedic Surgery

## 2016-03-03 VITALS — BP 143/115 | HR 92 | Temp 97.2°F | Ht 60.0 in

## 2016-03-03 DIAGNOSIS — M25561 Pain in right knee: Secondary | ICD-10-CM | POA: Diagnosis not present

## 2016-03-03 NOTE — Progress Notes (Signed)
Patient [name], female DOB:1941-06-16, 75 y.o. LPF:[id number]  Chief Complaint  Patient presents with  . Knee Pain    right knee, follow up    HPI  [name] is a 75 y.o. female who is seen in follow-up for her right knee pain.  She has no new trauma, no falls.  She has swelling and popping of the right knee but no giving way, no locking, no redness.  She is at [facility].  She has left sided hemi-paresis from old CVA.  They are working with the right knee and right arm in therapy.   HPI  There is no weight on file to calculate BMI.  Review of Systems  Constitutional:        Patient does not have Diabetes Mellitus. Patient has hypertension. Patient has COPD or shortness of breath. Patient has BMI > 35. Patient does not have current smoking history.  HENT: Negative for congestion.        Hard of hearing  Respiratory: Positive for cough, shortness of breath and wheezing.   Cardiovascular: Negative for chest pain and leg swelling.       Hypertension, Atrial Fib  Endocrine:       Diabetes mellitus  Musculoskeletal: Positive for back pain, joint swelling (right knee swelling) and arthralgias (multiple joints).  Neurological: Negative for numbness.       Old CVA with left sided weakness    Past Medical History  Diagnosis Date  . Diabetes mellitus   . Asthma   . Chronic knee pain   . Back pain, chronic   . Hypertension   . Vertigo   . Hyperlipidemia   . TIA (transient ischemic attack)   . Anemia   . HOH (hard of hearing)   . Arthritis   . Obesity   . PMB (postmenopausal bleeding) 11/26/2014  . Urge incontinence 11/26/2014  . Thickened endometrium 01/15/2015  . Dysrhythmia   . Stroke [facility])     Mini Stroke    Past Surgical History  Procedure Laterality Date  . Appendectomy    . Ovary surgery    . Cataract extraction w/phaco Left 01/16/2013    Procedure: CATARACT EXTRACTION PHACO AND INTRAOCULAR LENS PLACEMENT (IOC);  Surgeon: Elta Guadeloupe T.  Gershon Crane, MD;  Location: [facility];  Service: Ophthalmology;  Laterality: Left;  CDE:14.37  . Endometrial biopsy  04/03/2013       . Hysteroscopy w/d&c N/A 04/24/2013    Procedure: SUCTION DILATATION AND CURETTAGE /HYSTEROSCOPY;  Surgeon: Jonnie Kind, MD;  Location: [facility];  Service: Gynecology;  Laterality: N/A;  . Knee arthroscopy with medial menisectomy Left 07/26/2013    Procedure: KNEE ARTHROSCOPY WITH PARTIAL MEDIAL MENISECTOMY;  Surgeon: Sanjuana Kava, MD;  Location: [facility];  Service: Orthopedics;  Laterality: Left;  . Endometrial ablation    . Colonoscopy  2009    Dr. Wilford Corner: 4 hyperplastic polyps removed. Small internal hemorrhoids. Recommended 5 year follow-up colonoscopy.  . Colonoscopy N/A 02/24/2015    Procedure: COLONOSCOPY;  Surgeon: Danie Binder, MD;  Location[facility];  Service: Endoscopy;  Laterality: N/A;  1030  . Hysteroscopy w/d&c N/A 04/02/2015    Procedure: UTERINE CURETTAGE, HYSTEROSCOPY;  Surgeon: Florian Buff, MD;  Location: [facility];  Service: Gynecology;  Laterality: N/A;    Family History  Problem Relation Age of Onset  . Arrhythmia Father     Atrial fib/Pacemaker  . Heart failure Mother   . Diabetes Mother   . Other  Mother     fell and broke hip  . Diabetes Sister   . Lupus Daughter   . Mental illness Son   . ADD / ADHD Son   . Other Son     soft bones  . Diabetes Maternal Grandmother   . Stroke Paternal Grandmother   . Arthritis Paternal Grandfather   . Other Sister     MVA  . Hypertension Brother   . Diabetes Sister   . Hypertension Sister   . Colon cancer Neg Hx     Social History Social History  Substance Use Topics  . Smoking status: Former Smoker -- 0.25 packs/day for 30 years    Types: Cigarettes    Quit date: 05/20/2015  . Smokeless tobacco: Never Used  . Alcohol Use: No    Allergies  Allergen Reactions  . Imitrex [Sumatriptan] Other (See Comments)    Unknown Reaction   . Mango Flavor     Nausea and vomiting  .  Oysters [Shellfish Allergy]     Nausea and vomiting    Current Outpatient Prescriptions  Medication Sig Dispense Refill  . ACCU-CHEK AVIVA PLUS test strip     . ACCU-CHEK SOFTCLIX LANCETS lancets     . acetaminophen (TYLENOL) 325 MG tablet Take 650 mg by mouth every 6 (six) hours as needed for mild pain.     . Alcohol Swabs (B-D SINGLE USE SWABS REGULAR) PADS     . Ampicillin-Sulbactam (UNASYN) 3 (2-1) g SOLR injection Inject 3 g as directed every 6 (six) hours. As needed for infection.    Marland Kitchen apixaban (ELIQUIS) 5 MG TABS tablet Take 1 tablet (5 mg total) by mouth 2 (two) times daily. 60 tablet 3  . atorvastatin (LIPITOR) 80 MG tablet Take 1 tablet (80 mg total) by mouth daily. 90 tablet 3  . bisacodyl (DULCOLAX) 5 MG EC tablet Take 5 mg by mouth daily as needed for moderate constipation.    . Blood Glucose Monitoring Suppl (ACCU-CHEK AVIVA PLUS) W/DEVICE KIT     . cholecalciferol (VITAMIN D) 1000 UNITS tablet Take 1 tablet (1,000 Units total) by mouth every morning. 30 tablet 1  . diltiazem (CARDIZEM CD) 180 MG 24 hr capsule Take 1 capsule (180 mg total) by mouth daily. 90 capsule 3  . furosemide (LASIX) 20 MG tablet Take 40 mg by mouth 2 (two) times daily.     Marland Kitchen gabapentin (NEURONTIN) 100 MG capsule Take 200 mg by mouth at bedtime.     Marland Kitchen guaiFENesin (ROBITUSSIN) 100 MG/5ML liquid Take 100 mg by mouth every 4 (four) hours.    Marland Kitchen ipratropium-albuterol (DUONEB) 0.5-2.5 (3) MG/3ML SOLN Inhale 3 mLs into the lungs every 6 (six) hours as needed.     Marland Kitchen KLOR-CON M20 20 MEQ tablet Take 20 mEq by mouth daily.     Marland Kitchen lidocaine (LIDODERM) 5 % Place 1 patch onto the skin daily. Remove & Discard patch within 12 hours or as directed by MD    . Linaclotide (LINZESS) 145 MCG CAPS capsule 1 PO 30 mins prior to your first meal (Patient taking differently: Take 145 mcg by mouth daily. ) 30 capsule 11  . linezolid (ZYVOX) 600 MG tablet Take 600 mg by mouth 2 (two) times daily. For 7 days.    Marland Kitchen LINZESS 290 MCG CAPS  capsule     . megestrol (MEGACE) 40 MG tablet Take 40 mg by mouth 3 (three) times daily.    Marland Kitchen oxybutynin (DITROPAN-XL) 5 MG 24 hr tablet  Take 5 mg by mouth at bedtime.     . risperiDONE (RISPERDAL) 0.5 MG tablet     . TOVIAZ 4 MG TB24 tablet TK 1 T PO D  3  . TRADJENTA 5 MG TABS tablet Take 5 mg by mouth daily.   0  . traMADol (ULTRAM) 50 MG tablet Take by mouth every 6 (six) hours as needed. Reported on 11/19/2015    . VENTOLIN HFA 108 (90 BASE) MCG/ACT inhaler Inhale 2 puffs into the lungs every 4 (four) hours as needed.     Marland Kitchen ZOSYN 3-0.375 GM/50ML IVPB      No current facility-administered medications for this visit.     Physical Exam  Blood pressure 143/115, pulse 92, temperature 97.2 F (36.2 C), temperature source Tympanic, height 5' (1.524 m), last menstrual period 03/31/2013.  Constitutional: overall normal hygiene, normal nutrition, well developed, normal grooming, normal body habitus. Assistive device:wheelchair.  Difficulty in standing  Musculoskeletal: gait and station Limp confined to wheelchair, muscle tone and strength are normal on the right but she has hemi-paresis on the left, no tremors or atrophy is present.  .  Neurological: coordination overall normal.  Deep tendon reflex/nerve stretch intact.  Sensation normal.  Cranial nerves II-XII intact.   Skin:   normal overall no scars, lesions, ulcers or rashes. No psoriasis.  Psychiatric: Alert and oriented x 3.  Recent memory intact, remote memory unclear.  Normal mood and affect. Well groomed.  Good eye contact.  Cardiovascular: overall no swelling, no varicosities, no edema bilaterally, normal temperatures of the legs and arms, no clubbing, cyanosis and good capillary refill.  Lymphatic: palpation is normal.  The right lower extremity is examined:  Inspection:  Thigh:  Non-tender and no defects  Knee has swelling 2+ effusion.                        Joint tenderness is present                        Patient is  tender over the medial joint line  Lower Leg:  Has normal appearance and no tenderness or defects  Ankle:  Non-tender and no defects  Foot:  Non-tender and no defects Range of Motion:  Knee:  Range of motion is: 0-100                        Crepitus is  present  Ankle:  Range of motion is normal. Strength and Tone:  The right lower extremity has normal strength and tone. Stability:  Knee:  The knee is stable.  Ankle:  The ankle is stable.    The patient has been educated about the nature of the problem(s) and counseled on treatment options.  The patient appeared to understand what I have discussed and is in agreement with it.  Encounter Diagnosis  Name Primary?  . Right knee pain Yes    PLAN Call if any problems.  Precautions discussed.  Continue current medications.   Return to clinic 3 months

## 2016-03-09 DIAGNOSIS — R4182 Altered mental status, unspecified: Secondary | ICD-10-CM | POA: Diagnosis not present

## 2016-03-09 DIAGNOSIS — D509 Iron deficiency anemia, unspecified: Secondary | ICD-10-CM | POA: Diagnosis not present

## 2016-03-16 DIAGNOSIS — R4182 Altered mental status, unspecified: Secondary | ICD-10-CM | POA: Diagnosis not present

## 2016-03-16 DIAGNOSIS — D509 Iron deficiency anemia, unspecified: Secondary | ICD-10-CM | POA: Diagnosis not present

## 2016-03-23 DIAGNOSIS — R4182 Altered mental status, unspecified: Secondary | ICD-10-CM | POA: Diagnosis not present

## 2016-03-23 DIAGNOSIS — D509 Iron deficiency anemia, unspecified: Secondary | ICD-10-CM | POA: Diagnosis not present

## 2016-03-24 DIAGNOSIS — I639 Cerebral infarction, unspecified: Secondary | ICD-10-CM | POA: Diagnosis not present

## 2016-03-24 DIAGNOSIS — M6281 Muscle weakness (generalized): Secondary | ICD-10-CM | POA: Diagnosis not present

## 2016-03-26 DIAGNOSIS — M6281 Muscle weakness (generalized): Secondary | ICD-10-CM | POA: Diagnosis not present

## 2016-03-26 DIAGNOSIS — I639 Cerebral infarction, unspecified: Secondary | ICD-10-CM | POA: Diagnosis not present

## 2016-03-27 DIAGNOSIS — M25572 Pain in left ankle and joints of left foot: Secondary | ICD-10-CM | POA: Diagnosis not present

## 2016-03-27 DIAGNOSIS — M1712 Unilateral primary osteoarthritis, left knee: Secondary | ICD-10-CM | POA: Diagnosis not present

## 2016-03-27 IMAGING — CT CT HEAD W/O CM
1 series · 16 of 30 positions shown, 20 images · non-contrast
Comparison: Head CT dated 05/10/2015 and brain MR dated 05/10/2015.

CLINICAL DATA: Left arm weakness today with possible mild slurred
speech. Recent stroke with left arm weakness.

EXAM:
CT HEAD WITHOUT CONTRAST
TECHNIQUE: Contiguous axial images were obtained from the base of the skull
through the vertex without intravenous contrast.

[Series 2: headseq 4.8 h37s · axial · 0.46mm/px · z∈[+52,+185]mm · 16 of 30 slices shown, 20 images]
[im 2/30  brain]
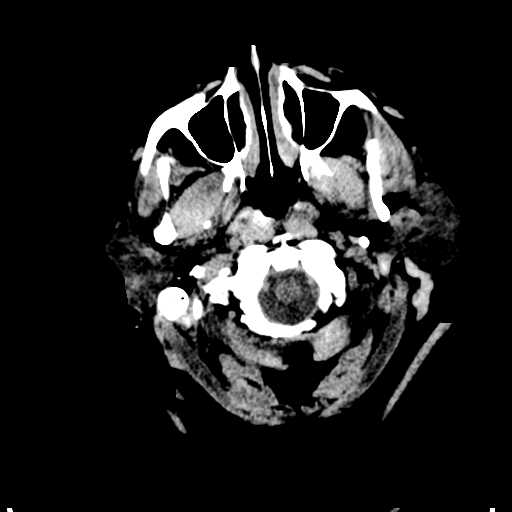
[im 2/30  bone]
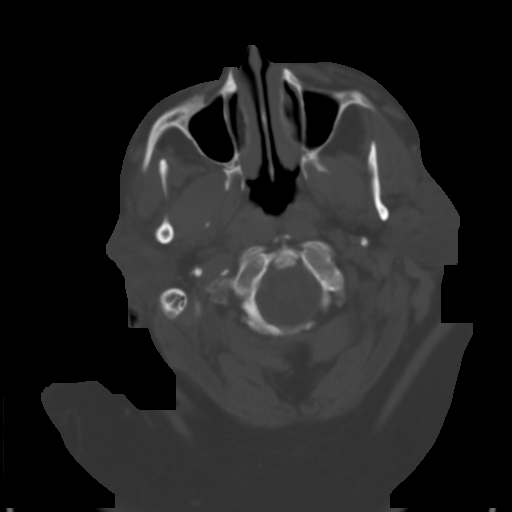
[im 4/30  brain]
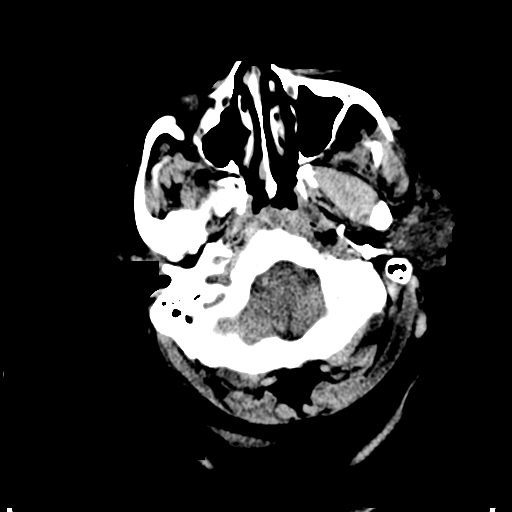
[im 6/30  brain]
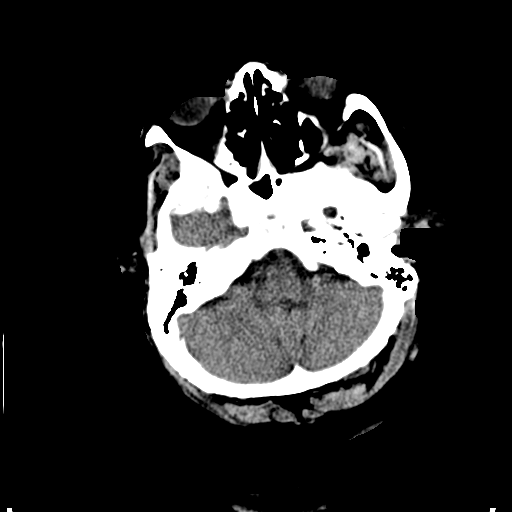
[im 8/30  brain]
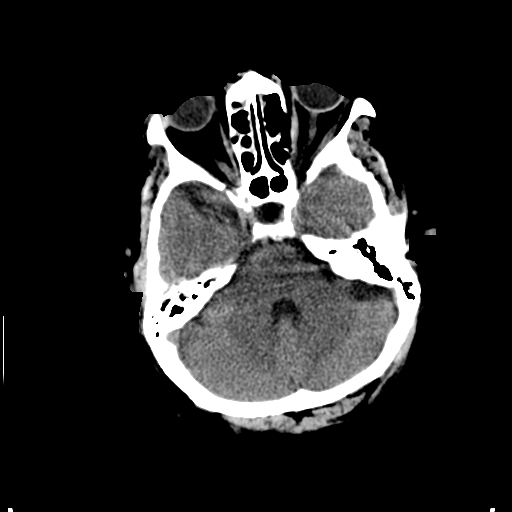
[im 9/30  brain]
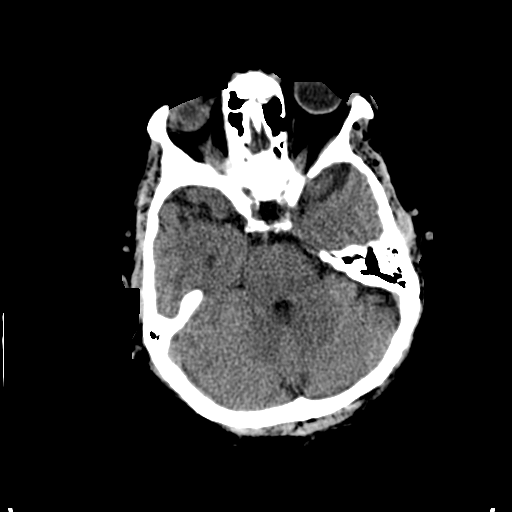
[im 9/30  bone]
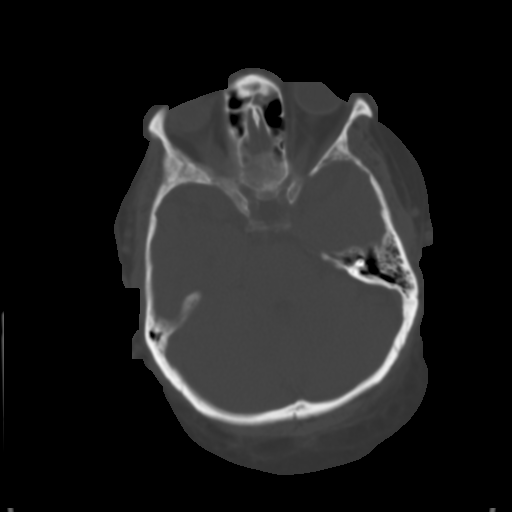
[im 11/30  brain]
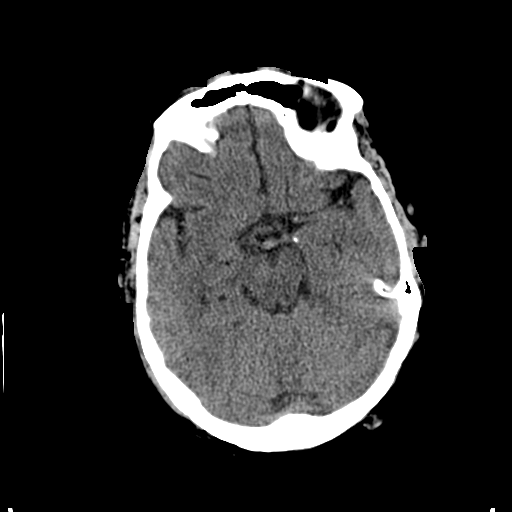
[im 13/30  brain]
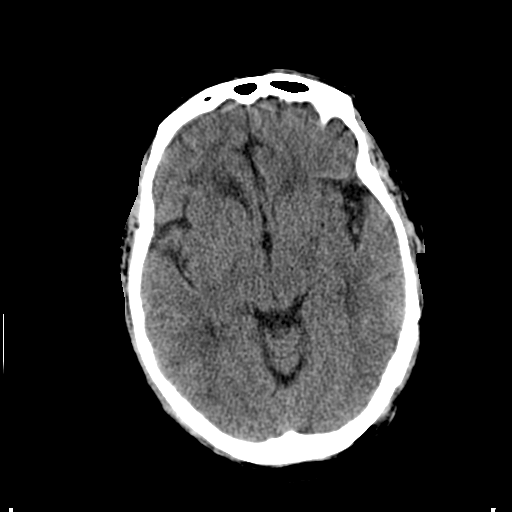
[im 15/30  brain]
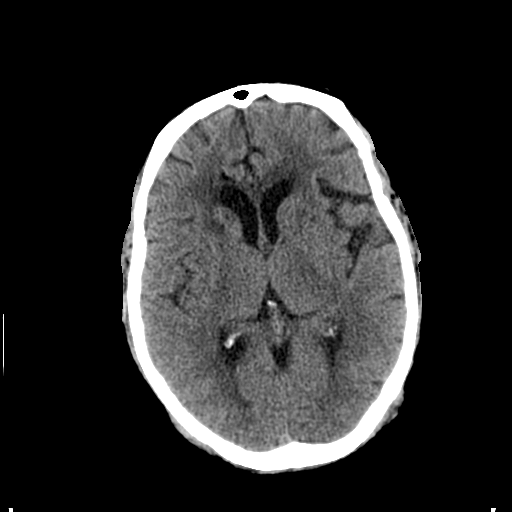
[im 16/30  brain]
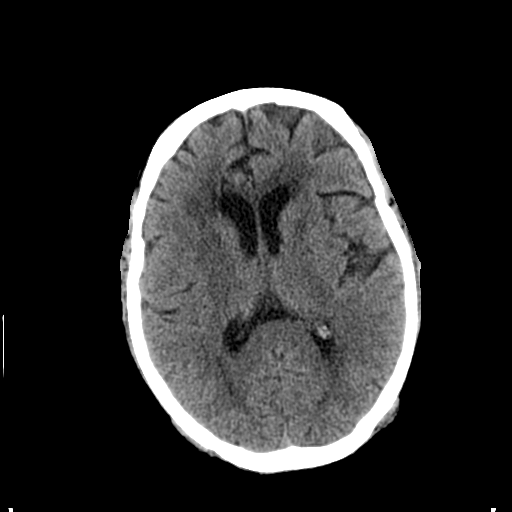
[im 16/30  bone]
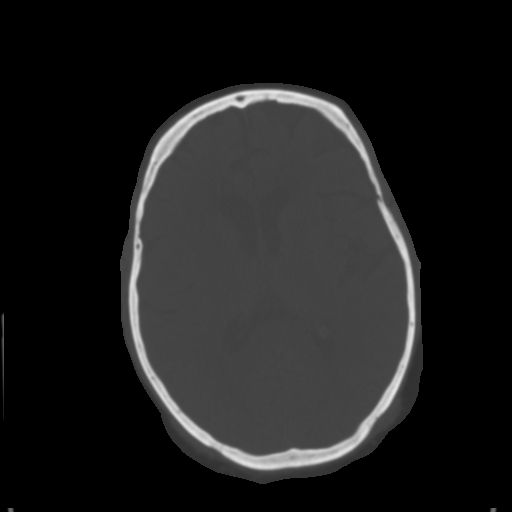
[im 18/30  brain]
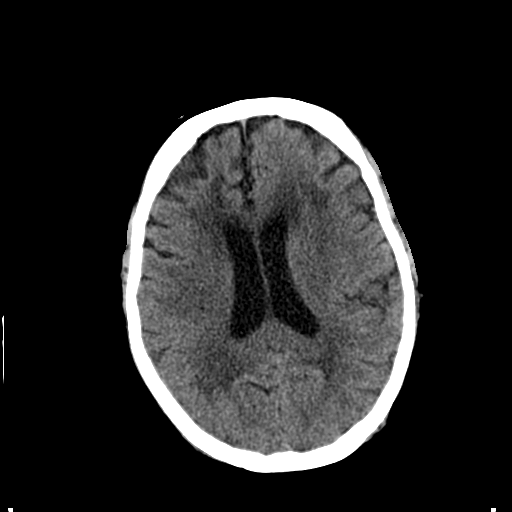
[im 20/30  brain]
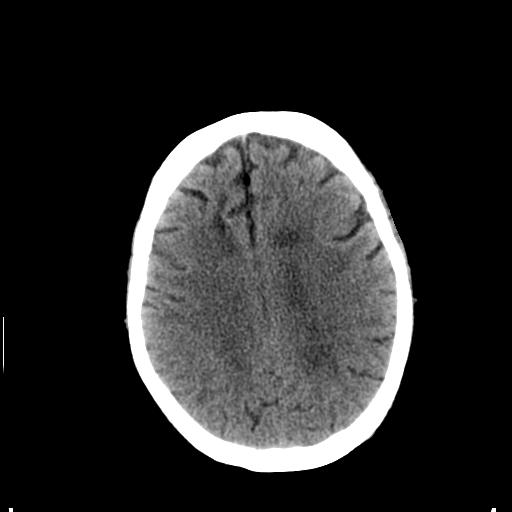
[im 22/30  brain]
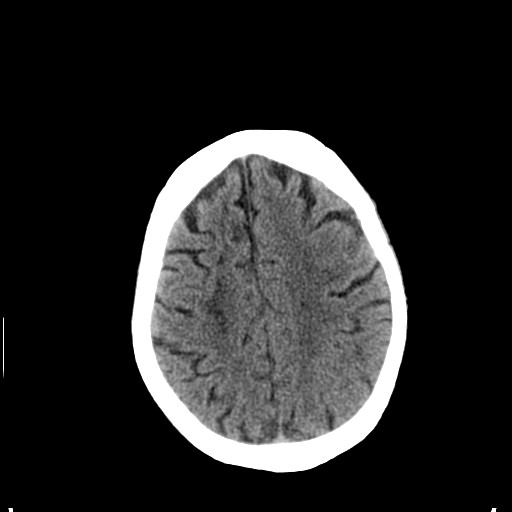
[im 23/30  brain]
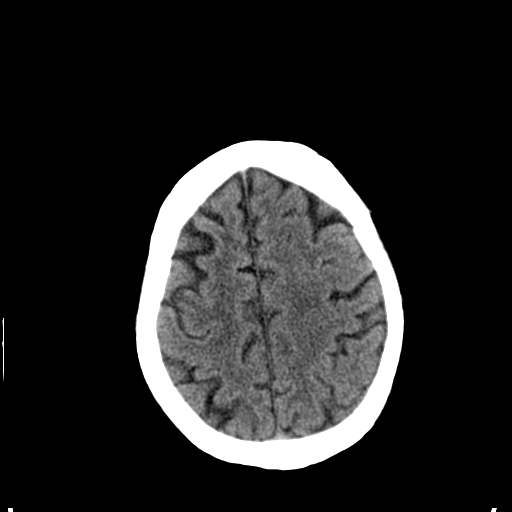
[im 23/30  bone]
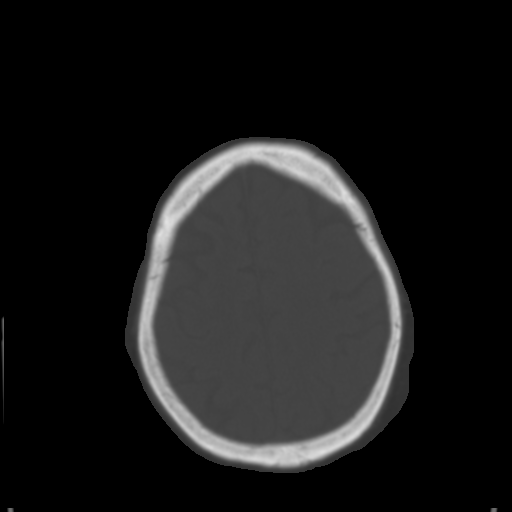
[im 25/30  brain]
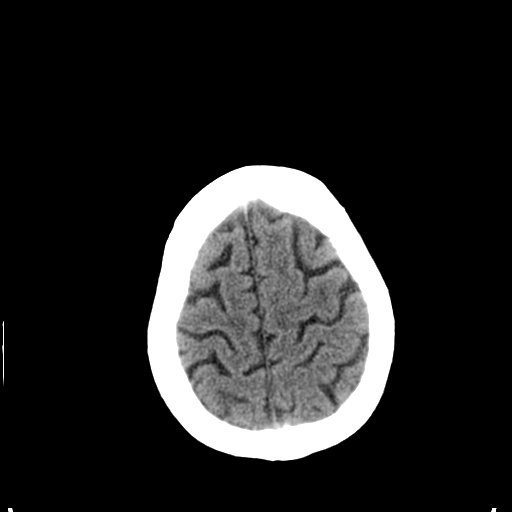
[im 27/30  brain]
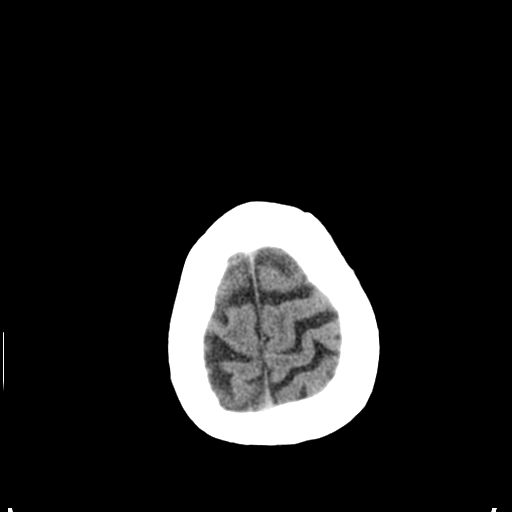
[im 29/30  brain]
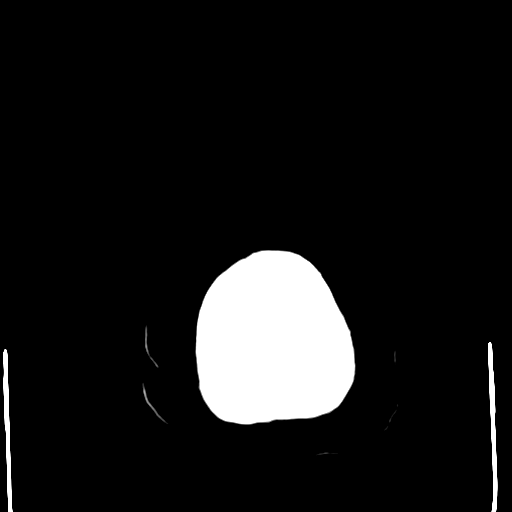

[16 of 30 positions shown; findings below may reference images not displayed]

FINDINGS: Diffusely enlarged ventricles and subarachnoid spaces. Patchy white
matter low density in both cerebral hemispheres. Old right caudate
and left anterior limb internal capsule and left corona radiata
lacunar infarcts. No intracranial hemorrhage, mass lesion or CT
evidence of acute infarction. Unremarkable bones and included
paranasal sinuses.
IMPRESSION: 1. No acute abnormality.
2. Stable mild atrophy and extensive chronic small vessel white
matter ischemic changes in both cerebral hemispheres.
3. Old lacunar infarcts, as described above.

## 2016-03-29 ENCOUNTER — Encounter: Payer: Commercial Managed Care - HMO | Attending: Physical Medicine & Rehabilitation

## 2016-03-29 ENCOUNTER — Ambulatory Visit (HOSPITAL_BASED_OUTPATIENT_CLINIC_OR_DEPARTMENT_OTHER): Payer: Commercial Managed Care - HMO | Admitting: Physical Medicine & Rehabilitation

## 2016-03-29 ENCOUNTER — Encounter: Payer: Self-pay | Admitting: Physical Medicine & Rehabilitation

## 2016-03-29 VITALS — BP 125/69 | HR 68

## 2016-03-29 DIAGNOSIS — E785 Hyperlipidemia, unspecified: Secondary | ICD-10-CM | POA: Insufficient documentation

## 2016-03-29 DIAGNOSIS — I69854 Hemiplegia and hemiparesis following other cerebrovascular disease affecting left non-dominant side: Secondary | ICD-10-CM | POA: Diagnosis not present

## 2016-03-29 DIAGNOSIS — M6281 Muscle weakness (generalized): Secondary | ICD-10-CM | POA: Diagnosis not present

## 2016-03-29 DIAGNOSIS — I10 Essential (primary) hypertension: Secondary | ICD-10-CM | POA: Insufficient documentation

## 2016-03-29 DIAGNOSIS — G8110 Spastic hemiplegia affecting unspecified side: Secondary | ICD-10-CM

## 2016-03-29 DIAGNOSIS — M1990 Unspecified osteoarthritis, unspecified site: Secondary | ICD-10-CM | POA: Insufficient documentation

## 2016-03-29 DIAGNOSIS — E119 Type 2 diabetes mellitus without complications: Secondary | ICD-10-CM | POA: Insufficient documentation

## 2016-03-29 DIAGNOSIS — M1000 Idiopathic gout, unspecified site: Secondary | ICD-10-CM | POA: Diagnosis not present

## 2016-03-29 DIAGNOSIS — M79642 Pain in left hand: Secondary | ICD-10-CM | POA: Insufficient documentation

## 2016-03-29 DIAGNOSIS — I639 Cerebral infarction, unspecified: Secondary | ICD-10-CM | POA: Diagnosis not present

## 2016-03-29 DIAGNOSIS — G8114 Spastic hemiplegia affecting left nondominant side: Secondary | ICD-10-CM | POA: Diagnosis not present

## 2016-03-29 NOTE — Progress Notes (Signed)
Botox Injection for spasticity using needle EMG guidance  Dilution: 50 Units/ml Indication: Severe spasticity which interferes with ADL,mobility and/or  hygiene and is unresponsive to medication management and other conservative care Informed consent was obtained after describing risks and benefits of the procedure with the patient. This includes bleeding, bruising, infection, excessive weakness, or medication side effects. A REMS form is on file and signed. Needle: 25g 2" needle electrode Number of units per muscle  Biceps100  FDS25 FDP25 Pronator teres 50  Patient tolerated procedure well, postprocedure instructions given. Return in 3 months for repeat procedure same dosing and muscles.

## 2016-03-29 NOTE — Patient Instructions (Signed)
Botox Injection for spasticity using needle EMG guidance  Dilution: 50 Units/ml Indication: Severe spasticity which interferes with ADL,mobility and/or  hygiene and is unresponsive to medication management and other conservative care Informed consent was obtained after describing risks and benefits of the procedure with the patient. This includes bleeding, bruising, infection, excessive weakness, or medication side effects. A REMS form is on file and signed. Needle: 25g 2" needle electrode Number of units per muscle  Biceps100  FDS25 FDP25 Pronator teres 50

## 2016-03-30 DIAGNOSIS — I639 Cerebral infarction, unspecified: Secondary | ICD-10-CM | POA: Diagnosis not present

## 2016-03-30 DIAGNOSIS — M6281 Muscle weakness (generalized): Secondary | ICD-10-CM | POA: Diagnosis not present

## 2016-03-31 DIAGNOSIS — I639 Cerebral infarction, unspecified: Secondary | ICD-10-CM | POA: Diagnosis not present

## 2016-03-31 DIAGNOSIS — M6281 Muscle weakness (generalized): Secondary | ICD-10-CM | POA: Diagnosis not present

## 2016-04-01 DIAGNOSIS — I639 Cerebral infarction, unspecified: Secondary | ICD-10-CM | POA: Diagnosis not present

## 2016-04-01 DIAGNOSIS — M6281 Muscle weakness (generalized): Secondary | ICD-10-CM | POA: Diagnosis not present

## 2016-04-02 DIAGNOSIS — I639 Cerebral infarction, unspecified: Secondary | ICD-10-CM | POA: Diagnosis not present

## 2016-04-02 DIAGNOSIS — M6281 Muscle weakness (generalized): Secondary | ICD-10-CM | POA: Diagnosis not present

## 2016-04-05 DIAGNOSIS — D509 Iron deficiency anemia, unspecified: Secondary | ICD-10-CM | POA: Diagnosis not present

## 2016-04-05 DIAGNOSIS — I639 Cerebral infarction, unspecified: Secondary | ICD-10-CM | POA: Diagnosis not present

## 2016-04-05 DIAGNOSIS — M6281 Muscle weakness (generalized): Secondary | ICD-10-CM | POA: Diagnosis not present

## 2016-04-06 DIAGNOSIS — I639 Cerebral infarction, unspecified: Secondary | ICD-10-CM | POA: Diagnosis not present

## 2016-04-06 DIAGNOSIS — M6281 Muscle weakness (generalized): Secondary | ICD-10-CM | POA: Diagnosis not present

## 2016-04-07 DIAGNOSIS — M6281 Muscle weakness (generalized): Secondary | ICD-10-CM | POA: Diagnosis not present

## 2016-04-07 DIAGNOSIS — I639 Cerebral infarction, unspecified: Secondary | ICD-10-CM | POA: Diagnosis not present

## 2016-04-08 DIAGNOSIS — M6281 Muscle weakness (generalized): Secondary | ICD-10-CM | POA: Diagnosis not present

## 2016-04-08 DIAGNOSIS — I639 Cerebral infarction, unspecified: Secondary | ICD-10-CM | POA: Diagnosis not present

## 2016-04-09 DIAGNOSIS — M6281 Muscle weakness (generalized): Secondary | ICD-10-CM | POA: Diagnosis not present

## 2016-04-09 DIAGNOSIS — I639 Cerebral infarction, unspecified: Secondary | ICD-10-CM | POA: Diagnosis not present

## 2016-04-12 ENCOUNTER — Encounter: Payer: Self-pay | Admitting: Cardiology

## 2016-04-12 DIAGNOSIS — D649 Anemia, unspecified: Secondary | ICD-10-CM | POA: Diagnosis not present

## 2016-04-12 DIAGNOSIS — M6281 Muscle weakness (generalized): Secondary | ICD-10-CM | POA: Diagnosis not present

## 2016-04-12 DIAGNOSIS — I480 Paroxysmal atrial fibrillation: Secondary | ICD-10-CM | POA: Diagnosis not present

## 2016-04-12 DIAGNOSIS — I10 Essential (primary) hypertension: Secondary | ICD-10-CM | POA: Diagnosis not present

## 2016-04-12 DIAGNOSIS — D509 Iron deficiency anemia, unspecified: Secondary | ICD-10-CM | POA: Diagnosis not present

## 2016-04-12 DIAGNOSIS — E119 Type 2 diabetes mellitus without complications: Secondary | ICD-10-CM | POA: Diagnosis not present

## 2016-04-12 DIAGNOSIS — I639 Cerebral infarction, unspecified: Secondary | ICD-10-CM | POA: Diagnosis not present

## 2016-04-13 DIAGNOSIS — I639 Cerebral infarction, unspecified: Secondary | ICD-10-CM | POA: Diagnosis not present

## 2016-04-13 DIAGNOSIS — M6281 Muscle weakness (generalized): Secondary | ICD-10-CM | POA: Diagnosis not present

## 2016-04-14 DIAGNOSIS — M6281 Muscle weakness (generalized): Secondary | ICD-10-CM | POA: Diagnosis not present

## 2016-04-14 DIAGNOSIS — I639 Cerebral infarction, unspecified: Secondary | ICD-10-CM | POA: Diagnosis not present

## 2016-04-15 DIAGNOSIS — I639 Cerebral infarction, unspecified: Secondary | ICD-10-CM | POA: Diagnosis not present

## 2016-04-15 DIAGNOSIS — M6281 Muscle weakness (generalized): Secondary | ICD-10-CM | POA: Diagnosis not present

## 2016-04-16 DIAGNOSIS — I639 Cerebral infarction, unspecified: Secondary | ICD-10-CM | POA: Diagnosis not present

## 2016-04-16 DIAGNOSIS — M6281 Muscle weakness (generalized): Secondary | ICD-10-CM | POA: Diagnosis not present

## 2016-04-19 DIAGNOSIS — I517 Cardiomegaly: Secondary | ICD-10-CM | POA: Diagnosis not present

## 2016-04-19 DIAGNOSIS — R0989 Other specified symptoms and signs involving the circulatory and respiratory systems: Secondary | ICD-10-CM | POA: Diagnosis not present

## 2016-04-20 ENCOUNTER — Emergency Department (HOSPITAL_COMMUNITY): Payer: Commercial Managed Care - HMO

## 2016-04-20 ENCOUNTER — Encounter (HOSPITAL_COMMUNITY): Payer: Self-pay

## 2016-04-20 ENCOUNTER — Observation Stay (HOSPITAL_COMMUNITY)
Admission: EM | Admit: 2016-04-20 | Discharge: 2016-04-23 | Disposition: A | Discharge status: Home / Self Care | Payer: Commercial Managed Care - HMO | Attending: Internal Medicine | Admitting: Internal Medicine

## 2016-04-20 DIAGNOSIS — E119 Type 2 diabetes mellitus without complications: Secondary | ICD-10-CM | POA: Insufficient documentation

## 2016-04-20 DIAGNOSIS — I639 Cerebral infarction, unspecified: Secondary | ICD-10-CM | POA: Diagnosis not present

## 2016-04-20 DIAGNOSIS — I4891 Unspecified atrial fibrillation: Secondary | ICD-10-CM

## 2016-04-20 DIAGNOSIS — E118 Type 2 diabetes mellitus with unspecified complications: Secondary | ICD-10-CM | POA: Diagnosis present

## 2016-04-20 DIAGNOSIS — R4781 Slurred speech: Secondary | ICD-10-CM | POA: Diagnosis not present

## 2016-04-20 DIAGNOSIS — I10 Essential (primary) hypertension: Secondary | ICD-10-CM | POA: Diagnosis not present

## 2016-04-20 DIAGNOSIS — Z8673 Personal history of transient ischemic attack (TIA), and cerebral infarction without residual deficits: Secondary | ICD-10-CM | POA: Insufficient documentation

## 2016-04-20 DIAGNOSIS — I480 Paroxysmal atrial fibrillation: Secondary | ICD-10-CM | POA: Diagnosis present

## 2016-04-20 DIAGNOSIS — E785 Hyperlipidemia, unspecified: Secondary | ICD-10-CM | POA: Insufficient documentation

## 2016-04-20 DIAGNOSIS — I6789 Other cerebrovascular disease: Secondary | ICD-10-CM | POA: Diagnosis not present

## 2016-04-20 DIAGNOSIS — M1990 Unspecified osteoarthritis, unspecified site: Secondary | ICD-10-CM | POA: Insufficient documentation

## 2016-04-20 DIAGNOSIS — Z87891 Personal history of nicotine dependence: Secondary | ICD-10-CM | POA: Diagnosis not present

## 2016-04-20 DIAGNOSIS — R4182 Altered mental status, unspecified: Secondary | ICD-10-CM | POA: Diagnosis not present

## 2016-04-20 DIAGNOSIS — E669 Obesity, unspecified: Secondary | ICD-10-CM | POA: Insufficient documentation

## 2016-04-20 DIAGNOSIS — R479 Unspecified speech disturbances: Secondary | ICD-10-CM

## 2016-04-20 DIAGNOSIS — I69354 Hemiplegia and hemiparesis following cerebral infarction affecting left non-dominant side: Secondary | ICD-10-CM

## 2016-04-20 DIAGNOSIS — R0682 Tachypnea, not elsewhere classified: Secondary | ICD-10-CM | POA: Diagnosis not present

## 2016-04-20 DIAGNOSIS — J45909 Unspecified asthma, uncomplicated: Secondary | ICD-10-CM | POA: Diagnosis not present

## 2016-04-20 DIAGNOSIS — M6281 Muscle weakness (generalized): Secondary | ICD-10-CM | POA: Diagnosis not present

## 2016-04-20 DIAGNOSIS — I517 Cardiomegaly: Secondary | ICD-10-CM | POA: Diagnosis not present

## 2016-04-20 DIAGNOSIS — IMO0002 Reserved for concepts with insufficient information to code with codable children: Secondary | ICD-10-CM

## 2016-04-20 DIAGNOSIS — Z79899 Other long term (current) drug therapy: Secondary | ICD-10-CM | POA: Insufficient documentation

## 2016-04-20 LAB — CBC WITH DIFFERENTIAL/PLATELET
Basophils Absolute: 0 10*3/uL (ref 0.0–0.1)
Basophils Relative: 0 %
Eosinophils Absolute: 0 10*3/uL (ref 0.0–0.7)
Eosinophils Relative: 1 %
HCT: 43.7 % (ref 36.0–46.0)
Hemoglobin: 14.9 g/dL (ref 12.0–15.0)
Lymphocytes Relative: 38 %
Lymphs Abs: 1.5 10*3/uL (ref 0.7–4.0)
MCH: 33.9 pg (ref 26.0–34.0)
MCHC: 34.1 g/dL (ref 30.0–36.0)
MCV: 99.3 fL (ref 78.0–100.0)
Monocytes Absolute: 0.4 10*3/uL (ref 0.1–1.0)
Monocytes Relative: 10 %
Neutro Abs: 2.1 10*3/uL (ref 1.7–7.7)
Neutrophils Relative %: 51 %
Platelets: 177 10*3/uL (ref 150–400)
RBC: 4.4 MIL/uL (ref 3.87–5.11)
RDW: 13 % (ref 11.5–15.5)
WBC: 4 10*3/uL (ref 4.0–10.5)

## 2016-04-20 LAB — COMPREHENSIVE METABOLIC PANEL
ALT: 20 U/L (ref 14–54)
AST: 17 U/L (ref 15–41)
Albumin: 3.6 g/dL (ref 3.5–5.0)
Alkaline Phosphatase: 50 U/L (ref 38–126)
Anion gap: 8 (ref 5–15)
BUN: 16 mg/dL (ref 6–20)
CO2: 27 mmol/L (ref 22–32)
Calcium: 9.4 mg/dL (ref 8.9–10.3)
Chloride: 104 mmol/L (ref 101–111)
Creatinine, Ser: 1.01 mg/dL — ABNORMAL HIGH (ref 0.44–1.00)
GFR calc Af Amer: >60 mL/min (ref >60)
GFR calc non Af Amer: 54 mL/min — ABNORMAL LOW (ref >60)
Glucose, Bld: 85 mg/dL (ref 65–99)
Potassium: 3.6 mmol/L (ref 3.5–5.1)
Sodium: 139 mmol/L (ref 135–145)
Total Bilirubin: 1.3 mg/dL — ABNORMAL HIGH (ref 0.3–1.2)
Total Protein: 7.3 g/dL (ref 6.5–8.1)

## 2016-04-20 MED ORDER — DILTIAZEM HCL ER COATED BEADS 180 MG PO CP24
180.0000 mg | ORAL_CAPSULE | Freq: Every day | ORAL | Status: DC
  Administered 2016-04-21 – 2016-04-23 (×3): 180 mg via ORAL
  Filled 2016-04-20 (×3): qty 1

## 2016-04-20 MED ORDER — GABAPENTIN 100 MG PO CAPS
100.0000 mg | ORAL_CAPSULE | Freq: Three times a day (TID) | ORAL | Status: DC
  Administered 2016-04-21 – 2016-04-23 (×8): 100 mg via ORAL
  Filled 2016-04-20 (×8): qty 1

## 2016-04-20 MED ORDER — ALBUTEROL SULFATE (2.5 MG/3ML) 0.083% IN NEBU: 3.0000 mL | INHALATION_SOLUTION | RESPIRATORY_TRACT | Status: DC | PRN

## 2016-04-20 MED ORDER — ASPIRIN 325 MG PO TABS
325.0000 mg | ORAL_TABLET | Freq: Every day | ORAL | Status: DC
  Administered 2016-04-21 – 2016-04-22 (×2): 325 mg via ORAL
  Filled 2016-04-20 (×2): qty 1

## 2016-04-20 MED ORDER — APIXABAN 5 MG PO TABS
5.0000 mg | ORAL_TABLET | Freq: Two times a day (BID) | ORAL | Status: DC
  Administered 2016-04-21 – 2016-04-23 (×6): 5 mg via ORAL
  Filled 2016-04-20 (×6): qty 1

## 2016-04-20 MED ORDER — IPRATROPIUM-ALBUTEROL 0.5-2.5 (3) MG/3ML IN SOLN: 3.0000 mL | Freq: Four times a day (QID) | RESPIRATORY_TRACT | Status: DC | PRN

## 2016-04-20 MED ORDER — ASPIRIN 325 MG PO TABS
325.0000 mg | ORAL_TABLET | Freq: Once | ORAL | Status: AC
  Administered 2016-04-20: 325 mg via ORAL
  Filled 2016-04-20: qty 1

## 2016-04-20 MED ORDER — STROKE: EARLY STAGES OF RECOVERY BOOK
Freq: Once | Status: DC
  Filled 2016-04-20: qty 1

## 2016-04-20 MED ORDER — ASPIRIN 300 MG RE SUPP: 300.0000 mg | Freq: Every day | RECTAL | Status: DC

## 2016-04-20 MED ORDER — ATORVASTATIN CALCIUM 40 MG PO TABS
80.0000 mg | ORAL_TABLET | Freq: Every day | ORAL | Status: DC
  Administered 2016-04-21 – 2016-04-22 (×3): 80 mg via ORAL
  Filled 2016-04-20: qty 2
  Filled 2016-04-20: qty 1
  Filled 2016-04-20 (×2): qty 2

## 2016-04-20 MED ORDER — FUROSEMIDE 40 MG PO TABS
20.0000 mg | ORAL_TABLET | Freq: Two times a day (BID) | ORAL | Status: DC
  Administered 2016-04-21 – 2016-04-23 (×5): 20 mg via ORAL
  Filled 2016-04-20 (×5): qty 1

## 2016-04-20 NOTE — ED Provider Notes (Signed)
CSN: [id number]     Arrival date & time 04/20/16  1804 History   First MD Initiated Contact with Patient 04/20/16 [place]     Chief Complaint  Patient presents with  . Aphasia     (Consider location/radiation/quality/duration/timing/severity/associated sxs/prior Treatment) Patient is a 75 y.o. female presenting with Acute Neurological Problem. The history is provided by the patient (Patient has slurred speech which started 2 days ago. Patient history of stroke weakness on left side).  Cerebrovascular Accident This is a new problem. The current episode started 2 days ago. The problem occurs constantly. The problem has not changed since onset.Pertinent negatives include no chest pain, no abdominal pain and no headaches. Nothing aggravates the symptoms. Nothing relieves the symptoms.    Past Medical History  Diagnosis Date  . Diabetes mellitus   . Asthma   . Chronic knee pain   . Back pain, chronic   . Hypertension   . Vertigo   . Hyperlipidemia   . TIA (transient ischemic attack)   . Anemia   . HOH (hard of hearing)   . Arthritis   . Obesity   . PMB (postmenopausal bleeding) 11/26/2014  . Urge incontinence 11/26/2014  . Thickened endometrium 01/15/2015  . Dysrhythmia   . Stroke [facility])     Mini Stroke   Past Surgical History  Procedure Laterality Date  . Appendectomy    . Ovary surgery    . Cataract extraction w/phaco Left 01/16/2013    Procedure: CATARACT EXTRACTION PHACO AND INTRAOCULAR LENS PLACEMENT (IOC);  Surgeon: Elta Guadeloupe T. Gershon Crane, MD;  Location: [facility];  Service: Ophthalmology;  Laterality: Left;  CDE:14.37  . Endometrial biopsy  04/03/2013       . Hysteroscopy w/d&c N/A 04/24/2013    Procedure: SUCTION DILATATION AND CURETTAGE /HYSTEROSCOPY;  Surgeon: Jonnie Kind, MD;  Location: [facility];  Service: Gynecology;  Laterality: N/A;  . Knee arthroscopy with medial menisectomy Left 07/26/2013    Procedure: KNEE ARTHROSCOPY WITH PARTIAL MEDIAL MENISECTOMY;  Surgeon: Sanjuana Kava, MD;  Location: [facility];  Service: Orthopedics;  Laterality: Left;  . Endometrial ablation    . Colonoscopy  2009    Dr. Wilford Corner: 4 hyperplastic polyps removed. Small internal hemorrhoids. Recommended 5 year follow-up colonoscopy.  . Colonoscopy N/A 02/24/2015    Procedure: COLONOSCOPY;  Surgeon: Danie Binder, MD;  Location: [facility];  Service: Endoscopy;  Laterality: N/A;  1030  . Hysteroscopy w/d&c N/A 04/02/2015    Procedure: UTERINE CURETTAGE, HYSTEROSCOPY;  Surgeon: Florian Buff, MD;  Location: [facility];  Service: Gynecology;  Laterality: N/A;   Family History  Problem Relation Age of Onset  . Arrhythmia Father     Atrial fib/Pacemaker  . Heart failure Mother   . Diabetes Mother   . Other Mother     fell and broke hip  . Diabetes Sister   . Lupus Daughter   . Mental illness Son   . ADD / ADHD Son   . Other Son     soft bones  . Diabetes Maternal Grandmother   . Stroke Paternal Grandmother   . Arthritis Paternal Grandfather   . Other Sister     MVA  . Hypertension Brother   . Diabetes Sister   . Hypertension Sister   . Colon cancer Neg Hx    Social History  Substance Use Topics  . Smoking status: Former Smoker -- 0.25 packs/day for 30 years    Types: Cigarettes    Quit date: 05/20/2015  .  Smokeless tobacco: Never Used  . Alcohol Use: No   OB History    Gravida Para Term Preterm AB TAB SAB Ectopic Multiple Living   '2 2        2     '$ Review of Systems  Constitutional: Negative for appetite change and fatigue.  HENT: Negative for congestion, ear discharge and sinus pressure.   Eyes: Negative for discharge.  Respiratory: Negative for cough.   Cardiovascular: Negative for chest pain.  Gastrointestinal: Negative for abdominal pain and diarrhea.  Genitourinary: Negative for frequency and hematuria.  Musculoskeletal: Negative for back pain.  Skin: Negative for rash.  Neurological: Negative for seizures and headaches.       Slurred speech   Psychiatric/Behavioral: Negative for hallucinations.      Allergies  Imitrex; Mango flavor; and Oysters  Home Medications   Prior to Admission medications   Medication Sig Start Date End Date Taking? Authorizing Provider  acetaminophen (TYLENOL) 325 MG tablet Take 650 mg by mouth every 6 (six) hours as needed for mild pain.    Yes Historical Provider, MD  atorvastatin (LIPITOR) 20 MG tablet Take 80 mg by mouth at bedtime.    Yes Historical Provider, MD  bisacodyl (DULCOLAX) 5 MG EC tablet Take 5 mg by mouth daily as needed for moderate constipation.   Yes Historical Provider, MD  cholecalciferol (VITAMIN D) 1000 UNITS tablet Take 1 tablet (1,000 Units total) by mouth every morning. 05/21/15  Yes Daniel J Angiulli, PA-C  colchicine 0.6 MG tablet Take 0.3 mg by mouth 2 (two) times daily. 03/30/16  Yes Historical Provider, MD  diltiazem (CARDIZEM CD) 180 MG 24 hr capsule Take 1 capsule (180 mg total) by mouth daily. 09/19/15  Yes Arnoldo Lenis, MD  ELIQUIS 5 MG TABS tablet Take 5 mg by mouth 2 (two) times daily. 04/18/16  Yes Historical Provider, MD  furosemide (LASIX) 20 MG tablet Take 20 mg by mouth 2 (two) times daily.  07/15/15  Yes Historical Provider, MD  gabapentin (NEURONTIN) 100 MG capsule Take 100 mg by mouth 3 (three) times daily.  07/09/15  Yes Historical Provider, MD  guaiFENesin (ROBITUSSIN) 100 MG/5ML liquid Take 100 mg by mouth every 4 (four) hours.   Yes Historical Provider, MD  ipratropium-albuterol (DUONEB) 0.5-2.5 (3) MG/3ML SOLN Inhale 3 mLs into the lungs every 6 (six) hours as needed.  10/24/15  Yes Historical Provider, MD  KLOR-CON M20 20 MEQ tablet Take 20 mEq by mouth daily.  07/18/15  Yes Historical Provider, MD  LINZESS 290 MCG CAPS capsule Take 290 mcg by mouth daily before breakfast.  01/27/16  Yes Historical Provider, MD  megestrol (MEGACE) 40 MG tablet Take 40 mg by mouth 3 (three) times daily.   Yes Historical Provider, MD  oxybutynin (DITROPAN-XL) 5 MG 24 hr  tablet Take 5 mg by mouth daily.  09/15/15  Yes Historical Provider, MD  risperiDONE (RISPERDAL) 0.5 MG tablet Take 0.5 mg by mouth 2 (two) times daily.  01/27/16  Yes Historical Provider, MD  TRADJENTA 5 MG TABS tablet Take 5 mg by mouth daily.  02/26/15  Yes Historical Provider, MD  traMADol (ULTRAM) 50 MG tablet Take 50 mg by mouth daily. *Also takes every 6 hours as needed for pain   Yes Historical Provider, MD  VENTOLIN HFA 108 (90 BASE) MCG/ACT inhaler Inhale 2 puffs into the lungs every 4 (four) hours as needed for wheezing or shortness of breath.  07/05/15  Yes Historical Provider, MD   BP 136/73  mmHg  Pulse 92  Temp(Src) 98 F (36.7 C) (Oral)  Resp 15  SpO2 98%  LMP 03/31/2013 Physical Exam  Constitutional: She is oriented to person, place, and time. She appears well-developed.  HENT:  Head: Normocephalic.  Eyes: Conjunctivae and EOM are normal. No scleral icterus.  Neck: Neck supple. No thyromegaly present.  Cardiovascular: Regular rhythm.  Exam reveals no gallop and no friction rub.   No murmur heard. Irregular rate  Pulmonary/Chest: No stridor. She has no wheezes. She has no rales. She exhibits no tenderness.  Abdominal: She exhibits no distension. There is no tenderness. There is no rebound.  Musculoskeletal: Normal range of motion. She exhibits no edema.  Lymphadenopathy:    She has no cervical adenopathy.  Neurological: She is oriented to person, place, and time. She exhibits normal muscle tone. Coordination normal.  Profound weakness in left arm which is old. She had a previous stroke. Patient also has slurred speech which is new from 2 days ago  Skin: No rash noted. No erythema.  Psychiatric: She has a normal mood and affect. Her behavior is normal.    ED Course  Procedures (including critical care time) Labs Review Labs Reviewed  COMPREHENSIVE METABOLIC PANEL - Abnormal; Notable for the following:    Creatinine, Ser 1.01 (*)    Total Bilirubin 1.3 (*)    GFR  calc non Af Amer 54 (*)    All other components within normal limits  CBC WITH DIFFERENTIAL/PLATELET    Imaging Review Ct Head Wo Contrast  04/20/2016  CLINICAL DATA:  Slurred speech for 2 days, initial encounter EXAM: CT HEAD WITHOUT CONTRAST TECHNIQUE: Contiguous axial images were obtained from the base of the skull through the vertex without intravenous contrast. COMPARISON:  12/30/2015 FINDINGS: Bony calvarium is intact. Mild atrophic changes and chronic white matter ischemic changes are again seen and stable. No findings to suggest acute hemorrhage, acute infarction or space-occupying mass lesion are noted. IMPRESSION: Atrophic and ischemic changes.  No acute abnormality is noted. Electronically Signed   By: Inez Catalina M.D.   On: 04/20/2016 18:48   Dg Chest Portable 1 View  04/20/2016  CLINICAL DATA:  Slurred speech.     LEFT-sided weakness. EXAM: PORTABLE CHEST 1 VIEW COMPARISON:  01/01/2016. FINDINGS: Low lung volumes. Cardiomegaly. No active infiltrates or failure. No pneumothorax. Bones unremarkable. IMPRESSION: Cardiomegaly.  Low lung volumes.  No active infiltrates or failure. Electronically Signed   By: Staci Righter M.D.   On: 04/20/2016 19:00   I have personally reviewed and evaluated these images and lab results as part of my medical decision-making.   EKG Interpretation   Date/Time:  Tuesday Apr 20 2016 18:26:02 EDT Ventricular Rate:  104 PR Interval:    QRS Duration: 88 QT Interval:  338 QTC Calculation: 444 R Axis:   50 Text Interpretation:  Atrial fibrillation Low voltage, precordial leads  Confirmed by Sadee Osland  MD, Kenyon Eshleman ([id number]) on 04/20/2016 10:41:41 PM      MDM   Final diagnoses:  Slurred speech    Slurred speech most likely secondary to small stroke. Patient will be admitted telemetry and get an MRI tomorrow    Milton Ferguson, MD 04/20/16 2241

## 2016-04-20 NOTE — ED Notes (Signed)
Pt here from [place] for evaluation of slurred speech that started on Sunday. Pt has had a stroke in the past that left her with left sided weakness

## 2016-04-20 NOTE — H&P (Signed)
PCP:   FANTA,TESFAYE, MD   Chief Complaint:  Slurred speech  HPI: 75 yo female h/o CVA with residual left sided weakness, dm, htn, obesity lives at [facility] sent in for speech problem, slurred since Sunday (over 24 hours).  Pt says she has a hard time saying what she wants to say, and it is better than yesterday.  No fevers.  No new medications.  She  Reports she is basicaally mostly wheelchair bound and is getting physical therapy at [facility].  Denies any recent illnesses.  On eloquis.  Pt referred for admission for concerns of possible new stroke.  She denies any different numbness, tingling or weakness anywhere other than what she normally has on her left side.  Review of Systems:  Positive and negative as per HPI otherwise all other systems are negative  Past Medical History: Past Medical History  Diagnosis Date  . Diabetes mellitus   . Asthma   . Chronic knee pain   . Back pain, chronic   . Hypertension   . Vertigo   . Hyperlipidemia   . TIA (transient ischemic attack)   . Anemia   . HOH (hard of hearing)   . Arthritis   . Obesity   . PMB (postmenopausal bleeding) 11/26/2014  . Urge incontinence 11/26/2014  . Thickened endometrium 01/15/2015  . Dysrhythmia   . Stroke [facility])     Mini Stroke   Past Surgical History  Procedure Laterality Date  . Appendectomy    . Ovary surgery    . Cataract extraction w/phaco Left 01/16/2013    Procedure: CATARACT EXTRACTION PHACO AND INTRAOCULAR LENS PLACEMENT (IOC);  Surgeon: Elta Guadeloupe T. Gershon Crane, MD;  Location: [facility];  Service: Ophthalmology;  Laterality: Left;  CDE:14.37  . Endometrial biopsy  04/03/2013       . Hysteroscopy w/d&c N/A 04/24/2013    Procedure: SUCTION DILATATION AND CURETTAGE /HYSTEROSCOPY;  Surgeon: Jonnie Kind, MD;  Location: [facility];  Service: Gynecology;  Laterality: N/A;  . Knee arthroscopy with medial menisectomy Left 07/26/2013    Procedure: KNEE ARTHROSCOPY WITH PARTIAL MEDIAL MENISECTOMY;  Surgeon: Sanjuana Kava, MD;   Location: [facility];  Service: Orthopedics;  Laterality: Left;  . Endometrial ablation    . Colonoscopy  2009    Dr. Wilford Corner: 4 hyperplastic polyps removed. Small internal hemorrhoids. Recommended 5 year follow-up colonoscopy.  . Colonoscopy N/A 02/24/2015    Procedure: COLONOSCOPY;  Surgeon: Danie Binder, MD;  [facility];  Service: Endoscopy;  Laterality: N/A;  1030  . Hysteroscopy w/d&c N/A 04/02/2015    Procedure: UTERINE CURETTAGE, HYSTEROSCOPY;  Surgeon: Florian Buff, MD;  Location: [facility];  Service: Gynecology;  Laterality: N/A;    Medications: Prior to Admission medications   Medication Sig Start Date End Date Taking? Authorizing Provider  acetaminophen (TYLENOL) 325 MG tablet Take 650 mg by mouth every 6 (six) hours as needed for mild pain.    Yes Historical Provider, MD  atorvastatin (LIPITOR) 20 MG tablet Take 80 mg by mouth at bedtime.    Yes Historical Provider, MD  bisacodyl (DULCOLAX) 5 MG EC tablet Take 5 mg by mouth daily as needed for moderate constipation.   Yes Historical Provider, MD  cholecalciferol (VITAMIN D) 1000 UNITS tablet Take 1 tablet (1,000 Units total) by mouth every morning. 05/21/15  Yes Daniel J Angiulli, PA-C  colchicine 0.6 MG tablet Take 0.3 mg by mouth 2 (two) times daily. 03/30/16  Yes Historical Provider, MD  diltiazem (CARDIZEM CD) 180  MG 24 hr capsule Take 1 capsule (180 mg total) by mouth daily. 09/19/15  Yes Arnoldo Lenis, MD  ELIQUIS 5 MG TABS tablet Take 5 mg by mouth 2 (two) times daily. 04/18/16  Yes Historical Provider, MD  furosemide (LASIX) 20 MG tablet Take 20 mg by mouth 2 (two) times daily.  07/15/15  Yes Historical Provider, MD  gabapentin (NEURONTIN) 100 MG capsule Take 100 mg by mouth 3 (three) times daily.  07/09/15  Yes Historical Provider, MD  guaiFENesin (ROBITUSSIN) 100 MG/5ML liquid Take 100 mg by mouth every 4 (four) hours.   Yes Historical Provider, MD  ipratropium-albuterol (DUONEB) 0.5-2.5 (3) MG/3ML SOLN  Inhale 3 mLs into the lungs every 6 (six) hours as needed.  10/24/15  Yes Historical Provider, MD  KLOR-CON M20 20 MEQ tablet Take 20 mEq by mouth daily.  07/18/15  Yes Historical Provider, MD  LINZESS 290 MCG CAPS capsule Take 290 mcg by mouth daily before breakfast.  01/27/16  Yes Historical Provider, MD  megestrol (MEGACE) 40 MG tablet Take 40 mg by mouth 3 (three) times daily.   Yes Historical Provider, MD  oxybutynin (DITROPAN-XL) 5 MG 24 hr tablet Take 5 mg by mouth daily.  09/15/15  Yes Historical Provider, MD  risperiDONE (RISPERDAL) 0.5 MG tablet Take 0.5 mg by mouth 2 (two) times daily.  01/27/16  Yes Historical Provider, MD  TRADJENTA 5 MG TABS tablet Take 5 mg by mouth daily.  02/26/15  Yes Historical Provider, MD  traMADol (ULTRAM) 50 MG tablet Take 50 mg by mouth daily. *Also takes every 6 hours as needed for pain   Yes Historical Provider, MD  VENTOLIN HFA 108 (90 BASE) MCG/ACT inhaler Inhale 2 puffs into the lungs every 4 (four) hours as needed for wheezing or shortness of breath.  07/05/15  Yes Historical Provider, MD    Allergies:   Allergies  Allergen Reactions  . Imitrex [Sumatriptan] Other (See Comments)    Unknown Reaction   . Mango Flavor     Nausea and vomiting  . Oysters [Shellfish Allergy]     Nausea and vomiting    Social History:  reports that she quit smoking about 11 months ago. Her smoking use included Cigarettes. She has a 7.5 pack-year smoking history. She has never used smokeless tobacco. She reports that she does not drink alcohol or use illicit drugs.  Family History: Family History  Problem Relation Age of Onset  . Arrhythmia Father     Atrial fib/Pacemaker  . Heart failure Mother   . Diabetes Mother   . Other Mother     fell and broke hip  . Diabetes Sister   . Lupus Daughter   . Mental illness Son   . ADD / ADHD Son   . Other Son     soft bones  . Diabetes Maternal Grandmother   . Stroke Paternal Grandmother   . Arthritis Paternal  Grandfather   . Other Sister     MVA  . Hypertension Brother   . Diabetes Sister   . Hypertension Sister   . Colon cancer Neg Hx     Physical Exam: Filed Vitals:   04/20/16 1830 04/20/16 1845 04/20/16 1912 04/20/16 2115  BP:   136/73   Pulse: 91 50 92   Temp:    98 F (36.7 C)  TempSrc:      Resp: '22 12 15   '$ SpO2: 97% 96% 98%    General appearance: alert, cooperative and no distress Head:  Normocephalic, without obvious abnormality, atraumatic Eyes: negative Nose: Nares normal. Septum midline. Mucosa normal. No drainage or sinus tenderness. Neck: no JVD and supple, symmetrical, trachea midline Lungs: clear to auscultation bilaterally Heart: regular rate and rhythm, S1, S2 normal, no murmur, click, rub or gallop Abdomen: soft, non-tender; bowel sounds normal; no masses,  no organomegaly Extremities: extremities normal, atraumatic, no cyanosis or edema Pulses: 2+ and symmetric Skin: Skin color, texture, turgor normal. No rashes or lesions Neurologic: Mental status: Alert, oriented, thought content appropriate Cranial nerves: normal   Expressive aphasia, left sided weakness 3/5 no facial drooping    Labs on Admission:   Recent Labs  04/20/16 1909  NA 139  K 3.6  CL 104  CO2 27  GLUCOSE 85  BUN 16  CREATININE 1.01*  CALCIUM 9.4    Recent Labs  04/20/16 1909  AST 17  ALT 20  ALKPHOS 50  BILITOT 1.3*  PROT 7.3  ALBUMIN 3.6    Recent Labs  04/20/16 1909  WBC 4.0  NEUTROABS 2.1  HGB 14.9  HCT 43.7  MCV 99.3  PLT 177   Radiological Exams on Admission: Ct Head Wo Contrast  04/20/2016  CLINICAL DATA:  Slurred speech for 2 days, initial encounter EXAM: CT HEAD WITHOUT CONTRAST TECHNIQUE: Contiguous axial images were obtained from the base of the skull through the vertex without intravenous contrast. COMPARISON:  12/30/2015 FINDINGS: Bony calvarium is intact. Mild atrophic changes and chronic white matter ischemic changes are again seen and stable. No  findings to suggest acute hemorrhage, acute infarction or space-occupying mass lesion are noted. IMPRESSION: Atrophic and ischemic changes.  No acute abnormality is noted. Electronically Signed   By: Inez Catalina M.D.   On: 04/20/2016 18:48   Dg Chest Portable 1 View  04/20/2016  CLINICAL DATA:  Slurred speech.     LEFT-sided weakness. EXAM: PORTABLE CHEST 1 VIEW COMPARISON:  01/01/2016. FINDINGS: Low lung volumes. Cardiomegaly. No active infiltrates or failure. No pneumothorax. Bones unremarkable. IMPRESSION: Cardiomegaly.  Low lung volumes.  No active infiltrates or failure. Electronically Signed   By: Staci Righter M.D.   On: 04/20/2016 19:00   Old chart reviewed ekg reviewed afib no acute issues normal rate cxr reviwed no edema or infiltrates  Assessment/Plan  75 yo female h/o CVA on eloquis with expressive aphasia  Principal Problem:   Slurred speech/expressive aphasia -r/o cva.  Mri brain, carotid dopplers and cardiac echo ordered.  Add aspirin.  If indeed has had new stroke will need to change her long term anticoagulation from eloquis.  freq neuro checks.  Active Problems:   DM (diabetes mellitus), type 2 with complications ([place])- hold orals, ssi   Essential hypertension, benign- noted   Atrial fibrillation ([place])- rate controlled, on eloquis   Hemiparesis affecting left side as late effect of cerebrovascular accident ([place])  obs on tele.  Full code. Pt seen before midnight Tasheena Wambolt A 04/20/2016, 11:00 PM

## 2016-04-21 ENCOUNTER — Observation Stay (HOSPITAL_COMMUNITY): Payer: Commercial Managed Care - HMO

## 2016-04-21 ENCOUNTER — Observation Stay (HOSPITAL_BASED_OUTPATIENT_CLINIC_OR_DEPARTMENT_OTHER): Payer: Commercial Managed Care - HMO

## 2016-04-21 DIAGNOSIS — I4891 Unspecified atrial fibrillation: Secondary | ICD-10-CM

## 2016-04-21 DIAGNOSIS — R4781 Slurred speech: Secondary | ICD-10-CM | POA: Diagnosis not present

## 2016-04-21 DIAGNOSIS — I639 Cerebral infarction, unspecified: Secondary | ICD-10-CM | POA: Diagnosis not present

## 2016-04-21 DIAGNOSIS — R471 Dysarthria and anarthria: Secondary | ICD-10-CM | POA: Diagnosis not present

## 2016-04-21 DIAGNOSIS — I10 Essential (primary) hypertension: Secondary | ICD-10-CM | POA: Diagnosis not present

## 2016-04-21 DIAGNOSIS — I6523 Occlusion and stenosis of bilateral carotid arteries: Secondary | ICD-10-CM | POA: Diagnosis not present

## 2016-04-21 LAB — MRSA PCR SCREENING: MRSA by PCR: NEGATIVE

## 2016-04-21 LAB — GLUCOSE, CAPILLARY
Glucose-Capillary: 114 mg/dL — ABNORMAL HIGH (ref 65–99)
Glucose-Capillary: 80 mg/dL (ref 65–99)
Glucose-Capillary: 103 mg/dL — ABNORMAL HIGH (ref 65–99)

## 2016-04-21 LAB — LIPID PANEL
Cholesterol: 144 mg/dL (ref 0–200)
HDL: 39 mg/dL — ABNORMAL LOW (ref >40)
LDL Cholesterol: 97 mg/dL (ref 0–99)
Total CHOL/HDL Ratio: 3.7 RATIO
Triglycerides: 40 mg/dL (ref <150)
VLDL: 8 mg/dL (ref 0–40)

## 2016-04-21 LAB — ECHOCARDIOGRAM COMPLETE
Height: 60 in
Weight: 4405.67 oz

## 2016-04-21 NOTE — Evaluation (Addendum)
Physical Therapy Evaluation Patient Details Name: [name] MRN: [id number] DOB: 05-14-1941 Today's Date: 04/21/2016   History of Present Illness  75 yo female h/o CVA with residual left sided weakness, dm, htn, obesity lives at [facility] sent in for speech problem, slurred since Sunday (over 24 hours). Pt says she has a hard time saying what she wants to say, and it is better than yesterday. No fevers. No new medications. She Reports she is basicaally mostly wheelchair bound and is getting physical therapy at [facility]. Denies any recent illnesses. On eloquis. Pt referred for admission for concerns of possible new stroke. She denies any different numbness, tingling or weakness anywhere other than what she normally has on her left side.  PMH includes DM, chronic knee pain, chronic back pain, HTN, vertigo, TIA, anemia, HOH, obesity, dysrhythmia, L knee arthroscopes.   Clinical Impression  Pt received in bed, grandson present, and pt is agreeable to PT evaluation. Pt has been at [facility], and receiving PT there.  Per SW, pt was not receiving PT.  She states that she receives assistance for dressing and bathing.  She also reports 2 falls - where she was being assisted onto the [facility] with staff in the shower room, and another when she slid off the EOB.  Pt states that she is transferred via mechanical lift from her bed<>w/c, and then does her PT sitting in the chair - however this is most likely restorative therapy.  She is not able to propel herself in the w/c.  Today during evaluation, she required Max A/Total A for bed level mobility including supine<>sit.  She was able to sit on the EOB for 2-3 min, and briefly able to hold her balance with CGA, but for the majority of the time, she requires Max A.  Recommend that pt d/c back to [facility] for continued long term care.    Follow Up Recommendations SNF for long term care.     Equipment Recommendations  None recommended by PT    Recommendations for  Other Services       Precautions / Restrictions Precautions Precautions: Fall Precaution Comments: Pt states she has had a fall when attempting to use the [facility] in the shower room at [facility], as well as fall OOB.   Restrictions Weight Bearing Restrictions: No      Mobility  Bed Mobility Overal bed mobility: Needs Assistance Bed Mobility: Supine to Sit;Sit to Supine     Supine to sit: Max assist;Total assist;HOB elevated Sit to supine: Total assist   General bed mobility comments: HOB completely elevated, able to initiate movement of LE's off EOB, however needs constant vc's and tc's to continue transfer.  Bed pad used to assist pt's hips to the EOB, and requires total assistance to raise trunk up off the EOB.  Upon return to supine, pt requires Total Assistance to get back in the bed, and total Assist for supine scoot with bed in trendelenburg for positioning.   Transfers                    Ambulation/Gait                Stairs            Wheelchair Mobility    Modified Rankin (Stroke Patients Only)       Balance Overall balance assessment: Needs assistance;History of Falls Sitting-balance support: Feet unsupported;Single extremity supported Sitting balance-Leahy Scale: Poor Sitting balance - Comments: Pt required Max A for  sitting balance.  Pt not able to reach the floor with feet, and used the foot board with R UE to assist with holding balance.  Improved for ~30sec to CGA when she was able to shift weight anteriorly, and have R UE placed behind her to assist prop herself up.   Postural control: Posterior lean                                   Pertinent Vitals/Pain Pain Assessment: No/denies pain    Home Living Family/patient expects to be discharged to:: Skilled nursing facility                 Additional Comments: Pt from [facility], receiving rehab services    Prior Function Level of Independence: Needs assistance   Gait /  Transfers Assistance Needed: Pt is currently non-ambulatory, and states that they use a mechanical lift to transfer her from the bed<>w/c.  She is not able to propel the w/c.   ADL's / Homemaking Assistance Needed: total care at [facility] Hand: Right    Extremity/Trunk Assessment     RUE Deficits / Details: strength 3-/5, weak grip,      LUE Deficits / Details: Hx of hemiplegia from previous CVA - demonstrates 2/5, unable to open hand, and has trace grip strength   Lower Extremity Assessment: Generalized weakness;RLE deficits/detail;LLE deficits/detail RLE Deficits / Details: ankle DF/PF: 3-/5, knee flexion 2/5, knee extension: 2/5, hip flexion: 1/5 - full strength assessment limited due to body habitus.  LLE Deficits / Details: ankle DF/PF: 3-/5, knee flexion 2/5, knee extension: 2/5, hip flexion: 1/5 - full strength assessment limited due to body habitus.      Communication   Communication: Expressive difficulties (slurred speech)  Cognition Arousal/Alertness: Awake/alert Behavior During Therapy: WFL for tasks assessed/performed Overall Cognitive Status: Within Functional Limits for tasks assessed                      General Comments      Exercises        Assessment/Plan    PT Assessment Patent does not need any further PT services  PT Diagnosis Generalized weakness   PT Problem List    PT Treatment Interventions     PT Goals (Current goals can be found in the Care Plan section) Acute Rehab PT Goals Patient Stated Goal: none stated    Frequency     Barriers to discharge        Co-evaluation               End of Session   Activity Tolerance: Patient limited by fatigue Patient left: in bed;with bed alarm set;with family/visitor present Nurse Communication: Mobility status;Need for lift equipment (Pt will require hoyer/total lift for OOB mobility - this is her baseline. )    Functional Assessment Tool Used:  [place] "6-clicks" Functional Limitation: Changing and maintaining body position Changing and Maintaining Body Position Current Status [phone]): At least 80 percent but less than 100 percent impaired, limited or restricted Changing and Maintaining Body Position Goal Status (S0630): At least 80 percent but less than 100 percent impaired, limited or restricted Changing and Maintaining Body Position Discharge Status [phone]): At least 80 percent but less than 100 percent impaired, limited or restricted    Time: 1020-1047 PT Time Calculation (min) (ACUTE ONLY):  27 min   Charges:   PT Evaluation $PT Eval Moderate Complexity: 1 Procedure PT Treatments $Therapeutic Activity: 8-22 mins   PT G Codes:   PT G-Codes **NOT FOR INPATIENT CLASS** Functional Assessment Tool Used: [employer] "6-clicks" Functional Limitation: Changing and maintaining body position Changing and Maintaining Body Position Current Status [phone]): At least 80 percent but less than 100 percent impaired, limited or restricted Changing and Maintaining Body Position Goal Status (U9811): At least 80 percent but less than 100 percent impaired, limited or restricted Changing and Maintaining Body Position Discharge Status [phone]): At least 80 percent but less than 100 percent impaired, limited or restricted    Tacy Learn, PT, DPT X: [id number]   04/21/2016, 1:51 PM

## 2016-04-21 NOTE — Progress Notes (Signed)
*  PRELIMINARY RESULTS* Echocardiogram 2D Echocardiogram has been performed.  [name] 04/21/2016, 4:07 PM

## 2016-04-21 NOTE — Clinical Social Work Note (Signed)
Clinical Social Work Assessment  Patient Details  Name: [name] MRN: [id number] Date of Birth: [birth year]  Date of referral:  04/21/16               Reason for consult:  Discharge Planning                Permission sought to share information with:  Facility Art therapist granted to share information::  Yes, Verbal Permission Granted  Name::        Agency::  [facility]  Relationship::     Contact Information:     Housing/Transportation Living arrangements for the past 2 months:  [place] of Information:  Patient, Facility Patient Interpreter Needed:  None Criminal Activity/Legal Involvement Pertinent to Current Situation/Hospitalization:  No - Comment as needed Significant Relationships:  Adult Children, Other Family Members Lives with:  Facility Resident Do you feel safe going back to the place where you live?  Yes Need for family participation in patient care:  Yes (Comment)  Care giving concerns:  Pt is long term at SNF.    Social Worker assessment / plan:  CSW met with pt and pt's grandson, Lanny Hurst at bedside. Pt reports she has had two strokes in the past and is worried that she has had another stroke. She came to ED due to slurred speech. Pt has been at [facility] for the past year since her last stroke. Pt's family is very involved and supportive. Pt indicates she remains hopeful to return home, but her grandson indicates that placement will be long term. Per [name] at facility, pt is nursing level of care and okay to return. Pt requires lift for transfers.    Employment status:  Retired Nurse, adult PT Recommendations:  Not assessed at this time Information / Referral to community resources:  Other (Comment Required) (return to [facility])  Patient/Family's Response to care:  Pt requests to return to [facility] when medically stable.   Patient/Family's Understanding of and Emotional Response to Diagnosis,  Current Treatment, and Prognosis:  Pt is aware of admission diagnosis and treatment plan.   Emotional Assessment Appearance:  Appears stated age Attitude/Demeanor/Rapport:  Other (Pleasant) Affect (typically observed):  Appropriate Orientation:  Oriented to Self, Oriented to Place, Oriented to  Time, Oriented to Situation Alcohol / Substance use:  Not Applicable Psych involvement (Current and /or in the community):  No (Comment)  Discharge Needs  Concerns to be addressed:  Discharge Planning Concerns Readmission within the last 30 days:  No Current discharge risk:  None Barriers to Discharge:  Continued Medical Work up   [employer], [employer], [place] 04/21/2016, 10:36 AM [phone]

## 2016-04-21 NOTE — Evaluation (Signed)
Occupational Therapy Evaluation Patient Details Name: [name] MRN: [id number] DOB: Jan 31, 1941 Today's Date: 04/21/2016    History of Present Illness 75 yo female h/o CVA with residual left sided weakness, dm, htn, obesity lives at [facility] sent in for speech problem, slurred since Sunday (over 24 hours). Pt says she has a hard time saying what she wants to say, and it is better than yesterday. No fevers. No new medications. She Reports she is basicaally mostly wheelchair bound and is getting physical therapy at [facility]. Denies any recent illnesses. On eloquis. Pt referred for admission for concerns of possible new stroke. She denies any different numbness, tingling or weakness anywhere other than what she normally has on her left side.   Clinical Impression   Pt awake, alert, oriented to person and place. Pt with hx of LUE weakness due to previous CVA, pt with RUE strength 3-/5 today, reports she feels like her strength is normal. Pt is max-total care at baseline reporting use of hoyer lift for all mobility purposes at baseline, total assist with bathing and dressing tasks, set-up to max assist for grooming. Pt also reporting that she does not perform toileting tasks, refuses to use call bell while at [facility], stating that "I just do what I need to in the bed and they clean me up." Recommend return to SNF with resumption of rehab services as pt is at baseline with ADL functioning, will continue to benefit from SNF rehab services to increase participation and independence in ADL and mobility tasks.      Follow Up Recommendations  SNF (return to [facility] with resumption of rehab services)    Equipment Recommendations  None recommended by OT       Precautions / Restrictions Precautions Precautions: Fall Restrictions Weight Bearing Restrictions: No      Mobility Bed Mobility               General bed mobility comments: not tested  Transfers                  General transfer comment: not tested         ADL Overall ADL's : Needs assistance/impaired Eating/Feeding: Set up;Cueing for safety (cuing for appropriate sized bites)                                     General ADL Comments: Pt is total care for all ADL tasks. Pt reports she does not call for assistance for toileting, she just does what she needs to in the bed and they clean her up. Pt is max-total care for all bathing and dressing tasks, suspect assistance is required for grooming tasks as well. Pt reports a lift is used for all mobility purposes. Pt also reports she has been bed bound since Sunday (5/14) due to her leg getting caught under a wheelchair.      Vision Vision Assessment?: No apparent visual deficits          Pertinent Vitals/Pain Pain Assessment: No/denies pain     Hand Dominance Right   Extremity/Trunk Assessment Upper Extremity Assessment Upper Extremity Assessment: LUE deficits/detail;RUE deficits/detail RUE Deficits / Details: strength 3-/5 LUE Deficits / Details: Hx of hemiplegia from previous CVA   Lower Extremity Assessment Lower Extremity Assessment: Defer to PT evaluation       Communication Communication Communication: Expressive difficulties (slurred speech)   Cognition Arousal/Alertness: Awake/alert Behavior During Therapy:  WFL for tasks assessed/performed Overall Cognitive Status: No family/caregiver present to determine baseline cognitive functioning                                Home Living Family/patient expects to be discharged to:: Skilled nursing facility                                 Additional Comments: Pt from [facility], receiving rehab services      Prior Functioning/Environment Level of Independence: Needs assistance  Gait / Transfers Assistance Needed: Pt reports she does not stand, lift is used for all mobility purposes ADL's / Homemaking Assistance Needed: Per pt, max-total care  is required at [facility]        OT Diagnosis: Generalized weakness   OT Problem List: Decreased strength;Impaired balance (sitting and/or standing);Decreased activity tolerance;Decreased knowledge of use of DME or AE    End of Session    Activity Tolerance: Patient limited by lethargy Patient left: in bed;with call bell/phone within reach;with bed alarm set   Time: [phone] OT Time Calculation (min): 25 min Charges:  OT General Charges $OT Visit: 1 Procedure OT Evaluation $OT Eval Low Complexity: 1 Procedure G-Codes: OT G-codes **NOT FOR INPATIENT CLASS** Functional Assessment Tool Used: clinical judgement Functional Limitation: Self care Self Care Current Status (M0867): At least 80 percent but less than 100 percent impaired, limited or restricted Self Care Goal Status (Y1950): At least 80 percent but less than 100 percent impaired, limited or restricted Self Care Discharge Status [phone]): At least 80 percent but less than 100 percent impaired, limited or restricted  Guadelupe Sabin, OTR/L  [phone]  04/21/2016, 9:52 AM

## 2016-04-21 NOTE — Progress Notes (Signed)
Patient was ordered MRI. Spoken with personell in Radiology and was told, they are not able to do it because of patient's body composition. Notified mid-level Tylene Fantasia.

## 2016-04-21 NOTE — Care Management Note (Signed)
Case Management Note  Patient Details  Name: [name] MRN: [id number] Date of Birth: 1941/03/12  Subjective/Objective:                  Pt is from [facility]. CSW is aware and will make arrangements for return to facility when appropriate.   Action/Plan: No CM needs anticipated.   Expected Discharge Date:      04/22/2016            Expected Discharge Plan:  Skilled Nursing Facility  In-House Referral:  Clinical Social Work  Discharge planning Services  CM Consult  Post Acute Care Choice:  NA Choice offered to:  NA  DME Arranged:    DME Agency:     HH Arranged:    [place] Agency:     Status of Service:  Completed, signed off  Medicare Important Message Given:    Date Medicare IM Given:    Medicare IM give by:    Date Additional Medicare IM Given:    Additional Medicare Important Message give by:     If discussed at [place] of Stay Meetings, dates discussed:    Additional Comments:  Sherald Barge, RN 04/21/2016, 2:55 PM

## 2016-04-21 NOTE — Care Management Obs Status (Signed)
[place] NOTIFICATION   Patient Details  Name: [name] MRN: [id number] Date of Birth: 01/22/1941   Medicare Observation Status Notification Given:  Yes    Sherald Barge, RN 04/21/2016, 2:54 PM

## 2016-04-21 NOTE — Progress Notes (Signed)
Subjective: Patient was admitted yesterday due to slurred speech. She has history of CVA in the past.and chronic atrial fibrillation on anticoagulation. Her CT scan showed no acute finding.  Objective: Vital signs in last 24 hours: Temp:  [97.8 F (36.6 C)-98.3 F (36.8 C)] 97.8 F (36.6 C) (05/17 0745) Pulse Rate:  [31-106] 63 (05/17 0745) Resp:  [12-29] 16 (05/17 0745) BP: (108-148)/(60-98) 136/96 mmHg (05/17 0745) SpO2:  [93 %-100 %] 100 % (05/17 0745) Weight:  [124.9 kg (275 lb 5.7 oz)] 124.9 kg (275 lb 5.7 oz) (05/16 2345) Weight change:  Last BM Date: 04/20/16  Intake/Output from previous day:    PHYSICAL EXAM General appearance: no distress and slowed mentation Resp: clear to auscultation bilaterally Cardio: irregularly irregular rhythm GI: soft, non-tender; bowel sounds normal; no masses,  no organomegaly Extremities: extremities normal, atraumatic, no cyanosis or edema  Lab Results:  Results for orders placed or performed during the hospital encounter of 04/20/16 (from the past 48 hour(s))  CBC with Differential/Platelet     Status: None   Collection Time: 04/20/16  7:09 PM  Result Value Ref Range   WBC 4.0 4.0 - 10.5 K/uL   RBC 4.40 3.87 - 5.11 MIL/uL   Hemoglobin 14.9 12.0 - 15.0 g/dL   HCT 43.7 36.0 - 46.0 %   MCV 99.3 78.0 - 100.0 fL   MCH 33.9 26.0 - 34.0 pg   MCHC 34.1 30.0 - 36.0 g/dL   RDW 13.0 11.5 - 15.5 %   Platelets 177 150 - 400 K/uL   Neutrophils Relative % 51 %   Neutro Abs 2.1 1.7 - 7.7 K/uL   Lymphocytes Relative 38 %   Lymphs Abs 1.5 0.7 - 4.0 K/uL   Monocytes Relative 10 %   Monocytes Absolute 0.4 0.1 - 1.0 K/uL   Eosinophils Relative 1 %   Eosinophils Absolute 0.0 0.0 - 0.7 K/uL   Basophils Relative 0 %   Basophils Absolute 0.0 0.0 - 0.1 K/uL  Comprehensive metabolic panel     Status: Abnormal   Collection Time: 04/20/16  7:09 PM  Result Value Ref Range   Sodium 139 135 - 145 mmol/L   Potassium 3.6 3.5 - 5.1 mmol/L   Chloride 104  101 - 111 mmol/L   CO2 27 22 - 32 mmol/L   Glucose, Bld 85 65 - 99 mg/dL   BUN 16 6 - 20 mg/dL   Creatinine, Ser 1.01 (H) 0.44 - 1.00 mg/dL   Calcium 9.4 8.9 - 10.3 mg/dL   Total Protein 7.3 6.5 - 8.1 g/dL   Albumin 3.6 3.5 - 5.0 g/dL   AST 17 15 - 41 U/L   ALT 20 14 - 54 U/L   Alkaline Phosphatase 50 38 - 126 U/L   Total Bilirubin 1.3 (H) 0.3 - 1.2 mg/dL   GFR calc non Af Amer 54 (L) >60 mL/min   GFR calc Af Amer >60 >60 mL/min    Comment: (NOTE) The eGFR has been calculated using the CKD EPI equation. This calculation has not been validated in all clinical situations. eGFR's persistently <60 mL/min signify possible Chronic Kidney Disease.    Anion gap 8 5 - 15  MRSA PCR Screening     Status: None   Collection Time: 04/21/16 12:04 AM  Result Value Ref Range   MRSA by PCR NEGATIVE NEGATIVE    Comment:        The GeneXpert MRSA Assay (FDA approved for NASAL specimens only), is one component of  a comprehensive MRSA colonization surveillance program. It is not intended to diagnose MRSA infection nor to guide or monitor treatment for MRSA infections.   Lipid panel     Status: Abnormal   Collection Time: 04/21/16  4:56 AM  Result Value Ref Range   Cholesterol 144 0 - 200 mg/dL   Triglycerides 40 <150 mg/dL   HDL 39 (L) >40 mg/dL   Total CHOL/HDL Ratio 3.7 RATIO   VLDL 8 0 - 40 mg/dL   LDL Cholesterol 97 0 - 99 mg/dL    Comment:        Total Cholesterol/HDL:CHD Risk Coronary Heart Disease Risk Table                     Men   Women  1/2 Average Risk   3.4   3.3  Average Risk       5.0   4.4  2 X Average Risk   9.6   7.1  3 X Average Risk  23.4   11.0        Use the calculated Patient Ratio above and the CHD Risk Table to determine the patient's CHD Risk.        ATP III CLASSIFICATION (LDL):  <100     mg/dL   Optimal  100-129  mg/dL   Near or Above                    Optimal  130-159  mg/dL   Borderline  160-189  mg/dL   High  >190     mg/dL   Very High      ABGS No results for input(s): PHART, PO2ART, TCO2, HCO3 in the last 72 hours.  Invalid input(s): PCO2 CULTURES Recent Results (from the past 240 hour(s))  MRSA PCR Screening     Status: None   Collection Time: 04/21/16 12:04 AM  Result Value Ref Range Status   MRSA by PCR NEGATIVE NEGATIVE Final    Comment:        The GeneXpert MRSA Assay (FDA approved for NASAL specimens only), is one component of a comprehensive MRSA colonization surveillance program. It is not intended to diagnose MRSA infection nor to guide or monitor treatment for MRSA infections.    Studies/Results: Ct Head Wo Contrast  04/20/2016  CLINICAL DATA:  Slurred speech for 2 days, initial encounter EXAM: CT HEAD WITHOUT CONTRAST TECHNIQUE: Contiguous axial images were obtained from the base of the skull through the vertex without intravenous contrast. COMPARISON:  12/30/2015 FINDINGS: Bony calvarium is intact. Mild atrophic changes and chronic white matter ischemic changes are again seen and stable. No findings to suggest acute hemorrhage, acute infarction or space-occupying mass lesion are noted. IMPRESSION: Atrophic and ischemic changes.  No acute abnormality is noted. Electronically Signed   By: Inez Catalina M.D.   On: 04/20/2016 18:48   Dg Chest Portable 1 View  04/20/2016  CLINICAL DATA:  Slurred speech.     LEFT-sided weakness. EXAM: PORTABLE CHEST 1 VIEW COMPARISON:  01/01/2016. FINDINGS: Low lung volumes. Cardiomegaly. No active infiltrates or failure. No pneumothorax. Bones unremarkable. IMPRESSION: Cardiomegaly.  Low lung volumes.  No active infiltrates or failure. Electronically Signed   By: Staci Righter M.D.   On: 04/20/2016 19:00    Medications: I have reviewed the patient's current medications.  Assesment:   Principal Problem:   Slurred speech Active Problems:   DM (diabetes mellitus), type 2 with complications (HCC)   Essential hypertension, benign  Atrial fibrillation (HCC)   Paroxysmal  atrial fibrillation (HCC)   Hemiparesis affecting left side as late effect of cerebrovascular accident [facility])    Plan:  Medications reviewed Will continue current treatment Will do neurology consult Will do HGA1C      Paulmichael Schreck 04/21/2016, 8:03 AM

## 2016-04-21 NOTE — Consult Note (Signed)
[place] A. Merlene Laughter, MD     [email or web address]          [name] is an 75 y.o. female.   ASSESSMENT/PLAN: 1. Acute dysarthria most likely due to subcortical infarct. Risk factors chronic atrial fibrillation, diabetes, hypertension, previous infarct, dyslipidemia and obstructive sleep apnea syndrome.  2. Undiagnosed and untreated obstructive sleep apnea syndrome.  RECOMMENDATION: Repeat MRI of the brain. The patient indeed has had a daily event on imaging, then I think we may consider continuing the addition of aspirin to her current anticoagulation for one month. Afterwards, the patient's aspirin should be discontinued and the anticoagulation continued to reduce risk of hemorrhage.  Outpatient diagnostic polysomnography.  Physical and occupational therapies.  The patient is 75 year old black female who presents with a two-day history of significant dysarthria and dysphagia. The patient has had 2 strokes in the past. One was in 2016 June of that year and also in 2015. The history obtained from the door and the patient. The daughter reports that the patient has had significant impairment since her second stroke which occurred in June 2016. She has been in a nursing home facility receiving physical therapy. She was left with marked left-sided weakness. She apparently stays in a wheelchair or bed most times. She does get some range of motion actively but has not ambulated in a long time. She does have chronic atrial fibrillation and is on anticoagulation. The patient reports that her left-sided weakness has not gotten worse with her most current event. The dysarthria and dysphagia have improved her today but he was quite severe per the family. The patient does not report other symptoms at this time such as headache, chest pain or shortness of breath. There again is no worsening weakness of left side. No right-sided weakness. The review of systems otherwise  negative.  GENERAL: This is a very pleasant morbidly obese female in no acute distress.  HEENT: Supple. Atraumatic normocephalic. She has a large neck and a large tongue and crowded posterior air space.  ABDOMEN: soft  EXTREMITIES: No edema   BACK: Normal.  SKIN: Normal by inspection.    MENTAL STATUS: Alert and oriented (she states her age and the month appropriately). Speech is mildly dysarthric; language and cognition are generally intact. Judgment and insight normal.   CRANIAL NERVES: Pupils are equal, round and reactive to light and accommodation; extra ocular movements are full, there is no significant nystagmus; visual fields are full; upper and lower facial muscles are normal in strength and symmetric, there is no flattening of the nasolabial folds; tongue is midline; uvula is midline; shoulder elevation is normal.  MOTOR: She has antigravity strength and at least 4/5 in the upper extremities. Lower extremities 3/5. There is mild pronator drift left upper extremity. Mild drift left leg. No drift on the right side.  COORDINATION: Left finger to nose is normal, right finger to nose is normal, No rest tremor; no intention tremor; no postural tremor; no bradykinesia.  REFLEXES: Deep tendon reflexes are symmetrical and normal.   SENSATION: Normal to light touch. No extinction to double simultaneous stimulation.   NIS stroke scale 1, 1, 1, total 3.   Blood pressure 124/68, pulse 85, temperature 98.2 F (36.8 C), temperature source Oral, resp. rate 16, height 5' (1.524 m), weight 275 lb 5.7 oz (124.9 kg), last menstrual period 03/31/2013, SpO2 96 %.  Past Medical History  Diagnosis Date  . Diabetes mellitus   . Asthma   . Chronic knee pain   .  Back pain, chronic   . Hypertension   . Vertigo   . Hyperlipidemia   . TIA (transient ischemic attack)   . Anemia   . HOH (hard of hearing)   . Arthritis   . Obesity   . PMB (postmenopausal bleeding) 11/26/2014  . Urge  incontinence 11/26/2014  . Thickened endometrium 01/15/2015  . Dysrhythmia   . Stroke [facility])     Mini Stroke    Past Surgical History  Procedure Laterality Date  . Appendectomy    . Ovary surgery    . Cataract extraction w/phaco Left 01/16/2013    Procedure: CATARACT EXTRACTION PHACO AND INTRAOCULAR LENS PLACEMENT (IOC);  Surgeon: Elta Guadeloupe T. Gershon Crane, MD;  Location: [facility];  Service: Ophthalmology;  Laterality: Left;  CDE:14.37  . Endometrial biopsy  04/03/2013       . Hysteroscopy w/d&c N/A 04/24/2013    Procedure: SUCTION DILATATION AND CURETTAGE /HYSTEROSCOPY;  Surgeon: Jonnie Kind, MD;  Location: [facility];  Service: Gynecology;  Laterality: N/A;  . Knee arthroscopy with medial menisectomy Left 07/26/2013    Procedure: KNEE ARTHROSCOPY WITH PARTIAL MEDIAL MENISECTOMY;  Surgeon: Sanjuana Kava, MD;  Location: [facility];  Service: Orthopedics;  Laterality: Left;  . Endometrial ablation    . Colonoscopy  2009    Dr. Wilford Corner: 4 hyperplastic polyps removed. Small internal hemorrhoids. Recommended 5 year follow-up colonoscopy.  . Colonoscopy N/A 02/24/2015    Procedure: COLONOSCOPY;  Surgeon: Danie Binder, MD;  Location: [facility] SU[facility];  Service: Endoscopy;  Laterality: N/A;  1030  . Hysteroscopy w/d&c N/A 04/02/2015    Procedure: UTERINE CURETTAGE, HYSTEROSCOPY;  Surgeon: Florian Buff, MD;  Location: [facility];  Service: Gynecology;  Laterality: N/A;    Family History  Problem Relation Age of Onset  . Arrhythmia Father     Atrial fib/Pacemaker  . Heart failure Mother   . Diabetes Mother   . Other Mother     fell and broke hip  . Diabetes Sister   . Lupus Daughter   . Mental illness Son   . ADD / ADHD Son   . Other Son     soft bones  . Diabetes Maternal Grandmother   . Stroke Paternal Grandmother   . Arthritis Paternal Grandfather   . Other Sister     MVA  . Hypertension Brother   . Diabetes Sister   . Hypertension Sister   . Colon cancer Neg Hx     Social History:   reports that she quit smoking about 11 months ago. Her smoking use included Cigarettes. She has a 7.5 pack-year smoking history. She has never used smokeless tobacco. She reports that she does not drink alcohol or use illicit drugs.  Allergies:  Allergies  Allergen Reactions  . Imitrex [Sumatriptan] Other (See Comments)    Unknown Reaction   . Mango Flavor     Nausea and vomiting  . Oysters [Shellfish Allergy]     Nausea and vomiting    Medications: Prior to Admission medications   Medication Sig Start Date End Date Taking? Authorizing Provider  acetaminophen (TYLENOL) 325 MG tablet Take 650 mg by mouth every 6 (six) hours as needed for mild pain.    Yes Historical Provider, MD  atorvastatin (LIPITOR) 20 MG tablet Take 80 mg by mouth at bedtime.    Yes Historical Provider, MD  bisacodyl (DULCOLAX) 5 MG EC tablet Take 5 mg by mouth daily as needed for moderate constipation.   Yes Historical Provider, MD  cholecalciferol (VITAMIN D) 1000 UNITS tablet Take 1 tablet (1,000 Units total) by mouth every morning. 05/21/15  Yes Daniel J Angiulli, PA-C  colchicine 0.6 MG tablet Take 0.3 mg by mouth 2 (two) times daily. 03/30/16  Yes Historical Provider, MD  diltiazem (CARDIZEM CD) 180 MG 24 hr capsule Take 1 capsule (180 mg total) by mouth daily. 09/19/15  Yes Arnoldo Lenis, MD  ELIQUIS 5 MG TABS tablet Take 5 mg by mouth 2 (two) times daily. 04/18/16  Yes Historical Provider, MD  furosemide (LASIX) 20 MG tablet Take 20 mg by mouth 2 (two) times daily.  07/15/15  Yes Historical Provider, MD  gabapentin (NEURONTIN) 100 MG capsule Take 100 mg by mouth 3 (three) times daily.  07/09/15  Yes Historical Provider, MD  guaiFENesin (ROBITUSSIN) 100 MG/5ML liquid Take 100 mg by mouth every 4 (four) hours.   Yes Historical Provider, MD  ipratropium-albuterol (DUONEB) 0.5-2.5 (3) MG/3ML SOLN Inhale 3 mLs into the lungs every 6 (six) hours as needed.  10/24/15  Yes Historical Provider, MD  KLOR-CON M20 20 MEQ  tablet Take 20 mEq by mouth daily.  07/18/15  Yes Historical Provider, MD  LINZESS 290 MCG CAPS capsule Take 290 mcg by mouth daily before breakfast.  01/27/16  Yes Historical Provider, MD  megestrol (MEGACE) 40 MG tablet Take 40 mg by mouth 3 (three) times daily.   Yes Historical Provider, MD  oxybutynin (DITROPAN-XL) 5 MG 24 hr tablet Take 5 mg by mouth daily.  09/15/15  Yes Historical Provider, MD  risperiDONE (RISPERDAL) 0.5 MG tablet Take 0.5 mg by mouth 2 (two) times daily.  01/27/16  Yes Historical Provider, MD  TRADJENTA 5 MG TABS tablet Take 5 mg by mouth daily.  02/26/15  Yes Historical Provider, MD  traMADol (ULTRAM) 50 MG tablet Take 50 mg by mouth daily. *Also takes every 6 hours as needed for pain   Yes Historical Provider, MD  VENTOLIN HFA 108 (90 BASE) MCG/ACT inhaler Inhale 2 puffs into the lungs every 4 (four) hours as needed for wheezing or shortness of breath.  07/05/15  Yes Historical Provider, MD    Scheduled Meds: .  stroke: mapping our early stages of recovery book   Does not apply Once  . apixaban  5 mg Oral BID  . aspirin  300 mg Rectal Daily   Or  . aspirin  325 mg Oral Daily  . atorvastatin  80 mg Oral QHS  . diltiazem  180 mg Oral Daily  . furosemide  20 mg Oral BID  . gabapentin  100 mg Oral TID   Continuous Infusions:  PRN Meds:.albuterol, ipratropium-albuterol     Results for orders placed or performed during the hospital encounter of 04/20/16 (from the past 48 hour(s))  CBC with Differential/Platelet     Status: None   Collection Time: 04/20/16  7:09 PM  Result Value Ref Range   WBC 4.0 4.0 - 10.5 K/uL   RBC 4.40 3.87 - 5.11 MIL/uL   Hemoglobin 14.9 12.0 - 15.0 g/dL   HCT 43.7 36.0 - 46.0 %   MCV 99.3 78.0 - 100.0 fL   MCH 33.9 26.0 - 34.0 pg   MCHC 34.1 30.0 - 36.0 g/dL   RDW 13.0 11.5 - 15.5 %   Platelets 177 150 - 400 K/uL   Neutrophils Relative % 51 %   Neutro Abs 2.1 1.7 - 7.7 K/uL   Lymphocytes Relative 38 %   Lymphs Abs 1.5 0.7 - 4.0 K/uL  Monocytes Relative 10 %   Monocytes Absolute 0.4 0.1 - 1.0 K/uL   Eosinophils Relative 1 %   Eosinophils Absolute 0.0 0.0 - 0.7 K/uL   Basophils Relative 0 %   Basophils Absolute 0.0 0.0 - 0.1 K/uL  Comprehensive metabolic panel     Status: Abnormal   Collection Time: 04/20/16  7:09 PM  Result Value Ref Range   Sodium 139 135 - 145 mmol/L   Potassium 3.6 3.5 - 5.1 mmol/L   Chloride 104 101 - 111 mmol/L   CO2 27 22 - 32 mmol/L   Glucose, Bld 85 65 - 99 mg/dL   BUN 16 6 - 20 mg/dL   Creatinine, Ser 1.01 (H) 0.44 - 1.00 mg/dL   Calcium 9.4 8.9 - 10.3 mg/dL   Total Protein 7.3 6.5 - 8.1 g/dL   Albumin 3.6 3.5 - 5.0 g/dL   AST 17 15 - 41 U/L   ALT 20 14 - 54 U/L   Alkaline Phosphatase 50 38 - 126 U/L   Total Bilirubin 1.3 (H) 0.3 - 1.2 mg/dL   GFR calc non Af Amer 54 (L) >60 mL/min   GFR calc Af Amer >60 >60 mL/min    Comment: (NOTE) The eGFR has been calculated using the CKD EPI equation. This calculation has not been validated in all clinical situations. eGFR's persistently <60 mL/min signify possible Chronic Kidney Disease.    Anion gap 8 5 - 15  MRSA PCR Screening     Status: None   Collection Time: 04/21/16 12:04 AM  Result Value Ref Range   MRSA by PCR NEGATIVE NEGATIVE    Comment:        The GeneXpert MRSA Assay (FDA approved for NASAL specimens only), is one component of a comprehensive MRSA colonization surveillance program. It is not intended to diagnose MRSA infection nor to guide or monitor treatment for MRSA infections.   Lipid panel     Status: Abnormal   Collection Time: 04/21/16  4:56 AM  Result Value Ref Range   Cholesterol 144 0 - 200 mg/dL   Triglycerides 40 <150 mg/dL   HDL 39 (L) >40 mg/dL   Total CHOL/HDL Ratio 3.7 RATIO   VLDL 8 0 - 40 mg/dL   LDL Cholesterol 97 0 - 99 mg/dL    Comment:        Total Cholesterol/HDL:CHD Risk Coronary Heart Disease Risk Table                     Men   Women  1/2 Average Risk   3.4   3.3  Average Risk        5.0   4.4  2 X Average Risk   9.6   7.1  3 X Average Risk  23.4   11.0        Use the calculated Patient Ratio above and the CHD Risk Table to determine the patient's CHD Risk.        ATP III CLASSIFICATION (LDL):  <100     mg/dL   Optimal  100-129  mg/dL   Near or Above                    Optimal  130-159  mg/dL   Borderline  160-189  mg/dL   High  >190     mg/dL   Very High   Glucose, capillary     Status: Abnormal   Collection Time: 04/21/16 12:47 PM  Result Value  Ref Range   Glucose-Capillary 114 (H) 65 - 99 mg/dL  Glucose, capillary     Status: None   Collection Time: 04/21/16  5:40 PM  Result Value Ref Range   Glucose-Capillary 80 65 - 99 mg/dL    Studies/Results:    [[[[[[[[[[[[[[[[[BRAIN MRI/MRA 6 - 2016 CLINICAL DATA: 75 year old diabetic hypertensive female with hyperlipidemia and atrial fibrillation presenting with left arm and leg weakness. Subsequent encounter.  EXAM: MRI HEAD WITHOUT CONTRAST  MRA HEAD WITHOUT CONTRAST  TECHNIQUE: Multiplanar, multiecho pulse sequences of the brain and surrounding structures were obtained without intravenous contrast. Angiographic images of the head were obtained using MRA technique without contrast.  COMPARISON: 05/10/2015 head CT. 07/27/2014 MR.  FINDINGS: MR BRAIN:  Acute nonhemorrhagic infarct right caudate. Acute nonhemorrhagic infarcts in a parasagittal distribution within the right frontal lobe and anterior aspect of the right parietal lobe.  Remote anterior right frontal lobe/genu of the corpus callosum infarct with encephalomalacia and minimal amount of blood breakdown products in the adjacent right genu of the corpus callosum. Remote small basal ganglia infarcts bilaterally.  Moderate small vessel disease type changes.  Mild atrophy without hydrocephalus.  No intracranial mass lesion noted on this unenhanced exam.  Partial opacification mastoid air cells greater on right.  No obstructing lesion of the eustachian tube noted. Paranasal sinus mucosal thickening.  Post lens replacement otherwise orbital structures unremarkable.  Expanded partially empty sella. This finding and slightly prominent peri optic spaces can be seen in this setting of pseudotumor cerebri. No flattening of the posterior aspect of the globes.  Cervical medullary junction and pineal region unremarkable.  MRA HEAD FINDINGS  Ectatic right internal carotid artery distal cervical segment.  Slight irregularity and ectasia of the cavernous segment of the internal carotid artery bilaterally.  High-grade stenosis of proximal right middle cerebral artery branches just beyond the bifurcation. Decrease number of visualized right middle cerebral artery branches consistent with patient's acute infarct.  High-grade stenosis distal A1 segment right anterior cerebral artery. Markedly narrowed and irregular A2 segment anterior cerebral artery more notable on the right.  Fetal type contribution to the left posterior cerebral artery.  Mild to moderate narrowing left middle cerebral artery branch vessels.  No significant stenosis of the distal vertebral arteries or basilar artery.  Nonvisualized posterior inferior cerebellar arteries.  Moderate narrowing proximal left anterior inferior cerebellar artery.  Ectatic proximal aspect of the left superior cerebellar artery without saccular aneurysm.  Mild prominence distal basilar artery without saccular aneurysm.  Posterior cerebral artery mild moderate distal branch vessel irregularity.  IMPRESSION: MR BRAIN:  Acute nonhemorrhagic infarct right caudate. Acute nonhemorrhagic infarcts in a parasagittal distribution within the right frontal lobe and anterior aspect of the right parietal lobe.  Remote anterior right frontal lobe/genu of the corpus callosum infarct. Remote small basal ganglia infarcts  bilaterally.  Moderate small vessel disease type changes.  Partial opacification mastoid air cells greater on right. Paranasal sinus mucosal thickening.  Expanded partially empty sella. This finding and slightly prominent peri optic spaces can be seen in this setting of pseudotumor cerebri. No flattening of the posterior aspect of the globes.  MRA HEAD FINDINGS  High-grade stenosis of proximal right middle cerebral artery branches just beyond the bifurcation. Decrease number of visualized right middle cerebral artery branches consistent with patient's acute infarct.]]]]]]]]]]]]]]]]   CAROTID DOPPLERS  Normal  HEAD CT Bony calvarium is intact. Mild atrophic changes and chronic white matter ischemic changes are again seen and stable. No findings to suggest acute hemorrhage,  acute infarction or space-occupying mass lesion are noted.  IMPRESSION: Atrophic and ischemic changes. No acute abnormality is noted.   TTE - Left ventricle: The cavity size was normal. Wall thickness was  increased in a pattern of moderate LVH. Systolic function was  normal. The estimated ejection fraction was in the range of 60%  to 65%. Wall motion was normal; there were no regional wall  motion abnormalities. The study was not technically sufficient to  allow evaluation of LV diastolic dysfunction due to atrial  fibrillation. - Aortic valve: Mildly to moderately calcified annulus. Trileaflet.  There was mild regurgitation. - Mitral valve: Mildly calcified annulus. There was mild  regurgitation.    Aurelia Gras A. Merlene Laughter, M.D.  Diplomate, Tax adviser of Psychiatry and Neurology ( Neurology). 04/21/2016, 6:22 PM

## 2016-04-21 NOTE — NC FL2 (Signed)
[place] LEVEL OF CARE SCREENING TOOL     IDENTIFICATION  Patient Name: [name] Birthdate: [birth year] Sex: female Admission Date (Current Location): 04/20/2016  [place] and [place] Number:  [place] [id number] [place] and Address:  [facility] [address] [place]      Provider Number: [id number]  Attending Physician Name and Address:  Rosita Fire, MD  Relative Name and Phone Number:       Current Level of Care: Hospital Recommended Level of Care: [place] Prior Approval Number:    Date Approved/Denied:   PASRR Number: [id number]  Discharge Plan: SNF    Current Diagnoses: Patient Active Problem List   Diagnosis Date Noted  . Stroke (cerebrum) ([place]) 04/20/2016  . Slurred speech 04/20/2016  . Right knee pain 01/21/2016  . Spastic hemiplegia affecting nondominant side ([place]) 09/19/2015  . Type 2 diabetes mellitus with other circulatory complications ([place]) 29/93/7169  . Essential hypertension 07/29/2015  . HLD (hyperlipidemia) 07/29/2015  . Complex regional pain syndrome I of upper limb 06/27/2015  . Hemiparesis affecting left side as late effect of cerebrovascular accident ([place]) 06/27/2015  . Cerebral infarction due to embolism of right anterior cerebral artery ([place]) 05/14/2015  . Embolic cerebral infarction ([place]) 05/14/2015  . Cerebral infarction due to unspecified mechanism   . Paroxysmal atrial fibrillation ([facility])   . Stroke ([place])   . Postmenopausal bleeding   . Left-sided weakness 05/10/2015  . CVA (cerebral vascular accident) ([place]) 05/10/2015  . Complex endometrial hyperplasia without atypia 02/11/2015  . Simple endometrial hyperplasia without atypia 02/11/2015  . Change in bowel habits 02/07/2015  . Thickened endometrium 01/15/2015  . PMB (postmenopausal bleeding) 11/26/2014  . Urge incontinence 11/26/2014  . Cerebral embolism with cerebral infarction ([place]) 07/27/2014  . Left leg  weakness 07/26/2014  . Right frontal lobe lesion 07/26/2014  . Hyperlipidemia 03/13/2013  . Sleep apnea 03/13/2013  . New onset atrial fibrillation ([place]) 03/13/2013  . Near syncope 03/13/2013  . Atrial fibrillation ([place]) 03/12/2013  . HYPERCHOLESTEROLEMIA 05/09/2009  . SINUSITIS 05/09/2009  . BUNION, RIGHT FOOT 05/09/2009  . SHINGLES 11/07/2008  . SUPRASPINATUS TENDINITIS 08/28/2008  . DIABETES MELLITUS, CONTROLLED 08/21/2008  . SHOULDER PAIN, LEFT 08/21/2008  . SPRAIN AND STRAIN OF STERNOCLAVICULAR 07/04/2008  . OBESITY, MORBID 02/02/2008  . PROTEINURIA 02/02/2008  . DM (diabetes mellitus), type 2 with complications ([place]) 67/89/3810  . ANEMIA 12/20/2007  . Essential hypertension, benign 12/20/2007  . ASTHMA 12/20/2007  . Unspecified arthropathy, lower leg 12/20/2007    Orientation RESPIRATION BLADDER Height & Weight     Self, Time, Situation, Place  Normal Incontinent Weight: 275 lb 5.7 oz (124.9 kg) Height:  5' (152.4 cm)  BEHAVIORAL SYMPTOMS/MOOD NEUROLOGICAL BOWEL NUTRITION STATUS  Other (Comment) (n/a)  (n/a) Incontinent Diet (heart healthy)  AMBULATORY STATUS COMMUNICATION OF NEEDS Skin   Total Care Verbally Normal                       Personal Care Assistance Level of Assistance  Bathing, Feeding, Dressing Bathing Assistance: Maximum assistance Feeding assistance: Limited assistance Dressing Assistance: Maximum assistance     Functional Limitations Info  Speech, Sight, Hearing Sight Info: Adequate Hearing Info: Adequate Speech Info: Impaired    SPECIAL CARE FACTORS FREQUENCY                       Contractures      Additional Factors Info  Code Status, Allergies Code  Status Info: Full code Allergies Info: Imitrex, Mango flaver, Oysters           Current Medications (04/21/2016):  This is the current hospital active medication list Current Facility-Administered Medications  Medication Dose Route Frequency Provider Last Rate Last Dose   .  stroke: mapping our early stages of recovery book   Does not apply Once Phillips Grout, MD      . albuterol (PROVENTIL) (2.5 MG/3ML) 0.083% nebulizer solution 3 mL  3 mL Inhalation Q4H PRN Phillips Grout, MD      . apixaban (ELIQUIS) tablet 5 mg  5 mg Oral BID Phillips Grout, MD   5 mg at 04/21/16 3329  . aspirin suppository 300 mg  300 mg Rectal Daily Phillips Grout, MD       Or  . aspirin tablet 325 mg  325 mg Oral Daily Phillips Grout, MD   325 mg at 04/21/16 5188  . atorvastatin (LIPITOR) tablet 80 mg  80 mg Oral QHS Phillips Grout, MD   80 mg at 04/21/16 0000  . diltiazem (CARDIZEM CD) 24 hr capsule 180 mg  180 mg Oral Daily Phillips Grout, MD   180 mg at 04/21/16 4166  . furosemide (LASIX) tablet 20 mg  20 mg Oral BID Phillips Grout, MD   20 mg at 04/21/16 0630  . gabapentin (NEURONTIN) capsule 100 mg  100 mg Oral TID Phillips Grout, MD   100 mg at 04/21/16 0921  . ipratropium-albuterol (DUONEB) 0.5-2.5 (3) MG/3ML nebulizer solution 3 mL  3 mL Inhalation Q6H PRN Phillips Grout, MD         Discharge Medications: Please see discharge summary for a list of discharge medications.  Relevant Imaging Results:  Relevant Lab Results:   Additional Information SS#: [id number]  Salome Arnt, [place]

## 2016-04-22 ENCOUNTER — Observation Stay (HOSPITAL_COMMUNITY): Payer: Commercial Managed Care - HMO

## 2016-04-22 DIAGNOSIS — I639 Cerebral infarction, unspecified: Secondary | ICD-10-CM | POA: Diagnosis not present

## 2016-04-22 DIAGNOSIS — R4781 Slurred speech: Secondary | ICD-10-CM | POA: Diagnosis not present

## 2016-04-22 DIAGNOSIS — R471 Dysarthria and anarthria: Secondary | ICD-10-CM | POA: Diagnosis not present

## 2016-04-22 DIAGNOSIS — I4891 Unspecified atrial fibrillation: Secondary | ICD-10-CM | POA: Diagnosis not present

## 2016-04-22 DIAGNOSIS — I10 Essential (primary) hypertension: Secondary | ICD-10-CM | POA: Diagnosis not present

## 2016-04-22 LAB — GLUCOSE, CAPILLARY
Glucose-Capillary: 91 mg/dL (ref 65–99)
Glucose-Capillary: 114 mg/dL — ABNORMAL HIGH (ref 65–99)
Glucose-Capillary: 98 mg/dL (ref 65–99)
Glucose-Capillary: 107 mg/dL — ABNORMAL HIGH (ref 65–99)

## 2016-04-22 LAB — HEMOGLOBIN A1C
Hgb A1c MFr Bld: 6.2 % — ABNORMAL HIGH (ref 4.8–5.6)
Mean Plasma Glucose: 131 mg/dL

## 2016-04-22 MED ORDER — ACETAMINOPHEN 325 MG PO TABS
650.0000 mg | ORAL_TABLET | Freq: Four times a day (QID) | ORAL | Status: DC | PRN
  Administered 2016-04-22: 650 mg via ORAL
  Filled 2016-04-22: qty 2

## 2016-04-22 NOTE — Plan of Care (Signed)
Problem: Coping: Goal: Ability to identify strategies to decrease anxiety will improve Outcome: Completed/Met Date Met:  04/22/16 Strong family support  Problem: Activity: Goal: Risk for activity intolerance will decrease Outcome: Adequate for Discharge At baseline

## 2016-04-22 NOTE — Evaluation (Signed)
Speech Language Pathology Evaluation Patient Details Name: [name] MRN: [id number] DOB: 1941/11/10 Today's Date: 04/22/2016 Time: 1027-     Problem List:  Patient Active Problem List   Diagnosis Date Noted  . Stroke (cerebrum) ([place]) 04/20/2016  . Slurred speech 04/20/2016  . Right knee pain 01/21/2016  . Spastic hemiplegia affecting nondominant side ([place]) 09/19/2015  . Type 2 diabetes mellitus with other circulatory complications ([place]) 25/36/6440  . Essential hypertension 07/29/2015  . HLD (hyperlipidemia) 07/29/2015  . Complex regional pain syndrome I of upper limb 06/27/2015  . Hemiparesis affecting left side as late effect of cerebrovascular accident ([place]) 06/27/2015  . Cerebral infarction due to embolism of right anterior cerebral artery ([place]) 05/14/2015  . Embolic cerebral infarction ([place]) 05/14/2015  . Cerebral infarction due to unspecified mechanism   . Paroxysmal atrial fibrillation ([facility])   . Stroke ([place])   . Postmenopausal bleeding   . Left-sided weakness 05/10/2015  . CVA (cerebral vascular accident) ([place]) 05/10/2015  . Complex endometrial hyperplasia without atypia 02/11/2015  . Simple endometrial hyperplasia without atypia 02/11/2015  . Change in bowel habits 02/07/2015  . Thickened endometrium 01/15/2015  . PMB (postmenopausal bleeding) 11/26/2014  . Urge incontinence 11/26/2014  . Cerebral embolism with cerebral infarction ([place]) 07/27/2014  . Left leg weakness 07/26/2014  . Right frontal lobe lesion 07/26/2014  . Hyperlipidemia 03/13/2013  . Sleep apnea 03/13/2013  . New onset atrial fibrillation ([place]) 03/13/2013  . Near syncope 03/13/2013  . Atrial fibrillation ([place]) 03/12/2013  . HYPERCHOLESTEROLEMIA 05/09/2009  . SINUSITIS 05/09/2009  . BUNION, RIGHT FOOT 05/09/2009  . SHINGLES 11/07/2008  . SUPRASPINATUS TENDINITIS 08/28/2008  . DIABETES MELLITUS, CONTROLLED 08/21/2008  . SHOULDER PAIN, LEFT 08/21/2008  . SPRAIN AND STRAIN OF STERNOCLAVICULAR  07/04/2008  . OBESITY, MORBID 02/02/2008  . PROTEINURIA 02/02/2008  . DM (diabetes mellitus), type 2 with complications ([place]) 34/74/2595  . ANEMIA 12/20/2007  . Essential hypertension, benign 12/20/2007  . ASTHMA 12/20/2007  . Unspecified arthropathy, lower leg 12/20/2007   Past Medical History:  Past Medical History  Diagnosis Date  . Diabetes mellitus   . Asthma   . Chronic knee pain   . Back pain, chronic   . Hypertension   . Vertigo   . Hyperlipidemia   . TIA (transient ischemic attack)   . Anemia   . HOH (hard of hearing)   . Arthritis   . Obesity   . PMB (postmenopausal bleeding) 11/26/2014  . Urge incontinence 11/26/2014  . Thickened endometrium 01/15/2015  . Dysrhythmia   . Stroke [facility])     Mini Stroke   Past Surgical History:  Past Surgical History  Procedure Laterality Date  . Appendectomy    . Ovary surgery    . Cataract extraction w/phaco Left 01/16/2013    Procedure: CATARACT EXTRACTION PHACO AND INTRAOCULAR LENS PLACEMENT (IOC);  Surgeon: Elta Guadeloupe T. Gershon Crane, MD;  Location: [facility] ORS;  Service: Ophthalmology;  Laterality: Left;  CDE:14.37  . Endometrial biopsy  04/03/2013       . Hysteroscopy w/d&c N/A 04/24/2013    Procedure: SUCTION DILATATION AND CURETTAGE /HYSTEROSCOPY;  Surgeon: Jonnie Kind, MD;  Location: [facility] ORS;  Service: Gynecology;  Laterality: N/A;  . Knee arthroscopy with medial menisectomy Left 07/26/2013    Procedure: KNEE ARTHROSCOPY WITH PARTIAL MEDIAL MENISECTOMY;  Surgeon: Sanjuana Kava, MD;  Location: [facility] ORS;  Service: Orthopedics;  Laterality: Left;  . Endometrial ablation    . Colonoscopy  2009    Dr. Wilford Corner: 4 hyperplastic polyps  removed. Small internal hemorrhoids. Recommended 5 year follow-up colonoscopy.  . Colonoscopy N/A 02/24/2015    Procedure: COLONOSCOPY;  Surgeon: Danie Binder, MD;  Location: [facility] END[facility] SUITE;  Service: Endoscopy;  Laterality: N/A;  1030  . Hysteroscopy w/d&c N/A 04/02/2015    Procedure: UTERINE CURETTAGE,  HYSTEROSCOPY;  Surgeon: Florian Buff, MD;  Location: [facility] OR[facility];  Service: Gynecology;  Laterality: N/A;   HPI:  Pt is a 75 yo female h/o multiple CVAs with residual left sided weakness, DM, HTN, obesity lives at [facility] sent in for acute onset of slurred speech since Sunday (over 24 hours). Pt says she has a hard time saying what she wants to say, and it is better than yesterday. No fevers. No new medications. She Reports she is basicaally mostly wheelchair bound and is getting physical therapy at [facility]. Denies any recent illnesses. On eloquis. Pt referred for admission for concerns of possible new stroke. CT of head negative for acute abnormalities. Per MD notes, MRI of brain was attempted unsuccessfully secondary to pts large body habitus    Assessment / Plan / Recommendation Clinical Impression  Pt presents with mildly reduced intelligibility of speech characterized by reduced articulation of sounds and reduced loudness. RN reports signficant improvements in speech clarity since yesterday and that pts daughter reports current speech function is at her baseline. No family or caregiver present during speech language evaluation this date.  Oral motor exam unremarkable, though pt with lingual coating, not fully improved with oral care by SLP. Current neurological imaging without acute abnormalities. Pt presents with cognitive deficits, however question whether they are at her baseline given hx of multiple CVAs. Also note, reduced level of functioning/reduced cognitive stimulation with pt requiring dependent care at SNF previously. Cognitive deficits characterized by reduced mental flexibility, reduced thought organzation, decreased executive funciton skills, and decreased novel recall. No ST needs identified as aformentioned deficits felt to be at baseline. ST to sign off.     SLP Assessment  Patient does not need any further Speech Lanaguage Pathology Services    Follow Up Recommendations  Skilled  Nursing facility    Frequency and Duration           SLP Evaluation Prior Functioning  Cognitive/Linguistic Baseline: Baseline deficits Baseline deficit details: prior CVA Type of Home: [place] Available Help at Discharge: [place] Education: 12th grade   Cognition  Overall Cognitive Status: Difficult to assess Orientation Level: Disoriented to time;Oriented to person;Oriented to place Memory: Impaired Memory Impairment: Decreased recall of new information;Decreased short term memory Executive Function: Reasoning;Self Monitoring Reasoning: Impaired Self Monitoring: Impaired    Comprehension  Auditory Comprehension Overall Auditory Comprehension: Appears within functional limits for tasks assessed Interfering Components: Processing speed;Working Field seismologist: Extra processing time;Increased volume;Repetition Visual Recognition/Discrimination Discrimination:  (pt states use of glasses at baseline , which are at SNF )    Expression Expression Primary Mode of Expression: Verbal Written Expression Dominant Hand: Right   Oral / Motor  Oral Motor/Sensory Function Overall Oral Motor/Sensory Function: Generalized oral weakness Motor Speech Overall Motor Speech: Impaired Respiration:  (effortful, reports SOB easily ) Phonation: Low vocal intensity Articulation: Impaired Level of Impairment: Word Intelligibility: Intelligibility reduced Word: 75-100% accurate Phrase: 75-100% accurate Sentence: 75-100% accurate Conversation: 75-100% accurate Motor Planning: Witnin functional limits   GO          Functional Assessment Tool Used: skilled observation  Functional Limitations: Motor speech Motor Speech Current Status [phone]): At least 1 percent but less  than 20 percent impaired, limited or restricted Motor Speech Goal Status [phone]): At least 1 percent but less than 20 percent impaired, limited or restricted Motor Speech Goal  Status [phone]): At least 1 percent but less than 20 percent impaired, limited or restricted         Arvil Chaco MA, [place] Pathologist    [name] 04/22/2016, 3:47 PM

## 2016-04-22 NOTE — Progress Notes (Signed)
[place] A. Merlene Laughter, MD     [email or web address]          [name] is an 75 y.o. female.   Assessment/Plan: 1. Acute dysarthria most likely due to subcortical infarct. Risk factors chronic atrial fibrillation, diabetes, hypertension, previous infarct, dyslipidemia and obstructive sleep apnea syndrome.  2. Undiagnosed and untreated obstructive sleep apnea syndrome.  RECOMMENDATION: I think we may consider continuing the addition of aspirin to her current anticoagulation for one month. Afterwards, the patient's aspirin should be discontinued and the anticoagulation continued to reduce risk of hemorrhage.   Outpatient diagnostic polysomnography.  Physical and occupational therapies.    GENERAL: This is a very pleasant morbidly obese female in no acute distress.  HEENT: Supple. Atraumatic normocephalic. She has a large neck and a large tongue and crowded posterior air space.  ABDOMEN: soft  EXTREMITIES: No edema   BACK: Normal.  SKIN: Normal by inspection.   MENTAL STATUS: Alert and oriented.  Speech is minimally dysarthric; language and cognition are generally intact. Judgment and insight normal.   CRANIAL NERVES: Pupils are equal, round and reactive to light and accommodation; extra ocular movements are full, there is no significant nystagmus; visual fields are full; upper and lower facial muscles are normal in strength and symmetric, there is no flattening of the nasolabial folds; tongue is midline; uvula is midline; shoulder elevation is normal.  MOTOR: She has antigravity strength and at least 4/5 in the upper extremities ( L hand 3/5). Lower extremities 3/5. There is mild pronator drift left upper extremity. Mild drift left leg. No drift on the right side.  COORDINATION: Left finger to nose is normal, right finger to nose is normal, No rest tremor; no intention tremor; no postural tremor; no bradykinesia.  REFLEXES: Deep tendon reflexes are  symmetrical and normal.   SENSATION: Normal to light touch. No extinction to double simultaneous stimulation.    Objective: Vital signs in last 24 hours: Temp:  [98 F (36.7 C)-98.7 F (37.1 C)] 98.7 F (37.1 C) (05/18 1545) Pulse Rate:  [51-119] 77 (05/18 1545) Resp:  [15-20] 20 (05/18 1545) BP: (121-145)/(61-95) 121/68 mmHg (05/18 1545) SpO2:  [98 %-100 %] 100 % (05/18 1545)  Intake/Output from previous day: 05/17 0701 - 05/18 0700 In: 360 [P.O.:360] Out: -  Intake/Output this shift: Total I/O In: 240 [P.O.:240] Out: -  Nutritional status: Diet Heart Room service appropriate?: Yes; Fluid consistency:: Thin   Lab Results: Results for orders placed or performed during the hospital encounter of 04/20/16 (from the past 48 hour(s))  CBC with Differential/Platelet     Status: None   Collection Time: 04/20/16  7:09 PM  Result Value Ref Range   WBC 4.0 4.0 - 10.5 K/uL   RBC 4.40 3.87 - 5.11 MIL/uL   Hemoglobin 14.9 12.0 - 15.0 g/dL   HCT 43.7 36.0 - 46.0 %   MCV 99.3 78.0 - 100.0 fL   MCH 33.9 26.0 - 34.0 pg   MCHC 34.1 30.0 - 36.0 g/dL   RDW 13.0 11.5 - 15.5 %   Platelets 177 150 - 400 K/uL   Neutrophils Relative % 51 %   Neutro Abs 2.1 1.7 - 7.7 K/uL   Lymphocytes Relative 38 %   Lymphs Abs 1.5 0.7 - 4.0 K/uL   Monocytes Relative 10 %   Monocytes Absolute 0.4 0.1 - 1.0 K/uL   Eosinophils Relative 1 %   Eosinophils Absolute 0.0 0.0 - 0.7 K/uL   Basophils Relative 0 %  Basophils Absolute 0.0 0.0 - 0.1 K/uL  Comprehensive metabolic panel     Status: Abnormal   Collection Time: 04/20/16  7:09 PM  Result Value Ref Range   Sodium 139 135 - 145 mmol/L   Potassium 3.6 3.5 - 5.1 mmol/L   Chloride 104 101 - 111 mmol/L   CO2 27 22 - 32 mmol/L   Glucose, Bld 85 65 - 99 mg/dL   BUN 16 6 - 20 mg/dL   Creatinine, Ser 1.01 (H) 0.44 - 1.00 mg/dL   Calcium 9.4 8.9 - 10.3 mg/dL   Total Protein 7.3 6.5 - 8.1 g/dL   Albumin 3.6 3.5 - 5.0 g/dL   AST 17 15 - 41 U/L   ALT 20 14  - 54 U/L   Alkaline Phosphatase 50 38 - 126 U/L   Total Bilirubin 1.3 (H) 0.3 - 1.2 mg/dL   GFR calc non Af Amer 54 (L) >60 mL/min   GFR calc Af Amer >60 >60 mL/min    Comment: (NOTE) The eGFR has been calculated using the CKD EPI equation. This calculation has not been validated in all clinical situations. eGFR's persistently <60 mL/min signify possible Chronic Kidney Disease.    Anion gap 8 5 - 15  Hemoglobin A1c     Status: Abnormal   Collection Time: 04/20/16  7:09 PM  Result Value Ref Range   Hgb A1c MFr Bld 6.2 (H) 4.8 - 5.6 %    Comment: (NOTE)         Pre-diabetes: 5.7 - 6.4         Diabetes: >6.4         Glycemic control for adults with diabetes: <7.0    Mean Plasma Glucose 131 mg/dL    Comment: (NOTE) Performed At: [facility] [place], [place] [id number] Lindon Romp MD RK:[id number]   MRSA PCR Screening     Status: None   Collection Time: 04/21/16 12:04 AM  Result Value Ref Range   MRSA by PCR NEGATIVE NEGATIVE    Comment:        The GeneXpert MRSA Assay (FDA approved for NASAL specimens only), is one component of a comprehensive MRSA colonization surveillance program. It is not intended to diagnose MRSA infection nor to guide or monitor treatment for MRSA infections.   Lipid panel     Status: Abnormal   Collection Time: 04/21/16  4:56 AM  Result Value Ref Range   Cholesterol 144 0 - 200 mg/dL   Triglycerides 40 <150 mg/dL   HDL 39 (L) >40 mg/dL   Total CHOL/HDL Ratio 3.7 RATIO   VLDL 8 0 - 40 mg/dL   LDL Cholesterol 97 0 - 99 mg/dL    Comment:        Total Cholesterol/HDL:CHD Risk Coronary Heart Disease Risk Table                     Men   Women  1/2 Average Risk   3.4   3.3  Average Risk       5.0   4.4  2 X Average Risk   9.6   7.1  3 X Average Risk  23.4   11.0        Use the calculated Patient Ratio above and the CHD Risk Table to determine the patient's CHD Risk.        ATP III CLASSIFICATION (LDL):  <100      mg/dL   Optimal  100-129  mg/dL   Near or Above                    Optimal  130-159  mg/dL   Borderline  160-189  mg/dL   High  >190     mg/dL   Very High   Glucose, capillary     Status: Abnormal   Collection Time: 04/21/16 12:47 PM  Result Value Ref Range   Glucose-Capillary 114 (H) 65 - 99 mg/dL  Glucose, capillary     Status: None   Collection Time: 04/21/16  5:40 PM  Result Value Ref Range   Glucose-Capillary 80 65 - 99 mg/dL  Glucose, capillary     Status: Abnormal   Collection Time: 04/21/16 11:01 PM  Result Value Ref Range   Glucose-Capillary 103 (H) 65 - 99 mg/dL   Comment 1 Notify RN    Comment 2 Document in Chart   Glucose, capillary     Status: None   Collection Time: 04/22/16  7:48 AM  Result Value Ref Range   Glucose-Capillary 91 65 - 99 mg/dL  Glucose, capillary     Status: Abnormal   Collection Time: 04/22/16 11:34 AM  Result Value Ref Range   Glucose-Capillary 114 (H) 65 - 99 mg/dL  Glucose, capillary     Status: None   Collection Time: 04/22/16  4:27 PM  Result Value Ref Range   Glucose-Capillary 98 65 - 99 mg/dL    Lipid Panel  Recent Labs  04/21/16 0456  CHOL 144  TRIG 40  HDL 39*  CHOLHDL 3.7  VLDL 8  LDLCALC 97    Studies/Results:   Medications:  Scheduled Meds: .  stroke: mapping our early stages of recovery book   Does not apply Once  . apixaban  5 mg Oral BID  . aspirin  300 mg Rectal Daily   Or  . aspirin  325 mg Oral Daily  . atorvastatin  80 mg Oral QHS  . diltiazem  180 mg Oral Daily  . furosemide  20 mg Oral BID  . gabapentin  100 mg Oral TID   Continuous Infusions:  PRN Meds:.acetaminophen, albuterol, ipratropium-albuterol       Laquonda Welby A. Merlene Laughter, M.D.  Diplomate, Tax adviser of Psychiatry and Neurology ( Neurology).

## 2016-04-22 NOTE — Consult Note (Signed)
   [facility] CM Inpatient Consult   04/22/2016  [name] 08/12/1941 [id number]  Patient screened for potential [place] Management services. Patient is eligible for [place], however, electronic medical record reveals patient's discharge plan is for short term skilled and then remain long term care, there were no identifiable [facility] care management needs at this time. [facility] Care Management services not appropriate at this time. If patient's post hospital needs change please place a [facility] Care Management consult.  Of note, [facility] Care Management services does not replace or interfere with any services that are arranged by inpatient case management or social work.  For questions please contact:  Royetta Crochet. Laymond Purser, RN, BSN, [facility] Liaison [phone]

## 2016-04-22 NOTE — Progress Notes (Signed)
Phone call made to Dr. Luan Pulling reporting that radiology is unable to do MRI due to patient's body composition. Message will be given to Dr. Doralee Albino in the morning on day shift.

## 2016-04-22 NOTE — Progress Notes (Signed)
Subjective: Patient feels weak on left side. Her speech is still slurred but improving. She has no problem swallowing. MRI was ordered by her neurologist but couldn't fit into the machine due to her weight.  Objective: Vital signs in last 24 hours: Temp:  [98 F (36.7 C)-98.6 F (37 C)] 98.6 F (37 C) (05/18 0545) Pulse Rate:  [50-85] 64 (05/18 0545) Resp:  [15-20] 15 (05/18 0545) BP: (122-138)/(68-86) 122/69 mmHg (05/18 0545) SpO2:  [96 %-100 %] 98 % (05/18 0545) Weight change:  Last BM Date: 04/20/16  Intake/Output from previous day: 05/17 0701 - 05/18 0700 In: 360 [P.O.:360] Out: -   PHYSICAL EXAM General appearance: no distress and slowed mentation Resp: clear to auscultation bilaterally Cardio: irregularly irregular rhythm GI: soft, non-tender; bowel sounds normal; no masses,  no organomegaly Extremities: extremities normal, atraumatic, no cyanosis or edema  Lab Results:  Results for orders placed or performed during the hospital encounter of 04/20/16 (from the past 48 hour(s))  CBC with Differential/Platelet     Status: None   Collection Time: 04/20/16  7:09 PM  Result Value Ref Range   WBC 4.0 4.0 - 10.5 K/uL   RBC 4.40 3.87 - 5.11 MIL/uL   Hemoglobin 14.9 12.0 - 15.0 g/dL   HCT 43.7 36.0 - 46.0 %   MCV 99.3 78.0 - 100.0 fL   MCH 33.9 26.0 - 34.0 pg   MCHC 34.1 30.0 - 36.0 g/dL   RDW 13.0 11.5 - 15.5 %   Platelets 177 150 - 400 K/uL   Neutrophils Relative % 51 %   Neutro Abs 2.1 1.7 - 7.7 K/uL   Lymphocytes Relative 38 %   Lymphs Abs 1.5 0.7 - 4.0 K/uL   Monocytes Relative 10 %   Monocytes Absolute 0.4 0.1 - 1.0 K/uL   Eosinophils Relative 1 %   Eosinophils Absolute 0.0 0.0 - 0.7 K/uL   Basophils Relative 0 %   Basophils Absolute 0.0 0.0 - 0.1 K/uL  Comprehensive metabolic panel     Status: Abnormal   Collection Time: 04/20/16  7:09 PM  Result Value Ref Range   Sodium 139 135 - 145 mmol/L   Potassium 3.6 3.5 - 5.1 mmol/L   Chloride 104 101 - 111 mmol/L    CO2 27 22 - 32 mmol/L   Glucose, Bld 85 65 - 99 mg/dL   BUN 16 6 - 20 mg/dL   Creatinine, Ser 1.01 (H) 0.44 - 1.00 mg/dL   Calcium 9.4 8.9 - 10.3 mg/dL   Total Protein 7.3 6.5 - 8.1 g/dL   Albumin 3.6 3.5 - 5.0 g/dL   AST 17 15 - 41 U/L   ALT 20 14 - 54 U/L   Alkaline Phosphatase 50 38 - 126 U/L   Total Bilirubin 1.3 (H) 0.3 - 1.2 mg/dL   GFR calc non Af Amer 54 (L) >60 mL/min   GFR calc Af Amer >60 >60 mL/min    Comment: (NOTE) The eGFR has been calculated using the CKD EPI equation. This calculation has not been validated in all clinical situations. eGFR's persistently <60 mL/min signify possible Chronic Kidney Disease.    Anion gap 8 5 - 15  Hemoglobin A1c     Status: Abnormal   Collection Time: 04/20/16  7:09 PM  Result Value Ref Range   Hgb A1c MFr Bld 6.2 (H) 4.8 - 5.6 %    Comment: (NOTE)         Pre-diabetes: 5.7 - 6.4  Diabetes: >6.4         Glycemic control for adults with diabetes: <7.0    Mean Plasma Glucose 131 mg/dL    Comment: (NOTE) Performed At: [facility] [place], [place] [id number] Lindon Romp MD WK:[id number]   MRSA PCR Screening     Status: None   Collection Time: 04/21/16 12:04 AM  Result Value Ref Range   MRSA by PCR NEGATIVE NEGATIVE    Comment:        The GeneXpert MRSA Assay (FDA approved for NASAL specimens only), is one component of a comprehensive MRSA colonization surveillance program. It is not intended to diagnose MRSA infection nor to guide or monitor treatment for MRSA infections.   Lipid panel     Status: Abnormal   Collection Time: 04/21/16  4:56 AM  Result Value Ref Range   Cholesterol 144 0 - 200 mg/dL   Triglycerides 40 <150 mg/dL   HDL 39 (L) >40 mg/dL   Total CHOL/HDL Ratio 3.7 RATIO   VLDL 8 0 - 40 mg/dL   LDL Cholesterol 97 0 - 99 mg/dL    Comment:        Total Cholesterol/HDL:CHD Risk Coronary Heart Disease Risk Table                     Men   Women  1/2 Average Risk    3.4   3.3  Average Risk       5.0   4.4  2 X Average Risk   9.6   7.1  3 X Average Risk  23.4   11.0        Use the calculated Patient Ratio above and the CHD Risk Table to determine the patient's CHD Risk.        ATP III CLASSIFICATION (LDL):  <100     mg/dL   Optimal  100-129  mg/dL   Near or Above                    Optimal  130-159  mg/dL   Borderline  160-189  mg/dL   High  >190     mg/dL   Very High   Glucose, capillary     Status: Abnormal   Collection Time: 04/21/16 12:47 PM  Result Value Ref Range   Glucose-Capillary 114 (H) 65 - 99 mg/dL  Glucose, capillary     Status: None   Collection Time: 04/21/16  5:40 PM  Result Value Ref Range   Glucose-Capillary 80 65 - 99 mg/dL  Glucose, capillary     Status: Abnormal   Collection Time: 04/21/16 11:01 PM  Result Value Ref Range   Glucose-Capillary 103 (H) 65 - 99 mg/dL   Comment 1 Notify RN    Comment 2 Document in Chart   Glucose, capillary     Status: None   Collection Time: 04/22/16  7:48 AM  Result Value Ref Range   Glucose-Capillary 91 65 - 99 mg/dL    ABGS No results for input(s): PHART, PO2ART, TCO2, HCO3 in the last 72 hours.  Invalid input(s): PCO2 CULTURES Recent Results (from the past 240 hour(s))  MRSA PCR Screening     Status: None   Collection Time: 04/21/16 12:04 AM  Result Value Ref Range Status   MRSA by PCR NEGATIVE NEGATIVE Final    Comment:        The GeneXpert MRSA Assay (FDA approved for NASAL specimens only), is one component of  a comprehensive MRSA colonization surveillance program. It is not intended to diagnose MRSA infection nor to guide or monitor treatment for MRSA infections.    Studies/Results: Ct Head Wo Contrast  04/20/2016  CLINICAL DATA:  Slurred speech for 2 days, initial encounter EXAM: CT HEAD WITHOUT CONTRAST TECHNIQUE: Contiguous axial images were obtained from the base of the skull through the vertex without intravenous contrast. COMPARISON:  12/30/2015 FINDINGS:  Bony calvarium is intact. Mild atrophic changes and chronic white matter ischemic changes are again seen and stable. No findings to suggest acute hemorrhage, acute infarction or space-occupying mass lesion are noted. IMPRESSION: Atrophic and ischemic changes.  No acute abnormality is noted. Electronically Signed   By: Inez Catalina M.D.   On: 04/20/2016 18:48   US Carotid Bilateral  04/21/2016  CLINICAL DATA:  Speech abnormality for 1 day EXAM: BILATERAL CAROTID DUPLEX ULTRASOUND TECHNIQUE: Pearline Cables scale imaging, color Doppler and duplex ultrasound were performed of bilateral carotid and vertebral arteries in the neck. COMPARISON:  None. FINDINGS: Criteria: Quantification of carotid stenosis is based on velocity parameters that correlate the residual internal carotid diameter with NASCET-based stenosis levels, using the diameter of the distal internal carotid lumen as the denominator for stenosis measurement. The following velocity measurements were obtained: RIGHT ICA:  45/11 cm/sec CCA:  67/6 cm/sec SYSTOLIC ICA/CCA RATIO:  0.9 DIASTOLIC ICA/CCA RATIO:  2.5 ECA:  69 cm/sec LEFT ICA:  57/23 cm/sec CCA:  72/0 cm/sec SYSTOLIC ICA/CCA RATIO:  0.8 DIASTOLIC ICA/CCA RATIO:  2.8 ECA:  96 cm/sec RIGHT CAROTID ARTERY: Grayscale images demonstrate mild plaque formation in the carotid bulb and proximal internal carotid artery. The waveforms, velocities and flow velocity ratios show no evidence of focal hemodynamically significant stenosis. RIGHT VERTEBRAL ARTERY:  Antegrade in nature. LEFT CAROTID ARTERY: Grayscale images demonstrate plaque formation in the carotid bulb and proximal internal carotid artery similar to that seen on the right. The waveforms, velocities and flow velocity ratios however demonstrate no evidence of focal hemodynamically significant stenosis. LEFT VERTEBRAL ARTERY:  Antegrade in nature. IMPRESSION: Bilateral atherosclerotic plaque without focal stenosis. Electronically Signed   By: Inez Catalina M.D.    On: 04/21/2016 10:08   Dg Chest Portable 1 View  04/20/2016  CLINICAL DATA:  Slurred speech.     LEFT-sided weakness. EXAM: PORTABLE CHEST 1 VIEW COMPARISON:  01/01/2016. FINDINGS: Low lung volumes. Cardiomegaly. No active infiltrates or failure. No pneumothorax. Bones unremarkable. IMPRESSION: Cardiomegaly.  Low lung volumes.  No active infiltrates or failure. Electronically Signed   By: Staci Righter M.D.   On: 04/20/2016 19:00    Medications: I have reviewed the patient's current medications.  Assesment:   Principal Problem:   Slurred speech Active Problems:   DM (diabetes mellitus), type 2 with complications (HCC)   Essential hypertension, benign   Atrial fibrillation (HCC)   Paroxysmal atrial fibrillation (HCC)   Hemiparesis affecting left side as late effect of cerebrovascular accident [facility])    Plan:  Medications reviewed Will continue current treatment  neurology consult appreciated Agree with physical therapy recommendation to admit to SNF on discharge.      [name] 04/22/2016, 8:03 AM

## 2016-04-23 DIAGNOSIS — R4781 Slurred speech: Secondary | ICD-10-CM | POA: Diagnosis not present

## 2016-04-23 DIAGNOSIS — Z7401 Bed confinement status: Secondary | ICD-10-CM | POA: Diagnosis not present

## 2016-04-23 DIAGNOSIS — R279 Unspecified lack of coordination: Secondary | ICD-10-CM | POA: Diagnosis not present

## 2016-04-23 DIAGNOSIS — I639 Cerebral infarction, unspecified: Secondary | ICD-10-CM | POA: Diagnosis not present

## 2016-04-23 DIAGNOSIS — I4891 Unspecified atrial fibrillation: Secondary | ICD-10-CM | POA: Diagnosis not present

## 2016-04-23 DIAGNOSIS — I10 Essential (primary) hypertension: Secondary | ICD-10-CM | POA: Diagnosis not present

## 2016-04-23 LAB — CBC
HCT: 43.2 % (ref 36.0–46.0)
Hemoglobin: 14.9 g/dL (ref 12.0–15.0)
MCH: 34.3 pg — ABNORMAL HIGH (ref 26.0–34.0)
MCHC: 34.5 g/dL (ref 30.0–36.0)
MCV: 99.3 fL (ref 78.0–100.0)
Platelets: 188 10*3/uL (ref 150–400)
RBC: 4.35 MIL/uL (ref 3.87–5.11)
RDW: 12.9 % (ref 11.5–15.5)
WBC: 3.6 10*3/uL — ABNORMAL LOW (ref 4.0–10.5)

## 2016-04-23 LAB — GLUCOSE, CAPILLARY: Glucose-Capillary: 80 mg/dL (ref 65–99)

## 2016-04-23 MED ORDER — ASPIRIN 325 MG PO TABS
325.0000 mg | ORAL_TABLET | Freq: Every day | ORAL | Status: DC
  Administered 2016-04-23: 325 mg via ORAL
  Filled 2016-04-23: qty 1

## 2016-04-23 MED ORDER — ASPIRIN 300 MG RE SUPP: 300.0000 mg | Freq: Every day | RECTAL | Status: DC

## 2016-04-23 NOTE — Progress Notes (Signed)
Patient transferred to [place] facility, report called,and given to [facility] LPN. Vital signs stable. EMS to transport patient to awaiting facility.

## 2016-04-23 NOTE — Discharge Summary (Signed)
Physician Discharge Summary  Patient ID: [name] MRN: [id number] DOB/AGE: Oct 17, 1941 75 y.o. [place], [place] Admit date: 04/20/2016 Discharge date: 04/23/2016    Discharge Diagnoses:   Principal Problem:   Slurred speech Active Problems:   DM (diabetes mellitus), type 2 with complications ([place])   Essential hypertension, benign   Atrial fibrillation (HCC)   Paroxysmal atrial fibrillation (HCC)   Hemiparesis affecting left side as late effect of cerebrovascular accident ([place])     Medication List    TAKE these medications        acetaminophen 325 MG tablet  Commonly known as:  TYLENOL  Take 650 mg by mouth every 6 (six) hours as needed for mild pain.     atorvastatin 20 MG tablet  Commonly known as:  LIPITOR  Take 80 mg by mouth at bedtime.     bisacodyl 5 MG EC tablet  Commonly known as:  DULCOLAX  Take 5 mg by mouth daily as needed for moderate constipation.     cholecalciferol 1000 units tablet  Commonly known as:  VITAMIN D  Take 1 tablet (1,000 Units total) by mouth every morning.     colchicine 0.6 MG tablet  Take 0.3 mg by mouth 2 (two) times daily.     diltiazem 180 MG 24 hr capsule  Commonly known as:  CARDIZEM CD  Take 1 capsule (180 mg total) by mouth daily.     ELIQUIS 5 MG Tabs tablet  Generic drug:  apixaban  Take 5 mg by mouth 2 (two) times daily.     furosemide 20 MG tablet  Commonly known as:  LASIX  Take 20 mg by mouth 2 (two) times daily.     gabapentin 100 MG capsule  Commonly known as:  NEURONTIN  Take 100 mg by mouth 3 (three) times daily.     guaiFENesin 100 MG/5ML liquid  Commonly known as:  ROBITUSSIN  Take 100 mg by mouth every 4 (four) hours.     ipratropium-albuterol 0.5-2.5 (3) MG/3ML Soln  Commonly known as:  DUONEB  Inhale 3 mLs into the lungs every 6 (six) hours as needed.     KLOR-CON M20 20 MEQ tablet  Generic drug:  potassium chloride SA  Take 20 mEq by mouth daily.     LINZESS 290  MCG Caps capsule  Generic drug:  linaclotide  Take 290 mcg by mouth daily before breakfast.     megestrol 40 MG tablet  Commonly known as:  MEGACE  Take 40 mg by mouth 3 (three) times daily.     oxybutynin 5 MG 24 hr tablet  Commonly known as:  DITROPAN-XL  Take 5 mg by mouth daily.     risperiDONE 0.5 MG tablet  Commonly known as:  RISPERDAL  Take 0.5 mg by mouth 2 (two) times daily.     TRADJENTA 5 MG Tabs tablet  Generic drug:  linagliptin  Take 5 mg by mouth daily.     traMADol 50 MG tablet  Commonly known as:  ULTRAM  Take 50 mg by mouth daily. *Also takes every 6 hours as needed for pain     VENTOLIN HFA 108 (90 Base) MCG/ACT inhaler  Generic drug:  albuterol  Inhale 2 puffs into the lungs every 4 (four) hours as needed for wheezing or shortness of breath.        Discharged Condition: improved    Consults: neurology  Significant Diagnostic Studies: Ct Head Wo Contrast  04/20/2016  CLINICAL DATA:  Slurred speech for 2 days, initial encounter EXAM: CT HEAD WITHOUT CONTRAST TECHNIQUE: Contiguous axial images were obtained from the base of the skull through the vertex without intravenous contrast. COMPARISON:  12/30/2015 FINDINGS: Bony calvarium is intact. Mild atrophic changes and chronic white matter ischemic changes are again seen and stable. No findings to suggest acute hemorrhage, acute infarction or space-occupying mass lesion are noted. IMPRESSION: Atrophic and ischemic changes.  No acute abnormality is noted. Electronically Signed   By: Inez Catalina M.D.   On: 04/20/2016 18:48   US Carotid Bilateral  04/21/2016  CLINICAL DATA:  Speech abnormality for 1 day EXAM: BILATERAL CAROTID DUPLEX ULTRASOUND TECHNIQUE: Pearline Cables scale imaging, color Doppler and duplex ultrasound were performed of bilateral carotid and vertebral arteries in the neck. COMPARISON:  None. FINDINGS: Criteria: Quantification of carotid stenosis is based on velocity parameters that correlate the residual  internal carotid diameter with NASCET-based stenosis levels, using the diameter of the distal internal carotid lumen as the denominator for stenosis measurement. The following velocity measurements were obtained: RIGHT ICA:  45/11 cm/sec CCA:  98/1 cm/sec SYSTOLIC ICA/CCA RATIO:  0.9 DIASTOLIC ICA/CCA RATIO:  2.5 ECA:  69 cm/sec LEFT ICA:  57/23 cm/sec CCA:  19/1 cm/sec SYSTOLIC ICA/CCA RATIO:  0.8 DIASTOLIC ICA/CCA RATIO:  2.8 ECA:  96 cm/sec RIGHT CAROTID ARTERY: Grayscale images demonstrate mild plaque formation in the carotid bulb and proximal internal carotid artery. The waveforms, velocities and flow velocity ratios show no evidence of focal hemodynamically significant stenosis. RIGHT VERTEBRAL ARTERY:  Antegrade in nature. LEFT CAROTID ARTERY: Grayscale images demonstrate plaque formation in the carotid bulb and proximal internal carotid artery similar to that seen on the right. The waveforms, velocities and flow velocity ratios however demonstrate no evidence of focal hemodynamically significant stenosis. LEFT VERTEBRAL ARTERY:  Antegrade in nature. IMPRESSION: Bilateral atherosclerotic plaque without focal stenosis. Electronically Signed   By: Inez Catalina M.D.   On: 04/21/2016 10:08   Dg Chest Portable 1 View  04/20/2016  CLINICAL DATA:  Slurred speech.     LEFT-sided weakness. EXAM: PORTABLE CHEST 1 VIEW COMPARISON:  01/01/2016. FINDINGS: Low lung volumes. Cardiomegaly. No active infiltrates or failure. No pneumothorax. Bones unremarkable. IMPRESSION: Cardiomegaly.  Low lung volumes.  No active infiltrates or failure. Electronically Signed   By: Staci Righter M.D.   On: 04/20/2016 19:00    Lab Results: Basic Metabolic Panel:  Recent Labs  04/20/16 1909  NA 139  K 3.6  CL 104  CO2 27  GLUCOSE 85  BUN 16  CREATININE 1.01*  CALCIUM 9.4   Liver Function Tests:  Recent Labs  04/20/16 1909  AST 17  ALT 20  ALKPHOS 50  BILITOT 1.3*  PROT 7.3  ALBUMIN 3.6     CBC:  Recent  Labs  04/20/16 1909 04/23/16 0449  WBC 4.0 3.6*  NEUTROABS 2.1  --   HGB 14.9 14.9  HCT 43.7 43.2  MCV 99.3 99.3  PLT 177 188    Recent Results (from the past 240 hour(s))  MRSA PCR Screening     Status: None   Collection Time: 04/21/16 12:04 AM  Result Value Ref Range Status   MRSA by PCR NEGATIVE NEGATIVE Final    Comment:        The GeneXpert MRSA Assay (FDA approved for NASAL specimens only), is one component of a comprehensive MRSA colonization surveillance program. It is not intended to diagnose MRSA infection nor to guide or monitor treatment for MRSA infections.  Hospital Course:   This is a 75 years old female with history of multiple medical illlnesses was admitted due to slurred speech and generalized weakness. Her CT scan on admission was negative for acute lesion. Neurology consult was done and advised to do MRI but patient couldn't on the MRI machine due her size. Neurologist also suggested to continue ASA for a one month with other anticoagulant.  Patient improved. Her speech is better.She was also evaluated by speech therapist. Patient is stable discharge back to nursing home.  Discharge Exam: Blood pressure 120/66, pulse 95, temperature 97.9 F (36.6 C), temperature source Oral, resp. rate 17, height 5' (1.524 m), weight 124.9 kg (275 lb 5.7 oz), last menstrual period 03/31/2013, SpO2 100 %.   Disposition:  SNF      Signed: Exie Chrismer   04/23/2016, 8:21 AM

## 2016-04-23 NOTE — Clinical Social Work Note (Signed)
Pt d/c today back to [facility]. CSW attempted to discuss with pt, but pt opened eyes and then went back to sleep. Pt's daughter Rise Paganini and facility aware and agreeable. Will transport via [employer].   Benay Pike, [place]

## 2016-04-26 DIAGNOSIS — R4781 Slurred speech: Secondary | ICD-10-CM | POA: Diagnosis not present

## 2016-04-26 DIAGNOSIS — D649 Anemia, unspecified: Secondary | ICD-10-CM | POA: Diagnosis not present

## 2016-04-26 DIAGNOSIS — N939 Abnormal uterine and vaginal bleeding, unspecified: Secondary | ICD-10-CM | POA: Diagnosis not present

## 2016-04-26 DIAGNOSIS — F800 Phonological disorder: Secondary | ICD-10-CM | POA: Diagnosis not present

## 2016-04-26 DIAGNOSIS — I639 Cerebral infarction, unspecified: Secondary | ICD-10-CM | POA: Diagnosis not present

## 2016-04-26 DIAGNOSIS — I69354 Hemiplegia and hemiparesis following cerebral infarction affecting left non-dominant side: Secondary | ICD-10-CM | POA: Diagnosis not present

## 2016-04-26 DIAGNOSIS — M6281 Muscle weakness (generalized): Secondary | ICD-10-CM | POA: Diagnosis not present

## 2016-04-26 DIAGNOSIS — R278 Other lack of coordination: Secondary | ICD-10-CM | POA: Diagnosis not present

## 2016-04-26 DIAGNOSIS — I10 Essential (primary) hypertension: Secondary | ICD-10-CM | POA: Diagnosis not present

## 2016-04-27 DIAGNOSIS — F800 Phonological disorder: Secondary | ICD-10-CM | POA: Diagnosis not present

## 2016-04-27 DIAGNOSIS — I639 Cerebral infarction, unspecified: Secondary | ICD-10-CM | POA: Diagnosis not present

## 2016-04-27 DIAGNOSIS — M6281 Muscle weakness (generalized): Secondary | ICD-10-CM | POA: Diagnosis not present

## 2016-04-27 DIAGNOSIS — I69354 Hemiplegia and hemiparesis following cerebral infarction affecting left non-dominant side: Secondary | ICD-10-CM | POA: Diagnosis not present

## 2016-04-27 DIAGNOSIS — R278 Other lack of coordination: Secondary | ICD-10-CM | POA: Diagnosis not present

## 2016-04-28 DIAGNOSIS — M6281 Muscle weakness (generalized): Secondary | ICD-10-CM | POA: Diagnosis not present

## 2016-04-28 DIAGNOSIS — F800 Phonological disorder: Secondary | ICD-10-CM | POA: Diagnosis not present

## 2016-04-28 DIAGNOSIS — I69354 Hemiplegia and hemiparesis following cerebral infarction affecting left non-dominant side: Secondary | ICD-10-CM | POA: Diagnosis not present

## 2016-04-28 DIAGNOSIS — I639 Cerebral infarction, unspecified: Secondary | ICD-10-CM | POA: Diagnosis not present

## 2016-04-28 DIAGNOSIS — R278 Other lack of coordination: Secondary | ICD-10-CM | POA: Diagnosis not present

## 2016-04-29 DIAGNOSIS — F800 Phonological disorder: Secondary | ICD-10-CM | POA: Diagnosis not present

## 2016-04-29 DIAGNOSIS — M6281 Muscle weakness (generalized): Secondary | ICD-10-CM | POA: Diagnosis not present

## 2016-04-29 DIAGNOSIS — R278 Other lack of coordination: Secondary | ICD-10-CM | POA: Diagnosis not present

## 2016-04-29 DIAGNOSIS — I69354 Hemiplegia and hemiparesis following cerebral infarction affecting left non-dominant side: Secondary | ICD-10-CM | POA: Diagnosis not present

## 2016-04-29 DIAGNOSIS — I639 Cerebral infarction, unspecified: Secondary | ICD-10-CM | POA: Diagnosis not present

## 2016-04-30 DIAGNOSIS — I69354 Hemiplegia and hemiparesis following cerebral infarction affecting left non-dominant side: Secondary | ICD-10-CM | POA: Diagnosis not present

## 2016-04-30 DIAGNOSIS — M6281 Muscle weakness (generalized): Secondary | ICD-10-CM | POA: Diagnosis not present

## 2016-04-30 DIAGNOSIS — F800 Phonological disorder: Secondary | ICD-10-CM | POA: Diagnosis not present

## 2016-04-30 DIAGNOSIS — I639 Cerebral infarction, unspecified: Secondary | ICD-10-CM | POA: Diagnosis not present

## 2016-04-30 DIAGNOSIS — R278 Other lack of coordination: Secondary | ICD-10-CM | POA: Diagnosis not present

## 2016-05-03 DIAGNOSIS — M6281 Muscle weakness (generalized): Secondary | ICD-10-CM | POA: Diagnosis not present

## 2016-05-03 DIAGNOSIS — I69354 Hemiplegia and hemiparesis following cerebral infarction affecting left non-dominant side: Secondary | ICD-10-CM | POA: Diagnosis not present

## 2016-05-03 DIAGNOSIS — R278 Other lack of coordination: Secondary | ICD-10-CM | POA: Diagnosis not present

## 2016-05-03 DIAGNOSIS — I639 Cerebral infarction, unspecified: Secondary | ICD-10-CM | POA: Diagnosis not present

## 2016-05-03 DIAGNOSIS — F800 Phonological disorder: Secondary | ICD-10-CM | POA: Diagnosis not present

## 2016-05-04 DIAGNOSIS — I69354 Hemiplegia and hemiparesis following cerebral infarction affecting left non-dominant side: Secondary | ICD-10-CM | POA: Diagnosis not present

## 2016-05-04 DIAGNOSIS — R278 Other lack of coordination: Secondary | ICD-10-CM | POA: Diagnosis not present

## 2016-05-04 DIAGNOSIS — I639 Cerebral infarction, unspecified: Secondary | ICD-10-CM | POA: Diagnosis not present

## 2016-05-04 DIAGNOSIS — M6281 Muscle weakness (generalized): Secondary | ICD-10-CM | POA: Diagnosis not present

## 2016-05-04 DIAGNOSIS — F800 Phonological disorder: Secondary | ICD-10-CM | POA: Diagnosis not present

## 2016-05-05 DIAGNOSIS — F800 Phonological disorder: Secondary | ICD-10-CM | POA: Diagnosis not present

## 2016-05-05 DIAGNOSIS — R278 Other lack of coordination: Secondary | ICD-10-CM | POA: Diagnosis not present

## 2016-05-05 DIAGNOSIS — I69354 Hemiplegia and hemiparesis following cerebral infarction affecting left non-dominant side: Secondary | ICD-10-CM | POA: Diagnosis not present

## 2016-05-05 DIAGNOSIS — I639 Cerebral infarction, unspecified: Secondary | ICD-10-CM | POA: Diagnosis not present

## 2016-05-05 DIAGNOSIS — M6281 Muscle weakness (generalized): Secondary | ICD-10-CM | POA: Diagnosis not present

## 2016-05-06 DIAGNOSIS — I69354 Hemiplegia and hemiparesis following cerebral infarction affecting left non-dominant side: Secondary | ICD-10-CM | POA: Diagnosis not present

## 2016-05-06 DIAGNOSIS — I639 Cerebral infarction, unspecified: Secondary | ICD-10-CM | POA: Diagnosis not present

## 2016-05-06 DIAGNOSIS — F800 Phonological disorder: Secondary | ICD-10-CM | POA: Diagnosis not present

## 2016-05-06 DIAGNOSIS — R278 Other lack of coordination: Secondary | ICD-10-CM | POA: Diagnosis not present

## 2016-05-06 DIAGNOSIS — M6281 Muscle weakness (generalized): Secondary | ICD-10-CM | POA: Diagnosis not present

## 2016-05-07 DIAGNOSIS — I639 Cerebral infarction, unspecified: Secondary | ICD-10-CM | POA: Diagnosis not present

## 2016-05-07 DIAGNOSIS — M6281 Muscle weakness (generalized): Secondary | ICD-10-CM | POA: Diagnosis not present

## 2016-05-07 DIAGNOSIS — R278 Other lack of coordination: Secondary | ICD-10-CM | POA: Diagnosis not present

## 2016-05-07 DIAGNOSIS — I69354 Hemiplegia and hemiparesis following cerebral infarction affecting left non-dominant side: Secondary | ICD-10-CM | POA: Diagnosis not present

## 2016-05-07 DIAGNOSIS — F800 Phonological disorder: Secondary | ICD-10-CM | POA: Diagnosis not present

## 2016-05-10 DIAGNOSIS — F800 Phonological disorder: Secondary | ICD-10-CM | POA: Diagnosis not present

## 2016-05-10 DIAGNOSIS — R278 Other lack of coordination: Secondary | ICD-10-CM | POA: Diagnosis not present

## 2016-05-10 DIAGNOSIS — M6281 Muscle weakness (generalized): Secondary | ICD-10-CM | POA: Diagnosis not present

## 2016-05-10 DIAGNOSIS — I69354 Hemiplegia and hemiparesis following cerebral infarction affecting left non-dominant side: Secondary | ICD-10-CM | POA: Diagnosis not present

## 2016-05-10 DIAGNOSIS — I639 Cerebral infarction, unspecified: Secondary | ICD-10-CM | POA: Diagnosis not present

## 2016-05-11 DIAGNOSIS — M6281 Muscle weakness (generalized): Secondary | ICD-10-CM | POA: Diagnosis not present

## 2016-05-11 DIAGNOSIS — I69354 Hemiplegia and hemiparesis following cerebral infarction affecting left non-dominant side: Secondary | ICD-10-CM | POA: Diagnosis not present

## 2016-05-11 DIAGNOSIS — I639 Cerebral infarction, unspecified: Secondary | ICD-10-CM | POA: Diagnosis not present

## 2016-05-11 DIAGNOSIS — F800 Phonological disorder: Secondary | ICD-10-CM | POA: Diagnosis not present

## 2016-05-11 DIAGNOSIS — R278 Other lack of coordination: Secondary | ICD-10-CM | POA: Diagnosis not present

## 2016-05-12 DIAGNOSIS — R278 Other lack of coordination: Secondary | ICD-10-CM | POA: Diagnosis not present

## 2016-05-12 DIAGNOSIS — M6281 Muscle weakness (generalized): Secondary | ICD-10-CM | POA: Diagnosis not present

## 2016-05-12 DIAGNOSIS — F800 Phonological disorder: Secondary | ICD-10-CM | POA: Diagnosis not present

## 2016-05-12 DIAGNOSIS — I639 Cerebral infarction, unspecified: Secondary | ICD-10-CM | POA: Diagnosis not present

## 2016-05-12 DIAGNOSIS — I69354 Hemiplegia and hemiparesis following cerebral infarction affecting left non-dominant side: Secondary | ICD-10-CM | POA: Diagnosis not present

## 2016-05-13 DIAGNOSIS — I639 Cerebral infarction, unspecified: Secondary | ICD-10-CM | POA: Diagnosis not present

## 2016-05-13 DIAGNOSIS — Z79899 Other long term (current) drug therapy: Secondary | ICD-10-CM | POA: Diagnosis not present

## 2016-05-13 DIAGNOSIS — D649 Anemia, unspecified: Secondary | ICD-10-CM | POA: Diagnosis not present

## 2016-05-13 DIAGNOSIS — R319 Hematuria, unspecified: Secondary | ICD-10-CM | POA: Diagnosis not present

## 2016-05-13 DIAGNOSIS — I69354 Hemiplegia and hemiparesis following cerebral infarction affecting left non-dominant side: Secondary | ICD-10-CM | POA: Diagnosis not present

## 2016-05-13 DIAGNOSIS — N390 Urinary tract infection, site not specified: Secondary | ICD-10-CM | POA: Diagnosis not present

## 2016-05-13 DIAGNOSIS — N189 Chronic kidney disease, unspecified: Secondary | ICD-10-CM | POA: Diagnosis not present

## 2016-05-13 DIAGNOSIS — R278 Other lack of coordination: Secondary | ICD-10-CM | POA: Diagnosis not present

## 2016-05-13 DIAGNOSIS — F800 Phonological disorder: Secondary | ICD-10-CM | POA: Diagnosis not present

## 2016-05-13 DIAGNOSIS — M6281 Muscle weakness (generalized): Secondary | ICD-10-CM | POA: Diagnosis not present

## 2016-05-13 DIAGNOSIS — R4182 Altered mental status, unspecified: Secondary | ICD-10-CM | POA: Diagnosis not present

## 2016-05-14 DIAGNOSIS — N390 Urinary tract infection, site not specified: Secondary | ICD-10-CM | POA: Diagnosis not present

## 2016-05-14 DIAGNOSIS — F800 Phonological disorder: Secondary | ICD-10-CM | POA: Diagnosis not present

## 2016-05-14 DIAGNOSIS — R278 Other lack of coordination: Secondary | ICD-10-CM | POA: Diagnosis not present

## 2016-05-14 DIAGNOSIS — M6281 Muscle weakness (generalized): Secondary | ICD-10-CM | POA: Diagnosis not present

## 2016-05-14 DIAGNOSIS — I639 Cerebral infarction, unspecified: Secondary | ICD-10-CM | POA: Diagnosis not present

## 2016-05-14 DIAGNOSIS — I69354 Hemiplegia and hemiparesis following cerebral infarction affecting left non-dominant side: Secondary | ICD-10-CM | POA: Diagnosis not present

## 2016-05-17 DIAGNOSIS — R278 Other lack of coordination: Secondary | ICD-10-CM | POA: Diagnosis not present

## 2016-05-17 DIAGNOSIS — M6281 Muscle weakness (generalized): Secondary | ICD-10-CM | POA: Diagnosis not present

## 2016-05-17 DIAGNOSIS — I69354 Hemiplegia and hemiparesis following cerebral infarction affecting left non-dominant side: Secondary | ICD-10-CM | POA: Diagnosis not present

## 2016-05-17 DIAGNOSIS — I639 Cerebral infarction, unspecified: Secondary | ICD-10-CM | POA: Diagnosis not present

## 2016-05-17 DIAGNOSIS — F800 Phonological disorder: Secondary | ICD-10-CM | POA: Diagnosis not present

## 2016-05-18 DIAGNOSIS — R278 Other lack of coordination: Secondary | ICD-10-CM | POA: Diagnosis not present

## 2016-05-18 DIAGNOSIS — F800 Phonological disorder: Secondary | ICD-10-CM | POA: Diagnosis not present

## 2016-05-18 DIAGNOSIS — I639 Cerebral infarction, unspecified: Secondary | ICD-10-CM | POA: Diagnosis not present

## 2016-05-18 DIAGNOSIS — M6281 Muscle weakness (generalized): Secondary | ICD-10-CM | POA: Diagnosis not present

## 2016-05-18 DIAGNOSIS — I69354 Hemiplegia and hemiparesis following cerebral infarction affecting left non-dominant side: Secondary | ICD-10-CM | POA: Diagnosis not present

## 2016-05-19 DIAGNOSIS — F800 Phonological disorder: Secondary | ICD-10-CM | POA: Diagnosis not present

## 2016-05-19 DIAGNOSIS — I639 Cerebral infarction, unspecified: Secondary | ICD-10-CM | POA: Diagnosis not present

## 2016-05-19 DIAGNOSIS — R278 Other lack of coordination: Secondary | ICD-10-CM | POA: Diagnosis not present

## 2016-05-19 DIAGNOSIS — I69354 Hemiplegia and hemiparesis following cerebral infarction affecting left non-dominant side: Secondary | ICD-10-CM | POA: Diagnosis not present

## 2016-05-19 DIAGNOSIS — M6281 Muscle weakness (generalized): Secondary | ICD-10-CM | POA: Diagnosis not present

## 2016-05-20 DIAGNOSIS — M6281 Muscle weakness (generalized): Secondary | ICD-10-CM | POA: Diagnosis not present

## 2016-05-20 DIAGNOSIS — R278 Other lack of coordination: Secondary | ICD-10-CM | POA: Diagnosis not present

## 2016-05-20 DIAGNOSIS — I69354 Hemiplegia and hemiparesis following cerebral infarction affecting left non-dominant side: Secondary | ICD-10-CM | POA: Diagnosis not present

## 2016-05-20 DIAGNOSIS — F800 Phonological disorder: Secondary | ICD-10-CM | POA: Diagnosis not present

## 2016-05-20 DIAGNOSIS — I639 Cerebral infarction, unspecified: Secondary | ICD-10-CM | POA: Diagnosis not present

## 2016-05-21 ENCOUNTER — Ambulatory Visit: Payer: Commercial Managed Care - HMO | Admitting: Cardiology

## 2016-05-21 DIAGNOSIS — F800 Phonological disorder: Secondary | ICD-10-CM | POA: Diagnosis not present

## 2016-05-21 DIAGNOSIS — I639 Cerebral infarction, unspecified: Secondary | ICD-10-CM | POA: Diagnosis not present

## 2016-05-21 DIAGNOSIS — I69354 Hemiplegia and hemiparesis following cerebral infarction affecting left non-dominant side: Secondary | ICD-10-CM | POA: Diagnosis not present

## 2016-05-21 DIAGNOSIS — M6281 Muscle weakness (generalized): Secondary | ICD-10-CM | POA: Diagnosis not present

## 2016-05-21 DIAGNOSIS — R278 Other lack of coordination: Secondary | ICD-10-CM | POA: Diagnosis not present

## 2016-05-22 DIAGNOSIS — I69354 Hemiplegia and hemiparesis following cerebral infarction affecting left non-dominant side: Secondary | ICD-10-CM | POA: Diagnosis not present

## 2016-05-22 DIAGNOSIS — F800 Phonological disorder: Secondary | ICD-10-CM | POA: Diagnosis not present

## 2016-05-22 DIAGNOSIS — I639 Cerebral infarction, unspecified: Secondary | ICD-10-CM | POA: Diagnosis not present

## 2016-05-22 DIAGNOSIS — R278 Other lack of coordination: Secondary | ICD-10-CM | POA: Diagnosis not present

## 2016-05-22 DIAGNOSIS — M6281 Muscle weakness (generalized): Secondary | ICD-10-CM | POA: Diagnosis not present

## 2016-05-24 DIAGNOSIS — I69354 Hemiplegia and hemiparesis following cerebral infarction affecting left non-dominant side: Secondary | ICD-10-CM | POA: Diagnosis not present

## 2016-05-24 DIAGNOSIS — I639 Cerebral infarction, unspecified: Secondary | ICD-10-CM | POA: Diagnosis not present

## 2016-05-24 DIAGNOSIS — F800 Phonological disorder: Secondary | ICD-10-CM | POA: Diagnosis not present

## 2016-05-24 DIAGNOSIS — R278 Other lack of coordination: Secondary | ICD-10-CM | POA: Diagnosis not present

## 2016-05-24 DIAGNOSIS — M6281 Muscle weakness (generalized): Secondary | ICD-10-CM | POA: Diagnosis not present

## 2016-05-25 DIAGNOSIS — R278 Other lack of coordination: Secondary | ICD-10-CM | POA: Diagnosis not present

## 2016-05-25 DIAGNOSIS — M6281 Muscle weakness (generalized): Secondary | ICD-10-CM | POA: Diagnosis not present

## 2016-05-25 DIAGNOSIS — F800 Phonological disorder: Secondary | ICD-10-CM | POA: Diagnosis not present

## 2016-05-25 DIAGNOSIS — I69354 Hemiplegia and hemiparesis following cerebral infarction affecting left non-dominant side: Secondary | ICD-10-CM | POA: Diagnosis not present

## 2016-05-25 DIAGNOSIS — I639 Cerebral infarction, unspecified: Secondary | ICD-10-CM | POA: Diagnosis not present

## 2016-05-26 ENCOUNTER — Ambulatory Visit (INDEPENDENT_AMBULATORY_CARE_PROVIDER_SITE_OTHER): Payer: Commercial Managed Care - HMO | Admitting: Cardiology

## 2016-05-26 ENCOUNTER — Encounter: Payer: Self-pay | Admitting: Cardiology

## 2016-05-26 VITALS — BP 110/68 | HR 70 | Ht 60.0 in | Wt 263.0 lb

## 2016-05-26 DIAGNOSIS — R278 Other lack of coordination: Secondary | ICD-10-CM | POA: Diagnosis not present

## 2016-05-26 DIAGNOSIS — E785 Hyperlipidemia, unspecified: Secondary | ICD-10-CM | POA: Diagnosis not present

## 2016-05-26 DIAGNOSIS — I4891 Unspecified atrial fibrillation: Secondary | ICD-10-CM | POA: Diagnosis not present

## 2016-05-26 DIAGNOSIS — I69354 Hemiplegia and hemiparesis following cerebral infarction affecting left non-dominant side: Secondary | ICD-10-CM | POA: Diagnosis not present

## 2016-05-26 DIAGNOSIS — I10 Essential (primary) hypertension: Secondary | ICD-10-CM | POA: Diagnosis not present

## 2016-05-26 DIAGNOSIS — M6281 Muscle weakness (generalized): Secondary | ICD-10-CM | POA: Diagnosis not present

## 2016-05-26 DIAGNOSIS — Z8673 Personal history of transient ischemic attack (TIA), and cerebral infarction without residual deficits: Secondary | ICD-10-CM | POA: Diagnosis not present

## 2016-05-26 DIAGNOSIS — F800 Phonological disorder: Secondary | ICD-10-CM | POA: Diagnosis not present

## 2016-05-26 DIAGNOSIS — I639 Cerebral infarction, unspecified: Secondary | ICD-10-CM | POA: Diagnosis not present

## 2016-05-26 NOTE — Patient Instructions (Signed)
Your physician wants you to follow-up in: 6 months You will receive a reminder letter in the mail two months in advance. If you don't receive a letter, please call our office to schedule the follow-up appointment.     Your physician recommends that you continue on your current medications as directed. Please refer to the Current Medication list given to you today.      Thank you for choosing [facility] !        

## 2016-05-26 NOTE — Progress Notes (Signed)
Clinical Summary [name] is a 75 y.o.female seen today for follow up of the following medical problems.   1. Afib  - no recent palpitations - compliant with meds.   2. Hx of CVA  - admit 07/2014 with left leg weakness, found to have acute CVA right anterior cerebral artery. Concern for cardioemblic CVA. History of afib, was not on anticoag at that time - since has been on eliquis, compliant. Denies any recent bleeding issues - episode of dysarthria 04/2016 with hospital admission, followed by neuro 3. HTN  - compliant with meds - checks bp at home daily, typically around 110s/90s  4. Hyperlipidemia - 04/2016 TC 144 TG 40 HDL 39 LDL 97 - compliant with statin  Past Medical History  Diagnosis Date  . Diabetes mellitus   . Asthma   . Chronic knee pain   . Back pain, chronic   . Hypertension   . Vertigo   . Hyperlipidemia   . TIA (transient ischemic attack)   . Anemia   . HOH (hard of hearing)   . Arthritis   . Obesity   . PMB (postmenopausal bleeding) 11/26/2014  . Urge incontinence 11/26/2014  . Thickened endometrium 01/15/2015  . Dysrhythmia   . Stroke [facility])     Mini Stroke     Allergies  Allergen Reactions  . Imitrex [Sumatriptan] Other (See Comments)    Unknown Reaction   . Mango Flavor     Nausea and vomiting  . Oysters [Shellfish Allergy]     Nausea and vomiting     Current Outpatient Prescriptions  Medication Sig Dispense Refill  . acetaminophen (TYLENOL) 325 MG tablet Take 650 mg by mouth every 6 (six) hours as needed for mild pain.     Marland Kitchen atorvastatin (LIPITOR) 20 MG tablet Take 80 mg by mouth at bedtime.     . bisacodyl (DULCOLAX) 5 MG EC tablet Take 5 mg by mouth daily as needed for moderate constipation.    . cholecalciferol (VITAMIN D) 1000 UNITS tablet Take 1 tablet (1,000 Units total) by mouth every morning. 30 tablet 1  . colchicine 0.6 MG tablet Take 0.3 mg by mouth 2 (two) times daily.    Marland Kitchen diltiazem (CARDIZEM CD) 180 MG 24 hr  capsule Take 1 capsule (180 mg total) by mouth daily. 90 capsule 3  . ELIQUIS 5 MG TABS tablet Take 5 mg by mouth 2 (two) times daily.    . furosemide (LASIX) 20 MG tablet Take 20 mg by mouth 2 (two) times daily.     Marland Kitchen gabapentin (NEURONTIN) 100 MG capsule Take 100 mg by mouth 3 (three) times daily.     Marland Kitchen guaiFENesin (ROBITUSSIN) 100 MG/5ML liquid Take 100 mg by mouth every 4 (four) hours.    Marland Kitchen ipratropium-albuterol (DUONEB) 0.5-2.5 (3) MG/3ML SOLN Inhale 3 mLs into the lungs every 6 (six) hours as needed.     Marland Kitchen KLOR-CON M20 20 MEQ tablet Take 20 mEq by mouth daily.     Marland Kitchen LINZESS 290 MCG CAPS capsule Take 290 mcg by mouth daily before breakfast.     . megestrol (MEGACE) 40 MG tablet Take 40 mg by mouth 3 (three) times daily.    Marland Kitchen oxybutynin (DITROPAN-XL) 5 MG 24 hr tablet Take 5 mg by mouth daily.     . risperiDONE (RISPERDAL) 0.5 MG tablet Take 0.5 mg by mouth 2 (two) times daily.     . TRADJENTA 5 MG TABS tablet Take 5 mg by mouth  daily.   0  . traMADol (ULTRAM) 50 MG tablet Take 50 mg by mouth daily. *Also takes every 6 hours as needed for pain    . VENTOLIN HFA 108 (90 BASE) MCG/ACT inhaler Inhale 2 puffs into the lungs every 4 (four) hours as needed for wheezing or shortness of breath.      No current facility-administered medications for this visit.     Past Surgical History  Procedure Laterality Date  . Appendectomy    . Ovary surgery    . Cataract extraction w/phaco Left 01/16/2013    Procedure: CATARACT EXTRACTION PHACO AND INTRAOCULAR LENS PLACEMENT (IOC);  Surgeon: Elta Guadeloupe T. Gershon Crane, MD;  Location: [facility];  Service: Ophthalmology;  Laterality: Left;  CDE:14.37  . Endometrial biopsy  04/03/2013       . Hysteroscopy w/d&c N/A 04/24/2013    Procedure: SUCTION DILATATION AND CURETTAGE /HYSTEROSCOPY;  Surgeon: Jonnie Kind, MD;  Location: [facility];  Service: Gynecology;  Laterality: N/A;  . Knee arthroscopy with medial menisectomy Left 07/26/2013    Procedure: KNEE ARTHROSCOPY WITH  PARTIAL MEDIAL MENISECTOMY;  Surgeon: Sanjuana Kava, MD;  Location: [facility];  Service: Orthopedics;  Laterality: Left;  . Endometrial ablation    . Colonoscopy  2009    Dr. Wilford Corner: 4 hyperplastic polyps removed. Small internal hemorrhoids. Recommended 5 year follow-up colonoscopy.  . Colonoscopy N/A 02/24/2015    Procedure: COLONOSCOPY;  Surgeon: Danie Binder, MD;  Location: [facility];  Service: Endoscopy;  Laterality: N/A;  1030  . Hysteroscopy w/d&c N/A 04/02/2015    Procedure: UTERINE CURETTAGE, HYSTEROSCOPY;  Surgeon: Florian Buff, MD;  Location: [facility];  Service: Gynecology;  Laterality: N/A;     Allergies  Allergen Reactions  . Imitrex [Sumatriptan] Other (See Comments)    Unknown Reaction   . Mango Flavor     Nausea and vomiting  . Oysters [Shellfish Allergy]     Nausea and vomiting      Family History  Problem Relation Age of Onset  . Arrhythmia Father     Atrial fib/Pacemaker  . Heart failure Mother   . Diabetes Mother   . Other Mother     fell and broke hip  . Diabetes Sister   . Lupus Daughter   . Mental illness Son   . ADD / ADHD Son   . Other Son     soft bones  . Diabetes Maternal Grandmother   . Stroke Paternal Grandmother   . Arthritis Paternal Grandfather   . Other Sister     MVA  . Hypertension Brother   . Diabetes Sister   . Hypertension Sister   . Colon cancer Neg Hx      Social History Ms. Szwed reports that she quit smoking about a year ago. Her smoking use included Cigarettes. She has a 7.5 pack-year smoking history. She has never used smokeless tobacco. Ms. Vicars reports that she does not drink alcohol.   Review of Systems CONSTITUTIONAL: No weight loss, fever, chills, weakness or fatigue.  HEENT: Eyes: No visual loss, blurred vision, double vision or yellow sclerae.No hearing loss, sneezing, congestion, runny nose or sore throat.  SKIN: No rash or itching.  CARDIOVASCULAR: per HPI RESPIRATORY: No shortness of  breath, cough or sputum.  GASTROINTESTINAL: No anorexia, nausea, vomiting or diarrhea. No abdominal pain or blood.  GENITOURINARY: No burning on urination, no polyuria NEUROLOGICAL: No headache, dizziness, syncope, paralysis, ataxia, numbness or tingling in the extremities. No change in bowel or  bladder control.  MUSCULOSKELETAL: No muscle, back pain, joint pain or stiffness.  LYMPHATICS: No enlarged nodes. No history of splenectomy.  PSYCHIATRIC: No history of depression or anxiety.  ENDOCRINOLOGIC: No reports of sweating, cold or heat intolerance. No polyuria or polydipsia.  Marland Kitchen   Physical Examination Filed Vitals:   05/26/16 1353  BP: 110/68  Pulse: 70   Filed Vitals:   05/26/16 1353  Height: 5' (1.524 m)  Weight: 263 lb (119.296 kg)    Gen: resting comfortably, no acute distress HEENT: no scleral icterus, pupils equal round and reactive, no palptable cervical adenopathy,  CV: RRR, no m/r/g, no jvd Resp: Clear to auscultation bilaterally GI: abdomen is soft, non-tender, non-distended, normal bowel sounds, no hepatosplenomegaly MSK: extremities are warm, no edema.  Skin: warm, no rash Neuro:  no focal deficits Psych: appropriate affect   Diagnostic Studies 07/2014 Echo Study Conclusions  - Left ventricle: The cavity size was normal. Systolic function was normal. The estimated ejection fraction was in the range of 55% to 60%. Wall motion was normal; there were no regional wall motion abnormalities. Doppler parameters are consistent with abnormal left ventricular relaxation (grade 1 diastolic dysfunction).    Assessment and Plan  1. Afib - no current symptoms - continue current meds. CHADS2Vasc score is 6, continue anticoag  2. Hx of CVA - no residual deficits.  We will continue eliquis in setting of afib  3. HTN - at goal, continue current meds - if additional agent needed, consider ACE-I given her history of prior CVA and pre-diabetes  4. Hyperlipidemia -  at goal, continue statin     F/u 6 months Arnoldo Lenis, M.D..

## 2016-05-27 ENCOUNTER — Encounter (HOSPITAL_COMMUNITY): Payer: Self-pay | Admitting: Emergency Medicine

## 2016-05-27 ENCOUNTER — Other Ambulatory Visit: Payer: Self-pay

## 2016-05-27 ENCOUNTER — Emergency Department (HOSPITAL_COMMUNITY): Payer: Commercial Managed Care - HMO

## 2016-05-27 ENCOUNTER — Emergency Department (HOSPITAL_COMMUNITY)
Admission: EM | Admit: 2016-05-27 | Discharge: 2016-05-27 | Disposition: A | Discharge status: Home / Self Care | Payer: Commercial Managed Care - HMO | Attending: Emergency Medicine | Admitting: Emergency Medicine

## 2016-05-27 DIAGNOSIS — R55 Syncope and collapse: Secondary | ICD-10-CM | POA: Diagnosis not present

## 2016-05-27 DIAGNOSIS — I69354 Hemiplegia and hemiparesis following cerebral infarction affecting left non-dominant side: Secondary | ICD-10-CM | POA: Diagnosis not present

## 2016-05-27 DIAGNOSIS — Z79899 Other long term (current) drug therapy: Secondary | ICD-10-CM | POA: Diagnosis not present

## 2016-05-27 DIAGNOSIS — E119 Type 2 diabetes mellitus without complications: Secondary | ICD-10-CM | POA: Diagnosis not present

## 2016-05-27 DIAGNOSIS — J45909 Unspecified asthma, uncomplicated: Secondary | ICD-10-CM | POA: Diagnosis not present

## 2016-05-27 DIAGNOSIS — M1990 Unspecified osteoarthritis, unspecified site: Secondary | ICD-10-CM | POA: Insufficient documentation

## 2016-05-27 DIAGNOSIS — R4781 Slurred speech: Secondary | ICD-10-CM

## 2016-05-27 DIAGNOSIS — Z743 Need for continuous supervision: Secondary | ICD-10-CM | POA: Diagnosis not present

## 2016-05-27 DIAGNOSIS — R4182 Altered mental status, unspecified: Secondary | ICD-10-CM | POA: Diagnosis present

## 2016-05-27 DIAGNOSIS — E785 Hyperlipidemia, unspecified: Secondary | ICD-10-CM | POA: Diagnosis not present

## 2016-05-27 DIAGNOSIS — M6281 Muscle weakness (generalized): Secondary | ICD-10-CM | POA: Diagnosis not present

## 2016-05-27 DIAGNOSIS — R531 Weakness: Secondary | ICD-10-CM | POA: Diagnosis not present

## 2016-05-27 DIAGNOSIS — I10 Essential (primary) hypertension: Secondary | ICD-10-CM | POA: Insufficient documentation

## 2016-05-27 DIAGNOSIS — R404 Transient alteration of awareness: Secondary | ICD-10-CM | POA: Diagnosis not present

## 2016-05-27 DIAGNOSIS — Z6841 Body Mass Index (BMI) 40.0 and over, adult: Secondary | ICD-10-CM | POA: Insufficient documentation

## 2016-05-27 DIAGNOSIS — E669 Obesity, unspecified: Secondary | ICD-10-CM | POA: Diagnosis not present

## 2016-05-27 DIAGNOSIS — F800 Phonological disorder: Secondary | ICD-10-CM | POA: Diagnosis not present

## 2016-05-27 DIAGNOSIS — Z87891 Personal history of nicotine dependence: Secondary | ICD-10-CM | POA: Diagnosis not present

## 2016-05-27 DIAGNOSIS — R278 Other lack of coordination: Secondary | ICD-10-CM | POA: Diagnosis not present

## 2016-05-27 DIAGNOSIS — I639 Cerebral infarction, unspecified: Secondary | ICD-10-CM | POA: Diagnosis not present

## 2016-05-27 DIAGNOSIS — R279 Unspecified lack of coordination: Secondary | ICD-10-CM | POA: Diagnosis not present

## 2016-05-27 LAB — CBC WITH DIFFERENTIAL/PLATELET
Basophils Absolute: 0 10*3/uL (ref 0.0–0.1)
Basophils Relative: 0 %
Eosinophils Absolute: 0 10*3/uL (ref 0.0–0.7)
Eosinophils Relative: 1 %
HCT: 42.7 % (ref 36.0–46.0)
Hemoglobin: 14.6 g/dL (ref 12.0–15.0)
Lymphocytes Relative: 30 %
Lymphs Abs: 1.3 10*3/uL (ref 0.7–4.0)
MCH: 34.2 pg — ABNORMAL HIGH (ref 26.0–34.0)
MCHC: 34.2 g/dL (ref 30.0–36.0)
MCV: 100 fL (ref 78.0–100.0)
Monocytes Absolute: 0.5 10*3/uL (ref 0.1–1.0)
Monocytes Relative: 13 %
Neutro Abs: 2.3 10*3/uL (ref 1.7–7.7)
Neutrophils Relative %: 56 %
Platelets: 203 10*3/uL (ref 150–400)
RBC: 4.27 MIL/uL (ref 3.87–5.11)
RDW: 12.6 % (ref 11.5–15.5)
WBC: 4.1 10*3/uL (ref 4.0–10.5)

## 2016-05-27 LAB — BASIC METABOLIC PANEL
Anion gap: 6 (ref 5–15)
BUN: 14 mg/dL (ref 6–20)
CO2: 27 mmol/L (ref 22–32)
Calcium: 8.9 mg/dL (ref 8.9–10.3)
Chloride: 103 mmol/L (ref 101–111)
Creatinine, Ser: 1.07 mg/dL — ABNORMAL HIGH (ref 0.44–1.00)
GFR calc Af Amer: 58 mL/min — ABNORMAL LOW (ref >60)
GFR calc non Af Amer: 50 mL/min — ABNORMAL LOW (ref >60)
Glucose, Bld: 87 mg/dL (ref 65–99)
Potassium: 4.2 mmol/L (ref 3.5–5.1)
Sodium: 136 mmol/L (ref 135–145)

## 2016-05-27 LAB — TROPONIN I: Troponin I: <0.03 ng/mL (ref <0.031)

## 2016-05-27 NOTE — ED Notes (Signed)
Pt sent to ED from [place] for altered mental status. Pt LSN at 0930 this am. NT reported to RN at [facility] that pt was "not acting right" upon entering room at approximately 1410 pm this afternoon. [facility] RN reports patient had a fixed gaze and was not verbally responding or following commands for approximately 5 minutes.   Upon ED arrival pt alert and oriented at baseline, following commands appropriately. Denies pain. Pt states she remembers being unable to respond to staff at [facility].  Pt has history of left hemiparesis from previous stroke. Pt currently on Eliquis.

## 2016-05-27 NOTE — ED Notes (Signed)
[name], daughter.  [phone] cell and [phone] home. Please notify when results are in. Meagan RN aware

## 2016-05-27 NOTE — ED Notes (Signed)
Spoke with [name], pt's daughter, about test findings and latisha at [facility] related to discharge instructions.

## 2016-05-27 NOTE — ED Provider Notes (Signed)
CSN: [id number]     Arrival date & time 05/27/16  1450 History   First MD Initiated Contact with Patient 05/27/16 1521     Chief Complaint  Patient presents with  . Altered Mental Status     (Consider location/radiation/quality/duration/timing/severity/associated sxs/prior Treatment) HPI   75 year old female coming from [place] today for evaluation of altered mental status. Per report, patient was "not acting right." First noticed around 2:10 PM. Unsure when she was last at her baseline prior to this. Patient apparently appeared to be awake was not responding verbally to questions. This lasted about 5 minutes and improved. Patient is aware of these events and agrees. She states that she just could not get her words out. She has had a prior stroke with left hemiparesis. She denies new neuro complaints aside from what happened earlier today. Recent admission and workup for stroke. She is on Eliquis.  Past Medical History  Diagnosis Date  . Diabetes mellitus   . Asthma   . Chronic knee pain   . Back pain, chronic   . Hypertension   . Vertigo   . Hyperlipidemia   . TIA (transient ischemic attack)   . Anemia   . HOH (hard of hearing)   . Arthritis   . Obesity   . PMB (postmenopausal bleeding) 11/26/2014  . Urge incontinence 11/26/2014  . Thickened endometrium 01/15/2015  . Dysrhythmia   . Stroke [facility])     Mini Stroke   Past Surgical History  Procedure Laterality Date  . Appendectomy    . Ovary surgery    . Cataract extraction w/phaco Left 01/16/2013    Procedure: CATARACT EXTRACTION PHACO AND INTRAOCULAR LENS PLACEMENT (IOC);  Surgeon: Elta Guadeloupe T. Gershon Crane, MD;  Location: [facility];  Service: Ophthalmology;  Laterality: Left;  CDE:14.37  . Endometrial biopsy  04/03/2013       . Hysteroscopy w/d&c N/A 04/24/2013    Procedure: SUCTION DILATATION AND CURETTAGE /HYSTEROSCOPY;  Surgeon: Jonnie Kind, MD;  Location: [facility];  Service: Gynecology;  Laterality: N/A;  . Knee arthroscopy with  medial menisectomy Left 07/26/2013    Procedure: KNEE ARTHROSCOPY WITH PARTIAL MEDIAL MENISECTOMY;  Surgeon: Sanjuana Kava, MD;  Location: [facility];  Service: Orthopedics;  Laterality: Left;  . Endometrial ablation    . Colonoscopy  2009    Dr. Wilford Corner: 4 hyperplastic polyps removed. Small internal hemorrhoids. Recommended 5 year follow-up colonoscopy.  . Colonoscopy N/A 02/24/2015    Procedure: COLONOSCOPY;  Surgeon: Danie Binder, MD;  [facility];  Service: Endoscopy;  Laterality: N/A;  1030  . Hysteroscopy w/d&c N/A 04/02/2015    Procedure: UTERINE CURETTAGE, HYSTEROSCOPY;  Surgeon: Florian Buff, MD;  Location: [facility];  Service: Gynecology;  Laterality: N/A;   Family History  Problem Relation Age of Onset  . Arrhythmia Father     Atrial fib/Pacemaker  . Heart failure Mother   . Diabetes Mother   . Other Mother     fell and broke hip  . Diabetes Sister   . Lupus Daughter   . Mental illness Son   . ADD / ADHD Son   . Other Son     soft bones  . Diabetes Maternal Grandmother   . Stroke Paternal Grandmother   . Arthritis Paternal Grandfather   . Other Sister     MVA  . Hypertension Brother   . Diabetes Sister   . Hypertension Sister   . Colon cancer Neg Hx    Social History  Substance Use Topics  . Smoking status: Former Smoker -- 0.25 packs/day for 30 years    Types: Cigarettes    Quit date: 05/20/2015  . Smokeless tobacco: Never Used  . Alcohol Use: No   OB History    Gravida Para Term Preterm AB TAB SAB Ectopic Multiple Living   '2 2        2     '$ Review of Systems  All systems reviewed and negative, other than as noted in HPI.   Allergies  Imitrex; Mango flavor; and Oysters  Home Medications   Prior to Admission medications   Medication Sig Start Date End Date Taking? Authorizing Provider  acetaminophen (TYLENOL) 325 MG tablet Take 650 mg by mouth every 6 (six) hours as needed for mild pain.    Yes Historical Provider, MD  atorvastatin  (LIPITOR) 20 MG tablet Take 80 mg by mouth at bedtime.    Yes Historical Provider, MD  bisacodyl (DULCOLAX) 5 MG EC tablet Take 5 mg by mouth daily as needed for moderate constipation.   Yes Historical Provider, MD  cholecalciferol (VITAMIN D) 1000 UNITS tablet Take 1 tablet (1,000 Units total) by mouth every morning. 05/21/15  Yes Daniel J Angiulli, PA-C  colchicine 0.6 MG tablet Take 0.3 mg by mouth 2 (two) times daily. 03/30/16  Yes Historical Provider, MD  diltiazem (CARDIZEM CD) 180 MG 24 hr capsule Take 1 capsule (180 mg total) by mouth daily. 09/19/15  Yes Arnoldo Lenis, MD  ELIQUIS 5 MG TABS tablet Take 5 mg by mouth 2 (two) times daily. 04/18/16  Yes Historical Provider, MD  furosemide (LASIX) 20 MG tablet Take 20 mg by mouth 2 (two) times daily.  07/15/15  Yes Historical Provider, MD  gabapentin (NEURONTIN) 100 MG capsule Take 100 mg by mouth 3 (three) times daily.  07/09/15  Yes Historical Provider, MD  guaiFENesin (ROBITUSSIN) 100 MG/5ML liquid Take 100 mg by mouth every 4 (four) hours.   Yes Historical Provider, MD  ipratropium-albuterol (DUONEB) 0.5-2.5 (3) MG/3ML SOLN Inhale 3 mLs into the lungs every 6 (six) hours as needed.  10/24/15  Yes Historical Provider, MD  KLOR-CON M20 20 MEQ tablet Take 20 mEq by mouth 2 (two) times daily.  07/18/15  Yes Historical Provider, MD  LINZESS 290 MCG CAPS capsule Take 290 mcg by mouth daily before breakfast.  01/27/16  Yes Historical Provider, MD  megestrol (MEGACE) 20 MG tablet Take 20 mg by mouth 3 (three) times daily.   Yes Historical Provider, MD  oxybutynin (DITROPAN-XL) 5 MG 24 hr tablet Take 5 mg by mouth daily.  09/15/15  Yes Historical Provider, MD  risperiDONE (RISPERDAL) 0.5 MG tablet Take 0.5 mg by mouth 2 (two) times daily.  01/27/16  Yes Historical Provider, MD  TRADJENTA 5 MG TABS tablet Take 5 mg by mouth daily.  02/26/15  Yes Historical Provider, MD  traMADol (ULTRAM) 50 MG tablet Take 50 mg by mouth every 6 (six) hours as needed for  moderate pain. *Also takes every 6 hours as needed for pain   Yes Historical Provider, MD  VENTOLIN HFA 108 (90 BASE) MCG/ACT inhaler Inhale 2 puffs into the lungs every 4 (four) hours as needed for wheezing or shortness of breath.  07/05/15  Yes Historical Provider, MD   BP 121/91 mmHg  Pulse 103  Temp(Src) 98.6 F (37 C) (Oral)  Resp 24  Ht 5' (1.524 m)  Wt 260 lb (117.935 kg)  BMI 50.78 kg/m2  SpO2 100%  LMP  03/31/2013 Physical Exam  Constitutional: She appears well-developed and well-nourished. No distress.  HENT:  Head: Normocephalic and atraumatic.  Eyes: Conjunctivae are normal. Right eye exhibits no discharge. Left eye exhibits no discharge.  Neck: Neck supple.  Cardiovascular: Normal rate, regular rhythm and normal heart sounds.  Exam reveals no gallop and no friction rub.   No murmur heard. Pulmonary/Chest: Effort normal and breath sounds normal. No respiratory distress.  Abdominal: Soft. She exhibits no distension. There is no tenderness.  Musculoskeletal: She exhibits no edema or tenderness.  Neurological: She is alert.  Speech is slow, but understandable. Left-sided hemiparesis.  Skin: Skin is warm and dry.  Psychiatric: She has a normal mood and affect. Her behavior is normal. Thought content normal.  Nursing note and vitals reviewed.   ED Course  Procedures (including critical care time) Labs Review Labs Reviewed  CBC WITH DIFFERENTIAL/PLATELET - Abnormal; Notable for the following:    MCH 34.2 (*)    All other components within normal limits  BASIC METABOLIC PANEL - Abnormal; Notable for the following:    Creatinine, Ser 1.07 (*)    GFR calc non Af Amer 50 (*)    GFR calc Af Amer 58 (*)    All other components within normal limits  TROPONIN I    Imaging Review Ct Head Wo Contrast  05/27/2016  CLINICAL DATA:  Unresponsive to staff at nursing home. Left hemiparesis from previous stroke. EXAM: CT HEAD WITHOUT CONTRAST TECHNIQUE: Contiguous axial images were  obtained from the base of the skull through the vertex without intravenous contrast. COMPARISON:  04/20/2016 FINDINGS: Ventricles and cisterns are within normal. There is moderate chronic ischemic microvascular disease. There is evidence an old right MCA infarct unchanged. There is no evidence of mass, mass effect, shift of midline structures or acute hemorrhage. No evidence of acute infarction. Mild opacification over the frontal and ethmoid sinuses. IMPRESSION: No acute intracranial findings. Chronic ischemic microvascular disease and old right MCA infarct. Minimal chronic sinus inflammatory disease. Electronically Signed   By: Marin Olp M.D.   On: 05/27/2016 19:02   I have personally reviewed and evaluated these images and lab results as part of my medical decision-making.   EKG Interpretation   Date/Time:  Thursday May 27 2016 15:07:16 EDT Ventricular Rate:  111 PR Interval:    QRS Duration: 90 QT Interval:  337 QTC Calculation: 458 R Axis:   42 Text Interpretation:  Atrial fibrillation Baseline wander in lead(s) V1  History of atrial fibrillation on 04/20/2016 No acute changes Confirmed by  [facility] MD, Hinton Dyer [phone]) on 05/27/2016 3:13:14 PM      MDM   Final diagnoses:  Slurred speech    75 year old female with dysarthria which has since resolved. She recently was evaluated for the same complaint. Currently on Eliquis. The head does not show any acute abnormality. I do not feel that bring her in for further evaluation with her recent testing so recently is much utility. Given that she is back to her previous baseline and that she can appropriately be discharged. Outpatient neurology follow-up at this point.    Virgel Manifold, MD 06/02/16 [phone]

## 2016-05-28 DIAGNOSIS — F800 Phonological disorder: Secondary | ICD-10-CM | POA: Diagnosis not present

## 2016-05-28 DIAGNOSIS — I69354 Hemiplegia and hemiparesis following cerebral infarction affecting left non-dominant side: Secondary | ICD-10-CM | POA: Diagnosis not present

## 2016-05-28 DIAGNOSIS — I639 Cerebral infarction, unspecified: Secondary | ICD-10-CM | POA: Diagnosis not present

## 2016-05-28 DIAGNOSIS — M6281 Muscle weakness (generalized): Secondary | ICD-10-CM | POA: Diagnosis not present

## 2016-05-28 DIAGNOSIS — R278 Other lack of coordination: Secondary | ICD-10-CM | POA: Diagnosis not present

## 2016-05-31 DIAGNOSIS — I639 Cerebral infarction, unspecified: Secondary | ICD-10-CM | POA: Diagnosis not present

## 2016-05-31 DIAGNOSIS — I69354 Hemiplegia and hemiparesis following cerebral infarction affecting left non-dominant side: Secondary | ICD-10-CM | POA: Diagnosis not present

## 2016-05-31 DIAGNOSIS — M6281 Muscle weakness (generalized): Secondary | ICD-10-CM | POA: Diagnosis not present

## 2016-05-31 DIAGNOSIS — F800 Phonological disorder: Secondary | ICD-10-CM | POA: Diagnosis not present

## 2016-05-31 DIAGNOSIS — R278 Other lack of coordination: Secondary | ICD-10-CM | POA: Diagnosis not present

## 2016-06-01 DIAGNOSIS — L84 Corns and callosities: Secondary | ICD-10-CM | POA: Diagnosis not present

## 2016-06-01 DIAGNOSIS — R278 Other lack of coordination: Secondary | ICD-10-CM | POA: Diagnosis not present

## 2016-06-01 DIAGNOSIS — F800 Phonological disorder: Secondary | ICD-10-CM | POA: Diagnosis not present

## 2016-06-01 DIAGNOSIS — M6281 Muscle weakness (generalized): Secondary | ICD-10-CM | POA: Diagnosis not present

## 2016-06-01 DIAGNOSIS — I639 Cerebral infarction, unspecified: Secondary | ICD-10-CM | POA: Diagnosis not present

## 2016-06-01 DIAGNOSIS — I69354 Hemiplegia and hemiparesis following cerebral infarction affecting left non-dominant side: Secondary | ICD-10-CM | POA: Diagnosis not present

## 2016-06-01 DIAGNOSIS — E1151 Type 2 diabetes mellitus with diabetic peripheral angiopathy without gangrene: Secondary | ICD-10-CM | POA: Diagnosis not present

## 2016-06-02 ENCOUNTER — Encounter: Payer: Self-pay | Admitting: Orthopaedic Surgery

## 2016-06-02 ENCOUNTER — Ambulatory Visit (INDEPENDENT_AMBULATORY_CARE_PROVIDER_SITE_OTHER): Payer: Commercial Managed Care - HMO | Admitting: Orthopaedic Surgery

## 2016-06-02 VITALS — BP 137/84 | HR 61 | Temp 97.3°F | Resp 16

## 2016-06-02 DIAGNOSIS — M25561 Pain in right knee: Secondary | ICD-10-CM

## 2016-06-02 DIAGNOSIS — I69354 Hemiplegia and hemiparesis following cerebral infarction affecting left non-dominant side: Secondary | ICD-10-CM | POA: Diagnosis not present

## 2016-06-02 DIAGNOSIS — R278 Other lack of coordination: Secondary | ICD-10-CM | POA: Diagnosis not present

## 2016-06-02 DIAGNOSIS — I639 Cerebral infarction, unspecified: Secondary | ICD-10-CM | POA: Diagnosis not present

## 2016-06-02 DIAGNOSIS — M6281 Muscle weakness (generalized): Secondary | ICD-10-CM | POA: Diagnosis not present

## 2016-06-02 DIAGNOSIS — F800 Phonological disorder: Secondary | ICD-10-CM | POA: Diagnosis not present

## 2016-06-02 NOTE — Progress Notes (Signed)
CC:  I have pain of my right knee. I would like an injection.  The patient has chronic pain of the right knee.  There is no recent trauma.  There is no redness.  Injections in the past have helped.  The knee has no redness, has an effusion and crepitus present.  ROM of the knee is 0-95.  Impression:  Chronic knee pain right.  Return: 6 weeks.  PROCEDURE NOTE:  The patient requests injections of the right knee , verbal consent was obtained.  The right knee was prepped appropriately after time out was performed.   Sterile technique was observed and injection of 1 cc of Depo-Medrol 40 mg with several cc's of plain xylocaine. Anesthesia was provided by ethyl chloride and a 20-gauge needle was used to inject the knee area. The injection was tolerated well.  A band aid dressing was applied.  The patient was advised to apply ice later today and tomorrow to the injection sight as needed.  Electronically Signed Sanjuana Kava, MD 6/28/201710:28 AM

## 2016-06-03 DIAGNOSIS — I639 Cerebral infarction, unspecified: Secondary | ICD-10-CM | POA: Diagnosis not present

## 2016-06-03 DIAGNOSIS — R278 Other lack of coordination: Secondary | ICD-10-CM | POA: Diagnosis not present

## 2016-06-03 DIAGNOSIS — F800 Phonological disorder: Secondary | ICD-10-CM | POA: Diagnosis not present

## 2016-06-03 DIAGNOSIS — I69354 Hemiplegia and hemiparesis following cerebral infarction affecting left non-dominant side: Secondary | ICD-10-CM | POA: Diagnosis not present

## 2016-06-03 DIAGNOSIS — M6281 Muscle weakness (generalized): Secondary | ICD-10-CM | POA: Diagnosis not present

## 2016-06-04 DIAGNOSIS — I639 Cerebral infarction, unspecified: Secondary | ICD-10-CM | POA: Diagnosis not present

## 2016-06-04 DIAGNOSIS — M6281 Muscle weakness (generalized): Secondary | ICD-10-CM | POA: Diagnosis not present

## 2016-06-04 DIAGNOSIS — I69354 Hemiplegia and hemiparesis following cerebral infarction affecting left non-dominant side: Secondary | ICD-10-CM | POA: Diagnosis not present

## 2016-06-04 DIAGNOSIS — R278 Other lack of coordination: Secondary | ICD-10-CM | POA: Diagnosis not present

## 2016-06-04 DIAGNOSIS — F800 Phonological disorder: Secondary | ICD-10-CM | POA: Diagnosis not present

## 2016-06-07 DIAGNOSIS — R278 Other lack of coordination: Secondary | ICD-10-CM | POA: Diagnosis not present

## 2016-06-07 DIAGNOSIS — I69354 Hemiplegia and hemiparesis following cerebral infarction affecting left non-dominant side: Secondary | ICD-10-CM | POA: Diagnosis not present

## 2016-06-07 DIAGNOSIS — F800 Phonological disorder: Secondary | ICD-10-CM | POA: Diagnosis not present

## 2016-06-07 DIAGNOSIS — I639 Cerebral infarction, unspecified: Secondary | ICD-10-CM | POA: Diagnosis not present

## 2016-06-07 DIAGNOSIS — M6281 Muscle weakness (generalized): Secondary | ICD-10-CM | POA: Diagnosis not present

## 2016-06-08 DIAGNOSIS — R278 Other lack of coordination: Secondary | ICD-10-CM | POA: Diagnosis not present

## 2016-06-08 DIAGNOSIS — F800 Phonological disorder: Secondary | ICD-10-CM | POA: Diagnosis not present

## 2016-06-08 DIAGNOSIS — M6281 Muscle weakness (generalized): Secondary | ICD-10-CM | POA: Diagnosis not present

## 2016-06-08 DIAGNOSIS — I69354 Hemiplegia and hemiparesis following cerebral infarction affecting left non-dominant side: Secondary | ICD-10-CM | POA: Diagnosis not present

## 2016-06-08 DIAGNOSIS — I639 Cerebral infarction, unspecified: Secondary | ICD-10-CM | POA: Diagnosis not present

## 2016-06-09 DIAGNOSIS — I69354 Hemiplegia and hemiparesis following cerebral infarction affecting left non-dominant side: Secondary | ICD-10-CM | POA: Diagnosis not present

## 2016-06-09 DIAGNOSIS — R278 Other lack of coordination: Secondary | ICD-10-CM | POA: Diagnosis not present

## 2016-06-09 DIAGNOSIS — N390 Urinary tract infection, site not specified: Secondary | ICD-10-CM | POA: Diagnosis not present

## 2016-06-09 DIAGNOSIS — N189 Chronic kidney disease, unspecified: Secondary | ICD-10-CM | POA: Diagnosis not present

## 2016-06-09 DIAGNOSIS — R4182 Altered mental status, unspecified: Secondary | ICD-10-CM | POA: Diagnosis not present

## 2016-06-09 DIAGNOSIS — M6281 Muscle weakness (generalized): Secondary | ICD-10-CM | POA: Diagnosis not present

## 2016-06-09 DIAGNOSIS — F800 Phonological disorder: Secondary | ICD-10-CM | POA: Diagnosis not present

## 2016-06-09 DIAGNOSIS — I639 Cerebral infarction, unspecified: Secondary | ICD-10-CM | POA: Diagnosis not present

## 2016-06-09 DIAGNOSIS — D649 Anemia, unspecified: Secondary | ICD-10-CM | POA: Diagnosis not present

## 2016-06-10 DIAGNOSIS — R410 Disorientation, unspecified: Secondary | ICD-10-CM | POA: Diagnosis not present

## 2016-06-10 DIAGNOSIS — R1310 Dysphagia, unspecified: Secondary | ICD-10-CM | POA: Diagnosis not present

## 2016-06-10 DIAGNOSIS — R627 Adult failure to thrive: Secondary | ICD-10-CM | POA: Diagnosis not present

## 2016-06-10 DIAGNOSIS — R4182 Altered mental status, unspecified: Secondary | ICD-10-CM | POA: Diagnosis not present

## 2016-06-10 DIAGNOSIS — I639 Cerebral infarction, unspecified: Secondary | ICD-10-CM | POA: Diagnosis not present

## 2016-06-10 DIAGNOSIS — F800 Phonological disorder: Secondary | ICD-10-CM | POA: Diagnosis not present

## 2016-06-10 DIAGNOSIS — I69354 Hemiplegia and hemiparesis following cerebral infarction affecting left non-dominant side: Secondary | ICD-10-CM | POA: Diagnosis not present

## 2016-06-10 DIAGNOSIS — R278 Other lack of coordination: Secondary | ICD-10-CM | POA: Diagnosis not present

## 2016-06-10 DIAGNOSIS — M6281 Muscle weakness (generalized): Secondary | ICD-10-CM | POA: Diagnosis not present

## 2016-06-11 DIAGNOSIS — R278 Other lack of coordination: Secondary | ICD-10-CM | POA: Diagnosis not present

## 2016-06-11 DIAGNOSIS — F800 Phonological disorder: Secondary | ICD-10-CM | POA: Diagnosis not present

## 2016-06-11 DIAGNOSIS — I69354 Hemiplegia and hemiparesis following cerebral infarction affecting left non-dominant side: Secondary | ICD-10-CM | POA: Diagnosis not present

## 2016-06-11 DIAGNOSIS — I639 Cerebral infarction, unspecified: Secondary | ICD-10-CM | POA: Diagnosis not present

## 2016-06-11 DIAGNOSIS — M6281 Muscle weakness (generalized): Secondary | ICD-10-CM | POA: Diagnosis not present

## 2016-06-12 DIAGNOSIS — M6281 Muscle weakness (generalized): Secondary | ICD-10-CM | POA: Diagnosis not present

## 2016-06-12 DIAGNOSIS — F800 Phonological disorder: Secondary | ICD-10-CM | POA: Diagnosis not present

## 2016-06-12 DIAGNOSIS — I69354 Hemiplegia and hemiparesis following cerebral infarction affecting left non-dominant side: Secondary | ICD-10-CM | POA: Diagnosis not present

## 2016-06-12 DIAGNOSIS — I639 Cerebral infarction, unspecified: Secondary | ICD-10-CM | POA: Diagnosis not present

## 2016-06-12 DIAGNOSIS — R278 Other lack of coordination: Secondary | ICD-10-CM | POA: Diagnosis not present

## 2016-06-16 DIAGNOSIS — R278 Other lack of coordination: Secondary | ICD-10-CM | POA: Diagnosis not present

## 2016-06-16 DIAGNOSIS — I639 Cerebral infarction, unspecified: Secondary | ICD-10-CM | POA: Diagnosis not present

## 2016-06-16 DIAGNOSIS — M6281 Muscle weakness (generalized): Secondary | ICD-10-CM | POA: Diagnosis not present

## 2016-06-16 DIAGNOSIS — F800 Phonological disorder: Secondary | ICD-10-CM | POA: Diagnosis not present

## 2016-06-16 DIAGNOSIS — I69354 Hemiplegia and hemiparesis following cerebral infarction affecting left non-dominant side: Secondary | ICD-10-CM | POA: Diagnosis not present

## 2016-06-17 DIAGNOSIS — R627 Adult failure to thrive: Secondary | ICD-10-CM | POA: Diagnosis not present

## 2016-06-17 DIAGNOSIS — R410 Disorientation, unspecified: Secondary | ICD-10-CM | POA: Diagnosis not present

## 2016-06-17 DIAGNOSIS — I639 Cerebral infarction, unspecified: Secondary | ICD-10-CM | POA: Diagnosis not present

## 2016-06-17 DIAGNOSIS — M6281 Muscle weakness (generalized): Secondary | ICD-10-CM | POA: Diagnosis not present

## 2016-06-17 DIAGNOSIS — N939 Abnormal uterine and vaginal bleeding, unspecified: Secondary | ICD-10-CM | POA: Diagnosis not present

## 2016-06-17 DIAGNOSIS — F800 Phonological disorder: Secondary | ICD-10-CM | POA: Diagnosis not present

## 2016-06-17 DIAGNOSIS — R1310 Dysphagia, unspecified: Secondary | ICD-10-CM | POA: Diagnosis not present

## 2016-06-17 DIAGNOSIS — R278 Other lack of coordination: Secondary | ICD-10-CM | POA: Diagnosis not present

## 2016-06-17 DIAGNOSIS — I69354 Hemiplegia and hemiparesis following cerebral infarction affecting left non-dominant side: Secondary | ICD-10-CM | POA: Diagnosis not present

## 2016-06-18 ENCOUNTER — Ambulatory Visit: Payer: Commercial Managed Care - HMO | Admitting: Neurology

## 2016-06-19 DIAGNOSIS — I69354 Hemiplegia and hemiparesis following cerebral infarction affecting left non-dominant side: Secondary | ICD-10-CM | POA: Diagnosis not present

## 2016-06-19 DIAGNOSIS — I639 Cerebral infarction, unspecified: Secondary | ICD-10-CM | POA: Diagnosis not present

## 2016-06-19 DIAGNOSIS — F800 Phonological disorder: Secondary | ICD-10-CM | POA: Diagnosis not present

## 2016-06-19 DIAGNOSIS — R278 Other lack of coordination: Secondary | ICD-10-CM | POA: Diagnosis not present

## 2016-06-19 DIAGNOSIS — M6281 Muscle weakness (generalized): Secondary | ICD-10-CM | POA: Diagnosis not present

## 2016-06-20 DIAGNOSIS — M6281 Muscle weakness (generalized): Secondary | ICD-10-CM | POA: Diagnosis not present

## 2016-06-20 DIAGNOSIS — I639 Cerebral infarction, unspecified: Secondary | ICD-10-CM | POA: Diagnosis not present

## 2016-06-20 DIAGNOSIS — R278 Other lack of coordination: Secondary | ICD-10-CM | POA: Diagnosis not present

## 2016-06-20 DIAGNOSIS — F800 Phonological disorder: Secondary | ICD-10-CM | POA: Diagnosis not present

## 2016-06-20 DIAGNOSIS — I69354 Hemiplegia and hemiparesis following cerebral infarction affecting left non-dominant side: Secondary | ICD-10-CM | POA: Diagnosis not present

## 2016-06-22 DIAGNOSIS — M6281 Muscle weakness (generalized): Secondary | ICD-10-CM | POA: Diagnosis not present

## 2016-06-22 DIAGNOSIS — F800 Phonological disorder: Secondary | ICD-10-CM | POA: Diagnosis not present

## 2016-06-22 DIAGNOSIS — I639 Cerebral infarction, unspecified: Secondary | ICD-10-CM | POA: Diagnosis not present

## 2016-06-22 DIAGNOSIS — I69354 Hemiplegia and hemiparesis following cerebral infarction affecting left non-dominant side: Secondary | ICD-10-CM | POA: Diagnosis not present

## 2016-06-22 DIAGNOSIS — R278 Other lack of coordination: Secondary | ICD-10-CM | POA: Diagnosis not present

## 2016-06-24 DIAGNOSIS — R278 Other lack of coordination: Secondary | ICD-10-CM | POA: Diagnosis not present

## 2016-06-24 DIAGNOSIS — I639 Cerebral infarction, unspecified: Secondary | ICD-10-CM | POA: Diagnosis not present

## 2016-06-24 DIAGNOSIS — M6281 Muscle weakness (generalized): Secondary | ICD-10-CM | POA: Diagnosis not present

## 2016-06-24 DIAGNOSIS — F800 Phonological disorder: Secondary | ICD-10-CM | POA: Diagnosis not present

## 2016-06-24 DIAGNOSIS — I69354 Hemiplegia and hemiparesis following cerebral infarction affecting left non-dominant side: Secondary | ICD-10-CM | POA: Diagnosis not present

## 2016-06-25 DIAGNOSIS — M6281 Muscle weakness (generalized): Secondary | ICD-10-CM | POA: Diagnosis not present

## 2016-06-25 DIAGNOSIS — F800 Phonological disorder: Secondary | ICD-10-CM | POA: Diagnosis not present

## 2016-06-25 DIAGNOSIS — R278 Other lack of coordination: Secondary | ICD-10-CM | POA: Diagnosis not present

## 2016-06-25 DIAGNOSIS — I639 Cerebral infarction, unspecified: Secondary | ICD-10-CM | POA: Diagnosis not present

## 2016-06-25 DIAGNOSIS — I69354 Hemiplegia and hemiparesis following cerebral infarction affecting left non-dominant side: Secondary | ICD-10-CM | POA: Diagnosis not present

## 2016-06-27 DIAGNOSIS — I639 Cerebral infarction, unspecified: Secondary | ICD-10-CM | POA: Diagnosis not present

## 2016-06-27 DIAGNOSIS — I69354 Hemiplegia and hemiparesis following cerebral infarction affecting left non-dominant side: Secondary | ICD-10-CM | POA: Diagnosis not present

## 2016-06-27 DIAGNOSIS — M6281 Muscle weakness (generalized): Secondary | ICD-10-CM | POA: Diagnosis not present

## 2016-06-27 DIAGNOSIS — R278 Other lack of coordination: Secondary | ICD-10-CM | POA: Diagnosis not present

## 2016-06-27 DIAGNOSIS — F800 Phonological disorder: Secondary | ICD-10-CM | POA: Diagnosis not present

## 2016-06-28 ENCOUNTER — Encounter: Payer: Commercial Managed Care - HMO | Attending: Physical Medicine & Rehabilitation

## 2016-06-28 ENCOUNTER — Ambulatory Visit: Payer: Commercial Managed Care - HMO | Admitting: Physical Medicine & Rehabilitation

## 2016-06-28 DIAGNOSIS — F800 Phonological disorder: Secondary | ICD-10-CM | POA: Diagnosis not present

## 2016-06-28 DIAGNOSIS — I639 Cerebral infarction, unspecified: Secondary | ICD-10-CM | POA: Diagnosis not present

## 2016-06-28 DIAGNOSIS — M6281 Muscle weakness (generalized): Secondary | ICD-10-CM | POA: Diagnosis not present

## 2016-06-28 DIAGNOSIS — I69354 Hemiplegia and hemiparesis following cerebral infarction affecting left non-dominant side: Secondary | ICD-10-CM | POA: Diagnosis not present

## 2016-06-28 DIAGNOSIS — R278 Other lack of coordination: Secondary | ICD-10-CM | POA: Diagnosis not present

## 2016-06-29 DIAGNOSIS — I69354 Hemiplegia and hemiparesis following cerebral infarction affecting left non-dominant side: Secondary | ICD-10-CM | POA: Diagnosis not present

## 2016-06-29 DIAGNOSIS — R278 Other lack of coordination: Secondary | ICD-10-CM | POA: Diagnosis not present

## 2016-06-29 DIAGNOSIS — I639 Cerebral infarction, unspecified: Secondary | ICD-10-CM | POA: Diagnosis not present

## 2016-06-29 DIAGNOSIS — F800 Phonological disorder: Secondary | ICD-10-CM | POA: Diagnosis not present

## 2016-06-29 DIAGNOSIS — M6281 Muscle weakness (generalized): Secondary | ICD-10-CM | POA: Diagnosis not present

## 2016-07-01 DIAGNOSIS — R627 Adult failure to thrive: Secondary | ICD-10-CM | POA: Diagnosis not present

## 2016-07-01 DIAGNOSIS — F800 Phonological disorder: Secondary | ICD-10-CM | POA: Diagnosis not present

## 2016-07-01 DIAGNOSIS — I639 Cerebral infarction, unspecified: Secondary | ICD-10-CM | POA: Diagnosis not present

## 2016-07-01 DIAGNOSIS — B373 Candidiasis of vulva and vagina: Secondary | ICD-10-CM | POA: Diagnosis not present

## 2016-07-01 DIAGNOSIS — R278 Other lack of coordination: Secondary | ICD-10-CM | POA: Diagnosis not present

## 2016-07-01 DIAGNOSIS — I69354 Hemiplegia and hemiparesis following cerebral infarction affecting left non-dominant side: Secondary | ICD-10-CM | POA: Diagnosis not present

## 2016-07-01 DIAGNOSIS — R410 Disorientation, unspecified: Secondary | ICD-10-CM | POA: Diagnosis not present

## 2016-07-01 DIAGNOSIS — M6281 Muscle weakness (generalized): Secondary | ICD-10-CM | POA: Diagnosis not present

## 2016-07-01 DIAGNOSIS — R1310 Dysphagia, unspecified: Secondary | ICD-10-CM | POA: Diagnosis not present

## 2016-07-03 DIAGNOSIS — R278 Other lack of coordination: Secondary | ICD-10-CM | POA: Diagnosis not present

## 2016-07-03 DIAGNOSIS — I69354 Hemiplegia and hemiparesis following cerebral infarction affecting left non-dominant side: Secondary | ICD-10-CM | POA: Diagnosis not present

## 2016-07-03 DIAGNOSIS — M6281 Muscle weakness (generalized): Secondary | ICD-10-CM | POA: Diagnosis not present

## 2016-07-03 DIAGNOSIS — I639 Cerebral infarction, unspecified: Secondary | ICD-10-CM | POA: Diagnosis not present

## 2016-07-03 DIAGNOSIS — F800 Phonological disorder: Secondary | ICD-10-CM | POA: Diagnosis not present

## 2016-07-04 DIAGNOSIS — I639 Cerebral infarction, unspecified: Secondary | ICD-10-CM | POA: Diagnosis not present

## 2016-07-04 DIAGNOSIS — M6281 Muscle weakness (generalized): Secondary | ICD-10-CM | POA: Diagnosis not present

## 2016-07-04 DIAGNOSIS — I69354 Hemiplegia and hemiparesis following cerebral infarction affecting left non-dominant side: Secondary | ICD-10-CM | POA: Diagnosis not present

## 2016-07-04 DIAGNOSIS — R278 Other lack of coordination: Secondary | ICD-10-CM | POA: Diagnosis not present

## 2016-07-04 DIAGNOSIS — F800 Phonological disorder: Secondary | ICD-10-CM | POA: Diagnosis not present

## 2016-07-05 DIAGNOSIS — I639 Cerebral infarction, unspecified: Secondary | ICD-10-CM | POA: Diagnosis not present

## 2016-07-05 DIAGNOSIS — M6281 Muscle weakness (generalized): Secondary | ICD-10-CM | POA: Diagnosis not present

## 2016-07-05 DIAGNOSIS — F800 Phonological disorder: Secondary | ICD-10-CM | POA: Diagnosis not present

## 2016-07-05 DIAGNOSIS — I69354 Hemiplegia and hemiparesis following cerebral infarction affecting left non-dominant side: Secondary | ICD-10-CM | POA: Diagnosis not present

## 2016-07-05 DIAGNOSIS — R278 Other lack of coordination: Secondary | ICD-10-CM | POA: Diagnosis not present

## 2016-07-07 DIAGNOSIS — F800 Phonological disorder: Secondary | ICD-10-CM | POA: Diagnosis not present

## 2016-07-07 DIAGNOSIS — I69354 Hemiplegia and hemiparesis following cerebral infarction affecting left non-dominant side: Secondary | ICD-10-CM | POA: Diagnosis not present

## 2016-07-07 DIAGNOSIS — M6281 Muscle weakness (generalized): Secondary | ICD-10-CM | POA: Diagnosis not present

## 2016-07-07 DIAGNOSIS — I639 Cerebral infarction, unspecified: Secondary | ICD-10-CM | POA: Diagnosis not present

## 2016-07-07 DIAGNOSIS — R278 Other lack of coordination: Secondary | ICD-10-CM | POA: Diagnosis not present

## 2016-07-08 DIAGNOSIS — R278 Other lack of coordination: Secondary | ICD-10-CM | POA: Diagnosis not present

## 2016-07-08 DIAGNOSIS — M6281 Muscle weakness (generalized): Secondary | ICD-10-CM | POA: Diagnosis not present

## 2016-07-08 DIAGNOSIS — I639 Cerebral infarction, unspecified: Secondary | ICD-10-CM | POA: Diagnosis not present

## 2016-07-08 DIAGNOSIS — F800 Phonological disorder: Secondary | ICD-10-CM | POA: Diagnosis not present

## 2016-07-08 DIAGNOSIS — I69354 Hemiplegia and hemiparesis following cerebral infarction affecting left non-dominant side: Secondary | ICD-10-CM | POA: Diagnosis not present

## 2016-07-10 DIAGNOSIS — F800 Phonological disorder: Secondary | ICD-10-CM | POA: Diagnosis not present

## 2016-07-10 DIAGNOSIS — M6281 Muscle weakness (generalized): Secondary | ICD-10-CM | POA: Diagnosis not present

## 2016-07-10 DIAGNOSIS — R278 Other lack of coordination: Secondary | ICD-10-CM | POA: Diagnosis not present

## 2016-07-10 DIAGNOSIS — I639 Cerebral infarction, unspecified: Secondary | ICD-10-CM | POA: Diagnosis not present

## 2016-07-10 DIAGNOSIS — I69354 Hemiplegia and hemiparesis following cerebral infarction affecting left non-dominant side: Secondary | ICD-10-CM | POA: Diagnosis not present

## 2016-07-11 DIAGNOSIS — M6281 Muscle weakness (generalized): Secondary | ICD-10-CM | POA: Diagnosis not present

## 2016-07-11 DIAGNOSIS — I69354 Hemiplegia and hemiparesis following cerebral infarction affecting left non-dominant side: Secondary | ICD-10-CM | POA: Diagnosis not present

## 2016-07-11 DIAGNOSIS — R278 Other lack of coordination: Secondary | ICD-10-CM | POA: Diagnosis not present

## 2016-07-11 DIAGNOSIS — F800 Phonological disorder: Secondary | ICD-10-CM | POA: Diagnosis not present

## 2016-07-11 DIAGNOSIS — I639 Cerebral infarction, unspecified: Secondary | ICD-10-CM | POA: Diagnosis not present

## 2016-07-12 DIAGNOSIS — M6281 Muscle weakness (generalized): Secondary | ICD-10-CM | POA: Diagnosis not present

## 2016-07-12 DIAGNOSIS — I69354 Hemiplegia and hemiparesis following cerebral infarction affecting left non-dominant side: Secondary | ICD-10-CM | POA: Diagnosis not present

## 2016-07-12 DIAGNOSIS — R278 Other lack of coordination: Secondary | ICD-10-CM | POA: Diagnosis not present

## 2016-07-12 DIAGNOSIS — F800 Phonological disorder: Secondary | ICD-10-CM | POA: Diagnosis not present

## 2016-07-12 DIAGNOSIS — I639 Cerebral infarction, unspecified: Secondary | ICD-10-CM | POA: Diagnosis not present

## 2016-07-13 DIAGNOSIS — I639 Cerebral infarction, unspecified: Secondary | ICD-10-CM | POA: Diagnosis not present

## 2016-07-13 DIAGNOSIS — M6281 Muscle weakness (generalized): Secondary | ICD-10-CM | POA: Diagnosis not present

## 2016-07-13 DIAGNOSIS — I69354 Hemiplegia and hemiparesis following cerebral infarction affecting left non-dominant side: Secondary | ICD-10-CM | POA: Diagnosis not present

## 2016-07-13 DIAGNOSIS — R278 Other lack of coordination: Secondary | ICD-10-CM | POA: Diagnosis not present

## 2016-07-13 DIAGNOSIS — F800 Phonological disorder: Secondary | ICD-10-CM | POA: Diagnosis not present

## 2016-07-14 ENCOUNTER — Ambulatory Visit: Payer: Commercial Managed Care - HMO | Admitting: Obstetrics and Gynecology

## 2016-07-14 DIAGNOSIS — I69354 Hemiplegia and hemiparesis following cerebral infarction affecting left non-dominant side: Secondary | ICD-10-CM | POA: Diagnosis not present

## 2016-07-14 DIAGNOSIS — F800 Phonological disorder: Secondary | ICD-10-CM | POA: Diagnosis not present

## 2016-07-14 DIAGNOSIS — M6281 Muscle weakness (generalized): Secondary | ICD-10-CM | POA: Diagnosis not present

## 2016-07-14 DIAGNOSIS — R278 Other lack of coordination: Secondary | ICD-10-CM | POA: Diagnosis not present

## 2016-07-14 DIAGNOSIS — I639 Cerebral infarction, unspecified: Secondary | ICD-10-CM | POA: Diagnosis not present

## 2016-07-15 ENCOUNTER — Ambulatory Visit: Payer: Commercial Managed Care - HMO | Admitting: Orthopaedic Surgery

## 2016-07-15 DIAGNOSIS — F800 Phonological disorder: Secondary | ICD-10-CM | POA: Diagnosis not present

## 2016-07-15 DIAGNOSIS — M6281 Muscle weakness (generalized): Secondary | ICD-10-CM | POA: Diagnosis not present

## 2016-07-15 DIAGNOSIS — I69354 Hemiplegia and hemiparesis following cerebral infarction affecting left non-dominant side: Secondary | ICD-10-CM | POA: Diagnosis not present

## 2016-07-15 DIAGNOSIS — I639 Cerebral infarction, unspecified: Secondary | ICD-10-CM | POA: Diagnosis not present

## 2016-07-15 DIAGNOSIS — R278 Other lack of coordination: Secondary | ICD-10-CM | POA: Diagnosis not present

## 2016-07-16 DIAGNOSIS — Z7409 Other reduced mobility: Secondary | ICD-10-CM | POA: Diagnosis not present

## 2016-07-16 DIAGNOSIS — I639 Cerebral infarction, unspecified: Secondary | ICD-10-CM | POA: Diagnosis not present

## 2016-07-16 DIAGNOSIS — M6281 Muscle weakness (generalized): Secondary | ICD-10-CM | POA: Diagnosis not present

## 2016-07-16 DIAGNOSIS — R32 Unspecified urinary incontinence: Secondary | ICD-10-CM | POA: Diagnosis not present

## 2016-07-16 DIAGNOSIS — F800 Phonological disorder: Secondary | ICD-10-CM | POA: Diagnosis not present

## 2016-07-16 DIAGNOSIS — I69354 Hemiplegia and hemiparesis following cerebral infarction affecting left non-dominant side: Secondary | ICD-10-CM | POA: Diagnosis not present

## 2016-07-16 DIAGNOSIS — R1310 Dysphagia, unspecified: Secondary | ICD-10-CM | POA: Diagnosis not present

## 2016-07-16 DIAGNOSIS — I4891 Unspecified atrial fibrillation: Secondary | ICD-10-CM | POA: Diagnosis not present

## 2016-07-16 DIAGNOSIS — R278 Other lack of coordination: Secondary | ICD-10-CM | POA: Diagnosis not present

## 2016-07-19 DIAGNOSIS — M6281 Muscle weakness (generalized): Secondary | ICD-10-CM | POA: Diagnosis not present

## 2016-07-19 DIAGNOSIS — F800 Phonological disorder: Secondary | ICD-10-CM | POA: Diagnosis not present

## 2016-07-19 DIAGNOSIS — I69354 Hemiplegia and hemiparesis following cerebral infarction affecting left non-dominant side: Secondary | ICD-10-CM | POA: Diagnosis not present

## 2016-07-19 DIAGNOSIS — R278 Other lack of coordination: Secondary | ICD-10-CM | POA: Diagnosis not present

## 2016-07-19 DIAGNOSIS — I639 Cerebral infarction, unspecified: Secondary | ICD-10-CM | POA: Diagnosis not present

## 2016-07-20 DIAGNOSIS — I69354 Hemiplegia and hemiparesis following cerebral infarction affecting left non-dominant side: Secondary | ICD-10-CM | POA: Diagnosis not present

## 2016-07-20 DIAGNOSIS — F800 Phonological disorder: Secondary | ICD-10-CM | POA: Diagnosis not present

## 2016-07-20 DIAGNOSIS — R278 Other lack of coordination: Secondary | ICD-10-CM | POA: Diagnosis not present

## 2016-07-20 DIAGNOSIS — I639 Cerebral infarction, unspecified: Secondary | ICD-10-CM | POA: Diagnosis not present

## 2016-07-20 DIAGNOSIS — M6281 Muscle weakness (generalized): Secondary | ICD-10-CM | POA: Diagnosis not present

## 2016-07-21 DIAGNOSIS — I639 Cerebral infarction, unspecified: Secondary | ICD-10-CM | POA: Diagnosis not present

## 2016-07-21 DIAGNOSIS — F800 Phonological disorder: Secondary | ICD-10-CM | POA: Diagnosis not present

## 2016-07-21 DIAGNOSIS — I69354 Hemiplegia and hemiparesis following cerebral infarction affecting left non-dominant side: Secondary | ICD-10-CM | POA: Diagnosis not present

## 2016-07-21 DIAGNOSIS — M6281 Muscle weakness (generalized): Secondary | ICD-10-CM | POA: Diagnosis not present

## 2016-07-21 DIAGNOSIS — R278 Other lack of coordination: Secondary | ICD-10-CM | POA: Diagnosis not present

## 2016-07-22 DIAGNOSIS — F800 Phonological disorder: Secondary | ICD-10-CM | POA: Diagnosis not present

## 2016-07-22 DIAGNOSIS — R278 Other lack of coordination: Secondary | ICD-10-CM | POA: Diagnosis not present

## 2016-07-22 DIAGNOSIS — I69354 Hemiplegia and hemiparesis following cerebral infarction affecting left non-dominant side: Secondary | ICD-10-CM | POA: Diagnosis not present

## 2016-07-22 DIAGNOSIS — M6281 Muscle weakness (generalized): Secondary | ICD-10-CM | POA: Diagnosis not present

## 2016-07-22 DIAGNOSIS — I639 Cerebral infarction, unspecified: Secondary | ICD-10-CM | POA: Diagnosis not present

## 2016-07-23 DIAGNOSIS — I639 Cerebral infarction, unspecified: Secondary | ICD-10-CM | POA: Diagnosis not present

## 2016-07-23 DIAGNOSIS — M6281 Muscle weakness (generalized): Secondary | ICD-10-CM | POA: Diagnosis not present

## 2016-07-23 DIAGNOSIS — F800 Phonological disorder: Secondary | ICD-10-CM | POA: Diagnosis not present

## 2016-07-23 DIAGNOSIS — R278 Other lack of coordination: Secondary | ICD-10-CM | POA: Diagnosis not present

## 2016-07-23 DIAGNOSIS — I69354 Hemiplegia and hemiparesis following cerebral infarction affecting left non-dominant side: Secondary | ICD-10-CM | POA: Diagnosis not present

## 2016-07-25 DIAGNOSIS — R278 Other lack of coordination: Secondary | ICD-10-CM | POA: Diagnosis not present

## 2016-07-25 DIAGNOSIS — I69354 Hemiplegia and hemiparesis following cerebral infarction affecting left non-dominant side: Secondary | ICD-10-CM | POA: Diagnosis not present

## 2016-07-25 DIAGNOSIS — I639 Cerebral infarction, unspecified: Secondary | ICD-10-CM | POA: Diagnosis not present

## 2016-07-25 DIAGNOSIS — F800 Phonological disorder: Secondary | ICD-10-CM | POA: Diagnosis not present

## 2016-07-25 DIAGNOSIS — M6281 Muscle weakness (generalized): Secondary | ICD-10-CM | POA: Diagnosis not present

## 2016-07-27 ENCOUNTER — Ambulatory Visit: Payer: Commercial Managed Care - HMO | Admitting: Neurology

## 2016-07-27 DIAGNOSIS — M6281 Muscle weakness (generalized): Secondary | ICD-10-CM | POA: Diagnosis not present

## 2016-07-27 DIAGNOSIS — F800 Phonological disorder: Secondary | ICD-10-CM | POA: Diagnosis not present

## 2016-07-27 DIAGNOSIS — I69354 Hemiplegia and hemiparesis following cerebral infarction affecting left non-dominant side: Secondary | ICD-10-CM | POA: Diagnosis not present

## 2016-07-27 DIAGNOSIS — I639 Cerebral infarction, unspecified: Secondary | ICD-10-CM | POA: Diagnosis not present

## 2016-07-27 DIAGNOSIS — R278 Other lack of coordination: Secondary | ICD-10-CM | POA: Diagnosis not present

## 2016-07-28 DIAGNOSIS — R278 Other lack of coordination: Secondary | ICD-10-CM | POA: Diagnosis not present

## 2016-07-28 DIAGNOSIS — I639 Cerebral infarction, unspecified: Secondary | ICD-10-CM | POA: Diagnosis not present

## 2016-07-28 DIAGNOSIS — M6281 Muscle weakness (generalized): Secondary | ICD-10-CM | POA: Diagnosis not present

## 2016-07-28 DIAGNOSIS — I69354 Hemiplegia and hemiparesis following cerebral infarction affecting left non-dominant side: Secondary | ICD-10-CM | POA: Diagnosis not present

## 2016-07-28 DIAGNOSIS — F800 Phonological disorder: Secondary | ICD-10-CM | POA: Diagnosis not present

## 2016-07-30 DIAGNOSIS — I69354 Hemiplegia and hemiparesis following cerebral infarction affecting left non-dominant side: Secondary | ICD-10-CM | POA: Diagnosis not present

## 2016-07-30 DIAGNOSIS — M6281 Muscle weakness (generalized): Secondary | ICD-10-CM | POA: Diagnosis not present

## 2016-07-30 DIAGNOSIS — R278 Other lack of coordination: Secondary | ICD-10-CM | POA: Diagnosis not present

## 2016-07-30 DIAGNOSIS — F800 Phonological disorder: Secondary | ICD-10-CM | POA: Diagnosis not present

## 2016-07-30 DIAGNOSIS — I639 Cerebral infarction, unspecified: Secondary | ICD-10-CM | POA: Diagnosis not present

## 2016-07-31 DIAGNOSIS — F800 Phonological disorder: Secondary | ICD-10-CM | POA: Diagnosis not present

## 2016-07-31 DIAGNOSIS — I639 Cerebral infarction, unspecified: Secondary | ICD-10-CM | POA: Diagnosis not present

## 2016-07-31 DIAGNOSIS — M6281 Muscle weakness (generalized): Secondary | ICD-10-CM | POA: Diagnosis not present

## 2016-07-31 DIAGNOSIS — I69354 Hemiplegia and hemiparesis following cerebral infarction affecting left non-dominant side: Secondary | ICD-10-CM | POA: Diagnosis not present

## 2016-07-31 DIAGNOSIS — R278 Other lack of coordination: Secondary | ICD-10-CM | POA: Diagnosis not present

## 2016-08-02 DIAGNOSIS — R278 Other lack of coordination: Secondary | ICD-10-CM | POA: Diagnosis not present

## 2016-08-02 DIAGNOSIS — I69354 Hemiplegia and hemiparesis following cerebral infarction affecting left non-dominant side: Secondary | ICD-10-CM | POA: Diagnosis not present

## 2016-08-02 DIAGNOSIS — I639 Cerebral infarction, unspecified: Secondary | ICD-10-CM | POA: Diagnosis not present

## 2016-08-02 DIAGNOSIS — F800 Phonological disorder: Secondary | ICD-10-CM | POA: Diagnosis not present

## 2016-08-02 DIAGNOSIS — M6281 Muscle weakness (generalized): Secondary | ICD-10-CM | POA: Diagnosis not present

## 2016-08-03 DIAGNOSIS — M6281 Muscle weakness (generalized): Secondary | ICD-10-CM | POA: Diagnosis not present

## 2016-08-03 DIAGNOSIS — I639 Cerebral infarction, unspecified: Secondary | ICD-10-CM | POA: Diagnosis not present

## 2016-08-03 DIAGNOSIS — F800 Phonological disorder: Secondary | ICD-10-CM | POA: Diagnosis not present

## 2016-08-03 DIAGNOSIS — I69354 Hemiplegia and hemiparesis following cerebral infarction affecting left non-dominant side: Secondary | ICD-10-CM | POA: Diagnosis not present

## 2016-08-03 DIAGNOSIS — R278 Other lack of coordination: Secondary | ICD-10-CM | POA: Diagnosis not present

## 2016-08-04 DIAGNOSIS — M6281 Muscle weakness (generalized): Secondary | ICD-10-CM | POA: Diagnosis not present

## 2016-08-04 DIAGNOSIS — I639 Cerebral infarction, unspecified: Secondary | ICD-10-CM | POA: Diagnosis not present

## 2016-08-04 DIAGNOSIS — F800 Phonological disorder: Secondary | ICD-10-CM | POA: Diagnosis not present

## 2016-08-04 DIAGNOSIS — R278 Other lack of coordination: Secondary | ICD-10-CM | POA: Diagnosis not present

## 2016-08-04 DIAGNOSIS — I69354 Hemiplegia and hemiparesis following cerebral infarction affecting left non-dominant side: Secondary | ICD-10-CM | POA: Diagnosis not present

## 2016-08-05 DIAGNOSIS — I639 Cerebral infarction, unspecified: Secondary | ICD-10-CM | POA: Diagnosis not present

## 2016-08-05 DIAGNOSIS — M6281 Muscle weakness (generalized): Secondary | ICD-10-CM | POA: Diagnosis not present

## 2016-08-05 DIAGNOSIS — R278 Other lack of coordination: Secondary | ICD-10-CM | POA: Diagnosis not present

## 2016-08-05 DIAGNOSIS — I69354 Hemiplegia and hemiparesis following cerebral infarction affecting left non-dominant side: Secondary | ICD-10-CM | POA: Diagnosis not present

## 2016-08-05 DIAGNOSIS — F800 Phonological disorder: Secondary | ICD-10-CM | POA: Diagnosis not present

## 2016-08-06 DIAGNOSIS — F800 Phonological disorder: Secondary | ICD-10-CM | POA: Diagnosis not present

## 2016-08-06 DIAGNOSIS — I69354 Hemiplegia and hemiparesis following cerebral infarction affecting left non-dominant side: Secondary | ICD-10-CM | POA: Diagnosis not present

## 2016-08-06 DIAGNOSIS — M6281 Muscle weakness (generalized): Secondary | ICD-10-CM | POA: Diagnosis not present

## 2016-08-06 DIAGNOSIS — R278 Other lack of coordination: Secondary | ICD-10-CM | POA: Diagnosis not present

## 2016-08-06 DIAGNOSIS — I639 Cerebral infarction, unspecified: Secondary | ICD-10-CM | POA: Diagnosis not present

## 2016-08-09 DIAGNOSIS — M6281 Muscle weakness (generalized): Secondary | ICD-10-CM | POA: Diagnosis not present

## 2016-08-09 DIAGNOSIS — F800 Phonological disorder: Secondary | ICD-10-CM | POA: Diagnosis not present

## 2016-08-09 DIAGNOSIS — R278 Other lack of coordination: Secondary | ICD-10-CM | POA: Diagnosis not present

## 2016-08-09 DIAGNOSIS — I69354 Hemiplegia and hemiparesis following cerebral infarction affecting left non-dominant side: Secondary | ICD-10-CM | POA: Diagnosis not present

## 2016-08-09 DIAGNOSIS — I639 Cerebral infarction, unspecified: Secondary | ICD-10-CM | POA: Diagnosis not present

## 2016-08-10 DIAGNOSIS — I639 Cerebral infarction, unspecified: Secondary | ICD-10-CM | POA: Diagnosis not present

## 2016-08-10 DIAGNOSIS — R278 Other lack of coordination: Secondary | ICD-10-CM | POA: Diagnosis not present

## 2016-08-10 DIAGNOSIS — M6281 Muscle weakness (generalized): Secondary | ICD-10-CM | POA: Diagnosis not present

## 2016-08-10 DIAGNOSIS — I69354 Hemiplegia and hemiparesis following cerebral infarction affecting left non-dominant side: Secondary | ICD-10-CM | POA: Diagnosis not present

## 2016-08-10 DIAGNOSIS — F800 Phonological disorder: Secondary | ICD-10-CM | POA: Diagnosis not present

## 2016-08-12 DIAGNOSIS — M6281 Muscle weakness (generalized): Secondary | ICD-10-CM | POA: Diagnosis not present

## 2016-08-12 DIAGNOSIS — F800 Phonological disorder: Secondary | ICD-10-CM | POA: Diagnosis not present

## 2016-08-12 DIAGNOSIS — I639 Cerebral infarction, unspecified: Secondary | ICD-10-CM | POA: Diagnosis not present

## 2016-08-12 DIAGNOSIS — R278 Other lack of coordination: Secondary | ICD-10-CM | POA: Diagnosis not present

## 2016-08-12 DIAGNOSIS — I69354 Hemiplegia and hemiparesis following cerebral infarction affecting left non-dominant side: Secondary | ICD-10-CM | POA: Diagnosis not present

## 2016-08-13 DIAGNOSIS — I69354 Hemiplegia and hemiparesis following cerebral infarction affecting left non-dominant side: Secondary | ICD-10-CM | POA: Diagnosis not present

## 2016-08-13 DIAGNOSIS — R278 Other lack of coordination: Secondary | ICD-10-CM | POA: Diagnosis not present

## 2016-08-13 DIAGNOSIS — I639 Cerebral infarction, unspecified: Secondary | ICD-10-CM | POA: Diagnosis not present

## 2016-08-13 DIAGNOSIS — F800 Phonological disorder: Secondary | ICD-10-CM | POA: Diagnosis not present

## 2016-08-13 DIAGNOSIS — M6281 Muscle weakness (generalized): Secondary | ICD-10-CM | POA: Diagnosis not present

## 2016-08-13 IMAGING — DX DG KNEE 1-2V*R*
2 series · 2 of 2 positions shown · non-contrast
Comparison: None.

CLINICAL DATA: Pain.  Reported injury during physical therapy

EXAM:
RIGHT KNEE - 1-2 VIEW

[knee ap]
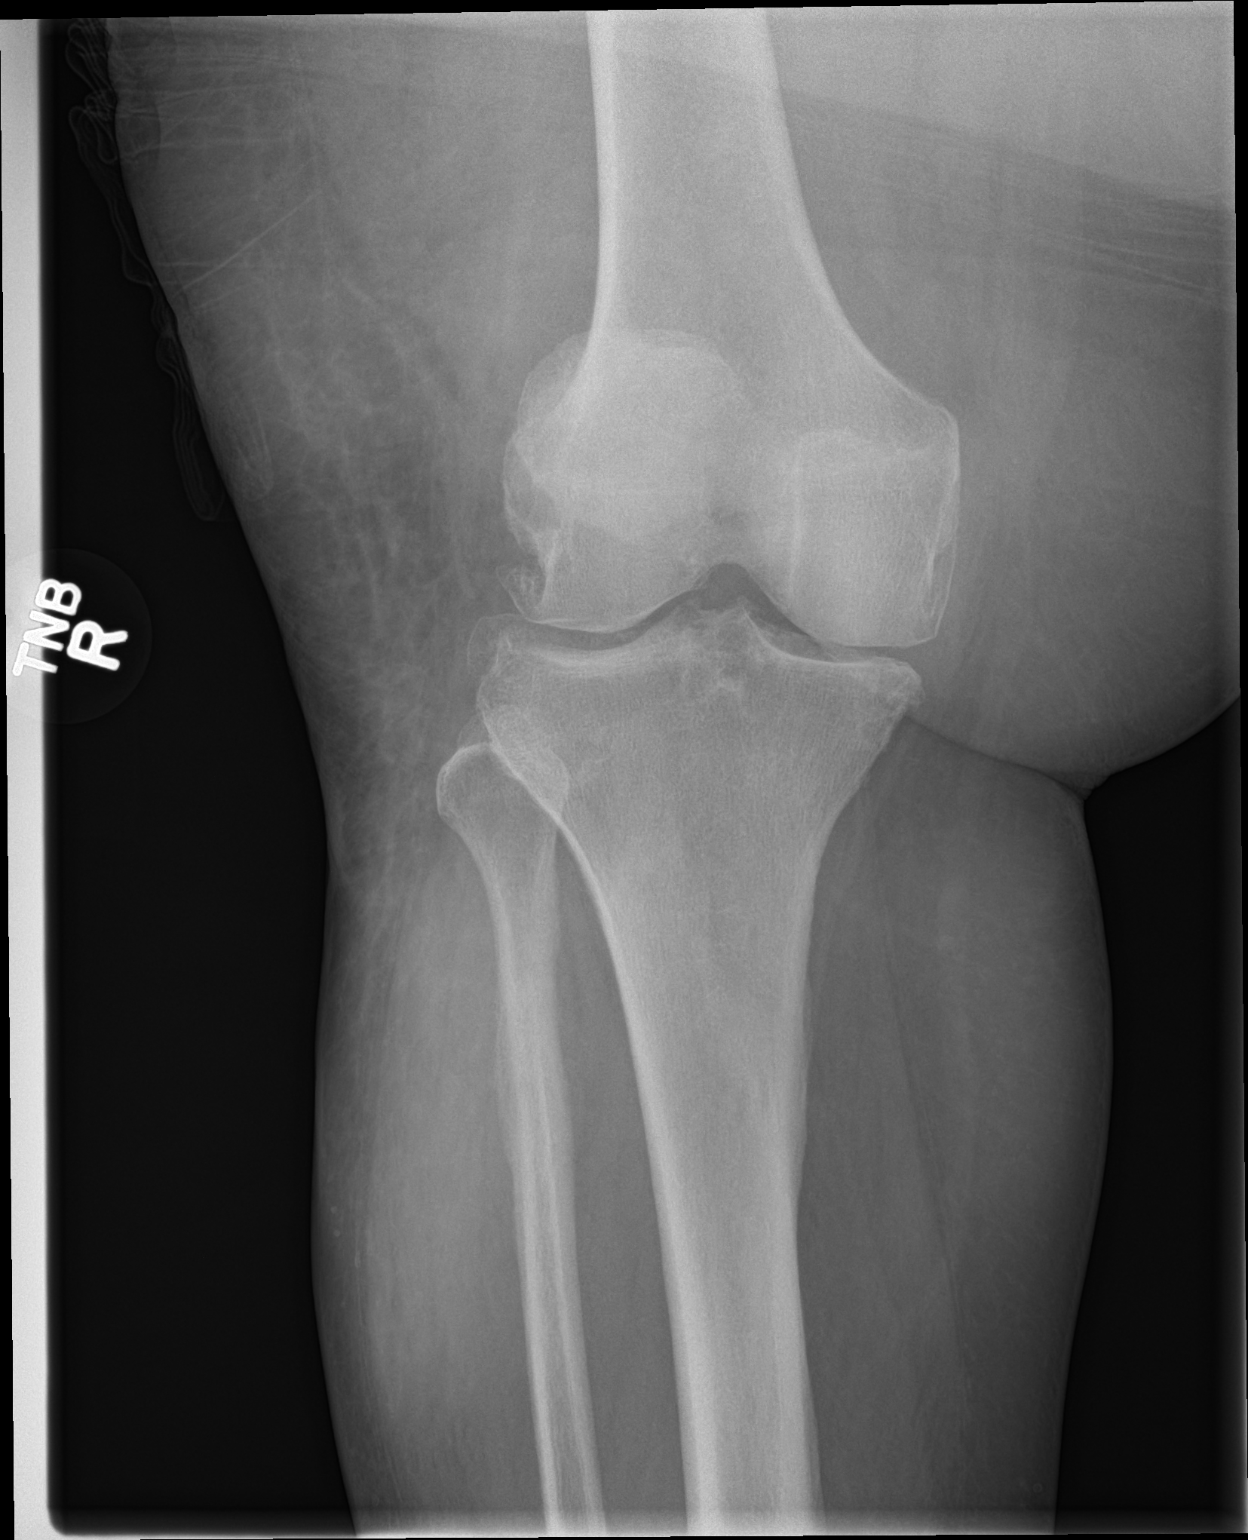

[knee lat]
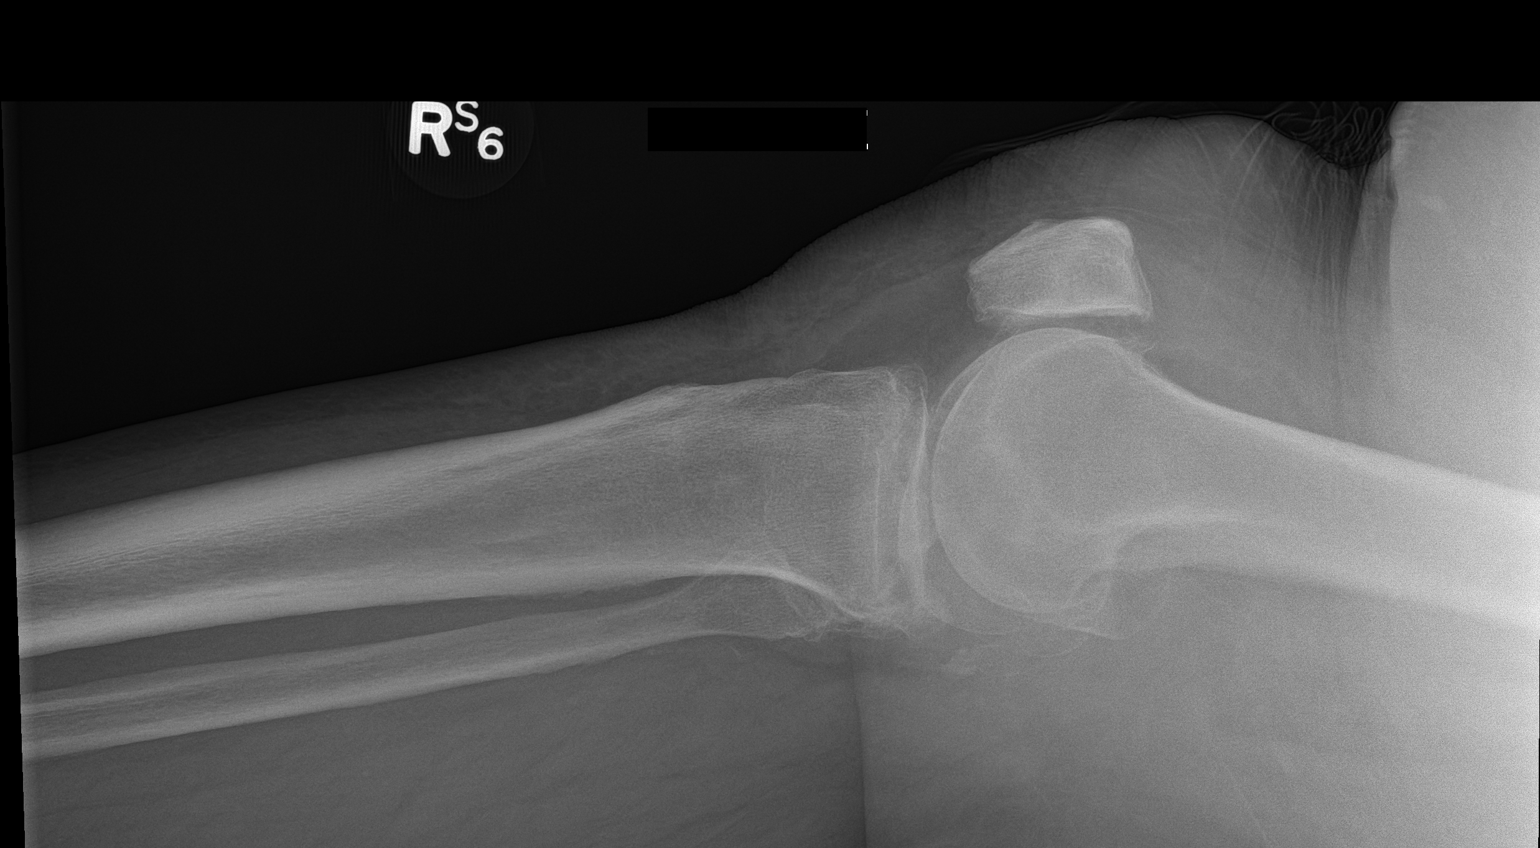

[2 of 2 positions shown; findings below may reference images not displayed]

FINDINGS: Frontal and lateral views were obtained. There is generalized soft
tissue swelling. There is no demonstrable fracture or dislocation.
No appreciable joint effusion. There is generalized joint space
narrowing with spurring in all compartments. No erosive change.
IMPRESSION: Generalized osteoarthritic change. Soft tissue swelling. No fracture
or joint effusion.

## 2016-08-16 DIAGNOSIS — R278 Other lack of coordination: Secondary | ICD-10-CM | POA: Diagnosis not present

## 2016-08-16 DIAGNOSIS — F800 Phonological disorder: Secondary | ICD-10-CM | POA: Diagnosis not present

## 2016-08-16 DIAGNOSIS — I69354 Hemiplegia and hemiparesis following cerebral infarction affecting left non-dominant side: Secondary | ICD-10-CM | POA: Diagnosis not present

## 2016-08-16 DIAGNOSIS — M6281 Muscle weakness (generalized): Secondary | ICD-10-CM | POA: Diagnosis not present

## 2016-08-16 DIAGNOSIS — I639 Cerebral infarction, unspecified: Secondary | ICD-10-CM | POA: Diagnosis not present

## 2016-08-18 DIAGNOSIS — M6281 Muscle weakness (generalized): Secondary | ICD-10-CM | POA: Diagnosis not present

## 2016-08-18 DIAGNOSIS — I69354 Hemiplegia and hemiparesis following cerebral infarction affecting left non-dominant side: Secondary | ICD-10-CM | POA: Diagnosis not present

## 2016-08-18 DIAGNOSIS — R278 Other lack of coordination: Secondary | ICD-10-CM | POA: Diagnosis not present

## 2016-08-18 DIAGNOSIS — I639 Cerebral infarction, unspecified: Secondary | ICD-10-CM | POA: Diagnosis not present

## 2016-08-18 DIAGNOSIS — F800 Phonological disorder: Secondary | ICD-10-CM | POA: Diagnosis not present

## 2016-08-19 DIAGNOSIS — R278 Other lack of coordination: Secondary | ICD-10-CM | POA: Diagnosis not present

## 2016-08-19 DIAGNOSIS — I639 Cerebral infarction, unspecified: Secondary | ICD-10-CM | POA: Diagnosis not present

## 2016-08-19 DIAGNOSIS — F800 Phonological disorder: Secondary | ICD-10-CM | POA: Diagnosis not present

## 2016-08-19 DIAGNOSIS — M6281 Muscle weakness (generalized): Secondary | ICD-10-CM | POA: Diagnosis not present

## 2016-08-19 DIAGNOSIS — I69354 Hemiplegia and hemiparesis following cerebral infarction affecting left non-dominant side: Secondary | ICD-10-CM | POA: Diagnosis not present

## 2016-08-20 DIAGNOSIS — I639 Cerebral infarction, unspecified: Secondary | ICD-10-CM | POA: Diagnosis not present

## 2016-08-20 DIAGNOSIS — F800 Phonological disorder: Secondary | ICD-10-CM | POA: Diagnosis not present

## 2016-08-20 DIAGNOSIS — I69354 Hemiplegia and hemiparesis following cerebral infarction affecting left non-dominant side: Secondary | ICD-10-CM | POA: Diagnosis not present

## 2016-08-20 DIAGNOSIS — M6281 Muscle weakness (generalized): Secondary | ICD-10-CM | POA: Diagnosis not present

## 2016-08-20 DIAGNOSIS — R278 Other lack of coordination: Secondary | ICD-10-CM | POA: Diagnosis not present

## 2016-08-23 DIAGNOSIS — I69354 Hemiplegia and hemiparesis following cerebral infarction affecting left non-dominant side: Secondary | ICD-10-CM | POA: Diagnosis not present

## 2016-08-23 DIAGNOSIS — M6281 Muscle weakness (generalized): Secondary | ICD-10-CM | POA: Diagnosis not present

## 2016-08-23 DIAGNOSIS — R278 Other lack of coordination: Secondary | ICD-10-CM | POA: Diagnosis not present

## 2016-08-23 DIAGNOSIS — F800 Phonological disorder: Secondary | ICD-10-CM | POA: Diagnosis not present

## 2016-08-23 DIAGNOSIS — I639 Cerebral infarction, unspecified: Secondary | ICD-10-CM | POA: Diagnosis not present

## 2016-08-24 DIAGNOSIS — L84 Corns and callosities: Secondary | ICD-10-CM | POA: Diagnosis not present

## 2016-08-24 DIAGNOSIS — E1151 Type 2 diabetes mellitus with diabetic peripheral angiopathy without gangrene: Secondary | ICD-10-CM | POA: Diagnosis not present

## 2016-08-25 DIAGNOSIS — M6281 Muscle weakness (generalized): Secondary | ICD-10-CM | POA: Diagnosis not present

## 2016-08-25 DIAGNOSIS — I69354 Hemiplegia and hemiparesis following cerebral infarction affecting left non-dominant side: Secondary | ICD-10-CM | POA: Diagnosis not present

## 2016-08-25 DIAGNOSIS — F800 Phonological disorder: Secondary | ICD-10-CM | POA: Diagnosis not present

## 2016-08-25 DIAGNOSIS — R278 Other lack of coordination: Secondary | ICD-10-CM | POA: Diagnosis not present

## 2016-08-25 DIAGNOSIS — I639 Cerebral infarction, unspecified: Secondary | ICD-10-CM | POA: Diagnosis not present

## 2016-08-26 DIAGNOSIS — I639 Cerebral infarction, unspecified: Secondary | ICD-10-CM | POA: Diagnosis not present

## 2016-08-26 DIAGNOSIS — I69354 Hemiplegia and hemiparesis following cerebral infarction affecting left non-dominant side: Secondary | ICD-10-CM | POA: Diagnosis not present

## 2016-08-26 DIAGNOSIS — F800 Phonological disorder: Secondary | ICD-10-CM | POA: Diagnosis not present

## 2016-08-26 DIAGNOSIS — R1310 Dysphagia, unspecified: Secondary | ICD-10-CM | POA: Diagnosis not present

## 2016-08-26 DIAGNOSIS — M6281 Muscle weakness (generalized): Secondary | ICD-10-CM | POA: Diagnosis not present

## 2016-08-26 DIAGNOSIS — R278 Other lack of coordination: Secondary | ICD-10-CM | POA: Diagnosis not present

## 2016-08-26 DIAGNOSIS — R4781 Slurred speech: Secondary | ICD-10-CM | POA: Diagnosis not present

## 2016-08-26 DIAGNOSIS — R627 Adult failure to thrive: Secondary | ICD-10-CM | POA: Diagnosis not present

## 2016-08-26 DIAGNOSIS — R410 Disorientation, unspecified: Secondary | ICD-10-CM | POA: Diagnosis not present

## 2016-08-27 DIAGNOSIS — M6281 Muscle weakness (generalized): Secondary | ICD-10-CM | POA: Diagnosis not present

## 2016-08-27 DIAGNOSIS — I69354 Hemiplegia and hemiparesis following cerebral infarction affecting left non-dominant side: Secondary | ICD-10-CM | POA: Diagnosis not present

## 2016-08-27 DIAGNOSIS — I639 Cerebral infarction, unspecified: Secondary | ICD-10-CM | POA: Diagnosis not present

## 2016-08-27 DIAGNOSIS — F800 Phonological disorder: Secondary | ICD-10-CM | POA: Diagnosis not present

## 2016-08-27 DIAGNOSIS — R278 Other lack of coordination: Secondary | ICD-10-CM | POA: Diagnosis not present

## 2016-08-30 DIAGNOSIS — M6281 Muscle weakness (generalized): Secondary | ICD-10-CM | POA: Diagnosis not present

## 2016-08-30 DIAGNOSIS — I69354 Hemiplegia and hemiparesis following cerebral infarction affecting left non-dominant side: Secondary | ICD-10-CM | POA: Diagnosis not present

## 2016-08-30 DIAGNOSIS — R278 Other lack of coordination: Secondary | ICD-10-CM | POA: Diagnosis not present

## 2016-08-30 DIAGNOSIS — F800 Phonological disorder: Secondary | ICD-10-CM | POA: Diagnosis not present

## 2016-08-30 DIAGNOSIS — I639 Cerebral infarction, unspecified: Secondary | ICD-10-CM | POA: Diagnosis not present

## 2016-08-31 DIAGNOSIS — F800 Phonological disorder: Secondary | ICD-10-CM | POA: Diagnosis not present

## 2016-08-31 DIAGNOSIS — M6281 Muscle weakness (generalized): Secondary | ICD-10-CM | POA: Diagnosis not present

## 2016-08-31 DIAGNOSIS — R278 Other lack of coordination: Secondary | ICD-10-CM | POA: Diagnosis not present

## 2016-08-31 DIAGNOSIS — I639 Cerebral infarction, unspecified: Secondary | ICD-10-CM | POA: Diagnosis not present

## 2016-08-31 DIAGNOSIS — I69354 Hemiplegia and hemiparesis following cerebral infarction affecting left non-dominant side: Secondary | ICD-10-CM | POA: Diagnosis not present

## 2016-09-01 DIAGNOSIS — M6281 Muscle weakness (generalized): Secondary | ICD-10-CM | POA: Diagnosis not present

## 2016-09-01 DIAGNOSIS — I69354 Hemiplegia and hemiparesis following cerebral infarction affecting left non-dominant side: Secondary | ICD-10-CM | POA: Diagnosis not present

## 2016-09-01 DIAGNOSIS — I639 Cerebral infarction, unspecified: Secondary | ICD-10-CM | POA: Diagnosis not present

## 2016-09-01 DIAGNOSIS — R278 Other lack of coordination: Secondary | ICD-10-CM | POA: Diagnosis not present

## 2016-09-01 DIAGNOSIS — F800 Phonological disorder: Secondary | ICD-10-CM | POA: Diagnosis not present

## 2016-09-03 DIAGNOSIS — M6281 Muscle weakness (generalized): Secondary | ICD-10-CM | POA: Diagnosis not present

## 2016-09-03 DIAGNOSIS — I69354 Hemiplegia and hemiparesis following cerebral infarction affecting left non-dominant side: Secondary | ICD-10-CM | POA: Diagnosis not present

## 2016-09-03 DIAGNOSIS — F800 Phonological disorder: Secondary | ICD-10-CM | POA: Diagnosis not present

## 2016-09-03 DIAGNOSIS — I639 Cerebral infarction, unspecified: Secondary | ICD-10-CM | POA: Diagnosis not present

## 2016-09-03 DIAGNOSIS — R278 Other lack of coordination: Secondary | ICD-10-CM | POA: Diagnosis not present

## 2016-09-07 DIAGNOSIS — I639 Cerebral infarction, unspecified: Secondary | ICD-10-CM | POA: Diagnosis not present

## 2016-09-07 DIAGNOSIS — R1310 Dysphagia, unspecified: Secondary | ICD-10-CM | POA: Diagnosis not present

## 2016-09-07 DIAGNOSIS — R1312 Dysphagia, oropharyngeal phase: Secondary | ICD-10-CM | POA: Diagnosis not present

## 2016-09-07 DIAGNOSIS — I69354 Hemiplegia and hemiparesis following cerebral infarction affecting left non-dominant side: Secondary | ICD-10-CM | POA: Diagnosis not present

## 2016-09-07 DIAGNOSIS — F800 Phonological disorder: Secondary | ICD-10-CM | POA: Diagnosis not present

## 2016-09-08 DIAGNOSIS — R1310 Dysphagia, unspecified: Secondary | ICD-10-CM | POA: Diagnosis not present

## 2016-09-08 DIAGNOSIS — F800 Phonological disorder: Secondary | ICD-10-CM | POA: Diagnosis not present

## 2016-09-08 DIAGNOSIS — I69354 Hemiplegia and hemiparesis following cerebral infarction affecting left non-dominant side: Secondary | ICD-10-CM | POA: Diagnosis not present

## 2016-09-08 DIAGNOSIS — R1312 Dysphagia, oropharyngeal phase: Secondary | ICD-10-CM | POA: Diagnosis not present

## 2016-09-08 DIAGNOSIS — I639 Cerebral infarction, unspecified: Secondary | ICD-10-CM | POA: Diagnosis not present

## 2016-09-10 DIAGNOSIS — I639 Cerebral infarction, unspecified: Secondary | ICD-10-CM | POA: Diagnosis not present

## 2016-09-10 DIAGNOSIS — R1312 Dysphagia, oropharyngeal phase: Secondary | ICD-10-CM | POA: Diagnosis not present

## 2016-09-10 DIAGNOSIS — I69354 Hemiplegia and hemiparesis following cerebral infarction affecting left non-dominant side: Secondary | ICD-10-CM | POA: Diagnosis not present

## 2016-09-10 DIAGNOSIS — F800 Phonological disorder: Secondary | ICD-10-CM | POA: Diagnosis not present

## 2016-09-10 DIAGNOSIS — R1310 Dysphagia, unspecified: Secondary | ICD-10-CM | POA: Diagnosis not present

## 2016-09-13 DIAGNOSIS — E119 Type 2 diabetes mellitus without complications: Secondary | ICD-10-CM | POA: Diagnosis not present

## 2016-09-13 DIAGNOSIS — I639 Cerebral infarction, unspecified: Secondary | ICD-10-CM | POA: Diagnosis not present

## 2016-09-13 DIAGNOSIS — R1312 Dysphagia, oropharyngeal phase: Secondary | ICD-10-CM | POA: Diagnosis not present

## 2016-09-13 DIAGNOSIS — H2511 Age-related nuclear cataract, right eye: Secondary | ICD-10-CM | POA: Diagnosis not present

## 2016-09-13 DIAGNOSIS — R1310 Dysphagia, unspecified: Secondary | ICD-10-CM | POA: Diagnosis not present

## 2016-09-13 DIAGNOSIS — I69354 Hemiplegia and hemiparesis following cerebral infarction affecting left non-dominant side: Secondary | ICD-10-CM | POA: Diagnosis not present

## 2016-09-13 DIAGNOSIS — Z961 Presence of intraocular lens: Secondary | ICD-10-CM | POA: Diagnosis not present

## 2016-09-13 DIAGNOSIS — F800 Phonological disorder: Secondary | ICD-10-CM | POA: Diagnosis not present

## 2016-09-14 DIAGNOSIS — F800 Phonological disorder: Secondary | ICD-10-CM | POA: Diagnosis not present

## 2016-09-14 DIAGNOSIS — I69354 Hemiplegia and hemiparesis following cerebral infarction affecting left non-dominant side: Secondary | ICD-10-CM | POA: Diagnosis not present

## 2016-09-14 DIAGNOSIS — R1312 Dysphagia, oropharyngeal phase: Secondary | ICD-10-CM | POA: Diagnosis not present

## 2016-09-14 DIAGNOSIS — R1310 Dysphagia, unspecified: Secondary | ICD-10-CM | POA: Diagnosis not present

## 2016-09-14 DIAGNOSIS — I639 Cerebral infarction, unspecified: Secondary | ICD-10-CM | POA: Diagnosis not present

## 2016-10-14 DIAGNOSIS — I10 Essential (primary) hypertension: Secondary | ICD-10-CM | POA: Diagnosis not present

## 2016-10-14 DIAGNOSIS — R062 Wheezing: Secondary | ICD-10-CM | POA: Diagnosis not present

## 2016-10-14 DIAGNOSIS — R51 Headache: Secondary | ICD-10-CM | POA: Diagnosis not present

## 2016-10-14 DIAGNOSIS — R05 Cough: Secondary | ICD-10-CM | POA: Diagnosis not present

## 2016-10-15 DIAGNOSIS — R918 Other nonspecific abnormal finding of lung field: Secondary | ICD-10-CM | POA: Diagnosis not present

## 2016-10-15 DIAGNOSIS — R05 Cough: Secondary | ICD-10-CM | POA: Diagnosis not present

## 2016-10-15 DIAGNOSIS — J189 Pneumonia, unspecified organism: Secondary | ICD-10-CM | POA: Diagnosis not present

## 2016-10-21 DIAGNOSIS — M545 Low back pain: Secondary | ICD-10-CM | POA: Diagnosis not present

## 2016-10-21 DIAGNOSIS — R1310 Dysphagia, unspecified: Secondary | ICD-10-CM | POA: Diagnosis not present

## 2016-10-21 DIAGNOSIS — K5900 Constipation, unspecified: Secondary | ICD-10-CM | POA: Diagnosis not present

## 2016-10-21 DIAGNOSIS — M533 Sacrococcygeal disorders, not elsewhere classified: Secondary | ICD-10-CM | POA: Diagnosis not present

## 2016-10-21 DIAGNOSIS — W19XXXA Unspecified fall, initial encounter: Secondary | ICD-10-CM | POA: Diagnosis not present

## 2016-10-21 DIAGNOSIS — M47816 Spondylosis without myelopathy or radiculopathy, lumbar region: Secondary | ICD-10-CM | POA: Diagnosis not present

## 2016-10-21 DIAGNOSIS — M25551 Pain in right hip: Secondary | ICD-10-CM | POA: Diagnosis not present

## 2016-12-23 DIAGNOSIS — R451 Restlessness and agitation: Secondary | ICD-10-CM | POA: Diagnosis not present

## 2016-12-23 DIAGNOSIS — R410 Disorientation, unspecified: Secondary | ICD-10-CM | POA: Diagnosis not present

## 2016-12-24 DIAGNOSIS — R319 Hematuria, unspecified: Secondary | ICD-10-CM | POA: Diagnosis not present

## 2016-12-24 DIAGNOSIS — R4182 Altered mental status, unspecified: Secondary | ICD-10-CM | POA: Diagnosis not present

## 2016-12-24 DIAGNOSIS — N390 Urinary tract infection, site not specified: Secondary | ICD-10-CM | POA: Diagnosis not present

## 2017-01-22 DIAGNOSIS — R05 Cough: Secondary | ICD-10-CM | POA: Diagnosis not present

## 2017-01-25 DIAGNOSIS — R509 Fever, unspecified: Secondary | ICD-10-CM | POA: Diagnosis not present

## 2017-01-25 DIAGNOSIS — R05 Cough: Secondary | ICD-10-CM | POA: Diagnosis not present

## 2017-01-25 DIAGNOSIS — M79675 Pain in left toe(s): Secondary | ICD-10-CM | POA: Diagnosis not present

## 2017-01-25 DIAGNOSIS — M79674 Pain in right toe(s): Secondary | ICD-10-CM | POA: Diagnosis not present

## 2017-01-25 DIAGNOSIS — R69 Illness, unspecified: Secondary | ICD-10-CM | POA: Diagnosis not present

## 2017-01-25 DIAGNOSIS — B351 Tinea unguium: Secondary | ICD-10-CM | POA: Diagnosis not present

## 2017-02-24 DIAGNOSIS — M1000 Idiopathic gout, unspecified site: Secondary | ICD-10-CM | POA: Diagnosis not present

## 2017-02-24 DIAGNOSIS — N189 Chronic kidney disease, unspecified: Secondary | ICD-10-CM | POA: Diagnosis not present

## 2017-02-24 DIAGNOSIS — I639 Cerebral infarction, unspecified: Secondary | ICD-10-CM | POA: Diagnosis not present

## 2017-02-24 DIAGNOSIS — J189 Pneumonia, unspecified organism: Secondary | ICD-10-CM | POA: Diagnosis not present

## 2017-02-24 DIAGNOSIS — Z86718 Personal history of other venous thrombosis and embolism: Secondary | ICD-10-CM | POA: Diagnosis not present

## 2017-02-24 DIAGNOSIS — G8190 Hemiplegia, unspecified affecting unspecified side: Secondary | ICD-10-CM | POA: Diagnosis not present

## 2017-02-24 DIAGNOSIS — R4781 Slurred speech: Secondary | ICD-10-CM | POA: Diagnosis not present

## 2017-02-24 DIAGNOSIS — N390 Urinary tract infection, site not specified: Secondary | ICD-10-CM | POA: Diagnosis not present

## 2017-02-26 DIAGNOSIS — N939 Abnormal uterine and vaginal bleeding, unspecified: Secondary | ICD-10-CM | POA: Diagnosis not present

## 2017-02-26 DIAGNOSIS — D649 Anemia, unspecified: Secondary | ICD-10-CM | POA: Diagnosis not present

## 2017-03-31 DIAGNOSIS — N939 Abnormal uterine and vaginal bleeding, unspecified: Secondary | ICD-10-CM | POA: Diagnosis not present

## 2017-03-31 DIAGNOSIS — R410 Disorientation, unspecified: Secondary | ICD-10-CM | POA: Diagnosis not present

## 2017-03-31 DIAGNOSIS — G4700 Insomnia, unspecified: Secondary | ICD-10-CM | POA: Diagnosis not present

## 2017-03-31 DIAGNOSIS — I480 Paroxysmal atrial fibrillation: Secondary | ICD-10-CM | POA: Diagnosis not present

## 2017-04-12 DIAGNOSIS — M79675 Pain in left toe(s): Secondary | ICD-10-CM | POA: Diagnosis not present

## 2017-04-12 DIAGNOSIS — B351 Tinea unguium: Secondary | ICD-10-CM | POA: Diagnosis not present

## 2017-04-12 DIAGNOSIS — M79674 Pain in right toe(s): Secondary | ICD-10-CM | POA: Diagnosis not present

## 2017-04-28 DIAGNOSIS — G4700 Insomnia, unspecified: Secondary | ICD-10-CM | POA: Diagnosis not present

## 2017-04-28 DIAGNOSIS — I480 Paroxysmal atrial fibrillation: Secondary | ICD-10-CM | POA: Diagnosis not present

## 2017-04-28 DIAGNOSIS — I10 Essential (primary) hypertension: Secondary | ICD-10-CM | POA: Diagnosis not present

## 2017-04-28 DIAGNOSIS — N189 Chronic kidney disease, unspecified: Secondary | ICD-10-CM | POA: Diagnosis not present

## 2017-05-03 DIAGNOSIS — I639 Cerebral infarction, unspecified: Secondary | ICD-10-CM | POA: Diagnosis not present

## 2017-05-03 DIAGNOSIS — R4182 Altered mental status, unspecified: Secondary | ICD-10-CM | POA: Diagnosis not present

## 2017-05-03 DIAGNOSIS — D649 Anemia, unspecified: Secondary | ICD-10-CM | POA: Diagnosis not present

## 2017-05-03 DIAGNOSIS — N939 Abnormal uterine and vaginal bleeding, unspecified: Secondary | ICD-10-CM | POA: Diagnosis not present

## 2017-06-30 DIAGNOSIS — E1151 Type 2 diabetes mellitus with diabetic peripheral angiopathy without gangrene: Secondary | ICD-10-CM | POA: Diagnosis not present

## 2017-06-30 DIAGNOSIS — L84 Corns and callosities: Secondary | ICD-10-CM | POA: Diagnosis not present

## 2017-07-21 DIAGNOSIS — R05 Cough: Secondary | ICD-10-CM | POA: Diagnosis not present

## 2017-07-21 DIAGNOSIS — J45998 Other asthma: Secondary | ICD-10-CM | POA: Diagnosis not present

## 2017-07-21 DIAGNOSIS — R627 Adult failure to thrive: Secondary | ICD-10-CM | POA: Diagnosis not present

## 2017-07-21 DIAGNOSIS — I639 Cerebral infarction, unspecified: Secondary | ICD-10-CM | POA: Diagnosis not present

## 2017-07-22 DIAGNOSIS — I517 Cardiomegaly: Secondary | ICD-10-CM | POA: Diagnosis not present

## 2017-07-22 DIAGNOSIS — R05 Cough: Secondary | ICD-10-CM | POA: Diagnosis not present

## 2017-07-22 DIAGNOSIS — D649 Anemia, unspecified: Secondary | ICD-10-CM | POA: Diagnosis not present

## 2017-07-22 DIAGNOSIS — R509 Fever, unspecified: Secondary | ICD-10-CM | POA: Diagnosis not present

## 2017-07-22 DIAGNOSIS — R918 Other nonspecific abnormal finding of lung field: Secondary | ICD-10-CM | POA: Diagnosis not present

## 2017-07-27 ENCOUNTER — Other Ambulatory Visit (HOSPITAL_COMMUNITY): Payer: Self-pay | Admitting: Hospice and Palliative Medicine

## 2017-07-27 DIAGNOSIS — R9389 Abnormal findings on diagnostic imaging of other specified body structures: Secondary | ICD-10-CM

## 2017-08-03 ENCOUNTER — Ambulatory Visit (HOSPITAL_COMMUNITY): Payer: Medicare HMO

## 2017-08-08 DIAGNOSIS — R69 Illness, unspecified: Secondary | ICD-10-CM | POA: Diagnosis not present

## 2017-08-18 ENCOUNTER — Ambulatory Visit (HOSPITAL_COMMUNITY): Payer: Medicare HMO

## 2017-08-19 ENCOUNTER — Ambulatory Visit (HOSPITAL_COMMUNITY)
Admission: RE | Admit: 2017-08-19 | Discharge: 2017-08-19 | Disposition: A | Discharge status: Home / Self Care | Payer: Medicare HMO | Source: Ambulatory Visit | Attending: Hospice and Palliative Medicine | Admitting: Hospice and Palliative Medicine

## 2017-08-19 DIAGNOSIS — R918 Other nonspecific abnormal finding of lung field: Secondary | ICD-10-CM | POA: Insufficient documentation

## 2017-08-19 DIAGNOSIS — C3411 Malignant neoplasm of upper lobe, right bronchus or lung: Secondary | ICD-10-CM | POA: Insufficient documentation

## 2017-08-19 DIAGNOSIS — I2510 Atherosclerotic heart disease of native coronary artery without angina pectoris: Secondary | ICD-10-CM | POA: Diagnosis not present

## 2017-08-19 DIAGNOSIS — R9389 Abnormal findings on diagnostic imaging of other specified body structures: Secondary | ICD-10-CM

## 2017-08-19 DIAGNOSIS — E042 Nontoxic multinodular goiter: Secondary | ICD-10-CM | POA: Diagnosis not present

## 2017-08-19 DIAGNOSIS — R279 Unspecified lack of coordination: Secondary | ICD-10-CM | POA: Diagnosis not present

## 2017-08-19 DIAGNOSIS — I700 Atherosclerosis of aorta: Secondary | ICD-10-CM | POA: Insufficient documentation

## 2017-08-19 DIAGNOSIS — Z7401 Bed confinement status: Secondary | ICD-10-CM | POA: Diagnosis not present

## 2017-08-19 LAB — POCT I-STAT CREATININE: Creatinine, Ser: 0.9 mg/dL (ref 0.44–1.00)

## 2017-08-19 MED ORDER — IOPAMIDOL (ISOVUE-300) INJECTION 61%
100.0000 mL | Freq: Once | INTRAVENOUS | Status: AC | PRN
Start: 2017-08-19 — End: 2017-08-19
  Administered 2017-08-19: 75 mL via INTRAVENOUS

## 2017-09-01 DIAGNOSIS — I679 Cerebrovascular disease, unspecified: Secondary | ICD-10-CM | POA: Diagnosis not present

## 2017-09-01 DIAGNOSIS — I2510 Atherosclerotic heart disease of native coronary artery without angina pectoris: Secondary | ICD-10-CM | POA: Diagnosis not present

## 2017-09-01 DIAGNOSIS — G8194 Hemiplegia, unspecified affecting left nondominant side: Secondary | ICD-10-CM | POA: Diagnosis not present

## 2017-09-01 DIAGNOSIS — Z8673 Personal history of transient ischemic attack (TIA), and cerebral infarction without residual deficits: Secondary | ICD-10-CM | POA: Diagnosis not present

## 2017-09-13 DIAGNOSIS — E790 Hyperuricemia without signs of inflammatory arthritis and tophaceous disease: Secondary | ICD-10-CM | POA: Diagnosis not present

## 2017-09-13 DIAGNOSIS — E785 Hyperlipidemia, unspecified: Secondary | ICD-10-CM | POA: Diagnosis not present

## 2017-09-14 DIAGNOSIS — R918 Other nonspecific abnormal finding of lung field: Secondary | ICD-10-CM | POA: Diagnosis not present

## 2017-09-14 DIAGNOSIS — E042 Nontoxic multinodular goiter: Secondary | ICD-10-CM | POA: Diagnosis not present

## 2017-09-14 DIAGNOSIS — N939 Abnormal uterine and vaginal bleeding, unspecified: Secondary | ICD-10-CM | POA: Diagnosis not present

## 2017-09-20 DIAGNOSIS — M79674 Pain in right toe(s): Secondary | ICD-10-CM | POA: Diagnosis not present

## 2017-09-20 DIAGNOSIS — M79675 Pain in left toe(s): Secondary | ICD-10-CM | POA: Diagnosis not present

## 2017-09-20 DIAGNOSIS — B351 Tinea unguium: Secondary | ICD-10-CM | POA: Diagnosis not present

## 2017-09-21 ENCOUNTER — Ambulatory Visit (INDEPENDENT_AMBULATORY_CARE_PROVIDER_SITE_OTHER): Payer: Medicare HMO | Admitting: Adult Health

## 2017-09-21 ENCOUNTER — Encounter: Payer: Self-pay | Admitting: Adult Health

## 2017-09-21 VITALS — BP 140/82 | HR 64

## 2017-09-21 DIAGNOSIS — N950 Postmenopausal bleeding: Secondary | ICD-10-CM

## 2017-09-21 NOTE — Addendum Note (Signed)
Addended by: Linton Rump on: 09/21/2017 05:05 PM   Modules accepted: Orders

## 2017-09-21 NOTE — Progress Notes (Signed)
Subjective:     Patient ID: Jeanine Luz, female   DOB: 26-Jul-1941, 76 y.o.   MRN: [id number]  HPI Alli is a 76 year old black female, in for vaginal bleeding for 1-2 weeks.She has PMB in 2016 and had hysteroscopy and D&C that showed simple and complex hyperplasia without atypia and had IUD inserted, and had bleeding again in 2017. She is in wheelchair and does not walk without assistance secondary to stroke.She has been on bleed thinners, but [facility] did not provide updated med list.  Her daughter, [name] is with her and Alany says is Ok to talk with her about results and health  Issues, will make sure she gets on HIPPA form, too.  Review of Systems  + vaginal bleeding for 1-2 weeks  Reviewed past medical,surgical, social and family history. Reviewed medications and allergies.     Objective:   Physical Exam BP 140/82 (BP Location: Right Arm, Patient Position: Sitting, Cuff Size: Large)   Pulse 64   LMP 03/31/2013 She says her weight is about 244 lbs.   Talk only, will get [place] to assess uterus at [facility].If has thickened endometrium will need another D&C. Deferred exam.  Assessment:     1. PMB (postmenopausal bleeding)       Plan:     Get GYN [place] at [facility] 10/23 at 4 pm, will talk when results back

## 2017-09-21 NOTE — Patient Instructions (Signed)
[place] at [facility] 10/23 at 4 pm Be there at 3:45 with full bladder

## 2017-09-27 ENCOUNTER — Ambulatory Visit (HOSPITAL_COMMUNITY): Admission: RE | Admit: 2017-09-27 | Payer: Medicare HMO | Source: Ambulatory Visit

## 2017-09-27 ENCOUNTER — Ambulatory Visit (HOSPITAL_COMMUNITY)
Admission: RE | Admit: 2017-09-27 | Discharge: 2017-09-27 | Disposition: A | Discharge status: Home / Self Care | Payer: Medicare HMO | Source: Ambulatory Visit | Attending: Adult Health | Admitting: Adult Health

## 2017-09-27 DIAGNOSIS — R9389 Abnormal findings on diagnostic imaging of other specified body structures: Secondary | ICD-10-CM | POA: Diagnosis not present

## 2017-09-27 DIAGNOSIS — N950 Postmenopausal bleeding: Secondary | ICD-10-CM | POA: Insufficient documentation

## 2017-09-28 ENCOUNTER — Telehealth: Payer: Self-pay | Admitting: Adult Health

## 2017-09-28 ENCOUNTER — Telehealth: Payer: Self-pay | Admitting: *Deleted

## 2017-09-28 DIAGNOSIS — N950 Postmenopausal bleeding: Secondary | ICD-10-CM

## 2017-09-28 NOTE — Telephone Encounter (Signed)
[name] is aware that Jeany has thickened endometrium and will need D&C at [facility] by Dr Elonda Husky, Janace Hoard to schedule for 10/19/17

## 2017-09-28 NOTE — Telephone Encounter (Signed)
Rise Paganini  called back and said nursing home had called and said mom bleeding heavy now, to check CBC and let us know, will talk with Dr Elonda Husky, and get back with her

## 2017-09-29 NOTE — Telephone Encounter (Signed)
Left message for  [name] that I spoke with [name] at [place] had hgb of 13.3 10/23 and bleeding is when she is being turned, not so much if still, will rx megace 40 mg/ml 3 x 5 days then 2 x 5 days then 1 daily, and plan on [facility] 11/14, but cal if any concerns or questions and faxed copy of [place] results to [facility] at [phone].

## 2017-10-10 DIAGNOSIS — D649 Anemia, unspecified: Secondary | ICD-10-CM | POA: Diagnosis not present

## 2017-10-10 DIAGNOSIS — N938 Other specified abnormal uterine and vaginal bleeding: Secondary | ICD-10-CM | POA: Diagnosis not present

## 2017-10-13 NOTE — Patient Instructions (Signed)
[name]  10/13/2017     @PREFPERIOPPHARMACY @   Your procedure is scheduled on  10/19/2017   Report to [facility] at  51  A.M.  Call this number if you have problems the morning of surgery:  [phone]   Remember:  Do not eat food or drink liquids after midnight.  Take these medicines the morning of surgery with A SIP OF WATER  Allopurinol, cardiazem.   Do not wear jewelry, make-up or nail polish.  Do not wear lotions, powders, or perfumes, or deoderant.  Do not shave 48 hours prior to surgery.  Men may shave face and neck.  Do not bring valuables to the hospital.  [facility] is not responsible for any belongings or valuables.  Contacts, dentures or bridgework may not be worn into surgery.  Leave your suitcase in the car.  After surgery it may be brought to your room.  For patients admitted to the hospital, discharge time will be determined by your treatment team.  Patients discharged the day of surgery will not be allowed to drive home.   Name and phone number of your driver:   family Special instructions:  None  Please read over the following fact sheets that you were given. Anesthesia Post-op Instructions and Care and Recovery After Surgery      Hysteroscopy Hysteroscopy is a procedure used for looking inside the womb (uterus). It may be done for various reasons, including:  To evaluate abnormal bleeding, fibroid (benign, noncancerous) tumors, polyps, scar tissue (adhesions), and possibly cancer of the uterus.  To look for lumps (tumors) and other uterine growths.  To look for causes of why a woman cannot get pregnant (infertility), causes of recurrent loss of pregnancy (miscarriages), or a lost intrauterine device (IUD).  To perform a sterilization by blocking the fallopian tubes from inside the uterus.  In this procedure, a thin, flexible tube with a tiny light and camera on the end of it (hysteroscope) is used to look inside  the uterus. A hysteroscopy should be done right after a menstrual period to be sure you are not pregnant. LET [facility] CARE PROVIDER KNOW ABOUT:  Any allergies you have.  All medicines you are taking, including vitamins, herbs, eye drops, creams, and over-the-counter medicines.  Previous problems you or members of your family have had with the use of anesthetics.  Any blood disorders you have.  Previous surgeries you have had.  Medical conditions you have. RISKS AND COMPLICATIONS Generally, this is a safe procedure. However, as with any procedure, complications can occur. Possible complications include:  Putting a hole in the uterus.  Excessive bleeding.  Infection.  Damage to the cervix.  Injury to other organs.  Allergic reaction to medicines.  Too much fluid used in the uterus for the procedure.  BEFORE THE PROCEDURE  Ask your health care provider about changing or stopping any regular medicines.  Do not take aspirin or blood thinners for 1 week before the procedure, or as directed by your health care provider. These can cause bleeding.  If you smoke, do not smoke for 2 weeks before the procedure.  In some cases, a medicine is placed in the cervix the day before the procedure. This medicine makes the cervix have a larger opening (dilate). This makes it easier for the instrument to be inserted into the uterus during the procedure.  Do not eat or drink anything for  at least 8 hours before the surgery.  Arrange for someone to take you home after the procedure. PROCEDURE  You may be given a medicine to relax you (sedative). You may also be given one of the following: ? A medicine that numbs the area around the cervix (local anesthetic). ? A medicine that makes you sleep through the procedure (general anesthetic).  The hysteroscope is inserted through the vagina into the uterus. The camera on the hysteroscope sends a picture to a TV screen. This gives the surgeon a  good view inside the uterus.  During the procedure, air or a liquid is put into the uterus, which allows the surgeon to see better.  Sometimes, tissue is gently scraped from inside the uterus. These tissue samples are sent to a lab for testing. What to expect after the procedure  If you had a general anesthetic, you may be groggy for a couple hours after the procedure.  If you had a local anesthetic, you will be able to go home as soon as you are stable and feel ready.  You may have some cramping. This normally lasts for a couple days.  You may have bleeding, which varies from light spotting for a few days to menstrual-like bleeding for 3-7 days. This is normal.  If your test results are not back during the visit, make an appointment with your health care provider to find out the results. This information is not intended to replace advice given to you by your health care provider. Make sure you discuss any questions you have with your health care provider. Document Released: 02/28/2001 Document Revised: 04/29/2016 Document Reviewed: 06/21/2013 Elsevier Interactive Patient Education  2017 [place]. Hysteroscopy, Care After Refer to this sheet in the next few weeks. These instructions provide you with information on caring for yourself after your procedure. Your health care provider may also give you more specific instructions. Your treatment has been planned according to current medical practices, but problems sometimes occur. Call your health care provider if you have any problems or questions after your procedure. What can I expect after the procedure? After your procedure, it is typical to have the following:  You may have some cramping. This normally lasts for a couple days.  You may have bleeding. This can vary from light spotting for a few days to menstrual-like bleeding for 3-7 days.  Follow these instructions at home:  Rest for the first 1-2 days after the procedure.  Only  take over-the-counter or prescription medicines as directed by your health care provider. Do not take aspirin. It can increase the chances of bleeding.  Take showers instead of baths for 2 weeks or as directed by your health care provider.  Do not drive for 24 hours or as directed.  Do not drink alcohol while taking pain medicine.  Do not use tampons, douche, or have sexual intercourse for 2 weeks or until your health care provider says it is okay.  Take your temperature twice a day for 4-5 days. Write it down each time.  Follow your health care provider's advice about diet, exercise, and lifting.  If you develop constipation, you may: ? Take a mild laxative if your health care provider approves. ? Add bran foods to your diet. ? Drink enough fluids to keep your urine clear or pale yellow.  Try to have someone with you or available to you for the first 24-48 hours, especially if you were given a general anesthetic.  Follow up with your  health care provider as directed. Contact a health care provider if:  You feel dizzy or lightheaded.  You feel sick to your stomach (nauseous).  You have abnormal vaginal discharge.  You have a rash.  You have pain that is not controlled with medicine. Get help right away if:  You have bleeding that is heavier than a normal menstrual period.  You have a fever.  You have increasing cramps or pain, not controlled with medicine.  You have new belly (abdominal) pain.  You pass out.  You have pain in the tops of your shoulders (shoulder strap areas).  You have shortness of breath. This information is not intended to replace advice given to you by your health care provider. Make sure you discuss any questions you have with your health care provider. Document Released: 09/12/2013 Document Revised: 04/29/2016 Document Reviewed: 06/21/2013 Elsevier Interactive Patient Education  2017 [place].  Dilation and Curettage or Vacuum  Curettage Dilation and curettage (D&C) and vacuum curettage are minor procedures. A D&C involves stretching (dilation) the cervix and scraping (curettage) the inside lining of the uterus (endometrium). During a D&C, tissue is gently scraped from the endometrium, starting from the top portion of the uterus down to the lowest part of the uterus (cervix). During a vacuum curettage, the lining and tissue in the uterus are removed with the use of gentle suction. Curettage may be performed to either diagnose or treat a problem. As a diagnostic procedure, curettage is performed to examine tissues from the uterus. A diagnostic curettage may be done if you have:  Irregular bleeding in the uterus.  Bleeding with the development of clots.  Spotting between menstrual periods.  Prolonged menstrual periods or other abnormal bleeding.  Bleeding after menopause.  No menstrual period (amenorrhea).  A change in size and shape of the uterus.  Abnormal endometrial cells discovered during a Pap test.  As a treatment procedure, curettage may be performed for the following reasons:  Removal of an IUD (intrauterine device).  Removal of retained placenta after giving birth.  Abortion.  Miscarriage.  Removal of endometrial polyps.  Removal of uncommon types of noncancerous lumps (fibroids).  Tell a health care provider about:  Any allergies you have, including allergies to prescribed medicine or latex.  All medicines you are taking, including vitamins, herbs, eye drops, creams, and over-the-counter medicines. This is especially important if you take any blood-thinning medicine. Bring a list of all of your medicines to your appointment.  Any problems you or family members have had with anesthetic medicines.  Any blood disorders you have.  Any surgeries you have had.  Your medical history and any medical conditions you have.  Whether you are pregnant or may be pregnant.  Recent vaginal  infections you have had.  Recent menstrual periods, bleeding problems you have had, and what form of birth control (contraception) you use. What are the risks? Generally, this is a safe procedure. However, problems may occur, including:  Infection.  Heavy vaginal bleeding.  Allergic reactions to medicines.  Damage to the cervix or other structures or organs.  Development of scar tissue (adhesions) inside the uterus, which can cause abnormal amounts of menstrual bleeding. This may make it harder to get pregnant in the future.  A hole (perforation) or puncture in the uterine wall. This is rare.  What happens before the procedure? Staying hydrated Follow instructions from your health care provider about hydration, which may include:  Up to 2 hours before the procedure - you  may continue to drink clear liquids, such as water, clear fruit juice, black coffee, and plain tea.  Eating and drinking restrictions Follow instructions from your health care provider about eating and drinking, which may include:  8 hours before the procedure - stop eating heavy meals or foods such as meat, fried foods, or fatty foods.  6 hours before the procedure - stop eating light meals or foods, such as toast or cereal.  6 hours before the procedure - stop drinking milk or drinks that contain milk.  2 hours before the procedure - stop drinking clear liquids. If your health care provider told you to take your medicine(s) on the day of your procedure, take them with only a sip of water.  Medicines  Ask your health care provider about: ? Changing or stopping your regular medicines. This is especially important if you are taking diabetes medicines or blood thinners. ? Taking medicines such as aspirin and ibuprofen. These medicines can thin your blood. Do not take these medicines before your procedure if your health care provider instructs you not to.  You may be given antibiotic medicine to help prevent  infection. General instructions  For 24 hours before your procedure, do not: ? Douche. ? Use tampons. ? Use medicines, creams, or suppositories in the vagina. ? Have sexual intercourse.  You may be given a pregnancy test on the day of the procedure.  Plan to have someone take you home from the hospital or clinic.  You may have a blood or urine sample taken.  If you will be going home right after the procedure, plan to have someone with you for 24 hours. What happens during the procedure?  To reduce your risk of infection: ? Your health care team will wash or sanitize their hands. ? Your skin will be washed with soap.  An IV tube will be inserted into one of your veins.  You will be given one of the following: ? A medicine that numbs the area in and around the cervix (local anesthetic). ? A medicine to make you fall asleep (general anesthetic).  You will lie down on your back, with your feet in foot rests (stirrups).  The size and position of your uterus will be checked.  A lubricated instrument (speculum or Sims retractor) will be inserted into the back side of your vagina. The speculum will be used to hold apart the walls of your vagina so your health care provider can see your cervix.  A tool (tenaculum) will be attached to the lip of the cervix to stabilize it.  Your cervix will be softened and dilated. This may be done by: ? Taking a medicine. ? Having tapered dilators or thin rods (laminaria) or gradual widening instruments (tapered dilators) inserted into your cervix.  A small, sharp, curved instrument (curette) will be used to scrape a small amount of tissue or cells from the endometrium or cervical canal. In some cases, gentle suction is applied with the curette. The curette will then be removed. The cells will be taken to a lab for testing. The procedure may vary among health care providers and hospitals. What happens after the procedure?  You may have mild  cramping, backache, pain, and light bleeding or spotting. You may pass small blood clots from your vagina.  You may have to wear compression stockings. These stockings help to prevent blood clots and reduce swelling in your legs.  Your blood pressure, heart rate, breathing rate, and blood oxygen level will  be monitored until the medicines you were given have worn off. Summary  Dilation and curettage (D&C) involves stretching (dilation) the cervix and scraping (curettage) the inside lining of the uterus (endometrium).  After the procedure, you may have mild cramping, backache, pain, and light bleeding or spotting. You may pass small blood clots from your vagina.  Plan to have someone take you home from the hospital or clinic. This information is not intended to replace advice given to you by your health care provider. Make sure you discuss any questions you have with your health care provider. Document Released: 11/22/2005 Document Revised: 08/08/2016 Document Reviewed: 08/08/2016 Elsevier Interactive Patient Education  2018 [employer].  Dilation and Curettage or Vacuum Curettage, Care After These instructions give you information about caring for yourself after your procedure. Your doctor may also give you more specific instructions. Call your doctor if you have any problems or questions after your procedure. Follow these instructions at home: Activity  Do not drive or use heavy machinery while taking prescription pain medicine.  For 24 hours after your procedure, avoid driving.  Take short walks often, followed by rest periods. Ask your doctor what activities are safe for you. After one or two days, you may be able to return to your normal activities.  Do not lift anything that is heavier than 10 lb (4.5 kg) until your doctor approves.  For at least 2 weeks, or as long as told by your doctor: ? Do not douche. ? Do not use tampons. ? Do not have sex. General instructions  Take  over-the-counter and prescription medicines only as told by your doctor. This is very important if you take blood thinning medicine.  Do not take baths, swim, or use a hot tub until your doctor approves. Take showers instead of baths.  Wear compression stockings as told by your doctor.  It is up to you to get the results of your procedure. Ask your doctor when your results will be ready.  Keep all follow-up visits as told by your doctor. This is important. Contact a doctor if:  You have very bad cramps that get worse or do not get better with medicine.  You have very bad pain in your belly (abdomen).  You cannot drink fluids without throwing up (vomiting).  You get pain in a different part of the area between your belly and thighs (pelvis).  You have bad-smelling discharge from your vagina.  You have a rash. Get help right away if:  You are bleeding a lot from your vagina. A lot of bleeding means soaking more than one sanitary pad in an hour, for 2 hours in a row.  You have clumps of blood (blood clots) coming from your vagina.  You have a fever or chills.  Your belly feels very tender or hard.  You have chest pain.  You have trouble breathing.  You cough up blood.  You feel dizzy.  You feel light-headed.  You pass out (faint).  You have pain in your neck or shoulder area. Summary  Take short walks often, followed by rest periods. Ask your doctor what activities are safe for you. After one or two days, you may be able to return to your normal activities.  Do not lift anything that is heavier than 10 lb (4.5 kg) until your doctor approves.  Do not take baths, swim, or use a hot tub until your doctor approves. Take showers instead of baths.  Contact your doctor if you have  any symptoms of infection, like bad-smelling discharge from your vagina. This information is not intended to replace advice given to you by your health care provider. Make sure you discuss any  questions you have with your health care provider. Document Released: 08/31/2008 Document Revised: 08/09/2016 Document Reviewed: 08/09/2016 Elsevier Interactive Patient Education  2017 [place] Anesthesia, Adult General anesthesia is the use of medicines to make a person "go to sleep" (be unconscious) for a medical procedure. General anesthesia is often recommended when a procedure:  Is long.  Requires you to be still or in an unusual position.  Is major and can cause you to lose blood.  Is impossible to do without general anesthesia.  The medicines used for general anesthesia are called general anesthetics. In addition to making you sleep, the medicines:  Prevent pain.  Control your blood pressure.  Relax your muscles.  Tell a health care provider about:  Any allergies you have.  All medicines you are taking, including vitamins, herbs, eye drops, creams, and over-the-counter medicines.  Any problems you or family members have had with anesthetic medicines.  Types of anesthetics you have had in the past.  Any bleeding disorders you have.  Any surgeries you have had.  Any medical conditions you have.  Any history of heart or lung conditions, such as heart failure, sleep apnea, or chronic obstructive pulmonary disease (COPD).  Whether you are pregnant or may be pregnant.  Whether you use tobacco, alcohol, marijuana, or street drugs.  Any history of Armed forces logistics/support/administrative officer.  Any history of depression or anxiety. What are the risks? Generally, this is a safe procedure. However, problems may occur, including:  Allergic reaction to anesthetics.  Lung and heart problems.  Inhaling food or liquids from your stomach into your lungs (aspiration).  Injury to nerves.  Waking up during your procedure and being unable to move (rare).  Extreme agitation or a state of mental confusion (delirium) when you wake up from the anesthetic.  Air in the bloodstream,  which can lead to stroke.  These problems are more likely to develop if you are having a major surgery or if you have an advanced medical condition. You can prevent some of these complications by answering all of your health care provider's questions thoroughly and by following all pre-procedure instructions. General anesthesia can cause side effects, including:  Nausea or vomiting  A sore throat from the breathing tube.  Feeling cold or shivery.  Feeling tired, washed out, or achy.  Sleepiness or drowsiness.  Confusion or agitation.  What happens before the procedure? Staying hydrated Follow instructions from your health care provider about hydration, which may include:  Up to 2 hours before the procedure - you may continue to drink clear liquids, such as water, clear fruit juice, black coffee, and plain tea.  Eating and drinking restrictions Follow instructions from your health care provider about eating and drinking, which may include:  8 hours before the procedure - stop eating heavy meals or foods such as meat, fried foods, or fatty foods.  6 hours before the procedure - stop eating light meals or foods, such as toast or cereal.  6 hours before the procedure - stop drinking milk or drinks that contain milk.  2 hours before the procedure - stop drinking clear liquids.  Medicines  Ask your health care provider about: ? Changing or stopping your regular medicines. This is especially important if you are taking diabetes medicines or blood thinners. ? Taking medicines  such as aspirin and ibuprofen. These medicines can thin your blood. Do not take these medicines before your procedure if your health care provider instructs you not to. ? Taking new dietary supplements or medicines. Do not take these during the week before your procedure unless your health care provider approves them.  If you are told to take a medicine or to continue taking a medicine on the day of the  procedure, take the medicine with sips of water. General instructions   Ask if you will be going home the same day, the following day, or after a longer hospital stay. ? Plan to have someone take you home. ? Plan to have someone stay with you for the first 24 hours after you leave the hospital or clinic.  For 3-6 weeks before the procedure, try not to use any tobacco products, such as cigarettes, chewing tobacco, and e-cigarettes.  You may brush your teeth on the morning of the procedure, but make sure to spit out the toothpaste. What happens during the procedure?  You will be given anesthetics through a mask and through an IV tube in one of your veins.  You may receive medicine to help you relax (sedative).  As soon as you are asleep, a breathing tube may be used to help you breathe.  An anesthesia specialist will stay with you throughout the procedure. He or she will help keep you comfortable and safe by continuing to give you medicines and adjusting the amount of medicine that you get. He or she will also watch your blood pressure, pulse, and oxygen levels to make sure that the anesthetics do not cause any problems.  If a breathing tube was used to help you breathe, it will be removed before you wake up. The procedure may vary among health care providers and hospitals. What happens after the procedure?  You will wake up, often slowly, after the procedure is complete, usually in a recovery area.  Your blood pressure, heart rate, breathing rate, and blood oxygen level will be monitored until the medicines you were given have worn off.  You may be given medicine to help you calm down if you feel anxious or agitated.  If you will be going home the same day, your health care provider may check to make sure you can stand, drink, and urinate.  Your health care providers will treat your pain and side effects before you go home.  Do not drive for 24 hours if you received a  sedative.  You may: ? Feel nauseous and vomit. ? Have a sore throat. ? Have mental slowness. ? Feel cold or shivery. ? Feel sleepy. ? Feel tired. ? Feel sore or achy, even in parts of your body where you did not have surgery. This information is not intended to replace advice given to you by your health care provider. Make sure you discuss any questions you have with your health care provider. Document Released: 02/29/2008 Document Revised: 05/04/2016 Document Reviewed: 11/06/2015 Elsevier Interactive Patient Education  2018 [place] Anesthesia, Adult, Care After These instructions provide you with information about caring for yourself after your procedure. Your health care provider may also give you more specific instructions. Your treatment has been planned according to current medical practices, but problems sometimes occur. Call your health care provider if you have any problems or questions after your procedure. What can I expect after the procedure? After the procedure, it is common to have:  Vomiting.  A sore throat.  Mental slowness.  It is common to feel:  Nauseous.  Cold or shivery.  Sleepy.  Tired.  Sore or achy, even in parts of your body where you did not have surgery.  Follow these instructions at home: For at least 24 hours after the procedure:  Do not: ? Participate in activities where you could fall or become injured. ? Drive. ? Use heavy machinery. ? Drink alcohol. ? Take sleeping pills or medicines that cause drowsiness. ? Make important decisions or sign legal documents. ? Take care of children on your own.  Rest. Eating and drinking  If you vomit, drink water, juice, or soup when you can drink without vomiting.  Drink enough fluid to keep your urine clear or pale yellow.  Make sure you have little or no nausea before eating solid foods.  Follow the diet recommended by your health care provider. General instructions  Have a  responsible adult stay with you until you are awake and alert.  Return to your normal activities as told by your health care provider. Ask your health care provider what activities are safe for you.  Take over-the-counter and prescription medicines only as told by your health care provider.  If you smoke, do not smoke without supervision.  Keep all follow-up visits as told by your health care provider. This is important. Contact a health care provider if:  You continue to have nausea or vomiting at home, and medicines are not helpful.  You cannot drink fluids or start eating again.  You cannot urinate after 8-12 hours.  You develop a skin rash.  You have fever.  You have increasing redness at the site of your procedure. Get help right away if:  You have difficulty breathing.  You have chest pain.  You have unexpected bleeding.  You feel that you are having a life-threatening or urgent problem. This information is not intended to replace advice given to you by your health care provider. Make sure you discuss any questions you have with your health care provider. Document Released: 02/28/2001 Document Revised: 04/26/2016 Document Reviewed: 11/06/2015 Elsevier Interactive Patient Education  [employer].

## 2017-10-17 ENCOUNTER — Encounter (HOSPITAL_COMMUNITY): Payer: Self-pay

## 2017-10-17 ENCOUNTER — Other Ambulatory Visit: Payer: Self-pay

## 2017-10-17 ENCOUNTER — Other Ambulatory Visit: Payer: Self-pay | Admitting: Obstetrics & Gynecology

## 2017-10-17 ENCOUNTER — Encounter (HOSPITAL_COMMUNITY)
Admission: RE | Admit: 2017-10-17 | Discharge: 2017-10-17 | Disposition: A | Discharge status: Home / Self Care | Payer: Medicare HMO | Source: Ambulatory Visit | Attending: Obstetrics & Gynecology | Admitting: Obstetrics & Gynecology

## 2017-10-17 DIAGNOSIS — H9190 Unspecified hearing loss, unspecified ear: Secondary | ICD-10-CM | POA: Diagnosis not present

## 2017-10-17 DIAGNOSIS — I10 Essential (primary) hypertension: Secondary | ICD-10-CM | POA: Diagnosis not present

## 2017-10-17 DIAGNOSIS — M549 Dorsalgia, unspecified: Secondary | ICD-10-CM | POA: Diagnosis not present

## 2017-10-17 DIAGNOSIS — I69354 Hemiplegia and hemiparesis following cerebral infarction affecting left non-dominant side: Secondary | ICD-10-CM | POA: Diagnosis not present

## 2017-10-17 DIAGNOSIS — N950 Postmenopausal bleeding: Secondary | ICD-10-CM | POA: Diagnosis not present

## 2017-10-17 DIAGNOSIS — M1990 Unspecified osteoarthritis, unspecified site: Secondary | ICD-10-CM | POA: Diagnosis not present

## 2017-10-17 DIAGNOSIS — E7800 Pure hypercholesterolemia, unspecified: Secondary | ICD-10-CM | POA: Diagnosis not present

## 2017-10-17 DIAGNOSIS — G8929 Other chronic pain: Secondary | ICD-10-CM | POA: Diagnosis not present

## 2017-10-17 DIAGNOSIS — M25569 Pain in unspecified knee: Secondary | ICD-10-CM | POA: Diagnosis not present

## 2017-10-17 DIAGNOSIS — Z87891 Personal history of nicotine dependence: Secondary | ICD-10-CM | POA: Diagnosis not present

## 2017-10-17 DIAGNOSIS — N3941 Urge incontinence: Secondary | ICD-10-CM | POA: Diagnosis not present

## 2017-10-17 DIAGNOSIS — D649 Anemia, unspecified: Secondary | ICD-10-CM | POA: Diagnosis not present

## 2017-10-17 DIAGNOSIS — Z888 Allergy status to other drugs, medicaments and biological substances status: Secondary | ICD-10-CM | POA: Diagnosis not present

## 2017-10-17 DIAGNOSIS — E119 Type 2 diabetes mellitus without complications: Secondary | ICD-10-CM | POA: Diagnosis not present

## 2017-10-17 DIAGNOSIS — I480 Paroxysmal atrial fibrillation: Secondary | ICD-10-CM | POA: Diagnosis not present

## 2017-10-17 DIAGNOSIS — Z91013 Allergy to seafood: Secondary | ICD-10-CM | POA: Diagnosis not present

## 2017-10-17 DIAGNOSIS — N938 Other specified abnormal uterine and vaginal bleeding: Secondary | ICD-10-CM | POA: Diagnosis not present

## 2017-10-17 HISTORY — DX: Dysphagia, unspecified: R13.10

## 2017-10-17 HISTORY — DX: Phonological disorder: F80.0

## 2017-10-17 LAB — COMPREHENSIVE METABOLIC PANEL
ALT: 101 U/L — ABNORMAL HIGH (ref 14–54)
AST: 66 U/L — ABNORMAL HIGH (ref 15–41)
Albumin: 2.4 g/dL — ABNORMAL LOW (ref 3.5–5.0)
Alkaline Phosphatase: 143 U/L — ABNORMAL HIGH (ref 38–126)
Anion gap: 8 (ref 5–15)
BUN: 27 mg/dL — ABNORMAL HIGH (ref 6–20)
CO2: 25 mmol/L (ref 22–32)
Calcium: 8.2 mg/dL — ABNORMAL LOW (ref 8.9–10.3)
Chloride: 104 mmol/L (ref 101–111)
Creatinine, Ser: 0.85 mg/dL (ref 0.44–1.00)
GFR calc Af Amer: >60 mL/min (ref >60)
GFR calc non Af Amer: >60 mL/min (ref >60)
Glucose, Bld: 173 mg/dL — ABNORMAL HIGH (ref 65–99)
Potassium: 3.4 mmol/L — ABNORMAL LOW (ref 3.5–5.1)
Sodium: 137 mmol/L (ref 135–145)
Total Bilirubin: 1.1 mg/dL (ref 0.3–1.2)
Total Protein: 6.7 g/dL (ref 6.5–8.1)

## 2017-10-17 LAB — CBC
HCT: 36.1 % (ref 36.0–46.0)
Hemoglobin: 11.6 g/dL — ABNORMAL LOW (ref 12.0–15.0)
MCH: 33.3 pg (ref 26.0–34.0)
MCHC: 32.1 g/dL (ref 30.0–36.0)
MCV: 103.7 fL — ABNORMAL HIGH (ref 78.0–100.0)
Platelets: 298 10*3/uL (ref 150–400)
RBC: 3.48 MIL/uL — ABNORMAL LOW (ref 3.87–5.11)
RDW: 13.8 % (ref 11.5–15.5)
WBC: 4.3 10*3/uL (ref 4.0–10.5)

## 2017-10-17 LAB — HEMOGLOBIN A1C
Hgb A1c MFr Bld: 5.7 % — ABNORMAL HIGH (ref 4.8–5.6)
Mean Plasma Glucose: 116.89 mg/dL

## 2017-10-17 LAB — TYPE AND SCREEN
ABO/RH(D): AB POS
Antibody Screen: NEGATIVE

## 2017-10-17 NOTE — Pre-Procedure Instructions (Signed)
Patient is a resident at [facility] and is here for preop visit. She is oriented to person only. She does not know birthdate. Her daughter is her legal guardian, [name]. She did not come for pre op visit. I called Beverly at home to get make sure she understood that patient was here for her pre op visit and that she understood what her mother was having done. She verbalized understanding of both and told me it was alright to proceed with the blood work and Pre op assesment. She will be here the morning of [name] procedure to sign consent. [name] is her next of kin but does not have legal documents stating that she is POA. Spoke with [name] and told her getting legal POA would be in the best interest for her and her mother. She verbalized understanding of this. Medical health history was obtained from written documentation that was already in computer as well as paperwork sent by [place]. Pre op instructions were gone over with [employer] and highlighted in written paperwork.

## 2017-10-18 DIAGNOSIS — I2510 Atherosclerotic heart disease of native coronary artery without angina pectoris: Secondary | ICD-10-CM | POA: Diagnosis not present

## 2017-10-18 DIAGNOSIS — M109 Gout, unspecified: Secondary | ICD-10-CM | POA: Diagnosis not present

## 2017-10-18 DIAGNOSIS — I10 Essential (primary) hypertension: Secondary | ICD-10-CM | POA: Diagnosis not present

## 2017-10-18 LAB — GLUCOSE, CAPILLARY: Glucose-Capillary: 213 mg/dL — ABNORMAL HIGH (ref 65–99)

## 2017-10-19 ENCOUNTER — Encounter (HOSPITAL_COMMUNITY)
Admission: RE | Disposition: A | Discharge status: Home / Self Care | Payer: Self-pay | Source: Ambulatory Visit | Attending: Obstetrics & Gynecology

## 2017-10-19 ENCOUNTER — Ambulatory Visit (HOSPITAL_COMMUNITY): Payer: Medicare HMO | Admitting: Anesthesiology

## 2017-10-19 ENCOUNTER — Ambulatory Visit (HOSPITAL_COMMUNITY)
Admission: RE | Admit: 2017-10-19 | Discharge: 2017-10-19 | Disposition: A | Discharge status: Home / Self Care | Payer: Medicare HMO | Source: Ambulatory Visit | Attending: Obstetrics & Gynecology | Admitting: Obstetrics & Gynecology

## 2017-10-19 ENCOUNTER — Encounter (HOSPITAL_COMMUNITY): Payer: Self-pay | Admitting: *Deleted

## 2017-10-19 DIAGNOSIS — M1990 Unspecified osteoarthritis, unspecified site: Secondary | ICD-10-CM | POA: Diagnosis not present

## 2017-10-19 DIAGNOSIS — E7800 Pure hypercholesterolemia, unspecified: Secondary | ICD-10-CM | POA: Insufficient documentation

## 2017-10-19 DIAGNOSIS — I10 Essential (primary) hypertension: Secondary | ICD-10-CM | POA: Diagnosis not present

## 2017-10-19 DIAGNOSIS — D649 Anemia, unspecified: Secondary | ICD-10-CM | POA: Diagnosis not present

## 2017-10-19 DIAGNOSIS — M549 Dorsalgia, unspecified: Secondary | ICD-10-CM | POA: Diagnosis not present

## 2017-10-19 DIAGNOSIS — N8500 Endometrial hyperplasia, unspecified: Secondary | ICD-10-CM | POA: Diagnosis not present

## 2017-10-19 DIAGNOSIS — N938 Other specified abnormal uterine and vaginal bleeding: Secondary | ICD-10-CM | POA: Diagnosis not present

## 2017-10-19 DIAGNOSIS — E119 Type 2 diabetes mellitus without complications: Secondary | ICD-10-CM | POA: Diagnosis not present

## 2017-10-19 DIAGNOSIS — N3941 Urge incontinence: Secondary | ICD-10-CM | POA: Insufficient documentation

## 2017-10-19 DIAGNOSIS — Z91013 Allergy to seafood: Secondary | ICD-10-CM | POA: Insufficient documentation

## 2017-10-19 DIAGNOSIS — G8929 Other chronic pain: Secondary | ICD-10-CM | POA: Insufficient documentation

## 2017-10-19 DIAGNOSIS — Z888 Allergy status to other drugs, medicaments and biological substances status: Secondary | ICD-10-CM | POA: Insufficient documentation

## 2017-10-19 DIAGNOSIS — Z87891 Personal history of nicotine dependence: Secondary | ICD-10-CM | POA: Insufficient documentation

## 2017-10-19 DIAGNOSIS — H9190 Unspecified hearing loss, unspecified ear: Secondary | ICD-10-CM | POA: Insufficient documentation

## 2017-10-19 DIAGNOSIS — I480 Paroxysmal atrial fibrillation: Secondary | ICD-10-CM | POA: Insufficient documentation

## 2017-10-19 DIAGNOSIS — N950 Postmenopausal bleeding: Secondary | ICD-10-CM | POA: Diagnosis not present

## 2017-10-19 DIAGNOSIS — I69354 Hemiplegia and hemiparesis following cerebral infarction affecting left non-dominant side: Secondary | ICD-10-CM | POA: Insufficient documentation

## 2017-10-19 DIAGNOSIS — M25569 Pain in unspecified knee: Secondary | ICD-10-CM | POA: Diagnosis not present

## 2017-10-19 LAB — GLUCOSE, CAPILLARY
Glucose-Capillary: 96 mg/dL (ref 65–99)
Glucose-Capillary: 98 mg/dL (ref 65–99)

## 2017-10-19 SURGERY — EXAM UNDER ANESTHESIA
Anesthesia: General | Site: Vagina

## 2017-10-19 MED ORDER — KETOROLAC TROMETHAMINE 10 MG PO TABS: 10.0000 mg | ORAL_TABLET | Freq: Three times a day (TID) | ORAL | 0 refills | Status: DC | PRN

## 2017-10-19 MED ORDER — PROPOFOL 10 MG/ML IV BOLUS
INTRAVENOUS | Status: DC | PRN
  Administered 2017-10-19: 100 mg via INTRAVENOUS

## 2017-10-19 MED ORDER — LACTATED RINGERS IV SOLN
INTRAVENOUS | Status: DC
  Administered 2017-10-19: 09:00:00 via INTRAVENOUS

## 2017-10-19 MED ORDER — CEFAZOLIN SODIUM-DEXTROSE 2-4 GM/100ML-% IV SOLN
INTRAVENOUS | Status: AC
  Filled 2017-10-19: qty 100

## 2017-10-19 MED ORDER — FENTANYL CITRATE (PF) 100 MCG/2ML IJ SOLN: 25.0000 ug | INTRAMUSCULAR | Status: DC | PRN

## 2017-10-19 MED ORDER — SODIUM CHLORIDE 0.9 % IR SOLN
Status: DC | PRN
  Administered 2017-10-19: 3000 mL

## 2017-10-19 MED ORDER — FENTANYL CITRATE (PF) 100 MCG/2ML IJ SOLN
INTRAMUSCULAR | Status: AC
  Filled 2017-10-19: qty 2

## 2017-10-19 MED ORDER — SODIUM CHLORIDE 0.9 % IR SOLN
Status: DC | PRN
  Administered 2017-10-19: 1000 mL

## 2017-10-19 MED ORDER — FERRIC SUBSULFATE 259 MG/GM EX SOLN
CUTANEOUS | Status: DC | PRN
  Administered 2017-10-19: 1

## 2017-10-19 MED ORDER — FERRIC SUBSULFATE SOLN
Status: DC | PRN
  Administered 2017-10-19: 1

## 2017-10-19 MED ORDER — ONDANSETRON 8 MG PO TBDP: 8.0000 mg | ORAL_TABLET | Freq: Three times a day (TID) | ORAL | 0 refills | Status: DC | PRN

## 2017-10-19 MED ORDER — PHENYLEPHRINE 40 MCG/ML (10ML) SYRINGE FOR IV PUSH (FOR BLOOD PRESSURE SUPPORT)
PREFILLED_SYRINGE | INTRAVENOUS | Status: AC
  Filled 2017-10-19: qty 10

## 2017-10-19 MED ORDER — MIDAZOLAM HCL 2 MG/2ML IJ SOLN
INTRAMUSCULAR | Status: AC
  Filled 2017-10-19: qty 2

## 2017-10-19 MED ORDER — KETOROLAC TROMETHAMINE 30 MG/ML IJ SOLN
INTRAMUSCULAR | Status: AC
  Filled 2017-10-19: qty 1

## 2017-10-19 MED ORDER — PHENYLEPHRINE HCL 10 MG/ML IJ SOLN
INTRAMUSCULAR | Status: DC | PRN
  Administered 2017-10-19: 80 ug via INTRAVENOUS
  Administered 2017-10-19: 40 ug via INTRAVENOUS

## 2017-10-19 MED ORDER — MIDAZOLAM HCL 2 MG/2ML IJ SOLN
1.0000 mg | INTRAMUSCULAR | Status: AC
  Administered 2017-10-19: 2 mg via INTRAVENOUS

## 2017-10-19 MED ORDER — FENTANYL CITRATE (PF) 100 MCG/2ML IJ SOLN
INTRAMUSCULAR | Status: DC | PRN
  Administered 2017-10-19 (×2): 25 ug via INTRAVENOUS

## 2017-10-19 MED ORDER — FERRIC SUBSULFATE 259 MG/GM EX SOLN
CUTANEOUS | Status: AC
  Filled 2017-10-19: qty 8

## 2017-10-19 MED ORDER — PROPOFOL 10 MG/ML IV BOLUS
INTRAVENOUS | Status: AC
  Filled 2017-10-19: qty 40

## 2017-10-19 MED ORDER — CEFAZOLIN SODIUM-DEXTROSE 2-4 GM/100ML-% IV SOLN
2.0000 g | INTRAVENOUS | Status: AC
  Administered 2017-10-19: 2 g via INTRAVENOUS

## 2017-10-19 MED ORDER — KETOROLAC TROMETHAMINE 30 MG/ML IJ SOLN
30.0000 mg | Freq: Once | INTRAMUSCULAR | Status: AC
  Administered 2017-10-19: 30 mg via INTRAVENOUS

## 2017-10-19 SURGICAL SUPPLY — 28 items
BAG HAMPER (MISCELLANEOUS) ×4 IMPLANT
CLOTH BEACON ORANGE TIMEOUT ST (SAFETY) ×4 IMPLANT
COVER LIGHT HANDLE STERIS (MISCELLANEOUS) ×8 IMPLANT
DRAPE PROXIMA HALF (DRAPES) ×4 IMPLANT
FORMALIN 10 PREFIL 120ML (MISCELLANEOUS) ×4 IMPLANT
GAUZE SPONGE 4X4 16PLY XRAY LF (GAUZE/BANDAGES/DRESSINGS) ×4 IMPLANT
GLOVE BIOGEL PI IND STRL 7.0 (GLOVE) ×4 IMPLANT
GLOVE BIOGEL PI IND STRL 8 (GLOVE) ×2 IMPLANT
GLOVE BIOGEL PI INDICATOR 7.0 (GLOVE) ×4
GLOVE BIOGEL PI INDICATOR 8 (GLOVE) ×2
GLOVE ECLIPSE 8.0 STRL XLNG CF (GLOVE) ×4 IMPLANT
GOWN STRL REUS W/TWL LRG LVL3 (GOWN DISPOSABLE) ×4 IMPLANT
GOWN STRL REUS W/TWL XL LVL3 (GOWN DISPOSABLE) ×4 IMPLANT
INST SET HYSTEROSCOPY (KITS) ×4 IMPLANT
IV NS 1000ML (IV SOLUTION) ×2
IV NS 1000ML BAXH (IV SOLUTION) ×2 IMPLANT
IV NS IRRIG 3000ML ARTHROMATIC (IV SOLUTION) ×4 IMPLANT
KIT ROOM TURNOVER AP CYSTO (KITS) ×4 IMPLANT
MANIFOLD NEPTUNE II (INSTRUMENTS) ×4 IMPLANT
NS IRRIG 1000ML POUR BTL (IV SOLUTION) ×4 IMPLANT
PACK BASIC III (CUSTOM PROCEDURE TRAY) ×2
PACK SRG BSC III STRL LF ECLPS (CUSTOM PROCEDURE TRAY) ×2 IMPLANT
PAD ARMBOARD 7.5X6 YLW CONV (MISCELLANEOUS) ×4 IMPLANT
PAD TELFA 3X4 1S STER (GAUZE/BANDAGES/DRESSINGS) ×4 IMPLANT
SET BASIN LINEN APH (SET/KITS/TRAYS/PACK) ×4 IMPLANT
SET IRRIG Y TYPE TUR BLADDER L (SET/KITS/TRAYS/PACK) ×4 IMPLANT
SHEET LAVH (DRAPES) ×4 IMPLANT
YANKAUER SUCT BULB TIP 10FT TU (MISCELLANEOUS) ×4 IMPLANT

## 2017-10-19 NOTE — Anesthesia Postprocedure Evaluation (Signed)
Anesthesia Post Note  Patient: [name]  Procedure(s) Performed: EXAM UNDER ANESTHESIA; failed hysteroscopy and dilatation and curettage  (N/A Vagina )  Patient location during evaluation: Short Stay Anesthesia Type: General Level of consciousness: awake and alert Pain management: satisfactory to patient Vital Signs Assessment: post-procedure vital signs reviewed and stable Respiratory status: spontaneous breathing Cardiovascular status: stable Postop Assessment: no apparent nausea or vomiting Anesthetic complications: no     Last Vitals:  Vitals:   10/19/17 1020 10/19/17 1045  BP: 123/73 140/80  Pulse: (!) 51 95  Resp: (!) 24 (!) 28  Temp: 36.6 C   SpO2: 97% 96%    Last Pain:  Vitals:   10/19/17 1045  TempSrc:   PainSc: 0-No pain                 Darsha Zumstein

## 2017-10-19 NOTE — Transfer of Care (Signed)
Immediate Anesthesia Transfer of Care Note  Patient: [name]  Procedure(s) Performed: Jasmine December UNDER ANESTHESIA; failed hysteroscopy  (N/A Vagina )  Patient Location: PACU  Anesthesia Type:General  Level of Consciousness: awake and patient cooperative  Airway & Oxygen Therapy: Patient Spontanous Breathing and Patient connected to nasal cannula oxygen  Post-op Assessment: Report given to RN and Post -op Vital signs reviewed and stable  Post vital signs: Reviewed and stable  Last Vitals:  Vitals:   10/19/17 0849 10/19/17 0900  BP: 139/82 133/78  Pulse: 92   Resp: 16 (!) 23  Temp: (!) 36.3 C   SpO2: 98% 100%    Last Pain:  Vitals:   10/19/17 0849  TempSrc: Oral  PainSc: 0-No pain         Complications: No apparent anesthesia complications

## 2017-10-19 NOTE — Discharge Instructions (Signed)
Dilation and Curettage or Vacuum Curettage, Care After  These instructions give you information about caring for yourself after your procedure. Your doctor may also give you more specific instructions. Call your doctor if you have any problems or questions after your procedure.  Follow these instructions at home:  Activity   · Do not drive or use heavy machinery while taking prescription pain medicine.  · For 24 hours after your procedure, avoid driving.  · Take short walks often, followed by rest periods. Ask your doctor what activities are safe for you. After one or two days, you may be able to return to your normal activities.  · Do not lift anything that is heavier than 10 lb (4.5 kg) until your doctor approves.  · For at least 2 weeks, or as long as told by your doctor:  ? Do not douche.  ? Do not use tampons.  ? Do not have sex.  General instructions   · Take over-the-counter and prescription medicines only as told by your doctor. This is very important if you take blood thinning medicine.  · Do not take baths, swim, or use a hot tub until your doctor approves. Take showers instead of baths.  · Wear compression stockings as told by your doctor.  · It is up to you to get the results of your procedure. Ask your doctor when your results will be ready.  · Keep all follow-up visits as told by your doctor. This is important.  Contact a doctor if:  · You have very bad cramps that get worse or do not get better with medicine.  · You have very bad pain in your belly (abdomen).  · You cannot drink fluids without throwing up (vomiting).  · You get pain in a different part of the area between your belly and thighs (pelvis).  · You have bad-smelling discharge from your vagina.  · You have a rash.  Get help right away if:  · You are bleeding a lot from your vagina. A lot of bleeding means soaking more than one sanitary pad in an hour, for 2 hours in a row.  · You have clumps of blood (blood clots) coming from your  vagina.  · You have a fever or chills.  · Your belly feels very tender or hard.  · You have chest pain.  · You have trouble breathing.  · You cough up blood.  · You feel dizzy.  · You feel light-headed.  · You pass out (faint).  · You have pain in your neck or shoulder area.  Summary  · Take short walks often, followed by rest periods. Ask your doctor what activities are safe for you. After one or two days, you may be able to return to your normal activities.  · Do not lift anything that is heavier than 10 lb (4.5 kg) until your doctor approves.  · Do not take baths, swim, or use a hot tub until your doctor approves. Take showers instead of baths.  · Contact your doctor if you have any symptoms of infection, like bad-smelling discharge from your vagina.  This information is not intended to replace advice given to you by your health care provider. Make sure you discuss any questions you have with your health care provider.  Document Released: 08/31/2008 Document Revised: 08/09/2016 Document Reviewed: 08/09/2016  Elsevier Interactive Patient Education © 2017 Elsevier Inc.

## 2017-10-19 NOTE — H&P (Signed)
Preoperative History and Physical  [name] is a 76 y.o. [phone] with Patient's last menstrual period was 03/31/2013. admitted for a hysteroscopy uterine curettage endometrial.  Pt is known to have simple and complex endomettrial hyperplasia and is being managed with an IUD She presented with PMB and her ES is thickened at 18 mm which is probaby decidual/progestational effect.  Although unusual I am going to perform an endometrial ablation to attempt to stop subside her PMB. I understand this may make it more difficult to assess her endometrium if issues develop going forward but she is not a hysterectomy candidate anyway and any bleeding will have to be managed either with systemic progesterone or even pelvic radiation so I feel this is a better solution in this clinical setting  PMH:    Past Medical History:  Diagnosis Date  . Anemia   . Arthritis    osteoarthritis  . Asthma   . Back pain, chronic   . Chronic knee pain   . Diabetes mellitus   . Dysphagia   . Dysrhythmia    AFib  . HOH (hard of hearing)   . Hyperlipidemia   . Hypertension   . Obesity   . Phonological disorder   . PMB (postmenopausal bleeding) 11/26/2014  . Stroke [facility])    Mini Stroke; nleft sided hemiplegia  . Thickened endometrium 01/15/2015  . TIA (transient ischemic attack)   . Urge incontinence 11/26/2014  . Vertigo     PSH:     Past Surgical History:  Procedure Laterality Date  . APPENDECTOMY    . colonoscopy  2009   Dr. Wilford Corner: 4 hyperplastic polyps removed. Small internal hemorrhoids. Recommended 5 year follow-up colonoscopy.  . ENDOMETRIAL ABLATION    . ENDOMETRIAL BIOPSY  04/03/2013      . OVARY SURGERY      POb/GynH:      OB History    Gravida Para Term Preterm AB Living   2 2 2     2    SAB TAB Ectopic Multiple Live Births           2      SH:   Social History   Tobacco Use  . Smoking status: Former Smoker    Packs/day: 0.25    Years: 30.00    Pack years: 7.50     Types: Cigarettes    Last attempt to quit: 05/20/2015    Years since quitting: 2.4  . Smokeless tobacco: Never Used  Substance Use Topics  . Alcohol use: No    Alcohol/week: 0.0 oz  . Drug use: No    FH:    Family History  Problem Relation Age of Onset  . Arrhythmia Father        Atrial fib/Pacemaker  . Heart failure Mother   . Diabetes Mother   . Other Mother        fell and broke hip  . Diabetes Sister   . Lupus Daughter   . Mental illness Son   . ADD / ADHD Son   . Other Son        soft bones  . Diabetes Maternal Grandmother   . Stroke Paternal Grandmother   . Arthritis Paternal Grandfather   . Other Sister        MVA  . Hypertension Brother   . Diabetes Sister   . Hypertension Sister   . Colon cancer Neg Hx      Allergies:  Allergies  Allergen Reactions  .  Imitrex [Sumatriptan] Other (See Comments)    Unknown Reaction   . Mango Flavor Other (See Comments)    Nausea and vomiting  . Oysters [Shellfish Allergy] Other (See Comments)    Nausea and vomiting    Medications:       Current Facility-Administered Medications:  .  ceFAZolin (ANCEF) IVPB 2g/100 mL premix, 2 g, Intravenous, On Call to OR, Florian Buff, MD .  lactated ringers infusion, , Intravenous, Continuous, Lerry Liner, MD, Last Rate: 75 mL/hr at 10/19/17 0902 .  midazolam (VERSED) injection 1-2 mg, 1-2 mg, Intravenous, Q5 min, Lerry Liner, MD, 2 mg at 10/19/17 3295  Review of Systems:   Review of Systems  Constitutional: Negative for fever, chills, weight loss, malaise/fatigue and diaphoresis.  HENT: Negative for hearing loss, ear pain, nosebleeds, congestion, sore throat, neck pain, tinnitus and ear discharge.   Eyes: Negative for blurred vision, double vision, photophobia, pain, discharge and redness.  Respiratory: Negative for cough, hemoptysis, sputum production, shortness of breath, wheezing and stridor.   Cardiovascular: Negative for chest pain, palpitations, orthopnea,  claudication, leg swelling and PND.  Gastrointestinal: Positive for abdominal pain. Negative for heartburn, nausea, vomiting, diarrhea, constipation, blood in stool and melena.  Genitourinary: Negative for dysuria, urgency, frequency, hematuria and flank pain.  Musculoskeletal: Negative for myalgias, back pain, joint pain and falls.  Skin: Negative for itching and rash.  Neurological: Negative for dizziness, tingling, tremors, sensory change, speech change, focal weakness, seizures, loss of consciousness, weakness and headaches.  Endo/Heme/Allergies: Negative for environmental allergies and polydipsia. Does not bruise/bleed easily.  Psychiatric/Behavioral: Negative for depression, suicidal ideas, hallucinations, memory loss and substance abuse. The patient is not nervous/anxious and does not have insomnia.      PHYSICAL EXAM:  Blood pressure 133/78, pulse 92, temperature (!) 97.4 F (36.3 C), temperature source Oral, resp. rate (!) 23, last menstrual period 03/31/2013, SpO2 100 %.    Vitals reviewed. Constitutional: She is oriented to person, place, and time. She appears well-developed and well-nourished.  HENT:  Head: Normocephalic and atraumatic.  Right Ear: External ear normal.  Left Ear: External ear normal.  Nose: Nose normal.  Mouth/Throat: Oropharynx is clear and moist.  Eyes: Conjunctivae and EOM are normal. Pupils are equal, round, and reactive to light. Right eye exhibits no discharge. Left eye exhibits no discharge. No scleral icterus.  Neck: Normal range of motion. Neck supple. No tracheal deviation present. No thyromegaly present.  Cardiovascular: Normal rate, regular rhythm, normal heart sounds and intact distal pulses.  Exam reveals no gallop and no friction rub.   No murmur heard. Respiratory: Effort normal and breath sounds normal. No respiratory distress. She has no wheezes. She has no rales. She exhibits no tenderness.  GI: Soft. Bowel sounds are normal. She exhibits  no distension and no mass. There is tenderness. There is no rebound and no guarding.  Genitourinary:       Vulva is normal without lesions Vagina is pink moist without discharge Cervix normal in appearance and pap is normal Uterus is per sonogram Adnexa is negative with normal sized ovaries by sonogram  Musculoskeletal: Normal range of motion. She exhibits no edema and no tenderness.  Neurological: She is alert and oriented to person, place, and time. She has normal reflexes. She displays normal reflexes. No cranial nerve deficit. She exhibits normal muscle tone. Coordination normal.  Skin: Skin is warm and dry. No rash noted. No erythema. No pallor.  Psychiatric: She has a normal mood and affect. Her behavior  is normal. Judgment and thought content normal.    Labs: Results for orders placed or performed during the hospital encounter of 10/19/17 (from the past 336 hour(s))  Glucose, capillary   Collection Time: 10/19/17  8:46 AM  Result Value Ref Range   Glucose-Capillary 96 65 - 99 mg/dL  Results for orders placed or performed during the hospital encounter of 10/17/17 (from the past 336 hour(s))  Glucose, capillary   Collection Time: 10/17/17  2:13 PM  Result Value Ref Range   Glucose-Capillary 213 (H) 65 - 99 mg/dL  CBC   Collection Time: 10/17/17  2:24 PM  Result Value Ref Range   WBC 4.3 4.0 - 10.5 K/uL   RBC 3.48 (L) 3.87 - 5.11 MIL/uL   Hemoglobin 11.6 (L) 12.0 - 15.0 g/dL   HCT 36.1 36.0 - 46.0 %   MCV 103.7 (H) 78.0 - 100.0 fL   MCH 33.3 26.0 - 34.0 pg   MCHC 32.1 30.0 - 36.0 g/dL   RDW 13.8 11.5 - 15.5 %   Platelets 298 150 - 400 K/uL  Comprehensive metabolic panel   Collection Time: 10/17/17  2:24 PM  Result Value Ref Range   Sodium 137 135 - 145 mmol/L   Potassium 3.4 (L) 3.5 - 5.1 mmol/L   Chloride 104 101 - 111 mmol/L   CO2 25 22 - 32 mmol/L   Glucose, Bld 173 (H) 65 - 99 mg/dL   BUN 27 (H) 6 - 20 mg/dL   Creatinine, Ser 0.85 0.44 - 1.00 mg/dL   Calcium 8.2  (L) 8.9 - 10.3 mg/dL   Total Protein 6.7 6.5 - 8.1 g/dL   Albumin 2.4 (L) 3.5 - 5.0 g/dL   AST 66 (H) 15 - 41 U/L   ALT 101 (H) 14 - 54 U/L   Alkaline Phosphatase 143 (H) 38 - 126 U/L   Total Bilirubin 1.1 0.3 - 1.2 mg/dL   GFR calc non Af Amer >60 >60 mL/min   GFR calc Af Amer >60 >60 mL/min   Anion gap 8 5 - 15  Hemoglobin A1c   Collection Time: 10/17/17  2:24 PM  Result Value Ref Range   Hgb A1c MFr Bld 5.7 (H) 4.8 - 5.6 %   Mean Plasma Glucose 116.89 mg/dL  Type and screen   Collection Time: 10/17/17  2:24 PM  Result Value Ref Range   ABO/RH(D) AB POS    Antibody Screen NEG    Sample Expiration 10/20/2017     EKG: Orders placed or performed during the hospital encounter of 10/17/17  . EKG 12-Lead  . EKG 12-Lead    Imaging Studies: US Pelvis Transvanginal Non-ob (tv Only)  Result Date: 09/27/2017 CLINICAL DATA:  Initial evaluation for postmenopausal bleeding. EXAM: TRANSABDOMINAL AND TRANSVAGINAL ULTRASOUND OF PELVIS TECHNIQUE: Both transabdominal and transvaginal ultrasound examinations of the pelvis were performed. Transabdominal technique was performed for global imaging of the pelvis including uterus, ovaries, adnexal regions, and pelvic cul-de-sac. It was necessary to proceed with endovaginal exam following the transabdominal exam to visualize the uterus and ovaries. COMPARISON:  Prior ultrasound from 01/13/2015. FINDINGS: Examination technically limited by body habitus and patient's inability to position. Uterus Poorly visualized, measuring approximately 6.5 x 3.1 cm No obvious fibroids or other mass visualized. Endometrium Thickness: 18 mm. Node definite discrete lesion or other abnormality. Right ovary Not visualized.  No obvious adnexal mass. Left ovary Not visualized.  No obvious adnexal mass. Other findings No abnormal free fluid. IMPRESSION: 1. Technically limited exam due  to patient positioning and difficulty positioning. 2. Thickened endometrium up to 18 mm, abnormal  in this postmenopausal patient. In the setting of post-menopausal bleeding, endometrial sampling is indicated to exclude carcinoma. If results are benign, sonohysterogram should be considered for focal lesion work-up. (Ref: Radiological Reasoning: Algorithmic Workup of Abnormal Vaginal Bleeding with Endovaginal Sonography and Sonohysterography. AJR 2008; 626:R48-54). 3. Nonvisualization of the ovaries. No other obvious acute abnormality within the pelvis. Electronically Signed   By: Jeannine Boga M.D.   On: 09/27/2017 16:16   US Pelvis (transabdominal Only)  Result Date: 09/27/2017 CLINICAL DATA:  Initial evaluation for postmenopausal bleeding. EXAM: TRANSABDOMINAL AND TRANSVAGINAL ULTRASOUND OF PELVIS TECHNIQUE: Both transabdominal and transvaginal ultrasound examinations of the pelvis were performed. Transabdominal technique was performed for global imaging of the pelvis including uterus, ovaries, adnexal regions, and pelvic cul-de-sac. It was necessary to proceed with endovaginal exam following the transabdominal exam to visualize the uterus and ovaries. COMPARISON:  Prior ultrasound from 01/13/2015. FINDINGS: Examination technically limited by body habitus and patient's inability to position. Uterus Poorly visualized, measuring approximately 6.5 x 3.1 cm No obvious fibroids or other mass visualized. Endometrium Thickness: 18 mm. Node definite discrete lesion or other abnormality. Right ovary Not visualized.  No obvious adnexal mass. Left ovary Not visualized.  No obvious adnexal mass. Other findings No abnormal free fluid. IMPRESSION: 1. Technically limited exam due to patient positioning and difficulty positioning. 2. Thickened endometrium up to 18 mm, abnormal in this postmenopausal patient. In the setting of post-menopausal bleeding, endometrial sampling is indicated to exclude carcinoma. If results are benign, sonohysterogram should be considered for focal lesion work-up. (Ref: Radiological  Reasoning: Algorithmic Workup of Abnormal Vaginal Bleeding with Endovaginal Sonography and Sonohysterography. AJR 2008; [id number]). 3. Nonvisualization of the ovaries. No other obvious acute abnormality within the pelvis. Electronically Signed   By: Jeannine Boga M.D.   On: 09/27/2017 16:16      Assessment: Endometrial hyperplasia, complex and simple Continued post menopausal bleeding due to chronic anticoagulation  Patient Active Problem List   Diagnosis Date Noted  . Stroke (cerebrum) ([place]) 04/20/2016  . Slurred speech 04/20/2016  . Right knee pain 01/21/2016  . Spastic hemiplegia affecting nondominant side ([place]) 09/19/2015  . Type 2 diabetes mellitus with other circulatory complications ([place]) 93/[id number]  . Essential hypertension 07/29/2015  . HLD (hyperlipidemia) 07/29/2015  . Complex regional pain syndrome I of upper limb 06/27/2015  . Hemiparesis affecting left side as late effect of cerebrovascular accident ([place]) 06/27/2015  . 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 Embolic cerebral infarction ([place]) 05/14/2015  . Cerebral infarction due to unspecified mechanism   . Paroxysmal atrial fibrillation ([facility])   . Stroke ([place])   . Postmenopausal bleeding   . Left-sided weakness 05/10/2015  . CVA (cerebral vascular accident) ([place]) 05/10/2015  . Complex endometrial hyperplasia without atypia 02/11/2015  . Simple endometrial hyperplasia without atypia 02/11/2015  . Change in bowel habits 02/07/2015  . Thickened endometrium 01/15/2015  . PMB (postmenopausal bleeding) 11/26/2014  . Urge incontinence 11/26/2014  . Cerebral embolism with cerebral infarction ([place]) 07/27/2014  . Left leg weakness 07/26/2014  . Right frontal lobe lesion 07/26/2014  . Hyperlipidemia 03/13/2013  . Sleep apnea 03/13/2013  . New onset atrial fibrillation ([place]) 03/13/2013  . Near syncope 03/13/2013  . Atrial fibrillation ([place]) 03/12/2013  .  HYPERCHOLESTEROLEMIA 05/09/2009  . SINUSITIS 05/09/2009  . BUNION, RIGHT FOOT 05/09/2009  . SHINGLES 11/07/2008  . SUPRASPINATUS TENDINITIS 08/28/2008  .  DIABETES MELLITUS, CONTROLLED 08/21/2008  . SHOULDER PAIN, LEFT 08/21/2008  . SPRAIN AND STRAIN OF STERNOCLAVICULAR 07/04/2008  . OBESITY, MORBID 02/02/2008  . PROTEINURIA 02/02/2008  . DM (diabetes mellitus), type 2 with complications ([place]) [id number]/65/0354  . ANEMIA 12/20/2007  . Essential hypertension, benign 12/20/2007  . ASTHMA 12/20/2007  . Unspecified arthropathy, lower leg 12/20/2007    Plan: Hysteroscopy uterine curettage endometrial ablation to try to manage her PMB due to chronic anticoagulation leading to PMB  Tekelia Kareem H 10/19/2017 9:08 AM

## 2017-10-19 NOTE — Op Note (Signed)
Preoperative diagnosis: Postmenopausal bleeding                                         Thickened endometrium   Postoperative diagnosis: Unable to perform a hysteroscopy much less a uterine sampling  Procedure: Exam under anesthesia   Surgeon: Florian Buff   Anesthesia: General endotracheal  Findings: This patient has a history of simple and complex endometrial hyperplasia and is been managed with an intrauterine device. However she began experiencing postmenopausal bleeding once again and my plan in order to continue to facilitate no vaginal bleeding in this patient with complex medical problems to do a hysteroscopy D&C and endometrial ablation just for the management of bleeding and continue to use the intrauterine device for ongoing management of possible hyperplasia However my instruments would not to reach into her cervix and I was unable really to do anything I have planned today Additionally I could even see the IUD strings As a result I abandon the planned procedure suggested an exam under anesthesia and patient was awakened from anesthesia and taken recovery room The family was advised of this and knows the only way to continue to manage her is with megestrol therapy along with the IUD  Description of operation: Patient was taken to the operating room and placed in the supine position where she underwent laryngeal mask airway anesthesia. She was placed in the dorsal lithotomy position.  She was prepped and draped in the usual sterile fashion.  A Graves speculum was placed.  The cervix was grasped with a single-tooth tenaculum.  Unfortunately I was unable to reach her cervix with the Hegar dilators and much less the hysteroscope  The hysteroscope itself would not even region to her uterus or even to the level of her cervix As a result I had to abandon the procedure at this point  she experienced minimal blood loss  She was taken to the recovery room in good stable condition  All  counts were correct x3  She received 2 g of Ancef and 30 mg of Toradol preoperatively

## 2017-10-19 NOTE — Anesthesia Procedure Notes (Signed)
Procedure Name: LMA Insertion Date/Time: 10/19/2017 9:37 AM Performed by: Vista Deck, CRNA Pre-anesthesia Checklist: Patient identified, Patient being monitored, Emergency Drugs available, Timeout performed and Suction available Patient Re-evaluated:Patient Re-evaluated prior to induction Oxygen Delivery Method: Circle System Utilized Preoxygenation: Pre-oxygenation with 100% oxygen Induction Type: IV induction Ventilation: Mask ventilation without difficulty LMA: LMA inserted LMA Size: 4.0 Number of attempts: 1 Placement Confirmation: positive ETCO2 and breath sounds checked- equal and bilateral Tube secured with: Tape Dental Injury: Teeth and Oropharynx as per pre-operative assessment

## 2017-10-19 NOTE — Anesthesia Preprocedure Evaluation (Signed)
Anesthesia Evaluation  Patient identified by MRN, date of birth, ID band Patient awake    Reviewed: Allergy & Precautions, H&P , NPO status , Patient's Chart, lab work & pertinent test results  Airway Mallampati: IV  TM Distance: >3 FB Neck ROM: Full    Dental  (+) Edentulous Upper   Pulmonary asthma , sleep apnea , Current Smoker, former smoker,     + decreased breath sounds      Cardiovascular hypertension, Pt. on medications + Peripheral Vascular Disease  + dysrhythmias  Rhythm:Regular Rate:Normal     Neuro/Psych TIACVA    GI/Hepatic neg GERD  ,  Endo/Other  diabetes, Type 2, Oral Hypoglycemic AgentsMorbid obesity  Renal/GU      Musculoskeletal   Abdominal   Peds  Hematology   Anesthesia Other Findings   Reproductive/Obstetrics                             Anesthesia Physical Anesthesia Plan  ASA: III  Anesthesia Plan: General   Post-op Pain Management:    Induction: Intravenous  PONV Risk Score and Plan:   Airway Management Planned: LMA  Additional Equipment:   Intra-op Plan:   Post-operative Plan: Extubation in OR  Informed Consent: I have reviewed the patients History and Physical, chart, labs and discussed the procedure including the risks, benefits and alternatives for the proposed anesthesia with the patient or authorized representative who has indicated his/her understanding and acceptance.     Plan Discussed with:   Anesthesia Plan Comments:         Anesthesia Quick Evaluation

## 2017-10-20 DIAGNOSIS — F22 Delusional disorders: Secondary | ICD-10-CM | POA: Diagnosis not present

## 2017-10-20 DIAGNOSIS — F0390 Unspecified dementia without behavioral disturbance: Secondary | ICD-10-CM | POA: Diagnosis not present

## 2017-10-20 DIAGNOSIS — N939 Abnormal uterine and vaginal bleeding, unspecified: Secondary | ICD-10-CM | POA: Diagnosis not present

## 2017-10-25 DIAGNOSIS — F3189 Other bipolar disorder: Secondary | ICD-10-CM | POA: Diagnosis not present

## 2017-10-25 DIAGNOSIS — F603 Borderline personality disorder: Secondary | ICD-10-CM | POA: Diagnosis not present

## 2017-10-26 ENCOUNTER — Ambulatory Visit (INDEPENDENT_AMBULATORY_CARE_PROVIDER_SITE_OTHER): Payer: Medicare HMO | Admitting: Obstetrics & Gynecology

## 2017-10-26 ENCOUNTER — Encounter: Payer: Self-pay | Admitting: Obstetrics & Gynecology

## 2017-10-26 VITALS — BP 120/80

## 2017-10-26 DIAGNOSIS — F0150 Vascular dementia without behavioral disturbance: Secondary | ICD-10-CM | POA: Diagnosis not present

## 2017-10-26 DIAGNOSIS — Z9889 Other specified postprocedural states: Secondary | ICD-10-CM

## 2017-10-26 DIAGNOSIS — N3001 Acute cystitis with hematuria: Secondary | ICD-10-CM | POA: Diagnosis not present

## 2017-10-26 DIAGNOSIS — Z7189 Other specified counseling: Secondary | ICD-10-CM | POA: Diagnosis not present

## 2017-10-26 DIAGNOSIS — D649 Anemia, unspecified: Secondary | ICD-10-CM | POA: Diagnosis not present

## 2017-10-26 DIAGNOSIS — N939 Abnormal uterine and vaginal bleeding, unspecified: Secondary | ICD-10-CM | POA: Diagnosis not present

## 2017-10-26 MED ORDER — SULFAMETHOXAZOLE-TRIMETHOPRIM 800-160 MG PO TABS
1.0000 | ORAL_TABLET | Freq: Two times a day (BID) | ORAL | 0 refills | Status: DC
Start: 2017-10-26 — End: 2018-08-18

## 2017-10-26 MED ORDER — MEGESTROL ACETATE 40 MG/ML PO SUSP: 40.0000 mg | Freq: Every day | ORAL | 11 refills | Status: AC

## 2017-10-26 NOTE — Progress Notes (Signed)
  HPI: Patient returns for routine postoperative follow-up having undergone exam under anesthesia on 10/19/2017.  We had planned to do a D&C endometrial ablation because of postmenopausal bleeding however were unable unable to perform that The patient's immediate postoperative recovery has been unremarkable. Since hospital discharge the patient reports she is having some discomfort and burning with urination.   Current Outpatient Medications: acetaminophen (TYLENOL) 325 MG tablet, Take 650 mg every 6 (six) hours as needed by mouth for mild pain or fever. , Disp: , Rfl:  allopurinol (ZYLOPRIM) 100 MG tablet, Take 100 mg by mouth daily., Disp: , Rfl:  diltiazem (CARDIZEM) 30 MG tablet, Take 30 mg 2 (two) times daily by mouth., Disp: , Rfl:  guaifenesin (HUMIBID E) 400 MG TABS tablet, Take 400 mg every 12 (twelve) hours by mouth., Disp: , Rfl:  guaiFENesin-dextromethorphan (ROBITUSSIN DM) 100-10 MG/5ML syrup, Take 5 mLs by mouth every 4 (four) hours as needed for cough., Disp: , Rfl:  ketorolac (TORADOL) 10 MG tablet, Take 1 tablet (10 mg total) every 8 (eight) hours as needed by mouth., Disp: 15 tablet, Rfl: 0 megestrol (MEGACE) 40 MG/ML suspension, Take 1 mL (40 mg total) by mouth daily., Disp: 240 mL, Rfl: 11 Melatonin 5 MG CAPS, Take 5 mg at bedtime by mouth., Disp: , Rfl:  OXYGEN, Inhale 2 L as needed into the lungs (via nasal cannula for shortness of breath or O2 < 92%)., Disp: , Rfl:  scopolamine (TRANSDERM-SCOP) 1 MG/3DAYS, Place 1 patch onto the skin every 3 (three) days., Disp: , Rfl:  senna-docusate (SENNA PLUS) 8.6-50 MG tablet, Take 1 tablet 2 (two) times daily by mouth., Disp: , Rfl:  diltiazem (CARDIZEM CD) 180 MG 24 hr capsule, Take 1 capsule (180 mg total) by mouth daily. (Patient not taking: Reported on 10/13/2017), Disp: 90 capsule, Rfl: 3 nitroGLYCERIN (NITROSTAT) 0.4 MG SL tablet, Place 0.4 mg under the tongue every 5 (five) minutes as needed for chest pain., Disp: , Rfl:   ondansetron (ZOFRAN ODT) 8 MG disintegrating tablet, Take 1 tablet (8 mg total) every 8 (eight) hours as needed by mouth for nausea or vomiting. (Patient not taking: Reported on 10/26/2017), Disp: 20 tablet, Rfl: 0 sulfamethoxazole-trimethoprim (BACTRIM DS,SEPTRA DS) 800-160 MG tablet, Take 1 tablet by mouth 2 (two) times daily., Disp: 14 tablet, Rfl: 0  No current facility-administered medications for this visit.     Blood pressure 120/80, last menstrual period 03/31/2013.  Physical Exam: Abdominal exam is benign  Diagnostic Tests:   Pathology: Not applicable  Impression: Status post exam under anesthesia and no complications  Plan: Meds ordered this encounter  Medications  . megestrol (MEGACE) 40 MG/ML suspension    Sig: Take 1 mL (40 mg total) by mouth daily.    Dispense:  240 mL    Refill:  11  . sulfamethoxazole-trimethoprim (BACTRIM DS,SEPTRA DS) 800-160 MG tablet    Sig: Take 1 tablet by mouth 2 (two) times daily.    Dispense:  14 tablet    Refill:  0   She'll be continuing megestrol 40 mg daily to try to manage her endometrial hyperplasia She is also given Bactrim twice a day for her urinary symptoms  Follow up: prn    Florian Buff, MD

## 2017-11-01 DIAGNOSIS — D649 Anemia, unspecified: Secondary | ICD-10-CM | POA: Diagnosis not present

## 2017-11-01 DIAGNOSIS — D509 Iron deficiency anemia, unspecified: Secondary | ICD-10-CM | POA: Diagnosis not present

## 2017-11-02 ENCOUNTER — Encounter: Payer: Self-pay | Admitting: Obstetrics & Gynecology

## 2017-11-02 DIAGNOSIS — D500 Iron deficiency anemia secondary to blood loss (chronic): Secondary | ICD-10-CM | POA: Diagnosis not present

## 2017-11-02 DIAGNOSIS — N939 Abnormal uterine and vaginal bleeding, unspecified: Secondary | ICD-10-CM | POA: Diagnosis not present

## 2017-11-03 DIAGNOSIS — F603 Borderline personality disorder: Secondary | ICD-10-CM | POA: Diagnosis not present

## 2017-11-03 DIAGNOSIS — F3189 Other bipolar disorder: Secondary | ICD-10-CM | POA: Diagnosis not present

## 2017-11-07 DIAGNOSIS — H2511 Age-related nuclear cataract, right eye: Secondary | ICD-10-CM | POA: Diagnosis not present

## 2017-11-07 DIAGNOSIS — E119 Type 2 diabetes mellitus without complications: Secondary | ICD-10-CM | POA: Diagnosis not present

## 2017-11-07 DIAGNOSIS — Z961 Presence of intraocular lens: Secondary | ICD-10-CM | POA: Diagnosis not present

## 2017-11-10 DIAGNOSIS — F3189 Other bipolar disorder: Secondary | ICD-10-CM | POA: Diagnosis not present

## 2017-11-10 DIAGNOSIS — F603 Borderline personality disorder: Secondary | ICD-10-CM | POA: Diagnosis not present

## 2017-11-16 DIAGNOSIS — Z5181 Encounter for therapeutic drug level monitoring: Secondary | ICD-10-CM | POA: Diagnosis not present

## 2017-11-16 DIAGNOSIS — D649 Anemia, unspecified: Secondary | ICD-10-CM | POA: Diagnosis not present

## 2017-11-17 DIAGNOSIS — F603 Borderline personality disorder: Secondary | ICD-10-CM | POA: Diagnosis not present

## 2017-11-17 DIAGNOSIS — F3189 Other bipolar disorder: Secondary | ICD-10-CM | POA: Diagnosis not present

## 2017-11-24 DIAGNOSIS — F603 Borderline personality disorder: Secondary | ICD-10-CM | POA: Diagnosis not present

## 2017-11-24 DIAGNOSIS — F3189 Other bipolar disorder: Secondary | ICD-10-CM | POA: Diagnosis not present

## 2017-11-25 DIAGNOSIS — F4321 Adjustment disorder with depressed mood: Secondary | ICD-10-CM | POA: Diagnosis not present

## 2017-11-25 DIAGNOSIS — G4700 Insomnia, unspecified: Secondary | ICD-10-CM | POA: Diagnosis not present

## 2017-12-03 DIAGNOSIS — F3189 Other bipolar disorder: Secondary | ICD-10-CM | POA: Diagnosis not present

## 2017-12-03 DIAGNOSIS — F603 Borderline personality disorder: Secondary | ICD-10-CM | POA: Diagnosis not present

## 2017-12-08 DIAGNOSIS — M79675 Pain in left toe(s): Secondary | ICD-10-CM | POA: Diagnosis not present

## 2017-12-08 DIAGNOSIS — F603 Borderline personality disorder: Secondary | ICD-10-CM | POA: Diagnosis not present

## 2017-12-08 DIAGNOSIS — B351 Tinea unguium: Secondary | ICD-10-CM | POA: Diagnosis not present

## 2017-12-08 DIAGNOSIS — M79674 Pain in right toe(s): Secondary | ICD-10-CM | POA: Diagnosis not present

## 2017-12-08 DIAGNOSIS — F3189 Other bipolar disorder: Secondary | ICD-10-CM | POA: Diagnosis not present

## 2017-12-13 DIAGNOSIS — I10 Essential (primary) hypertension: Secondary | ICD-10-CM | POA: Diagnosis not present

## 2017-12-13 DIAGNOSIS — N939 Abnormal uterine and vaginal bleeding, unspecified: Secondary | ICD-10-CM | POA: Diagnosis not present

## 2017-12-13 DIAGNOSIS — M109 Gout, unspecified: Secondary | ICD-10-CM | POA: Diagnosis not present

## 2017-12-13 DIAGNOSIS — I4891 Unspecified atrial fibrillation: Secondary | ICD-10-CM | POA: Diagnosis not present

## 2017-12-15 DIAGNOSIS — F3189 Other bipolar disorder: Secondary | ICD-10-CM | POA: Diagnosis not present

## 2017-12-15 DIAGNOSIS — F603 Borderline personality disorder: Secondary | ICD-10-CM | POA: Diagnosis not present

## 2017-12-22 DIAGNOSIS — F3189 Other bipolar disorder: Secondary | ICD-10-CM | POA: Diagnosis not present

## 2017-12-22 DIAGNOSIS — F603 Borderline personality disorder: Secondary | ICD-10-CM | POA: Diagnosis not present

## 2017-12-29 ENCOUNTER — Encounter: Payer: Self-pay | Admitting: Gastroenterology

## 2017-12-29 DIAGNOSIS — F3189 Other bipolar disorder: Secondary | ICD-10-CM | POA: Diagnosis not present

## 2018-01-05 DIAGNOSIS — F3189 Other bipolar disorder: Secondary | ICD-10-CM | POA: Diagnosis not present

## 2018-01-05 DIAGNOSIS — F603 Borderline personality disorder: Secondary | ICD-10-CM | POA: Diagnosis not present

## 2018-01-12 DIAGNOSIS — F3189 Other bipolar disorder: Secondary | ICD-10-CM | POA: Diagnosis not present

## 2018-01-12 DIAGNOSIS — F603 Borderline personality disorder: Secondary | ICD-10-CM | POA: Diagnosis not present

## 2018-02-07 DIAGNOSIS — I4891 Unspecified atrial fibrillation: Secondary | ICD-10-CM | POA: Diagnosis not present

## 2018-02-07 DIAGNOSIS — I679 Cerebrovascular disease, unspecified: Secondary | ICD-10-CM | POA: Diagnosis not present

## 2018-02-07 DIAGNOSIS — I10 Essential (primary) hypertension: Secondary | ICD-10-CM | POA: Diagnosis not present

## 2018-02-07 DIAGNOSIS — I2510 Atherosclerotic heart disease of native coronary artery without angina pectoris: Secondary | ICD-10-CM | POA: Diagnosis not present

## 2018-02-08 DIAGNOSIS — F603 Borderline personality disorder: Secondary | ICD-10-CM | POA: Diagnosis not present

## 2018-02-08 DIAGNOSIS — F3189 Other bipolar disorder: Secondary | ICD-10-CM | POA: Diagnosis not present

## 2018-02-10 DIAGNOSIS — F4321 Adjustment disorder with depressed mood: Secondary | ICD-10-CM | POA: Diagnosis not present

## 2018-02-10 DIAGNOSIS — G4700 Insomnia, unspecified: Secondary | ICD-10-CM | POA: Diagnosis not present

## 2018-02-15 DIAGNOSIS — F603 Borderline personality disorder: Secondary | ICD-10-CM | POA: Diagnosis not present

## 2018-02-15 DIAGNOSIS — F3189 Other bipolar disorder: Secondary | ICD-10-CM | POA: Diagnosis not present

## 2018-04-03 DIAGNOSIS — I4891 Unspecified atrial fibrillation: Secondary | ICD-10-CM | POA: Diagnosis not present

## 2018-04-03 DIAGNOSIS — F0150 Vascular dementia without behavioral disturbance: Secondary | ICD-10-CM | POA: Diagnosis not present

## 2018-04-03 DIAGNOSIS — I2510 Atherosclerotic heart disease of native coronary artery without angina pectoris: Secondary | ICD-10-CM | POA: Diagnosis not present

## 2018-04-14 DIAGNOSIS — F4321 Adjustment disorder with depressed mood: Secondary | ICD-10-CM | POA: Diagnosis not present

## 2018-04-14 DIAGNOSIS — G4700 Insomnia, unspecified: Secondary | ICD-10-CM | POA: Diagnosis not present

## 2018-05-17 DIAGNOSIS — B351 Tinea unguium: Secondary | ICD-10-CM | POA: Diagnosis not present

## 2018-05-17 DIAGNOSIS — M79675 Pain in left toe(s): Secondary | ICD-10-CM | POA: Diagnosis not present

## 2018-05-17 DIAGNOSIS — M79674 Pain in right toe(s): Secondary | ICD-10-CM | POA: Diagnosis not present

## 2018-05-23 DIAGNOSIS — N939 Abnormal uterine and vaginal bleeding, unspecified: Secondary | ICD-10-CM | POA: Diagnosis not present

## 2018-06-06 DIAGNOSIS — I10 Essential (primary) hypertension: Secondary | ICD-10-CM | POA: Diagnosis not present

## 2018-06-06 DIAGNOSIS — I4891 Unspecified atrial fibrillation: Secondary | ICD-10-CM | POA: Diagnosis not present

## 2018-06-06 DIAGNOSIS — I679 Cerebrovascular disease, unspecified: Secondary | ICD-10-CM | POA: Diagnosis not present

## 2018-06-06 DIAGNOSIS — N939 Abnormal uterine and vaginal bleeding, unspecified: Secondary | ICD-10-CM | POA: Diagnosis not present

## 2018-06-12 ENCOUNTER — Encounter: Payer: Self-pay | Admitting: Obstetrics & Gynecology

## 2018-06-12 ENCOUNTER — Ambulatory Visit (INDEPENDENT_AMBULATORY_CARE_PROVIDER_SITE_OTHER): Payer: Medicare HMO | Admitting: Obstetrics & Gynecology

## 2018-06-12 VITALS — BP 162/94 | HR 67 | Wt 229.0 lb

## 2018-06-12 DIAGNOSIS — N8500 Endometrial hyperplasia, unspecified: Secondary | ICD-10-CM

## 2018-06-26 DIAGNOSIS — D509 Iron deficiency anemia, unspecified: Secondary | ICD-10-CM | POA: Diagnosis not present

## 2018-06-26 DIAGNOSIS — E119 Type 2 diabetes mellitus without complications: Secondary | ICD-10-CM | POA: Diagnosis not present

## 2018-06-26 DIAGNOSIS — E08319 Diabetes mellitus due to underlying condition with unspecified diabetic retinopathy without macular edema: Secondary | ICD-10-CM | POA: Diagnosis not present

## 2018-06-26 DIAGNOSIS — D649 Anemia, unspecified: Secondary | ICD-10-CM | POA: Diagnosis not present

## 2018-07-27 DIAGNOSIS — M1712 Unilateral primary osteoarthritis, left knee: Secondary | ICD-10-CM | POA: Diagnosis not present

## 2018-07-28 ENCOUNTER — Encounter: Payer: Self-pay | Admitting: Obstetrics & Gynecology

## 2018-07-28 NOTE — Progress Notes (Signed)
No chief complaint on file.     77 y.o. [id number] Patient's last menstrual period was 03/31/2013. The current method of family planning is post menopausal status.  Outpatient Encounter Medications as of 06/12/2018  Medication Sig  . acetaminophen (TYLENOL) 325 MG tablet Take 650 mg every 6 (six) hours as needed by mouth for mild pain or fever.   Marland Kitchen allopurinol (ZYLOPRIM) 100 MG tablet Take 100 mg by mouth daily.  Marland Kitchen diltiazem (CARDIZEM CD) 180 MG 24 hr capsule Take 1 capsule (180 mg total) by mouth daily.  Marland Kitchen diltiazem (CARDIZEM) 30 MG tablet Take 30 mg 2 (two) times daily by mouth.  . guaifenesin (HUMIBID E) 400 MG TABS tablet Take 400 mg every 12 (twelve) hours by mouth.  Marland Kitchen guaiFENesin-dextromethorphan (ROBITUSSIN DM) 100-10 MG/5ML syrup Take 5 mLs by mouth every 4 (four) hours as needed for cough.  . megestrol (MEGACE) 40 MG/ML suspension Take 1 mL (40 mg total) by mouth daily.  . nitroGLYCERIN (NITROSTAT) 0.4 MG SL tablet Place 0.4 mg under the tongue every 5 (five) minutes as needed for chest pain.  Marland Kitchen senna-docusate (SENNA PLUS) 8.6-50 MG tablet Take 1 tablet 2 (two) times daily by mouth.  Marland Kitchen ketorolac (TORADOL) 10 MG tablet Take 1 tablet (10 mg total) every 8 (eight) hours as needed by mouth. (Patient not taking: Reported on 06/12/2018)  . Melatonin 5 MG CAPS Take 5 mg at bedtime by mouth.  . ondansetron (ZOFRAN ODT) 8 MG disintegrating tablet Take 1 tablet (8 mg total) every 8 (eight) hours as needed by mouth for nausea or vomiting. (Patient not taking: Reported on 10/26/2017)  . OXYGEN Inhale 2 L as needed into the lungs (via nasal cannula for shortness of breath or O2 < 92%).  Marland Kitchen scopolamine (TRANSDERM-SCOP) 1 MG/3DAYS Place 1 patch onto the skin every 3 (three) days.  Marland Kitchen sulfamethoxazole-trimethoprim (BACTRIM DS,SEPTRA DS) 800-160 MG tablet Take 1 tablet by mouth 2 (two) times daily. (Patient not taking: Reported on 06/12/2018)   No facility-administered encounter medications on file  as of 06/12/2018.     Subjective Brought in from facility for episodic vaginal bleeding On megestrol for known endometrial hyperplasia No other treatment options possible, bleeding is variable sometimes bright red not heavy anytime Past Medical History:  Diagnosis Date  . Anemia   . Arthritis    osteoarthritis  . Asthma   . Back pain, chronic   . Chronic knee pain   . Diabetes mellitus   . Dysphagia   . Dysrhythmia    AFib  . HOH (hard of hearing)   . Hyperlipidemia   . Hypertension   . Obesity   . Phonological disorder   . PMB (postmenopausal bleeding) 11/26/2014  . Stroke [facility])    Mini Stroke; nleft sided hemiplegia  . Thickened endometrium 01/15/2015  . TIA (transient ischemic attack)   . Urge incontinence 11/26/2014  . Vertigo     Past Surgical History:  Procedure Laterality Date  . APPENDECTOMY    . CATARACT EXTRACTION W/PHACO Left 01/16/2013   Procedure: CATARACT EXTRACTION PHACO AND INTRAOCULAR LENS PLACEMENT (IOC);  Surgeon: Elta Guadeloupe T. Gershon Crane, MD;  Location: [facility] ORS;  Service: Ophthalmology;  Laterality: Left;  CDE:14.37  . colonoscopy  2009   Dr. Wilford Corner: 4 hyperplastic polyps removed. Small internal hemorrhoids. Recommended 5 year follow-up colonoscopy.  . COLONOSCOPY N/A 02/24/2015   Procedure: COLONOSCOPY;  Surgeon: Danie Binder, MD;  Location: [facility] ENDO SUITE;  Service: Endoscopy;  Laterality: N/A;  1030  . ENDOMETRIAL ABLATION    . ENDOMETRIAL BIOPSY  04/03/2013      . HYSTEROSCOPY W/D&C N/A 04/24/2013   Procedure: SUCTION DILATATION AND CURETTAGE /HYSTEROSCOPY;  Surgeon: Jonnie Kind, MD;  Location: [facility] ORS;  Service: Gynecology;  Laterality: N/A;  . HYSTEROSCOPY W/D&C N/A 04/02/2015   Procedure: UTERINE CURETTAGE, HYSTEROSCOPY;  Surgeon: Florian Buff, MD;  Location: [facility] ORS;  Service: Gynecology;  Laterality: N/A;  . KNEE ARTHROSCOPY WITH MEDIAL MENISECTOMY Left 07/26/2013   Procedure: KNEE ARTHROSCOPY WITH PARTIAL MEDIAL MENISECTOMY;  Surgeon: Sanjuana Kava, MD;  Location: [facility] ORS;  Service: Orthopedics;  Laterality: Left;  . OVARY SURGERY      OB History    Gravida  2   Para  2   Term  2   Preterm      AB      Living  2     SAB      TAB      Ectopic      Multiple      Live Births  2           Allergies  Allergen Reactions  . Imitrex [Sumatriptan] Other (See Comments)    Unknown Reaction   . Mango Flavor Other (See Comments)    Nausea and vomiting  . Oysters [Shellfish Allergy] Other (See Comments)    Nausea and vomiting    Social History   Socioeconomic History  . Marital status: Widowed    Spouse name: Not on file  . Number of children: 2  . Years of education: Not on file  . Highest education level: Not on file  Occupational History  . Occupation: retired  Scientific laboratory technician  . Financial resource strain: Not on file  . Food insecurity:    Worry: Not on file    Inability: Not on file  . Transportation needs:    Medical: Not on file    Non-medical: Not on file  Tobacco Use  . Smoking status: Former Smoker    Packs/day: 0.25    Years: 30.00    Pack years: 7.50    Types: Cigarettes    Last attempt to quit: 05/20/2015    Years since quitting: 3.1  . Smokeless tobacco: Never Used  Substance and Sexual Activity  . Alcohol use: No    Alcohol/week: 0.0 standard drinks  . Drug use: No  . Sexual activity: Not Currently    Birth control/protection: Post-menopausal, IUD    Comment: ablation  Lifestyle  . Physical activity:    Days per week: Not on file    Minutes per session: Not on file  . Stress: Not on file  Relationships  . Social connections:    Talks on phone: Not on file    Gets together: Not on file    Attends religious service: Not on file    Active member of club or organization: Not on file    Attends meetings of clubs or organizations: Not on file    Relationship status: Not on file  Other Topics Concern  . Not on file  Social History Narrative  . Not on file    Family  History  Problem Relation Age of Onset  . Arrhythmia Father        Atrial fib/Pacemaker  . Heart failure Mother   . Diabetes Mother   . Other Mother        fell and broke hip  . Diabetes Sister   . Lupus Daughter   .  Mental illness Son   . ADD / ADHD Son   . Other Son        soft bones  . Diabetes Maternal Grandmother   . Stroke Paternal Grandmother   . Arthritis Paternal Grandfather   . Other Sister        MVA  . Hypertension Brother   . Diabetes Sister   . Hypertension Sister   . Colon cancer Neg Hx     Medications:       Current Outpatient Medications:  .  acetaminophen (TYLENOL) 325 MG tablet, Take 650 mg every 6 (six) hours as needed by mouth for mild pain or fever. , Disp: , Rfl:  .  allopurinol (ZYLOPRIM) 100 MG tablet, Take 100 mg by mouth daily., Disp: , Rfl:  .  diltiazem (CARDIZEM CD) 180 MG 24 hr capsule, Take 1 capsule (180 mg total) by mouth daily., Disp: 90 capsule, Rfl: 3 .  diltiazem (CARDIZEM) 30 MG tablet, Take 30 mg 2 (two) times daily by mouth., Disp: , Rfl:  .  guaifenesin (HUMIBID E) 400 MG TABS tablet, Take 400 mg every 12 (twelve) hours by mouth., Disp: , Rfl:  .  guaiFENesin-dextromethorphan (ROBITUSSIN DM) 100-10 MG/5ML syrup, Take 5 mLs by mouth every 4 (four) hours as needed for cough., Disp: , Rfl:  .  megestrol (MEGACE) 40 MG/ML suspension, Take 1 mL (40 mg total) by mouth daily., Disp: 240 mL, Rfl: 11 .  nitroGLYCERIN (NITROSTAT) 0.4 MG SL tablet, Place 0.4 mg under the tongue every 5 (five) minutes as needed for chest pain., Disp: , Rfl:  .  senna-docusate (SENNA PLUS) 8.6-50 MG tablet, Take 1 tablet 2 (two) times daily by mouth., Disp: , Rfl:  .  ketorolac (TORADOL) 10 MG tablet, Take 1 tablet (10 mg total) every 8 (eight) hours as needed by mouth. (Patient not taking: Reported on 06/12/2018), Disp: 15 tablet, Rfl: 0 .  Melatonin 5 MG CAPS, Take 5 mg at bedtime by mouth., Disp: , Rfl:  .  ondansetron (ZOFRAN ODT) 8 MG disintegrating tablet, Take 1  tablet (8 mg total) every 8 (eight) hours as needed by mouth for nausea or vomiting. (Patient not taking: Reported on 10/26/2017), Disp: 20 tablet, Rfl: 0 .  OXYGEN, Inhale 2 L as needed into the lungs (via nasal cannula for shortness of breath or O2 < 92%)., Disp: , Rfl:  .  scopolamine (TRANSDERM-SCOP) 1 MG/3DAYS, Place 1 patch onto the skin every 3 (three) days., Disp: , Rfl:  .  sulfamethoxazole-trimethoprim (BACTRIM DS,SEPTRA DS) 800-160 MG tablet, Take 1 tablet by mouth 2 (two) times daily. (Patient not taking: Reported on 06/12/2018), Disp: 14 tablet, Rfl: 0  Objective Blood pressure (!) 162/94, pulse 67, weight 229 lb (103.9 kg), last menstrual period 03/31/2013.  General obese bed bound female Vulva:  normal appearing vulva with no masses, tenderness or lesions Vagina:  normal mucosa, no discharge Cervix:  no cervical motion tenderness and no lesions Uterus:  normal size, contour, position, consistency, mobility, non-tender Adnexa: ovaries:,     Pertinent ROS No burning with urination, frequency or urgency No nausea, vomiting or diarrhea Nor fever chills or other constitutional symptoms   Labs or studies No new    Impression Diagnoses this Encounter::   ICD-10-CM   1. Endometrial hyperplasia N85.00     Established relevant diagnosis(es):   Plan/Recommendations: No orders of the defined types were placed in this encounter.   Labs or Scans Ordered: No orders of the defined types were  placed in this encounter.   Management:: Still with bleeding despite megestrol therapy, there is no other managemtn options for patient, see op note, so can increase the megestrol if needed but can expect to continue to have bleeding as a result of pharmacological managemtn of her endometrial hyperplasia  Follow up Return if symptoms worsen or fail to improve.        Face to face time:  10 minutes  Greater than 50% of the visit time was spent in counseling and coordination  of care with the patient.  The summary and outline of the counseling and care coordination is summarized in the note above.   All questions were answered.

## 2018-08-04 DIAGNOSIS — G4700 Insomnia, unspecified: Secondary | ICD-10-CM | POA: Diagnosis not present

## 2018-08-04 DIAGNOSIS — F4321 Adjustment disorder with depressed mood: Secondary | ICD-10-CM | POA: Diagnosis not present

## 2018-08-09 DIAGNOSIS — E1151 Type 2 diabetes mellitus with diabetic peripheral angiopathy without gangrene: Secondary | ICD-10-CM | POA: Diagnosis not present

## 2018-08-09 DIAGNOSIS — L84 Corns and callosities: Secondary | ICD-10-CM | POA: Diagnosis not present

## 2018-08-15 DIAGNOSIS — I2510 Atherosclerotic heart disease of native coronary artery without angina pectoris: Secondary | ICD-10-CM | POA: Diagnosis not present

## 2018-08-15 DIAGNOSIS — N939 Abnormal uterine and vaginal bleeding, unspecified: Secondary | ICD-10-CM | POA: Diagnosis not present

## 2018-08-15 DIAGNOSIS — I10 Essential (primary) hypertension: Secondary | ICD-10-CM | POA: Diagnosis not present

## 2018-08-15 DIAGNOSIS — I679 Cerebrovascular disease, unspecified: Secondary | ICD-10-CM | POA: Diagnosis not present

## 2018-08-18 ENCOUNTER — Emergency Department (HOSPITAL_COMMUNITY): Payer: Medicare HMO

## 2018-08-18 ENCOUNTER — Inpatient Hospital Stay (HOSPITAL_COMMUNITY)
Admission: EM | Admit: 2018-08-18 | Discharge: 2018-08-24 | DRG: 062 | Disposition: A | Discharge status: Hospice | Payer: Medicare HMO | Attending: Neurology | Admitting: Neurology

## 2018-08-18 ENCOUNTER — Other Ambulatory Visit: Payer: Self-pay

## 2018-08-18 ENCOUNTER — Encounter (HOSPITAL_COMMUNITY): Payer: Self-pay | Admitting: Emergency Medicine

## 2018-08-18 ENCOUNTER — Inpatient Hospital Stay (HOSPITAL_COMMUNITY): Payer: Medicare HMO

## 2018-08-18 DIAGNOSIS — M549 Dorsalgia, unspecified: Secondary | ICD-10-CM | POA: Diagnosis present

## 2018-08-18 DIAGNOSIS — G459 Transient cerebral ischemic attack, unspecified: Secondary | ICD-10-CM | POA: Diagnosis not present

## 2018-08-18 DIAGNOSIS — Z66 Do not resuscitate: Secondary | ICD-10-CM | POA: Diagnosis present

## 2018-08-18 DIAGNOSIS — I619 Nontraumatic intracerebral hemorrhage, unspecified: Secondary | ICD-10-CM | POA: Diagnosis not present

## 2018-08-18 DIAGNOSIS — G8929 Other chronic pain: Secondary | ICD-10-CM | POA: Diagnosis present

## 2018-08-18 DIAGNOSIS — R29726 NIHSS score 26: Secondary | ICD-10-CM | POA: Diagnosis not present

## 2018-08-18 DIAGNOSIS — Z91013 Allergy to seafood: Secondary | ICD-10-CM

## 2018-08-18 DIAGNOSIS — M25569 Pain in unspecified knee: Secondary | ICD-10-CM | POA: Diagnosis not present

## 2018-08-18 DIAGNOSIS — T17908A Unspecified foreign body in respiratory tract, part unspecified causing other injury, initial encounter: Secondary | ICD-10-CM

## 2018-08-18 DIAGNOSIS — Z8673 Personal history of transient ischemic attack (TIA), and cerebral infarction without residual deficits: Secondary | ICD-10-CM | POA: Diagnosis not present

## 2018-08-18 DIAGNOSIS — R402431 Glasgow coma scale score 3-8, in the field [EMT or ambulance]: Secondary | ICD-10-CM | POA: Diagnosis not present

## 2018-08-18 DIAGNOSIS — Z87891 Personal history of nicotine dependence: Secondary | ICD-10-CM | POA: Diagnosis not present

## 2018-08-18 DIAGNOSIS — R4701 Aphasia: Secondary | ICD-10-CM | POA: Diagnosis present

## 2018-08-18 DIAGNOSIS — Z515 Encounter for palliative care: Secondary | ICD-10-CM

## 2018-08-18 DIAGNOSIS — I63412 Cerebral infarction due to embolism of left middle cerebral artery: Secondary | ICD-10-CM | POA: Diagnosis not present

## 2018-08-18 DIAGNOSIS — Z7983 Long term (current) use of bisphosphonates: Secondary | ICD-10-CM

## 2018-08-18 DIAGNOSIS — R0902 Hypoxemia: Secondary | ICD-10-CM | POA: Diagnosis not present

## 2018-08-18 DIAGNOSIS — Z9842 Cataract extraction status, left eye: Secondary | ICD-10-CM | POA: Diagnosis not present

## 2018-08-18 DIAGNOSIS — J45909 Unspecified asthma, uncomplicated: Secondary | ICD-10-CM | POA: Diagnosis present

## 2018-08-18 DIAGNOSIS — I4891 Unspecified atrial fibrillation: Secondary | ICD-10-CM

## 2018-08-18 DIAGNOSIS — Z7189 Other specified counseling: Secondary | ICD-10-CM | POA: Diagnosis not present

## 2018-08-18 DIAGNOSIS — I639 Cerebral infarction, unspecified: Secondary | ICD-10-CM | POA: Diagnosis not present

## 2018-08-18 DIAGNOSIS — Z9102 Food additives allergy status: Secondary | ICD-10-CM

## 2018-08-18 DIAGNOSIS — R29725 NIHSS score 25: Secondary | ICD-10-CM | POA: Diagnosis present

## 2018-08-18 DIAGNOSIS — M1990 Unspecified osteoarthritis, unspecified site: Secondary | ICD-10-CM | POA: Diagnosis not present

## 2018-08-18 DIAGNOSIS — R569 Unspecified convulsions: Secondary | ICD-10-CM | POA: Diagnosis not present

## 2018-08-18 DIAGNOSIS — R4020 Unspecified coma: Secondary | ICD-10-CM | POA: Diagnosis not present

## 2018-08-18 DIAGNOSIS — Z9981 Dependence on supplemental oxygen: Secondary | ICD-10-CM

## 2018-08-18 DIAGNOSIS — E1151 Type 2 diabetes mellitus with diabetic peripheral angiopathy without gangrene: Secondary | ICD-10-CM | POA: Diagnosis not present

## 2018-08-18 DIAGNOSIS — I69354 Hemiplegia and hemiparesis following cerebral infarction affecting left non-dominant side: Secondary | ICD-10-CM | POA: Diagnosis not present

## 2018-08-18 DIAGNOSIS — Z823 Family history of stroke: Secondary | ICD-10-CM

## 2018-08-18 DIAGNOSIS — I481 Persistent atrial fibrillation: Secondary | ICD-10-CM | POA: Diagnosis present

## 2018-08-18 DIAGNOSIS — Z79890 Hormone replacement therapy (postmenopausal): Secondary | ICD-10-CM

## 2018-08-18 DIAGNOSIS — I480 Paroxysmal atrial fibrillation: Secondary | ICD-10-CM | POA: Diagnosis present

## 2018-08-18 DIAGNOSIS — E785 Hyperlipidemia, unspecified: Secondary | ICD-10-CM | POA: Diagnosis not present

## 2018-08-18 DIAGNOSIS — D649 Anemia, unspecified: Secondary | ICD-10-CM | POA: Diagnosis present

## 2018-08-18 DIAGNOSIS — Z79899 Other long term (current) drug therapy: Secondary | ICD-10-CM

## 2018-08-18 DIAGNOSIS — I10 Essential (primary) hypertension: Secondary | ICD-10-CM | POA: Diagnosis present

## 2018-08-18 DIAGNOSIS — H9190 Unspecified hearing loss, unspecified ear: Secondary | ICD-10-CM | POA: Diagnosis present

## 2018-08-18 DIAGNOSIS — Z7401 Bed confinement status: Secondary | ICD-10-CM | POA: Diagnosis not present

## 2018-08-18 DIAGNOSIS — M2550 Pain in unspecified joint: Secondary | ICD-10-CM | POA: Diagnosis not present

## 2018-08-18 DIAGNOSIS — Z961 Presence of intraocular lens: Secondary | ICD-10-CM | POA: Diagnosis not present

## 2018-08-18 DIAGNOSIS — Z888 Allergy status to other drugs, medicaments and biological substances status: Secondary | ICD-10-CM

## 2018-08-18 DIAGNOSIS — R29810 Facial weakness: Secondary | ICD-10-CM | POA: Diagnosis not present

## 2018-08-18 DIAGNOSIS — Z86718 Personal history of other venous thrombosis and embolism: Secondary | ICD-10-CM | POA: Diagnosis not present

## 2018-08-18 DIAGNOSIS — Z833 Family history of diabetes mellitus: Secondary | ICD-10-CM

## 2018-08-18 DIAGNOSIS — H518 Other specified disorders of binocular movement: Secondary | ICD-10-CM | POA: Diagnosis present

## 2018-08-18 DIAGNOSIS — E7800 Pure hypercholesterolemia, unspecified: Secondary | ICD-10-CM | POA: Diagnosis present

## 2018-08-18 DIAGNOSIS — Z8249 Family history of ischemic heart disease and other diseases of the circulatory system: Secondary | ICD-10-CM

## 2018-08-18 DIAGNOSIS — Z6841 Body Mass Index (BMI) 40.0 and over, adult: Secondary | ICD-10-CM

## 2018-08-18 DIAGNOSIS — I6523 Occlusion and stenosis of bilateral carotid arteries: Secondary | ICD-10-CM | POA: Diagnosis not present

## 2018-08-18 DIAGNOSIS — F0390 Unspecified dementia without behavioral disturbance: Secondary | ICD-10-CM | POA: Diagnosis present

## 2018-08-18 DIAGNOSIS — J9811 Atelectasis: Secondary | ICD-10-CM | POA: Diagnosis not present

## 2018-08-18 DIAGNOSIS — I351 Nonrheumatic aortic (valve) insufficiency: Secondary | ICD-10-CM | POA: Diagnosis not present

## 2018-08-18 DIAGNOSIS — I672 Cerebral atherosclerosis: Secondary | ICD-10-CM | POA: Diagnosis present

## 2018-08-18 HISTORY — DX: Cognitive communication deficit: R41.841

## 2018-08-18 HISTORY — DX: Hemiplegia and hemiparesis following cerebral infarction affecting left non-dominant side: I69.354

## 2018-08-18 HISTORY — DX: Unspecified dementia, unspecified severity, without behavioral disturbance, psychotic disturbance, mood disturbance, and anxiety: F03.90

## 2018-08-18 HISTORY — DX: Acute embolism and thrombosis of unspecified deep veins of unspecified lower extremity: I82.409

## 2018-08-18 HISTORY — DX: Paroxysmal atrial fibrillation: I48.0

## 2018-08-18 LAB — DIFFERENTIAL
Basophils Absolute: 0 10*3/uL (ref 0.0–0.1)
Basophils Relative: 0 %
Eosinophils Absolute: 0.1 10*3/uL (ref 0.0–0.7)
Eosinophils Relative: 1 %
Lymphocytes Relative: 38 %
Lymphs Abs: 2.3 10*3/uL (ref 0.7–4.0)
Monocytes Absolute: 0.7 10*3/uL (ref 0.1–1.0)
Monocytes Relative: 12 %
Neutro Abs: 2.9 10*3/uL (ref 1.7–7.7)
Neutrophils Relative %: 49 %

## 2018-08-18 LAB — CBC
HCT: 41.2 % (ref 36.0–46.0)
Hemoglobin: 14.2 g/dL (ref 12.0–15.0)
MCH: 34.6 pg — ABNORMAL HIGH (ref 26.0–34.0)
MCHC: 34.5 g/dL (ref 30.0–36.0)
MCV: 100.5 fL — ABNORMAL HIGH (ref 78.0–100.0)
Platelets: 213 10*3/uL (ref 150–400)
RBC: 4.1 MIL/uL (ref 3.87–5.11)
RDW: 12.9 % (ref 11.5–15.5)
WBC: 5.9 10*3/uL (ref 4.0–10.5)

## 2018-08-18 LAB — COMPREHENSIVE METABOLIC PANEL
ALT: 25 U/L (ref 0–44)
AST: 19 U/L (ref 15–41)
Albumin: 3.1 g/dL — ABNORMAL LOW (ref 3.5–5.0)
Alkaline Phosphatase: 56 U/L (ref 38–126)
Anion gap: 6 (ref 5–15)
BUN: 19 mg/dL (ref 8–23)
CO2: 25 mmol/L (ref 22–32)
Calcium: 8.7 mg/dL — ABNORMAL LOW (ref 8.9–10.3)
Chloride: 108 mmol/L (ref 98–111)
Creatinine, Ser: 1.26 mg/dL — ABNORMAL HIGH (ref 0.44–1.00)
GFR calc Af Amer: 47 mL/min — ABNORMAL LOW (ref >60)
GFR calc non Af Amer: 41 mL/min — ABNORMAL LOW (ref >60)
Glucose, Bld: 96 mg/dL (ref 70–99)
Potassium: 4.2 mmol/L (ref 3.5–5.1)
Sodium: 139 mmol/L (ref 135–145)
Total Bilirubin: 0.5 mg/dL (ref 0.3–1.2)
Total Protein: 7.3 g/dL (ref 6.5–8.1)

## 2018-08-18 LAB — I-STAT CHEM 8, ED
BUN: 20 mg/dL (ref 8–23)
Calcium, Ion: 1.2 mmol/L (ref 1.15–1.40)
Chloride: 108 mmol/L (ref 98–111)
Creatinine, Ser: 1.3 mg/dL — ABNORMAL HIGH (ref 0.44–1.00)
Glucose, Bld: 90 mg/dL (ref 70–99)
HCT: 41 % (ref 36.0–46.0)
Hemoglobin: 13.9 g/dL (ref 12.0–15.0)
Potassium: 4.2 mmol/L (ref 3.5–5.1)
Sodium: 141 mmol/L (ref 135–145)
TCO2: 24 mmol/L (ref 22–32)

## 2018-08-18 LAB — APTT: aPTT: 29 seconds (ref 24–36)

## 2018-08-18 LAB — ETHANOL: Alcohol, Ethyl (B): <10 mg/dL (ref <10)

## 2018-08-18 LAB — CBG MONITORING, ED: Glucose-Capillary: 82 mg/dL (ref 70–99)

## 2018-08-18 LAB — PROTIME-INR
INR: 1.07
Prothrombin Time: 13.8 seconds (ref 11.4–15.2)

## 2018-08-18 LAB — I-STAT TROPONIN, ED: Troponin i, poc: 0.01 ng/mL (ref 0.00–0.08)

## 2018-08-18 MED ORDER — CLEVIDIPINE BUTYRATE 0.5 MG/ML IV EMUL: 0.0000 mg/h | INTRAVENOUS | Status: DC

## 2018-08-18 MED ORDER — ALTEPLASE 100 MG IV SOLR
INTRAVENOUS | Status: AC
  Administered 2018-08-18: 90 mg via INTRAVENOUS
  Filled 2018-08-18: qty 100

## 2018-08-18 MED ORDER — ACETAMINOPHEN 325 MG PO TABS: 650.0000 mg | ORAL_TABLET | ORAL | Status: DC | PRN

## 2018-08-18 MED ORDER — MEGESTROL ACETATE 40 MG/ML PO SUSP
40.0000 mg | Freq: Every day | ORAL | Status: DC
  Filled 2018-08-18: qty 5

## 2018-08-18 MED ORDER — ACETAMINOPHEN 650 MG RE SUPP: 650.0000 mg | RECTAL | Status: DC | PRN

## 2018-08-18 MED ORDER — SODIUM CHLORIDE 0.9 % IV SOLN
INTRAVENOUS | Status: DC
  Administered 2018-08-18: 1000 mL via INTRAVENOUS

## 2018-08-18 MED ORDER — MELATONIN 3 MG PO TABS
3.0000 mg | ORAL_TABLET | Freq: Every day | ORAL | Status: DC
  Administered 2018-08-19 – 2018-08-23 (×3): 3 mg via ORAL
  Filled 2018-08-18 (×7): qty 1

## 2018-08-18 MED ORDER — FAMOTIDINE IN NACL 20-0.9 MG/50ML-% IV SOLN
20.0000 mg | Freq: Two times a day (BID) | INTRAVENOUS | Status: DC
  Administered 2018-08-18 – 2018-08-24 (×12): 20 mg via INTRAVENOUS
  Filled 2018-08-18 (×12): qty 50

## 2018-08-18 MED ORDER — LABETALOL HCL 5 MG/ML IV SOLN: 20.0000 mg | Freq: Once | INTRAVENOUS | Status: DC

## 2018-08-18 MED ORDER — DILTIAZEM HCL 30 MG PO TABS
30.0000 mg | ORAL_TABLET | Freq: Two times a day (BID) | ORAL | Status: DC
  Administered 2018-08-19 – 2018-08-24 (×11): 30 mg via ORAL
  Filled 2018-08-18 (×11): qty 1

## 2018-08-18 MED ORDER — SENNOSIDES-DOCUSATE SODIUM 8.6-50 MG PO TABS: 1.0000 | ORAL_TABLET | Freq: Every evening | ORAL | Status: DC | PRN

## 2018-08-18 MED ORDER — ALTEPLASE (STROKE) FULL DOSE INFUSION
90.0000 mg | Freq: Once | INTRAVENOUS | Status: AC
  Administered 2018-08-18: 90 mg via INTRAVENOUS
  Filled 2018-08-18: qty 100

## 2018-08-18 MED ORDER — STROKE: EARLY STAGES OF RECOVERY BOOK
Freq: Once | Status: DC
  Filled 2018-08-18 (×2): qty 1

## 2018-08-18 MED ORDER — ACETAMINOPHEN 160 MG/5ML PO SOLN: 650.0000 mg | ORAL | Status: DC | PRN

## 2018-08-18 MED ORDER — IOPAMIDOL (ISOVUE-370) INJECTION 76%
100.0000 mL | Freq: Once | INTRAVENOUS | Status: AC | PRN
  Administered 2018-08-18: 100 mL via INTRAVENOUS

## 2018-08-18 MED ORDER — ALLOPURINOL 100 MG PO TABS
100.0000 mg | ORAL_TABLET | Freq: Every day | ORAL | Status: DC
  Administered 2018-08-19 – 2018-08-24 (×6): 100 mg via ORAL
  Filled 2018-08-18 (×7): qty 1

## 2018-08-18 MED ORDER — GUAIFENESIN 200 MG PO TABS
400.0000 mg | ORAL_TABLET | Freq: Two times a day (BID) | ORAL | Status: DC
  Administered 2018-08-19 – 2018-08-23 (×10): 400 mg via ORAL
  Filled 2018-08-18 (×13): qty 2

## 2018-08-18 MED ORDER — NITROGLYCERIN 0.4 MG SL SUBL: 0.4000 mg | SUBLINGUAL_TABLET | SUBLINGUAL | Status: DC | PRN

## 2018-08-18 MED ORDER — SODIUM CHLORIDE 0.9 % IV SOLN
50.0000 mL | Freq: Once | INTRAVENOUS | Status: AC
  Administered 2018-08-18: 50 mL via INTRAVENOUS

## 2018-08-18 NOTE — ED Notes (Signed)
Pt from CT more aware but continues to not be able to follow commands nor speak

## 2018-08-18 NOTE — ED Notes (Signed)
Family at bedside Pt appears to be tracking family members with her eyes

## 2018-08-18 NOTE — Progress Notes (Signed)
Code Stroke Times   7741 Call Time [place] Exam Started  2878 Exam Ended  6767 Images sent, exam completed  [place]  GR called

## 2018-08-18 NOTE — ED Notes (Signed)
Pt in CT angio

## 2018-08-18 NOTE — ED Triage Notes (Addendum)
Per EMS, pt has history of stroke and reports last known well 1515. EMS called out in reference to seizure. Pt noted to have deviation of eyes at time of arrival. Pt at baseline communicates and able to move right lower extremity while in wheelchair. Pt non verbal and not following commands at this time. cbg en route 100.   Pt cleared by EDP at arrival. Code stroke called,teleneuro consulted and on camera at this time.   Pt currently in CT.

## 2018-08-18 NOTE — ED Notes (Signed)
Family discussion with Dr Jerilynn Mages and neuro and decision to give TPA in spite of DNR

## 2018-08-18 NOTE — ED Notes (Signed)
Report to [place], [employer]

## 2018-08-18 NOTE — ED Notes (Signed)
Rate, dose, and waste verified with Tele Neuro and Neurologist prior to administration. Bolus administered via pump.

## 2018-08-18 NOTE — ED Notes (Signed)
[facility] at bedside. pt on [facility] stretcher and [facility] staff performing neuro assessment.

## 2018-08-18 NOTE — Consult Note (Addendum)
CTA showed distal L M1 occlusion.  D/w case w NIR and patient not candidate for NIR due to poor baseline status and high morbidity.  D/w ED team. Patient will be transferred for post-tpa care.    Date:08/18/18 [facility] TeleNeurology Consult Services  Impression: Suspected L MCA stroke  Symptoms consistent with LVO therefore CTA H and N and P recommended  Differential Diagnosis:  1. Cardioembolic stroke  2. Small vessel disease/lacune  3. Thromboembolic, artery-to-artery mechanism  4. Hypercoagulable state-related infarct  5. Transient ischemic attack  6. Thrombotic mechanism, large artery disease   Comments:   Last known well:1515 Door time:  1706 [employer] contacted: 1941 [employer] at bedside: 1723 NIHSS assessment time: 1723 Verbal tpa order:premix [id number]; decision [id number] (family consent delay) BP prior to bolus: 153/88; 135/83 Needle time: 1756  Verbal Consent to tPA:  I have explained to the patient/family/guardian the nature of the patient's condition, the use of tPA fibrinolytic agent, and the benefits to be reasonably expected compared with alternative approaches. I have discussed the likelihood of major risks or complications of this procedure including (if applicable) but not limited to loss of limb function, brain damage, paralysis, hemorrhage, infection, complications from transfusion of blood components, drug reactions, blood clots and loss of life. I have also indicated that with any procedure there is always the possibility of an unexpected complication. I have explained the risks which include:    1. Death, Stroke or permanent neurologic injury (paralysis, coma, etc)   2. Worsening of stroke symptoms from swelling or bleeding in the brain   3. Bleeding in other parts of the body   4. Need for blood transfusions to replace blood or clotting factors   5. Allergic reaction to medications   6. Other unexpected complications    Specifically  discussed the question about possible vaginal cancer - daughter says that is a possibility but not confirmed and no recent bleeding confirmed by NH in last 21days.  Daughter understands the risks of bleeding may be higher in the case and confirms and wants TPA given.   All questions were answered and the patient/family/guardian express understanding of the treatment plan and consent to the procedure.  Our recommendations are outlined below.   We will be seeing the patient back in follow up as noted.    Recommendations:   IV tPA - dose = 9mg  +81mg  Routine post tPA monitoring including neuro checks and blood pressure control during/after treatment Monitor blood pressure Check blood pressure and NIHSS every 15 min for 2 h, then every 30 min for 6 h, and finally every hour for 16 h  Systolic greater than 185 OR diastolic greater than 631: Option 1: Labetalol 10 mg IV for 1 - 2 min May repeat or double labetalol every 10 min to maximum dose of 300 mg, or give initial labetalol dose, then start labetalol drip at 2 - 8 mg/min. Option 2: Nicardipine 5 mg/h IV infusion as initial dose and titrate to desired effect by increasing 2.5 mg/h every 5 min to maximum of 15 mg/h;  If blood pressure is not controlled by labetolol or nicardipine, consider sodium nitroprusside.  Admission to ICU CT brain 24 hours post tPA NPO until swallowing screen performed and passed No antiplatelet agents or anticoagulants (including heparin for DVT prophylaxis) in first 24 hours No Foley catheter, nasogastric tube, arterial catheter or central venous catheter for 24 hr, unless absolutely necessary Telemetry  Inpatient Neurology Consultation Stroke evaluation as per inpatient neurology recommendations CTA  H and N and P w/w/o contrast r/o lvo and r/o ischemicpenumbra Discussed with ED MD   ------------------------------------------------------------------------------  CC SA  History of Present Illness   Patient is  a  77yo W w pmh of DM, astha, afib not on blood thinner, vaginal bleeding in July 7/19 none recent; htn and hld and prior stroke in 2017 w residual L HP who was last normal at 15:15  Diagnostic hct IMPRESSION: 1. No acute finding by CT. Extensive chronic small-vessel ischemic changes throughout the brain. Old infarction in the right basal ganglia and at the right parietal cortical and subcortical brain. 2. These results were called by telephone at the time of interpretation on 08/18/2018 at 5:22 pm to Dr. Noemi Chapel , who verbally acknowledged these results. Exam   NIHSS score: 25  Level of consciousness:2 LOC questions:2 LOC commands:2 Best Gaze:1 Visual:0 Facial palsy:1 Motor arm - left:2 Motor arm - right:3 Motor leg - left:2 Motor leg - right:3 Limb ataxia:0 Sensory:0 Best language:3 Dysarthria:2 Extinction and inattention:2   Medical Decision Making:  - Extensive number of diagnosis or management options are considered above.   - Extensive amount of complex data reviewed.   - High risk of complication and/or morbidity or mortality are associated with differential diagnostic considerations above.  - There may be Uncertain outcome and increased probability of prolonged functional impairment or high probability of severe prolonged functional impairment associated with some of these differential diagnosis.  Medical Data Reviewed:  1.Data reviewed include clinical labs, radiology,  Medical Tests;   2.Tests results discussed w/performing or interpreting physician;   3.Obtaining/reviewing old medical records;  4.Obtaining case history from another source;  5.Independent review of image, tracing or specimen.   Patient was informed the Neurology Consult would happen via TeleHealth consult by way of interactive audio and video telecommunications and consented to receiving care in this manner.

## 2018-08-18 NOTE — ED Provider Notes (Signed)
[facility] EMERGENCY DEPARTMENT Provider Note   CSN: [id number] Arrival date & time: 08/18/18  1706     History   Chief Complaint Chief Complaint  Patient presents with  . Code Stroke    HPI [name] is a 77 y.o. female.  HPI  The patient is a 77 year old female, she has multiple significant medical problems including a history of atrial fibrillation, a history of left-sided weakness after a acute ischemic infarct to the brain, history of dementia, diabetes and multiple other medical problems.  She presents with acute onset of symptoms which started at approximately 3:15 PM, she was at her nursing facility when her eyes were noted to be deviated to the left.  That was her last seen normal was at 315, it was a good time after that that she was found by staff and transported immediately to the hospital.  There was also reported some right-sided weakness, the patient is unable to give any history, level 5 caveat applies due to acuity of condition.  Past Medical History:  Diagnosis Date  . Acute embolism and thombos unsp deep vn unsp lower extremity ([place])   . Anemia   . Arthritis    osteoarthritis  . Asthma   . Back pain, chronic   . Chronic knee pain   . Cognitive communication deficit   . Dementia   . Diabetes mellitus   . Dysphagia   . Dysrhythmia    AFib  . Hemiplegia and hemiparesis following cerebral infarction affecting left non-dominant side ([place])   . HOH (hard of hearing)   . Hyperlipidemia   . Hypertension   . Obesity   . Paroxysmal A-fib ([place])   . Phonological disorder   . Phonological disorder   . PMB (postmenopausal bleeding) 11/26/2014  . Stroke [facility])    Mini Stroke; nleft sided hemiplegia  . Thickened endometrium 01/15/2015  . TIA (transient ischemic attack)   . Urge incontinence 11/26/2014  . Vertigo     Patient Active Problem List   Diagnosis Date Noted  . Endometrial hyperplasia   . Stroke (cerebrum) ([place]) 04/20/2016  . Slurred speech  04/20/2016  . Right knee pain 01/21/2016  . Spastic hemiplegia affecting nondominant side ([place]) 09/19/2015  . Type 2 diabetes mellitus with other circulatory complications ([place]) 83/15/1761  . Essential hypertension 07/29/2015  . HLD (hyperlipidemia) 07/29/2015  . Complex regional pain syndrome I of upper limb 06/27/2015  . Hemiparesis affecting left side as late effect of cerebrovascular accident ([place]) 06/27/2015  . Cerebral infarction due to embolism of right anterior cerebral artery ([place]) 05/14/2015  . Embolic cerebral infarction ([place]) 05/14/2015  . Cerebral infarction due to unspecified mechanism   . Paroxysmal atrial fibrillation (H[facility])   . Stroke ([place])   . Postmenopausal bleeding   . Left-sided weakness 05/10/2015  . CVA (cerebral vascular accident) ([place]) 05/10/2015  . Complex endometrial hyperplasia without atypia 02/11/2015  . Simple endometrial hyperplasia without atypia 02/11/2015  . Change in bowel habits 02/07/2015  . Thickened endometrium 01/15/2015  . PMB (postmenopausal bleeding) 11/26/2014  . Urge incontinence 11/26/2014  . Cerebral embolism with cerebral infarction ([place]) 07/27/2014  . Left leg weakness 07/26/2014  . Right frontal lobe lesion 07/26/2014  . Hyperlipidemia 03/13/2013  . Sleep apnea 03/13/2013  . New onset atrial fibrillation ([place]) 03/13/2013  . Near syncope 03/13/2013  . Atrial fibrillation ([place]) 03/12/2013  . HYPERCHOLESTEROLEMIA 05/09/2009  . SINUSITIS 05/09/2009  . BUNION, RIGHT FOOT 05/09/2009  . SHINGLES 11/07/2008  . SUPRASPINATUS TENDINITIS  08/28/2008  . DIABETES MELLITUS, CONTROLLED 08/21/2008  . SHOULDER PAIN, LEFT 08/21/2008  . SPRAIN AND STRAIN OF STERNOCLAVICULAR 07/04/2008  . OBESITY, MORBID 02/02/2008  . PROTEINURIA 02/02/2008  . DM (diabetes mellitus), type 2 with complications ([employer]) 09/81/1914  . ANEMIA 12/20/2007  . Essential hypertension, benign 12/20/2007  . ASTHMA 12/20/2007  . Unspecified arthropathy, lower leg 12/20/2007      Past Surgical History:  Procedure Laterality Date  . APPENDECTOMY    . CATARACT EXTRACTION W/PHACO Left 01/16/2013   Procedure: CATARACT EXTRACTION PHACO AND INTRAOCULAR LENS PLACEMENT (IOC);  Surgeon: Elta Guadeloupe T. Gershon Crane, MD;  Location: [facility] ORS;  Service: Ophthalmology;  Laterality: Left;  CDE:14.37  . colonoscopy  2009   Dr. Wilford Corner: 4 hyperplastic polyps removed. Small internal hemorrhoids. Recommended 5 year follow-up colonoscopy.  . COLONOSCOPY N/A 02/24/2015   Procedure: COLONOSCOPY;  Surgeon: Danie Binder, MD;  Location: [facility] ENDO SUITE;  Service: Endoscopy;  Laterality: N/A;  1030  . ENDOMETRIAL ABLATION    . ENDOMETRIAL BIOPSY  04/03/2013      . HYSTEROSCOPY W/D&C N/A 04/24/2013   Procedure: SUCTION DILATATION AND CURETTAGE /HYSTEROSCOPY;  Surgeon: Jonnie Kind, MD;  Location: [facility] ORS;  Service: Gynecology;  Laterality: N/A;  . HYSTEROSCOPY W/D&C N/A 04/02/2015   Procedure: UTERINE CURETTAGE, HYSTEROSCOPY;  Surgeon: Florian Buff, MD;  Location: [facility] ORS;  Service: Gynecology;  Laterality: N/A;  . KNEE ARTHROSCOPY WITH MEDIAL MENISECTOMY Left 07/26/2013   Procedure: KNEE ARTHROSCOPY WITH PARTIAL MEDIAL MENISECTOMY;  Surgeon: Sanjuana Kava, MD;  Location: [facility] ORS;  Service: Orthopedics;  Laterality: Left;  . OVARY SURGERY       OB History    Gravida  2   Para  2   Term  2   Preterm      AB      Living  2     SAB      TAB      Ectopic      Multiple      Live Births  2            Home Medications    Prior to Admission medications   Medication Sig Start Date End Date Taking? Authorizing Provider  acetaminophen (TYLENOL) 325 MG tablet Take 650 mg every 6 (six) hours as needed by mouth for mild pain or fever.     [provider]  allopurinol (ZYLOPRIM) 100 MG tablet Take 100 mg by mouth daily.    [provider]  diltiazem (CARDIZEM CD) 180 MG 24 hr capsule Take 1 capsule (180 mg total) by mouth daily. 09/19/15   Arnoldo Lenis, MD   diltiazem (CARDIZEM) 30 MG tablet Take 30 mg 2 (two) times daily by mouth. 08/21/17   [provider]  guaifenesin (HUMIBID E) 400 MG TABS tablet Take 400 mg every 12 (twelve) hours by mouth.    [provider]  guaiFENesin-dextromethorphan (ROBITUSSIN DM) 100-10 MG/5ML syrup Take 5 mLs by mouth every 4 (four) hours as needed for cough.    [provider]  ketorolac (TORADOL) 10 MG tablet Take 1 tablet (10 mg total) every 8 (eight) hours as needed by mouth. Patient not taking: Reported on 06/12/2018 10/19/17   Florian Buff, MD  megestrol (MEGACE) 40 MG/ML suspension Take 1 mL (40 mg total) by mouth daily. 10/26/17   Florian Buff, MD  Melatonin 5 MG CAPS Take 5 mg at bedtime by mouth.    [provider]  nitroGLYCERIN (NITROSTAT) 0.4  MG SL tablet Place 0.4 mg under the tongue every 5 (five) minutes as needed for chest pain.    [provider]  ondansetron (ZOFRAN ODT) 8 MG disintegrating tablet Take 1 tablet (8 mg total) every 8 (eight) hours as needed by mouth for nausea or vomiting. Patient not taking: Reported on 10/26/2017 10/19/17   Florian Buff, MD  OXYGEN Inhale 2 L as needed into the lungs (via nasal cannula for shortness of breath or O2 < 92%).    [provider]  scopolamine (TRANSDERM-SCOP) 1 MG/3DAYS Place 1 patch onto the skin every 3 (three) days.    [provider]  senna-docusate (SENNA PLUS) 8.6-50 MG tablet Take 1 tablet 2 (two) times daily by mouth.    [provider]  sulfamethoxazole-trimethoprim (BACTRIM DS,SEPTRA DS) 800-160 MG tablet Take 1 tablet by mouth 2 (two) times daily. Patient not taking: Reported on 06/12/2018 10/26/17   Florian Buff, MD    Family History Family History  Problem Relation Age of Onset  . Arrhythmia Father        Atrial fib/Pacemaker  . Heart failure Mother   . Diabetes Mother   . Other Mother        fell and broke hip  . Diabetes Sister   . Lupus Daughter   . Mental  illness Son   . ADD / ADHD Son   . Other Son        soft bones  . Diabetes Maternal Grandmother   . Stroke Paternal Grandmother   . Arthritis Paternal Grandfather   . Other Sister        MVA  . Hypertension Brother   . Diabetes Sister   . Hypertension Sister   . Colon cancer Neg Hx     Social History Social History   Tobacco Use  . Smoking status: Former Smoker    Packs/day: 0.25    Years: 30.00    Pack years: 7.50    Types: Cigarettes    Last attempt to quit: 05/20/2015    Years since quitting: 3.2  . Smokeless tobacco: Never Used  Substance Use Topics  . Alcohol use: No    Alcohol/week: 0.0 standard drinks  . Drug use: No     Allergies   Imitrex [sumatriptan]; Mango flavor; and Oysters [shellfish allergy]   Review of Systems Review of Systems  Unable to perform ROS: Acuity of condition     Physical Exam Updated Vital Signs LMP 03/31/2013   Physical Exam  Constitutional:  The patient is bedbound, she is ill-appearing, her eyes are deviated to the left, she is breathing spontaneously  HENT:  Patient will not open her mouth, she is breathing spontaneously, no drooling.  Eyes:  Pupils seem to be constricting appropriately, eyes are deviated to the left, they do cross the midline occasionally  Neck:  Neck is supple, no lymphadenopathy  Cardiovascular:  Tachycardia, irregularly irregular rhythm  Pulmonary/Chest:  Lungs are clear, no wheezing, mild tachypnea  Abdominal:  Abdomen is nontender, obese  Musculoskeletal:  Left upper extremity swollen, there is a splint on the left hand secondary to contracture, no edema of the legs  Neurological:  The patient is obtunded, there is a response to painful stimuli with grimace however the right side of the face appears to be drooping, the right arm is weak but does have a painful response, the right leg is also weak but has a painful response, cannot hold the legs up at all but cannot  lift either arm at all, cannot  speak.  Skin:  No rash seen     ED Treatments / Results  Labs (all labs ordered are listed, but only abnormal results are displayed) Labs Reviewed  CBC - Abnormal; Notable for the following components:      Result Value   MCV 100.5 (*)    MCH 34.6 (*)    All other components within normal limits  PROTIME-INR  APTT  DIFFERENTIAL  ETHANOL  COMPREHENSIVE METABOLIC PANEL  RAPID URINE DRUG SCREEN, HOSP PERFORMED  URINALYSIS, ROUTINE W REFLEX MICROSCOPIC  CBG MONITORING, ED  I-STAT CHEM 8, ED  I-STAT TROPONIN, ED    EKG None  Radiology Ct Head Code Stroke Wo Contrast  Result Date: 08/18/2018 CLINICAL DATA:  Code stroke.  Seizure this afternoon. EXAM: CT HEAD WITHOUT CONTRAST TECHNIQUE: Contiguous axial images were obtained from the base of the skull through the vertex without intravenous contrast. COMPARISON:  05/27/2016 FINDINGS: Brain: Generalized atrophy. Extensive chronic small-vessel ischemic changes throughout the white matter. Old infarction in the right basal ganglia and in the right parietal cortical and subcortical brain. No sign of acute infarction, mass lesion, hemorrhage, hydrocephalus or extra-axial collection. Vascular: There is atherosclerotic calcification of the major vessels at the base of the brain. Skull: Negative Sinuses/Orbits: Clear/normal Other: None Aspects not applicable in this case. IMPRESSION: 1. No acute finding by CT. Extensive chronic small-vessel ischemic changes throughout the brain. Old infarction in the right basal ganglia and at the right parietal cortical and subcortical brain. 2. These results were called by telephone at the time of interpretation on 08/18/2018 at 5:22 pm to Dr. Noemi Chapel , who verbally acknowledged these results. Electronically Signed   By: Nelson Chimes M.D.   On: 08/18/2018 17:24    Procedures .Critical Care Performed by: Noemi Chapel, MD Authorized by: Noemi Chapel, MD   Critical care provider statement:    Critical  care time (minutes):  35   Critical care time was exclusive of:  Separately billable procedures and treating other patients and teaching time   Critical care was necessary to treat or prevent imminent or life-threatening deterioration of the following conditions:  CNS failure or compromise   Critical care was time spent personally by me on the following activities:  Blood draw for specimens, development of treatment plan with patient or surrogate, discussions with consultants, evaluation of patient's response to treatment, examination of patient, obtaining history from patient or surrogate, ordering and performing treatments and interventions, ordering and review of laboratory studies, ordering and review of radiographic studies, pulse oximetry, re-evaluation of patient's condition and review of old charts   (including critical care time)  Medications Ordered in ED Medications - No data to display   Initial Impression / Assessment and Plan / ED Course  I have reviewed the triage vital signs and the nursing notes.  Pertinent labs & imaging results that were available during my care of the patient were reviewed by me and considered in my medical decision making (see chart for details).     The CT scan of the brain is unfortunately evidence of multiple ischemic infarcts.  There is no obvious acute finding according to the radiologist.  At this time the patient has been seen by the tele-neurologist and at approximately 5:40 PM the family arrived, I had a discussion with the daughter about the risk of using TPA however she is in the window and the tele-neurologist Dr. Roanna Simmons has recommended that the patient get TPA  if the family is in agreement.  There was a delay in giving TPA secondary to the ability to get a hold of family.  Patient is critically ill with an acute ischemic stroke.  The pt has received TPA' Has been accepted by Dr. Rory Simmons to the ICU at [facility] in [place] Pt is slightly  improving with some speech. Critically ill  Final Clinical Impressions(s) / ED Diagnoses   Final diagnoses:  Acute ischemic stroke [facility])  Atrial fibrillation with rapid ventricular response (HCC)      Noemi Chapel, MD 08/18/18 1843

## 2018-08-18 NOTE — ED Notes (Signed)
Family at bedside. 

## 2018-08-18 NOTE — ED Notes (Signed)
VO from RN stroke to give 20 cc more of tPa

## 2018-08-18 NOTE — ED Notes (Signed)
tPa complete  Pt will grip slightly with the R hand  Does not follow any other instructions

## 2018-08-18 NOTE — H&P (Signed)
Admission H&P    Chief Complaint: Stroke s/p tPA at OSH  HPI: [name] is an 77 y.o. female with a history of stroke in 2017 with residual left sided deficits who presented to the ED at [facility] on Friday after a possible seizure. LKN was 1515. EMS noted eye deviation at the time of arrival. She was nonverbal and not following commands. CBG was 100 en route to the ED.   On arrival to the ED it was felt that new right sided weakness was more likely to be due to an acute stroke and a Code Stroke was called. CT head revealed no acute finding; extensive chronic small-vessel ischemic changes throughout the brain, an old infarction in the right basal ganglia and at the right parietal cortical and subcortical brain were noted.Teleneurology was consulted. She was diagnosed with an acute stroke and tPA was administered. Her mRS is 4-5 which precluded endovascular intervention.   CTA of head and neck showed extensive severe intracranial atherosclerotic change, progressed relative to previous MRA from 2016. Findings included severe near occlusive proximal left M2 stenosis of superior division, additional severe M2 stenoses involving the inferior divisions bilaterally, short-segment severe mid right M1 stenosis, severe mid right A2 stenosis with essentially occlusion of the right ACA distally which had progressed from previous scan, and short-segment atheromatous stenosis of approximately 60% at the proximal right ICA. Noted was a widely patent vertebrobasilar system; moderate atheromatous change within the P2 segments was noted bilaterally.  After tPA, she was transferred to [facility] [place] for further stroke work up.   The patient has a diagnosis of dementia. PMHx also includes DM, atrial fibrillation not on blood thinner, vaginal bleeding in July 7/19, HLD and HTN.  At baseline the patient is able to communicate and "move all extremities".   LSN: [id number] tPA Given: Yes, at [facility]   Past Medical History:   Diagnosis Date  . Acute embolism and thombos unsp deep vn unsp lower extremity ([place])   . Anemia   . Arthritis    osteoarthritis  . Asthma   . Back pain, chronic   . Chronic knee pain   . Cognitive communication deficit   . Dementia   . Diabetes mellitus   . Dysphagia   . Dysrhythmia    AFib  . Hemiplegia and hemiparesis following cerebral infarction affecting left non-dominant side ([place])   . HOH (hard of hearing)   . Hyperlipidemia   . Hypertension   . Obesity   . Paroxysmal A-fib ([place])   . Phonological disorder   . Phonological disorder   . PMB (postmenopausal bleeding) 11/26/2014  . Stroke [facility])    Mini Stroke; nleft sided hemiplegia  . Thickened endometrium 01/15/2015  . TIA (transient ischemic attack)   . Urge incontinence 11/26/2014  . Vertigo     Past Surgical History:  Procedure Laterality Date  . APPENDECTOMY    . CATARACT EXTRACTION W/PHACO Left 01/16/2013   Procedure: CATARACT EXTRACTION PHACO AND INTRAOCULAR LENS PLACEMENT (IOC);  Surgeon: Elta Guadeloupe T. Gershon Crane, MD;  Location: [facility] ORS;  Service: Ophthalmology;  Laterality: Left;  CDE:14.37  . colonoscopy  2009   Dr. Wilford Corner: 4 hyperplastic polyps removed. Small internal hemorrhoids. Recommended 5 year follow-up colonoscopy.  . COLONOSCOPY N/A 02/24/2015   Procedure: COLONOSCOPY;  Surgeon: Danie Binder, MD;  Location: [facility] ENDO SUITE;  Service: Endoscopy;  Laterality: N/A;  1030  . ENDOMETRIAL ABLATION    . ENDOMETRIAL BIOPSY  04/03/2013      .  HYSTEROSCOPY W/D&C N/A 04/24/2013   Procedure: SUCTION DILATATION AND CURETTAGE /HYSTEROSCOPY;  Surgeon: Jonnie Kind, MD;  Location: [facility] ORS;  Service: Gynecology;  Laterality: N/A;  . HYSTEROSCOPY W/D&C N/A 04/02/2015   Procedure: UTERINE CURETTAGE, HYSTEROSCOPY;  Surgeon: Florian Buff, MD;  Location: [facility] ORS;  Service: Gynecology;  Laterality: N/A;  . KNEE ARTHROSCOPY WITH MEDIAL MENISECTOMY Left 07/26/2013   Procedure: KNEE ARTHROSCOPY WITH PARTIAL MEDIAL  MENISECTOMY;  Surgeon: Sanjuana Kava, MD;  Location: [facility] ORS;  Service: Orthopedics;  Laterality: Left;  . OVARY SURGERY      Family History  Problem Relation Age of Onset  . Arrhythmia Father        Atrial fib/Pacemaker  . Heart failure Mother   . Diabetes Mother   . Other Mother        fell and broke hip  . Diabetes Sister   . Lupus Daughter   . Mental illness Son   . ADD / ADHD Son   . Other Son        soft bones  . Diabetes Maternal Grandmother   . Stroke Paternal Grandmother   . Arthritis Paternal Grandfather   . Other Sister        MVA  . Hypertension Brother   . Diabetes Sister   . Hypertension Sister   . Colon cancer Neg Hx    Social History:  reports that she quit smoking about 3 years ago. Her smoking use included cigarettes. She has a 7.50 pack-year smoking history. She has never used smokeless tobacco. She reports that she does not drink alcohol or use drugs.  Allergies:  Allergies  Allergen Reactions  . Imitrex [Sumatriptan] Other (See Comments)    Unknown Reaction   . Mango Flavor Other (See Comments)    Nausea and vomiting  . Oysters [Shellfish Allergy] Other (See Comments)    Nausea and vomiting    Medications Prior to Admission  Medication Sig Dispense Refill  . acetaminophen (TYLENOL) 325 MG tablet Take 650 mg every 6 (six) hours as needed by mouth for mild pain or fever.     Marland Kitchen allopurinol (ZYLOPRIM) 100 MG tablet Take 100 mg by mouth daily.    Marland Kitchen diltiazem (CARDIZEM) 30 MG tablet Take 30 mg 2 (two) times daily by mouth.    Marland Kitchen guaiFENesin-dextromethorphan (ROBITUSSIN DM) 100-10 MG/5ML syrup Take 5 mLs by mouth every 4 (four) hours as needed for cough.    . megestrol (MEGACE) 40 MG/ML suspension Take 1 mL (40 mg total) by mouth daily. 240 mL 11  . Melatonin 5 MG CAPS Take 5 mg at bedtime by mouth.    . nitroGLYCERIN (NITROSTAT) 0.4 MG SL tablet Place 0.4 mg under the tongue every 5 (five) minutes as needed for chest pain.    Marland Kitchen senna-docusate (SENNA  PLUS) 8.6-50 MG tablet Take 1 tablet 2 (two) times daily by mouth.    . guaifenesin (HUMIBID E) 400 MG TABS tablet Take 400 mg every 12 (twelve) hours by mouth.    . OXYGEN Inhale 2 L as needed into the lungs (via nasal cannula for shortness of breath or O2 < 92%).      ROS: Unable to obtain due to aphasia.   Physical Examination: Blood pressure (!) 154/79, pulse 72, resp. rate 15, weight 108.5 kg, last menstrual period 03/31/2013, SpO2 96 %.  HEENT-  /AT. Neck supple. Heart: Irregularly irregular rhythm. No murmur. Lungs -  CTA right apex anteriorly and CTA right posterior apical field; decreased  breath sounds left apex.and left posterior apical field. Unable to ausculated b.s at bases in the context of morbid obesity Abdomen - NT/ND Extremities - Brace to LUE. No edema.   Neurologic Examination: Mental Status: Initially asleep, with repeated stimulation arouses to a drowsy then awake state. Decreased level of alertness. Globally aphasic. Will track examiner visually. No attempts to communicate nonverbally. Squeezes examiner's hand when asked, otherwise not following commands.  Cranial Nerves: II:  Blinks to threat bilaterally. PERRL.  III,IV, VI: Will track examiner's face to left and right. Saccadic quality to visual pursuits is noted. Has a mild right gaze preference but crossed midline to left.  V,VII: Mild left facial droop. Noxious brow ridge pressure deferred.  VIII: Will gaze towards examiner when name is called IX,X: Unable to visualize palate XI: Slight rightward head rotation at baseline XII: Does not protrude tongue to command Motor/Sensory: LUE: Increased flexor tone to LUE. No movement to command. Has brace.  RUE: Will squeeze examiner's hand and resist with flexion at elbow 3-4/5. Arm drops slowly to bed when elevated antigravity LLE: Increased extensor tone. Does not move to command. Withdraws 2/5 to noxiuos RLE: Withdraws more briskly than on left to noxious but does  not lift antigravity.  Deep Tendon Reflexes:  Pathologically brisk low amplitude brachioradialis on the left.  2-3+ right brachioradialis 3+ patellar on the left 2+ patellar on the right Left toe upgoing Right toe downgoing Cerebellar: Not following commands for cerebellar testing.  Gait: Unable to assess  Results for orders placed or performed during the hospital encounter of 08/18/18 (from the past 48 hour(s))  Ethanol     Status: None   Collection Time: 08/18/18  5:15 PM  Result Value Ref Range   Alcohol, Ethyl (B) <10 <10 mg/dL    Comment: Performed at [facility], [address] [place], [place] [address]  Protime-INR     Status: None   Collection Time: 08/18/18  5:15 PM  Result Value Ref Range   Prothrombin Time 13.8 11.4 - 15.2 seconds   INR 1.07     Comment: Performed at [facility], [address] [place], [place] [address]  APTT     Status: None   Collection Time: 08/18/18  5:15 PM  Result Value Ref Range   aPTT 29 24 - 36 seconds    Comment: Performed at [facility], [address], [place], [place] [address]  CBC     Status: Abnormal   Collection Time: 08/18/18  5:15 PM  Result Value Ref Range   WBC 5.9 4.0 - 10.5 K/uL   RBC 4.10 3.87 - 5.11 MIL/uL   Hemoglobin 14.2 12.0 - 15.0 g/dL   HCT 41.2 36.0 - 46.0 %   MCV 100.5 (H) 78.0 - 100.0 fL   MCH 34.6 (H) 26.0 - 34.0 pg   MCHC 34.5 30.0 - 36.0 g/dL   RDW 12.9 11.5 - 15.5 %   Platelets 213 150 - 400 K/uL    Comment: Performed at [facility], [address], [place], [place] [address]  Differential     Status: None   Collection Time: 08/18/18  5:15 PM  Result Value Ref Range   Neutrophils Relative % 49 %   Neutro Abs 2.9 1.7 - 7.7 K/uL   Lymphocytes Relative 38 %   Lymphs Abs 2.3 0.7 - 4.0 K/uL   Monocytes Relative 12 %   Monocytes Absolute 0.7 0.1 - 1.0 K/uL   Eosinophils Relative 1 %   Eosinophils  Absolute 0.1 0.0 - 0.7 K/uL   Basophils Relative 0 %   Basophils Absolute 0.0 0.0 - 0.1 K/uL    Comment:  Performed at [facility], [address], [place], [place] [address]  Comprehensive metabolic panel     Status: Abnormal   Collection Time: 08/18/18  5:15 PM  Result Value Ref Range   Sodium 139 135 - 145 mmol/L   Potassium 4.2 3.5 - 5.1 mmol/L   Chloride 108 98 - 111 mmol/L   CO2 25 22 - 32 mmol/L   Glucose, Bld 96 70 - 99 mg/dL   BUN 19 8 - 23 mg/dL   Creatinine, Ser 1.26 (H) 0.44 - 1.00 mg/dL   Calcium 8.7 (L) 8.9 - 10.3 mg/dL   Total Protein 7.3 6.5 - 8.1 g/dL   Albumin 3.1 (L) 3.5 - 5.0 g/dL   AST 19 15 - 41 U/L   ALT 25 0 - 44 U/L   Alkaline Phosphatase 56 38 - 126 U/L   Total Bilirubin 0.5 0.3 - 1.2 mg/dL   GFR calc non Af Amer 41 (L) >60 mL/min   GFR calc Af Amer 47 (L) >60 mL/min    Comment: (NOTE) The eGFR has been calculated using the CKD EPI equation. This calculation has not been validated in all clinical situations. eGFR's persistently <60 mL/min signify possible Chronic Kidney Disease.    Anion gap 6 5 - 15    Comment: Performed at [facility], [address], [place], [place] [address]  I-stat troponin, ED     Status: None   Collection Time: 08/18/18  5:30 PM  Result Value Ref Range   Troponin i, poc 0.01 0.00 - 0.08 ng/mL   Comment 3            Comment: Due to the release kinetics of cTnI, a negative result within the first hours of the onset of symptoms does not rule out myocardial infarction with certainty. If myocardial infarction is still suspected, repeat the test at appropriate intervals.   I-Stat Chem 8, ED     Status: Abnormal   Collection Time: 08/18/18  5:31 PM  Result Value Ref Range   Sodium 141 135 - 145 mmol/L   Potassium 4.2 3.5 - 5.1 mmol/L   Chloride 108 98 - 111 mmol/L   BUN 20 8 - 23 mg/dL   Creatinine, Ser 1.30 (H) 0.44 - 1.00 mg/dL   Glucose, Bld 90 70 - 99 mg/dL   Calcium, Ion 1.20 1.15 - 1.40 mmol/L   TCO2 24 22 - 32 mmol/L   Hemoglobin 13.9 12.0 - 15.0 g/dL   HCT 41.0 36.0 - 46.0 %  CBG monitoring, ED     Status: None    Collection Time: 08/18/18  5:33 PM  Result Value Ref Range   Glucose-Capillary 82 70 - 99 mg/dL   Ct Angio Head W Or Wo Contrast  Result Date: 08/18/2018 CLINICAL DATA:  Initial evaluation for focal neural deficit. EXAM: CT ANGIOGRAPHY HEAD AND NECK TECHNIQUE: Multidetector CT imaging of the head and neck was performed using the standard protocol during bolus administration of intravenous contrast. Multiplanar CT image reconstructions and MIPs were obtained to evaluate the vascular anatomy. Carotid stenosis measurements (when applicable) are obtained utilizing NASCET criteria, using the distal internal carotid diameter as the denominator. CONTRAST:  131m ISOVUE-370 IOPAMIDOL (ISOVUE-370) INJECTION 76% COMPARISON:  Prior noncontrast head CT from earlier the same day. FINDINGS: CTA NECK FINDINGS Aortic arch: Visualized aortic arch of normal caliber  with normal branch pattern. Incidental note made of a bovine arch with common origin of the right brachiocephalic and left common carotid artery. Mild plaque about the origin of the great vessels without significant stenosis. Visualized subclavian arteries widely patent. Right carotid system: Right common carotid artery widely patent from its origin to the bifurcation. A centric calcified plaque at the proximal right ICA with associated stenosis of up to 60% by NASCET criteria. Right ICA mildly tortuous but widely patent distally to the skull base without stenosis, dissection, or occlusion. Left carotid system: Left common carotid artery patent from its origin to the bifurcation. A centric calcified plaque about the left bifurcation without hemodynamically significant stenosis. Left ICA mildly tortuous but widely patent to the skull base without stenosis, dissection, or occlusion. Vertebral arteries: Both of the vertebral arteries arise from the subclavian arteries. Vertebral arteries widely patent within the neck without stenosis, dissection, or occlusion. Skeleton:  No acute osseus abnormality. No discrete lytic or blastic osseous lesions. Other neck: No acute soft tissue abnormality within the neck. 1.6 cm left lobe of thyroid nodule noted. Upper chest: Visualized upper chest within normal limits. Partially visualized lungs are clear. Review of the MIP images confirms the above findings CTA HEAD FINDINGS Anterior circulation: Petrous segments widely patent bilaterally. Scattered atheromatous plaque within the cavernous/supraclinoid ICAs with relatively mild narrowing on the right and more moderate stenosis on the left. ICA termini patent. A1 segments patent bilaterally. Hypoplastic right A1. Patent and normal anterior communicating artery. Left ACA patent to its distal aspect. Severe stenosis with essentially occlusion of the mid right A2 segment at the takeoff of the pericallosal artery (no significant flow seen distally. This is progressed in appearance from previous. Left M1 patent proximally. Left MCA bifurcates early. Severe M2 stenosis at the origin of the inferior division. Additional severe near occlusive multifocal stenoses involving the left M2 superior division with near occlusion (series 10, image 18). Flow is seen distally. Additional moderate to advance multifocal stenoses in atheromatous irregularity within the left MCA branches distally. Focal severe proximal right M1 stenosis (series 7, image 91), just distal to the takeoff of the anterior temporal branch. This is similar to previous. Severe right M2 stenosis at the proximal anterior temporal branch. Extensive atheromatous change seen distally within the right MCA branches with multifocal moderate to severe stenoses. Posterior circulation: Vertebral arteries patent to the vertebrobasilar junction without stenosis. Posterior inferior cerebral arteries not well seen bilaterally. Basilar artery widely patent to its distal aspect. Superior cerebral arteries patent bilaterally. Basilar tip ectatic without frank  aneurysm. Both of the PCA supplied via the basilar. Focal calcified plaque within the proximal left P2 segment with moderate stenosis. DISH in ule atheromatous irregularity throughout the P2 segments bilaterally without high-grade stenosis. PCAs are patent to their distal aspects. Venous sinuses: Grossly patent, although not well assessed due to timing of the contrast bolus. Anatomic variants: None significant. Delayed phase: No abnormal enhancement. Review of the MIP images confirms the above findings IMPRESSION: 1. Extensive severe intracranial atherosclerotic change as detailed above, progressed relative to previous MRA from 2016. 2. Severe near occlusive proximal left M2 stenosis, superior division. Additional severe M2 stenoses involving the inferior divisions bilaterally. 3. Short-segment severe mid right M1 stenosis. 4. Severe mid right A2 stenosis with essentially occlusion of the right ACA distally. This is progressed from previous. 5. Short-segment atheromatous stenosis of approximately 60% at the proximal right ICA. 6. Widely patent vertebrobasilar system. Moderate atheromatous change within the P2 segments bilaterally.  Electronically Signed   By: Jeannine Boga M.D.   On: 08/18/2018 19:12   Ct Angio Neck W Or Wo Contrast  Result Date: 08/18/2018 CLINICAL DATA:  Initial evaluation for focal neural deficit. EXAM: CT ANGIOGRAPHY HEAD AND NECK TECHNIQUE: Multidetector CT imaging of the head and neck was performed using the standard protocol during bolus administration of intravenous contrast. Multiplanar CT image reconstructions and MIPs were obtained to evaluate the vascular anatomy. Carotid stenosis measurements (when applicable) are obtained utilizing NASCET criteria, using the distal internal carotid diameter as the denominator. CONTRAST:  133m ISOVUE-370 IOPAMIDOL (ISOVUE-370) INJECTION 76% COMPARISON:  Prior noncontrast head CT from earlier the same day. FINDINGS: CTA NECK FINDINGS Aortic  arch: Visualized aortic arch of normal caliber with normal branch pattern. Incidental note made of a bovine arch with common origin of the right brachiocephalic and left common carotid artery. Mild plaque about the origin of the great vessels without significant stenosis. Visualized subclavian arteries widely patent. Right carotid system: Right common carotid artery widely patent from its origin to the bifurcation. A centric calcified plaque at the proximal right ICA with associated stenosis of up to 60% by NASCET criteria. Right ICA mildly tortuous but widely patent distally to the skull base without stenosis, dissection, or occlusion. Left carotid system: Left common carotid artery patent from its origin to the bifurcation. A centric calcified plaque about the left bifurcation without hemodynamically significant stenosis. Left ICA mildly tortuous but widely patent to the skull base without stenosis, dissection, or occlusion. Vertebral arteries: Both of the vertebral arteries arise from the subclavian arteries. Vertebral arteries widely patent within the neck without stenosis, dissection, or occlusion. Skeleton: No acute osseus abnormality. No discrete lytic or blastic osseous lesions. Other neck: No acute soft tissue abnormality within the neck. 1.6 cm left lobe of thyroid nodule noted. Upper chest: Visualized upper chest within normal limits. Partially visualized lungs are clear. Review of the MIP images confirms the above findings CTA HEAD FINDINGS Anterior circulation: Petrous segments widely patent bilaterally. Scattered atheromatous plaque within the cavernous/supraclinoid ICAs with relatively mild narrowing on the right and more moderate stenosis on the left. ICA termini patent. A1 segments patent bilaterally. Hypoplastic right A1. Patent and normal anterior communicating artery. Left ACA patent to its distal aspect. Severe stenosis with essentially occlusion of the mid right A2 segment at the takeoff of the  pericallosal artery (no significant flow seen distally. This is progressed in appearance from previous. Left M1 patent proximally. Left MCA bifurcates early. Severe M2 stenosis at the origin of the inferior division. Additional severe near occlusive multifocal stenoses involving the left M2 superior division with near occlusion (series 10, image 18). Flow is seen distally. Additional moderate to advance multifocal stenoses in atheromatous irregularity within the left MCA branches distally. Focal severe proximal right M1 stenosis (series 7, image 91), just distal to the takeoff of the anterior temporal branch. This is similar to previous. Severe right M2 stenosis at the proximal anterior temporal branch. Extensive atheromatous change seen distally within the right MCA branches with multifocal moderate to severe stenoses. Posterior circulation: Vertebral arteries patent to the vertebrobasilar junction without stenosis. Posterior inferior cerebral arteries not well seen bilaterally. Basilar artery widely patent to its distal aspect. Superior cerebral arteries patent bilaterally. Basilar tip ectatic without frank aneurysm. Both of the PCA supplied via the basilar. Focal calcified plaque within the proximal left P2 segment with moderate stenosis. DISH in ule atheromatous irregularity throughout the P2 segments bilaterally without high-grade stenosis. PCAs  are patent to their distal aspects. Venous sinuses: Grossly patent, although not well assessed due to timing of the contrast bolus. Anatomic variants: None significant. Delayed phase: No abnormal enhancement. Review of the MIP images confirms the above findings IMPRESSION: 1. Extensive severe intracranial atherosclerotic change as detailed above, progressed relative to previous MRA from 2016. 2. Severe near occlusive proximal left M2 stenosis, superior division. Additional severe M2 stenoses involving the inferior divisions bilaterally. 3. Short-segment severe mid right  M1 stenosis. 4. Severe mid right A2 stenosis with essentially occlusion of the right ACA distally. This is progressed from previous. 5. Short-segment atheromatous stenosis of approximately 60% at the proximal right ICA. 6. Widely patent vertebrobasilar system. Moderate atheromatous change within the P2 segments bilaterally. Electronically Signed   By: Jeannine Boga M.D.   On: 08/18/2018 19:12   Ct Head Code Stroke Wo Contrast  Result Date: 08/18/2018 CLINICAL DATA:  Code stroke.  Seizure this afternoon. EXAM: CT HEAD WITHOUT CONTRAST TECHNIQUE: Contiguous axial images were obtained from the base of the skull through the vertex without intravenous contrast. COMPARISON:  05/27/2016 FINDINGS: Brain: Generalized atrophy. Extensive chronic small-vessel ischemic changes throughout the white matter. Old infarction in the right basal ganglia and in the right parietal cortical and subcortical brain. No sign of acute infarction, mass lesion, hemorrhage, hydrocephalus or extra-axial collection. Vascular: There is atherosclerotic calcification of the major vessels at the base of the brain. Skull: Negative Sinuses/Orbits: Clear/normal Other: None Aspects not applicable in this case. IMPRESSION: 1. No acute finding by CT. Extensive chronic small-vessel ischemic changes throughout the brain. Old infarction in the right basal ganglia and at the right parietal cortical and subcortical brain. 2. These results were called by telephone at the time of interpretation on 08/18/2018 at 5:22 pm to Dr. Noemi Chapel , who verbally acknowledged these results. Electronically Signed   By: Nelson Chimes M.D.   On: 08/18/2018 17:24    Assessment: 77 y.o. female with a history of stroke in 2017 with residual left sided deficits who presented to the ED at [facility] on Friday with acute onset of right sided weakness and aphasia.  1. S/P tPA. 2. Was not a VIR candidate due to mRS of 4-5.  3. Stroke Risk Factors - prior stroke, HLD,  HTN, age, morbid obesity, DM, paroxysmal atrial fibrillation 4. DDx for stroke etiology includes cardioembolic stroke, small vessel disease/lacune, thromboembolic, artery-to-artery mechanism, hypercoagulable state-related infarct, thrombotic mechanism, large artery disease  5. DM.  6. Dementia. 7. EKG: Atrial fibrillation  8. CTA of head and neck: Extensive severe intracranial atherosclerotic change, progressed relative to previous MRA from 2016. Findings included severe near occlusive proximal left M2 stenosis of superior division, additional severe M2 stenoses involving the inferior divisions bilaterally, short-segment severe mid right M1 stenosis, severe mid right A2 stenosis with essentially occlusion of the right ACA distally which had progressed from previous scan, and short-segment atheromatous stenosis of approximately 60% at the proximal right ICA. Noted was a widely patent vertebrobasilar system; moderate atheromatous change within the P2 segments was noted bilaterally.  Recommendations: 1. Admitted to Neuro ICU.  2. Post-tPA order set to include frequent neuro checks and BP management.  3. Repeat CT in 24 hours following tPA to rule out hemorrhagic conversion. No antiplatelet medications or anticoagulants for at least 24 hours following tPA.  4. DVT prophylaxis with SCDs.  5. Given age and overall debility, potential benefits of statin are most likely outweighed by risks  6. Has a history of  paroxysmal atrial fibrillation but was not on ASA or anticoagulation based on medications list in Epic. She had vaginal bleeding in July 7/19 and therefore may be at risk for further bleeding is started on an anticoagulant, but benefit/risk profile may favor starting ASA if no hemorrhage on follow up CT at 24 hours following tPA.  7. Carotid ultrasound   8. TTE.  9. MRI brain.  10. PT/OT/Speech.  11. NPO until passes swallow evaluation.  12. Sliding scale insulin.  13. Telemetry monitoring 14.  Fasting lipid panel, HgbA1c 15. Family wishes her to be DNR 27. CXR to assess diminished breath sounds on the left, which may also be secondary to difficulty auscultating in the setting of morbid obesity.   55 minutes spent in the evaluation and management of this critically ill patient who is admitted with acute stroke s/p tPA administration  Electronically signed: Dr. Kerney Elbe 08/18/2018, 8:40 PM

## 2018-08-18 NOTE — ED Notes (Signed)
Pt from [place], hx pf multiple stroke, a fib and DNR at [facility]  Here with [place]deen onset of inability to respond, move her R arm

## 2018-08-18 NOTE — ED Notes (Signed)
TeleNeuro RN consulted facility and states pt received mylanta approximately 1515. Pt at baseline at this time. Pt has left sided defecits from stroke in 2017.

## 2018-08-18 NOTE — ED Notes (Signed)
Pt in CT for CT angio head and neck. Unable to obtain 18 gauge access for CT perfusion. EDP & Neurologist aware.   Pt had emesis episode x1. Pt suctioned and turned on side. Minimal output noted in canister. Airway patent.  Pt repositioned and CT head and neck scan continued.

## 2018-08-19 ENCOUNTER — Encounter (HOSPITAL_COMMUNITY): Payer: Self-pay | Admitting: Radiology

## 2018-08-19 ENCOUNTER — Inpatient Hospital Stay (HOSPITAL_COMMUNITY): Payer: Medicare HMO

## 2018-08-19 ENCOUNTER — Encounter (HOSPITAL_COMMUNITY): Payer: Medicare HMO

## 2018-08-19 DIAGNOSIS — Z8673 Personal history of transient ischemic attack (TIA), and cerebral infarction without residual deficits: Secondary | ICD-10-CM

## 2018-08-19 DIAGNOSIS — E785 Hyperlipidemia, unspecified: Secondary | ICD-10-CM

## 2018-08-19 LAB — GLUCOSE, CAPILLARY
Glucose-Capillary: 128 mg/dL — ABNORMAL HIGH (ref 70–99)
Glucose-Capillary: 61 mg/dL — ABNORMAL LOW (ref 70–99)
Glucose-Capillary: 75 mg/dL (ref 70–99)
Glucose-Capillary: 81 mg/dL (ref 70–99)
Glucose-Capillary: 82 mg/dL (ref 70–99)
Glucose-Capillary: 96 mg/dL (ref 70–99)

## 2018-08-19 LAB — HEMOGLOBIN A1C
Hgb A1c MFr Bld: 5.5 % (ref 4.8–5.6)
Mean Plasma Glucose: 111.15 mg/dL

## 2018-08-19 LAB — LIPID PANEL
Cholesterol: 198 mg/dL (ref 0–200)
HDL: 45 mg/dL (ref >40)
LDL Cholesterol: 143 mg/dL — ABNORMAL HIGH (ref 0–99)
Total CHOL/HDL Ratio: 4.4 RATIO
Triglycerides: 52 mg/dL (ref <150)
VLDL: 10 mg/dL (ref 0–40)

## 2018-08-19 LAB — MRSA PCR SCREENING: MRSA by PCR: NEGATIVE

## 2018-08-19 MED ORDER — MEGESTROL ACETATE 40 MG PO TABS
40.0000 mg | ORAL_TABLET | Freq: Every day | ORAL | Status: DC
  Administered 2018-08-19 – 2018-08-24 (×6): 40 mg via ORAL
  Filled 2018-08-19 (×6): qty 1

## 2018-08-19 MED ORDER — DEXTROSE-NACL 5-0.9 % IV SOLN
INTRAVENOUS | Status: DC
  Administered 2018-08-19 (×2): 1000 mL via INTRAVENOUS
  Administered 2018-08-20 – 2018-08-21 (×2): via INTRAVENOUS
  Administered 2018-08-21: 1000 mL via INTRAVENOUS
  Administered 2018-08-22 – 2018-08-23 (×2): via INTRAVENOUS
  Administered 2018-08-23: 1000 mL via INTRAVENOUS

## 2018-08-19 MED ORDER — DEXTROSE 50 % IV SOLN
INTRAVENOUS | Status: AC
  Administered 2018-08-19: 25 mL
  Filled 2018-08-19: qty 50

## 2018-08-19 MED ORDER — LABETALOL HCL 5 MG/ML IV SOLN: 20.0000 mg | Freq: Once | INTRAVENOUS | Status: DC

## 2018-08-19 MED ORDER — INSULIN ASPART 100 UNIT/ML ~~LOC~~ SOLN
0.0000 [IU] | SUBCUTANEOUS | Status: DC
  Administered 2018-08-20: 3 [IU] via SUBCUTANEOUS

## 2018-08-19 MED ORDER — ORAL CARE MOUTH RINSE
15.0000 mL | Freq: Two times a day (BID) | OROMUCOSAL | Status: DC
  Administered 2018-08-19 – 2018-08-24 (×5): 15 mL via OROMUCOSAL

## 2018-08-19 MED ORDER — ORAL CARE MOUTH RINSE
15.0000 mL | Freq: Two times a day (BID) | OROMUCOSAL | Status: DC
  Administered 2018-08-19 – 2018-08-24 (×8): 15 mL via OROMUCOSAL

## 2018-08-19 MED ORDER — ATORVASTATIN CALCIUM 40 MG PO TABS
40.0000 mg | ORAL_TABLET | Freq: Every day | ORAL | Status: DC
  Administered 2018-08-20 – 2018-08-22 (×3): 40 mg via ORAL
  Filled 2018-08-19 (×3): qty 1

## 2018-08-19 MED ORDER — CLEVIDIPINE BUTYRATE 0.5 MG/ML IV EMUL
0.0000 mg/h | INTRAVENOUS | Status: DC | PRN
  Filled 2018-08-19: qty 50

## 2018-08-19 NOTE — Evaluation (Signed)
Speech Language Pathology Evaluation Patient Details Name: [name] MRN: [id number] DOB: 06/19/1941 Today's Date: 08/19/2018 Time: 1002-1008 SLP Time Calculation (min) (ACUTE ONLY): 6 min  Problem List:  Patient Active Problem List   Diagnosis Date Noted  . Acute ischemic stroke ([place]) 08/18/2018  . Endometrial hyperplasia   . Stroke (cerebrum) ([place]) 04/20/2016  . Slurred speech 04/20/2016  . Right knee pain 01/21/2016  . Spastic hemiplegia affecting nondominant side ([place]) 09/19/2015  . Type 2 diabetes mellitus with other circulatory complications ([place]) 56/21/3086  . Essential hypertension 07/29/2015  . HLD (hyperlipidemia) 07/29/2015  . Complex regional pain syndrome I of upper limb 06/27/2015  . Hemiparesis affecting left side as late effect of cerebrovascular accident ([place]) 06/27/2015  . 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 Embolic cerebral infarction ([place]) 05/14/2015  . Cerebral infarction due to unspecified mechanism   . Paroxysmal atrial fibrillation ([facility])   . Stroke ([place])   . Postmenopausal bleeding   . Left-sided weakness 05/10/2015  . CVA (cerebral vascular accident) ([place]) 05/10/2015  . Complex endometrial hyperplasia without atypia 02/11/2015  . Simple endometrial hyperplasia without atypia 02/11/2015  . Change in bowel habits 02/07/2015  . Thickened endometrium 01/15/2015  . PMB (postmenopausal bleeding) 11/26/2014  . Urge incontinence 11/26/2014  . Cerebral embolism with cerebral infarction ([place]) 07/27/2014  . Left leg weakness 07/26/2014  . Right frontal lobe lesion 07/26/2014  . Hyperlipidemia 03/13/2013  . Sleep apnea 03/13/2013  . New onset atrial fibrillation ([place]) 03/13/2013  . Near syncope 03/13/2013  . Atrial fibrillation ([place]) 03/12/2013  . HYPERCHOLESTEROLEMIA 05/09/2009  . SINUSITIS 05/09/2009  . BUNION, RIGHT FOOT 05/09/2009  . SHINGLES 11/07/2008  . SUPRASPINATUS TENDINITIS 08/28/2008  .  DIABETES MELLITUS, CONTROLLED 08/21/2008  . SHOULDER PAIN, LEFT 08/21/2008  . SPRAIN AND STRAIN OF STERNOCLAVICULAR 07/04/2008  . OBESITY, MORBID 02/02/2008  . PROTEINURIA 02/02/2008  . DM (diabetes mellitus), type 2 with complications ([place]) 57/84/6962  . ANEMIA 12/20/2007  . Essential hypertension, benign 12/20/2007  . ASTHMA 12/20/2007  . Unspecified arthropathy, lower leg 12/20/2007   Past Medical History:  Past Medical History:  Diagnosis Date  . Acute embolism and thombos unsp deep vn unsp lower extremity ([place])   . Anemia   . Arthritis    osteoarthritis  . Asthma   . Back pain, chronic   . Chronic knee pain   . Cognitive communication deficit   . Dementia   . Diabetes mellitus   . Dysphagia   . Dysrhythmia    AFib  . Hemiplegia and hemiparesis following cerebral infarction affecting left non-dominant side ([place])   . HOH (hard of hearing)   . Hyperlipidemia   . Hypertension   . Obesity   . Paroxysmal A-fib ([place])   . Phonological disorder   . Phonological disorder   . PMB (postmenopausal bleeding) 11/26/2014  . Stroke [facility])    Mini Stroke; nleft sided hemiplegia  . Thickened endometrium 01/15/2015  . TIA (transient ischemic attack)   . Urge incontinence 11/26/2014  . Vertigo    Past Surgical History:  Past Surgical History:  Procedure Laterality Date  . APPENDECTOMY    . CATARACT EXTRACTION W/PHACO Left 01/16/2013   Procedure: CATARACT EXTRACTION PHACO AND INTRAOCULAR LENS PLACEMENT (IOC);  Surgeon: Elta Guadeloupe T. Gershon Crane, MD;  Location: [facility] ORS;  Service: Ophthalmology;  Laterality: Left;  CDE:14.37  . colonoscopy  2009   Dr. Wilford Corner: 4 hyperplastic polyps removed. Small internal hemorrhoids. Recommended 5 year  follow-up colonoscopy.  . COLONOSCOPY N/A 02/24/2015   Procedure: COLONOSCOPY;  Surgeon: Danie Binder, MD;  Location: [facility] ENDO SUITE;  Service: Endoscopy;  Laterality: N/A;  1030  . ENDOMETRIAL ABLATION    . ENDOMETRIAL BIOPSY  04/03/2013      .  HYSTEROSCOPY W/D&C N/A 04/24/2013   Procedure: SUCTION DILATATION AND CURETTAGE /HYSTEROSCOPY;  Surgeon: Jonnie Kind, MD;  Location: [facility] ORS;  Service: Gynecology;  Laterality: N/A;  . HYSTEROSCOPY W/D&C N/A 04/02/2015   Procedure: UTERINE CURETTAGE, HYSTEROSCOPY;  Surgeon: Florian Buff, MD;  Location: [facility] ORS;  Service: Gynecology;  Laterality: N/A;  . KNEE ARTHROSCOPY WITH MEDIAL MENISECTOMY Left 07/26/2013   Procedure: KNEE ARTHROSCOPY WITH PARTIAL MEDIAL MENISECTOMY;  Surgeon: Sanjuana Kava, MD;  Location: [facility] ORS;  Service: Orthopedics;  Laterality: Left;  . OVARY SURGERY     HPI:  [name] is an 77 y.o. female with a history of stroke in 2017 with residual left sided deficits who presented to the ED at [facility] after a possible seizure, nonverbal and new right sided weakness. Code stroke called. CT head revealed no acute finding; extensive chronic small-vessel ischemic changes throughout the brain, an old infarction in the right basal ganglia and at the right parietal cortical and subcortical. tPA was administered. MRI pending. CXR Low lung volumes with cardiomegaly and scattered atelectasis. No prior BSE documentation found.   Assessment / Plan / Recommendation Clinical Impression  Global aphasia demonstrated with various multimodal cues provided for basic communciation at single phoneme production, simple/one step commands and responses. No spontaneous gestures to express or respond to therapist were observed. Vocalization not initiated during automatic speech activities and suspect possible oral/vocal apraxia. She recognized a cup/spoon and demonstrated appropriate use. Affect flat and pt unable to mimick facial expressions. No family available to provide insight into baseline communicative/cogntive status. ST will continue on acute care venue.       SLP Assessment  SLP Recommendation/Assessment: Patient needs continued Speech Lanaguage Pathology Services SLP Visit Diagnosis:  Aphonia (R49.1);Cognitive communication deficit (R41.841);Aphasia (R47.01)    Follow Up Recommendations  Skilled Nursing facility    Frequency and Duration min 2x/week  2 weeks      SLP Evaluation Cognition  Overall Cognitive Status: No family/caregiver present to determine baseline cognitive functioning(suspect different than baseline) Arousal/Alertness: Awake/alert Orientation Level: (no response y/n questions) Attention: Sustained Sustained Attention: Impaired Sustained Attention Impairment: Functional basic Problem Solving: Impaired Problem Solving Impairment: Functional basic Safety/Judgment: Impaired       Comprehension  Auditory Comprehension Overall Auditory Comprehension: Impaired Yes/No Questions: (no response y/n ?'s) Commands: Impaired One Step Basic Commands: 0-24% accurate Visual Recognition/Discrimination Discrimination: Not tested Reading Comprehension Reading Status: Not tested    Expression Expression Primary Mode of Expression: (no gesture or vocal) Verbal Expression Overall Verbal Expression: Impaired Initiation: Impaired Automatic Speech: (no response) Repetition: Impaired Level of Impairment: (phoneme) Naming: Impairment Responsive: 0-25% accurate Confrontation: Impaired Pragmatics: Impairment Impairments: Abnormal affect;Eye contact Interfering Components: Attention Written Expression Dominant Hand: (left paresis prior stroke) Written Expression: Not tested   Oral / Motor  Oral Motor/Sensory Function Overall Oral Motor/Sensory Function: Other (comment)(right facial appears weak, unable to perform movements) Motor Speech Overall Motor Speech: Impaired Phonation: Aphonic Motor Planning: Impaired Motor Speech Errors: Unaware   GO                    [name] 08/19/2018, 11:35 AM   [name].Ed Risk analyst [phone] Office  [phone]      

## 2018-08-19 NOTE — Progress Notes (Signed)
VASCULAR LAB PRELIMINARY  PRELIMINARY  PRELIMINARY  PRELIMINARY  Patient had CTA of neck.  Please advise if carotid duplex still needed.  Thank you,  Rael Tilly, RVT  08/19/2018, 7:12 AM  Pager:[phone]

## 2018-08-19 NOTE — Progress Notes (Addendum)
MRI brain completed. Images and report reviewed.  MRI brain: Acute infarction affecting the left basal ganglia. No hemorrhage or swelling in that region at this time. Subacute 1.6 cm hematoma in the left posterior temporal lobe with mild surrounding edema. This probably relates to a hemorrhagic infarction. Extensive chronic small-vessel ischemic changes throughout the brain. Old right MCA territory infarction.  MRA head: MR angiography shows numerous intracranial stenoses. Most notably, there is an 80% or greater stenosis of the right M1 segment. There are multiple occluded left M2 branches, with the residual visualized branches showing advanced atherosclerotic narrowing and irregularity. The exact age of the occlusions on the left is Unknown.  Comparison with the prior CT from yesterday reveals that the hemorrhage in the posterior left temporal lobe is new.   The MRI also reveals multiple chronic microhemorrhages within the brain parenchyma, appearing most consistent with either hypertensive hemorrhages or amyloid angiopathy.   A/R: 77 year old female with acute left MCA stroke due to left M2 occlusion in the setting of atrial fibrillation.  1. Has a high risk of recurrent stroke due to atrial fibrillation, but also with a high risk of expansion of the acute left temporal lobe hemorrhage, which is most likely a tPA-related hemorrhage. The risk of hematoma expansion over the next 24-48 hours is felt to be higher than the risk of stroke recurrence, but the risk of stroke will increase over that of hemorrhage as time passes given severe intracranial atherosclerotic disease and atrial fibrillation. Will hold off on starting ASA for now but it should most likely be started if repeat CT head in 48 hours shows that the left temporal lobe hematoma is stable.  2. CT head ordered for 9/16 at 8 PM. 3. Continue to monitor closely.   Electronically signed: Dr. Kerney Elbe

## 2018-08-19 NOTE — Evaluation (Signed)
Clinical/Bedside Swallow Evaluation Patient Details  Name: [name] MRN: [id number] Date of Birth: 18-Jun-1941  Today's Date: 08/19/2018 Time: SLP Start Time (ACUTE ONLY): 1002 SLP Stop Time (ACUTE ONLY): 1008 SLP Time Calculation (min) (ACUTE ONLY): 6 min  Past Medical History:  Past Medical History:  Diagnosis Date  . Acute embolism and thombos unsp deep vn unsp lower extremity ([place])   . Anemia   . Arthritis    osteoarthritis  . Asthma   . Back pain, chronic   . Chronic knee pain   . Cognitive communication deficit   . Dementia   . Diabetes mellitus   . Dysphagia   . Dysrhythmia    AFib  . Hemiplegia and hemiparesis following cerebral infarction affecting left non-dominant side ([place])   . HOH (hard of hearing)   . Hyperlipidemia   . Hypertension   . Obesity   . Paroxysmal A-fib ([place])   . Phonological disorder   . Phonological disorder   . PMB (postmenopausal bleeding) 11/26/2014  . Stroke [facility])    Mini Stroke; nleft sided hemiplegia  . Thickened endometrium 01/15/2015  . TIA (transient ischemic attack)   . Urge incontinence 11/26/2014  . Vertigo    Past Surgical History:  Past Surgical History:  Procedure Laterality Date  . APPENDECTOMY    . CATARACT EXTRACTION W/PHACO Left 01/16/2013   Procedure: CATARACT EXTRACTION PHACO AND INTRAOCULAR LENS PLACEMENT (IOC);  Surgeon: Elta Guadeloupe T. Gershon Crane, MD;  Location: [facility]S;  Service: Ophthalmology;  Laterality: Left;  CDE:14.37  . colonoscopy  2009   Dr. Wilford Corner: 4 hyperplastic polyps removed. Small internal hemorrhoids. Recommended 5 year follow-up colonoscopy.  . COLONOSCOPY N/A 02/24/2015   Procedure: COLONOSCOPY;  Surgeon: Danie Binder, MD;  Location: [facility];  Service: Endoscopy;  Laterality: N/A;  1030  . ENDOMETRIAL ABLATION    . ENDOMETRIAL BIOPSY  04/03/2013      . HYSTEROSCOPY W/D&C N/A 04/24/2013   Procedure: SUCTION DILATATION AND CURETTAGE /HYSTEROSCOPY;  Surgeon: Jonnie Kind, MD;  Location:  [facility] ORS;  Service: Gynecology;  Laterality: N/A;  . HYSTEROSCOPY W/D&C N/A 04/02/2015   Procedure: UTERINE CURETTAGE, HYSTEROSCOPY;  Surgeon: Florian Buff, MD;  Location: [facility] ORS;  Service: Gynecology;  Laterality: N/A;  . KNEE ARTHROSCOPY WITH MEDIAL MENISECTOMY Left 07/26/2013   Procedure: KNEE ARTHROSCOPY WITH PARTIAL MEDIAL MENISECTOMY;  Surgeon: Sanjuana Kava, MD;  Location: [facility] ORS;  Service: Orthopedics;  Laterality: Left;  . OVARY SURGERY     HPI:  [name] is an 77 y.o. female with a history of stroke in 2017 with residual left sided deficits who presented to the ED at [facility] after a possible seizure, nonverbal and new right sided weakness. Code stroke called. CT head revealed no acute finding; extensive chronic small-vessel ischemic changes throughout the brain, an old infarction in the right basal ganglia and at the right parietal cortical and subcortical. tPA was administered. MRI pending. CXR Low lung volumes with cardiomegaly and scattered atelectasis. No prior BSE documentation found.   Assessment / Plan / Recommendation Clinical Impression  Pt unable to perform volitional oral-motor movements due to suspected apraxia; appears to have slight right sided facial weakness at rest. One step commands were not followed with auditory or visual cues given. Pt was aphonic and attempts to elicit phonation were not successful. She may have residue in pharynx or oral cavity indicated by multiple swallows observed. Oral spill and holding with noted however pharyngeal swallow unremarkable for inadequate airway  protection via subjective measures. Instrumental evaluation scheduled for 11:00.     SLP Visit Diagnosis: Dysphagia, unspecified (R13.10)    Aspiration Risk  Moderate aspiration risk    Diet Recommendation NPO        Other  Recommendations Oral Care Recommendations: Oral care QID   Follow up Recommendations Skilled Nursing facility      Frequency and Duration             Prognosis        Swallow Study   General HPI: [name] is an 77 y.o. female with a history of stroke in 2017 with residual left sided deficits who presented to the ED at [facility] after a possible seizure, nonverbal and new right sided weakness. Code stroke called. CT head revealed no acute finding; extensive chronic small-vessel ischemic changes throughout the brain, an old infarction in the right basal ganglia and at the right parietal cortical and subcortical. tPA was administered. MRI pending. CXR Low lung volumes with cardiomegaly and scattered atelectasis. No prior BSE documentation found. Type of Study: Bedside Swallow Evaluation Previous Swallow Assessment: (none) Diet Prior to this Study: NPO Temperature Spikes Noted: No Respiratory Status: Nasal cannula History of Recent Intubation: No Behavior/Cognition: Alert;Cooperative;Requires cueing;Distractible Oral Cavity Assessment: Dry Oral Care Completed by SLP: Yes Vision: Functional for self-feeding Self-Feeding Abilities: Needs assist Patient Positioning: Upright in bed Baseline Vocal Quality: Other (comment)(aphonic during assessment ) Volitional Cough: Other (Comment)(unable, suspect apraxia) Volitional Swallow: Unable to elicit    Oral/Motor/Sensory Function Overall Oral Motor/Sensory Function: Other (comment)(right facial appears weak, unable to perform movements)   Ice Chips Ice chips: Not tested   Thin Liquid Thin Liquid: Impaired Presentation: Cup Oral Phase Impairments: Reduced labial seal Oral Phase Functional Implications: Left anterior spillage;Right anterior spillage;Oral holding Pharyngeal  Phase Impairments: Multiple swallows    Nectar Thick Nectar Thick Liquid: Not tested   Honey Thick Honey Thick Liquid: Not tested   Puree Puree: Impaired Oral Phase Impairments: Reduced lingual movement/coordination Oral Phase Functional Implications: Oral holding Pharyngeal Phase Impairments: Multiple swallows    Solid     Solid: Not tested      [name] 08/19/2018,11:10 AM   [name].Ed Risk analyst [phone] Office [phone]

## 2018-08-19 NOTE — Progress Notes (Signed)
STROKE TEAM PROGRESS NOTE   SUBJECTIVE (INTERVAL HISTORY) Her speech therapist is at the bedside.  Patient lying in bed, eyes open, global aphasia. Had MBS today and she passed swallow.   OBJECTIVE Vitals:   08/19/18 0600 08/19/18 0630 08/19/18 0700 08/19/18 0800  BP: 139/80 (!) 159/116 110/87 126/79  Pulse: 82 83 (!) 36 82  Resp: 14 (!) 25 14 20   Temp:    (!) 97.2 F (36.2 C)  TempSrc:    Axillary  SpO2: 100% 100% 100% 100%  Weight:        CBC:  Recent Labs  Lab 08/18/18 1715 08/18/18 1731  WBC 5.9  --   NEUTROABS 2.9  --   HGB 14.2 13.9  HCT 41.2 41.0  MCV 100.5*  --   PLT 213  --     Basic Metabolic Panel:  Recent Labs  Lab 08/18/18 1715 08/18/18 1731  NA 139 141  K 4.2 4.2  CL 108 108  CO2 25  --   GLUCOSE 96 90  BUN 19 20  CREATININE 1.26* 1.30*  CALCIUM 8.7*  --     Lipid Panel:     Component Value Date/Time   CHOL 198 08/19/2018 0300   TRIG 52 08/19/2018 0300   HDL 45 08/19/2018 0300   CHOLHDL 4.4 08/19/2018 0300   VLDL 10 08/19/2018 0300   LDLCALC 143 (H) 08/19/2018 0300   HgbA1c:  Lab Results  Component Value Date   HGBA1C 5.5 08/19/2018   Urine Drug Screen:     Component Value Date/Time   LABOPIA NONE DETECTED 05/11/2015 0106   COCAINSCRNUR NONE DETECTED 05/11/2015 0106   LABBENZ NONE DETECTED 05/11/2015 0106   AMPHETMU NONE DETECTED 05/11/2015 0106   THCU NONE DETECTED 05/11/2015 0106   LABBARB NONE DETECTED 05/11/2015 0106    Alcohol Level     Component Value Date/Time   ETH <10 08/18/2018 1715    IMAGING  Ct Angio Head W Or Wo Contrast Ct Angio Neck W Or Wo Contrast 08/18/2018 IMPRESSION:  1. Extensive severe intracranial atherosclerotic change as detailed above, progressed relative to previous MRA from 2016.  2. Severe near occlusive proximal left M2 stenosis, superior division. Additional severe M2 stenoses involving the inferior divisions bilaterally.  3. Short-segment severe mid right M1 stenosis.  4. Severe mid  right A2 stenosis with essentially occlusion of the right ACA distally. This is progressed from previous.  5. Short-segment atheromatous stenosis of approximately 60% at the proximal right ICA.  6. Widely patent vertebrobasilar system. Moderate atheromatous change within the P2 segments bilaterally.   Dg Chest Port 1 View 08/19/2018 IMPRESSION:  Low lung volumes with cardiomegaly and scattered atelectasis.    Ct Head Code Stroke Wo Contrast 08/18/2018 IMPRESSION:  No acute finding by CT. Extensive chronic small-vessel ischemic changes throughout the brain. Old infarction in the right basal ganglia and at the right parietal cortical and subcortical brain.    MRI / MRA Head WO Contrast - pending   Transthoracic Echocardiogram - pending 00/00/00    PHYSICAL EXAM  Temp:  [97.2 F (36.2 C)-98.2 F (36.8 C)] 98.2 F (36.8 C) (09/14 1600) Pulse Rate:  [36-126] 97 (09/14 1800) Resp:  [8-47] 20 (09/14 1800) BP: (110-176)/(56-116) 158/88 (09/14 1800) SpO2:  [96 %-100 %] 100 % (09/14 1800)  General - morbid obesity, well developed, in no apparent distress.  Ophthalmologic - fundi not visualized due to noncooperation.  Cardiovascular - irregularly irregular heart rate and rhythm  Neuro -awake, alert, eyes  open, able to track, however global aphasia no language output, not following commands.  No significant gaze preference, able to attend both sides, PERRL, blinking to visual threat bilaterally.  Right facial droop, tongue midline in mouth.  On pain stimulation bilateral upper extremity 3-/5.  Left lower extremity 3-/5, right lower extremity 1+/5.  PR 1+, no Babinski. Sensation, coordination and gait not tested.   ASSESSMENT/PLAN Ms. HIILANI JETTER is a 77 y.o. female with history of previous strokes with residual left-sided weakness, vertigo, atrial fibrillation without anticoagulation, obesity, anemia, hypertension, hyperlipidemia, hard of hearing, diabetes mellitus, dementia,  asthma, and history of DVT, presenting with possible seizure, eye deviation, aphasia, and right-sided weakness. IV tPA per tele neurology at [facility] - Fri 08/18/2018 at 1745  Stroke: Left MCA stroke due to left M2 occlusion, embolic pattern, likely due to A. fib not on anticoagulation.  Resultant right facial droop, right lower extremity weakness, global aphasia  CT head - no acute findings. Old infarcts rt basal ganglia and rt parietal cortical and subcortical brain.   CTA head and neck - left M2 inferior and superior branches near occlusion  MRI head - pending  MRA head - pending  2D Echo - pending  LDL - 143  HgbA1c - 5.5  VTE prophylaxis - SCDs  Diet - dysphagia 1 and thin liquid  No antithrombotic prior to admission, now on No antithrombotic within 24 hours of TPA  Patient will be counseled to be compliant with her antithrombotic medications  Ongoing aggressive stroke risk factor management  Therapy recommendations:  pending  Disposition:  Pending  Chronic A. Fib  Started on Eliquis in 05/2015 after stroke  Continued with adequate at least through 04/2016  However, Eliquis was not listed in her home medication on this admission  Not sure why she stopped Eliquis  History of stroke  07/2014 -stroke put on aspirin  05/2015, admitted left arm weakness, put on Eliquis.  She did have post menopausal vaginal bleeding, following with GYN, put on IUD.  Discussed with GYN, okay to start anticoagulation  04/2016, admitted for dysarthria and dysphasia at any pain, concerning for subcortical infarct, recommended add aspirin onto Eliquis for 1 months and then Eliquis alone.  ?? Seizure activity on presentation  No seizure activity since admission  EEG pending  Seizure precaution  Hypertension  Stable . SBP goal S/P TPA - <180/105 mmHg . Long-term BP goal normotensive  Hyperlipidemia  Lipid lowering medication PTA: none  LDL 143, goal < 70  Add Lipitor  40  Continue statin at discharge  Diabetes  HgbA1c 5.5, goal < 7.0  Controlled  SSI  CBG monitoring  Other Stroke Risk Factors  Advanced age  Former cigarette smoker - quit  Obesity, Body mass index is 46.72 kg/m., recommend weight loss, diet and exercise as appropriate   Family hx stroke (paternal grandmother)  Other Active Problems  Creatinine 1.30   Hospital day # 1  This patient is critically ill due to left MCA stroke status post TPA, left MCA occlusion, A. fib, history of stroke and at significant risk of neurological worsening, death form recurrent stroke, hemorrhagic conversion, heart failure, seizure. This patient's care requires constant monitoring of vital signs, hemodynamics, respiratory and cardiac monitoring, review of multiple databases, neurological assessment, discussion with family, other specialists and medical decision making of high complexity. I spent 40 minutes of neurocritical care time in the care of this patient.  Rosalin Hawking, MD PhD Stroke Neurology 08/19/2018 6:48 PM  To contact Stroke Continuity provider, please refer to [email or web address] After hours, contact General Neurology

## 2018-08-19 NOTE — Progress Notes (Signed)
Modified Barium Swallow Progress Note  Patient Details  Name: [name] MRN: [id number] Date of Birth: 12/14/1940  Today's Date: 08/19/2018  Modified Barium Swallow completed.  Full report located under Chart Review in the Imaging Section.  Brief recommendations include the following:  Clinical Impression  Pt's oral deficits related to bolus manipulation and transit evidenced by verbal and tactile cues with dry spoon needed to initiate propulsion of boluses intermittently. Timing and coordination of swallow adequate with mostly vertical epiglottic movement achieved at height of swallow. Tongue base contact with pharyngeal wall appeared mildy reduced attributing to consistent mild vallecular residue, mild pyriform sinus residue. Spontaneous swallows, when present, cleared stasis. Recommend she initiate puree (Dys 1) texture, thin liquids, straws allowed, crush pills, full supervision and assist. Ensure pt swallows prior to subsequent bites. ST wil continue to intervene for safety with diet and ability to upgrade.     Swallow Evaluation Recommendations       SLP Diet Recommendations: Dysphagia 1 (Puree) solids;Thin liquid   Liquid Administration via: Straw;Cup   Medication Administration: Crushed with puree   Supervision: Patient able to self feed;Staff to assist with self feeding;Full supervision/cueing for compensatory strategies   Compensations: Minimize environmental distractions;Small sips/bites;Slow rate;Lingual sweep for clearance of pocketing   Postural Changes: Seated upright at 90 degrees   Oral Care Recommendations: Oral care BID        [name] 08/19/2018,12:02 PM   [name].Ed Risk analyst [phone] Office [phone]

## 2018-08-19 NOTE — Progress Notes (Signed)
PT Cancellation Note  Patient Details Name: KHALESSI BLOUGH MRN: [id number] DOB: 1941/09/22   Cancelled Treatment:    Reason Eval/Treat Not Completed: Medical issues which prohibited therapy(Pt on strict bedrest, s/p tPa.  Please update activity level for Evaluation on Sunday. )   Kaesen Rodriguez F Easton Sivertson 08/19/2018, 8:18 AM  [place] Pager:  [phone]  Office:  [phone]

## 2018-08-20 ENCOUNTER — Inpatient Hospital Stay (HOSPITAL_COMMUNITY): Payer: Medicare HMO

## 2018-08-20 DIAGNOSIS — I351 Nonrheumatic aortic (valve) insufficiency: Secondary | ICD-10-CM

## 2018-08-20 LAB — GLUCOSE, CAPILLARY
Glucose-Capillary: 84 mg/dL (ref 70–99)
Glucose-Capillary: 79 mg/dL (ref 70–99)
Glucose-Capillary: 122 mg/dL — ABNORMAL HIGH (ref 70–99)
Glucose-Capillary: 98 mg/dL (ref 70–99)
Glucose-Capillary: 85 mg/dL (ref 70–99)

## 2018-08-20 LAB — ECHOCARDIOGRAM COMPLETE: Weight: 3827.2 oz

## 2018-08-20 NOTE — Progress Notes (Signed)
  Echocardiogram 2D Echocardiogram has been performed.  Jennette Dubin 08/20/2018, 2:47 PM

## 2018-08-20 NOTE — Evaluation (Addendum)
Physical Therapy Evaluation Patient Details Name: [name] MRN: [id number] DOB: [birth year] Today's Date: 08/20/2018   History of Present Illness  77 y.o. female with a history of stroke in 2017 with residual left sided deficits who presented to the ED at [facility] after a possible seizure, nonverbal and new right sided weakness. MRI showing Acute infarction affecting the left basal ganglia.  Clinical Impression   Pt admitted with above diagnosis. Pt currently with functional limitations due to the deficits listed below (see PT Problem List). Dependent for mobility and ADLs at SNF prior to admission; Daughter states pt had recently restarted rehab services (PT in particular) at SNF within the past month; Presents with decr cognition, slow processing, dependencies with functional mobility;  Pt will benefit from skilled PT to increase their independence and safety with mobility to allow discharge to the venue listed below. Noted that it is likely  That family will engage with Palliative Care team to help elucidate goals of care; If PT has a role in [name] Goals of Care, will continue in her care; It does look like she enjoyed being OOB this session; Recommend nursing help pt OOB to recliner daily with lift.     Follow Up Recommendations SNF    Equipment Recommendations  None recommended by PT    Recommendations for Other Services       Precautions / Restrictions Precautions Precautions: Fall Restrictions Weight Bearing Restrictions: No      Mobility  Bed Mobility Overal bed mobility: Needs Assistance Bed Mobility: Supine to Sit     Supine to sit: Mod assist;+2 for safety/equipment;+2 for physical assistance;Max assist     General bed mobility comments: Cues to initiate, good attention to task with volunatery reach of RUE across body to pull up; Max assist to square off hips at EOB using bed pad  Transfers Overall transfer level: Needs assistance Equipment used: 2  person hand held assist(and hips supported with pad) Transfers: Sit to/from Stand;Stand Pivot Transfers Sit to Stand: Max assist;+2 physical assistance Stand pivot transfers: Max assist;+2 physical assistance       General transfer comment: Max A for weight shift, power up into standing, and then pivot to recliner; noted pt tood a few small step getting to recliner  Ambulation/Gait                Stairs            Wheelchair Mobility    Modified Rankin (Stroke Patients Only)       Balance Overall balance assessment: Needs assistance Sitting-balance support: No upper extremity supported;Feet supported Sitting balance-Leahy Scale: Poor Sitting balance - Comments: Pt with posterior lean and required assistance to gain sitting balance and able to gain sitting balance with Min Guard A at EOB Postural control: Posterior lean;Right lateral lean Standing balance support: No upper extremity supported;During functional activity Standing balance-Leahy Scale: Zero Standing balance comment: Reliant on Max A                             Pertinent Vitals/Pain Pain Assessment: Faces Faces Pain Scale: Hurts a little bit Pain Intervention(s): Monitored during session    Home Living Family/patient expects to be discharged to:: Skilled nursing facility                 Additional Comments: [facility] in [place]    Prior Function Level of Independence: Needs assistance  Gait / Transfers Assistance Needed: non-ambulatory, and states that they use a mechanical lift to transfer her from the bed<>w/c.  ADL's / Homemaking Assistance Needed: Total A for ADLs. Feeding herself (was messy)  Comments: Family present to provide information     Hand Dominance   Dominant Hand: (left paresis prior stroke)    Extremity/Trunk Assessment   Upper Extremity Assessment Upper Extremity Assessment: Defer to OT evaluation RUE Deficits / Details: Pt able to  bring hand to nose and hand. Decreased FM coorindation and grasp strength. Decreased processing LUE Deficits / Details: Prior CVA and hand placed in lambs wool hand guard.     Lower Extremity Assessment Lower Extremity Assessment: Generalized weakness;RLE deficits/detail;LLE deficits/detail RLE Deficits / Details: Slow to initiate movement, and lots of tactile cueing; Voluntary muscle activation present RLE Coordination: decreased gross motor LLE Deficits / Details: Slow to initiate movement, and lots of tactile cueing; Voluntary muscle activation present, though less so than LLE LLE Coordination: decreased gross motor    Cervical / Trunk Assessment Cervical / Trunk Assessment: Kyphotic;Other exceptions Cervical / Trunk Exceptions: Increased body habitus  Communication   Communication: Expressive difficulties;Other (comment)(Slow processing and slow speech)  Cognition Arousal/Alertness: Awake/alert Behavior During Therapy: WFL for tasks assessed/performed Overall Cognitive Status: Impaired/Different from baseline Area of Impairment: Orientation;Attention;Following commands;Safety/judgement;Problem solving                 Orientation Level: Disoriented to;Place;Time;Situation Current Attention Level: Sustained(Easily distracted by conversation in the room)   Following Commands: Follows one step commands with increased time     Problem Solving: Slow processing;Difficulty sequencing;Requires verbal cues;Requires tactile cues General Comments: Intact social pragmatics; Seeks and sustains eye contact      General Comments General comments (skin integrity, edema, etc.): VSS. Family present    Exercises     08/20/18 1400  Modified Rankin (Stroke Patients Only)  Pre-Morbid Rankin Score 5  Modified Rankin 5      Assessment/Plan    PT Assessment Patient needs continued PT services  PT Problem List Decreased strength;Decreased range of motion;Decreased activity  tolerance;Decreased balance;Decreased mobility;Decreased coordination;Decreased cognition;Decreased knowledge of use of DME;Decreased safety awareness;Decreased knowledge of precautions;Obesity;Impaired sensation;Impaired tone       PT Treatment Interventions DME instruction;Functional mobility training;Therapeutic activities;Therapeutic exercise;Balance training;Neuromuscular re-education;Cognitive remediation;Patient/family education    PT Goals (Current goals can be found in the Care Plan section)  Acute Rehab PT Goals Patient Stated Goal: Did not state goal, but agreeable to getting up and out of bed PT Goal Formulation: With family Time For Goal Achievement: 09/03/18 Potential to Achieve Goals: Fair    Frequency Min 1X/week   Barriers to discharge        Co-evaluation PT/OT/SLP Co-Evaluation/Treatment: Yes Reason for Co-Treatment: For patient/therapist safety PT goals addressed during session: Mobility/safety with mobility OT goals addressed during session: ADL's and self-care       AM-PAC PT "6 Clicks" Daily Activity  Outcome Measure Difficulty turning over in bed (including adjusting bedclothes, sheets and blankets)?: Unable Difficulty moving from lying on back to sitting on the side of the bed? : Unable Difficulty sitting down on and standing up from a chair with arms (e.g., wheelchair, bedside commode, etc,.)?: Unable Help needed moving to and from a bed to chair (including a wheelchair)?: A Lot Help needed walking in hospital room?: Total Help needed climbing 3-5 steps with a railing? : Total 6 Click Score: 7    End of Session Equipment Utilized During Treatment: Gait belt(bed pad) Activity  Tolerance: Patient tolerated treatment well Patient left: in chair;with call bell/phone within reach;with chair alarm set Nurse Communication: Mobility status PT Visit Diagnosis: Other abnormalities of gait and mobility (R26.89);Muscle weakness (generalized) (M62.81);Other  symptoms and signs involving the nervous system (R29.898)    Time: 0289-0228 PT Time Calculation (min) (ACUTE ONLY): 32 min   Charges:   PT Evaluation $PT Eval Moderate Complexity: 1 Mod          Roney [place], [place]  Acute Rehabilitation Services Pager [phone] Office [phone]   [place] 08/20/2018, 12:59 PM

## 2018-08-20 NOTE — Evaluation (Signed)
Occupational Therapy Evaluation Patient Details Name: [name] MRN: [id number] DOB: April 04, 1941 Today's Date: 08/20/2018    History of Present Illness 77 y.o. female with a history of stroke in 2017 with residual left sided deficits who presented to the ED at [facility] after a possible seizure, nonverbal and new right sided weakness. MRI showing Acute infarction affecting the left basal ganglia.   Clinical Impression   PTA, pt was at [facility] and required assistance for bathing, dressing, toileting, and transfers; facility using lift for bed<>w/c. Pt currently requiring Max-Total A for ADLs and Max A +2 for stand pivot to recliner. Pt very pleasant and agreeable. Pt presenting with decreased functional use of LUE (from prior CVA), balance, strength, and cognition. Pt requiring increased time and cues throughout session. Pt presenting near baseline function and family discussing possible Hospice Care. Defer further OT to hospice or SNF with PT/OT rehab to optimize safety and independence with BADLs..       Follow Up Recommendations  SNF;Other (comment);Supervision/Assistance - 24 hour(Family discussing Hospice Care)    Equipment Recommendations  None recommended by OT    Recommendations for Other Services       Precautions / Restrictions Precautions Precautions: Fall Restrictions Weight Bearing Restrictions: No      Mobility Bed Mobility Overal bed mobility: Needs Assistance Bed Mobility: Supine to Sit     Supine to sit: Mod assist;+2 for safety/equipment;+2 for physical assistance;Max assist     General bed mobility comments: Cues to initiate, good attention to task with volunatery reach of RUE across body to pull up; Max assist to square off hips at EOB using bed pad  Transfers Overall transfer level: Needs assistance Equipment used: 2 person hand held assist(and hips supported with pad) Transfers: Sit to/from Stand;Stand Pivot Transfers Sit to Stand: Max  assist;+2 physical assistance Stand pivot transfers: Max assist;+2 physical assistance       General transfer comment: Max A for weight shift, power up into standing, and then pivot to recliner; noted pt tood a few small step getting to recliner    Balance Overall balance assessment: Needs assistance Sitting-balance support: No upper extremity supported;Feet supported Sitting balance-Leahy Scale: Poor Sitting balance - Comments: Pt with posterior lean and required assistance to gain sitting balance and able to gain sitting balance with Min Guard A at EOB Postural control: Posterior lean;Right lateral lean Standing balance support: No upper extremity supported;During functional activity Standing balance-Leahy Scale: Zero Standing balance comment: Reliant on Max A                           ADL either performed or assessed with clinical judgement   ADL Overall ADL's : Needs assistance/impaired                                       General ADL Comments: Pt requiring Max-Total A for ADLs due to decreased strength, balance, coorindation, and cognition     Vision         Perception     Praxis      Pertinent Vitals/Pain Pain Assessment: Faces Faces Pain Scale: Hurts a little bit Pain Intervention(s): Monitored during session;Limited activity within patient's tolerance;Repositioned     Hand Dominance (left paresis prior stroke)   Extremity/Trunk Assessment Upper Extremity Assessment Upper Extremity Assessment: RUE deficits/detail;LUE deficits/detail RUE Deficits / Details: Pt able  to bring hand to nose and hand. Decreased FM coorindation and grasp strength. Decreased processing RUE Coordination: decreased fine motor;decreased gross motor LUE Deficits / Details: Prior CVA and hand placed in lambs wool hand guard. Daughter reports pt uses lambs wool palm protector periodically.  LUE Coordination: decreased fine motor;decreased gross motor   Lower  Extremity Assessment Lower Extremity Assessment: Defer to PT evaluation   Cervical / Trunk Assessment Cervical / Trunk Assessment: Kyphotic;Other exceptions Cervical / Trunk Exceptions: Increased body habitus   Communication Communication Communication: Expressive difficulties;Other (comment)(Slow processing and slow speech)   Cognition Arousal/Alertness: Awake/alert Behavior During Therapy: WFL for tasks assessed/performed Overall Cognitive Status: Impaired/Different from baseline Area of Impairment: Orientation;Attention;Following commands;Safety/judgement;Problem solving                 Orientation Level: Disoriented to;Place;Time;Situation Current Attention Level: Sustained(Easily distracted by conversation in the room)   Following Commands: Follows one step commands with increased time Safety/Judgement: Decreased awareness of safety;Decreased awareness of deficits   Problem Solving: Slow processing;Difficulty sequencing;Requires verbal cues;Requires tactile cues General Comments: Intact social pragmatics; Seeks and sustains eye contact. inconsistant to answer questions and requires increased time   General Comments  VSS. Family present    Exercises     Shoulder Instructions      Home Living Family/patient expects to be discharged to:: Skilled nursing facility                                 Additional Comments: [facility] in [place]      Prior Functioning/Environment Level of Independence: Needs assistance  Gait / Transfers Assistance Needed: non-ambulatory, and states that they use a mechanical lift to transfer her from the bed<>w/c. ADL's / Homemaking Assistance Needed: Total A for ADLs. Feeding herself (was messy)   Comments: Family present to provide information        OT Problem List: Decreased strength;Decreased range of motion;Decreased activity tolerance;Impaired balance (sitting and/or standing);Decreased  coordination;Decreased cognition;Decreased safety awareness;Decreased knowledge of use of DME or AE;Decreased knowledge of precautions;Impaired UE functional use      OT Treatment/Interventions:      OT Goals(Current goals can be found in the care plan section) Acute Rehab OT Goals Patient Stated Goal: Did not state goal, but agreeable to getting up and out of bed OT Goal Formulation: All assessment and education complete, DC therapy  OT Frequency:     Barriers to D/C:            Co-evaluation PT/OT/SLP Co-Evaluation/Treatment: Yes Reason for Co-Treatment: For patient/therapist safety   OT goals addressed during session: ADL's and self-care      AM-PAC PT "6 Clicks" Daily Activity     Outcome Measure Help from another person eating meals?: A Lot Help from another person taking care of personal grooming?: A Lot Help from another person toileting, which includes using toliet, bedpan, or urinal?: Total Help from another person bathing (including washing, rinsing, drying)?: Total Help from another person to put on and taking off regular upper body clothing?: Total Help from another person to put on and taking off regular lower body clothing?: Total 6 Click Score: 8   End of Session Equipment Utilized During Treatment: Gait belt Nurse Communication: Mobility status;Precautions  Activity Tolerance: Patient tolerated treatment well Patient left: in chair;with call bell/phone within reach;with chair alarm set;with family/visitor present;with SCD's reapplied  OT Visit Diagnosis: Unsteadiness on feet (R26.81);Other abnormalities of  gait and mobility (R26.89);Muscle weakness (generalized) (M62.81);Other symptoms and signs involving cognitive function                Time: 7262-0355 OT Time Calculation (min): 26 min Charges:  OT General Charges $OT Visit: 1 Visit OT Evaluation $OT Eval Moderate Complexity: [place], OTR/L Acute Rehab Pager: [phone] Office:  [place] 08/20/2018, 4:29 PM

## 2018-08-20 NOTE — Progress Notes (Signed)
OT Cancellation Note  Patient Details Name: [name] MRN: [id number] DOB: April 04, 1941   Cancelled Treatment:    Reason Eval/Treat Not Completed: Active bedrest order. (Will return as schedule allows and pt medically ready. Thank you.)  [place], OTR/L Acute Rehab Pager: [phone] Office: [phone] 08/20/2018, 7:34 AM

## 2018-08-20 NOTE — Progress Notes (Addendum)
STROKE TEAM PROGRESS NOTE   SUBJECTIVE (INTERVAL HISTORY) Her speech is improved, can answer questions, baseline dementia. Stopped Eliquis due to vaginal bleeding. Discussed with daughter who may consider palliative care and getting hospice involved outpatient.  OBJECTIVE Vitals:   08/20/18 1200 08/20/18 1300 08/20/18 1400 08/20/18 1600  BP: (!) 162/101 (!) 165/111 (!) 153/108   Pulse: 77 66 72   Resp: 15 19 20    Temp: (!) 97.4 F (36.3 C)   98 F (36.7 C)  TempSrc: Axillary   Axillary  SpO2: 100% 99% 100%   Weight:        CBC:  Recent Labs  Lab 08/18/18 1715 08/18/18 1731  WBC 5.9  --   NEUTROABS 2.9  --   HGB 14.2 13.9  HCT 41.2 41.0  MCV 100.5*  --   PLT 213  --     Basic Metabolic Panel:  Recent Labs  Lab 08/18/18 1715 08/18/18 1731  NA 139 141  K 4.2 4.2  CL 108 108  CO2 25  --   GLUCOSE 96 90  BUN 19 20  CREATININE 1.26* 1.30*  CALCIUM 8.7*  --     Lipid Panel:     Component Value Date/Time   CHOL 198 08/19/2018 0300   TRIG 52 08/19/2018 0300   HDL 45 08/19/2018 0300   CHOLHDL 4.4 08/19/2018 0300   VLDL 10 08/19/2018 0300   LDLCALC 143 (H) 08/19/2018 0300   HgbA1c:  Lab Results  Component Value Date   HGBA1C 5.5 08/19/2018   Urine Drug Screen:     Component Value Date/Time   LABOPIA NONE DETECTED 05/11/2015 0106   COCAINSCRNUR NONE DETECTED 05/11/2015 0106   LABBENZ NONE DETECTED 05/11/2015 0106   AMPHETMU NONE DETECTED 05/11/2015 0106   THCU NONE DETECTED 05/11/2015 0106   LABBARB NONE DETECTED 05/11/2015 0106    Alcohol Level     Component Value Date/Time   ETH <10 08/18/2018 1715    IMAGING  Ct Angio Head W Or Wo Contrast Ct Angio Neck W Or Wo Contrast 08/18/2018 IMPRESSION:  1. Extensive severe intracranial atherosclerotic change as detailed above, progressed relative to previous MRA from 2016.  2. Severe near occlusive proximal left M2 stenosis, superior division. Additional severe M2 stenoses involving the inferior  divisions bilaterally.  3. Short-segment severe mid right M1 stenosis.  4. Severe mid right A2 stenosis with essentially occlusion of the right ACA distally. This is progressed from previous.  5. Short-segment atheromatous stenosis of approximately 60% at the proximal right ICA.  6. Widely patent vertebrobasilar system. Moderate atheromatous change within the P2 segments bilaterally.   Dg Chest Port 1 View 08/19/2018 IMPRESSION:  Low lung volumes with cardiomegaly and scattered atelectasis.    Ct Head Code Stroke Wo Contrast 08/18/2018 IMPRESSION:  No acute finding by CT. Extensive chronic small-vessel ischemic changes throughout the brain. Old infarction in the right basal ganglia and at the right parietal cortical and subcortical brain.    MRI / MRA Head WO Contrast  08/19/2018 IMPRESSION: Acute infarction affecting the left basal ganglia. No hemorrhage or swelling in that region at this time. Subacute 1.6 cm hematoma in the left posterior temporal lobe with mild surrounding edema. This probably relates to a hemorrhagic infarction. Extensive chronic small-vessel ischemic changes throughout the brain. Old right MCA territory infarction. MR angiography shows numerous intracranial stenoses. Most notably, there is an 80% or greater stenosis of the right M1 segment. There are multiple occluded left M2 branches, with the residual  visualized branches showing advanced atherosclerotic narrowing and irregularity. The exact age of the occlusions on the left is unknown.   Transthoracic Echocardiogram  08/20/2018 Study Conclusions - Left ventricle: The cavity size was normal. Wall thickness was   increased in a pattern of mild LVH. Systolic function was normal.   The estimated ejection fraction was in the range of 50% to 55%.   Wall motion was normal; there were no regional wall motion   abnormalities. - Aortic valve: Mildly calcified annulus. Trileaflet. There was   mild regurgitation. -  Mitral valve: There was trivial regurgitation. - Left atrium: The atrium was moderately dilated. - Right ventricle: Systolic function was mildly reduced. - Right atrium: Central venous pressure (est): 3 mm Hg. - Atrial septum: No defect or patent foramen ovale was identified. - Tricuspid valve: There was physiologic regurgitation. - Pulmonary arteries: PA peak pressure: 26 mm Hg (S). - Pericardium, extracardiac: There was no pericardial effusion.   EEG - pending    PHYSICAL EXAM  Temp:  [97.4 F (36.3 C)-98.4 F (36.9 C)] 98 F (36.7 C) (09/15 1600) Pulse Rate:  [40-105] 72 (09/15 1400) Resp:  [6-32] 20 (09/15 1400) BP: (125-169)/(75-125) 153/108 (09/15 1400) SpO2:  [97 %-100 %] 100 % (09/15 1400)  General - morbid obesity, well developed, in no apparent distress.  Ophthalmologic - fundi not visualized due to noncooperation.  Cardiovascular - irregularly irregular heart rate and rhythm  Neuro -awake, alert, eyes open, able to track, follows simple commands, can answer simple questions.  No significant gaze preference, able to attend both sides, PERRL, blinking to visual threat bilaterally.  Right facial droop, tongue midline in mouth.  On pain stimulation bilateral upper extremity 3-/5.  Left lower extremity 3-/5, right lower extremity 1+/5.  PR 1+, no Babinski. Sensation, coordination and gait not tested.   ASSESSMENT/PLAN Ms. CLOVIA REINE is a 77 y.o. female with history of previous strokes with residual left-sided weakness, vertigo, atrial fibrillation without anticoagulation, obesity, anemia, hypertension, hyperlipidemia, hard of hearing, diabetes mellitus, dementia, asthma, and history of DVT, presenting with possible seizure, eye deviation, aphasia, and right-sided weakness. IV tPA per tele neurology at [facility] - Fri 08/18/2018 at 1745  Stroke: Left MCA stroke due to left M2 occlusion, embolic pattern, likely due to A. fib not on anticoagulation.  Resultant right facial  droop, right lower extremity weakness, global aphasia  CT head - no acute findings. Old infarcts rt basal ganglia and rt parietal cortical and subcortical brain.   CTA head and neck - left M2 inferior and superior branches near occlusion  MRI head - Acute infarction affecting the left basal ganglia. Subacute 1.6 cm hematoma in the left posterior temporal lobe with mild surrounding edema. (See Dr Yvetta Coder note from yesterday below)  MRA head - numerous intracranial stenoses - 80% or greater stenosis of the right M1 segment - multiple occluded left M2 branches.  UDS - pending  2D Echo  - EF 50 - 55%. No cardiac source of emboli identified.   LDL - 143  EEG - pending  HgbA1c - 5.5  VTE prophylaxis - SCDs  Diet - dysphagia 1 and thin liquid  No antithrombotic prior to admission, now on No antithrombotic within 24 hours of TPA -> hematoma. Cannot be on Eliquis due to vaginal bleeding.  Patient will be counseled to be compliant with her antithrombotic medications  Ongoing aggressive stroke risk factor management  Therapy recommendations:  pending  Disposition:  Pending  Chronic A. Fib  Started on Eliquis in 05/2015 after stroke  Continued with adequate at least through 04/2016  However, Eliquis was not listed in her home medication on this admission  Not sure why she stopped Eliquis  History of stroke  07/2014 -stroke put on aspirin  05/2015, admitted left arm weakness, put on Eliquis.  She did have post menopausal vaginal bleeding, following with GYN, put on IUD.  Discussed with GYN, okay to start anticoagulation  04/2016, admitted for dysarthria and dysphasia at any pain, concerning for subcortical infarct, recommended add aspirin onto Eliquis for 1 months and then Eliquis alone.  Stopped eliquis due to vaginal bleeding.  ?? Seizure activity on presentation  No seizure activity since admission  Seizure precaution  Hypertension  Stable . SBP goal S/P TPA -  <180/105 mmHg . Long-term BP goal normotensive  Hyperlipidemia  Lipid lowering medication PTA: none  LDL 143, goal < 70  Add Lipitor 40  Continue statin at discharge  Diabetes  HgbA1c 5.5, goal < 7.0  Controlled  SSI  CBG monitoring  Other Stroke Risk Factors  Advanced age  Former cigarette smoker - quit  Obesity, Body mass index is 46.72 kg/m., recommend weight loss, diet and exercise as appropriate   Family hx stroke (paternal grandmother)  Other Active Problems  Creatinine 1.30 - check labs in AM   Hospital day # 2  Personally examined patient and images, and have participated in and made any corrections needed to history, physical, neuro exam,assessment and plan as stated above.  I have personally obtained the history, evaluated lab date, reviewed imaging studies and agree with radiology interpretations.    Sarina Ill, MD Stroke Neurology   To contact Stroke Continuity provider, please refer to [email or web address] After hours, contact General Neurology

## 2018-08-21 ENCOUNTER — Inpatient Hospital Stay (HOSPITAL_COMMUNITY): Payer: Medicare HMO

## 2018-08-21 LAB — GLUCOSE, CAPILLARY
Glucose-Capillary: 104 mg/dL — ABNORMAL HIGH (ref 70–99)
Glucose-Capillary: 115 mg/dL — ABNORMAL HIGH (ref 70–99)
Glucose-Capillary: 77 mg/dL (ref 70–99)
Glucose-Capillary: 80 mg/dL (ref 70–99)
Glucose-Capillary: 80 mg/dL (ref 70–99)
Glucose-Capillary: 91 mg/dL (ref 70–99)
Glucose-Capillary: 98 mg/dL (ref 70–99)
Glucose-Capillary: 99 mg/dL (ref 70–99)

## 2018-08-21 LAB — BASIC METABOLIC PANEL
Anion gap: 6 (ref 5–15)
BUN: 9 mg/dL (ref 8–23)
CO2: 23 mmol/L (ref 22–32)
Calcium: 8.3 mg/dL — ABNORMAL LOW (ref 8.9–10.3)
Chloride: 112 mmol/L — ABNORMAL HIGH (ref 98–111)
Creatinine, Ser: 0.79 mg/dL (ref 0.44–1.00)
GFR calc Af Amer: >60 mL/min (ref >60)
GFR calc non Af Amer: >60 mL/min (ref >60)
Glucose, Bld: 97 mg/dL (ref 70–99)
Potassium: 3.8 mmol/L (ref 3.5–5.1)
Sodium: 141 mmol/L (ref 135–145)

## 2018-08-21 LAB — CBC
HCT: 39.5 % (ref 36.0–46.0)
Hemoglobin: 13.1 g/dL (ref 12.0–15.0)
MCH: 33.4 pg (ref 26.0–34.0)
MCHC: 33.2 g/dL (ref 30.0–36.0)
MCV: 100.8 fL — ABNORMAL HIGH (ref 78.0–100.0)
Platelets: 180 10*3/uL (ref 150–400)
RBC: 3.92 MIL/uL (ref 3.87–5.11)
RDW: 12.3 % (ref 11.5–15.5)
WBC: 4.7 10*3/uL (ref 4.0–10.5)

## 2018-08-21 NOTE — Consult Note (Signed)
Reason for Consult: Atrial fibrillation Referring Physician: Neurology  [name] is an 77 y.o. female.  HPI:   History obtained from chart review and input from RN  77 year old female with hypertension, hyperlipidemia, type II diabetes mellitus, history of prior TIA and CVA, paroxysmal atrial fibrillation, admitted to [facility] on 08/18/2018 new onset right-sided weakness, and diagnosed to have acute stroke.  Cardiology consulted for management of atrial fibrillation.  Patient has had Afib before, but not consistently on anticoagulation. CT head showed acute infarction in left basal ganglia without hemorrhage or significant swelling/mass effect. Hemorrhagic infarction seen in left posterior temporal lobe with unchanges hematoma measuring 1.7 cm.   EKG and telemetry reviewed. Rate controlled on diltiazem 30 mg bid. Patient is currently not on anticoagulation due to hemorrhagic infarct.   Past Medical History:  Diagnosis Date  . Acute embolism and thombos unsp deep vn unsp lower extremity ([place])   . Anemia   . Arthritis    osteoarthritis  . Asthma   . Back pain, chronic   . Chronic knee pain   . Cognitive communication deficit   . Dementia   . Diabetes mellitus   . Dysphagia   . Dysrhythmia    AFib  . Hemiplegia and hemiparesis following cerebral infarction affecting left non-dominant side ([place])   . HOH (hard of hearing)   . Hyperlipidemia   . Hypertension   . Obesity   . Paroxysmal A-fib ([place])   . Phonological disorder   . Phonological disorder   . PMB (postmenopausal bleeding) 11/26/2014  . Stroke [facility])    Mini Stroke; nleft sided hemiplegia  . Thickened endometrium 01/15/2015  . TIA (transient ischemic attack)   . Urge incontinence 11/26/2014  . Vertigo     Past Surgical History:  Procedure Laterality Date  . APPENDECTOMY    . CATARACT EXTRACTION W/PHACO Left 01/16/2013   Procedure: CATARACT EXTRACTION PHACO AND INTRAOCULAR LENS PLACEMENT (IOC);   Surgeon: Elta Guadeloupe T. Gershon Crane, MD;  Location: [facility] ORS;  Service: Ophthalmology;  Laterality: Left;  CDE:14.37  . colonoscopy  2009   Dr. Wilford Corner: 4 hyperplastic polyps removed. Small internal hemorrhoids. Recommended 5 year follow-up colonoscopy.  . COLONOSCOPY N/A 02/24/2015   Procedure: COLONOSCOPY;  Surgeon: Danie Binder, MD;  Location: [facility] ENDO SUITE;  Service: Endoscopy;  Laterality: N/A;  1030  . ENDOMETRIAL ABLATION    . ENDOMETRIAL BIOPSY  04/03/2013      . HYSTEROSCOPY W/D&C N/A 04/24/2013   Procedure: SUCTION DILATATION AND CURETTAGE /HYSTEROSCOPY;  Surgeon: Jonnie Kind, MD;  Location: [facility] ORS;  Service: Gynecology;  Laterality: N/A;  . HYSTEROSCOPY W/D&C N/A 04/02/2015   Procedure: UTERINE CURETTAGE, HYSTEROSCOPY;  Surgeon: Florian Buff, MD;  Location: [facility] ORS;  Service: Gynecology;  Laterality: N/A;  . KNEE ARTHROSCOPY WITH MEDIAL MENISECTOMY Left 07/26/2013   Procedure: KNEE ARTHROSCOPY WITH PARTIAL MEDIAL MENISECTOMY;  Surgeon: Sanjuana Kava, MD;  Location: [facility] ORS;  Service: Orthopedics;  Laterality: Left;  . OVARY SURGERY      Family History  Problem Relation Age of Onset  . Arrhythmia Father        Atrial fib/Pacemaker  . Heart failure Mother   . Diabetes Mother   . Other Mother        fell and broke hip  . Diabetes Sister   . Lupus Daughter   . Mental illness Son   . ADD / ADHD Son   . Other Son        soft  bones  . Diabetes Maternal Grandmother   . Stroke Paternal Grandmother   . Arthritis Paternal Grandfather   . Other Sister        MVA  . Hypertension Brother   . Diabetes Sister   . Hypertension Sister   . Colon cancer Neg Hx     Social History:  reports that she quit smoking about 3 years ago. Her smoking use included cigarettes. She has a 7.50 pack-year smoking history. She has never used smokeless tobacco. She reports that she does not drink alcohol or use drugs.  Allergies:  Allergies  Allergen Reactions  . Imitrex [Sumatriptan] Other (See  Comments)    Unknown Reaction   . Mango Flavor Other (See Comments)    Nausea and vomiting  . Oysters [Shellfish Allergy] Other (See Comments)    Nausea and vomiting    Medications: I have reviewed the patient's current medications.  Results for orders placed or performed during the hospital encounter of 08/18/18 (from the past 48 hour(s))  Glucose, capillary     Status: None   Collection Time: 08/19/18 11:46 AM  Result Value Ref Range   Glucose-Capillary 75 70 - 99 mg/dL   Comment 1 Notify RN    Comment 2 Document in Chart   Glucose, capillary     Status: Abnormal   Collection Time: 08/19/18  3:07 PM  Result Value Ref Range   Glucose-Capillary 104 (H) 70 - 99 mg/dL   Comment 1 QC Due    Comment 2 Notify RN    Comment 3 Document in Chart   Glucose, capillary     Status: None   Collection Time: 08/19/18  8:07 PM  Result Value Ref Range   Glucose-Capillary 81 70 - 99 mg/dL  Glucose, capillary     Status: None   Collection Time: 08/19/18 11:45 PM  Result Value Ref Range   Glucose-Capillary 96 70 - 99 mg/dL  Glucose, capillary     Status: None   Collection Time: 08/20/18  3:28 AM  Result Value Ref Range   Glucose-Capillary 84 70 - 99 mg/dL  Glucose, capillary     Status: None   Collection Time: 08/20/18  7:54 AM  Result Value Ref Range   Glucose-Capillary 79 70 - 99 mg/dL  Glucose, capillary     Status: Abnormal   Collection Time: 08/20/18 11:34 AM  Result Value Ref Range   Glucose-Capillary 122 (H) 70 - 99 mg/dL  Glucose, capillary     Status: None   Collection Time: 08/20/18  3:51 PM  Result Value Ref Range   Glucose-Capillary 98 70 - 99 mg/dL  Glucose, capillary     Status: None   Collection Time: 08/20/18  7:55 PM  Result Value Ref Range   Glucose-Capillary 85 70 - 99 mg/dL   Comment 1 Notify RN    Comment 2 Document in Chart   Glucose, capillary     Status: None   Collection Time: 08/21/18 12:06 AM  Result Value Ref Range   Glucose-Capillary 99 70 - 99 mg/dL    Comment 1 Notify RN    Comment 2 Document in Chart   Basic metabolic panel     Status: Abnormal   Collection Time: 08/21/18  2:26 AM  Result Value Ref Range   Sodium 141 135 - 145 mmol/L   Potassium 3.8 3.5 - 5.1 mmol/L   Chloride 112 (H) 98 - 111 mmol/L   CO2 23 22 - 32 mmol/L   Glucose, Bld  97 70 - 99 mg/dL   BUN 9 8 - 23 mg/dL   Creatinine, Ser 0.79 0.44 - 1.00 mg/dL   Calcium 8.3 (L) 8.9 - 10.3 mg/dL   GFR calc non Af Amer >60 >60 mL/min   GFR calc Af Amer >60 >60 mL/min    Comment: (NOTE) The eGFR has been calculated using the CKD EPI equation. This calculation has not been validated in all clinical situations. eGFR's persistently <60 mL/min signify possible Chronic Kidney Disease.    Anion gap 6 5 - 15    Comment: Performed at [facility] [address] [place], [place] [address]  CBC     Status: Abnormal   Collection Time: 08/21/18  2:26 AM  Result Value Ref Range   WBC 4.7 4.0 - 10.5 K/uL   RBC 3.92 3.87 - 5.11 MIL/uL   Hemoglobin 13.1 12.0 - 15.0 g/dL   HCT 39.5 36.0 - 46.0 %   MCV 100.8 (H) 78.0 - 100.0 fL   MCH 33.4 26.0 - 34.0 pg   MCHC 33.2 30.0 - 36.0 g/dL   RDW 12.3 11.5 - 15.5 %   Platelets 180 150 - 400 K/uL    Comment: Performed at [facility], [place] [address], [place], [place] [address]  Glucose, capillary     Status: None   Collection Time: 08/21/18  3:41 AM  Result Value Ref Range   Glucose-Capillary 80 70 - 99 mg/dL   Comment 1 Notify RN    Comment 2 Document in Chart   Glucose, capillary     Status: None   Collection Time: 08/21/18  7:55 AM  Result Value Ref Range   Glucose-Capillary 91 70 - 99 mg/dL   Comment 1 Notify RN    Comment 2 Document in Chart     Mr Brain Wo Contrast  Result Date: 08/19/2018 CLINICAL DATA:  Previous stroke 2017.  Residual left-sided deficits. EXAM: MRI HEAD WITHOUT CONTRAST MRA HEAD WITHOUT CONTRAST TECHNIQUE: Multiplanar, multiecho pulse sequences of the brain and surrounding structures were  obtained without intravenous contrast. Angiographic images of the head were obtained using MRA technique without contrast. COMPARISON:  CT studies done yesterday.  MRI 05/10/2015. FINDINGS: MRI HEAD FINDINGS Brain: Diffusion imaging shows acute infarction affecting the basal ganglia on the left. Mild swelling but no hemorrhage. There is a subacute hemorrhage in the left posterior temporal lobe measuring 1.6 cm in diameter. There is mild surrounding edema. Elsewhere, there is generalized atrophy with extensive chronic small-vessel ischemic changes throughout the hemispheric white matter. There is old infarction in the right basal ganglia and right frontoparietal vertex region. Chronic small-vessel ischemic changes affect the pons and the thalami. No evidence of mass lesion, hydrocephalus or extra-axial collection. Vascular: Major vessels at the base of the brain show flow. See below. Skull and upper cervical spine: Negative Sinuses/Orbits: Clear/normal. Bilateral mastoid effusions are present. Other: None MRA HEAD FINDINGS Both internal carotid arteries are widely patent through the skull base and siphon regions. The anterior and middle cerebral vessels are patent proximally. On the right, there is severe stenosis in the M1 segment, 80% or greater. Right M2 and M3 branches show advanced atherosclerotic change with multiple stenoses. Left MCA shows patency of the M1 segment which is diffusely narrowed. There are M2 branch occlusions on the left of indeterminate age. Visualized branches are severely stenotic. Both anterior cerebral arteries are patent proximally. There is occlusion of the right anterior cerebral artery at the A2 level. Both  vertebral arteries are patent to the basilar. No basilar stenosis. Posterior circulation branch vessels are patent. Atherosclerotic irregularity is seen in the more distal branch vessels, particularly within the PCA branches. IMPRESSION: Acute infarction affecting the left basal  ganglia. No hemorrhage or swelling in that region at this time. Subacute 1.6 cm hematoma in the left posterior temporal lobe with mild surrounding edema. This probably relates to a hemorrhagic infarction. Extensive chronic small-vessel ischemic changes throughout the brain. Old right MCA territory infarction. MR angiography shows numerous intracranial stenoses. Most notably, there is an 80% or greater stenosis of the right M1 segment. There are multiple occluded left M2 branches, with the residual visualized branches showing advanced atherosclerotic narrowing and irregularity. The exact age of the occlusions on the left is unknown. Electronically Signed   By: Nelson Chimes M.D.   On: 08/19/2018 18:54   Dg Swallowing Func-speech Pathology  Result Date: 08/19/2018 Objective Swallowing Evaluation: Type of Study: MBS-Modified Barium Swallow Study  Patient Details Name: [name] MRN: [id number] Date of Birth: February 10, 1941 Today's Date: 08/19/2018 Time: SLP Start Time (ACUTE ONLY): 1039 -SLP Stop Time (ACUTE ONLY): 1054 SLP Time Calculation (min) (ACUTE ONLY): 15 min Past Medical History: Past Medical History: Diagnosis Date . Acute embolism and thombos unsp deep vn unsp lower extremity ([place])  . Anemia  . Arthritis   osteoarthritis . Asthma  . Back pain, chronic  . Chronic knee pain  . Cognitive communication deficit  . Dementia  . Diabetes mellitus  . Dysphagia  . Dysrhythmia   AFib . Hemiplegia and hemiparesis following cerebral infarction affecting left non-dominant side ([place])  . HOH (hard of hearing)  . Hyperlipidemia  . Hypertension  . Obesity  . Paroxysmal A-fib ([place])  . Phonological disorder  . Phonological disorder  . PMB (postmenopausal bleeding) 11/26/2014 . Stroke [facility])   Mini Stroke; nleft sided hemiplegia . Thickened endometrium 01/15/2015 . TIA (transient ischemic attack)  . Urge incontinence 11/26/2014 . Vertigo  Past Surgical History: Past Surgical History: Procedure Laterality Date . APPENDECTOMY   .  CATARACT EXTRACTION W/PHACO Left 01/16/2013  Procedure: CATARACT EXTRACTION PHACO AND INTRAOCULAR LENS PLACEMENT (IOC);  Surgeon: Elta Guadeloupe T. Gershon Crane, MD;  Location: [facility] ORS;  Service: Ophthalmology;  Laterality: Left;  CDE:14.37 . colonoscopy  2009  Dr. Wilford Corner: 4 hyperplastic polyps removed. Small internal hemorrhoids. Recommended 5 year follow-up colonoscopy. . COLONOSCOPY N/A 02/24/2015  Procedure: COLONOSCOPY;  Surgeon: Danie Binder, MD;  Location: [facility] ENDO SUITE;  Service: Endoscopy;  Laterality: N/A;  1030 . ENDOMETRIAL ABLATION   . ENDOMETRIAL BIOPSY  04/03/2013    . HYSTEROSCOPY W/D&C N/A 04/24/2013  Procedure: SUCTION DILATATION AND CURETTAGE /HYSTEROSCOPY;  Surgeon: Jonnie Kind, MD;  Location: [facility] ORS;  Service: Gynecology;  Laterality: N/A; . HYSTEROSCOPY W/D&C N/A 04/02/2015  Procedure: UTERINE CURETTAGE, HYSTEROSCOPY;  Surgeon: Florian Buff, MD;  Location: [facility] OR[facility];  Service: Gynecology;  Laterality: N/A; . KNEE ARTHROSCOPY WITH MEDIAL MENISECTOMY Left 07/26/2013  Procedure: KNEE ARTHROSCOPY WITH PARTIAL MEDIAL MENISECTOMY;  Surgeon: Sanjuana Kava, MD;  Location: [facility] ORS;  Service: Orthopedics;  Laterality: Left; . OVARY SURGERY   HPI: [name] is an 77 y.o. female with a history of stroke in 2017 with residual left sided deficits who presented to the ED at [facility] after a possible seizure, nonverbal and new right sided weakness. Code stroke called. CT head revealed no acute finding; extensive chronic small-vessel ischemic changes throughout the brain, an old infarction in the right basal ganglia  and at the right parietal cortical and subcortical. tPA was administered. MRI pending. CXR Low lung volumes with cardiomegaly and scattered atelectasis. No prior BSE documentation found.  No data recorded Assessment / Plan / Recommendation CHL IP CLINICAL IMPRESSIONS 08/19/2018 Clinical Impression Pt's oral deficits related to bolus manipulation and transit evidenced by verbal and tactile cues with  dry spoon needed to initiate propulsion of boluses intermittently. Timing and coordination of swallow adequate with mostly vertical epiglottic movement achieved at height of swallow. Tongue base contact with pharyngeal wall appeared mildy reduced attributing to consistent mild vallecular residue, mild pyriform sinus residue. Spontaneous swallows, when present, cleared stasis. Recommend she initiate puree (Dys 1) texture, thin liquids, straws allowed, crush pills, full supervision and assist. Ensure pt swallows prior to subsequent bites. ST wil continue to intervene for safety with diet and ability to upgrade.   SLP Visit Diagnosis Dysphagia, oropharyngeal phase (R13.12) Attention and concentration deficit following -- Frontal lobe and executive function deficit following -- Impact on safety and function Mild aspiration risk;Moderate aspiration risk   CHL IP TREATMENT RECOMMENDATION 08/19/2018 Treatment Recommendations Therapy as outlined in treatment plan below   Prognosis 08/19/2018 Prognosis for Safe Diet Advancement Fair Barriers to Reach Goals Cognitive deficits Barriers/Prognosis Comment -- CHL IP DIET RECOMMENDATION 08/19/2018 SLP Diet Recommendations Dysphagia 1 (Puree) solids;Thin liquid Liquid Administration via Straw;Cup Medication Administration Crushed with puree Compensations Minimize environmental distractions;Small sips/bites;Slow rate;Lingual sweep for clearance of pocketing Postural Changes Seated upright at 90 degrees   CHL IP OTHER RECOMMENDATIONS 08/19/2018 Recommended Consults -- Oral Care Recommendations Oral care BID Other Recommendations --   CHL IP FOLLOW UP RECOMMENDATIONS 08/19/2018 Follow up Recommendations Skilled Nursing facility   [facility] IP FREQUENCY AND DURATION 08/19/2018 Speech Therapy Frequency (ACUTE ONLY) min 2x/week Treatment Duration 2 weeks      CHL IP ORAL PHASE 08/19/2018 Oral Phase Impaired Oral - Pudding Teaspoon -- Oral - Pudding Cup -- Oral - Honey Teaspoon -- Oral - Honey Cup --  Oral - Nectar Teaspoon -- Oral - Nectar Cup -- Oral - Nectar Straw -- Oral - Thin Teaspoon -- Oral - Thin Cup Holding of bolus Oral - Thin Straw Holding of bolus Oral - Puree Holding of bolus Oral - Mech Soft -- Oral - Regular Delayed oral transit Oral - Multi-Consistency -- Oral - Pill -- Oral Phase - Comment --  CHL IP PHARYNGEAL PHASE 08/19/2018 Pharyngeal Phase Impaired Pharyngeal- Pudding Teaspoon -- Pharyngeal -- Pharyngeal- Pudding Cup -- Pharyngeal -- Pharyngeal- Honey Teaspoon -- Pharyngeal -- Pharyngeal- Honey Cup -- Pharyngeal -- Pharyngeal- Nectar Teaspoon -- Pharyngeal -- Pharyngeal- Nectar Cup -- Pharyngeal -- Pharyngeal- Nectar Straw -- Pharyngeal -- Pharyngeal- Thin Teaspoon -- Pharyngeal -- Pharyngeal- Thin Cup Pharyngeal residue - valleculae;Pharyngeal residue - pyriform Pharyngeal -- Pharyngeal- Thin Straw Pharyngeal residue - valleculae;Pharyngeal residue - pyriform Pharyngeal -- Pharyngeal- Puree Pharyngeal residue - valleculae Pharyngeal -- Pharyngeal- Mechanical Soft -- Pharyngeal -- Pharyngeal- Regular Pharyngeal residue - valleculae Pharyngeal -- Pharyngeal- Multi-consistency -- Pharyngeal -- Pharyngeal- Pill -- Pharyngeal -- Pharyngeal Comment --  CHL IP CERVICAL ESOPHAGEAL PHASE 08/19/2018 Cervical Esophageal Phase WFL Pudding Teaspoon -- Pudding Cup -- Honey Teaspoon -- Honey Cup -- Nectar Teaspoon -- Nectar Cup -- Nectar Straw -- Thin Teaspoon -- Thin Cup -- Thin Straw -- Puree -- Mechanical Soft -- Regular -- Multi-consistency -- Pill -- Cervical Esophageal Comment -- [name] 08/19/2018, 12:01 PM Orbie Pyo Litaker M.Ed Risk analyst [phone] Office [phone]  Mr Jodene Nam Head Wo Contrast  Result Date: 08/19/2018 CLINICAL DATA:  Previous stroke 2017.  Residual left-sided deficits. EXAM: MRI HEAD WITHOUT CONTRAST MRA HEAD WITHOUT CONTRAST TECHNIQUE: Multiplanar, multiecho pulse sequences of the brain and surrounding structures were  obtained without intravenous contrast. Angiographic images of the head were obtained using MRA technique without contrast. COMPARISON:  CT studies done yesterday.  MRI 05/10/2015. FINDINGS: MRI HEAD FINDINGS Brain: Diffusion imaging shows acute infarction affecting the basal ganglia on the left. Mild swelling but no hemorrhage. There is a subacute hemorrhage in the left posterior temporal lobe measuring 1.6 cm in diameter. There is mild surrounding edema. Elsewhere, there is generalized atrophy with extensive chronic small-vessel ischemic changes throughout the hemispheric white matter. There is old infarction in the right basal ganglia and right frontoparietal vertex region. Chronic small-vessel ischemic changes affect the pons and the thalami. No evidence of mass lesion, hydrocephalus or extra-axial collection. Vascular: Major vessels at the base of the brain show flow. See below. Skull and upper cervical spine: Negative Sinuses/Orbits: Clear/normal. Bilateral mastoid effusions are present. Other: None MRA HEAD FINDINGS Both internal carotid arteries are widely patent through the skull base and siphon regions. The anterior and middle cerebral vessels are patent proximally. On the right, there is severe stenosis in the M1 segment, 80% or greater. Right M2 and M3 branches show advanced atherosclerotic change with multiple stenoses. Left MCA shows patency of the M1 segment which is diffusely narrowed. There are M2 branch occlusions on the left of indeterminate age. Visualized branches are severely stenotic. Both anterior cerebral arteries are patent proximally. There is occlusion of the right anterior cerebral artery at the A2 level. Both vertebral arteries are patent to the basilar. No basilar stenosis. Posterior circulation branch vessels are patent. Atherosclerotic irregularity is seen in the more distal branch vessels, particularly within the PCA branches. IMPRESSION: Acute infarction affecting the left basal  ganglia. No hemorrhage or swelling in that region at this time. Subacute 1.6 cm hematoma in the left posterior temporal lobe with mild surrounding edema. This probably relates to a hemorrhagic infarction. Extensive chronic small-vessel ischemic changes throughout the brain. Old right MCA territory infarction. MR angiography shows numerous intracranial stenoses. Most notably, there is an 80% or greater stenosis of the right M1 segment. There are multiple occluded left M2 branches, with the residual visualized branches showing advanced atherosclerotic narrowing and irregularity. The exact age of the occlusions on the left is unknown. Electronically Signed   By: Nelson Chimes M.D.   On: 08/19/2018 18:54   Cardiac studies: EKG 08/18/2018: Atrial fibrillation rapid ventricular rate.  Normal axis.  Normal conduction.  No ischemic changes.  Echocardiogram 08/20/2018: - Left ventricle: The cavity size was normal. Wall thickness was   increased in a pattern of mild LVH. Systolic function was normal.   The estimated ejection fraction was in the range of 50% to 55%.   Wall motion was normal; there were no regional wall motion   abnormalities. - Aortic valve: Mildly calcified annulus. Trileaflet. There was   mild regurgitation. - Mitral valve: There was trivial regurgitation. - Left atrium: The atrium was moderately dilated. - Right ventricle: Systolic function was mildly reduced. - Right atrium: Central venous pressure (est): 3 mm Hg. - Atrial septum: No defect or patent foramen ovale was identified. - Tricuspid valve: There was physiologic regurgitation. - Pulmonary arteries: PA peak pressure: 26 mm Hg (S). - Pericardium, extracardiac: There was no pericardial effusion.  Review of Systems  Unable to  perform ROS: Patient nonverbal   Blood pressure (!) 144/68, pulse 78, temperature (!) 97.5 F (36.4 C), temperature source Axillary, resp. rate (!) 28, weight 108.5 kg, last menstrual period 03/31/2013,  SpO2 100 %. Physical Exam  Nursing note and vitals reviewed. Constitutional: She appears well-developed and well-nourished. No distress.  Eyes: Pupils are equal, round, and reactive to light. Conjunctivae are normal.  Neck: Normal range of motion. Neck supple. No JVD present.  Cardiovascular: Normal rate, normal heart sounds and intact distal pulses.  No murmur heard. Irregularly irregular rhythm  Respiratory: Effort normal and breath sounds normal. She has no wheezes. She has no rales.  GI: Soft. Bowel sounds are normal. There is no tenderness. There is no rebound.  Neurological:  Awake, alert. Fixed gaze. No detailed neurological exam performed  Skin: Skin is warm and dry.    Assessment:  77 year old female with hypertension, hyperlipidemia, type II diabetes mellitus, history of prior TIA and CVA, now persistent atrial fibrillation, admitted with acute CVA  Acute CVA Persistent Afib:  CHA2DSVASc score 7, annual stroke risk 11.2% Rate controlled, ranging from 40s to 80s.  Hypertension Type 2 DM  Recommendations: Continue diltiazem po 30 mg bid. No severe brady or tachyarrhythmias. Continue rate control therapy.   Anticoagulation currently on hold due to hemorrhagic infarct. She remains at high risk of thromboembolic stroke risk. Recommend anticoagulation deemed safe by Neurology.   Rest of the management per the primary team.   [place] Sarinana J Darien Mignogna 08/21/2018, 10:07 AM   Nigel Mormon, MD [facility] [place] Pager: [phone] Office: [phone] If no answer Cell [phone]

## 2018-08-21 NOTE — Progress Notes (Signed)
Dr Leonel Ramsay notified at 1845 by day RN Lattie Haw that patient has been nonverbal,  L gaze preference, no longer consistently following commands throughout day, and mostly only moving R side to pain. Daughter concerned and felt this to be a change for her mother.  Dr Leonel Ramsay came to bedside and assessed patient. Only command she would follow is wiggling L toes but has remained nonverbal. Current NIH 26. No new orders received. Will continue to monitor.

## 2018-08-21 NOTE — Progress Notes (Signed)
STROKE TEAM PROGRESS NOTE   SUBJECTIVE (INTERVAL HISTORY) Her speech is improved, can answer questions, baseline dementia.has passed swallow eval OBJECTIVE Vitals:   08/21/18 0900 08/21/18 1000 08/21/18 1100 08/21/18 1300  BP: (!) 153/90 (!) 151/120 (!) 168/141 (!) 159/98  Pulse: (!) 48 78 80   Resp: (!) 23 15 (!) 31 15  Temp:    (!) 97.5 F (36.4 C)  TempSrc:    Axillary  SpO2: 100% 94% 99%   Weight:        CBC:  Recent Labs  Lab 08/18/18 1715 08/18/18 1731 08/21/18 0226  WBC 5.9  --  4.7  NEUTROABS 2.9  --   --   HGB 14.2 13.9 13.1  HCT 41.2 41.0 39.5  MCV 100.5*  --  100.8*  PLT 213  --  073    Basic Metabolic Panel:  Recent Labs  Lab 08/18/18 1715 08/18/18 1731 08/21/18 0226  NA 139 141 141  K 4.2 4.2 3.8  CL 108 108 112*  CO2 25  --  23  GLUCOSE 96 90 97  BUN 19 20 9   CREATININE 1.26* 1.30* 0.79  CALCIUM 8.7*  --  8.3*    Lipid Panel:     Component Value Date/Time   CHOL 198 08/19/2018 0300   TRIG 52 08/19/2018 0300   HDL 45 08/19/2018 0300   CHOLHDL 4.4 08/19/2018 0300   VLDL 10 08/19/2018 0300   LDLCALC 143 (H) 08/19/2018 0300   HgbA1c:  Lab Results  Component Value Date   HGBA1C 5.5 08/19/2018   Urine Drug Screen:     Component Value Date/Time   LABOPIA NONE DETECTED 05/11/2015 0106   COCAINSCRNUR NONE DETECTED 05/11/2015 0106   LABBENZ NONE DETECTED 05/11/2015 0106   AMPHETMU NONE DETECTED 05/11/2015 0106   THCU NONE DETECTED 05/11/2015 0106   LABBARB NONE DETECTED 05/11/2015 0106    Alcohol Level     Component Value Date/Time   ETH <10 08/18/2018 1715    IMAGING  Ct Angio Head W Or Wo Contrast Ct Angio Neck W Or Wo Contrast 08/18/2018 IMPRESSION:  1. Extensive severe intracranial atherosclerotic change as detailed above, progressed relative to previous MRA from 2016.  2. Severe near occlusive proximal left M2 stenosis, superior division. Additional severe M2 stenoses involving the inferior divisions bilaterally.  3.  Short-segment severe mid right M1 stenosis.  4. Severe mid right A2 stenosis with essentially occlusion of the right ACA distally. This is progressed from previous.  5. Short-segment atheromatous stenosis of approximately 60% at the proximal right ICA.  6. Widely patent vertebrobasilar system. Moderate atheromatous change within the P2 segments bilaterally.   Dg Chest Port 1 View 08/19/2018 IMPRESSION:  Low lung volumes with cardiomegaly and scattered atelectasis.    Ct Head Code Stroke Wo Contrast 08/18/2018 IMPRESSION:  No acute finding by CT. Extensive chronic small-vessel ischemic changes throughout the brain. Old infarction in the right basal ganglia and at the right parietal cortical and subcortical brain.    MRI / MRA Head WO Contrast  08/19/2018 IMPRESSION: Acute infarction affecting the left basal ganglia. No hemorrhage or swelling in that region at this time. Subacute 1.6 cm hematoma in the left posterior temporal lobe with mild surrounding edema. This probably relates to a hemorrhagic infarction. Extensive chronic small-vessel ischemic changes throughout the brain. Old right MCA territory infarction. MR angiography shows numerous intracranial stenoses. Most notably, there is an 80% or greater stenosis of the right M1 segment. There are multiple occluded left M2  branches, with the residual visualized branches showing advanced atherosclerotic narrowing and irregularity. The exact age of the occlusions on the left is unknown.   Transthoracic Echocardiogram  08/20/2018 Study Conclusions - Left ventricle: The cavity size was normal. Wall thickness was   increased in a pattern of mild LVH. Systolic function was normal.   The estimated ejection fraction was in the range of 50% to 55%.   Wall motion was normal; there were no regional wall motion   abnormalities. - Aortic valve: Mildly calcified annulus. Trileaflet. There was   mild regurgitation. - Mitral valve: There was trivial  regurgitation. - Left atrium: The atrium was moderately dilated. - Right ventricle: Systolic function was mildly reduced. - Right atrium: Central venous pressure (est): 3 mm Hg. - Atrial septum: No defect or patent foramen ovale was identified. - Tricuspid valve: There was physiologic regurgitation. - Pulmonary arteries: PA peak pressure: 26 mm Hg (S). - Pericardium, extracardiac: There was no pericardial effusion.    repeat CT head 08/21/18: evolving left basal ganglia infarct and hemorrhagic left temporal infarct no new or acute finding    PHYSICAL EXAM  Temp:  [97.5 F (36.4 C)-97.9 F (36.6 C)] 97.5 F (36.4 C) (09/16 1300) Pulse Rate:  [43-173] 80 (09/16 1100) Resp:  [10-36] 15 (09/16 1300) BP: (123-187)/(65-141) 159/98 (09/16 1300) SpO2:  [85 %-100 %] 99 % (09/16 1100)  General -elderly lady well developed, in no apparent distress.  Ophthalmologic - fundi not visualized due to noncooperation.  Cardiovascular - irregularly irregular heart rate and rhythm. No murmur or gallop  Neuro -awake, alert, eyes open, able to track, follows simple commands, can answer simple questions.  No significant gaze preference, able to attend both sides, PERRL, blinking to visual threat bilaterally.  Right facial droop, tongue midline in mouth.  On pain stimulation bilateral upper extremity 3-/5.  Left lower extremity 3-/5, right lower extremity 1+/5.  PR 1+, no Babinski. Sensation, coordination and gait not tested.   ASSESSMENT/PLAN [name] is a 77 y.o. female with history of previous strokes with residual left-sided weakness, vertigo, atrial fibrillation without anticoagulation, obesity, anemia, hypertension, hyperlipidemia, hard of hearing, diabetes mellitus, dementia, asthma, and history of DVT, presenting with possible seizure, eye deviation, aphasia, and right-sided weakness. IV tPA per tele neurology at [facility] - Fri 08/18/2018 at 1745  Stroke: Left MCA stroke due to left M2  occlusion, embolic pattern, likely due to A. fib not on anticoagulation.  Resultant right facial droop, right lower extremity weakness, global aphasia  CT head - no acute findings. Old infarcts rt basal ganglia and rt parietal cortical and subcortical brain.   CTA head and neck - left M2 inferior and superior branches near occlusion  MRI head - Acute infarction affecting the left basal ganglia. Subacute 1.6 cm hematoma in the left posterior temporal lobe with mild surrounding edema. (See Dr Yvetta Coder note from yesterday below)  MRA head - numerous intracranial stenoses - 80% or greater stenosis of the right M1 segment - multiple occluded left M2 branches.  UDS - pending  2D Echo  - EF 50 - 55%. No cardiac source of emboli identified.   LDL - 143  EEG - pending  HgbA1c - 5.5  VTE prophylaxis - SCDs  Diet - dysphagia 1 and thin liquid  No antithrombotic prior to admission, now on No antithrombotic within 24 hours of TPA -> hematoma. Cannot be on Eliquis due to vaginal bleeding.  Patient will be counseled to be compliant with  her antithrombotic medications  Ongoing aggressive stroke risk factor management  Therapy recommendations:  pending  Disposition:  Pending  Chronic A. Fib  Started on Eliquis in 05/2015 after stroke  Continued with adequate at least through 04/2016  However, Eliquis was not listed in her home medication on this admission    she stopped Eliquis due to vaginal bleeding  History of stroke  07/2014 -stroke put on aspirin  05/2015, admitted left arm weakness, put on Eliquis.  She did have post menopausal vaginal bleeding, following with GYN, put on IUD.  Discussed with GYN, okay to start anticoagulation  04/2016, admitted for dysarthria and dysphasia at any pain, concerning for subcortical infarct, recommended add aspirin onto Eliquis for 1 months and then Eliquis alone.  Stopped eliquis due to vaginal bleeding.  ?? Seizure activity on  presentation  No seizure activity since admission  Seizure precaution  Hypertension  Stable . SBP goal S/P TPA - <180/105 mmHg . Long-term BP goal normotensive  Hyperlipidemia  Lipid lowering medication PTA: none  LDL 143, goal < 70  Add Lipitor 40  Continue statin at discharge  Diabetes  HgbA1c 5.5, goal < 7.0  Controlled  SSI  CBG monitoring  Other Stroke Risk Factors  Advanced age  Former cigarette smoker - quit  Obesity, Body mass index is 46.72 kg/m., recommend weight loss, diet and exercise as appropriate   Family hx stroke (paternal grandmother)  Other Active Problems Creatinine 1.30 - -0.79  Hospital day # 3  Personally examined patient and images, and have participated in and made any corrections needed to history, physical, neuro exam,assessment and plan as stated above.  I have personally obtained the history, evaluated lab date, reviewed imaging studies and agree with radiology interpretations. Plan transfer to neurology floor bed today. Repeat CT scan of the head today shows evolving left basal ganglion hemorrhagic temporal lobe infarcts. We'll hold antiplatelet therapy for a few more days. Encourage diet and resume home medications. No family available at the bedside for discussion.This patient is critically ill and at significant risk of neurological worsening, death and care requires constant monitoring of vital signs, hemodynamics,respiratory and cardiac monitoring, extensive review of multiple databases, frequent neurological assessment, discussion with family, other specialists and medical decision making of high complexity.I have made any additions or clarifications directly to the above note.This critical care time does not reflect procedure time, or teaching time or supervisory time of PA/NP/Med Resident etc but could involve care discussion time.  I spent 30 minutes of neurocritical care time  in the care of  this patient.       Antony Contras MD Stroke Neurology   To contact Stroke Continuity provider, please refer to [email or web address] After hours, contact General Neurology

## 2018-08-22 LAB — GLUCOSE, CAPILLARY
Glucose-Capillary: 78 mg/dL (ref 70–99)
Glucose-Capillary: 83 mg/dL (ref 70–99)
Glucose-Capillary: 91 mg/dL (ref 70–99)
Glucose-Capillary: 109 mg/dL — ABNORMAL HIGH (ref 70–99)
Glucose-Capillary: 103 mg/dL — ABNORMAL HIGH (ref 70–99)
Glucose-Capillary: 85 mg/dL (ref 70–99)

## 2018-08-22 NOTE — Progress Notes (Signed)
STROKE TEAM PROGRESS NOTE   SUBJECTIVE (INTERVAL HISTORY) She remains neurologically stable.has passed swallow eval.CT head from yesterday shows stable appearance of the hemorrhagic left posterior temporal infarct and acute left basal ganglia infarct. OBJECTIVE Vitals:   08/22/18 0700 08/22/18 0800 08/22/18 1000 08/22/18 1200  BP: (!) 144/111 (!) 154/106 (!) 154/91 (!) 144/113  Pulse: 83 63 68 79  Resp: 17 18 14  (!) 25  Temp:  98.3 F (36.8 C)  98.4 F (36.9 C)  TempSrc:  Axillary  Axillary  SpO2: 97% 97% 100% 99%  Weight:        CBC:  Recent Labs  Lab 08/18/18 1715 08/18/18 1731 08/21/18 0226  WBC 5.9  --  4.7  NEUTROABS 2.9  --   --   HGB 14.2 13.9 13.1  HCT 41.2 41.0 39.5  MCV 100.5*  --  100.8*  PLT 213  --  893    Basic Metabolic Panel:  Recent Labs  Lab 08/18/18 1715 08/18/18 1731 08/21/18 0226  NA 139 141 141  K 4.2 4.2 3.8  CL 108 108 112*  CO2 25  --  23  GLUCOSE 96 90 97  BUN 19 20 9   CREATININE 1.26* 1.30* 0.79  CALCIUM 8.7*  --  8.3*    Lipid Panel:     Component Value Date/Time   CHOL 198 08/19/2018 0300   TRIG 52 08/19/2018 0300   HDL 45 08/19/2018 0300   CHOLHDL 4.4 08/19/2018 0300   VLDL 10 08/19/2018 0300   LDLCALC 143 (H) 08/19/2018 0300   HgbA1c:  Lab Results  Component Value Date   HGBA1C 5.5 08/19/2018   Urine Drug Screen:     Component Value Date/Time   LABOPIA NONE DETECTED 05/11/2015 0106   COCAINSCRNUR NONE DETECTED 05/11/2015 0106   LABBENZ NONE DETECTED 05/11/2015 0106   AMPHETMU NONE DETECTED 05/11/2015 0106   THCU NONE DETECTED 05/11/2015 0106   LABBARB NONE DETECTED 05/11/2015 0106    Alcohol Level     Component Value Date/Time   ETH <10 08/18/2018 1715    IMAGING  Ct Angio Head W Or Wo Contrast Ct Angio Neck W Or Wo Contrast 08/18/2018 IMPRESSION:  1. Extensive severe intracranial atherosclerotic change as detailed above, progressed relative to previous MRA from 2016.  2. Severe near occlusive proximal  left M2 stenosis, superior division. Additional severe M2 stenoses involving the inferior divisions bilaterally.  3. Short-segment severe mid right M1 stenosis.  4. Severe mid right A2 stenosis with essentially occlusion of the right ACA distally. This is progressed from previous.  5. Short-segment atheromatous stenosis of approximately 60% at the proximal right ICA.  6. Widely patent vertebrobasilar system. Moderate atheromatous change within the P2 segments bilaterally.   Dg Chest Port 1 View 08/19/2018 IMPRESSION:  Low lung volumes with cardiomegaly and scattered atelectasis.    Ct Head Code Stroke Wo Contrast 08/18/2018 IMPRESSION:  No acute finding by CT. Extensive chronic small-vessel ischemic changes throughout the brain. Old infarction in the right basal ganglia and at the right parietal cortical and subcortical brain.    MRI / MRA Head WO Contrast  08/19/2018 IMPRESSION: Acute infarction affecting the left basal ganglia. No hemorrhage or swelling in that region at this time. Subacute 1.6 cm hematoma in the left posterior temporal lobe with mild surrounding edema. This probably relates to a hemorrhagic infarction. Extensive chronic small-vessel ischemic changes throughout the brain. Old right MCA territory infarction. MR angiography shows numerous intracranial stenoses. Most notably, there is an 80%  or greater stenosis of the right M1 segment. There are multiple occluded left M2 branches, with the residual visualized branches showing advanced atherosclerotic narrowing and irregularity. The exact age of the occlusions on the left is unknown.   Transthoracic Echocardiogram  08/20/2018 Study Conclusions - Left ventricle: The cavity size was normal. Wall thickness was   increased in a pattern of mild LVH. Systolic function was normal.   The estimated ejection fraction was in the range of 50% to 55%.   Wall motion was normal; there were no regional wall motion   abnormalities. -  Aortic valve: Mildly calcified annulus. Trileaflet. There was   mild regurgitation. - Mitral valve: There was trivial regurgitation. - Left atrium: The atrium was moderately dilated. - Right ventricle: Systolic function was mildly reduced. - Right atrium: Central venous pressure (est): 3 mm Hg. - Atrial septum: No defect or patent foramen ovale was identified. - Tricuspid valve: There was physiologic regurgitation. - Pulmonary arteries: PA peak pressure: 26 mm Hg (S). - Pericardium, extracardiac: There was no pericardial effusion.    Repeat CT head 08/21/18: evolving left basal ganglia infarct and hemorrhagic left temporal infarct no new or acute finding    PHYSICAL EXAM  Temp:  [97.8 F (36.6 C)-98.5 F (36.9 C)] 98.4 F (36.9 C) (09/17 1200) Pulse Rate:  [49-111] 79 (09/17 1200) Resp:  [12-26] 25 (09/17 1200) BP: (123-181)/(78-114) 144/113 (09/17 1200) SpO2:  [97 %-100 %] 99 % (09/17 1200)  General -elderly lady well developed, in no apparent distress.  Ophthalmologic - fundi not visualized due to noncooperation.  Cardiovascular - irregularly irregular heart rate and rhythm. No murmur or gallop  Neuro -awake, alert, eyes open, able to track, follows simple commands, can answer simple questions.  No significant gaze preference, able to attend both sides, PERRL, blinking to visual threat bilaterally.  Right facial droop, tongue midline in mouth.  On pain stimulation bilateral upper extremity 3-/5.  Left lower extremity 3-/5, right lower extremity 1+/5.  PR 1+, no Babinski. Sensation, coordination and gait not tested.   ASSESSMENT/PLAN [name] is a 77 y.o. female with history of previous strokes with residual left-sided weakness, vertigo, atrial fibrillation without anticoagulation, obesity, anemia, hypertension, hyperlipidemia, hard of hearing, diabetes mellitus, dementia, asthma, and history of DVT, presenting with possible seizure, eye deviation, aphasia, and  right-sided weakness. IV tPA per tele neurology at [facility] - Fri 08/18/2018 at 1745  Stroke: Left MCA stroke due to left M2 occlusion, embolic pattern, likely due to A. fib not on anticoagulation.  Resultant right facial droop, right lower extremity weakness, global aphasia  CT head - no acute findings. Old infarcts rt basal ganglia and rt parietal cortical and subcortical brain.   CTA head and neck - left M2 inferior and superior branches near occlusion  MRI head - Acute infarction affecting the left basal ganglia. Subacute 1.6 cm hematoma in the left posterior temporal lobe with mild surrounding edema. (See Dr Yvetta Coder note from yesterday below)  MRA head - numerous intracranial stenoses - 80% or greater stenosis of the right M1 segment - multiple occluded left M2 branches.  UDS - pending  2D Echo  - EF 50 - 55%. No cardiac source of emboli identified.   LDL - 143  HgbA1c - 5.5  VTE prophylaxis - SCDs  Diet - dysphagia 1 and thin liquid  No antithrombotic prior to admission, now on aspirin 81 mg  . Cannot be on Eliquis due to vaginal bleeding.  Patient will  be counseled to be compliant with her antithrombotic medications  Ongoing aggressive stroke risk factor management  Therapy recommendations:  SNF Disposition:  SNF Chronic A. Fib  Started on Eliquis in 05/2015 after stroke  Continued with adequate at least through 04/2016  However, Eliquis was not listed in her home medication on this admission    she stopped Eliquis due to vaginal bleeding  History of stroke  07/2014 -stroke put on aspirin  05/2015, admitted left arm weakness, put on Eliquis.  She did have post menopausal vaginal bleeding, following with GYN, put on IUD.  Discussed with GYN, okay to start anticoagulation  04/2016, admitted for dysarthria and dysphasia at any pain, concerning for subcortical infarct, recommended add aspirin onto Eliquis for 1 months and then Eliquis alone.  Stopped eliquis due to  vaginal bleeding.  ?? Seizure activity on presentation  No seizure activity since admission  Seizure precaution  Hypertension  Stable . SBP goal S/P TPA - <180/105 mmHg . Long-term BP goal normotensive  Hyperlipidemia  Lipid lowering medication PTA: none  LDL 143, goal < 70  Add Lipitor 40  Continue statin at discharge  Diabetes  HgbA1c 5.5, goal < 7.0  Controlled  SSI  CBG monitoring  Other Stroke Risk Factors  Advanced age  Former cigarette smoker - quit  Obesity, Body mass index is 46.72 kg/m., recommend weight loss, diet and exercise as appropriate   Family hx stroke (paternal grandmother)  Other Active Problems Creatinine 1.30 - -0.79  Hospital day # 4    Plan transfer to neurology floor bed today. Repeat CT scan of the head 08/21/18 shows evolving left basal ganglion hemorrhagic temporal lobe infarcts. Start aspirin. Encourage diet and resume home medications.Start aspirin 81 mg daily for stroke prevention for AFIB. No family available at the bedside for discussion.This patient is critically ill and at significant risk of neurological worsening, death and care requires constant monitoring of vital signs, hemodynamics,respiratory and cardiac monitoring, extensive review of multiple databases, frequent neurological assessment, discussion with family, other specialists and medical decision making of high complexity.I have made any additions or clarifications directly to the above note.This critical care time does not reflect procedure time, or teaching time or supervisory time of PA/NP/Med Resident etc but could involve care discussion time.  I spent 30 minutes of neurocritical care time  in the care of  this patient.       Antony Contras MD Stroke Neurology   To contact Stroke Continuity provider, please refer to [email or web address] After hours, contact General Neurology

## 2018-08-22 NOTE — Clinical Social Work Note (Signed)
Clinical Social Work Assessment  Patient Details  Name: [name] MRN: [id number] Date of Birth: Apr 30, 1941  Date of referral:  08/22/18               Reason for consult:  Discharge Planning                Permission sought to share information with:  Family Supports Permission granted to share information::  Yes, Verbal Permission Granted  Name::     [name]  Agency::  [facility]   Relationship::  daughter  Contact Information:  [phone]  Housing/Transportation Living arrangements for the past 2 months:  [place] of Information:  Other (Comment Required)(spoke with admission coordinator at facility) Patient Interpreter Needed:  None Criminal Activity/Legal Involvement Pertinent to Current Situation/Hospitalization:  No - Comment as needed Significant Relationships:  Adult Children, Community Support Lives with:  Facility Resident Do you feel safe going back to the place where you live?  Yes Need for family participation in patient care:  Yes (Comment)  Care giving concerns:  No family at bedside. CSW was consulted due to patient coming from a SNF facility. CSW looked through patients chart and found out patient is from [facility] assessment / plan:  CSW contacted admission coordinator [name] and she verified patient has been at facility for over 3 years. [name] stated she will be able to take patient back once medically cleared by medical team. [name] stated that patient at baseline is a max assist and is wheel chair bound. CSW will follow up with family to assure they don't have any questions or needs during patients hospitalization.  Employment status:  Retired Nurse, adult PT Recommendations:  [place] / Referral to community resources:  [place]  Patient/Family's Response to care:  Per facility they will need updated  fl2  Patient/Family's Understanding of and Emotional Response to Diagnosis, Current Treatment, and Prognosis:  CSW to follow up with family  Emotional Assessment Appearance:  Appears stated age Attitude/Demeanor/Rapport:  Unable to Assess Affect (typically observed):  Unable to Assess Orientation:  Oriented to Self Alcohol / Substance use:  Not Applicable Psych involvement (Current and /or in the community):  No (Comment)  Discharge Needs  Concerns to be addressed:  Care Coordination Readmission within the last 30 days:  No Current discharge risk:  Dependent with Mobility Barriers to Discharge:  Continued Medical Work up   [employer], LCSW 08/22/2018, 2:41 PM

## 2018-08-22 NOTE — Progress Notes (Signed)
Clinical Social Worker following patient and family for support. CSW reached out to patient daughter [name] to offer support. Rise Paganini stated she is agreeable for patient to discharge back to [facility] but stated she would like patient to be seen by our Palliative Team before being sent back to facility. CSW stated to [facility] she will relay message to medical team of her request. Rise Paganini stated she would like patients to be stable by this coming Saturday since Enigma and her husband will be going on a cruise and will not be available for support.   Rhea Pink, MSW,  [place]

## 2018-08-23 DIAGNOSIS — Z7189 Other specified counseling: Secondary | ICD-10-CM

## 2018-08-23 DIAGNOSIS — I63412 Cerebral infarction due to embolism of left middle cerebral artery: Principal | ICD-10-CM

## 2018-08-23 DIAGNOSIS — Z515 Encounter for palliative care: Secondary | ICD-10-CM

## 2018-08-23 LAB — GLUCOSE, CAPILLARY
Glucose-Capillary: 82 mg/dL (ref 70–99)
Glucose-Capillary: 100 mg/dL — ABNORMAL HIGH (ref 70–99)
Glucose-Capillary: 105 mg/dL — ABNORMAL HIGH (ref 70–99)
Glucose-Capillary: 102 mg/dL — ABNORMAL HIGH (ref 70–99)
Glucose-Capillary: 81 mg/dL (ref 70–99)

## 2018-08-23 MED ORDER — MORPHINE SULFATE (CONCENTRATE) 10 MG/0.5ML PO SOLN: 5.0000 mg | ORAL | Status: DC | PRN

## 2018-08-23 NOTE — Consult Note (Addendum)
            [facility] CM Primary Care Navigator  08/23/2018  [name] 06/06/1941 [id number]   Went to seepatient at the bedside to identify possible discharge needsbut she was fast asleep. No noted family at thebedside.   Per chart review, anticipating patient to return back to skilled nursing facility (SNF) where she has been a resident for over 3 years.  According to Inpatient social worker note, daughter is agreeable for patient to discharge back to skilled [facility], but would like for patient to be seen by Palliative Team before being sent back to the facility.  Noted nofurtherhealth management needs identified atthis point.   For additional questions please contact:  Edwena Felty A. Uniqua Kihn, BSN, RN-BC [facility] PRIMARY CARE Navigator Cell: [phone]

## 2018-08-23 NOTE — Progress Notes (Signed)
Palliative:  Full note to follow. I met with Ms. Badalamenti daughter (legal guardian) and her sister. They both confirm that Ms. Tindol has been clear in her wishes for DNR and no feeding tube. She values QOL, comfort, and dignity. They have decided to transition back to SNF [facility] where she has lived for 3 years (this is home and staff know her) with hospice in place to ensure comfort with expected decline.   Vinie Sill, NP Palliative Medicine Team Pager # [phone] (M-F 8a-5p) Team Phone # [phone] (Nights/Weekends)

## 2018-08-23 NOTE — Consult Note (Signed)
Consultation Note Date: 08/23/2018   Patient Name: [name]  DOB: 1941/08/13  MRN: [id number]  Age / Sex: 77 y.o., female  PCP: Rosita Fire, MD Referring Physician: Garvin Fila, MD  Reason for Consultation: Establishing goals of care and Hospice Evaluation  HPI/Patient Profile: 77 y.o. female  with past medical history of stroke 2017 with residual left sided deficit, chronic back pain, dementia, DM, atrial fibrillation (no anticoagulation), vaginal bleeding, hard of hearing admitted from SNF on 08/18/2018 with new right sided weakness s/t new stroke. Now nonverbal with severe decline in functional status. Passed swallow eval and accepting po intake well but remains high risk for aspiration.   Clinical Assessment and Goals of Care: I met with [name] daughter [name] (legal guardian) and her sister, [name]. They both confirm that [name] has been clear in her wishes for DNR and no feeding tube. She values QOL, comfort, and dignity. They are accepting that she is approaching EOL and are more interested in providing her comfort at this time and do not wish for life prolonging measures.   They have decided to transition back to SNF [facility] where she has lived for 3 years (this is home and staff know her) with hospice in place to ensure comfort with expected decline.   Emotional support provided.   Primary Decision Maker Legal guardian daughter, [name]    SUMMARY OF RECOMMENDATIONS   - DNR - Return to SNF with hospice in place for comfort at [place]:  DNR   Symptom Management:   Pain/SOB: Roxanol prn to ensure comfort.   Palliative Prophylaxis:   Aspiration, Bowel Regimen, Delirium Protocol and Frequent Pain Assessment  Additional Recommendations (Limitations, Scope, Preferences):  Full Comfort Care  Psycho-social/Spiritual:    Desire for further Chaplaincy support:yes  Additional Recommendations: Caregiving  Support/Resources, Education on Hospice and Grief/Bereavement Support  Prognosis:   < 6 months  Discharge Planning: [place] with Hospice      Primary Diagnoses: Present on Admission: . Acute ischemic stroke ([place]) . Stroke (cerebrum) ([place])   I have reviewed the medical record, interviewed the patient and family, and examined the patient. The following aspects are pertinent.  Past Medical History:  Diagnosis Date  . Acute embolism and thombos unsp deep vn unsp lower extremity ([place])   . Anemia   . Arthritis    osteoarthritis  . Asthma   . Back pain, chronic   . Chronic knee pain   . Cognitive communication deficit   . Dementia   . Diabetes mellitus   . Dysphagia   . Dysrhythmia    AFib  . Hemiplegia and hemiparesis following cerebral infarction affecting left non-dominant side ([place])   . HOH (hard of hearing)   . Hyperlipidemia   . Hypertension   . Obesity   . Paroxysmal A-fib ([place])   . Phonological disorder   . Phonological disorder   . PMB (postmenopausal bleeding) 11/26/2014  . Stroke [facility])    Mini Stroke; nleft  sided hemiplegia  . Thickened endometrium 01/15/2015  . TIA (transient ischemic attack)   . Urge incontinence 11/26/2014  . Vertigo    Social History   Socioeconomic History  . Marital status: Widowed    Spouse name: Not on file  . Number of children: 2  . Years of education: Not on file  . Highest education level: Not on file  Occupational History  . Occupation: retired  Scientific laboratory technician  . Financial resource strain: Not on file  . Food insecurity:    Worry: Not on file    Inability: Not on file  . Transportation needs:    Medical: Not on file    Non-medical: Not on file  Tobacco Use  . Smoking status: Former Smoker    Packs/day: 0.25    Years: 30.00    Pack years: 7.50    Types: Cigarettes    Last attempt to quit: 05/20/2015    Years  since quitting: 3.2  . Smokeless tobacco: Never Used  Substance and Sexual Activity  . Alcohol use: No    Alcohol/week: 0.0 standard drinks  . Drug use: No  . Sexual activity: Not Currently    Birth control/protection: Post-menopausal, IUD    Comment: ablation  Lifestyle  . Physical activity:    Days per week: Not on file    Minutes per session: Not on file  . Stress: Not on file  Relationships  . Social connections:    Talks on phone: Not on file    Gets together: Not on file    Attends religious service: Not on file    Active member of club or organization: Not on file    Attends meetings of clubs or organizations: Not on file    Relationship status: Not on file  Other Topics Concern  . Not on file  Social History Narrative  . Not on file   Family History  Problem Relation Age of Onset  . Arrhythmia Father        Atrial fib/Pacemaker  . Heart failure Mother   . Diabetes Mother   . Other Mother        fell and broke hip  . Diabetes Sister   . Lupus Daughter   . Mental illness Son   . ADD / ADHD Son   . Other Son        soft bones  . Diabetes Maternal Grandmother   . Stroke Paternal Grandmother   . Arthritis Paternal Grandfather   . Other Sister        MVA  . Hypertension Brother   . Diabetes Sister   . Hypertension Sister   . Colon cancer Neg Hx    Scheduled Meds: . allopurinol  100 mg Oral Daily  . atorvastatin  40 mg Oral q1800  . diltiazem  30 mg Oral BID  . guaiFENesin  400 mg Oral Q12H  . insulin aspart  0-20 Units Subcutaneous Q4H  . labetalol  20 mg Intravenous Once  . mouth rinse  15 mL Mouth Rinse BID  . mouth rinse  15 mL Mouth Rinse BID  . megestrol  40 mg Oral Daily  . Melatonin  3 mg Oral QHS   Continuous Infusions: . clevidipine    . dextrose 5 % and 0.9% NaCl 75 mL/hr at 08/23/18 1200  . famotidine (PEPCID) IV Stopped (08/23/18 1001)   PRN Meds:.acetaminophen **OR** acetaminophen (TYLENOL) oral liquid 160 mg/5 mL **OR**  acetaminophen, clevidipine, nitroGLYCERIN, senna-docusate Allergies  Allergen Reactions  .  Imitrex [Sumatriptan] Other (See Comments)    Unknown Reaction   . Mango Flavor Other (See Comments)    Nausea and vomiting  . Oysters [Shellfish Allergy] Other (See Comments)    Nausea and vomiting   Review of Systems  Unable to perform ROS: Acuity of condition    Physical Exam  Constitutional: She appears well-developed. She has a sickly appearance.  HENT:  Head: Normocephalic and atraumatic.  Cardiovascular: Normal rate. An irregularly irregular rhythm present.  Pulmonary/Chest: Effort normal. No accessory muscle usage. No tachypnea. No respiratory distress.  Abdominal: Normal appearance.  Neurological: She is alert.  Aphasia  Nursing note and vitals reviewed.   Vital Signs: BP (!) 159/98   Pulse 70   Temp 98.2 F (36.8 C) (Axillary)   Resp 20   Wt 108.5 kg   LMP 03/31/2013   SpO2 100%   BMI 46.72 kg/m  Pain Scale: PAINAD POSS *See Group Information*: S-Acceptable,Sleep, easy to arouse     SpO2: SpO2: 100 % O2 Device:SpO2: 100 % O2 Flow Rate: .O2 Flow Rate (L/min): 1 L/min  IO: Intake/output summary:   Intake/Output Summary (Last 24 hours) at 08/23/2018 1326 Last data filed at 08/23/2018 1200 Gross per 24 hour  Intake 1876.57 ml  Output 1250 ml  Net 626.57 ml    LBM: Last BM Date: 08/21/18 Baseline Weight: Weight: 108.5 kg Most recent weight: Weight: 108.5 kg     Palliative Assessment/Data:     Time In: 1345 Time Out: 1430 Time Total: 45 min Greater than 50%  of this time was spent counseling and coordinating care related to the above assessment and plan.  Signed by: Vinie Sill, NP Palliative Medicine Team Pager # [phone] (M-F 8a-5p) Team Phone # [phone] (Nights/Weekends)

## 2018-08-23 NOTE — Progress Notes (Signed)
STROKE TEAM PROGRESS NOTE   SUBJECTIVE (INTERVAL HISTORY) She remains neurologically stable significant changes. Family is requested palliative care consult which has not yet happened OBJECTIVE Vitals:   08/23/18 1200 08/23/18 1206 08/23/18 1400 08/23/18 1625  BP:  (!) 159/98 (!) 151/88 (!) 161/89  Pulse:  70 69 82  Resp:  20 18 18   Temp: 98.2 F (36.8 C)     TempSrc: Axillary     SpO2:  100% 100% 100%  Weight:        CBC:  Recent Labs  Lab 08/18/18 1715 08/18/18 1731 08/21/18 0226  WBC 5.9  --  4.7  NEUTROABS 2.9  --   --   HGB 14.2 13.9 13.1  HCT 41.2 41.0 39.5  MCV 100.5*  --  100.8*  PLT 213  --  759    Basic Metabolic Panel:  Recent Labs  Lab 08/18/18 1715 08/18/18 1731 08/21/18 0226  NA 139 141 141  K 4.2 4.2 3.8  CL 108 108 112*  CO2 25  --  23  GLUCOSE 96 90 97  BUN 19 20 9   CREATININE 1.26* 1.30* 0.79  CALCIUM 8.7*  --  8.3*    Lipid Panel:     Component Value Date/Time   CHOL 198 08/19/2018 0300   TRIG 52 08/19/2018 0300   HDL 45 08/19/2018 0300   CHOLHDL 4.4 08/19/2018 0300   VLDL 10 08/19/2018 0300   LDLCALC 143 (H) 08/19/2018 0300   HgbA1c:  Lab Results  Component Value Date   HGBA1C 5.5 08/19/2018   Urine Drug Screen:     Component Value Date/Time   LABOPIA NONE DETECTED 05/11/2015 0106   COCAINSCRNUR NONE DETECTED 05/11/2015 0106   LABBENZ NONE DETECTED 05/11/2015 0106   AMPHETMU NONE DETECTED 05/11/2015 0106   THCU NONE DETECTED 05/11/2015 0106   LABBARB NONE DETECTED 05/11/2015 0106    Alcohol Level     Component Value Date/Time   ETH <10 08/18/2018 1715    IMAGING  Ct Angio Head W Or Wo Contrast Ct Angio Neck W Or Wo Contrast 08/18/2018 IMPRESSION:  1. Extensive severe intracranial atherosclerotic change as detailed above, progressed relative to previous MRA from 2016.  2. Severe near occlusive proximal left M2 stenosis, superior division. Additional severe M2 stenoses involving the inferior divisions bilaterally.   3. Short-segment severe mid right M1 stenosis.  4. Severe mid right A2 stenosis with essentially occlusion of the right ACA distally. This is progressed from previous.  5. Short-segment atheromatous stenosis of approximately 60% at the proximal right ICA.  6. Widely patent vertebrobasilar system. Moderate atheromatous change within the P2 segments bilaterally.   Dg Chest Port 1 View 08/19/2018 IMPRESSION:  Low lung volumes with cardiomegaly and scattered atelectasis.    Ct Head Code Stroke Wo Contrast 08/18/2018 IMPRESSION:  No acute finding by CT. Extensive chronic small-vessel ischemic changes throughout the brain. Old infarction in the right basal ganglia and at the right parietal cortical and subcortical brain.    MRI / MRA Head WO Contrast  08/19/2018 IMPRESSION: Acute infarction affecting the left basal ganglia. No hemorrhage or swelling in that region at this time. Subacute 1.6 cm hematoma in the left posterior temporal lobe with mild surrounding edema. This probably relates to a hemorrhagic infarction. Extensive chronic small-vessel ischemic changes throughout the brain. Old right MCA territory infarction. MR angiography shows numerous intracranial stenoses. Most notably, there is an 80% or greater stenosis of the right M1 segment. There are multiple occluded left M2  branches, with the residual visualized branches showing advanced atherosclerotic narrowing and irregularity. The exact age of the occlusions on the left is unknown.   Transthoracic Echocardiogram  08/20/2018 Study Conclusions - Left ventricle: The cavity size was normal. Wall thickness was   increased in a pattern of mild LVH. Systolic function was normal.   The estimated ejection fraction was in the range of 50% to 55%.   Wall motion was normal; there were no regional wall motion   abnormalities. - Aortic valve: Mildly calcified annulus. Trileaflet. There was   mild regurgitation. - Mitral valve: There was  trivial regurgitation. - Left atrium: The atrium was moderately dilated. - Right ventricle: Systolic function was mildly reduced. - Right atrium: Central venous pressure (est): 3 mm Hg. - Atrial septum: No defect or patent foramen ovale was identified. - Tricuspid valve: There was physiologic regurgitation. - Pulmonary arteries: PA peak pressure: 26 mm Hg (S). - Pericardium, extracardiac: There was no pericardial effusion.    Repeat CT head 08/21/18: evolving left basal ganglia infarct and hemorrhagic left temporal infarct no new or acute finding    PHYSICAL EXAM  Temp:  [98.1 F (36.7 C)-98.4 F (36.9 C)] 98.2 F (36.8 C) (09/18 1200) Pulse Rate:  [37-88] 82 (09/18 1625) Resp:  [14-28] 18 (09/18 1625) BP: (126-161)/(77-114) 161/89 (09/18 1625) SpO2:  [98 %-100 %] 100 % (09/18 1625)  General -elderly lady well developed, in no apparent distress.  Ophthalmologic - fundi not visualized due to noncooperation.  Cardiovascular - irregularly irregular heart rate and rhythm. No murmur or gallop  Neuro -awake, alert, eyes open, able to track, follows simple commands, can answer simple questions.  No significant gaze preference, able to attend both sides, PERRL, blinking to visual threat bilaterally.  Right facial droop, tongue midline in mouth.  On pain stimulation bilateral upper extremity 3-/5.  Left lower extremity 3-/5, right lower extremity 1+/5.  PR 1+, no Babinski. Sensation, coordination and gait not tested.   ASSESSMENT/PLAN Ms. KYLEAH PENSABENE is a 77 y.o. female with history of previous strokes with residual left-sided weakness, vertigo, atrial fibrillation without anticoagulation, obesity, anemia, hypertension, hyperlipidemia, hard of hearing, diabetes mellitus, dementia, asthma, and history of DVT, presenting with possible seizure, eye deviation, aphasia, and right-sided weakness. IV tPA per tele neurology at [facility] - Fri 08/18/2018 at 1745  Stroke: Left MCA stroke due to left  M2 occlusion, embolic pattern, likely due to A. fib not on anticoagulation.  Resultant right facial droop, right lower extremity weakness, global aphasia  CT head - no acute findings. Old infarcts rt basal ganglia and rt parietal cortical and subcortical brain.   CTA head and neck - left M2 inferior and superior branches near occlusion  MRI head - Acute infarction affecting the left basal ganglia. Subacute 1.6 cm hematoma in the left posterior temporal lobe with mild surrounding edema. (See Dr Yvetta Coder note from yesterday below)  MRA head - numerous intracranial stenoses - 80% or greater stenosis of the right M1 segment - multiple occluded left M2 branches.  UDS - pending  2D Echo  - EF 50 - 55%. No cardiac source of emboli identified.   LDL - 143  HgbA1c - 5.5  VTE prophylaxis - SCDs  Diet - dysphagia 1 and thin liquid  No antithrombotic prior to admission, now on aspirin 81 mg  . Cannot be on Eliquis due to vaginal bleeding.  Patient will be counseled to be compliant with her antithrombotic medications  Ongoing aggressive stroke risk  factor management  Therapy recommendations:  SNF Disposition:  SNF Chronic A. Fib  Started on Eliquis in 05/2015 after stroke  Continued with adequate at least through 04/2016  However, Eliquis was not listed in her home medication on this admission    she stopped Eliquis due to vaginal bleeding  History of stroke  07/2014 -stroke put on aspirin  05/2015, admitted left arm weakness, put on Eliquis.  She did have post menopausal vaginal bleeding, following with GYN, put on IUD.  Discussed with GYN, okay to start anticoagulation  04/2016, admitted for dysarthria and dysphasia at any pain, concerning for subcortical infarct, recommended add aspirin onto Eliquis for 1 months and then Eliquis alone.  Stopped eliquis due to vaginal bleeding.  ?? Seizure activity on presentation  No seizure activity since admission  Seizure  precaution  Hypertension  Stable . SBP goal S/P TPA - <180/105 mmHg . Long-term BP goal normotensive  Hyperlipidemia  Lipid lowering medication PTA: none  LDL 143, goal < 70  Add Lipitor 40  Continue statin at discharge  Diabetes  HgbA1c 5.5, goal < 7.0  Controlled  SSI  CBG monitoring  Other Stroke Risk Factors  Advanced age  Former cigarette smoker - quit  Obesity, Body mass index is 46.72 kg/m., recommend weight loss, diet and exercise as appropriate   Family hx stroke (paternal grandmother)  Other Active Problems Creatinine 1.30 - -0.79  Hospital day # 5    Plan transfer to neurology floor bed today.Palliative care team consult.continue aspirin may consider anticoagulation in 2 weeks if family wants to be aggressive. Encourage diet and resume home medications. Long discussion with her daughter at the bedside and answered questions. Likely transfer to nursing home in the next few days.This patient is critically ill and at significant risk of neurological worsening, death and care requires constant monitoring of vital signs, hemodynamics,respiratory and cardiac monitoring, extensive review of multiple databases, frequent neurological assessment, discussion with family, other specialists and medical decision making of high complexity.I have made any additions or clarifications directly to the above note.This critical care time does not reflect procedure time, or teaching time or supervisory time of PA/NP/Med Resident etc but could involve care discussion time.  I spent 30 minutes of neurocritical care time  in the care of  this patient.       Antony Contras MD Stroke Neurology   To contact Stroke Continuity provider, please refer to [email or web address] After hours, contact General Neurology

## 2018-08-23 NOTE — NC FL2 (Addendum)
[place] LEVEL OF CARE SCREENING TOOL     IDENTIFICATION  Patient Name: [name] Birthdate: 1941-02-09 Sex: female Admission Date (Current Location): 08/18/2018  [facility] and [place] Number:  Herbalist and Address:  [facility] [place]. [facility], [place] [address], [place], [place] [address]      Provider Number: [id number]  Attending Physician Name and Address:  Garvin Fila, MD  Relative Name and Phone Number:  [name], [phone]    Current Level of Care: Hospital Recommended Level of Care: [place] Prior Approval Number:    Date Approved/Denied:   PASRR Number:    Discharge Plan: SNF([facility])    Current Diagnoses: Patient Active Problem List   Diagnosis Date Noted  . Acute ischemic stroke ([place]) 08/18/2018  . Endometrial hyperplasia   . Stroke (cerebrum) ([place]) 04/20/2016  . Slurred speech 04/20/2016  . Right knee pain 01/21/2016  . Spastic hemiplegia affecting nondominant side ([place]) 09/19/2015  . Type 2 diabetes mellitus with other circulatory complications ([place]) 27/74/1287  . Essential hypertension 07/29/2015  . HLD (hyperlipidemia) 07/29/2015  . Complex regional pain syndrome I of upper limb 06/27/2015  . Hemiparesis affecting left side as late effect of cerebrovascular accident ([place]) 06/27/2015  . Cerebral infarction due to embolism of right anterior cerebral artery ([place]) 05/14/2015  . Embolic cerebral infarction ([place]) 05/14/2015  . Cerebral infarction due to unspecified mechanism   . Paroxysmal atrial fibrillation ([facility])   . Stroke ([place])   . Postmenopausal bleeding   . Left-sided weakness 05/10/2015  . CVA (cerebral vascular accident) ([place]) 05/10/2015  . Complex endometrial hyperplasia without atypia 02/11/2015  . Simple endometrial hyperplasia without atypia 02/11/2015  . Change in bowel habits 02/07/2015  . Thickened endometrium 01/15/2015  . PMB (postmenopausal bleeding)  11/26/2014  . Urge incontinence 11/26/2014  . Cerebral embolism with cerebral infarction ([facility]) 07/27/2014  . Left leg weakness 07/26/2014  . Right frontal lobe lesion 07/26/2014  . Hyperlipidemia 03/13/2013  . Sleep apnea 03/13/2013  . New onset atrial fibrillation ([place]) 03/13/2013  . Near syncope 03/13/2013  . Atrial fibrillation ([place]) 03/12/2013  . HYPERCHOLESTEROLEMIA 05/09/2009  . SINUSITIS 05/09/2009  . BUNION, RIGHT FOOT 05/09/2009  . SHINGLES 11/07/2008  . SUPRASPINATUS TENDINITIS 08/28/2008  . DIABETES MELLITUS, CONTROLLED 08/21/2008  . SHOULDER PAIN, LEFT 08/21/2008  . SPRAIN AND STRAIN OF STERNOCLAVICULAR 07/04/2008  . OBESITY, MORBID 02/02/2008  . PROTEINURIA 02/02/2008  . DM (diabetes mellitus), type 2 with complications ([place]) 86/76/7209  . ANEMIA 12/20/2007  . Essential hypertension, benign 12/20/2007  . ASTHMA 12/20/2007  . Unspecified arthropathy, lower leg 12/20/2007    Orientation RESPIRATION BLADDER Height & Weight     Self  Normal External catheter, Incontinent Weight: 239 lb 3.2 oz (108.5 kg) Height:     BEHAVIORAL SYMPTOMS/MOOD NEUROLOGICAL BOWEL NUTRITION STATUS      Incontinent Diet(dsy 1)  AMBULATORY STATUS COMMUNICATION OF NEEDS Skin   Extensive Assist Non-Verbally                         Personal Care Assistance Level of Assistance  Bathing, Feeding, Dressing Bathing Assistance: Maximum assistance Feeding assistance: Limited assistance Dressing Assistance: Maximum assistance     Functional Limitations Info  Sight, Hearing, Speech Sight Info: Adequate Hearing Info: Adequate Speech Info: Impaired    SPECIAL CARE FACTORS FREQUENCY  PT (By licensed PT)  Contractures      Additional Factors Info  Code Status, Allergies Code Status Info: DNR Allergies Info: IMITREX SUMATRIPTAN, MANGO FLAVOR, OYSTERS SHELLFISH ALLERGY           Current Medications (08/23/2018):  This is the current hospital active  medication list Current Facility-Administered Medications  Medication Dose Route Frequency Provider Last Rate Last Dose  . acetaminophen (TYLENOL) tablet 650 mg  650 mg Oral Q4H PRN Garvin Fila, MD       Or  . acetaminophen (TYLENOL) solution 650 mg  650 mg Per Tube Q4H PRN Garvin Fila, MD       Or  . acetaminophen (TYLENOL) suppository 650 mg  650 mg Rectal Q4H PRN Garvin Fila, MD      . allopurinol (ZYLOPRIM) tablet 100 mg  100 mg Oral Daily Garvin Fila, MD   100 mg at 08/23/18 0928  . atorvastatin (LIPITOR) tablet 40 mg  40 mg Oral q1800 Garvin Fila, MD   40 mg at 08/22/18 1705  . clevidipine (CLEVIPREX) infusion 0.5 mg/mL  0-21 mg/hr Intravenous Continuous PRN Antony Contras S, MD      . dextrose 5 %-0.9 % sodium chloride infusion   Intravenous Continuous Garvin Fila, MD 75 mL/hr at 08/23/18 1331    . diltiazem (CARDIZEM) tablet 30 mg  30 mg Oral BID Garvin Fila, MD   30 mg at 08/23/18 0929  . famotidine (PEPCID) IVPB 20 mg premix  20 mg Intravenous Q12H Garvin Fila, MD   Stopped at 08/23/18 1001  . guaiFENesin tablet 400 mg  400 mg Oral Q12H Garvin Fila, MD   400 mg at 08/23/18 0929  . insulin aspart (novoLOG) injection 0-20 Units  0-20 Units Subcutaneous Q4H Garvin Fila, MD   3 Units at 08/20/18 1245  . labetalol (NORMODYNE,TRANDATE) injection 20 mg  20 mg Intravenous Once Garvin Fila, MD      . MEDLINE mouth rinse  15 mL Mouth Rinse BID Garvin Fila, MD   15 mL at 08/23/18 0742  . MEDLINE mouth rinse  15 mL Mouth Rinse BID Garvin Fila, MD   15 mL at 08/23/18 0931  . megestrol (MEGACE) tablet 40 mg  40 mg Oral Daily Garvin Fila, MD   40 mg at 08/23/18 0930  . Melatonin TABS 3 mg  3 mg Oral QHS Garvin Fila, MD   3 mg at 08/20/18 2113  . nitroGLYCERIN (NITROSTAT) SL tablet 0.4 mg  0.4 mg Sublingual Q5 min PRN Garvin Fila, MD      . senna-docusate (Senokot-S) tablet 1 tablet  1 tablet Oral QHS PRN Garvin Fila, MD          Discharge Medications: Please see discharge summary for a list of discharge medications.  Relevant Imaging Results:  Relevant Lab Results:   Additional Information SS#: [id number] return to snf with hospice I have personally examined this patient, reviewed notes, independently viewed imaging studies, participated in medical decision making and plan of care.ROS completed by me personally and pertinent positives fully documented  I have made any additions or clarifications directly to the above note. Agree with note above.    Antony Contras, MD Medical Director [facility] Stroke [facility] Pager: [phone] 08/24/2018 9:41 AM  Wende Neighbors, LCSW

## 2018-08-23 NOTE — Progress Notes (Signed)
Palliative Medicine consult noted. Due to high referral volume, there may be a delay seeing this patient. Please call the Palliative Medicine Team office at [phone] if recommendations are needed in the interim.  Thank you for inviting [place] to see this patient.  Marjie Skiff Nicandro Perrault, RN, BSN, [facility] Palliative Medicine Team 08/23/2018 8:33 AM Office [phone]

## 2018-08-23 NOTE — Progress Notes (Signed)
PT Cancellation Note  Patient Details Name: [name] MRN: [id number] DOB: 10-31-1941   Cancelled Treatment:    Reason Eval/Treat Not Completed: Other (comment).  Spoke with RN who reports pt's family is seeking comfort care and return to her previous SNF.  PT to deferr further therapy to decisions over aggressive treatment vs comfort care which may continue at her SNF.  PT to sign off for now, please re-consult if you believe pt is going to be here longer than anticipated.  OOB encouraged via maxi sky total lift.   Thanks,    [name]. [name], PT, DPT  Acute Rehabilitation [phone] pager #[phone] office   08/23/2018, 4:19 PM

## 2018-08-24 LAB — GLUCOSE, CAPILLARY
Glucose-Capillary: 134 mg/dL — ABNORMAL HIGH (ref 70–99)
Glucose-Capillary: 132 mg/dL — ABNORMAL HIGH (ref 70–99)
Glucose-Capillary: 118 mg/dL — ABNORMAL HIGH (ref 70–99)

## 2018-08-24 MED ORDER — ATORVASTATIN CALCIUM 40 MG PO TABS: 40.0000 mg | ORAL_TABLET | Freq: Every day | ORAL | 0 refills | Status: AC

## 2018-08-24 MED ORDER — MORPHINE SULFATE (CONCENTRATE) 10 MG/0.5ML PO SOLN: 5.0000 mg | ORAL | 0 refills | Status: AC | PRN

## 2018-08-24 MED ORDER — ASPIRIN EC 81 MG PO TBEC: 81.0000 mg | DELAYED_RELEASE_TABLET | Freq: Every day | ORAL | 2 refills | Status: AC

## 2018-08-24 MED ORDER — ORAL CARE MOUTH RINSE: 15.0000 mL | Freq: Two times a day (BID) | OROMUCOSAL | 0 refills | Status: AC

## 2018-08-24 NOTE — Progress Notes (Addendum)
Discharge to: [facility] Anticipated discharge date: 08/24/18 Family notified: Ladon Applebaum, by phone Transportation by: Corey Harold  Report #: [phone], Room B9-2  Nurse to please call daughter, Rise Paganini [phone], when patient picked up by [facility].  CSW signing off.  Laveda Abbe LCSW [phone]

## 2018-08-24 NOTE — Discharge Summary (Addendum)
Stroke Discharge Summary  Patient ID: [name]   MRN: [id number]      DOB: 07-23-1941  Date of Admission: 08/18/2018 Date of Discharge: 08/24/2018  Attending Physician:  Garvin Fila, MD, Stroke MD Consultant(s):    Palliative Care  And Cardiology Patient's PCP:  Rosita Fire, MD  DISCHARGE DIAGNOSIS: Left MCA stroke due to left M2 occlusion due to atrial fibrillation not on anticoagulation status post IV tPA with small hemorrhagic left temporal hematoma.family agreed to DO NOT RESUSCITATE and comfort care and patient being transferred to nursing home with hospice care  Active Problems: Chronic Afib Seizure Hypertension Hyperlipidemia Diabetes Mellitus   Past Medical History:  Diagnosis Date  . Acute embolism and thombos unsp deep vn unsp lower extremity ([place])   . Anemia   . Arthritis    osteoarthritis  . Asthma   . Back pain, chronic   . Chronic knee pain   . Cognitive communication deficit   . Dementia   . Diabetes mellitus   . Dysphagia   . Dysrhythmia    AFib  . Hemiplegia and hemiparesis following cerebral infarction affecting left non-dominant side ([place])   . HOH (hard of hearing)   . Hyperlipidemia   . Hypertension   . Obesity   . Paroxysmal A-fib ([place])   . Phonological disorder   . Phonological disorder   . PMB (postmenopausal bleeding) 11/26/2014  . Stroke [facility])    Mini Stroke; nleft sided hemiplegia  . Thickened endometrium 01/15/2015  . TIA (transient ischemic attack)   . Urge incontinence 11/26/2014  . Vertigo    Past Surgical History:  Procedure Laterality Date  . APPENDECTOMY    . CATARACT EXTRACTION W/PHACO Left 01/16/2013   Procedure: CATARACT EXTRACTION PHACO AND INTRAOCULAR LENS PLACEMENT (IOC);  Surgeon: Elta Guadeloupe T. Gershon Crane, MD;  Location: [facility] ORS;  Service: Ophthalmology;  Laterality: Left;  CDE:14.37  . colonoscopy  2009   Dr. Wilford Corner: 4 hyperplastic polyps removed. Small internal hemorrhoids. Recommended 5 year follow-up  colonoscopy.  . COLONOSCOPY N/A 02/24/2015   Procedure: COLONOSCOPY;  Surgeon: Danie Binder, MD;  Location: [facility] END[facility] SUITE;  Service: Endoscopy;  Laterality: N/A;  1030  . ENDOMETRIAL ABLATION    . ENDOMETRIAL BIOPSY  04/03/2013      . HYSTEROSCOPY W/D&C N/A 04/24/2013   Procedure: SUCTION DILATATION AND CURETTAGE /HYSTEROSCOPY;  Surgeon: Jonnie Kind, MD;  Location: [facility] ORS;  Service: Gynecology;  Laterality: N/A;  . HYSTEROSCOPY W/D&C N/A 04/02/2015   Procedure: UTERINE CURETTAGE, HYSTEROSCOPY;  Surgeon: Florian Buff, MD;  Location: [facility] ORS;  Service: Gynecology;  Laterality: N/A;  . KNEE ARTHROSCOPY WITH MEDIAL MENISECTOMY Left 07/26/2013   Procedure: KNEE ARTHROSCOPY WITH PARTIAL MEDIAL MENISECTOMY;  Surgeon: Sanjuana Kava, MD;  Location: [facility] OR[facility];  Service: Orthopedics;  Laterality: Left;  . OVARY SURGERY      Allergies as of 08/24/2018      Reactions   Imitrex [sumatriptan] Other (See Comments)   Unknown Reaction    Mango Flavor Other (See Comments)   Nausea and vomiting   Oysters [shellfish Allergy] Other (See Comments)   Nausea and vomiting      Medication List    TAKE these medications   acetaminophen 325 MG tablet Commonly known as:  TYLENOL Take 650 mg every 6 (six) hours as needed by mouth for mild pain or fever.   allopurinol 100 MG tablet Commonly known as:  ZYLOPRIM Take 100 mg by mouth daily.  atorvastatin 40 MG tablet Commonly known as:  LIPITOR Take 1 tablet (40 mg total) by mouth daily at 6 PM.   diltiazem 30 MG tablet Commonly known as:  CARDIZEM Take 30 mg 2 (two) times daily by mouth.   guaifenesin 400 MG Tabs tablet Commonly known as:  HUMIBID E Take 400 mg every 12 (twelve) hours by mouth.   guaiFENesin-dextromethorphan 100-10 MG/5ML syrup Commonly known as:  ROBITUSSIN DM Take 5 mLs by mouth every 4 (four) hours as needed for cough.   megestrol 40 MG/ML suspension Commonly known as:  MEGACE Take 1 mL (40 mg total) by mouth daily.    Melatonin 5 MG Caps Take 5 mg at bedtime by mouth.   morphine CONCENTRATE 10 MG/0.5ML Soln concentrated solution Place 0.25 mLs (5 mg total) under the tongue every 3 (three) hours as needed for severe pain or shortness of breath.   mouth rinse Liqd solution 15 mLs by Mouth Rinse route 2 (two) times daily.   nitroGLYCERIN 0.4 MG SL tablet Commonly known as:  NITROSTAT Place 0.4 mg under the tongue every 5 (five) minutes as needed for chest pain.   OXYGEN Inhale 2 L as needed into the lungs (via nasal cannula for shortness of breath or O2 < 92%).   SENNA PLUS 8.6-50 MG tablet Generic drug:  senna-docusate Take 1 tablet 2 (two) times daily by mouth.   Aspirin 81 mg PO Daily    LABORATORY STUDIES CBC    Component Value Date/Time   WBC 4.7 08/21/2018 0226   RBC 3.92 08/21/2018 0226   HGB 13.1 08/21/2018 0226   HCT 39.5 08/21/2018 0226   PLT 180 08/21/2018 0226   MCV 100.8 (H) 08/21/2018 0226   MCH 33.4 08/21/2018 0226   MCHC 33.2 08/21/2018 0226   RDW 12.3 08/21/2018 0226   LYMPHSABS 2.3 08/18/2018 1715   MONOABS 0.7 08/18/2018 1715   EOSABS 0.1 08/18/2018 1715   BASOSABS 0.0 08/18/2018 1715   CMP    Component Value Date/Time   NA 141 08/21/2018 0226   K 3.8 08/21/2018 0226   CL 112 (H) 08/21/2018 0226   CO2 23 08/21/2018 0226   GLUCOSE 97 08/21/2018 0226   BUN 9 08/21/2018 0226   CREATININE 0.79 08/21/2018 0226   CALCIUM 8.3 (L) 08/21/2018 0226   PROT 7.3 08/18/2018 1715   ALBUMIN 3.1 (L) 08/18/2018 1715   AST 19 08/18/2018 1715   ALT 25 08/18/2018 1715   ALKPHOS 56 08/18/2018 1715   BILITOT 0.5 08/18/2018 1715   GFRNONAA >60 08/21/2018 0226   GFRAA >60 08/21/2018 0226   COAGS Lab Results  Component Value Date   INR 1.07 08/18/2018   INR 1.16 05/24/2015   INR 0.98 05/10/2015   Lipid Panel    Component Value Date/Time   CHOL 198 08/19/2018 0300   TRIG 52 08/19/2018 0300   HDL 45 08/19/2018 0300   CHOLHDL 4.4 08/19/2018 0300   VLDL 10 08/19/2018  0300   LDLCALC 143 (H) 08/19/2018 0300   HgbA1C  Lab Results  Component Value Date   HGBA1C 5.5 08/19/2018   Urinalysis    Component Value Date/Time   COLORURINE YELLOW 05/27/2015 0240   APPEARANCEUR HAZY (A) 05/27/2015 0240   LABSPEC 1.025 05/27/2015 0240   PHURINE 7.0 05/27/2015 0240   GLUCOSEU NEGATIVE 05/27/2015 0240   HGBUR SMALL (A) 05/27/2015 0240   HGBUR negative 02/26/2009 0949   BILIRUBINUR NEGATIVE 05/27/2015 0240   KETONESUR NEGATIVE 05/27/2015 0240   PROTEINUR 30 (  A) 05/27/2015 0240   UROBILINOGEN 1.0 05/27/2015 0240   NITRITE NEGATIVE 05/27/2015 0240   LEUKOCYTESUR NEGATIVE 05/27/2015 0240   Urine Drug Screen     Component Value Date/Time   LABOPIA NONE DETECTED 05/11/2015 0106   COCAINSCRNUR NONE DETECTED 05/11/2015 0106   LABBENZ NONE DETECTED 05/11/2015 0106   AMPHETMU NONE DETECTED 05/11/2015 0106   THCU NONE DETECTED 05/11/2015 0106   LABBARB NONE DETECTED 05/11/2015 0106    Alcohol Level    Component Value Date/Time   ETH <10 08/18/2018 1715     SIGNIFICANT DIAGNOSTIC STUDIES Ct Angio Head W Or Wo Contrast Ct Angio Neck W Or Wo Contrast 08/18/2018 IMPRESSION:  1. Extensive severe intracranial atherosclerotic change as detailed above, progressed relative to previous MRA from 2016.  2. Severe near occlusive proximal left M2 stenosis, superior division. Additional severe M2 stenoses involving the inferior divisions bilaterally.  3. Short-segment severe mid right M1 stenosis.  4. Severe mid right A2 stenosis with essentially occlusion of the right ACA distally. This is progressed from previous.  5. Short-segment atheromatous stenosis of approximately 60% at the proximal right ICA.  6. Widely patent vertebrobasilar system. Moderate atheromatous change within the P2 segments bilaterally.   Dg Chest Port 1 View 08/19/2018 IMPRESSION:  Low lung volumes with cardiomegaly and scattered atelectasis.    Ct Head Code Stroke Wo  Contrast 08/18/2018 IMPRESSION:  No acute finding by CT. Extensive chronic small-vessel ischemic changes throughout the brain. Old infarction in the right basal ganglia and at the right parietal cortical and subcortical brain.    MRI / MRA Head WO Contrast  08/19/2018 IMPRESSION: Acute infarction affecting the left basal ganglia. No hemorrhage or swelling in that region at this time. Subacute 1.6 cm hematoma in the left posterior temporal lobe with mild surrounding edema. This probably relates to a hemorrhagic infarction. Extensive chronic small-vessel ischemic changes throughout the brain. Old right MCA territory infarction. MR angiography shows numerous intracranial stenoses. Most notably, there is an 80% or greater stenosis of the right M1 segment. There are multiple occluded left M2 branches, with the residual visualized branches showing advanced atherosclerotic narrowing and irregularity. The exact age of the occlusions on the left is unknown.   Transthoracic Echocardiogram  08/20/2018 Study Conclusions - Left ventricle: The cavity size was normal. Wall thickness was increased in a pattern of mild LVH. Systolic function was normal. The estimated ejection fraction was in the range of 50% to 55%. Wall motion was normal; there were no regional wall motion abnormalities. - Aortic valve: Mildly calcified annulus. Trileaflet. There was mild regurgitation. - Mitral valve: There was trivial regurgitation. - Left atrium: The atrium was moderately dilated. - Right ventricle: Systolic function was mildly reduced. - Right atrium: Central venous pressure (est): 3 mm Hg. - Atrial septum: No defect or patent foramen ovale was identified. - Tricuspid valve: There was physiologic regurgitation. - Pulmonary arteries: PA peak pressure: 26 mm Hg (S). - Pericardium, extracardiac: There was no pericardial effusion.    Repeat CT head 08/21/18: evolving left basal ganglia infarct and  hemorrhagic left temporal infarct no new or acute finding  HISTORY OF PRESENT ILLNESS KEILYN HAGGARD is an 77 y.o. female with a history of stroke in 2017 with residual left sided deficits who presented to the ED at [facility] on Friday after a possible seizure. LKN was 1515. EMS noted eye deviation at the time of arrival. She was nonverbal and not following commands. CBG was 100 en route to the  ED.   On arrival to the ED it was felt that new right sided weakness was more likely to be due to an acute stroke and a Code Stroke was called. CT head revealed no acute finding; extensive chronic small-vessel ischemic changes throughout the brain, an old infarction in the right basal ganglia and at the right parietal cortical and subcortical brain were noted.Teleneurology was consulted. She was diagnosed with an acute stroke and tPA was administered. Her mRS is 4-5 which precluded endovascular intervention.   CTA of head and neck showed extensive severe intracranial atherosclerotic change, progressed relative to previous MRA from 2016. Findings included severe near occlusive proximal left M2 stenosis of superior division, additional severe M2 stenoses involving the inferior divisions bilaterally, short-segment severe mid right M1 stenosis, severe mid right A2 stenosis with essentially occlusion of the right ACA distally which had progressed from previous scan, and short-segment atheromatous stenosis of approximately 60% at the proximal right ICA. Noted was a widely patent vertebrobasilar system; moderate atheromatous change within the P2 segments was noted bilaterally.  After tPA, she was transferred to [facility] for further stroke work up.  The patient has a diagnosis of dementia. PMHx also includes DM, atrial fibrillation not on blood thinner, vaginal bleeding in July 7/19, HLD and HTN.  At baseline the patient is able to communicate and "move all extremities".   HOSPITAL COURSE Ms. CARRIN VANNOSTRAND is a 77  y.o. female with history of previous strokes with residual left-sided weakness, vertigo, atrial fibrillation without anticoagulation, obesity, anemia, hypertension, hyperlipidemia, hard of hearing, diabetes mellitus, dementia, asthma, and history of DVT, presenting with possible seizure, eye deviation, aphasia, and right-sided weakness. IV tPA per tele neurology at [facility] - Fri 08/18/2018 at 1745  Stroke: Left MCA stroke due to left M2 occlusion, embolic pattern, likely due to A. fib not on anticoagulation.  Resultant right facial droop, right lower extremity weakness, global aphasia  CT head - no acute findings. Old infarcts rt basal ganglia and rt parietal cortical and subcortical brain.   CTA head and neck - left M2 inferior and superior branches near occlusion  MRI head - Acute infarction affecting the left basal ganglia. Subacute 1.6 cm hematoma in the left posterior temporal lobe with mild surrounding edema. (See Dr Yvetta Coder note from yesterday below)  MRA head - numerous intracranial stenoses - 80% or greater stenosis of the right M1 segment - multiple occluded left M2 branches.  UDS - pending  2D Echo  - EF 50 - 55%. No cardiac source of emboli identified.   LDL - 143  HgbA1c - 5.5  VTE prophylaxis - SCDs  Diet - dysphagia 1 and thin liquid  No antithrombotic prior to admission, now on aspirin 81 mg  . Cannot be on Eliquis due to vaginal bleeding.  Patient will be counseled to be compliant with her antithrombotic medications  Ongoing aggressive stroke risk factor management  Therapy recommendations:  SNF Disposition: SNF [facility] with Hospice:  Palliative care was consulted during admission and in discussion with patient's daughter and her sister: they expressed that patient has been clear with her wishes for DNR and no feeding tube.   They decided for patient to return back to her home SNF under hospice care  Chronic A. Fib  Started on Eliquis in 05/2015 after  stroke  Continued with adequate at least through 04/2016  However, Eliquis was not listed in her home medication on this admission    she stopped Eliquis due to  vaginal bleeding  History of stroke  07/2014 -stroke put on aspirin  05/2015, admitted left arm weakness, put on Eliquis.  She did have post menopausal vaginal bleeding, following with GYN, put on IUD.  Discussed with GYN, okay to start anticoagulation  04/2016, admitted for dysarthria and dysphasia at any pain, concerning for subcortical infarct, recommended add aspirin onto Eliquis for 1 months and then Eliquis alone.  Stopped eliquis due to vaginal bleeding.  ?? Seizure activity on presentation  No seizure activity since admission  Seizure precaution  Hypertension  Stable  SBP goal S/P TPA - <180/105 mmHg  Long-term BP goal normotensive  Hyperlipidemia  Lipid lowering medication PTA: none  LDL 143, goal < 70  Added Lipitor 40 while in hospital  Continue statin at discharge  Diabetes  HgbA1c 5.5, goal < 7.0  Controlled  Continue SSI  CBG monitoring  Other Stroke Risk Factors  Advanced age  Former cigarette smoker - quit  Obesity, Body mass index is 46.72 kg/m.,   Family hx stroke (paternal grandmother)  DISCHARGE EXAM Blood pressure 127/81, pulse 78, temperature 98.5 F (36.9 C), temperature source Oral, resp. rate 18, height 5' (1.524 m), weight 108.5 kg, last menstrual period 03/31/2013, SpO2 100 %. General -elderly lady well developed in bed Cardiovascular - irregularly irregular heart rate and rhythm. No murmur or gallop  Neuro -lethargic and sleeping, easily arousable, No answering questions at this time. PERRL, blinking to visual threat bilaterally.  Right facial droop, tongue midline in mouth.  On pain stimulation bilateral upper extremity 3-/5.  Left lower extremity 3-/5, right lower extremity 1+/5.  PR 1+, no Babinski. Withdraws left leg to sensation more than right.  Sensation, coordination and gait not tested.  Discharge Diet    Diet Order            Diet - low sodium heart healthy        DIET - DYS 1 Room service appropriate? Yes; Fluid consistency: Thin  Diet effective now             liquids  DISCHARGE PLAN  Disposition: SNF [facility] with Hospice  aspirin 81 mg daily for secondary stroke prevention.  Ongoing risk factor control by Primary Care Physician at time of discharge  Follow-up Rosita Fire, MD in 2 weeks.  Follow-up in [facility] in 4 weeks, office to schedule an appointment.   45 minutes were spent preparing discharge.   Lowella Petties Nyanor 08/24/18 11:29 AM  I have personally examined this patient, reviewed notes, independently viewed imaging studies, participated in medical decision making and plan of care.ROS completed by me personally and pertinent positives fully documented  I have made any additions or clarifications directly to the above note. Agree with note above.  Antony Contras, MD Medical Director [facility] Pager: [phone] 08/24/2018 1:18 PM

## 2018-08-24 NOTE — Progress Notes (Signed)
Report called to [facility] and patient discharged in stable condition with all belongings, daughter Rise Paganini called to let her know of patient leaving for facility.

## 2018-08-25 DIAGNOSIS — Z7189 Other specified counseling: Secondary | ICD-10-CM

## 2018-08-25 DIAGNOSIS — Z515 Encounter for palliative care: Secondary | ICD-10-CM

## 2018-08-25 DIAGNOSIS — I639 Cerebral infarction, unspecified: Secondary | ICD-10-CM | POA: Diagnosis not present

## 2018-08-28 ENCOUNTER — Other Ambulatory Visit: Payer: Self-pay

## 2018-08-28 NOTE — Patient Outreach (Signed)
[facility]) Care Management  08/28/2018  [name] 03-Dec-1941 [id number]     Transition of Care Referral  Referral Date: 08/28/18 Referral Source: [employer] Discharge Report Date of Admission: 08/18/18 Diagnosis: stroke Date of Discharge: 08/24/18  Facility: [place]: [facility]   Referral received. Upon chart review patient discharged from the hospital to SNF for LTC with hospice care.     Plan: RN CM will close case at this time.    Enzo Montgomery, RN,BSN,CCM [place] Management Telephonic Care Management Coordinator Direct Phone: [phone] Toll Free: [phone] Fax: [phone]

## 2018-08-29 DIAGNOSIS — N939 Abnormal uterine and vaginal bleeding, unspecified: Secondary | ICD-10-CM | POA: Diagnosis not present

## 2018-08-29 DIAGNOSIS — E785 Hyperlipidemia, unspecified: Secondary | ICD-10-CM | POA: Diagnosis not present

## 2018-08-29 DIAGNOSIS — I10 Essential (primary) hypertension: Secondary | ICD-10-CM | POA: Diagnosis not present

## 2018-08-29 DIAGNOSIS — I679 Cerebrovascular disease, unspecified: Secondary | ICD-10-CM | POA: Diagnosis not present

## 2018-08-31 DIAGNOSIS — Z8673 Personal history of transient ischemic attack (TIA), and cerebral infarction without residual deficits: Secondary | ICD-10-CM | POA: Diagnosis not present

## 2018-08-31 DIAGNOSIS — I69920 Aphasia following unspecified cerebrovascular disease: Secondary | ICD-10-CM | POA: Diagnosis not present

## 2018-08-31 DIAGNOSIS — I69913 Psychomotor deficit following unspecified cerebrovascular disease: Secondary | ICD-10-CM | POA: Diagnosis not present

## 2018-08-31 DIAGNOSIS — E785 Hyperlipidemia, unspecified: Secondary | ICD-10-CM | POA: Diagnosis not present

## 2018-08-31 DIAGNOSIS — I69954 Hemiplegia and hemiparesis following unspecified cerebrovascular disease affecting left non-dominant side: Secondary | ICD-10-CM | POA: Diagnosis not present

## 2018-08-31 DIAGNOSIS — F0150 Vascular dementia without behavioral disturbance: Secondary | ICD-10-CM | POA: Diagnosis not present

## 2018-08-31 DIAGNOSIS — I69921 Dysphasia following unspecified cerebrovascular disease: Secondary | ICD-10-CM | POA: Diagnosis not present

## 2018-08-31 DIAGNOSIS — I4891 Unspecified atrial fibrillation: Secondary | ICD-10-CM | POA: Diagnosis not present

## 2018-09-05 DIAGNOSIS — I639 Cerebral infarction, unspecified: Secondary | ICD-10-CM | POA: Diagnosis not present

## 2018-09-22 DIAGNOSIS — I679 Cerebrovascular disease, unspecified: Secondary | ICD-10-CM | POA: Diagnosis not present

## 2018-09-22 DIAGNOSIS — E785 Hyperlipidemia, unspecified: Secondary | ICD-10-CM | POA: Diagnosis not present

## 2018-09-22 DIAGNOSIS — Z8673 Personal history of transient ischemic attack (TIA), and cerebral infarction without residual deficits: Secondary | ICD-10-CM | POA: Diagnosis not present

## 2018-09-22 DIAGNOSIS — I10 Essential (primary) hypertension: Secondary | ICD-10-CM | POA: Diagnosis not present

## 2018-10-11 DIAGNOSIS — J45909 Unspecified asthma, uncomplicated: Secondary | ICD-10-CM | POA: Diagnosis not present

## 2018-10-11 DIAGNOSIS — I679 Cerebrovascular disease, unspecified: Secondary | ICD-10-CM | POA: Diagnosis not present

## 2018-10-11 DIAGNOSIS — I10 Essential (primary) hypertension: Secondary | ICD-10-CM | POA: Diagnosis not present

## 2018-10-25 DIAGNOSIS — E1151 Type 2 diabetes mellitus with diabetic peripheral angiopathy without gangrene: Secondary | ICD-10-CM | POA: Diagnosis not present

## 2018-10-25 DIAGNOSIS — L84 Corns and callosities: Secondary | ICD-10-CM | POA: Diagnosis not present

## 2018-11-01 DIAGNOSIS — I679 Cerebrovascular disease, unspecified: Secondary | ICD-10-CM | POA: Diagnosis not present

## 2018-11-01 DIAGNOSIS — N939 Abnormal uterine and vaginal bleeding, unspecified: Secondary | ICD-10-CM | POA: Diagnosis not present

## 2018-11-01 DIAGNOSIS — I10 Essential (primary) hypertension: Secondary | ICD-10-CM | POA: Diagnosis not present

## 2018-11-04 DIAGNOSIS — F4321 Adjustment disorder with depressed mood: Secondary | ICD-10-CM | POA: Diagnosis not present

## 2018-11-04 DIAGNOSIS — G4700 Insomnia, unspecified: Secondary | ICD-10-CM | POA: Diagnosis not present

## 2018-11-07 DIAGNOSIS — H2511 Age-related nuclear cataract, right eye: Secondary | ICD-10-CM | POA: Diagnosis not present

## 2018-11-07 DIAGNOSIS — Z961 Presence of intraocular lens: Secondary | ICD-10-CM | POA: Diagnosis not present

## 2018-11-07 DIAGNOSIS — E119 Type 2 diabetes mellitus without complications: Secondary | ICD-10-CM | POA: Diagnosis not present

## 2018-11-22 ENCOUNTER — Other Ambulatory Visit: Payer: Self-pay

## 2018-11-22 NOTE — Patient Outreach (Signed)
First attempt to obtain mRs. No answer.[place] to obtain mRs score. I was transferred to the nurses station and nobody picked up. I called back and nobody picked up. Patient has no DPR on file. Will try again later.

## 2018-11-28 ENCOUNTER — Other Ambulatory Visit: Payer: Self-pay

## 2018-11-28 NOTE — Patient Outreach (Signed)
Second attempt to obtain mRs. No answer. Left message for return call.  

## 2018-12-01 ENCOUNTER — Other Ambulatory Visit: Payer: Self-pay

## 2018-12-01 NOTE — Patient Outreach (Signed)
3 outreach attempts were completed to obtain mRs. mRs could not be obtained because patient never returned my calls. MRs=7  [facility] again, where patient resides. I was told the nurse must be with someone because she never picked up the page. They did not want me to leave a message and suggested I call back instead.

## 2018-12-14 DIAGNOSIS — F0150 Vascular dementia without behavioral disturbance: Secondary | ICD-10-CM | POA: Diagnosis not present

## 2018-12-14 DIAGNOSIS — J45909 Unspecified asthma, uncomplicated: Secondary | ICD-10-CM | POA: Diagnosis not present

## 2018-12-14 DIAGNOSIS — I10 Essential (primary) hypertension: Secondary | ICD-10-CM | POA: Diagnosis not present

## 2019-04-06 DEATH — deceased
